# Patient Record
Sex: Female | Born: 1935 | Race: White | Hispanic: No | Marital: Married | Smoking: Never smoker
Health system: Southern US, Community
[De-identification: ages and names within clinical notes are randomized; demographics above are authoritative.]

## PROBLEM LIST (undated history)

## (undated) DIAGNOSIS — N186 End stage renal disease: Secondary | ICD-10-CM

## (undated) DIAGNOSIS — D649 Anemia, unspecified: Secondary | ICD-10-CM

## (undated) DIAGNOSIS — E119 Type 2 diabetes mellitus without complications: Secondary | ICD-10-CM

## (undated) DIAGNOSIS — I10 Essential (primary) hypertension: Secondary | ICD-10-CM

## (undated) DIAGNOSIS — I499 Cardiac arrhythmia, unspecified: Secondary | ICD-10-CM

## (undated) DIAGNOSIS — C4490 Unspecified malignant neoplasm of skin, unspecified: Secondary | ICD-10-CM

## (undated) DIAGNOSIS — J45909 Unspecified asthma, uncomplicated: Secondary | ICD-10-CM

## (undated) DIAGNOSIS — K219 Gastro-esophageal reflux disease without esophagitis: Secondary | ICD-10-CM

## (undated) DIAGNOSIS — I509 Heart failure, unspecified: Secondary | ICD-10-CM

## (undated) DIAGNOSIS — N289 Disorder of kidney and ureter, unspecified: Secondary | ICD-10-CM

## (undated) DIAGNOSIS — R600 Localized edema: Secondary | ICD-10-CM

## (undated) DIAGNOSIS — I4891 Unspecified atrial fibrillation: Secondary | ICD-10-CM

## (undated) DIAGNOSIS — M1990 Unspecified osteoarthritis, unspecified site: Secondary | ICD-10-CM

## (undated) DIAGNOSIS — G250 Essential tremor: Secondary | ICD-10-CM

## (undated) DIAGNOSIS — G4733 Obstructive sleep apnea (adult) (pediatric): Secondary | ICD-10-CM

## (undated) HISTORY — PX: NEUROPLASTY / TRANSPOSITION MEDIAN NERVE AT CARPAL TUNNEL BILATERAL: SUR894

## (undated) HISTORY — PX: FEMUR FRACTURE SURGERY: SHX633

## (undated) HISTORY — PX: TUBAL LIGATION: SHX77

## (undated) HISTORY — PX: JOINT REPLACEMENT: SHX530

## (undated) HISTORY — PX: ANKLE FRACTURE SURGERY: SHX122

## (undated) HISTORY — PX: FRACTURE SURGERY: SHX138

## (undated) HISTORY — PX: VASCULAR SURGERY: SHX849

## (undated) HISTORY — PX: KNEE ARTHROPLASTY: SHX992

## (undated) HISTORY — PX: CHOLECYSTECTOMY: SHX55

## (undated) HISTORY — PX: ABDOMINAL HYSTERECTOMY: SHX81

---

## 1998-08-22 ENCOUNTER — Ambulatory Visit: Admission: RE | Admit: 1998-08-22 | Discharge: 1998-08-22 | Payer: Self-pay | Admitting: Pulmonary Disease

## 1999-02-07 ENCOUNTER — Ambulatory Visit: Admission: RE | Admit: 1999-02-07 | Discharge: 1999-02-07 | Payer: Self-pay | Admitting: Pulmonary Disease

## 1999-09-09 ENCOUNTER — Encounter: Payer: Self-pay | Admitting: Orthopedic Surgery

## 1999-09-14 ENCOUNTER — Inpatient Hospital Stay (HOSPITAL_COMMUNITY): Admission: RE | Admit: 1999-09-14 | Discharge: 1999-09-18 | Payer: Self-pay | Admitting: Orthopedic Surgery

## 1999-09-14 ENCOUNTER — Encounter: Payer: Self-pay | Admitting: Orthopedic Surgery

## 1999-09-18 ENCOUNTER — Inpatient Hospital Stay (HOSPITAL_COMMUNITY)
Admission: RE | Admit: 1999-09-18 | Discharge: 1999-09-24 | Payer: Self-pay | Admitting: Physical Medicine & Rehabilitation

## 1999-10-10 ENCOUNTER — Inpatient Hospital Stay (HOSPITAL_COMMUNITY): Admission: EM | Admit: 1999-10-10 | Discharge: 1999-10-12 | Payer: Self-pay | Admitting: Emergency Medicine

## 2000-09-13 ENCOUNTER — Ambulatory Visit (HOSPITAL_COMMUNITY): Admission: RE | Admit: 2000-09-13 | Discharge: 2000-09-13 | Payer: Self-pay | Admitting: Neurosurgery

## 2000-09-13 ENCOUNTER — Encounter: Payer: Self-pay | Admitting: Neurosurgery

## 2000-09-20 ENCOUNTER — Encounter: Payer: Self-pay | Admitting: Neurosurgery

## 2000-09-20 ENCOUNTER — Ambulatory Visit (HOSPITAL_COMMUNITY): Admission: RE | Admit: 2000-09-20 | Discharge: 2000-09-20 | Payer: Self-pay | Admitting: Neurosurgery

## 2000-09-29 ENCOUNTER — Ambulatory Visit (HOSPITAL_COMMUNITY): Admission: RE | Admit: 2000-09-29 | Discharge: 2000-09-29 | Payer: Self-pay | Admitting: Interventional Radiology

## 2000-10-04 ENCOUNTER — Inpatient Hospital Stay (HOSPITAL_COMMUNITY): Admission: RE | Admit: 2000-10-04 | Discharge: 2000-10-10 | Payer: Self-pay | Admitting: Neurosurgery

## 2000-10-06 ENCOUNTER — Encounter: Payer: Self-pay | Admitting: Neurosurgery

## 2004-03-15 ENCOUNTER — Other Ambulatory Visit: Payer: Self-pay

## 2004-12-31 ENCOUNTER — Ambulatory Visit: Payer: Self-pay | Admitting: Gastroenterology

## 2006-03-24 ENCOUNTER — Ambulatory Visit: Payer: Self-pay | Admitting: Internal Medicine

## 2006-06-01 ENCOUNTER — Ambulatory Visit: Payer: Self-pay | Admitting: Internal Medicine

## 2007-03-27 ENCOUNTER — Ambulatory Visit: Payer: Self-pay | Admitting: Internal Medicine

## 2008-03-28 ENCOUNTER — Ambulatory Visit: Payer: Self-pay | Admitting: Internal Medicine

## 2008-05-01 ENCOUNTER — Ambulatory Visit: Payer: Self-pay

## 2009-02-27 ENCOUNTER — Ambulatory Visit: Payer: Self-pay | Admitting: Nephrology

## 2009-03-31 ENCOUNTER — Ambulatory Visit: Payer: Self-pay | Admitting: Internal Medicine

## 2009-05-01 ENCOUNTER — Ambulatory Visit: Payer: Self-pay | Admitting: Vascular Surgery

## 2009-05-01 ENCOUNTER — Ambulatory Visit: Payer: Self-pay | Admitting: Cardiology

## 2009-05-14 ENCOUNTER — Ambulatory Visit: Payer: Self-pay | Admitting: Vascular Surgery

## 2009-05-19 ENCOUNTER — Emergency Department: Payer: Self-pay | Admitting: Emergency Medicine

## 2009-11-17 ENCOUNTER — Ambulatory Visit: Payer: Self-pay | Admitting: Vascular Surgery

## 2009-12-10 ENCOUNTER — Ambulatory Visit: Payer: Self-pay | Admitting: Ophthalmology

## 2010-04-02 ENCOUNTER — Ambulatory Visit: Payer: Self-pay | Admitting: Internal Medicine

## 2010-09-21 ENCOUNTER — Inpatient Hospital Stay: Payer: Self-pay | Admitting: Internal Medicine

## 2010-12-03 ENCOUNTER — Ambulatory Visit: Payer: Self-pay | Admitting: Internal Medicine

## 2011-04-08 ENCOUNTER — Ambulatory Visit: Payer: Self-pay | Admitting: Internal Medicine

## 2011-06-07 ENCOUNTER — Ambulatory Visit: Payer: Self-pay | Admitting: Vascular Surgery

## 2011-06-14 ENCOUNTER — Ambulatory Visit: Payer: Self-pay | Admitting: Vascular Surgery

## 2011-06-15 ENCOUNTER — Ambulatory Visit: Payer: Self-pay | Admitting: Vascular Surgery

## 2011-07-16 ENCOUNTER — Ambulatory Visit: Payer: Self-pay | Admitting: Vascular Surgery

## 2012-04-11 ENCOUNTER — Ambulatory Visit: Payer: Self-pay | Admitting: Internal Medicine

## 2012-04-12 ENCOUNTER — Ambulatory Visit: Payer: Self-pay | Admitting: Internal Medicine

## 2012-06-19 ENCOUNTER — Ambulatory Visit: Payer: Self-pay | Admitting: Vascular Surgery

## 2012-09-08 ENCOUNTER — Observation Stay: Payer: Self-pay | Admitting: Internal Medicine

## 2012-09-08 LAB — COMPREHENSIVE METABOLIC PANEL
Albumin: 3.4 g/dL (ref 3.4–5.0)
Alkaline Phosphatase: 147 U/L — ABNORMAL HIGH (ref 50–136)
Anion Gap: 7 (ref 7–16)
BUN: 36 mg/dL — ABNORMAL HIGH (ref 7–18)
Bilirubin,Total: 1 mg/dL (ref 0.2–1.0)
Calcium, Total: 8.9 mg/dL (ref 8.5–10.1)
Chloride: 103 mmol/L (ref 98–107)
Co2: 30 mmol/L (ref 21–32)
Creatinine: 6.18 mg/dL — ABNORMAL HIGH (ref 0.60–1.30)
EGFR (African American): 7 — ABNORMAL LOW
EGFR (Non-African Amer.): 6 — ABNORMAL LOW
Glucose: 189 mg/dL — ABNORMAL HIGH (ref 65–99)
Osmolality: 293 (ref 275–301)
Potassium: 4.9 mmol/L (ref 3.5–5.1)
SGOT(AST): 16 U/L (ref 15–37)
SGPT (ALT): 17 U/L (ref 12–78)
Sodium: 140 mmol/L (ref 136–145)
Total Protein: 7.1 g/dL (ref 6.4–8.2)

## 2012-09-08 LAB — CBC
HCT: 30.9 % — ABNORMAL LOW (ref 35.0–47.0)
HGB: 10.3 g/dL — ABNORMAL LOW (ref 12.0–16.0)
MCH: 36.1 pg — ABNORMAL HIGH (ref 26.0–34.0)
MCHC: 33.2 g/dL (ref 32.0–36.0)
MCV: 109 fL — ABNORMAL HIGH (ref 80–100)
Platelet: 183 10*3/uL (ref 150–440)
RBC: 2.85 10*6/uL — ABNORMAL LOW (ref 3.80–5.20)
RDW: 14.1 % (ref 11.5–14.5)
WBC: 12.7 10*3/uL — ABNORMAL HIGH (ref 3.6–11.0)

## 2012-09-08 LAB — TROPONIN I
Troponin-I: 0.02 ng/mL
Troponin-I: 0.03 ng/mL

## 2012-09-08 LAB — CK TOTAL AND CKMB (NOT AT ARMC)
CK, Total: 35 U/L (ref 21–215)
CK, Total: 31 U/L (ref 21–215)
CK-MB: 0.7 ng/mL (ref 0.5–3.6)
CK-MB: 0.7 ng/mL (ref 0.5–3.6)

## 2012-09-09 LAB — LIPID PANEL
Cholesterol: 108 mg/dL (ref 0–200)
HDL Cholesterol: 29 mg/dL — ABNORMAL LOW (ref 40–60)
Ldl Cholesterol, Calc: 65 mg/dL (ref 0–100)
Triglycerides: 71 mg/dL (ref 0–200)
VLDL Cholesterol, Calc: 14 mg/dL (ref 5–40)

## 2012-09-09 LAB — CBC WITH DIFFERENTIAL/PLATELET
Basophil #: 0.1 10*3/uL (ref 0.0–0.1)
HCT: 29.1 % — ABNORMAL LOW (ref 35.0–47.0)
HGB: 10 g/dL — ABNORMAL LOW (ref 12.0–16.0)
Lymphocyte #: 3.3 10*3/uL (ref 1.0–3.6)
MCH: 37.5 pg — ABNORMAL HIGH (ref 26.0–34.0)
MCHC: 34.3 g/dL (ref 32.0–36.0)
MCV: 109 fL — ABNORMAL HIGH (ref 80–100)
Monocyte #: 0.9 x10 3/mm (ref 0.2–0.9)
Monocyte %: 7.3 %
Neutrophil #: 7.2 10*3/uL — ABNORMAL HIGH (ref 1.4–6.5)
Platelet: 164 10*3/uL (ref 150–440)
RBC: 2.67 10*6/uL — ABNORMAL LOW (ref 3.80–5.20)
RDW: 13.6 % (ref 11.5–14.5)

## 2012-09-09 LAB — COMPREHENSIVE METABOLIC PANEL
Alkaline Phosphatase: 124 U/L (ref 50–136)
BUN: 45 mg/dL — ABNORMAL HIGH (ref 7–18)
Bilirubin,Total: 1 mg/dL (ref 0.2–1.0)
Calcium, Total: 8.2 mg/dL — ABNORMAL LOW (ref 8.5–10.1)
Creatinine: 6.89 mg/dL — ABNORMAL HIGH (ref 0.60–1.30)
Glucose: 120 mg/dL — ABNORMAL HIGH (ref 65–99)
Osmolality: 288 (ref 275–301)
Potassium: 4.7 mmol/L (ref 3.5–5.1)
SGPT (ALT): 15 U/L (ref 12–78)
Sodium: 138 mmol/L (ref 136–145)
Total Protein: 6.5 g/dL (ref 6.4–8.2)

## 2012-09-09 LAB — CK TOTAL AND CKMB (NOT AT ARMC)
CK, Total: 25 U/L (ref 21–215)
CK-MB: 0.6 ng/mL (ref 0.5–3.6)

## 2012-11-07 ENCOUNTER — Ambulatory Visit: Payer: Self-pay | Admitting: Internal Medicine

## 2013-04-12 ENCOUNTER — Ambulatory Visit: Payer: Self-pay | Admitting: Internal Medicine

## 2013-09-16 ENCOUNTER — Inpatient Hospital Stay: Payer: Self-pay | Admitting: Internal Medicine

## 2013-09-16 LAB — BASIC METABOLIC PANEL
Anion Gap: 9 (ref 7–16)
Calcium, Total: 8.8 mg/dL (ref 8.5–10.1)
EGFR (African American): 5 — ABNORMAL LOW
EGFR (Non-African Amer.): 5 — ABNORMAL LOW
Glucose: 295 mg/dL — ABNORMAL HIGH (ref 65–99)
Potassium: 4 mmol/L (ref 3.5–5.1)
Sodium: 133 mmol/L — ABNORMAL LOW (ref 136–145)

## 2013-09-16 LAB — CBC
HGB: 10.9 g/dL — ABNORMAL LOW (ref 12.0–16.0)
MCHC: 35.4 g/dL (ref 32.0–36.0)
MCV: 110 fL — ABNORMAL HIGH (ref 80–100)
Platelet: 164 10*3/uL (ref 150–440)
RDW: 14.6 % — ABNORMAL HIGH (ref 11.5–14.5)
WBC: 9.2 10*3/uL (ref 3.6–11.0)

## 2013-09-16 LAB — TSH: Thyroid Stimulating Horm: 1.21 u[IU]/mL

## 2013-09-16 LAB — PROTIME-INR
INR: 1
Prothrombin Time: 13.6 secs (ref 11.5–14.7)

## 2013-09-16 LAB — MAGNESIUM: Magnesium: 1.6 mg/dL — ABNORMAL LOW

## 2013-09-16 LAB — CK TOTAL AND CKMB (NOT AT ARMC)
CK, Total: 44 U/L (ref 21–215)
CK-MB: 0.6 ng/mL (ref 0.5–3.6)
CK-MB: 1.2 ng/mL (ref 0.5–3.6)

## 2013-09-16 LAB — TROPONIN I: Troponin-I: 0.02 ng/mL

## 2013-09-17 LAB — COMPREHENSIVE METABOLIC PANEL
Alkaline Phosphatase: 121 U/L (ref 50–136)
Anion Gap: 11 (ref 7–16)
BUN: 47 mg/dL — ABNORMAL HIGH (ref 7–18)
Bilirubin,Total: 1 mg/dL (ref 0.2–1.0)
Calcium, Total: 8.9 mg/dL (ref 8.5–10.1)
Chloride: 96 mmol/L — ABNORMAL LOW (ref 98–107)
EGFR (African American): 5 — ABNORMAL LOW
Potassium: 3.9 mmol/L (ref 3.5–5.1)
SGOT(AST): 19 U/L (ref 15–37)
SGPT (ALT): 17 U/L (ref 12–78)
Sodium: 133 mmol/L — ABNORMAL LOW (ref 136–145)
Total Protein: 6.6 g/dL (ref 6.4–8.2)

## 2013-09-17 LAB — CBC WITH DIFFERENTIAL/PLATELET
Basophil #: 0.1 10*3/uL (ref 0.0–0.1)
Eosinophil #: 0.4 10*3/uL (ref 0.0–0.7)
HCT: 30.9 % — ABNORMAL LOW (ref 35.0–47.0)
HGB: 10.8 g/dL — ABNORMAL LOW (ref 12.0–16.0)
Lymphocyte #: 2.5 10*3/uL (ref 1.0–3.6)
Lymphocyte %: 18.9 %
MCH: 38.6 pg — ABNORMAL HIGH (ref 26.0–34.0)
MCHC: 34.9 g/dL (ref 32.0–36.0)
Monocyte %: 7.9 %
Neutrophil %: 69.6 %
Platelet: 186 10*3/uL (ref 150–440)
RBC: 2.79 10*6/uL — ABNORMAL LOW (ref 3.80–5.20)
WBC: 13.1 10*3/uL — ABNORMAL HIGH (ref 3.6–11.0)

## 2013-09-17 LAB — PHOSPHORUS: Phosphorus: 4.2 mg/dL (ref 2.5–4.9)

## 2013-11-25 LAB — COMPREHENSIVE METABOLIC PANEL
Albumin: 3.2 g/dL — ABNORMAL LOW (ref 3.4–5.0)
Alkaline Phosphatase: 102 U/L
Bilirubin,Total: 1.2 mg/dL — ABNORMAL HIGH (ref 0.2–1.0)
Co2: 28 mmol/L (ref 21–32)
Creatinine: 7.68 mg/dL — ABNORMAL HIGH (ref 0.60–1.30)
EGFR (African American): 5 — ABNORMAL LOW
Osmolality: 284 (ref 275–301)
Potassium: 3.7 mmol/L (ref 3.5–5.1)
SGOT(AST): 16 U/L (ref 15–37)
SGPT (ALT): 17 U/L (ref 12–78)
Total Protein: 7.2 g/dL (ref 6.4–8.2)

## 2013-11-25 LAB — CBC
MCH: 35 pg — ABNORMAL HIGH (ref 26.0–34.0)
MCHC: 31.6 g/dL — ABNORMAL LOW (ref 32.0–36.0)
MCV: 111 fL — ABNORMAL HIGH (ref 80–100)
Platelet: 159 10*3/uL (ref 150–440)

## 2013-11-25 LAB — PROTIME-INR
INR: 2.1
Prothrombin Time: 23.4 secs — ABNORMAL HIGH (ref 11.5–14.7)

## 2013-11-25 LAB — TROPONIN I: Troponin-I: 0.05 ng/mL

## 2013-11-26 LAB — CBC WITH DIFFERENTIAL/PLATELET
Basophil %: 0.9 %
Eosinophil %: 2.7 %
HCT: 29.4 % — ABNORMAL LOW (ref 35.0–47.0)
HGB: 9.9 g/dL — ABNORMAL LOW (ref 12.0–16.0)
Lymphocyte #: 2.3 10*3/uL (ref 1.0–3.6)
Lymphocyte %: 23.9 %
MCH: 37.6 pg — ABNORMAL HIGH (ref 26.0–34.0)
MCHC: 33.7 g/dL (ref 32.0–36.0)
MCV: 112 fL — ABNORMAL HIGH (ref 80–100)
Monocyte #: 0.8 x10 3/mm (ref 0.2–0.9)
Monocyte %: 8.7 %
Neutrophil %: 63.8 %
RBC: 2.63 10*6/uL — ABNORMAL LOW (ref 3.80–5.20)

## 2013-11-26 LAB — COMPREHENSIVE METABOLIC PANEL
Albumin: 3 g/dL — ABNORMAL LOW (ref 3.4–5.0)
Alkaline Phosphatase: 98 U/L
Anion Gap: 11 (ref 7–16)
BUN: 46 mg/dL — ABNORMAL HIGH (ref 7–18)
Chloride: 97 mmol/L — ABNORMAL LOW (ref 98–107)
Co2: 25 mmol/L (ref 21–32)
Creatinine: 8.44 mg/dL — ABNORMAL HIGH (ref 0.60–1.30)
EGFR (African American): 5 — ABNORMAL LOW
EGFR (Non-African Amer.): 4 — ABNORMAL LOW
SGOT(AST): 17 U/L (ref 15–37)
SGPT (ALT): 14 U/L (ref 12–78)
Sodium: 133 mmol/L — ABNORMAL LOW (ref 136–145)
Total Protein: 6.7 g/dL (ref 6.4–8.2)

## 2013-11-26 LAB — PHOSPHORUS: Phosphorus: 7.2 mg/dL — ABNORMAL HIGH (ref 2.5–4.9)

## 2013-11-26 LAB — PROTIME-INR: INR: 2.1

## 2013-11-27 ENCOUNTER — Inpatient Hospital Stay: Payer: Self-pay | Admitting: Internal Medicine

## 2013-11-27 LAB — BASIC METABOLIC PANEL
BUN: 29 mg/dL — ABNORMAL HIGH (ref 7–18)
Chloride: 95 mmol/L — ABNORMAL LOW (ref 98–107)
Creatinine: 5.76 mg/dL — ABNORMAL HIGH (ref 0.60–1.30)
EGFR (Non-African Amer.): 7 — ABNORMAL LOW
Glucose: 158 mg/dL — ABNORMAL HIGH (ref 65–99)
Osmolality: 274 (ref 275–301)
Potassium: 4.3 mmol/L (ref 3.5–5.1)

## 2013-11-27 LAB — CBC WITH DIFFERENTIAL/PLATELET
Basophil #: 0 10*3/uL (ref 0.0–0.1)
Eosinophil #: 0.2 10*3/uL (ref 0.0–0.7)
Eosinophil %: 1.6 %
HCT: 31.8 % — ABNORMAL LOW (ref 35.0–47.0)
HGB: 10.8 g/dL — ABNORMAL LOW (ref 12.0–16.0)
Lymphocyte %: 17.3 %
MCH: 37.9 pg — ABNORMAL HIGH (ref 26.0–34.0)
MCHC: 34.1 g/dL (ref 32.0–36.0)
Monocyte #: 0.8 x10 3/mm (ref 0.2–0.9)
Monocyte %: 7.9 %
RDW: 15.3 % — ABNORMAL HIGH (ref 11.5–14.5)
WBC: 10.2 10*3/uL (ref 3.6–11.0)

## 2013-11-28 LAB — PROTIME-INR
INR: 2.6
Prothrombin Time: 27.2 secs — ABNORMAL HIGH (ref 11.5–14.7)

## 2013-11-29 LAB — PROTIME-INR: Prothrombin Time: 26.6 secs — ABNORMAL HIGH (ref 11.5–14.7)

## 2013-11-30 LAB — PROTIME-INR
INR: 2.8
Prothrombin Time: 28.9 secs — ABNORMAL HIGH (ref 11.5–14.7)

## 2013-11-30 LAB — PHOSPHORUS: Phosphorus: 4.1 mg/dL (ref 2.5–4.9)

## 2013-12-07 ENCOUNTER — Inpatient Hospital Stay: Payer: Self-pay | Admitting: Family Medicine

## 2013-12-07 LAB — COMPREHENSIVE METABOLIC PANEL
Albumin: 2.9 g/dL — ABNORMAL LOW (ref 3.4–5.0)
Anion Gap: 6 — ABNORMAL LOW (ref 7–16)
BUN: 17 mg/dL (ref 7–18)
Bilirubin,Total: 1.1 mg/dL — ABNORMAL HIGH (ref 0.2–1.0)
Calcium, Total: 7.9 mg/dL — ABNORMAL LOW (ref 8.5–10.1)
Chloride: 98 mmol/L (ref 98–107)
EGFR (Non-African Amer.): 9 — ABNORMAL LOW
Glucose: 155 mg/dL — ABNORMAL HIGH (ref 65–99)
Osmolality: 275 (ref 275–301)
Potassium: 4 mmol/L (ref 3.5–5.1)
SGOT(AST): 23 U/L (ref 15–37)
SGPT (ALT): 24 U/L (ref 12–78)
Sodium: 135 mmol/L — ABNORMAL LOW (ref 136–145)
Total Protein: 7 g/dL (ref 6.4–8.2)

## 2013-12-07 LAB — CBC
MCHC: 32.8 g/dL (ref 32.0–36.0)
Platelet: 123 10*3/uL — ABNORMAL LOW (ref 150–440)
RBC: 2.98 10*6/uL — ABNORMAL LOW (ref 3.80–5.20)
RDW: 16.8 % — ABNORMAL HIGH (ref 11.5–14.5)
WBC: 9.9 10*3/uL (ref 3.6–11.0)

## 2013-12-07 LAB — RAPID INFLUENZA A&B ANTIGENS

## 2013-12-07 LAB — TROPONIN I: Troponin-I: 0.05 ng/mL

## 2013-12-07 LAB — CK TOTAL AND CKMB (NOT AT ARMC)
CK, Total: 51 U/L (ref 21–215)
CK-MB: 1.2 ng/mL (ref 0.5–3.6)

## 2013-12-07 LAB — PROTIME-INR: INR: 1.9

## 2013-12-08 DIAGNOSIS — I369 Nonrheumatic tricuspid valve disorder, unspecified: Secondary | ICD-10-CM

## 2013-12-08 LAB — COMPREHENSIVE METABOLIC PANEL
BUN: 27 mg/dL — ABNORMAL HIGH (ref 7–18)
Calcium, Total: 8.3 mg/dL — ABNORMAL LOW (ref 8.5–10.1)
Chloride: 99 mmol/L (ref 98–107)
Co2: 31 mmol/L (ref 21–32)
Creatinine: 5.52 mg/dL — ABNORMAL HIGH (ref 0.60–1.30)
EGFR (Non-African Amer.): 7 — ABNORMAL LOW
Potassium: 4.4 mmol/L (ref 3.5–5.1)
SGPT (ALT): 20 U/L (ref 12–78)
Sodium: 134 mmol/L — ABNORMAL LOW (ref 136–145)
Total Protein: 6.5 g/dL (ref 6.4–8.2)

## 2013-12-08 LAB — CBC WITH DIFFERENTIAL/PLATELET
HCT: 31.8 % — ABNORMAL LOW (ref 35.0–47.0)
MCH: 37.8 pg — ABNORMAL HIGH (ref 26.0–34.0)
MCHC: 33.1 g/dL (ref 32.0–36.0)
MCV: 114 fL — ABNORMAL HIGH (ref 80–100)
Monocyte #: 1.2 x10 3/mm — ABNORMAL HIGH (ref 0.2–0.9)
Monocyte %: 14.1 %
Neutrophil #: 6.1 10*3/uL (ref 1.4–6.5)
Platelet: 102 10*3/uL — ABNORMAL LOW (ref 150–440)
WBC: 8.8 10*3/uL (ref 3.6–11.0)

## 2013-12-08 LAB — PROTIME-INR
INR: 2
Prothrombin Time: 22.5 secs — ABNORMAL HIGH (ref 11.5–14.7)

## 2013-12-09 LAB — PROTIME-INR: Prothrombin Time: 23.3 secs — ABNORMAL HIGH (ref 11.5–14.7)

## 2013-12-10 LAB — CBC WITH DIFFERENTIAL/PLATELET
Basophil #: 0 10*3/uL (ref 0.0–0.1)
HCT: 31.5 % — ABNORMAL LOW (ref 35.0–47.0)
Lymphocyte #: 0.8 10*3/uL — ABNORMAL LOW (ref 1.0–3.6)
MCHC: 33.1 g/dL (ref 32.0–36.0)
Monocyte #: 0.8 x10 3/mm (ref 0.2–0.9)
Neutrophil #: 6.3 10*3/uL (ref 1.4–6.5)
Neutrophil %: 78.1 %
RBC: 2.8 10*6/uL — ABNORMAL LOW (ref 3.80–5.20)
WBC: 8.1 10*3/uL (ref 3.6–11.0)

## 2013-12-10 LAB — PROTIME-INR
INR: 1.7
Prothrombin Time: 19.7 secs — ABNORMAL HIGH (ref 11.5–14.7)

## 2013-12-10 LAB — RENAL FUNCTION PANEL
Albumin: 2.9 g/dL — ABNORMAL LOW (ref 3.4–5.0)
Anion Gap: 5 — ABNORMAL LOW (ref 7–16)
BUN: 39 mg/dL — ABNORMAL HIGH (ref 7–18)
Creatinine: 6.72 mg/dL — ABNORMAL HIGH (ref 0.60–1.30)
Glucose: 183 mg/dL — ABNORMAL HIGH (ref 65–99)
Phosphorus: 3.9 mg/dL (ref 2.5–4.9)
Potassium: 4.4 mmol/L (ref 3.5–5.1)
Sodium: 131 mmol/L — ABNORMAL LOW (ref 136–145)

## 2013-12-11 LAB — URINALYSIS, COMPLETE
Glucose,UR: NEGATIVE mg/dL (ref 0–75)
Nitrite: NEGATIVE
Ph: 7 (ref 4.5–8.0)
Protein: 100
RBC,UR: 7 /HPF (ref 0–5)
Specific Gravity: 1.013 (ref 1.003–1.030)
Squamous Epithelial: 33

## 2013-12-12 LAB — CULTURE, BLOOD (SINGLE)

## 2013-12-12 LAB — HEMOGLOBIN: HGB: 10 g/dL — ABNORMAL LOW (ref 12.0–16.0)

## 2014-02-25 ENCOUNTER — Emergency Department: Payer: Self-pay | Admitting: Emergency Medicine

## 2014-06-13 ENCOUNTER — Ambulatory Visit: Payer: Self-pay | Admitting: Vascular Surgery

## 2015-03-04 ENCOUNTER — Emergency Department: Payer: Self-pay | Admitting: Emergency Medicine

## 2015-03-05 ENCOUNTER — Inpatient Hospital Stay: Payer: Self-pay | Admitting: Internal Medicine

## 2015-03-06 LAB — BASIC METABOLIC PANEL
ANION GAP: 9 (ref 7–16)
ANION GAP: 10 (ref 7–16)
BUN: 17 mg/dL
BUN: 20 mg/dL
CALCIUM: 7.1 mg/dL — AB
CALCIUM: 7.1 mg/dL — AB
Chloride: 91 mmol/L — ABNORMAL LOW
Chloride: 91 mmol/L — ABNORMAL LOW
Co2: 30 mmol/L
Co2: 31 mmol/L
Creatinine: 4.25 mg/dL — ABNORMAL HIGH
Creatinine: 4.69 mg/dL — ABNORMAL HIGH
EGFR (African American): 11 — ABNORMAL LOW
EGFR (African American): 10 — ABNORMAL LOW
GFR CALC NON AF AMER: 8 — AB
GFR CALC NON AF AMER: 9 — AB
GLUCOSE: 143 mg/dL — AB
Glucose: 157 mg/dL — ABNORMAL HIGH
POTASSIUM: 4.4 mmol/L
POTASSIUM: 4 mmol/L
SODIUM: 131 mmol/L — AB
SODIUM: 131 mmol/L — AB

## 2015-03-06 LAB — CBC WITH DIFFERENTIAL/PLATELET
BASOS ABS: 0.1 10*3/uL (ref 0.0–0.1)
BASOS PCT: 0.4 %
Basophil #: 0.1 10*3/uL (ref 0.0–0.1)
Basophil #: 0.1 10*3/uL (ref 0.0–0.1)
Basophil %: 0.6 %
Basophil %: 0.3 %
EOS ABS: 0.2 10*3/uL (ref 0.0–0.7)
EOS PCT: 1.2 %
Eosinophil #: 0.2 10*3/uL (ref 0.0–0.7)
Eosinophil #: 0.2 10*3/uL (ref 0.0–0.7)
Eosinophil %: 1.6 %
Eosinophil %: 2 %
HCT: 29.1 % — ABNORMAL LOW (ref 35.0–47.0)
HCT: 29.3 % — ABNORMAL LOW (ref 35.0–47.0)
HCT: 31.3 % — ABNORMAL LOW (ref 35.0–47.0)
HGB: 9.4 g/dL — ABNORMAL LOW (ref 12.0–16.0)
HGB: 10.1 g/dL — ABNORMAL LOW (ref 12.0–16.0)
HGB: 9.4 g/dL — AB (ref 12.0–16.0)
LYMPHS ABS: 1.7 10*3/uL (ref 1.0–3.6)
LYMPHS PCT: 8.7 %
LYMPHS PCT: 12.4 %
Lymphocyte #: 1.5 10*3/uL (ref 1.0–3.6)
Lymphocyte #: 1.6 10*3/uL (ref 1.0–3.6)
Lymphocyte %: 14.4 %
MCH: 33.3 pg (ref 26.0–34.0)
MCH: 32.4 pg (ref 26.0–34.0)
MCH: 33.1 pg (ref 26.0–34.0)
MCHC: 32.1 g/dL (ref 32.0–36.0)
MCHC: 32.1 g/dL (ref 32.0–36.0)
MCHC: 32.3 g/dL (ref 32.0–36.0)
MCV: 101 fL — ABNORMAL HIGH (ref 80–100)
MCV: 102 fL — ABNORMAL HIGH (ref 80–100)
MCV: 104 fL — AB (ref 80–100)
MONO ABS: 1.1 x10 3/mm — AB (ref 0.2–0.9)
MONO ABS: 1.5 x10 3/mm — AB (ref 0.2–0.9)
Monocyte #: 1.1 x10 3/mm — ABNORMAL HIGH (ref 0.2–0.9)
Monocyte %: 8.7 %
Monocyte %: 8.7 %
Monocyte %: 9.1 %
NEUTROS PCT: 74.3 %
NEUTROS PCT: 81.1 %
Neutrophil #: 13.8 10*3/uL — ABNORMAL HIGH (ref 1.4–6.5)
Neutrophil #: 9 10*3/uL — ABNORMAL HIGH (ref 1.4–6.5)
Neutrophil #: 9.6 10*3/uL — ABNORMAL HIGH (ref 1.4–6.5)
Neutrophil %: 76.5 %
PLATELETS: 159 10*3/uL (ref 150–440)
PLATELETS: 171 10*3/uL (ref 150–440)
Platelet: 160 10*3/uL (ref 150–440)
RBC: 2.83 10*6/uL — AB (ref 3.80–5.20)
RBC: 2.85 10*6/uL — ABNORMAL LOW (ref 3.80–5.20)
RBC: 3.11 10*6/uL — AB (ref 3.80–5.20)
RDW: 13.9 % (ref 11.5–14.5)
RDW: 14.3 % (ref 11.5–14.5)
RDW: 15.3 % — ABNORMAL HIGH (ref 11.5–14.5)
WBC: 12.1 10*3/uL — ABNORMAL HIGH (ref 3.6–11.0)
WBC: 12.5 10*3/uL — ABNORMAL HIGH (ref 3.6–11.0)
WBC: 17 10*3/uL — AB (ref 3.6–11.0)

## 2015-03-06 LAB — HEPATIC FUNCTION PANEL A (ARMC)
Albumin: 3 g/dL — ABNORMAL LOW
Alkaline Phosphatase: 117 U/L
BILIRUBIN TOTAL: 1.2 mg/dL
Bilirubin, Direct: 0.2 mg/dL
Indirect Bilirubin: 1
SGOT(AST): 19 U/L
SGPT (ALT): 12 U/L — ABNORMAL LOW
TOTAL PROTEIN: 6 g/dL — AB

## 2015-03-06 LAB — PROTIME-INR
INR: 1.2
Prothrombin Time: 15.5 secs — ABNORMAL HIGH

## 2015-03-07 LAB — CBC WITH DIFFERENTIAL/PLATELET
BASOS PCT: 0.5 %
Basophil #: 0.1 10*3/uL (ref 0.0–0.1)
EOS ABS: 0.3 10*3/uL (ref 0.0–0.7)
Eosinophil %: 2.2 %
HCT: 27.7 % — ABNORMAL LOW (ref 35.0–47.0)
HGB: 9.4 g/dL — ABNORMAL LOW (ref 12.0–16.0)
Lymphocyte #: 1.7 10*3/uL (ref 1.0–3.6)
Lymphocyte %: 11 %
MCH: 34.2 pg — ABNORMAL HIGH (ref 26.0–34.0)
MCHC: 34 g/dL (ref 32.0–36.0)
MCV: 101 fL — ABNORMAL HIGH (ref 80–100)
Monocyte #: 1.6 x10 3/mm — ABNORMAL HIGH (ref 0.2–0.9)
Monocyte %: 10 %
NEUTROS PCT: 76.3 %
Neutrophil #: 12 10*3/uL — ABNORMAL HIGH (ref 1.4–6.5)
Platelet: 49 10*3/uL — ABNORMAL LOW (ref 150–440)
RBC: 2.76 10*6/uL — AB (ref 3.80–5.20)
RDW: 15.7 % — ABNORMAL HIGH (ref 11.5–14.5)
WBC: 15.7 10*3/uL — ABNORMAL HIGH (ref 3.6–11.0)

## 2015-03-07 LAB — BASIC METABOLIC PANEL
Anion Gap: 12 (ref 7–16)
BUN: 31 mg/dL — AB
Calcium, Total: 6.8 mg/dL — CL
Chloride: 92 mmol/L — ABNORMAL LOW
Co2: 25 mmol/L
Creatinine: 6.15 mg/dL — ABNORMAL HIGH
EGFR (African American): 7 — ABNORMAL LOW
EGFR (Non-African Amer.): 6 — ABNORMAL LOW
GLUCOSE: 144 mg/dL — AB
Potassium: 5.1 mmol/L
Sodium: 129 mmol/L — ABNORMAL LOW

## 2015-03-07 LAB — ALBUMIN: Albumin: 2.7 g/dL — ABNORMAL LOW

## 2015-03-08 LAB — BASIC METABOLIC PANEL
ANION GAP: 6 — AB (ref 7–16)
BUN: 19 mg/dL
CALCIUM: 6.5 mg/dL — AB
CHLORIDE: 94 mmol/L — AB
Co2: 30 mmol/L
Creatinine: 4.22 mg/dL — ABNORMAL HIGH
EGFR (African American): 11 — ABNORMAL LOW
EGFR (Non-African Amer.): 9 — ABNORMAL LOW
Glucose: 174 mg/dL — ABNORMAL HIGH
Potassium: 4.4 mmol/L
SODIUM: 130 mmol/L — AB

## 2015-03-08 LAB — CBC WITH DIFFERENTIAL/PLATELET
BASOS PCT: 0.5 %
Basophil #: 0.1 10*3/uL (ref 0.0–0.1)
EOS PCT: 1.7 %
Eosinophil #: 0.2 10*3/uL (ref 0.0–0.7)
HCT: 24.1 % — AB (ref 35.0–47.0)
LYMPHS ABS: 1.7 10*3/uL (ref 1.0–3.6)
Lymphocyte %: 11.8 %
MCH: 32.5 pg (ref 26.0–34.0)
MCHC: 32 g/dL (ref 32.0–36.0)
MCV: 102 fL — AB (ref 80–100)
MONO ABS: 1.8 x10 3/mm — AB (ref 0.2–0.9)
MONOS PCT: 12.6 %
NEUTROS ABS: 10.4 10*3/uL — AB (ref 1.4–6.5)
Neutrophil %: 73.4 %
Platelet: 134 10*3/uL — ABNORMAL LOW (ref 150–440)
RBC: 2.37 10*6/uL — ABNORMAL LOW (ref 3.80–5.20)
RDW: 15.3 % — ABNORMAL HIGH (ref 11.5–14.5)
WBC: 14.1 10*3/uL — AB (ref 3.6–11.0)

## 2015-03-08 LAB — HEMOGLOBIN: HGB: 7.7 g/dL — AB (ref 12.0–16.0)

## 2015-03-09 LAB — BASIC METABOLIC PANEL
Anion Gap: 10 (ref 7–16)
BUN: 32 mg/dL — AB
CHLORIDE: 92 mmol/L — AB
CREATININE: 5.64 mg/dL — AB
Calcium, Total: 7 mg/dL — CL
Co2: 28 mmol/L
EGFR (African American): 8 — ABNORMAL LOW
EGFR (Non-African Amer.): 7 — ABNORMAL LOW
GLUCOSE: 74 mg/dL
POTASSIUM: 4.4 mmol/L
Sodium: 130 mmol/L — ABNORMAL LOW

## 2015-03-09 LAB — CBC WITH DIFFERENTIAL/PLATELET
Basophil #: 0.1 x10 3/mm 3
Basophil %: 0.4 %
Eosinophil #: 0.4 x10 3/mm 3
Eosinophil %: 2.7 %
HCT: 26.3 % — ABNORMAL LOW
HGB: 8.5 g/dL — ABNORMAL LOW
Lymphocyte %: 10.3 %
Lymphs Abs: 1.7 x10 3/mm 3
MCH: 32.7 pg
MCHC: 32.2 g/dL
MCV: 102 fL — ABNORMAL HIGH
Monocyte #: 1.9 "x10 3/mm " — ABNORMAL HIGH
Monocyte %: 11.7 %
Neutrophil #: 12.4 x10 3/mm 3 — ABNORMAL HIGH
Neutrophil %: 74.9 %
Platelet: 160 x10 3/mm 3
RBC: 2.59 X10 6/mm 3 — ABNORMAL LOW
RDW: 15.4 % — ABNORMAL HIGH
WBC: 16.6 x10 3/mm 3 — ABNORMAL HIGH

## 2015-03-10 LAB — BASIC METABOLIC PANEL
Anion Gap: 12 (ref 7–16)
Anion Gap: 13 (ref 7–16)
BUN: 14 mg/dL
BUN: 41 mg/dL — AB
CALCIUM: 7.6 mg/dL — AB
CHLORIDE: 91 mmol/L — AB
CREATININE: 6.65 mg/dL — AB
Calcium, Total: 7.6 mg/dL — ABNORMAL LOW
Chloride: 89 mmol/L — ABNORMAL LOW
Co2: 31 mmol/L
Co2: 26 mmol/L
Creatinine: 3.67 mg/dL — ABNORMAL HIGH
EGFR (African American): 13 — ABNORMAL LOW
EGFR (African American): 6 — ABNORMAL LOW
EGFR (Non-African Amer.): 5 — ABNORMAL LOW
GFR CALC NON AF AMER: 11 — AB
Glucose: 237 mg/dL — ABNORMAL HIGH
Glucose: 85 mg/dL
POTASSIUM: 4.9 mmol/L
Potassium: 3.8 mmol/L
Sodium: 132 mmol/L — ABNORMAL LOW
Sodium: 130 mmol/L — ABNORMAL LOW

## 2015-03-10 LAB — CBC WITH DIFFERENTIAL/PLATELET
BASOS PCT: 0.3 %
Basophil #: 0.1 10*3/uL (ref 0.0–0.1)
Basophil #: 0 10*3/uL (ref 0.0–0.1)
Basophil %: 0.4 %
EOS ABS: 0.3 10*3/uL (ref 0.0–0.7)
Eosinophil #: 0.2 10*3/uL (ref 0.0–0.7)
Eosinophil %: 1.5 %
Eosinophil %: 2.1 %
HCT: 33.6 % — ABNORMAL LOW (ref 35.0–47.0)
HCT: 26.5 % — ABNORMAL LOW (ref 35.0–47.0)
HGB: 11.2 g/dL — ABNORMAL LOW (ref 12.0–16.0)
HGB: 8.6 g/dL — AB (ref 12.0–16.0)
LYMPHS ABS: 1.2 10*3/uL (ref 1.0–3.6)
Lymphocyte #: 1.6 10*3/uL (ref 1.0–3.6)
Lymphocyte %: 9.6 %
Lymphocyte %: 8 %
MCH: 34.3 pg — ABNORMAL HIGH (ref 26.0–34.0)
MCH: 32.9 pg (ref 26.0–34.0)
MCHC: 33.3 g/dL (ref 32.0–36.0)
MCHC: 32.4 g/dL (ref 32.0–36.0)
MCV: 103 fL — ABNORMAL HIGH (ref 80–100)
MCV: 102 fL — ABNORMAL HIGH (ref 80–100)
MONO ABS: 1.4 x10 3/mm — AB (ref 0.2–0.9)
Monocyte #: 1.6 x10 3/mm — ABNORMAL HIGH (ref 0.2–0.9)
Monocyte %: 8.2 %
Monocyte %: 10.7 %
NEUTROS ABS: 12 10*3/uL — AB (ref 1.4–6.5)
Neutrophil #: 13.2 10*3/uL — ABNORMAL HIGH (ref 1.4–6.5)
Neutrophil %: 80.3 %
Neutrophil %: 78.9 %
PLATELETS: 190 10*3/uL (ref 150–440)
Platelet: 183 10*3/uL (ref 150–440)
RBC: 3.27 10*6/uL — ABNORMAL LOW (ref 3.80–5.20)
RBC: 2.61 10*6/uL — ABNORMAL LOW (ref 3.80–5.20)
RDW: 14 % (ref 11.5–14.5)
RDW: 15.3 % — ABNORMAL HIGH (ref 11.5–14.5)
WBC: 16.5 10*3/uL — AB (ref 3.6–11.0)
WBC: 15.2 10*3/uL — AB (ref 3.6–11.0)

## 2015-03-10 LAB — POTASSIUM: Potassium: 5.3 mmol/L — ABNORMAL HIGH

## 2015-03-10 LAB — HEMOGLOBIN: HGB: 8.6 g/dL — ABNORMAL LOW (ref 12.0–16.0)

## 2015-03-10 LAB — PROTIME-INR
INR: 1.1
Prothrombin Time: 14.4 secs

## 2015-03-10 LAB — PHOSPHORUS: PHOSPHORUS: 5.5 mg/dL — AB

## 2015-04-01 NOTE — H&P (Signed)
PATIENT NAME:  [name], [name] MR#:  [id number] DATE OF BIRTH:  Jul 12, 1934  DATE OF ADMISSION:  09/08/2012  PRIMARY CARE PHYSICIAN: Dr. Emily Filbert   HISTORY OF PRESENT ILLNESS: Patient is a 81 year old Caucasian female with history of diabetes, hypertension, end-stage renal disease who was on the way to hemodialysis center when she started having chest pain in the truck while she was riding. Pain was described as tightening across the chest, discomfort, lasting approximately 25 minutes until she got to hemodialysis and then it eased off. She stated that both arms became very numb. She felt presyncopal. She felt as if she was dying. She started praying because she was thinking that she was on the way out. Pain was 10/10 by intensity. It did not radiate, just stayed across the chest, except of as mentioned above, she had arm numbness. On arrival to hemodialysis she was checked by her nurse and was sent to Emergency Room for further evaluation. In the Emergency Room her oxygenation is normal at 99% on room air and her pain is gone. Hospitalist services were contacted for admission.   PAST MEDICAL HISTORY:  1. History of diabetes mellitus, type 2, insulin-dependent. 2. Recurrent vertigo. 3. Bilateral knee pains. 4. Obstructive sleep apnea, on CPAP at home. 5. Essential tremor. 6. Sleep apnea. 7. Hypertension. 8. Allergic rhinitis. 9. Bilateral carpal tunnel syndrome. 10. Colonic ectasias. 11. End-stage renal disease, hemodialysis on Mondays, Wednesdays, Fridays.   PAST SURGICAL HISTORY:  1. Right ankle surgery. 2. Right buttock abscess.  3. Bilateral knee replacements. 4. Cholecystectomy. 5. Hysterectomy. 6. Bilateral tubal ligation.  7. Carpal tunnel surgery.  8. AV fistula in left upper extremity.   MEDICATIONS: According to medical records patient is on:  1. Allopurinol 100 mg p.o. daily. 2. Dialyvite once daily. 3. Humalog mix 75/25, 30 units twice daily. 4. Meclizine 25 mg 3 times  daily as needed.  5. Propranolol 120 mg p.o. once daily. 6. Rena-Vite once daily. 7. Renvela 800 mg 2 tablets t.i.d.  ALLERGIES: Patient is allergic to penicillin.   SOCIAL HISTORY: Patient is married. Lives in [place] with her husband. Used to work in [employer]. Denies tobacco, alcohol or drug abuse.   FAMILY HISTORY: Patient's mother died of GI malignancy. Patient's father died of cerebrovascular accident.   REVIEW OF SYSTEMS: CONSTITUTIONAL: Positive for some chills earlier today, some fatigue and weakness, felt as if she was going out, some sinus congestion intermittently, some chest pains, arrhythmias, dyspnea on exertion, constipation, also urine output is minimal. She tells me that she needs to go to the bathroom a few times a day, however, just has a few drips. She uses cane to walk. Otherwise, she denies any fevers, pains, weight loss or gain. EYES: In regards to eyes, denies any blurry vision, double vision, glaucoma, cataracts. ENT: Denies any tinnitus, allergies, epistaxis, sinus pain, dentures, difficulty swallowing. RESPIRATORY: Denies any cough, wheezes, asthma, chronic obstructive pulmonary disease. CARDIOVASCULAR: Denies any orthopnea, edema, palpitations or syncope. GASTROINTESTINAL: Denies nausea, vomiting, diarrhea, rectal bleeding, change in bowel habits. GENITOURINARY: Denies dysuria, hematuria, frequency, incontinence. ENDOCRINOLOGY: Denies any polydipsia, nocturia, thyroid problems, heat or cold intolerance, or thirst. HEME: Denies any anemia, easy bruising, bleeding, swollen glands. SKIN: Denies any acne, rash, lesions, change in moles. MUSCULOSKELETAL: Denies arthritis, cramps, swelling, gout. NEUROLOGICAL: Denies any numbness, epilepsy, tremor. PSYCH: Denies anxiety, insomnia or depression.    PHYSICAL EXAMINATION: VITAL SIGNS: On arrival to the hospital: Temperature 98.5, pulse 70, respiration rate 24, blood pressure 115/62, saturation 99%  on room air. During my evaluation her  heart rate is 72, blood pressure 145/76, saturation 100% on room air, respiration rate 21.   GENERAL: This is a very obese Caucasian female in no significant distress laying on the stretcher.   HEENT: Pupils equal, reactive to light. Extraocular movements are intact. No icterus or conjunctivitis. Has normal hearing. No pharyngeal erythema. Mucosa is moist.   NECK: Neck did not reveal any masses. Supple, nontender. Thyroid not enlarged. No adenopathy. No JVD or carotid bruits bilaterally. Full range of motion.   LUNGS: Clear to auscultation. Few rhonchi were heard bilateral bases. No significant diminished breath sounds or wheezing. No labored respirations, increased effort, dullness to percussion, not in overt respiratory distress.   CARDIOVASCULAR: S1, S2 appreciated. No murmurs, gallops, rubs noted. PMI not lateralized. Chest is nontender to palpation. 1+ pedal pulses. No lower extremity edema, calf tenderness, or cyanosis was noted.   ABDOMEN: Soft, nontender. Bowel sounds present. No hepatosplenomegaly or masses were noted.   RECTAL: Deferred.   MUSCULOSKELETAL: Able to move all extremities. No cyanosis, degenerative joint disease, or kyphosis. She has AV fistula in left wrist.   SKIN: Skin did not reveal any rashes, lesions, erythema, nodularity, induration. It was warm and dry to palpation. Two areas of recent biopsies were done in back, lower thoracic area but dressing is placed and I was not able to look at that.    LYMPH: No adenopathy in cervical region.   NEUROLOGICAL: Cranial nerves grossly intact. Sensory is intact. No dysarthria or aphasia.   PSYCH: Patient is alert, oriented to time, person, place, cooperative. Memory is good. No significant confusion, agitation, depression noted.   LABORATORY, DIAGNOSTIC, AND RADIOLOGICAL DATA: EKG showed normal sinus rhythm at 81 beats per minute, normal axis, no acute ST-T changes.   BUN and creatinine 36 and 6.18, glucose 189, otherwise  BMP unremarkable. Patient's alkaline phosphatase slightly elevated at 147, otherwise unremarkable liver cardiac enzymes. Cardiac enzymes, first set negative. White blood cell count was mildly elevated to 12.7, hemoglobin 10.3, platelet count 183, absolute neutrophil count is not checked.   Chest x-ray is unremarkable.   ASSESSMENT AND PLAN:  1. Chest pain. Admit patient to medical floor. Get her cardiac enzymes x3. Continue metoprolol, aspirin, nitroglycerin as well as heparin sub-Q. Will get cardiology consultation as well as Myoview in the morning. Get lipid panel. 2. Hypertension. Will continue metoprolol as well as nitroglycerin topically and advance medications if needed.  3. Diabetes mellitus. Will continue home medications as well as sliding scale insulin. Get hemoglobin A1c.  4. End-stage renal disease. Nephrology consultation will be obtained.   TIME SPENT: 50 minutes.   ____________________________ Theodoro Grist, MD rv:cms D: 09/08/2012 14:02:51 ET T: 09/08/2012 14:37:56 ET JOB#: [id number]  cc: Theodoro Grist, MD, <Dictator> Rusty Aus, MD [place] MD ELECTRONICALLY SIGNED 09/08/2012 15:16

## 2015-04-01 NOTE — Discharge Summary (Signed)
PATIENT NAME:  [name], [name] MR#:  [id number] DATE OF BIRTH:  02-21-1934  DATE OF ADMISSION:  09/08/2012 DATE OF DISCHARGE:  09/10/2012  DISCHARGE DIAGNOSES:  1. Unstable angina.  2. End-stage renal disease.  3. Type 2 diabetes, controlled. 4. Gout without flare.   DISCHARGE MEDICATIONS: (Basically her prehospital medications and per records) 1. Allopurinol 100 mg daily.  2. Propranolol XL 120 mg daily.  3. Rena-Vite daily.  4. Humalog mix 25/75, 30 units twice a day. 5. Meclizine 25 units three times daily p.r.n.  6. Fexofenadine 60 mg daily p.r.n.  7. She will continue with her Monday, Wednesday, and Friday dialysis schedule as well.   HOSPITAL COURSE: The patient was admitted with chest pain, substernal, consistent with anginal type in an unstable pattern. She was continued on beta blocker. She was given subcutaneous heparin, aspirin, and nitroglycerin. Her chest pain improved. She ruled out for myocardial infarction by troponins and CKs. Her cholesterol level was 108 and total LDL was 65. She showed evidence of a renal failure with creatinine 6.89 on her labs with a mild anemia with hemoglobin of 10. Glucose was relatively well controlled, not over 200, usually in the low 100s on her fingersticks. Chest x-ray showed no acute disease of the chest. Myoview myocardial scan was done by Dr. Nehemiah Massed and was normal with normal LV function. She did not have           anymore chest pain in 24 hours and so she was discharged home. She will have dialysis Monday. She has labs and routine appointment with Dr. Sabra Heck on 09/16/2012. She will keep that follow-up.   TIME SPENT: It took approximately 38 minutes to do all discharge tasks today. ____________________________ Ocie Cornfield. Ouida Sills, MD mwa:slb D: 09/10/2012 09:36:52 ET T: 09/11/2012 09:50:48 ET JOB#: [id number]  cc: Ocie Cornfield. Ouida Sills, MD, <Dictator> Kirk Ruths MD ELECTRONICALLY SIGNED 09/12/2012 7:43

## 2015-04-01 NOTE — Consult Note (Signed)
PATIENT NAME:  [name], VATH MR#:  [id number] DATE OF BIRTH:  1933-12-22  DATE OF CONSULTATION:  09/08/2012  REFERRING PHYSICIAN:  Dr Minerva Fester PHYSICIAN:  Corey Skains, MD PRIMARY CARE PHYSICIAN: Emily Filbert, MD  REASON FOR CONSULTATION: Hypertension, chronic kidney disease, diabetes with unstable angina.   CHIEF COMPLAINT: "I had chest tightness and shortness of breath."   HISTORY OF PRESENT ILLNESS: This is a 81 year old female with known chronic kidney disease with dialysis who has had normal dialysis without any significant issues. The patient recently has gone to her dialysis and had some chest tightness with some shortness of breath lasting about 20 to 30 minutes and relieved by medications in the emergency room. The patient has had no evidence of EKG changes at this time with an EKG showing normal sinus rhythm, normal EKG, and troponin, CK-MB within normal limits. The patient has had no further chest discomfort at this time.   REVIEW OF SYSTEMS: The remainder of her review of systems negative for vision changes, ringing in the ears, hearing loss, cough, congestion, heartburn, nausea, vomiting, diarrhea, bloody stools, stomach pain, extremity pain, leg weakness, cramping of the buttocks, known blood clots, headaches, blackouts, dizzy spells, nosebleeds, congestion, trouble swallowing, frequent urination, urination at night, muscle weakness, numbness, anxiety, depression, skin lesions, or skin rashes.   PAST MEDICAL HISTORY:  1. Hypertension.  2. Hyperlipidemia.  3. Diabetes mellitus.  4. Chronic kidney disease.   FAMILY HISTORY: No family members with early onset of cardiovascular disease or hypertension.   SOCIAL HISTORY: She currently denies alcohol or tobacco use.   ALLERGIES: She has no known drug allergies.   CURRENT MEDICATIONS: As listed.   PHYSICAL EXAMINATION:  VITAL SIGNS: Blood pressure 162/66 bilaterally, heart rate 72 upright, reclining, and regular.    GENERAL: She is a well-appearing female in no acute distress.   HEENT: No icterus, thyromegaly, ulcers, hemorrhage, or xanthelasma.   HEART: Regular rate and rhythm. Normal S1 and S2. A2/6 apical murmur consistent with mitral regurgitation. PMI is normal size and placement. Carotid upstroke normal without bruit. Jugular venous pressure is normal.   LUNGS: Lungs have few basilar crackles with normal respirations.   ABDOMEN: Soft, nontender, without hepatosplenomegaly or masses. Abdominal aorta is normal size without bruit.   EXTREMITIES: Shows 2+ bilateral pulses in dorsal, pedal, radial, and femoral arteries without lower extremity edema, cyanosis, clubbing, or ulcers.   NEUROLOGIC: She is oriented to time, place, and person with normal mood and affect.   ASSESSMENT: A 81 year old female with chronic kidney disease, hypertension, hyperlipidemia, diabetes with acute onset of chest pressure consistent with unstable angina without current evidence of myocardial infarction.   RECOMMENDATIONS:  1. Serial ECG and enzymes to assess for possible myocardial infarction.  2. Echocardiogram for LV systolic dysfunction and murmur, undiagnosed. 3. Further consideration of stress test for further myocardial perfusion, further treatment options.  4. Continue diabetes with a goal hemoglobin A1c below 7.         5. Further treatment of hypertension with medical management with a goal systolic blood pressure below 130 mm. 6. Further treatment options after above.     ____________________________ Corey Skains, MD bjk:vtd D: 09/08/2012 17:47:52 ET T: 09/09/2012 09:08:25 ET JOB#: [id number]  cc: Corey Skains, MD, <Dictator> Corey Skains MD ELECTRONICALLY SIGNED 09/13/2012 8:46

## 2015-04-04 NOTE — H&P (Signed)
PATIENT NAME:  [name], [name] MR#:  [id number] DATE OF BIRTH:  Aug 28, 1934  DATE OF ADMISSION:  09/16/2013  REFERRING PHYSICIAN: Dr. Marin Roberts TEXT>> .   FAMILY PHYSICIAN: Dr. Emily Filbert.   REASON FOR ADMISSION: New onset atrial fibrillation with rapid ventricular response.   HISTORY OF PRESENT ILLNESS: The patient is a 81 year old female with a history of end-stage renal disease on hemodialysis, diabetes, gout, and sleep apnea, presents to the Emergency Room with tightness in her chest associated with shortness of breath. In the Emergency Room, the patient was noted to be in rapid atrial fibrillation. Denies any previous history of such. Denies previous MI. She states that she was once told she had "heart failure."  Is not followed by cardiology. In the Emergency Room, the patient was given IV Lopressor for rate control. CT was negative for pulmonary embolism. She is now admitted for further evaluation.   PAST MEDICAL HISTORY: 1. Obesity.  2. Benign hypertension.  3. Type 2 diabetes mellitus.  4. End-stage renal disease on hemodialysis.  5. Gout.  6. Sleep apnea on CPAP.  7. Status post bilateral knee surgeries.  8. Status post cholecystectomy.   MEDICATIONS: 1. Humalog 75/25 mix 45 units subcutaneous b.i.d.  2. Meclizine 25 mg p.o. t.i.d. p.r.n. dizziness.  3. Inderal 120 mg p.o. t.i.d. 4. Multivitamin 1 p.o. daily.  5. Renvela 2 tablets p.o. t.i.d.  6. Allopurinol 100)  mg p.o. daily.  7. Allegra 60 mg p.o. q.8 hours p.r.n. allergies.  ALLERGIES: PENICILLIN.   SOCIAL HISTORY: Negative for alcohol or tobacco abuse.   FAMILY HISTORY: Positive for diabetes, coronary artery disease, stroke and hypertension. Negative for breast or colon cancer.   REVIEW OF SYSTEMS:  CONSTITUTIONAL: No fever or change in weight.  EYES: No blurred or double vision. No glaucoma.  ENT: No tinnitus or hearing loss. No nasal discharge or bleeding. No difficulty swallowing.  RESPIRATORY: No cough or  wheezing. Denies hemoptysis. No painful respiration.  CARDIOVASCULAR: Some orthopnea. No palpitations. No syncope.  GASTROINTESTINAL: No nausea, vomiting or diarrhea. No abdominal pain. No change in bowel habits.  GENITOURINARY: No dysuria or hematuria. No incontinence.  ENDOCRINE: No polyuria or polydipsia. No heat or cold intolerance.  HEMATOLOGIC: The patient admits to anemia but denies easy bruising or bleeding.  LYMPHATIC: No swollen glands.  MUSCULOSKELETAL: The patient has pain in her back and knees but denies pain in her neck, shoulders or hips. Does have gout.  NEUROLOGIC: No numbness or migraines. Denies stroke or seizures. PSYCHIATRIC: The patient denies anxiety, insomnia, or depression.  PHYSICAL EXAMINATION:  GENERAL: The patient is obese in no acute distress.  VITAL SIGNS: Currently remarkable for blood pressure 139/70, heart rate of 114, and a respiratory rate of 18, temperature 99.0. HEENT: Normocephalic, atraumatic. Pupils equally round and reactive to light and accommodation. Extraocular movements are intact. Sclera nonicteric. Conjunctiva are clear. Oropharynx is clear.  NECK: Supple without JVD. No adenopathy or thyromegaly is noted.  LUNGS: Reveal basilar crackles without wheezes or rhonchi. No dullness. Respiratory effort is normal.  CARDIAC: Irregularly irregular rhythm. No significant rubs or gallops noted. PMI is nondisplaced. ABDOMEN: Soft, nontender with normoactive bowel sounds. No organomegaly or mass were appreciated. No hernias or bruits were noted.  EXTREMITIES: Revealed 2+ edema bilaterally without clubbing or cyanosis. Pulses are 2+ bilaterally.  SKIN: Warm and dry without rash or lesions.  NEUROLOGICAL: Cranial nerves II through XII grossly intact. Deep tendon reflexes were symmetric. Motor and sensory exam is nonfocal. PSYCHIATRIC: Revealed  a patient who is alert and oriented to person, place and time. She was cooperative and used good judgment.  LABORATORY  DATA:  EKG revealed atrial fibrillation at rate of 116 with nonspecific ST-T wave changes. CT of the chest was negative for pulmonary embolism and revealed no obvious pneumonia or heart failure. BNP was 8240. Total CK was 45 with an MB of 0.6 and a troponin of 0.02. Glucose 295 with a BUN of 42 and a creatinine of 7.76 with a sodium of 133 and a potassium of 4.0, with a GFR of 5. White count was 9.2, with a hemoglobin of 10.9.   ASSESSMENT: 1. New onset atrial fibrillation with rapid ventricular response.  2. Chest pain.  3. End-stage renal disease on hemodialysis.  4. Type 2 diabetes, on insulin.  5. Anemia of chronic disease.  6. Gout.   PLAN: The patient will be admitted to the floor with aspirin therapy, topical nitrates and will be on Lopressor and diltiazem for rate control. We will follow serial cardiac enzymes and check her thyroid status. We will also order an echocardiogram and a cardiology consult. We will follow her sugars with Accu-Cheks before meals and at bedtime and add sliding scale insulin as needed. We will check her magnesium level as well. We will continue CPAP at nighttime. Nephrology consult for continuing hemodialysis. Repeat routine labs and chest x-ray in the morning. Further treatment and evaluation will depend upon the patient's progress.   TOTAL TIME SPENT ON THIS PATIENT: 50 minutes.    ____________________________ Leonie Douglas Doy Hutching, MD jds:sg D: 09/16/2013 13:15:00 ET T: 09/16/2013 14:32:37 ET JOB#: [id number]  cc: Rusty Aus, MD Leonie Douglas. Doy Hutching, MD, <Dictator>   Kaylin Marcon Lennice Sites MD ELECTRONICALLY SIGNED 09/17/2013 8:03

## 2015-04-04 NOTE — Discharge Summary (Signed)
PATIENT NAME:  [name], [name] MR#:  [id number] DATE OF BIRTH:  [birth year]  DATE OF ADMISSION:  09/16/2013 DATE OF DISCHARGE:  09/18/2013   DISCHARGE DIAGNOSES:  1. Acute-onset atrial fibrillation, resolved.  2. Hypertension.  3. Diabetes mellitus, insulin requiring.  4. End-stage renal disease, on hemodialysis.  5. Gout.  6. Sleep apnea, on CPAP.   DISCHARGE MEDICATIONS:  1. Humalog 75/25 mix 45 units b.i.d. 2. Inderal 120 mg t.i.d. 3. Multivitamin daily.  4. Renvela 2 tabs t.i.d. 5. Allopurinol 100 mg daily.  6. Allegra 60 mg q.8 p.r.n. allergies.   REASON FOR ADMISSION: A 81 year old female presents with rapid atrial fibrillation. Please see H and P for HPI, past medical history and physical exam.   HOSPITAL COURSE: The patient was admitted, given IV Cardizem. She went into normal sinus rhythm. Her cardiac enzymes were minimally elevated. Dr. Nehemiah Massed thought that was due to end-stage renal disease and did not represent an acute MI. She remained asymptomatic upon conversion. She is typically on Inderal, and thus no further beta blocker is needed. She will hold her midodrine before dialysis as that could be a stimulant for her atrial fibrillation.   OVERALL PROGNOSIS: Guarded.   ____________________________ Rusty Aus, MD mfm:OSi D: 09/18/2013 07:10:47 ET T: 09/18/2013 07:23:54 ET JOB#: [id number]  cc: Rusty Aus, MD, <Dictator> Rusty Aus MD ELECTRONICALLY SIGNED 09/18/2013 19:32

## 2015-04-04 NOTE — Consult Note (Signed)
PATIENT NAME:  [name], [name] MR#:  [id number] DATE OF BIRTH:  29-Sep-1934  DATE OF CONSULTATION:  11/25/2013  REFERRING PHYSICIAN:   CONSULTING PHYSICIAN:  Laurene Footman, MD  REASON FOR CONSULTATION: Right foot and ankle pain.   HISTORY OF PRESENT ILLNESS: The patient is a 81 year old, who is a chronic hemodialysis patient and she has had frequent falls and is also on Coumadin for atrial fibrillation. She had  severe ankle pain where she thinks that she rolled her ankle, she is not really certain. She had x-rays in the Emergency Room, which were negative for fracture, but did show soft tissue swelling. She also complained to me of significant foot pain along the lateral border of the foot. On examination, she has significant edema of the lower extremity. She is tender over the deltoid ligament as well as the calcaneofibular ligament. There is no instability of the anterior drawer. She can flex and extend the ankle 10 degrees dorsiflexion and 20 degrees plantar flexion with just mild pain. She is tender at the base of the fifth metatarsal and along the peroneal tendon. X-ray of the foot was obtained and no fracture was identified.   CLINICAL IMPRESSION: Calcaneofibular and deltoid ligament sprain. With her history of renal disease and diabetes, there is potential that there is an occult fracture or early Charcot joint, which is not showing up. With her significant problems, I would rather not immobilize with a cast or fracture boot as this may cause skin breakdown. Recommend just trying an Ace wrap. Weight-bearing as tolerated. If that is not adequate, we may have to go to a fracture boot so she can do daily skin checks. I will continue to follow up with her as an outpatient.   ____________________________ Laurene Footman, MD mjm:aw D: 11/28/2013 09:26:44 ET T: 11/28/2013 09:35:51 ET JOB#: [id number]  cc: Laurene Footman, MD, <Dictator> Laurene Footman MD ELECTRONICALLY SIGNED 11/28/2013 12:00

## 2015-04-04 NOTE — Consult Note (Signed)
General Aspect 81 year old female with history of end-stage renal disease on dialysis that presented with new onset A. fib with RVR.  She was started on beta blocker and calcium channel blocker and did convert to sinus rhythm.  Last night she had an 8.2 pause for which patient was symptomatic and felt like she was going to pass out.  She had sinus bradycardia prior to the pause continued with sinus bradycardia afterward.  So far no further pauses.  All  rate control medications are on hold.  An echocardiogram is pending.  Patient also is a fall risk,  falling 3 weeks ago in her bathroom with a laceration to her eyebrow.  Several months ago she fell down some steps and within a year ago she fell in her yard.  Probably secondary to her fall  history, she should remain on aspirin but consideration of increasing from 81-325 mg.she does feel as though she's been more short of breath over the last few months but no exertional chest pain.  Her enzymes have been mildly elevated but thought not to be acute coronary syndrome but secondary to renal failure.  She continues on telemetry and will  continue to watch for further A. fib/bradycardia/ possible sick sinus syndrome. Nephrology is also seeing  patient  for ESRD/dialysis. Echocardiogram reveals normal EF with MR and TR.   Physical Exam:  GEN well developed, no acute distress   HEENT pink conjunctivae   NECK supple   RESP normal resp effort  clear BS   CARD Regular rate and rhythm  Bradycardic  Normal, S1, S2  No murmur   ABD denies tenderness  normal BS   EXTR negative edema   SKIN normal to palpation, skin turgor poor   NEURO cranial nerves intact, motor/sensory function intact   PSYCH alert, A+O to time, place, person, good insight   Review of Systems:  Subjective/Chief Complaint Irregular heartbeat, shortness of breath   General: No Complaints   Skin: Dryness   ENT: No Complaints   Eyes: No Complaints   Neck: No Complaints    Respiratory: Short of breath   Cardiovascular: Palpitations   Gastrointestinal: No Complaints   Genitourinary: No Complaints   Vascular: No Complaints   Musculoskeletal: frequent falls   Neurologic: No Complaints   Hematologic: No Complaints   Endocrine: No Complaints   Psychiatric: No Complaints   Review of Systems: All other systems were reviewed and found to be negative   Medications/Allergies Reviewed Medications/Allergies reviewed   Lab Results: Hepatic:  06-Oct-14 01:58   Bilirubin, Total 1.0  Alkaline Phosphatase 121  SGPT (ALT) 17  SGOT (AST) 19  Total Protein, Serum 6.6  Albumin, Serum  3.1  Routine Chem:  06-Oct-14 01:58   Result Comment TROPONIN - RESULTS VERIFIED BY REPEAT TESTING.  - PREVIOUS CALL:09/16/13 AT 1914..TPL  Result(s) reported on 17 Sep 2013 at 02:59AM.  Magnesium, Serum  2.9 (1.8-2.4 THERAPEUTIC RANGE: 4-7 mg/dL TOXIC: > 10 mg/dL  -----------------------)  Phosphorus, Serum 4.2 (Result(s) reported on 17 Sep 2013 at 02:41AM.)  Glucose, Serum  100  BUN  47  Creatinine (comp)  8.49  Sodium, Serum  133  Potassium, Serum 3.9  Chloride, Serum  96  CO2, Serum 26  Calcium (Total), Serum 8.9  Osmolality (calc) 279  eGFR (African American)  5  eGFR (Non-African American)  4 (eGFR values <26m/min/1.73 m2 may be an indication of chronic kidney disease (CKD). Calculated eGFR is useful in patients with stable renal function. The eGFR  calculation will not be reliable in acutely ill patients when serum creatinine is changing rapidly. It is not useful in  patients on dialysis. The eGFR calculation may not be applicable to patients at the low and high extremes of body sizes, pregnant women, and vegetarians.)  Anion Gap 11  Cardiac:  06-Oct-14 01:58   CK, Total 39  CPK-MB, Serum 1.2 (Result(s) reported on 17 Sep 2013 at 02:58AM.)  Troponin I  0.11 (0.00-0.05 0.05 ng/mL or less: NEGATIVE  Repeat testing in 3-6 hrs  if clinically  indicated. >0.05 ng/mL: POTENTIAL  MYOCARDIAL INJURY. Repeat  testing in 3-6 hrs if  clinically indicated. NOTE: An increase or decrease  of 30% or more on serial  testing suggests a  clinically important change)  Routine Hem:  06-Oct-14 01:58   WBC (CBC)  13.1  RBC (CBC)  2.79  Hemoglobin (CBC)  10.8  Hematocrit (CBC)  30.9  Platelet Count (CBC) 186  MCV  111  MCH  38.6  MCHC 34.9  RDW 14.5  Neutrophil % 69.6  Lymphocyte % 18.9  Monocyte % 7.9  Eosinophil % 3.0  Basophil % 0.6  Neutrophil #  9.1  Lymphocyte # 2.5  Monocyte #  1.0  Eosinophil # 0.4  Basophil # 0.1 (Result(s) reported on 17 Sep 2013 at 02:21AM.)   Radiology Results: XRay:    05-Oct-14 09:47, Chest Portable Single View  Chest Portable Single View   REASON FOR EXAM:    Chest Pain  COMMENTS:       PROCEDURE: DXR - DXR PORTABLE CHEST SINGLE VIEW  - Sep 16 2013  9:47AM     RESULT: Comparison is made to the study of September 08, 2012.    The lungs are well-expanded. There is no focal infiltrate. The cardiac   silhouette is mildly enlarged though stable. The central pulmonary   vascularity is stable. The interstitial markings are mildly prominent but   this is not entirely new either. There is no pleural effusion or   pneumothorax. The observed portions of the bony thorax appear normal.    IMPRESSION:  There may be an element of low-grade CHF. There is no focal   infiltrate. A followup PA and lateral chest x-ray would be of value.   Dictation Site: 5        Verified By: DAVID A. Martinique, M.D., MD    06-Oct-14 08:09, Chest PA and Lateral  Chest PA and Lateral   REASON FOR EXAM:    SOB  COMMENTS:       PROCEDURE: DXR - DXR CHEST PA (OR AP) AND LATERAL  - Sep 17 2013  8:09AM     RESULT: Comparison is made to the study of 09/16/2013. There is mild   prominence of the lung markings. Cardiac silhouette is normal.Cardiac   monitoring electrodes are present. There is no edema, effusion, mass or    pneumothorax.    IMPRESSION:   1. Mild interstitial prominence diffusely. No acute abnormality.    Dictation Site: 2    Verified By: Sundra Aland, M.D., MD  Cardiology:    06-Oct-14 08:11, Echo Doppler  Echo Doppler   REASON FOR EXAM:      COMMENTS:       PROCEDURE: [facility] [place] - ECHO DOPPLER COMPLETE(TRANSTHOR)  - Sep 17 2013  8:11AM     RESULT: Echocardiogram Report    Patient Name:   [name] Date of Exam: 09/17/2013  Medical Rec #:  [id number]  Custom1:  Date of Birth:  Sep 08, 1934       Height:       61.8 in  Patient Age:    67 years        Weight:       233.7 lb  Patient Gender: F               BSA:          2.04 m??    Indications: Atrial Fib  Sonographer:    Sherrie Sport RDCS  Referring Phys: Emily Filbert, F    Summary:   1. Left ventricular ejection fraction, by visual estimation, is 55 to   60%.   2. Normal global left ventricular systolic function.   3. Moderately dilated left atrium.   4. Moderately dilated right atrium.   5. Moderate to severe mitral valve regurgitation.   6. Severe thickening of the anterior and posterior mitral valve leaflets.   7. Moderate to severe tricuspid regurgitation.   8. Severely increased left ventricular posterior wall thickness.  2D AND M-MODE MEASUREMENTS (normal ranges within parentheses):  Left Ventricle:          Normal  IVSd (2D):      1.20 cm (0.7-1.1)  LVPWd (2D):     1.52 cm (0.7-1.1) Aorta/LA:                  Normal  LVIDd (2D):     4.52 cm (3.4-5.7) Aortic Root (2D): 3.10 cm (2.4-3.7)  LVIDs (2D):    3.28 cm           Left Atrium (2D): 4.10 cm (1.9-4.0)  LV FS (2D):     27.4 %   (>25%)  LV EF (2D):     53.4 %   (>50%)                                    Right Ventricle:                                    RVd (2D):        9.52 cm  LV DIASTOLIC FUNCTION:  MV Peak E: 1.38 m/s E/e' Ratio: 28.30  MV Peak A: 0.87 m/s Decel Time: 275 msec  E/A Ratio: 1.59  SPECTRAL DOPPLER ANALYSIS (where applicable):  Mitral  Valve:  MV P1/2 Time: 79.75 msec  MV Area, PHT: 2.76 cm??  Aortic Valve: AoV Max Vel: 1.15 m/s AoV Peak PG: 5.3 mmHg AoV Mean PG:  LVOT Vmax: 0.77 m/s LVOT VTI:  LVOT Diameter: 2.00 cm  AoV Area, Vmax: 2.10 cm?? AoV Area, VTI:  AoV Area, Vmn:  Tricuspid Valve and PA/RV Systolic Pressure: TR Max Velocity: 2.86 m/s RA   Pressure: 5 mmHg RVSP/PASP: 37.8 mmHg  Pulmonic Valve:  PV Max Velocity: 0.83 m/s PV Max PG: 2.7 mmHg PV Mean PG:    PHYSICIAN INTERPRETATION:  Left Ventricle: The left ventricular internal cavity size was normal. LV   posterior wall thickness was severely increased. Global LV systolic   function was normal. Left ventricular ejection fraction, by visual   estimation, is 55 to 60%.  Right Ventricle: Normal right ventricular size, wall thickness, and   systolic function. RV wall thickness is normal.  Left Atrium: The left atrium is moderately dilated.  Right Atrium: The right atrium is moderately  dilated.  Pericardium: There is no evidence of pericardial effusion.  Mitral Valve: There is severe thickening of the anterior and posterior   mitral valve leaflets. Moderate to severe mitral valve regurgitation is   seen.  Tricuspid Valve: Moderate to severe tricuspid regurgitation is   visualized. The tricuspid regurgitant velocity is 2.86 m/s, and with an   assumed right atrial pressure of 5 mmHg, the estimated rightventricular   systolic pressure is normal at 37.8 mmHg.  Aortic Valve: The aortic valve is trileaflet and structurally normal,   with normal leaflet excursion; without any evidence of aortic stenosis or   insufficiency.  Pulmonic Valve: Structurally normal pulmonic valve, with normal leaflet   excursion.  Aorta: The aortic root and ascending aorta are structurally normal, with   no evidence of dilitation.    [id number] Serafina Royals MD  Electronically signed by [id number] Serafina Royals MD  Signature Date/Time: 09/17/2013/12:22:54 PM    *** Final ***    IMPRESSION:  .        Verified By: Corey Skains  ([facility]), M.D., MD  CT:    05-Oct-14 11:13, CT Chest With Contrast  CT Chest With Contrast   REASON FOR EXAM:    EVAL FOR PULMONARY EMBOLISM shortness of breath, new   atrial fib. Dialysis patien  COMMENTS:       PROCEDURE: CT  - CT CHEST WITH CONTRAST  - Sep 16 2013 11:13AM     RESULT: Axial CT scanning was performed through the chest with   reconstructions at 3 mm intervals and slice thicknesses. The patient   received 100 cc of Isovue-370 intravenously. Review of multiplanar   reconstructed images was performed separately on the VIA monitor.   Comparison is made to chest x-ray of September 16, 2013.     Contrast within the pulmonary arterial tree is tree is normal. There are   no findings suspicious for an acute pulmonary embolism. The caliber of   the thoracic aorta is normal. There is no evidence of a false lumen. The     cardiac chambers are mildly enlarged. There is a small hiatal hernia.   There is no mediastinal nor hilar lymphadenopathy. There is no pleural   nor pericardial effusion.     At lung window settings there is no evidence of a pneumothorax or   alveolar infiltrate. There is minimal compressive atelectasis at both   lung bases. The thoracic vertebral bodies are preserved in height. There   is calcification of the anterior longitudinal ligament of the thoracic   spine at multiple levels.     Within the upper abdomen the observed portions of the liver and spleen   and adrenal glands exhibit no acute abnormalities. There are   nonobstructing stones in both kidneys, and there are exophytic cortical   hyperdensities in both kidneys which do not appear to reflect simple   cysts.  IMPRESSION:   1. There is no evidence of an acute pulmonary embolism. There is no acute   thoracic aortic pathology.  2. There is mild enlargement of the cardiac chambers without overt   evidence of CHF.  3. There is no evidence ofpneumonia.  There is minimal compressive   atelectasis inferiorly in the lower lobes.   4. There is a small hiatal hernia.   5. There are nonobstructing stones in both kidneys. Both kidneys exhibit   findings that may reflect hyperdense cysts in the midpole regions but   these merit further evaluation  when the patient has recovered from her   acute illness.     Dictation Site: 5    Verified By: DAVID A. Martinique, M.D., MD    Penicillin: Rash  Vital Signs/Nurse's Notes: **Vital Signs.:   06-Oct-14 12:04  Temperature Temperature (F) 97.6  Celsius 36.4  Temperature Source oral  Pulse Pulse 66  Respirations Respirations 18  Systolic BP Systolic BP 322  Diastolic BP (mmHg) Diastolic BP (mmHg) 71  Mean BP 91  Pulse Ox % Pulse Ox % 100  Pulse Ox Activity Level  At rest  Oxygen Delivery 2L    Impression 81 year old female with history of diabetes mellitus and end-stage renal disease with dialysis presenting with new onset A. fib with RVR, converting with  rate control medications, now in sinus rhythm with one 8.2 second pause, rate control on hold.   Plan 1.  Continue to watch patient on telemetry for any further atrial fibrillation, significant bradycardia or pauses. 2.  Patient is a fall watch and after discussion with Dr. Nehemiah Massed will continue on aspirin only but consideration to increase to 325 mg a day. 3.  Continue to hold rate control medication. If beta blocker restarted, after discussion with nephrology, metoprolol is fully dialyzed out and carvedilol at bedtime 1x a day is preferred. 4.  No further cardiac intervention is needed for mildly elevated cardiac enzymes thought to be secondary to renal failure and not acute coronary syndrome. 5.  Further recommendations per Dr. Nehemiah Massed.   Electronic Signatures: Roderic Palau (NP)  (Signed 06-Oct-14 13:59)  Authored: General Aspect/Present Illness, History and Physical Exam, Review of System, Labs, Radiology, Allergies, Vital Signs/Nurse's  Notes, Impression/Plan   Last Updated: 06-Oct-14 13:59 by Roderic Palau (NP)

## 2015-04-04 NOTE — Discharge Summary (Signed)
PATIENT NAME:  [name], [name] MR#:  [id number] DATE OF BIRTH:  June 28, 1934  DATE OF ADMISSION:  11/27/2013 DATE OF DISCHARGE: 11/30/2013   DISCHARGE DIAGNOSES:  1.  Acute right ankle sprain. 2.  Severe knee arthritis with ataxia and weakness. 3.  Chronic atrial fibrillation, on Coumadin.  4.  Diabetes mellitus, insulin-requiring.  5.  End-stage renal disease, on hemodialysis.  6.  Gout.  7.  Sleep apnea, on CPAP.  8.  Hypertension.   DISCHARGE MEDICATIONS: Meclizine 25 mg t.i.d. p.r.n. dizziness, multivitamin daily, propranolol LA 120 mg t.i.d., NovoLog 70/30 insulin 45 units subcu b.i.d., tramadol 50 mg 1 to 2 q.6 hours p.r.n. pain, allopurinol 100 mg 2 tabs daily, Capoten 100 mg b.i.d., Norco 5/325 mg 1 q.6 hours p.r.n. pain, amiodarone 200 mg b.i.d. warfarin 2.5 mg at bedtime, Bactroban ointment daily to left nose, docusate senna 1 tab t.i.d., calcium acetate 2 tabs t.i.d. and nasal saline q.i.d.   REASON FOR ADMISSION: A 81 year old female presents with right leg, ankle pain, weakness, ataxia and multiple falls. Please see H and P for HPI, past medical history and physical exam.   HOSPITAL COURSE: The patient was admitted. Orthopedics saw [name] and saw that there was no fracture and wrapped her ankle. The pain did improve with pain medication and Solu-Medrol x 1. She required a moderate amount of IV morphine. She was switched over to her typical hydrocodone. She was constipated and her bowels moved finally with milk of magnesia and Dulcolax suppositories. She is post surgery on her left nose for skin cancer. Has been placed on Bactroban daily. Overall, the wound site looks good. I did not see any stitches. Sugar is reasonably controlled. She will be going to [facility] for PT and OT and hopefully back home from there. ____________________________ Rusty Aus, MD mfm:aw D: 11/30/2013 07:59:51 ET T: 11/30/2013 08:20:13 ET JOB#: [id number]  cc: Rusty Aus, MD, <Dictator> Rivka Baune Roselee Culver MD ELECTRONICALLY SIGNED 12/03/2013 8:13

## 2015-04-04 NOTE — Consult Note (Signed)
Brief Consult Note: Diagnosis: probable medial and lateral ankle sprain, possible fracture.   Patient was seen by consultant.   Recommend further assessment or treatment.   Orders entered.   Comments: foot xrays ordered with her DM, any brace or cast has potential risk will recheck in am.  Electronic Signatures: Laurene Footman (MD)  (Signed 14-Dec-14 15:40)  Authored: Brief Consult Note   Last Updated: 14-Dec-14 15:40 by Laurene Footman (MD)

## 2015-04-04 NOTE — H&P (Signed)
PATIENT NAME:  [name], [name] MR#:  [id number] DATE OF BIRTH:  19-Oct-1934  DATE OF ADMISSION:  12/07/2013  ADDENDUM: This is a continuation.   CURRENT MEDICATIONS: Warfarin 5 mg once a day, timolol 50 mg 1 to 2 every 6 hours, nasal sodium chloride, propanol 120 mg 3 times a day, NovoLog 70/30 mix 45 units subcutaneously twice daily. Mupirocin 2% topical, midodrine 2.5 mg once a day, meclizine 25 mg as needed for vertigo. She is not taking Humalog. Docusate as needed for constipation, renal multivitamin once a day, calcium acetate 2 capsules 3 times a day, amiodarone 200 mg once a day, allopurinol 100 mg take 2 tablets once a day and Tylenol every 4 hours.   SOCIAL HISTORY: The patient lives with her husband. The patient has no alcohol, no tobacco use. Her primary care physician is Dr. Sabra Heck.   ALLERGIES: PENICILLIN.   FAMILY HISTORY: Positive for diabetes, coronary artery disease, strokes and hypertension. Negative for cancer.   PHYSICAL EXAMINATION: VITAL SIGNS: Blood pressure of 75/40 on admission, pulse 83, respirations 18, temperature 98.2, oxygen saturation 99% on 2 liters of oxygen; oxygen saturation not documented at room air.  GENERAL: The patient is alert, oriented x 3, mild respiratory distress due to shortness of breath.  HEENT: Pupils are equal and reactive. Extraocular movements are intact. Mucosa are moist. Anicteric sclerae. Pink conjunctivae. No oral lesions. No oropharyngeal exudates.  NECK: Supple. No JVD. No thyromegaly. No adenopathy.   CARDIOVASCULAR: Regular rhythm. No murmurs, rubs or gallops.  LUNGS: Positive crackles diffuse, in both respiratory fields. Positive use of accessory muscles, especially when she talks or moves.  ABDOMEN: Soft, nontender, nondistended. No hepatosplenomegaly. No masses. Bowel sounds are positive.  EXTREMITIES: There is +3 edema. No clubbing or cyanosis. Pulses +2. Capillary refill less than 3 seconds.  SKIN: No rashes or petechiae. There is  decreased turgor.  NEUROLOGICAL: Cranial nerves II through XII intact. Strength is 5/5 in all 4 extremities.  PSYCHIATRIC: No significant agitation or anxiety.  LYMPHATIC: Negative for lymphadenopathy in the neck or supraclavicular areas.   DIAGNOSTIC DATA: BNP 34,000. Glucose 155, creatinine 4.24, sodium 135, albumin 2.9, bilirubin 1.1, cardiac enzymes negative. White count 9.9, hemoglobin 11.1, platelet count 123. INR 1.9. Chest x-ray: Cardiomegaly with pulmonary vascular congestion. EKG: There is T-wave inversion in V2, no ST elevation or depression in any other leads, atrial fibrillation with rate control.   ASSESSMENT AND PLAN: A 82 year old female with a history of end-stage renal disease unable to be dialyzed, diabetes, hypertension, gout, sleep apnea comes with increased shortness of breath and hypotension.  1.  Hypotension. The patient comes with blood pressure 70s/40s. Heart rate is up to 90. The patient is afebrile but has multiple signs or symptoms that could be related with infection. The patient has been  poly cultured, but at this moment I am not going to start any antibiotics. Her fistula looks good. Her lungs sound wet but no consolidation. Her urinalysis is pending. Influenza test is negative.  2.  Possibilities of this being septic shock are there but at this moment, I do not see any clinical indication to start her on antibiotics  other than the low blood pressure.  3.  Congestive heart failure with diastolic dysfunction. The patient has significant fluid overload whenever she presented. On the previous dialysis, she was 3 kg over. Today, it is more than 9 kg over. The patient needs dialysis, not emergently. As she is not in pulmonary edema, she can wait  for tomorrow. Her potassium is fine. Her electrolytes are fine. Low-sodium diet recommended. Unable to do Lasix as the patient does not make urine. The patient actually has diastolic dysfunction, but since the patient has hypotension, I  am not going to be able to give her a beta blocker. Hold blood pressure medications.  4.  Diabetes. The patient has well-controlled insulin-dependent diabetes. Continue 70/30 mix.  5.  Atrial fibrillation. No signs of rapid ventricular response. Continue amiodarone at the usual dose.  6.  Gastrointestinal prophylaxis with Protonix.  7.  Deep vein thrombosis prophylaxis. The patient is on warfarin. Continue to check PT/ INR.  8.  Gout. Continue allopurinol.    We will transfer care to Dr. Sabra Heck. The patient is to be dialyzed tomorrow. Follow on cultures, check echocardiogram to evaluate congestive heart failure.   TIME SPENT: I spent about 50 minutes with this patient.    ____________________________ Lankin Sink, MD rsg:cs D: 12/07/2013 18:12:26 ET T: 12/07/2013 19:41:49 ET JOB#: [id number]  cc: [place] Sink, MD, <Dictator> Jojuan Champney America Brown MD ELECTRONICALLY SIGNED 12/10/2013 0:50

## 2015-04-04 NOTE — H&P (Signed)
PATIENT NAME:  [name], SEEL MR#:  [id number] DATE OF BIRTH:  05/06/1934  DATE OF ADMISSION:  11/25/2013  REFERRING PHYSICIAN: Dr. Thomasene Lot.  FAMILY PHYSICIAN: Dr. Emily Filbert.   REASON FOR ADMISSION: Ambulatory dysfunction with frequent falls.   HISTORY OF PRESENT ILLNESS: The patient is a 81 year old female with a history of morbid obesity, benign hypertension, and type 2 diabetes, atrial fibrillation on Coumadin and end-stage renal disease on hemodialysis. Has been falling more frequently at home. She fell last night, injuring her right ankle. She is unable to ambulate. Her husband is unable to take care of her at home and she cannot take care of herself. She was brought to the Emergency Room where she had lower extremity swelling with severe right ankle pain. Plain films were unremarkable. It is unsafe to discharge back to home and she is now admitted for further evaluation.   PAST MEDICAL HISTORY: 1. Atrial fibrillation on anticoagulation.  2. Morbid obesity.  3. Benign hypertension.  4. Type 2 diabetes.  5. End-stage renal disease on hemodialysis.  6. Gout.  7. Sleep apnea, on CPAP.  8. Status post bilateral knee surgeries.  9. Status post cholecystectomy.   MEDICATIONS: 1. Humalog 75/25 mix 45 units subcutaneous b.i.d.  2. Inderal 120 mg p.o. t.i.d.  3. Multivitamin 1 p.o. daily.  4. Renvela 2 tablets p.o. t.i.d.  5. Allopurinol 100 mg p.o. daily.  6. Coumadin 2.5 mg p.o. daily.  7. Norco 5/325 1 p.o. q.4 hours p.r.n. pain.  8. Amiodarone 200 mg p.o. b.i.d.  9. Gabapentin 100 mg p.o. b.i.d.  10. Meclizine 25 mg p.o. t.i.d. as needed for dizziness.   ALLERGIES: PENICILLIN.   SOCIAL HISTORY: Negative for alcohol or tobacco abuse.   FAMILY HISTORY: Positive for diabetes, coronary artery disease, stroke and hypertension. Negative for breast or colon cancer.   REVIEW OF SYSTEMS:  CONSTITUTIONAL: No fever or change in weight.  EYES: No blurred or double vision. No glaucoma.   ENT: No tinnitus or hearing loss. No nasal discharge or bleeding. No difficulty swallowing.  RESPIRATORY: No cough or wheezing. Denies hemoptysis.  CARDIOVASCULAR: No chest pain or orthopnea. No palpitations. No syncope.  GASTROINTESTINAL: No nausea, vomiting, or diarrhea. No abdominal pain. No change in bowel habits.  GENITOURINARY: No dysuria or hematuria. No incontinence.  ENDOCRINE: No polyuria or polydipsia. No heat or cold intolerance.  HEMATOLOGIC: The patient denies anemia, easy bruising or bleeding.  LYMPHATIC: No swollen glands.  MUSCULOSKELETAL: The patient complains of pain in her back, knees, hips and ankles. No neck or shoulder pain. Does have a history of gout.  NEUROLOGIC: No numbness or migraines. Denies stroke or seizures.  PSYCHIATRIC: The patient denies anxiety, insomnia or depression.   PHYSICAL EXAMINATION: GENERAL: The patient is obese, in no acute distress.  VITAL SIGNS: Currently remarkable for blood pressure of 112/50 with a heart rate of 83, respiratory of 20, temperature of 98.3.  HEENT: Normocephalic, atraumatic. Pupils equally round and reactive to light and accommodation. Extraocular movements are intact. Sclerae are anicteric. Conjunctivae are clear.  OROPHARYNX: Clear.  NECK: Supple without JVD. No adenopathy or thyromegaly is noted.  LUNGS: Reveal basilar crackles without wheezes or rhonchi. No dullness. Respiratory effort is normal.  CARDIAC: Reveals irregularly irregular rhythm. No significant rubs or gallops.   ABDOMEN: Soft, nontender, with normoactive bowel sounds. No organomegaly or masses were appreciated. No hernias or bruits were noted.  EXTREMITIES: Revealed 2+ edema without clubbing or cyanosis. Pulses were 2+ bilaterally.  SKIN: Warm  and dry without rash or lesions.  NEUROLOGIC: Cranial nerves II through XII grossly intact. Deep tendon reflexes were symmetric. Motor and sensory exam is nonfocal.  PSYCHIATRIC: Revealed a patient who was alert  and oriented to person, place, and time. She was cooperative and used good judgment.   LABORATORY DATA: EKG revealed atrial fibrillation at 89 beats per minute with no acute ischemic changes. Plain films of the right ankle revealed no acute fracture. Soft tissue swelling was noted. Her pro time was 23.4, with an INR of 2.1. White count 12.3 with a hemoglobin of 9.8, platelet count 159,000. Her glucose was 214 with a BUN of 33, creatinine of 7.68 with a GFR 5. Sodium 135, potassium 3.7. Troponin was 0.05.   ASSESSMENT: 1. Ambulatory dysfunction with ankle trauma and pain.  2. Morbid obesity.  3. End-stage renal disease on hemodialysis.  4. Unsafe to discharge.  5. Chronic atrial fibrillation on Coumadin.  6. Benign hypertension.  7. Diabetes mellitus insulin-requiring.  8. Sleep apnea, on CPAP.   PLAN: The patient will be observed on the orthopedics unit. We will consult orthopedics, physical therapy, and nephrology. We will obtain a social work consult for possible placement into an assisted living facility. We will continue dialysis per her Monday, Wednesday, Friday regimen. Follow up routine labs in the morning. We will follow her sugars with Accu-Cheks before meals and at bedtime and add sliding scale insulin as needed. We will continue CPAP. Further treatment and evaluation will depend upon the patient's progress.   TOTAL TIME SPENT ON THIS PATIENT: 50 minutes.    ____________________________ Leonie Douglas Doy Hutching, MD jds:sg D: 11/25/2013 11:08:07 ET T: 11/25/2013 13:47:05 ET JOB#: [id number]  cc: Leonie Douglas. Doy Hutching, MD, <Dictator> Rusty Aus, MD  Izzah Pasqua D Maddax Palinkas MD ELECTRONICALLY SIGNED 11/26/2013 17:07

## 2015-04-05 NOTE — Discharge Summary (Signed)
PATIENT NAME:  [name], [name] MR#:  [id number] DATE OF BIRTH:  1934-06-12  DATE OF ADMISSION:  12/07/2013 DATE OF DISCHARGE:  12/10/2013  HISTORY OF PRESENT ILLNESS: Mrs. Scarberry is a 81 year old white lady followed by Dr. Emily Filbert, who had been in rehab at [facility]. The patient had known end-stage renal disease and is undergoing hemodialysis. Unfortunately, she missed at least 2 sessions due to hypotension. On the day of admission, she had presented for dialysis and was noted again to be hypotensive and to be volume overloaded. Dialysis was stopped halfway through and she was sent to the Emergency Room. The patient was evaluated by the hospitalist for admission. She was admitted for inpatient dialysis and treatment of her hypotension.   PAST MEDICAL HISTORY: Notable for morbid obesity, hypertension, chronic atrial fibrillation, on Coumadin, type 2 diabetes, end-stage renal disease on dialysis, sleep apnea on CPAP, history of gout and a history of osteoarthritis.   PAST SURGICAL HISTORY: Included bilateral knee replacements and a cholecystectomy.   ADMISSION MEDICATIONS:  Warfarin 5 mg daily, propanolol 120 mg 3 times a day, NovoLog 70/30 mix 35 units b.i.d., Midodrine 2.5 mg daily, meclizine 25 mg as needed, Humalog 35 mg b.i.d., renal multivitamin, amiodarone 200 mg daily and allopurinol 100 mg 2 tablets daily.   ALLERGIES: PENICILLIN.   ADMISSION PHYSICAL EXAMINATION: Revealed a blood pressure 75/40 on admission, pulse was 83, respirations of 18, temperature 98.2 and pulse oximetry was 99% on 2 L. Admission physical examination as described by the admitting physician was most notable for respiratory distress with use of ancillary muscles. She had crackles in both lung bases. She was noted to have 3+ edema.   Admission chest x-ray showed cardiomegaly with pulmonary vascular congestion. EKG showed T wave inversion in V2. She had atrial fibrillation with a controlled rate. INR on admission was  1.9. Hemoglobin was 11.1, white count 9900 and platelet count 123,000. BNP was 34,000. Blood sugar was 155. Creatinine 4.24. Cardiac enzymes were negative.   HOSPITAL COURSE: The patient was admitted to the regular medical floor where she was placed on telemetry. She was cultured up due to her hypotension to make sure it was not sepsis, but her cultures eventually were negative. The patient did undergo hemodialysis at least twice while she was in the hospital with a good response. They took off over 9 kg on the first dialysis treatment. She had dialysis earlier this morning, the results of which are not known at the present time. The patient did have an echocardiogram during her hospitalization but, unfortunately, the report is also still pending. The patient was well controlled on her diabetes. Her atrial fib. remained well controlled while in the hospital.   DISCHARGE DIAGNOSES:  1.  Acute systolic and diastolic congestive heart failure.  2.  End-stage renal disease.  3.  Diabetes.  4.  Chronic atrial fibrillation.   DISCHARGE MEDICATIONS:  1.  Coumadin 5 mg daily.  2.  Norco 5/325 mg 1 to 2 tablets every 4 hours as needed for pain.  3.  Colace 100 mg b.i.d. p.r.n.  4.  Propanolol 40 mg daily.  5.  Senokot 1 tablet b.i.d. p.r.n.  6.  Amiodarone 200 mg daily.  7.  Ultram 50 mg every 6 hours p.r.n. pain.  8.  Allopurinol 200 mg daily.  9.  Meclizine 25 mg t.i.d. p.r.n. vertigo.  10. ProAmatine 2.5 mg daily.  11. Calcium acetate 1 capsule t.i.d.  12. Multivitamin with minerals 1 tablet daily.  13. NovoLog 70/30 35 units twice a day.  14. Humalog 35 units subcutaneous twice a day.   DISCHARGE DISPOSITION: The patient is being discharged on a renal failure diet with activity as tolerated. She will return to [facility] for continued rehab. She will continue to get dialysis on Monday, Wednesday and Friday.   CODE STATUS: The patient is a full code.  ____________________________ Hewitt Blade.  Sarina Ser, MD jbw:aw D: 12/10/2013 12:18:08 ET T: 12/10/2013 12:54:12 ET JOB#: [id number]  cc: Hewitt Blade. Sarina Ser, MD, <Dictator> Lottie Mussel III MD ELECTRONICALLY SIGNED 12/15/2013 8:44

## 2015-04-05 NOTE — Discharge Summary (Signed)
ADDENDUM  PATIENT NAME:  [name], [name] MR#:  [id number] DATE OF BIRTH:  01/24/1934  DATE OF ADMISSION:  12/07/2013 DATE OF DISCHARGE:  12/12/2013  [name]'s discharge on 12/10/2013 was canceled when she returned from dialysis and was noted to be in some respiratory distress due to asthmatic bronchitis. She was treated with SVNs and started on Symbicort. She had rather quick resolution. According to the patient, she had used inhalers in the past for COPD, but had not been on inhalers for some time. Unfortunately, the patient's heart rate went up with the SVNs. She was placed on Cardizem 60 mg every 6 hours to achieve better heart rate control with her atrial fib. On the morning of discharge, her heart rate was in the 80s. The patient is to be discharged today following her dialysis this morning. Changes in her medications include Symbicort 160/4.5 mcg HFA inhaler 2 puffs b.i.d., Cardizem 60 mg q.6 hours, NovoLog Mix 70/30, 30 units b.i.d. with meals. The latter was reduced because of evening hypoglycemia. The remainder of the patient's medications remain unchanged. The patient will be transferred to rehab today following dialysis.   ____________________________ Hewitt Blade Sarina Ser, MD jbw:aw D: 12/12/2013 07:12:35 ET T: 12/12/2013 07:30:02 ET JOB#: [id number]  cc: Hewitt Blade. Sarina Ser, MD, <Dictator> Lottie Mussel III MD ELECTRONICALLY SIGNED 12/15/2013 8:44

## 2015-04-05 NOTE — Op Note (Signed)
PATIENT NAME:  [name], [name] MR#:  [id number] DATE OF BIRTH:  Apr 23, 1934  DATE OF PROCEDURE:  06/13/2014  PREOPERATIVE DIAGNOSES:  1.  End-stage renal disease.  2.  Poorly functioning left arm arteriovenous fistula.  3.  Hypertension.   POSTOPERATIVE DIAGNOSES: 1.  End-stage renal disease.  2.  Poorly functioning left arm arteriovenous fistula.  3.  Hypertension.   PROCEDURES:  1.  Ultrasound guidance for vascular access to left radiocephalic arteriovenous fistula.  2.  Percutaneous transluminal angioplasty with 8 and 10 mm diameter high-pressure angioplasty balloons to the left mid forearm venous stenosis.   SURGEON: Algernon Huxley, M.D.   ANESTHESIA: Local with moderate conscious sedation.   ESTIMATED BLOOD LOSS: Minimal.   FLUOROSCOPY TIME: Approximately 2 minutes and 35 mL of contrast were used.   INDICATION FOR PROCEDURE: A 81 year old white female with end-stage renal disease. Her dialysis access has had diminishing flows and increasing aneurysmal segment at the venous access site.  A noninvasive study showed worsening stenosis of the vein beyond this area, closer to the antecubital fossa.  She drained mostly through what appeared to be crossing and crossed into the basilic vein in the forearm, and the area of stenosis was likely a junction of the cephalic and basilic veins.  The patient is brought in for a fistulogram for further evaluation and potential treatment. Risks and benefits were discussed. Informed consent was obtained.   DESCRIPTION OF PROCEDURE: The patient was brought to the vascular suite. Left upper extremity was sterilely prepped and draped and a sterile surgical field was created. The fistula was accessed under direct ultrasound guidance without difficulty with a micropuncture needle at the aneurysmal segments of venous access site. Imaging was performed which showed about a 75% to 80% narrowing, what appeared to be a junction in the cephalic and basilic veins with  dual outflow beyond this. The upper arm cephalic and basilic veins both had good outflow and the central venous circulation was widely patent. I was able to cross this stenosis in the dominant draining vein which was likely the basilic vein with a Magic torque wire and Kumpe catheter. I then treated this lesion initially with an 8 mm diameter high-pressure angioplasty balloon inflated to 20 atmospheres to release the narrowing.  With the balloon inflated, imaging was performed, which showed the arterial anastomosis in stent and the peri-anastomotic region to be patent. I then upsized to a 10 mm diameter angioplasty balloon, inflated in the same area that went up to 16 atmospheres to break the narrowing. Completion angiogram following this showed markedly improved flow with only about a 20% residual stenosis and I elected to terminate the procedure. The sheath was removed, 4-0 Monocryl pursestring suture was placed. Pressure was held. Sterile dressing was placed. The patient tolerated the procedure well and was taken to the recovery room in stable condition.    ____________________________ Algernon Huxley, MD jsd:ts D: 06/13/2014 11:47:45 ET T: 06/13/2014 16:23:12 ET JOB#: [id number]  cc: Algernon Huxley, MD, <Dictator> Algernon Huxley MD ELECTRONICALLY SIGNED 06/17/2014 15:46

## 2015-04-05 NOTE — H&P (Signed)
PATIENT NAME:  [name], RHINES MR#:  [id number] DATE OF BIRTH:  18-Mar-1934  DATE OF ADMISSION:  12/07/2013  PRIMARY CARE PHYSICIAN: Dr. Emily Filbert   REASON FOR ADMISSION: Hypotension, unable to be dialyzed. The patient fluid overload.   HISTORY OF PRESENT ILLNESS: This is a very nice 81 year old female with a history of end-stage renal disease, atrial fibrillation, morbid obesity, hypertension, type 2 diabetes insulin-dependent, sleep apnea on CPAP who comes after 2 days of not being able to be dialyzed due to low blood pressure. Last Tuesday, the patient went for dialysis and her blood pressure was too low, therefore, dialysis was stopped halfway through, and today she goes again and she is not able to be dialyzed for the same reason. The patient states within the last 2 days she is complaining of cough. There is some significant amount of sputum, but she states it is white and clear. She denies any fever and denies any significant pain or rashes. The patient states that there is not any significant sick contact with upper respiratory symptoms, but she said her husband has diarrhea, vomiting for the past 3 or 4 days. The patient overall states that she is getting a little bit weaker, lack of stamina. She has been put on midodrine, but her blood pressure continued to be low prior to dialysis. The patient states that she has been having significant shortness of breath. She is not able to get up and move around because every time she moves she had to stop and start again. The patient has not had any chest pain, syncope or any other episodes of passing out, but she does have orthopnea.   REVIEW OF SYSTEMS: Twelve system review of system is done.  CONSTITUTIONAL: No fever. Positive fatigue and weakness. Positive weight gain due to fluid overload, about 4 to 5 pounds.  EYES: No blurry vision, double vision.  EAR, NOSE AND THROAT: No difficulty swallowing or tinnitus.  RESPIRATORY: Positive cough. Positive  wheezing. Positive shortness of breath. Positive dyspnea.  CARDIOVASCULAR: No chest pain. Positive for orthopnea. Positive edema, no syncope.  GASTROINTESTINAL: No nausea, vomiting, constipation or diarrhea.  GENITOURINARY: No dysuria, hematuria, changes in frequency.  GYNECOLOGIC: No breast masses.  ENDOCRINE: No polyuria, polydipsia or polyphagia, cold or heat intolerance.  HEMATOLOGIC AND LYMPHATIC: No anemia, easy bruising or bleeding.  SKIN: No rashes or petechiae.  MUSCULOSKELETAL: No neck pain, back pain or other joint pains. She had some tenderness over the level of the right ankle which happens occasionally and is chronic.  NEUROLOGIC: No numbness or tingling, CVAs or TIAs. PSYCHIATRIC: No anxiety or depression.   PAST MEDICAL HISTORY: 1.  Morbid obesity.  2.  Hypertension.  3.  Atrial fibrillation.  4.  Chronic anticoagulation due to atrial fibrillation.  5.  Type 2 insulin-dependent diabetes.  6.  End-stage renal disease on dialysis.  7.  Sleep apnea, tolerating CPAP.  8.  Gout.  9.  Osteoarthritis.   PAST SURGICAL HISTORY: Bilateral knee replacement and cholecystectomy.   CURRENT MEDICATIONS:  Dictation ends here.  Please see addendum for continuation of dictation.   ____________________________ Jermyn Sink, MD rsg:cs D: 12/07/2013 18:12:00 ET T: 12/07/2013 20:04:49 ET JOB#: [id number]  cc: [place] Sink, MD, <Dictator> Chani Ghanem America Brown MD ELECTRONICALLY SIGNED 01/04/2014 0:43

## 2015-04-06 NOTE — Op Note (Signed)
PATIENT NAME:  [name], [name] MR#:  [id number] DATE OF BIRTH:  08/08/1934  DATE OF PROCEDURE:  06/19/2012  PREOPERATIVE DIAGNOSES:  1. End-stage renal disease.  2. Thrombosed left radiocephalic AV fistula.  3. Hypertension.   POSTOPERATIVE DIAGNOSES:  1. End-stage renal disease.  2. Thrombosed left radiocephalic AV fistula.  3. Hypertension.   PROCEDURES:  1. Ultrasound guidance for vascular access to left radial artery distal to the fistula.  2. Left upper extremity fistulogram and central venogram.  3. Catheter directed thrombolysis with 4 mg of TPA delivered with the AngioJet AVX catheter in a power pulse spray fashion to the fistula.  4. Mechanical rheolytic thrombectomy to the fistula.  5. Percutaneous transluminal angioplasty with 7 mm diameter angioplasty balloon for separate stenosis, one just beyond the anastomosis between the anastomosis and a previously placed stent and the second in the mid forearm cephalic vein.   SURGEON: Algernon Huxley, MD  ANESTHESIA: Local with moderate conscious sedation.   ESTIMATED BLOOD LOSS: Minimal.   INDICATION FOR PROCEDURE: A female well known to me for dialysis access needs. She presented today with a thrombosed fistula. Risks and benefits were discussed. Informed consent was obtained. We are attempting salvage of the fistula and a PermCath will be placed if we cannot get this open.    DESCRIPTION OF PROCEDURE: The patient's left upper extremity was sterilely prepped and draped and a sterile surgical field was created. The radial artery was accessed just distal to the anastomosis with a micropuncture needle. Under direct ultrasound guidance, a permanent image was recorded. Micropuncture wire and sheath were placed. Imaging through this sheath showed we were the fistula, which was thrombosed likely secondary to problems around the stent. I upsized to a 6 [place] sheath, gave 3000 units of intravenous heparin, put a Magic torque wire into the upper  arm. Catheter directed thrombolysis was performed with 4 mg of TPA from the anastomosis to the elbow. This was allowed to dwell for approximately 15 minutes then mechanical rheolytic thrombectomy was performed over similar segments. Following this, the acute thrombosis had resolved. I had uncovered two areas of stenosis which likely caused the fistula to thrombose. The first was between the anastomosis in a previously placed stent just beyond the anastomosis. This was treated initially with a 7 mm diameter angioplasty balloon with a tight waste which was taken and resolved after angioplasty. The second stenosis was in the mid forearm cephalic vein which also responded well to a 7 mm diameter angioplasty balloon with only approximately 20% residual stenosis. The remainder of the central venous upper arm circulation was patent. I elected to terminate the procedure. The sheath was removed around a 4-0 Monocryl pursestring suture. Pressure was held. Sterile dressing was placed. The patient tolerated procedure well.   ____________________________ Algernon Huxley, MD jsd:cms D: 06/19/2012 16:06:55 ET T: 06/20/2012 08:00:42 ET JOB#: [id number]  cc: Algernon Huxley, MD, <Dictator>  Algernon Huxley MD ELECTRONICALLY SIGNED 06/22/2012 13:46

## 2015-04-13 NOTE — Op Note (Signed)
PATIENT NAME:  [name], [name] MR#:  [id number] DATE OF BIRTH:  Mar 31, 1934  DATE OF PROCEDURE:  03/06/2015  PREOPERATIVE DIAGNOSIS: Displaced comminuted left distal femoral shaft fracture, periprosthetic above total knee.  POSTOPERATIVE DIAGNOSIS: Displaced comminuted left distal femoral shaft fracture, periprosthetic above total knee.   OPERATION PERFORMED: Open reduction and internal fixation, left femur with a Zimmer 10-hole periarticular plate and screws.   ANESTHESIA: General endotracheal.   COMPLICATIONS: None.   DRAINS: Two Autovac.   ESTIMATED BLOOD LOSS: 500 mL. Replaced: 1 unit of packed cells.   DESCRIPTION OF PROCEDURE: The patient was brought to the Operating Room, where she underwent satisfactory general endotracheal anesthesia in the supine position. A spinal was planned, but the patient became somewhat hypoxic after IV medications, and it was felt safer to avoid a spinal by anesthesia. She was placed on the operating room table, and the left leg was prepped and draped in sterile fashion. A long longitudinal lateral incision was made distal of the femoral shaft and dissection carried out sharply through subcutaneous tissue and fascia and the distal femoral condyle laterally was exposed. Lateral gynecoid artery was cauterized. Blunt dissection was used to lift the vastus lateralis muscle off the femur, from posterior to anterior. The fracture site was identified and seen to be highly comminuted. Open reduction was carried out as much as possible. This is a very heavy leg and was very, very difficult to move the fracture components around. The 10-hole Zimmer periarticular plate was passed up underneath the vastus lateralis and then placed on the lateral condyle of femur. It was pinned in place and then examined under fluoroscopy. It was moved a little bit more anteriorly and then affixed with 2 screws. The shaft of the femur was elevated and traction applied to the leg, as well. The plate  was clamped to the shaft with a turkey-claw clamp. Fluoroscopy was used to see that the plate was in good approximation to the bone, all the way up to the top. At this point, 2 cortical screws were placed in the shaft to stabilize the construct. Fluoroscopy showed the posterior bowing had been corrected and length was good. There was a slight amount of varus present. The remaining screw holes in the distal femur were filled, and cortical screws were placed up the shaft of the femur, extending the incision as needed. The most proximal hole was left unfilled. The fracture was stable and with good fixation proximally and distally. The fluoroscopy showed satisfactory positioning of the hardware in the fracture. The wounds were then irrigated and 2 Autovac drains left deep in the wound, #2 Brion Aliment was used to close the fascia and the subcutaneous tissue. Skin was closed with staples. Dry sterile dressing was applied. The Autovac was activated and a knee immobilizer was reapplied. The tourniquet was not used. The patient was then awakened and taken to the recovery room in good condition     ____________________________ Park Breed, MD hem:mw D: 03/06/2015 13:29:11 ET T: 03/06/2015 21:41:28 ET JOB#: [id number]  cc: Park Breed, MD, <Dictator> Park Breed MD ELECTRONICALLY SIGNED 03/11/2015 12:39

## 2015-04-13 NOTE — Consult Note (Signed)
Brief Consult Note: Diagnosis: Left femur fracture.   Patient was seen by consultant.   Recommend to proceed with surgery or procedure.   Recommend further assessment or treatment.   Orders entered.   Comments: 81 year old female fell at home today after returning from dialysis.  She twisted left leg and suffered a comminuted left distal femur fracture above her Total Knee Arthroplasty.  Brought to Emergency Room where exam and X-rays show the above injury.  Joint not involved.  She has renal failure for several years, Diabetes and other medical issues.  Medical service has cleared her for surgery tomorrow.  Have discussed this with the patient and her husband and explained surgical procedure to them to stabilize the fracture and get her mobilized.  Risks and benefits of surgery were discussed at length including but not limited to infection, non union, nerve or blood vessed damage, non union, need for repeat surgery, blood clots and lung emboli, and death. They are in agreement for surgery tomorrow.    Exam:  Alert and comfortable.  knee immobilizer in place.  circulation/sensation/motor function good distally and skin intact.  Pain with motion of femur.  Right leg normal and no other injuries noted.    X-rays:  as above  Plan:  open reduction and internal fixation left femur fracture tomorrow.  Electronic Signatures: Park Breed (MD)  (Signed 23-Mar-16 18:24)  Authored: Brief Consult Note   Last Updated: 23-Mar-16 18:24 by Park Breed (MD)

## 2015-04-13 NOTE — Consult Note (Signed)
Brief Consult Note: Diagnosis: Left ureteral stones (left UVJ and left renal pelvis).   Discussed with Attending MD.   Comments: Consult by Dr. Sabra Heck for a 3-4 mm left UVJ stone and a 1 mm left UPJ stone.the patient was admitted for left hip fracture.  She underwent repair.  CT of the abdomen and pelvis revealed a 3-4 mm stone at the left ureterovesical junction.  The stone was almost in the bladder.  There was no hydronephrosis.  There is also a smaller 1-2 mm stone at the left UPJ.  There were punctate stones in both kidneys.  I reviewed all the images.  Patient is on hemodialysis and does not make a lot of urine.  Her vitals have been stable.  I reviewed the notes, her labs and CT scan. Impression???small left UVJ stone, left UPJ stone Plan???would recommend continued surveillance.  Patient should pass these stones with peristalsis of the ureter (as evidenced by stone already to the UVJ). She would only need intervention for uncontrolled pain, obstruction in the setting of sepsis (white count, fever, hypotension, dirty U/A, etc.). Please reconsult if any questions or changes in patient status.  Electronic Signatures: Georgette Dover (MD)  (Signed 24-Mar-16 10:47)  Authored: Brief Consult Note   Last Updated: 24-Mar-16 10:47 by Georgette Dover (MD)

## 2015-04-13 NOTE — H&P (Signed)
PATIENT NAME:  [name], [name] MR#:  [id number] DATE OF BIRTH:  1934-12-05  DATE OF ADMISSION:  03/05/2015  PRIMARY CARE PHYSICIAN: Emily Filbert, MD   CHIEF COMPLAINT: Status post fall and left hip fracture.   HISTORY OF PRESENT ILLNESS: This is a 81 year old female who presents to the Emergency Room after suffering a mechanical fall in her home and having some left hip pain. The patient says that she was in her usual state of health, went for hemodialysis today, tolerated it well, came home and was attempting to get something off her bed when she slipped and fell. She fell to the left side of her body, hit her head, but did not lose consciousness. She also had some significant pain in her left hip side and has leg. She was brought to the ER for further evaluation. In the Emergency Room the patient had an x-ray which showed a left hip fracture. Hospitalist services were contacted for further treatment and evaluation. The patient denies any prodromal symptoms like any dizziness, palpitations, chest pain, nausea, vomiting, diaphoresis prior to her fall. She also denies losing consciousness or having any true syncope.   REVIEW OF SYSTEMS:  CONSTITUTIONAL: No documented fever. No weight gain, no weight loss.  EYES: No blurry or double vision.  ENT: No tinnitus. No postnasal drip. No redness of the oropharynx.  RESPIRATORY: No cough, no wheeze, no hemoptysis, no dyspnea.  CARDIOVASCULAR: No chest pain, no orthopnea, no palpitations, no syncope.  GASTROINTESTINAL: No nausea, no vomiting, no diarrhea. No abdominal pain. No melena or hematochezia.  GENITOURINARY: No dysuria or hematuria.  ENDOCRINE: No polyuria, nocturia. No heat or cold intolerance.  HEMATOLOGIC: No anemia, no bruising, no bleeding.  INTEGUMENTARY: No rashes. No lesions.  MUSCULOSKELETAL: No arthritis, no swelling, positive gout.  NEUROLOGIC: No numbness, tingling, or ataxia. No seizure activity.  PSYCHIATRIC: No anxiety. No insomnia. No  ADD.   PAST MEDICAL HISTORY: Consistent with end-stage renal disease, on hemodialysis Monday, Wednesday, and Friday. Hypertension, diabetes, history of chronic atrial fibrillation, essential tremor, history of gout, secondary hyperparathyroidism.   ALLERGIES: ACE INHIBITORS AND PENICILLIN. PENICILLIN CAUSES A RASH.   SOCIAL HISTORY: No smoking, no alcohol abuse. No illicit drug abuse. Lives at home with her husband.   FAMILY HISTORY: The patient's mother and father are both deceased. Mother died from complications of stomach cancer. Father had diabetes.   CURRENT MEDICATIONS: As follows: Tylenol with hydrocodone 5/325 one tablet q. 6 hours as needed, allopurinol 100 mg daily, amiodarone 200 mg 1/2 tablet daily, multivitamin daily, Humalog 75/25 with 50 units b.i.d., meclizine 25 mg t.i.d. as needed, Protonix 40 mg daily, propranolol 80 mg b.i.d., Sensipar 30 mg daily, Bactrim double strength 1 tab b.i.d., tizanidine 4 mg b.i.d., tramadol 50 mg q. 8 hours as needed.   PHYSICAL EXAMINATION: Presently is as follows:  VITAL SIGNS: Temperature is 97.5, pulse 65, respirations 20, blood pressure 136/60, saturations 95% on room air.  GENERAL: The patient is a pleasant-appearing female but in no apparent distress.  HEAD, EYES, EARS, NOSE AND THROAT: Atraumatic, normocephalic. Extraocular muscles are intact. Pupils equal and reactive to light. Sclerae anicteric. No conjunctival injection. No pharyngeal erythema.  NECK: Supple. There is no jugular venous distention. No bruits, no lymphadenopathy, no thyromegaly.  HEART: Regular rate and rhythm. No murmurs, no rubs, no clicks.  LUNGS: Clear to auscultation bilaterally. No rales or rhonchi. No wheezes.  ABDOMEN: Soft, flat, nontender, nondistended. Has good bowel sounds. No hepatosplenomegaly appreciated.  EXTREMITIES: No evidence  of any cyanosis, clubbing. She does have +1 to 2 pitting edema from the knees to the ankles bilaterally. Her left lower extremity  is externally rotated and shortened due to a hip fracture. The patient also has a left upper extremity AV fistula with good bruit and good thrill.  NEUROLOGIC: She is alert, awake, and oriented x 3 with no focal motor or sensory deficits appreciated bilaterally.  SKIN: Moist and warm with no rashes.  LYMPHATIC: There is no cervical or axillary lymphadenopathy.   LABORATORY DATA: Showed a white cell count of 16.5, hemoglobin 11.2, hematocrit 33.6, platelet count of 190,000. The patient's metabolic profile is still currently pending.   ASSESSMENT AND PLAN: This is a 81 year old female with a history of end-stage renal disease on hemodialysis, hypertension, diabetes, history of chronic atrial fibrillation, essential tremor presents to the hospital after a fall, noted to have a left hip fracture.  1.  Status post fall and left hip fracture. The patient is likely at moderate risk for noncardiac surgery. There are no absolute contraindications to surgery at this time. The patient's EKG has been reviewed, shows a normal sinus rhythm with normal axes, and no evidence of any acute ST or T wave changes. I will get an orthopedic consult to help with possible need for hip surgery. Also give her p.r.n. morphine for pain.  2.  End-stage renal disease, on hemodialysis. The patient did get her dialysis today. I will consult nephrology to get her dialyzed while in the hospital.  3.  Hypertension. The patient is presently hemodynamically stable. I will continue her propranolol.  4.  History of essential tremor. Also continue propranolol.  5.  History of chronic atrial fibrillation. The patient is currently rate controlled in a normal sinus rhythm. I will continue amiodarone. She is not on long-term anticoagulation given her high fall risk.  6.  Diabetes. I will continue her insulin 75/25 and sliding scale insulin, place on a carbohydrate-controlled diet, follow blood sugars.  7.  History of gout. Continue allopurinol.   8.  Gastroesophageal reflux disease. Continue Protonix.   CODE STATUS: The patient is a full code.   TIME SPENT ON ADMISSION: 50 minutes.    ____________________________ Belia Heman. Verdell Carmine, MD vjs:at D: 03/05/2015 15:32:05 ET T: 03/05/2015 16:30:10 ET JOB#: [id number]  cc: Belia Heman. Verdell Carmine, MD, <Dictator> Henreitta Leber MD ELECTRONICALLY SIGNED 03/20/2015 16:19

## 2015-04-13 NOTE — Consult Note (Signed)
Brief Consult Note: Diagnosis: Left ureteral stones (left UVJ and left renal pelvis).   Comments: Consult by Dr. Sabra Heck for a 3-4 mm left UVJ stone and a 1 mm left UPJ stone.the patient was admitted for left hip fracture.  She underwent repair.  CT of the abdomen and pelvis revealed a 3-4 mm stone at the left ureterovesical junction.  The stone was almost in the bladder.  There was no hydronephrosis.  There is also a smaller 1-2 mm stone at the left UPJ.  There were punctate stones in both kidneys.   Patient is on hemodialysis and does not make a lot of urine.  Her vitals have remained stable.  I reviewed the notes, her labs and CT scan. -Patient reports complete resolution of previous right sided abdominal pain.  Continues to remain asymptomatic on left side.  She is to be discharged today after hemodialysis to [facility].  Impression???small left UVJ stone, left UPJ stone Plan???would recommend continued surveillance.  Patient should pass these stones with peristalsis of the ureter (as evidenced by stone already to the UVJ). She would only need intervention for uncontrolled pain, obstruction in the setting of sepsis (white count, fever, hypotension, dirty U/A, etc.). If patient remains asymptomatic recommend follow up at our office in 2-3 weeks for recheck and possible renal US to confirm stone passage.  Recomend patient's urine be strained for stones if possible.  Please reconsult if any questions or changes in patient status. [facility] @ [phone]  to schedule follow up appt.  Electronic Signatures: Herbert Moors (NP)  (Signed 28-Mar-16 09:10)  Authored: Brief Consult Note   Last Updated: 28-Mar-16 09:10 by Herbert Moors (NP)

## 2015-04-13 NOTE — Discharge Summary (Signed)
PATIENT NAME:  [name], CAVAN MR#:  [id number] DATE OF BIRTH:  02-17-1934  DATE OF ADMISSION:  03/05/2015 DATE OF DISCHARGE:  03/10/2015  The patient is going to skilled nursing tomorrow.   DISCHARGE DIAGNOSES:  1. Left femoral neck fracture.  2. Hypotension.  3. Bronchitis.  4. Heparin-induced thrombocytopenia.  5. Severe postoperative anemia, discharge hemoglobin 8.5.  6. Diabetes mellitus, insulin requiring.  7. End-stage renal disease, on hemodialysis Monday, Wednesday, Friday.  8. Chronic atrial fibrillation.  9. Essential tremor.  10. Gout.  11. Secondary hyperparathyroidism.   DISCHARGE MEDICATIONS: Meclizine 25 mg t.i.d. p.r.n., multivitamin daily, pantoprazole 40 mg daily, Humalog mix 75/25 insulin 50 units before meals b.i.d., amiodarone 100 mg daily, tramadol 150 mg q.8 h. p.r.n. pain, propranolol LA 80 mg b.i.d., allopurinol 100 mg daily, Sensipar 30 mg daily, Percocet 5/325 one tablet q.4 h. p.r.n. pain, ferrous sulfate 325 mg b.i.d., Dulcolax 10 mg daily, Colace at bedtime, MiraLax 17 grams daily, Ceftin 250 mg b.i.d. x 1 week, calcium and vitamin D 500/200 b.i.d.   REASON FOR ADMISSION: A 81 year old female presents with a left femoral neck fracture and hypotension with cough. Please see the history and physical for HPI, past medical history and physical examination.   HOSPITAL COURSE: The patient was admitted. She underwent left hip pinning with fairly good results. Had significant constipation, afterward treated with medicines above. Her hemoglobin dropped to 7.7 and then was 8.5 on discharge, and will continue on b.i.d. iron. Her platelets dropped to 70,000 on subcutaneous heparin, and that was discontinued, and her platelets are back in the normal range on discharge. Sugars were reasonably controlled throughout the course. She was slightly hypotensive and was treated with some low dose normal saline. Bronchitis was treated with SVN, chest x-ray negative, discharged on Ceftin.    Overall prognosis is guarded.    ____________________________ Rusty Aus, MD mfm:JT D: 03/09/2015 10:37:56 ET T: 03/09/2015 11:10:28 ET JOB#: [id number]  cc: Rusty Aus, MD, <Dictator> Rusty Aus MD ELECTRONICALLY SIGNED 03/12/2015 8:28

## 2015-07-21 ENCOUNTER — Other Ambulatory Visit: Payer: Self-pay | Admitting: Internal Medicine

## 2015-07-21 DIAGNOSIS — R634 Abnormal weight loss: Secondary | ICD-10-CM

## 2015-07-21 DIAGNOSIS — R05 Cough: Secondary | ICD-10-CM

## 2015-07-21 DIAGNOSIS — R053 Chronic cough: Secondary | ICD-10-CM

## 2015-07-24 ENCOUNTER — Ambulatory Visit
Admission: RE | Admit: 2015-07-24 | Discharge: 2015-07-24 | Disposition: A | Discharge status: Home / Self Care | Payer: Medicare Other | Source: Ambulatory Visit | Attending: Internal Medicine | Admitting: Internal Medicine

## 2015-07-24 DIAGNOSIS — R05 Cough: Secondary | ICD-10-CM

## 2015-07-24 DIAGNOSIS — R634 Abnormal weight loss: Secondary | ICD-10-CM | POA: Insufficient documentation

## 2015-07-24 DIAGNOSIS — J90 Pleural effusion, not elsewhere classified: Secondary | ICD-10-CM | POA: Diagnosis not present

## 2015-07-24 DIAGNOSIS — I2510 Atherosclerotic heart disease of native coronary artery without angina pectoris: Secondary | ICD-10-CM | POA: Diagnosis not present

## 2015-07-24 DIAGNOSIS — I517 Cardiomegaly: Secondary | ICD-10-CM | POA: Insufficient documentation

## 2015-07-24 DIAGNOSIS — R053 Chronic cough: Secondary | ICD-10-CM

## 2015-07-24 DIAGNOSIS — R918 Other nonspecific abnormal finding of lung field: Secondary | ICD-10-CM | POA: Diagnosis not present

## 2015-07-24 HISTORY — DX: Unspecified malignant neoplasm of skin, unspecified: C44.90

## 2015-07-24 HISTORY — DX: Disorder of kidney and ureter, unspecified: N28.9

## 2015-07-24 HISTORY — DX: Type 2 diabetes mellitus without complications: E11.9

## 2015-07-24 MED ORDER — IOHEXOL 300 MG/ML  SOLN
100.0000 mL | Freq: Once | INTRAMUSCULAR | Status: AC | PRN
  Administered 2015-07-24: 100 mL via INTRAVENOUS

## 2015-08-02 ENCOUNTER — Encounter: Payer: Self-pay | Admitting: Emergency Medicine

## 2015-08-02 ENCOUNTER — Other Ambulatory Visit: Payer: Self-pay

## 2015-08-02 ENCOUNTER — Inpatient Hospital Stay
Admission: EM | Admit: 2015-08-02 | Discharge: 2015-08-11 | DRG: 166 | Disposition: A | Discharge status: Skilled Nursing Facility | Payer: Medicare Other | Attending: Internal Medicine | Admitting: Internal Medicine

## 2015-08-02 ENCOUNTER — Emergency Department: Payer: Medicare Other

## 2015-08-02 DIAGNOSIS — T82510A Breakdown (mechanical) of surgically created arteriovenous fistula, initial encounter: Secondary | ICD-10-CM | POA: Diagnosis present

## 2015-08-02 DIAGNOSIS — G250 Essential tremor: Secondary | ICD-10-CM | POA: Diagnosis present

## 2015-08-02 DIAGNOSIS — K3184 Gastroparesis: Secondary | ICD-10-CM | POA: Diagnosis present

## 2015-08-02 DIAGNOSIS — J189 Pneumonia, unspecified organism: Secondary | ICD-10-CM | POA: Diagnosis present

## 2015-08-02 DIAGNOSIS — I2510 Atherosclerotic heart disease of native coronary artery without angina pectoris: Secondary | ICD-10-CM | POA: Diagnosis present

## 2015-08-02 DIAGNOSIS — T82858A Stenosis of vascular prosthetic devices, implants and grafts, initial encounter: Secondary | ICD-10-CM | POA: Diagnosis present

## 2015-08-02 DIAGNOSIS — J690 Pneumonitis due to inhalation of food and vomit: Secondary | ICD-10-CM | POA: Diagnosis present

## 2015-08-02 DIAGNOSIS — I4891 Unspecified atrial fibrillation: Secondary | ICD-10-CM

## 2015-08-02 DIAGNOSIS — D631 Anemia in chronic kidney disease: Secondary | ICD-10-CM | POA: Diagnosis present

## 2015-08-02 DIAGNOSIS — I272 Other secondary pulmonary hypertension: Secondary | ICD-10-CM | POA: Diagnosis present

## 2015-08-02 DIAGNOSIS — N186 End stage renal disease: Secondary | ICD-10-CM | POA: Diagnosis present

## 2015-08-02 DIAGNOSIS — Z880 Allergy status to penicillin: Secondary | ICD-10-CM

## 2015-08-02 DIAGNOSIS — I481 Persistent atrial fibrillation: Secondary | ICD-10-CM | POA: Diagnosis not present

## 2015-08-02 DIAGNOSIS — Z90710 Acquired absence of both cervix and uterus: Secondary | ICD-10-CM

## 2015-08-02 DIAGNOSIS — Z96653 Presence of artificial knee joint, bilateral: Secondary | ICD-10-CM | POA: Diagnosis present

## 2015-08-02 DIAGNOSIS — E1143 Type 2 diabetes mellitus with diabetic autonomic (poly)neuropathy: Secondary | ICD-10-CM | POA: Diagnosis present

## 2015-08-02 DIAGNOSIS — B9561 Methicillin susceptible Staphylococcus aureus infection as the cause of diseases classified elsewhere: Secondary | ICD-10-CM | POA: Diagnosis present

## 2015-08-02 DIAGNOSIS — I959 Hypotension, unspecified: Secondary | ICD-10-CM | POA: Diagnosis present

## 2015-08-02 DIAGNOSIS — E119 Type 2 diabetes mellitus without complications: Secondary | ICD-10-CM | POA: Diagnosis present

## 2015-08-02 DIAGNOSIS — J9601 Acute respiratory failure with hypoxia: Secondary | ICD-10-CM | POA: Diagnosis not present

## 2015-08-02 DIAGNOSIS — G4733 Obstructive sleep apnea (adult) (pediatric): Secondary | ICD-10-CM | POA: Diagnosis present

## 2015-08-02 DIAGNOSIS — K7460 Unspecified cirrhosis of liver: Secondary | ICD-10-CM | POA: Diagnosis present

## 2015-08-02 DIAGNOSIS — J918 Pleural effusion in other conditions classified elsewhere: Secondary | ICD-10-CM | POA: Diagnosis present

## 2015-08-02 DIAGNOSIS — Z9049 Acquired absence of other specified parts of digestive tract: Secondary | ICD-10-CM | POA: Diagnosis present

## 2015-08-02 DIAGNOSIS — J441 Chronic obstructive pulmonary disease with (acute) exacerbation: Secondary | ICD-10-CM | POA: Diagnosis present

## 2015-08-02 DIAGNOSIS — Z85828 Personal history of other malignant neoplasm of skin: Secondary | ICD-10-CM

## 2015-08-02 DIAGNOSIS — Z9851 Tubal ligation status: Secondary | ICD-10-CM | POA: Diagnosis not present

## 2015-08-02 DIAGNOSIS — M1990 Unspecified osteoarthritis, unspecified site: Secondary | ICD-10-CM | POA: Diagnosis present

## 2015-08-02 DIAGNOSIS — J90 Pleural effusion, not elsewhere classified: Secondary | ICD-10-CM | POA: Diagnosis not present

## 2015-08-02 DIAGNOSIS — R05 Cough: Secondary | ICD-10-CM

## 2015-08-02 DIAGNOSIS — J9600 Acute respiratory failure, unspecified whether with hypoxia or hypercapnia: Secondary | ICD-10-CM | POA: Diagnosis present

## 2015-08-02 DIAGNOSIS — N2581 Secondary hyperparathyroidism of renal origin: Secondary | ICD-10-CM | POA: Diagnosis present

## 2015-08-02 DIAGNOSIS — R296 Repeated falls: Secondary | ICD-10-CM | POA: Diagnosis present

## 2015-08-02 DIAGNOSIS — Z794 Long term (current) use of insulin: Secondary | ICD-10-CM

## 2015-08-02 DIAGNOSIS — J9811 Atelectasis: Secondary | ICD-10-CM | POA: Diagnosis present

## 2015-08-02 DIAGNOSIS — Z833 Family history of diabetes mellitus: Secondary | ICD-10-CM

## 2015-08-02 DIAGNOSIS — I480 Paroxysmal atrial fibrillation: Secondary | ICD-10-CM | POA: Diagnosis present

## 2015-08-02 DIAGNOSIS — Z87442 Personal history of urinary calculi: Secondary | ICD-10-CM

## 2015-08-02 DIAGNOSIS — L409 Psoriasis, unspecified: Secondary | ICD-10-CM | POA: Diagnosis present

## 2015-08-02 DIAGNOSIS — Z992 Dependence on renal dialysis: Secondary | ICD-10-CM

## 2015-08-02 DIAGNOSIS — E669 Obesity, unspecified: Secondary | ICD-10-CM | POA: Diagnosis present

## 2015-08-02 DIAGNOSIS — Z452 Encounter for adjustment and management of vascular access device: Secondary | ICD-10-CM

## 2015-08-02 DIAGNOSIS — Z9889 Other specified postprocedural states: Secondary | ICD-10-CM | POA: Diagnosis not present

## 2015-08-02 DIAGNOSIS — T888XXA Other specified complications of surgical and medical care, not elsewhere classified, initial encounter: Secondary | ICD-10-CM

## 2015-08-02 DIAGNOSIS — I081 Rheumatic disorders of both mitral and tricuspid valves: Secondary | ICD-10-CM | POA: Diagnosis present

## 2015-08-02 DIAGNOSIS — Z8701 Personal history of pneumonia (recurrent): Secondary | ICD-10-CM

## 2015-08-02 DIAGNOSIS — Z6841 Body Mass Index (BMI) 40.0 and over, adult: Secondary | ICD-10-CM

## 2015-08-02 DIAGNOSIS — I120 Hypertensive chronic kidney disease with stage 5 chronic kidney disease or end stage renal disease: Secondary | ICD-10-CM | POA: Diagnosis present

## 2015-08-02 DIAGNOSIS — E43 Unspecified severe protein-calorie malnutrition: Secondary | ICD-10-CM | POA: Insufficient documentation

## 2015-08-02 DIAGNOSIS — R059 Cough, unspecified: Secondary | ICD-10-CM

## 2015-08-02 HISTORY — DX: Essential (primary) hypertension: I10

## 2015-08-02 HISTORY — DX: Unspecified atrial fibrillation: I48.91

## 2015-08-02 HISTORY — DX: Essential tremor: G25.0

## 2015-08-02 HISTORY — DX: Unspecified osteoarthritis, unspecified site: M19.90

## 2015-08-02 HISTORY — DX: End stage renal disease: N18.6

## 2015-08-02 HISTORY — DX: Localized edema: R60.0

## 2015-08-02 HISTORY — DX: Obstructive sleep apnea (adult) (pediatric): G47.33

## 2015-08-02 LAB — COMPREHENSIVE METABOLIC PANEL
ALK PHOS: 192 U/L — AB (ref 38–126)
ALT: 9 U/L — ABNORMAL LOW (ref 14–54)
ANION GAP: 10 (ref 5–15)
AST: 21 U/L (ref 15–41)
Albumin: 2.6 g/dL — ABNORMAL LOW (ref 3.5–5.0)
BILIRUBIN TOTAL: 1 mg/dL (ref 0.3–1.2)
BUN: 14 mg/dL (ref 6–20)
CALCIUM: 8 mg/dL — AB (ref 8.9–10.3)
CO2: 35 mmol/L — ABNORMAL HIGH (ref 22–32)
Chloride: 93 mmol/L — ABNORMAL LOW (ref 101–111)
Creatinine, Ser: 4.63 mg/dL — ABNORMAL HIGH (ref 0.44–1.00)
GFR calc non Af Amer: 8 mL/min — ABNORMAL LOW (ref >60)
GFR, EST AFRICAN AMERICAN: 9 mL/min — AB (ref >60)
GLUCOSE: 115 mg/dL — AB (ref 65–99)
Potassium: 4 mmol/L (ref 3.5–5.1)
Sodium: 138 mmol/L (ref 135–145)
TOTAL PROTEIN: 6.9 g/dL (ref 6.5–8.1)

## 2015-08-02 LAB — GLUCOSE, CAPILLARY
GLUCOSE-CAPILLARY: 82 mg/dL (ref 65–99)
GLUCOSE-CAPILLARY: 132 mg/dL — AB (ref 65–99)
GLUCOSE-CAPILLARY: 251 mg/dL — AB (ref 65–99)
Glucose-Capillary: 59 mg/dL — ABNORMAL LOW (ref 65–99)

## 2015-08-02 LAB — CBC WITH DIFFERENTIAL/PLATELET
Basophils Absolute: 0.1 10*3/uL (ref 0–0.1)
Basophils Relative: 1 %
Eosinophils Absolute: 0.2 10*3/uL (ref 0–0.7)
Eosinophils Relative: 2 %
HEMATOCRIT: 32.9 % — AB (ref 35.0–47.0)
Hemoglobin: 10.7 g/dL — ABNORMAL LOW (ref 12.0–16.0)
LYMPHS ABS: 1.7 10*3/uL (ref 1.0–3.6)
LYMPHS PCT: 11 %
MCH: 33.7 pg (ref 26.0–34.0)
MCHC: 32.6 g/dL (ref 32.0–36.0)
MCV: 103.3 fL — AB (ref 80.0–100.0)
MONOS PCT: 10 %
Monocytes Absolute: 1.6 10*3/uL — ABNORMAL HIGH (ref 0.2–0.9)
NEUTROS ABS: 11.5 10*3/uL — AB (ref 1.4–6.5)
Neutrophils Relative %: 76 %
Platelets: 278 10*3/uL (ref 150–440)
RBC: 3.18 MIL/uL — ABNORMAL LOW (ref 3.80–5.20)
RDW: 15 % — ABNORMAL HIGH (ref 11.5–14.5)
WBC: 15.1 10*3/uL — ABNORMAL HIGH (ref 3.6–11.0)

## 2015-08-02 LAB — LIPASE, BLOOD: LIPASE: 17 U/L — AB (ref 22–51)

## 2015-08-02 LAB — MRSA PCR SCREENING: MRSA BY PCR: POSITIVE — AB

## 2015-08-02 LAB — TROPONIN I: Troponin I: 0.03 ng/mL (ref <0.031)

## 2015-08-02 MED ORDER — SODIUM CHLORIDE 0.9 % IV BOLUS (SEPSIS)
1000.0000 mL | Freq: Once | INTRAVENOUS | Status: AC
  Administered 2015-08-02: 1000 mL via INTRAVENOUS
  Filled 2015-08-02: qty 1000

## 2015-08-02 MED ORDER — SENNA 8.6 MG PO TABS: 1.0000 | ORAL_TABLET | Freq: Every day | ORAL | Status: DC | PRN

## 2015-08-02 MED ORDER — DILTIAZEM HCL 25 MG/5ML IV SOLN
10.0000 mg | Freq: Once | INTRAVENOUS | Status: AC
  Administered 2015-08-02: 10 mg via INTRAVENOUS
  Filled 2015-08-02: qty 5

## 2015-08-02 MED ORDER — IPRATROPIUM-ALBUTEROL 0.5-2.5 (3) MG/3ML IN SOLN
3.0000 mL | Freq: Once | RESPIRATORY_TRACT | Status: AC
  Administered 2015-08-02: 3 mL via RESPIRATORY_TRACT
  Filled 2015-08-02: qty 3

## 2015-08-02 MED ORDER — LEVOFLOXACIN 500 MG PO TABS: 500.0000 mg | ORAL_TABLET | ORAL | Status: DC

## 2015-08-02 MED ORDER — LEVOFLOXACIN IN D5W 750 MG/150ML IV SOLN
750.0000 mg | Freq: Once | INTRAVENOUS | Status: AC
  Administered 2015-08-02: 750 mg via INTRAVENOUS
  Filled 2015-08-02: qty 150

## 2015-08-02 MED ORDER — INSULIN ASPART 100 UNIT/ML ~~LOC~~ SOLN
0.0000 [IU] | Freq: Every day | SUBCUTANEOUS | Status: DC
  Administered 2015-08-02: 3 [IU] via SUBCUTANEOUS
  Administered 2015-08-05 – 2015-08-08 (×2): 2 [IU] via SUBCUTANEOUS
  Administered 2015-08-10: 3 [IU] via SUBCUTANEOUS
  Filled 2015-08-02 (×2): qty 3

## 2015-08-02 MED ORDER — ONDANSETRON HCL 4 MG/2ML IJ SOLN
4.0000 mg | Freq: Four times a day (QID) | INTRAMUSCULAR | Status: DC | PRN
  Administered 2015-08-09: 4 mg via INTRAVENOUS
  Filled 2015-08-02: qty 2

## 2015-08-02 MED ORDER — IPRATROPIUM-ALBUTEROL 0.5-2.5 (3) MG/3ML IN SOLN
3.0000 mL | Freq: Once | RESPIRATORY_TRACT | Status: AC
  Administered 2015-08-02: 3 mL via RESPIRATORY_TRACT

## 2015-08-02 MED ORDER — INSULIN ASPART 100 UNIT/ML ~~LOC~~ SOLN
0.0000 [IU] | Freq: Three times a day (TID) | SUBCUTANEOUS | Status: DC
  Administered 2015-08-03: 2 [IU] via SUBCUTANEOUS
  Administered 2015-08-03: 3 [IU] via SUBCUTANEOUS
  Administered 2015-08-03 – 2015-08-04 (×2): 2 [IU] via SUBCUTANEOUS
  Administered 2015-08-04 – 2015-08-05 (×2): 3 [IU] via SUBCUTANEOUS
  Administered 2015-08-05 – 2015-08-07 (×6): 2 [IU] via SUBCUTANEOUS
  Administered 2015-08-07: 7 [IU] via SUBCUTANEOUS
  Administered 2015-08-08: 2 [IU] via SUBCUTANEOUS
  Administered 2015-08-08 (×2): 3 [IU] via SUBCUTANEOUS
  Administered 2015-08-09 (×2): 2 [IU] via SUBCUTANEOUS
  Administered 2015-08-09: 5 [IU] via SUBCUTANEOUS
  Administered 2015-08-10: 3 [IU] via SUBCUTANEOUS
  Administered 2015-08-10: 5 [IU] via SUBCUTANEOUS
  Administered 2015-08-11 (×2): 2 [IU] via SUBCUTANEOUS
  Filled 2015-08-02 (×3): qty 2
  Filled 2015-08-02 (×2): qty 3
  Filled 2015-08-02 (×2): qty 2
  Filled 2015-08-02: qty 7
  Filled 2015-08-02: qty 2
  Filled 2015-08-02: qty 3
  Filled 2015-08-02 (×8): qty 2
  Filled 2015-08-02: qty 3
  Filled 2015-08-02 (×3): qty 2
  Filled 2015-08-02 (×2): qty 5
  Filled 2015-08-02: qty 3

## 2015-08-02 MED ORDER — DIGOXIN 0.25 MG/ML IJ SOLN
0.2500 mg | INTRAMUSCULAR | Status: AC
  Administered 2015-08-02: 0.25 mg via INTRAVENOUS
  Filled 2015-08-02: qty 2

## 2015-08-02 MED ORDER — SODIUM CHLORIDE 0.9 % IV SOLN
INTRAVENOUS | Status: DC
  Administered 2015-08-02 – 2015-08-03 (×2): via INTRAVENOUS

## 2015-08-02 MED ORDER — BENZONATATE 100 MG PO CAPS
100.0000 mg | ORAL_CAPSULE | Freq: Three times a day (TID) | ORAL | Status: DC | PRN
  Administered 2015-08-02 – 2015-08-07 (×5): 100 mg via ORAL
  Filled 2015-08-02 (×5): qty 1

## 2015-08-02 MED ORDER — ONDANSETRON HCL 4 MG/2ML IJ SOLN
4.0000 mg | Freq: Once | INTRAMUSCULAR | Status: AC
  Administered 2015-08-02: 4 mg via INTRAVENOUS
  Filled 2015-08-02: qty 2

## 2015-08-02 MED ORDER — DOCUSATE SODIUM 100 MG PO CAPS
100.0000 mg | ORAL_CAPSULE | Freq: Two times a day (BID) | ORAL | Status: DC | PRN
  Filled 2015-08-02: qty 1

## 2015-08-02 MED ORDER — SODIUM CHLORIDE 0.9 % IV BOLUS (SEPSIS)
500.0000 mL | Freq: Once | INTRAVENOUS | Status: AC
  Administered 2015-08-02: 500 mL via INTRAVENOUS

## 2015-08-02 MED ORDER — ACETAMINOPHEN 650 MG RE SUPP
650.0000 mg | Freq: Four times a day (QID) | RECTAL | Status: DC | PRN
Start: 2015-08-02 — End: 2015-08-11

## 2015-08-02 MED ORDER — LEVOFLOXACIN 500 MG PO TABS: 500.0000 mg | ORAL_TABLET | Freq: Every day | ORAL | Status: DC

## 2015-08-02 MED ORDER — SODIUM CHLORIDE 0.9 % IV BOLUS (SEPSIS)
250.0000 mL | Freq: Once | INTRAVENOUS | Status: AC
  Administered 2015-08-02: 250 mL via INTRAVENOUS

## 2015-08-02 MED ORDER — DEXTROSE 5 % IV SOLN: 1.0000 g | INTRAVENOUS | Status: DC

## 2015-08-02 MED ORDER — HEPARIN SODIUM (PORCINE) 5000 UNIT/ML IJ SOLN
5000.0000 [IU] | Freq: Three times a day (TID) | INTRAMUSCULAR | Status: DC
  Administered 2015-08-02 – 2015-08-06 (×12): 5000 [IU] via SUBCUTANEOUS
  Filled 2015-08-02 (×12): qty 1

## 2015-08-02 MED ORDER — IPRATROPIUM-ALBUTEROL 0.5-2.5 (3) MG/3ML IN SOLN
3.0000 mL | RESPIRATORY_TRACT | Status: DC
  Administered 2015-08-02 – 2015-08-07 (×21): 3 mL via RESPIRATORY_TRACT
  Filled 2015-08-02 (×25): qty 3

## 2015-08-02 MED ORDER — ACETAMINOPHEN 325 MG PO TABS
650.0000 mg | ORAL_TABLET | Freq: Four times a day (QID) | ORAL | Status: DC | PRN
  Administered 2015-08-02 – 2015-08-06 (×2): 650 mg via ORAL
  Filled 2015-08-02 (×2): qty 2

## 2015-08-02 MED ORDER — IPRATROPIUM-ALBUTEROL 0.5-2.5 (3) MG/3ML IN SOLN
3.0000 mL | Freq: Once | RESPIRATORY_TRACT | Status: AC
  Administered 2015-08-02: 3 mL via RESPIRATORY_TRACT
  Filled 2015-08-02: qty 9

## 2015-08-02 NOTE — ED Provider Notes (Signed)
[facility] Emergency Department Provider Note  Time seen: 9:13 AM  I have reviewed the triage vital signs and the nursing notes.   HISTORY  Chief Complaint Weakness and Emesis    HPI [name] is a 81 y.o. female with a past medical history of diabetes presents the emergency department with cough and congestion. According to the patient she has been coughing for the past 5 months. She has been seen by her primary care doctor 4 times for the same, recently had a CAT scan done approximately 1.5 weeks ago. Is currently being referred to a pulmonologist for further evaluation. Patient states she has undergone several rounds of antibiotics without effect. She states for the past 2 weeks her sputum has turned yellow-green color, and she occasionally vomits after coughing up sputum. States increased weakness due to her cough. States she came to the emergency department today for evaluation because she wants to know what is going on. Denies any chest pain, abdominal pain, diarrhea, black or bloody stool, vomit, or sputum.   Past Medical History  Diagnosis Date  . Skin cancer     Resected from legs  . Renal insufficiency     Patient is on dialysis and normal days are M,W and F.  . Diabetes mellitus without complication     Patient takes Insulin    There are no active problems to display for this patient.   Past Surgical History  Procedure Laterality Date  . Abdominal hysterectomy      No current outpatient prescriptions on file.  Allergies Penicillins  No family history on file.  Social History Social History  Substance Use Topics  . Smoking status: Never Smoker   . Smokeless tobacco: Never Used  . Alcohol Use: No    Review of Systems Constitutional: Negative for fever. Cardiovascular: Negative for chest pain. Respiratory: Positive for shortness of breath, cough with sputum production. Gastrointestinal: Negative for abdominal pain. Positive  for nausea and vomiting. Neurological: Negative for headaches, focal weakness or numbness 10-point ROS otherwise negative.  ____________________________________________   PHYSICAL EXAM:  VITAL SIGNS: ED Triage Vitals  Enc Vitals Group     BP 08/02/15 0858 101/53 mmHg     Pulse Rate 08/02/15 0858 96     Resp 08/02/15 0858 18     Temp 08/02/15 0858 98.1 F (36.7 C)     Temp Source 08/02/15 0858 Oral     SpO2 08/02/15 0858 100 %     Weight 08/02/15 0858 209 lb (94.802 kg)     Height 08/02/15 0858 5\' 1"  (1.549 m)     Head Cir --      Peak Flow --      Pain Score --      Pain Loc --      Pain Edu? --      Excl. in [place]? --     Constitutional: Alert and oriented. Well appearing and in no distress. Eyes: Normal exam ENT   Head: Normocephalic and atraumatic. Cardiovascular: Normal rate, regular rhythm. Respiratory: Normal respiratory effort without tachypnea nor retractions. Breath sounds are clear and equal bilaterally. No wheezes/rales/rhonchi. Frequent cough. Gastrointestinal: Soft and nontender. No distention.   Musculoskeletal: Nontender with normal range of motion in all extremities. No lower extremity tenderness Neurologic:  Normal speech and language. No gross focal neurologic deficits  Skin:  Skin is warm, dry and intact.  Psychiatric: Mood and affect are normal. Speech and behavior are normal.  ____________________________________________  EKG  EKG reviewed and interpreted by myself shows a junctional rhythm at 108 bpm, narrow QRS, normal axis, nonspecific ST changes present. No ST elevations noted.  ____________________________________________    RADIOLOGY  Chest x-ray consistent with effusions, consolidation.  ____________________________________________    INITIAL IMPRESSION / ASSESSMENT AND PLAN / ED COURSE  Pertinent labs & imaging results that were available during my care of the patient were reviewed by me and considered in my medical decision  making (see chart for details).  Patient with persisting cough 5 months, now with yellow/green sputum and occasional nausea/vomiting with her cough. We will check labs, chest x-ray, and closely monitor in the emergency department.  Chest x-ray consistent with bilateral effusions and consolidation. Patient's recent chest CT shows bilateral lower lobe infiltrate as well as a right upper lobe infiltrate which they believe is most consistent with aspiration pneumonia.  Patient with continued elevated white blood cell count labs are otherwise at her baseline. Patient has a pneumonia severity index of class III, we'll begin IV antibiotics, check blood cultures and admit the patient to the hospital given her diffuse generalized weakness and worsening symptoms at home.  ____________________________________________   FINAL CLINICAL IMPRESSION(S) / ED DIAGNOSES  Bronchitis Pneumonia    Harvest Dark, MD 08/02/15 1035

## 2015-08-02 NOTE — ED Notes (Signed)
Patient is resting comfortably. 

## 2015-08-02 NOTE — Progress Notes (Signed)
Notified Dr. Tressia Miners that patient was given the 250cc bolus per her telephone order when blood pressure dropped to 88/46 manually. After bolus BP rose to 100/44. Patient is not symptomatic from blood pressure. Was symptopmatic from blood sugar dropping, CBG is now 132. Also notified MD that patient is diabetic and needs ACHS CBG and sliding scale checks. MD will order.

## 2015-08-02 NOTE — H&P (Addendum)
[facility] [place] NAME: [name]    MR#:  [id number]  DATE OF BIRTH:  04-21-1934  DATE OF ADMISSION:  08/02/2015  PRIMARY CARE PHYSICIAN: Rusty Aus., MD   REQUESTING/REFERRING PHYSICIAN: Dr. Harvest Dark  CHIEF COMPLAINT:   Chief Complaint  Patient presents with  . Weakness  . Emesis    HISTORY OF PRESENT ILLNESS:  [name]  is a 81 y.o. female with a known history of end-stage renal disease on Monday Wednesday Friday dialysis, hypertension, atrial fibrillation not on anticoagulation, insulin-dependent diabetes mellitus, arthritis presents from home secondary to worsening cough and difficulty breathing. Patient has had her femoral fracture surgery done in March 2016 and since then she has been having multiple complications according to the husband. She had a bout of pneumonia at that time was on antibiotics and continued to have respiratory symptoms about 2 weeks ago and had a CT chest done at that time and had 2 courses of Z-Pak as an outpatient. She also has been having postprandial nausea and vomiting for which she is supposed to see GI soon for her gastroparesis. Now her breathing has become more erratic, she has become more weak, she's been having postnasal drip and nighttime worsening cough with greenish phlegm production at this time. She was having chills, no recordable fever at home and presented to the emergency room. Here her heart rate is elevated in the 120s and she has a white count of 15 and is being admitted for sepsis secondary to pneumonia. 2 chest x-ray does not show a whole lot of changes she has clear evidence of bronchitis/pneumonia.  PAST MEDICAL HISTORY:   Past Medical History  Diagnosis Date  . Skin cancer     Resected from legs  . Renal insufficiency     Patient is on dialysis and normal days are M,W and F.  . Diabetes mellitus without complication     Patient takes Insulin  . ESRD (end  stage renal disease)     Monday, wednesday, Friday DIalysis  . HTN (hypertension)   . Essential tremor   . OSA (obstructive sleep apnea)   . A-fib     not on anticoagulation  . Bilateral lower extremity edema     PAST SURGICAL HISTORY:   Past Surgical History  Procedure Laterality Date  . Abdominal hysterectomy    . Cholecystectomy    . Femur fracture surgery      left femur  . Tubal ligation    . Ankle fracture surgery      right ankle    SOCIAL HISTORY:   Social History  Substance Use Topics  . Smoking status: Never Smoker   . Smokeless tobacco: Never Used  . Alcohol Use: No    FAMILY HISTORY:   Family History  Problem Relation Age of Onset  . Diabetes type II Father     DRUG ALLERGIES:   Allergies  Allergen Reactions  . Penicillins Rash    REVIEW OF SYSTEMS:   Review of Systems  Constitutional: Positive for malaise/fatigue. Negative for fever, chills and weight loss.  HENT: Positive for congestion. Negative for ear discharge, ear pain, hearing loss, nosebleeds and tinnitus.   Eyes: Negative for blurred vision, double vision and photophobia.  Respiratory: Positive for cough, sputum production and shortness of breath. Negative for hemoptysis and wheezing.        Greenish phlegm  Cardiovascular: Positive for chest pain and palpitations. Negative for orthopnea and  leg swelling.       Chest pain from coughing  Gastrointestinal: Negative for heartburn, nausea, vomiting, abdominal pain, diarrhea, constipation and melena.  Genitourinary: Negative for dysuria, urgency, frequency and hematuria.  Musculoskeletal: Negative for myalgias, back pain and neck pain.  Skin: Negative for rash.  Neurological: Positive for weakness. Negative for dizziness, tingling, tremors, sensory change, speech change, focal weakness and headaches.  Endo/Heme/Allergies: Does not bruise/bleed easily.  Psychiatric/Behavioral: Negative for depression. The patient has insomnia.      MEDICATIONS AT HOME:   Prior to Admission medications   Not on File      VITAL SIGNS:  Blood pressure 101/53, pulse 96, temperature 98.1 F (36.7 C), temperature source Oral, resp. rate 18, height 5\' 1"  (1.549 m), weight 94.802 kg (209 lb), SpO2 100 %.  PHYSICAL EXAMINATION:   Physical Exam  GENERAL:  81 y.o.-year-old obese patient lying in the bed with no acute distress.  EYES: Pupils equal, round, reactive to light and accommodation. No scleral icterus. Extraocular muscles intact.  HEENT: Head atraumatic, normocephalic. Oropharynx and nasopharynx clear.  NECK:  Supple, no jugular venous distention. No thyroid enlargement, no tenderness.  LUNGS: No wheezing heard, no crackles decreased breath sounds especially left base and coarse breath sounds and rhonchi on the left side and scattered on the right side noted. No use of accessory muscles for breathing. CARDIOVASCULAR: S1, S2 normal. No murmurs, rubs, or gallops.  ABDOMEN: Soft, nontender, nondistended. Bowel sounds present. No organomegaly or mass.  EXTREMITIES: No cyanosis, or clubbing. Bilateral lower extremity edema which seems to be chronic from venous insufficiency. NEUROLOGIC: Cranial nerves II through XII are intact. Muscle strength 5/5 in all extremities. Sensation intact. Gait not checked.  PSYCHIATRIC: The patient is alert and oriented x 3.  SKIN: No obvious rash, lesion, or ulcer.   LABORATORY PANEL:   CBC  Recent Labs Lab 08/02/15 0947  WBC 15.1*  HGB 10.7*  HCT 32.9*  PLT 278   ------------------------------------------------------------------------------------------------------------------  Chemistries   Recent Labs Lab 08/02/15 0947  NA 138  K 4.0  CL 93*  CO2 35*  GLUCOSE 115*  BUN 14  CREATININE 4.63*  CALCIUM 8.0*  AST 21  ALT 9*  ALKPHOS 192*  BILITOT 1.0   ------------------------------------------------------------------------------------------------------------------  Cardiac  Enzymes  Recent Labs Lab 08/02/15 0947  TROPONINI 0.03   ------------------------------------------------------------------------------------------------------------------  RADIOLOGY:  Dg Chest Portable 1 View  08/02/2015   CLINICAL DATA:  Cough.  Vomiting for 2 weeks.  Weakness.  EXAM: PORTABLE CHEST - 1 VIEW  COMPARISON:  07/24/2015 CT.  FINDINGS: Cardiopericardial silhouette is upper limits of normal for projection. RIGHT-greater-than-LEFT pleural effusion with bilateral RIGHT-greater-than-LEFT collapse/consolidation at the bases. Comparing to the prior CT, there is little if any interval change. Aortic arch atherosclerosis.  IMPRESSION: Unchanged bilateral RIGHT-greater-than- LEFT pleural effusions and collapse/consolidation.   Electronically Signed   By: Dereck Ligas M.D.   On: 08/02/2015 10:13    EKG:   Orders placed or performed during the hospital encounter of 08/02/15  . ED EKG  . ED EKG    IMPRESSION AND PLAN:   [name]  is a 81 y.o. female with a known history of end-stage renal disease on Monday Wednesday Friday dialysis, hypertension, atrial fibrillation not on anticoagulation, insulin-dependent diabetes mellitus, arthritis presents from home secondary to worsening cough and difficulty breathing.  #1 sepsis-secondary to pneumonia/bronchitis. -Recent CT of the chest showing bilateral infiltrates in the bases, chest x-ray this admission showing about the same changes. She has  left pleural effusion and infiltrate possibly in the lower lobes worse on the left side. -Pulmonary consult for recurrent pneumonia -Start on Levaquin, DuoNeb's when necessary ordered. After blood cultures and sputum cultures have been ordered -Oxygen support as needed. Patient does not use home oxygen.  #2 atrial fibrillation with rapid ventricular response-known history of A. fib with pulmonary hypertension -Secondary to sleep apnea. -Continue CPAP, patient on propranolol. She is also on  amiodarone. Use Cardizem if needed. -Monitor on telemetry floor -Follows with Dr. Nehemiah Massed. Seems like she has elevated chads score but not on any anticoagulation. Discussed with patient's primary care physician and also cardiologist.  #3 end-stage renal disease on hemodialysis-on Monday Wednesday Friday dialysis. -Last dialysis was yesterday per schedule. Next dialysis on Monday. -Nephrology has been consulted.  #4 hypertension-continue home medications.  #5 insulin-dependent diabetes mellitus-continue home medications.  #6 DVT prophylaxis-on subcutaneous heparin.    All the records are reviewed and case discussed with ED provider. Management plans discussed with the patient, family and they are in agreement.  CODE STATUS: Full code  TOTAL TIME TAKING CARE OF THIS PATIENT: 55 minutes.    Tymeir Weathington M.D on 08/02/2015 at 11:22 AM  Between 7am to 6pm - Pager - [phone]  After 6pm go to [email or web address] - password [email or web address] [facility]  [facility]  Office  [phone]  CC: Primary care physician; Rusty Aus., MD

## 2015-08-02 NOTE — Progress Notes (Signed)
ANTIBIOTIC CONSULT NOTE - INITIAL  Pharmacy Consult for Levofloxacin Indication: rule out pneumonia  Allergies  Allergen Reactions  . Angiotensin Receptor Blockers     Other reaction(s): Other (See Comments) hyperkalemia  . Penicillins Rash    Patient Measurements: Height: 5\' 1"  (154.9 cm) Weight: 204 lb (92.534 kg) IBW/kg (Calculated) : 47.8   Vital Signs: Temp: 98 F (36.7 C) (08/20 1342) Temp Source: Oral (08/20 1342) BP: 100/43 mmHg (08/20 1342) Pulse Rate: 49 (08/20 1342) Intake/Output from previous day:   Intake/Output from this shift:    Labs:  Recent Labs  08/02/15 0947  WBC 15.1*  HGB 10.7*  PLT 278  CREATININE 4.63*   Estimated Creatinine Clearance: 10.2 mL/min (by C-G formula based on Cr of 4.63). No results for input(s): VANCOTROUGH, VANCOPEAK, VANCORANDOM, GENTTROUGH, GENTPEAK, GENTRANDOM, TOBRATROUGH, TOBRAPEAK, TOBRARND, AMIKACINPEAK, AMIKACINTROU, AMIKACIN in the last 72 hours.   Microbiology: No results found for this or any previous visit (from the past 720 hour(s)).  Medical History: Past Medical History  Diagnosis Date  . Skin cancer     Resected from legs  . Renal insufficiency     Patient is on dialysis and normal days are M,W and F.  . Diabetes mellitus without complication     Patient takes Insulin  . ESRD (end stage renal disease)     Monday, wednesday, Friday DIalysis  . HTN (hypertension)   . Essential tremor   . OSA (obstructive sleep apnea)   . A-fib     not on anticoagulation  . Bilateral lower extremity edema   . Osteoarthritis     Medications:  Scheduled:  . heparin  5,000 Units Subcutaneous 3 times per day  . ipratropium-albuterol  3 mL Nebulization Q4H  . [START ON 08/04/2015] levofloxacin  500 mg Oral Q48H   Infusions:   Assessment:  Pharmacy consulted to dose levofloxacin for 81 yo female being treated for possible CAP. Patient started on levofloxacin 750mg  IV and ceftriaxone 1g IV Q24hr in ED.    Plan:    Patient ordered levofloxacin 500mg  PO Daily. Will transition patient to levofloxacin 500mg  PO Q48hr. Will schedule next dose on 8/22.    Expected duration 7 days with resolution of temperature and/or normalization of WBC   Pharmacy will continue to monitor and adjust per consult.   Channelle Bottger L 08/02/2015,2:09 PM

## 2015-08-02 NOTE — Progress Notes (Signed)
Blood pressure checked again at end of shift, 82/50 manually. While in room checking blood pressure, notified heart rate was in the 140's, sustaining around 135. After leaving the room heart rate is now sustaining in the low 150's. Prime MD, Sparks paged. MD stated to give a 500cc bolus and give normal saline continuous at 100/hr. Will start now and continue to monitor.

## 2015-08-02 NOTE — Progress Notes (Signed)
Notified MD that patient's BP is running low, MD stated that if systolic goes below 90 to give 250cc bolus. Will continue to monitor. Patient stated she was "feeling off". RN Denny Peon was downstairs getting lunch, RN Juliann Pulse checked blood sugar which resulted at 59. I came back upstairs to check on the patient and gave her some ice cream. It is difficult to get the patient to eat regular food due to her chronic nausea. Got MD to order zofran as needed. Will recheck blood sugar and follow up.

## 2015-08-02 NOTE — ED Notes (Addendum)
Pt reports vomiting for past 2 weeks. Pt reports feeling weak. Denies diarrhea or fever. Denies abd pain. State she has had a cough that sometime causes her to vomit. Vomited once this am.

## 2015-08-02 NOTE — Progress Notes (Signed)
Notified physician of elevated heart rate, orders placed by Dr. Doy Hutching

## 2015-08-02 NOTE — Progress Notes (Signed)
Patient admitted today from home with husband. Has had a cough since almost March after breaking her femur from a fall. High fall risk, instructed to call for assistance. Chest CT/xray positive for pneumonia. Physical therapy consult is in and patient and husband state she has been too weak to walk much and are okay with acute rehab if needed. Needing urine sample, patient has not urinated due to ESRD, but patient states she does void some, will await for sample. Blood pressure has been low, given 250cc bolus with some improvement. Will continue to monitor. CBG was low, now stable. On 2L of oxygen acute currently. Afib on tele as well as some accelerated junctional rhythm at times.

## 2015-08-03 ENCOUNTER — Inpatient Hospital Stay: Payer: Medicare Other

## 2015-08-03 DIAGNOSIS — I4891 Unspecified atrial fibrillation: Secondary | ICD-10-CM

## 2015-08-03 DIAGNOSIS — J9600 Acute respiratory failure, unspecified whether with hypoxia or hypercapnia: Secondary | ICD-10-CM

## 2015-08-03 DIAGNOSIS — J90 Pleural effusion, not elsewhere classified: Secondary | ICD-10-CM

## 2015-08-03 DIAGNOSIS — R059 Cough, unspecified: Secondary | ICD-10-CM

## 2015-08-03 DIAGNOSIS — R05 Cough: Secondary | ICD-10-CM

## 2015-08-03 LAB — GLUCOSE, CAPILLARY
GLUCOSE-CAPILLARY: 160 mg/dL — AB (ref 65–99)
GLUCOSE-CAPILLARY: 163 mg/dL — AB (ref 65–99)
GLUCOSE-CAPILLARY: 219 mg/dL — AB (ref 65–99)
Glucose-Capillary: 182 mg/dL — ABNORMAL HIGH (ref 65–99)
Glucose-Capillary: 185 mg/dL — ABNORMAL HIGH (ref 65–99)

## 2015-08-03 LAB — BASIC METABOLIC PANEL
Anion gap: 12 (ref 5–15)
BUN: 20 mg/dL (ref 6–20)
CALCIUM: 7.5 mg/dL — AB (ref 8.9–10.3)
CO2: 27 mmol/L (ref 22–32)
CREATININE: 5.15 mg/dL — AB (ref 0.44–1.00)
Chloride: 95 mmol/L — ABNORMAL LOW (ref 101–111)
GFR calc Af Amer: 8 mL/min — ABNORMAL LOW (ref >60)
GFR, EST NON AFRICAN AMERICAN: 7 mL/min — AB (ref >60)
GLUCOSE: 165 mg/dL — AB (ref 65–99)
Potassium: 4.8 mmol/L (ref 3.5–5.1)
Sodium: 134 mmol/L — ABNORMAL LOW (ref 135–145)

## 2015-08-03 LAB — URINALYSIS COMPLETE WITH MICROSCOPIC (ARMC ONLY)
Bilirubin Urine: NEGATIVE
GLUCOSE, UA: NEGATIVE mg/dL
Ketones, ur: NEGATIVE mg/dL
Nitrite: NEGATIVE
PROTEIN: 100 mg/dL — AB
Specific Gravity, Urine: 1.015 (ref 1.005–1.030)
pH: 7 (ref 5.0–8.0)

## 2015-08-03 LAB — CBC
HCT: 35.2 % (ref 35.0–47.0)
Hemoglobin: 11.3 g/dL — ABNORMAL LOW (ref 12.0–16.0)
MCH: 34.2 pg — AB (ref 26.0–34.0)
MCHC: 32.1 g/dL (ref 32.0–36.0)
MCV: 106.8 fL — ABNORMAL HIGH (ref 80.0–100.0)
Platelets: 189 10*3/uL (ref 150–440)
RBC: 3.29 MIL/uL — ABNORMAL LOW (ref 3.80–5.20)
RDW: 16 % — AB (ref 11.5–14.5)
WBC: 20.8 10*3/uL — ABNORMAL HIGH (ref 3.6–11.0)

## 2015-08-03 MED ORDER — DILTIAZEM HCL 25 MG/5ML IV SOLN
15.0000 mg | Freq: Once | INTRAVENOUS | Status: AC
  Administered 2015-08-03: 15 mg via INTRAVENOUS

## 2015-08-03 MED ORDER — NOREPINEPHRINE 4 MG/250ML-% IV SOLN
0.0000 ug/min | INTRAVENOUS | Status: DC
  Administered 2015-08-03: 5 ug/min via INTRAVENOUS
  Filled 2015-08-03: qty 250

## 2015-08-03 MED ORDER — CHLORHEXIDINE GLUCONATE CLOTH 2 % EX PADS
6.0000 | MEDICATED_PAD | Freq: Every day | CUTANEOUS | Status: AC
  Administered 2015-08-03 – 2015-08-07 (×4): 6 via TOPICAL

## 2015-08-03 MED ORDER — DM-GUAIFENESIN ER 30-600 MG PO TB12
1.0000 | ORAL_TABLET | Freq: Two times a day (BID) | ORAL | Status: DC
  Filled 2015-08-03 (×2): qty 1

## 2015-08-03 MED ORDER — MUPIROCIN 2 % EX OINT
1.0000 "application " | TOPICAL_OINTMENT | Freq: Two times a day (BID) | CUTANEOUS | Status: AC
  Administered 2015-08-03 – 2015-08-07 (×9): 1 via NASAL
  Filled 2015-08-03 (×2): qty 22

## 2015-08-03 MED ORDER — VANCOMYCIN HCL IN DEXTROSE 750-5 MG/150ML-% IV SOLN
750.0000 mg | INTRAVENOUS | Status: DC
  Administered 2015-08-04: 750 mg via INTRAVENOUS
  Filled 2015-08-03 (×2): qty 150

## 2015-08-03 MED ORDER — DEXTROMETHORPHAN POLISTIREX ER 30 MG/5ML PO SUER
30.0000 mg | Freq: Two times a day (BID) | ORAL | Status: DC
  Administered 2015-08-03 – 2015-08-11 (×17): 30 mg via ORAL
  Filled 2015-08-03 (×36): qty 5

## 2015-08-03 MED ORDER — SODIUM CHLORIDE 0.9 % IV SOLN
1500.0000 mg | Freq: Once | INTRAVENOUS | Status: AC
  Administered 2015-08-03: 1500 mg via INTRAVENOUS
  Filled 2015-08-03: qty 1500

## 2015-08-03 MED ORDER — NOREPINEPHRINE BITARTRATE 1 MG/ML IV SOLN: 0.0000 ug/min | INTRAVENOUS | Status: DC

## 2015-08-03 MED ORDER — GUAIFENESIN ER 600 MG PO TB12
600.0000 mg | ORAL_TABLET | Freq: Two times a day (BID) | ORAL | Status: DC
  Administered 2015-08-03 – 2015-08-11 (×17): 600 mg via ORAL
  Filled 2015-08-03 (×17): qty 1

## 2015-08-03 MED ORDER — CLINDAMYCIN PHOSPHATE 600 MG/50ML IV SOLN
600.0000 mg | Freq: Three times a day (TID) | INTRAVENOUS | Status: DC
  Administered 2015-08-03 – 2015-08-11 (×23): 600 mg via INTRAVENOUS
  Filled 2015-08-03 (×36): qty 50

## 2015-08-03 MED ORDER — PROPRANOLOL HCL 40 MG PO TABS
80.0000 mg | ORAL_TABLET | Freq: Two times a day (BID) | ORAL | Status: DC
  Administered 2015-08-03 – 2015-08-04 (×3): 80 mg via ORAL
  Filled 2015-08-03: qty 1
  Filled 2015-08-03 (×3): qty 2
  Filled 2015-08-03: qty 1

## 2015-08-03 MED ORDER — AMIODARONE HCL 200 MG PO TABS
100.0000 mg | ORAL_TABLET | Freq: Every day | ORAL | Status: DC
  Administered 2015-08-03 – 2015-08-09 (×7): 100 mg via ORAL
  Filled 2015-08-03 (×7): qty 1

## 2015-08-03 MED ORDER — DILTIAZEM HCL 25 MG/5ML IV SOLN
INTRAVENOUS | Status: AC
  Filled 2015-08-03: qty 5

## 2015-08-03 MED ORDER — METOPROLOL TARTRATE 1 MG/ML IV SOLN
5.0000 mg | Freq: Once | INTRAVENOUS | Status: AC
  Administered 2015-08-03: 5 mg via INTRAVENOUS
  Filled 2015-08-03: qty 5

## 2015-08-03 NOTE — Progress Notes (Signed)
Dr. Saralyn Pilar contacted in reference to HR jumping to 143 sustained.  Pt became slightly symptomatic, also noticeable decrease in O2 saturation.  Placed on 2L n/c for comfort.  Order obtained for 1 time dose of Cardizem 15mg  IV push.  Same given to pt.  Immediate result obtained, cutting HR in half.  Pt began to feel better.  Did state that it had been 2 days since she last voided and felt like she needed to go.  Pt was fine with foley being placed.  Foley was placed with immediate return of urine.  Same sent down for urinalysis.  Pt states that she was feeling better after foley insertion.  Recommend to leave pt in the unit for at least one more night for continued monitored.

## 2015-08-03 NOTE — Progress Notes (Signed)
ANTIBIOTIC CONSULT NOTE - INITIAL  Pharmacy Consult for Vancomycin Indication: pneumonia  Allergies  Allergen Reactions  . Angiotensin Receptor Blockers     Other reaction(s): Other (See Comments) hyperkalemia  . Penicillins Rash    Patient Measurements: Height: 5\' 1"  (154.9 cm) Weight: 213 lb 6.5 oz (96.8 kg) IBW/kg (Calculated) : 47.8 Adjusted Body Weight: 67.4 kg  Vital Signs: Temp: 97.6 F (36.4 C) (08/21 0700) Temp Source: Oral (08/21 0700) BP: 101/65 mmHg (08/21 1300) Pulse Rate: 116 (08/21 1300) Intake/Output from previous day: 08/20 0701 - 08/21 0700 In: 1490.6 [P.O.:240; I.V.:850.6; IV Piggyback:400] Out: 0  Intake/Output from this shift:    Labs:  Recent Labs  08/02/15 0947 08/03/15 0621  WBC 15.1* 20.8*  HGB 10.7* 11.3*  PLT 278 189  CREATININE 4.63* 5.15*   Estimated Creatinine Clearance: 9.4 mL/min (by C-G formula based on Cr of 5.15). No results for input(s): VANCOTROUGH, VANCOPEAK, VANCORANDOM, GENTTROUGH, GENTPEAK, GENTRANDOM, TOBRATROUGH, TOBRAPEAK, TOBRARND, AMIKACINPEAK, AMIKACINTROU, AMIKACIN in the last 72 hours.   Microbiology: Recent Results (from the past 720 hour(s))  Blood culture (routine x 2)     Status: None (Preliminary result)   Collection Time: 08/02/15 11:59 AM  Result Value Ref Range Status   Specimen Description BLOOD LEFT ASSIST CONTROL  Final   Special Requests   Final    BOTTLES DRAWN AEROBIC AND ANAEROBIC  AER 6CC ANA 1CC   Culture NO GROWTH < 24 HOURS  Final   Report Status PENDING  Incomplete  Blood culture (routine x 2)     Status: None (Preliminary result)   Collection Time: 08/02/15 12:00 PM  Result Value Ref Range Status   Specimen Description BLOOD LEFT FOREARM  Final   Special Requests BOTTLES DRAWN AEROBIC AND ANAEROBIC  1CC  Final   Culture NO GROWTH < 24 HOURS  Final   Report Status PENDING  Incomplete  Culture, expectorated sputum-assessment     Status: None (Preliminary result)   Collection Time:  08/02/15  8:37 PM  Result Value Ref Range Status   Specimen Description SPUTUM  Final   Special Requests Normal  Final   Sputum evaluation THIS SPECIMEN IS ACCEPTABLE FOR SPUTUM CULTURE  Final   Report Status PENDING  Incomplete  MRSA PCR Screening     Status: Abnormal   Collection Time: 08/02/15  8:37 PM  Result Value Ref Range Status   MRSA by PCR POSITIVE (A) NEGATIVE Final    Comment:        The GeneXpert MRSA Assay (FDA approved for NASAL specimens only), is one component of a comprehensive MRSA colonization surveillance program. It is not intended to diagnose MRSA infection nor to guide or monitor treatment for MRSA infections. CRITICAL RESULT CALLED TO, READ BACK BY AND VERIFIED WITH: MARCEL TURNER AT 2214 08/02/15.PMH   Culture, respiratory (NON-Expectorated)     Status: None (Preliminary result)   Collection Time: 08/02/15  8:37 PM  Result Value Ref Range Status   Specimen Description SPUTUM  Final   Special Requests Normal Reflexed from [id number]  Final   Gram Stain   Final    FAIR SPECIMEN - 70-80% WBCS FEW WBC SEEN MANY GRAM POSITIVE COCCI    Culture TOO YOUNG TO READ  Final   Report Status PENDING  Incomplete    Medical History: Past Medical History  Diagnosis Date  . Skin cancer     Resected from legs  . Renal insufficiency     Patient is on dialysis and normal days  are M,W and F.  . Diabetes mellitus without complication     Patient takes Insulin  . ESRD (end stage renal disease)     Monday, wednesday, Friday DIalysis  . HTN (hypertension)   . Essential tremor   . OSA (obstructive sleep apnea)   . A-fib     not on anticoagulation  . Bilateral lower extremity edema   . Osteoarthritis     Medications:  Anti-infectives    Start     Dose/Rate Route Frequency Ordered Stop   08/04/15 1000  levofloxacin (LEVAQUIN) tablet 500 mg     500 mg Oral Every 48 hours 08/02/15 1408     08/03/15 1400  clindamycin (CLEOCIN) IVPB 600 mg     600 mg 100 mL/hr over  30 Minutes Intravenous 3 times per day 08/03/15 1217     08/03/15 1300  vancomycin (VANCOCIN) 1,500 mg in sodium chloride 0.9 % 500 mL IVPB     1,500 mg 250 mL/hr over 120 Minutes Intravenous  Once 08/03/15 1224     08/02/15 1345  cefTRIAXone (ROCEPHIN) 1 g in dextrose 5 % 50 mL IVPB  Status:  Discontinued     1 g 100 mL/hr over 30 Minutes Intravenous Every 24 hours 08/02/15 1344 08/02/15 1410   08/02/15 1345  levofloxacin (LEVAQUIN) tablet 500 mg  Status:  Discontinued     500 mg Oral Daily 08/02/15 1344 08/02/15 1408   08/02/15 1045  levofloxacin (LEVAQUIN) IVPB 750 mg     750 mg 100 mL/hr over 90 Minutes Intravenous  Once 08/02/15 1038 08/02/15 1332     Assessment: Patient currently ordered Levaquin. Clinical condition declining and WBC rising, MD ordered Zosyn and Vancomycin per pharmacy to broaden coverage. Notified J.Singh, MD of penicilin allergy, orders received to change Zosyn to Clindamycin.  Goal of Therapy:  Vancomycin trough level 15-20 mcg/ml Resolutiion of symptoms  Plan:  Measure antibiotic drug levels at steady state Follow up culture results  Will order Vancomycin 1500mg  IV once then 750mg  IV at the end of each dialysis session. Will check a trough level prior 3rd dialysis session when schedule is known. Will order Clindamycin 600mg  IV q8h.  Paulina Fusi, PharmD, BCPS 08/03/2015 2:05 PM

## 2015-08-03 NOTE — Progress Notes (Signed)
Notified Dr.willis, unable to reach Dr.Singh paged multiple times since 2330, patient is hypotensive and is receiving iv fluids at 100cc/hour. Heart rate is 120-130's. Pt is currently asymptomatic. Received orders to transfer to step down.

## 2015-08-03 NOTE — Progress Notes (Signed)
Pt transferred from 2A for hypotension. Pt transferred via bed by orderly and RN. Bedside report given by Suzzanne Cloud. Pt alert and oriented. Pt hypotensive and tachycardic, will call MD for orders. Will continue to monitor.

## 2015-08-03 NOTE — Progress Notes (Signed)
Paged Dr.Singh x3 no response, pt is hypotensive and MRSA positive

## 2015-08-03 NOTE — Assessment & Plan Note (Signed)
-  Suspected healthcare associated pneumonia. Continue antibiotics including Levaquin and vancomycin, which appears to be appropriate -Await results of sputum cultures.Marland Kitchen

## 2015-08-03 NOTE — Assessment & Plan Note (Signed)
-  Secondary to pneumonia and pleural effusion.

## 2015-08-03 NOTE — Progress Notes (Signed)
Dr.Jasmine Candiss Norse gave orders to transfer to CCU and place on vasopressors.

## 2015-08-03 NOTE — Progress Notes (Signed)
Pt is being transferred to CCU per Dr.Singh orders due to hypotension and elevated heart rate. Pt will receive step down care. Pt is currently asymptomatic. Performed hand off with Monique. Pt and spouse is in agreement with plan of care.

## 2015-08-03 NOTE — Consult Note (Signed)
[facility] Cardiology  CARDIOLOGY CONSULT NOTE  Patient ID: [name] MRN: [id number] DOB/AGE: August 11, 1934 81 y.o.  Admit date: 08/02/2015 Referring Physician Glendon Axe M.D. Primary Physician Emily Filbert M.D. Primary Cardiologist Serafina Royals M.D. Reason for Consultation atrial fibrillation  HPI: The patient is a 81 year old female with known chronic atrial fibrillation, end-stage renal disease, insulin-dependent diabetes, admitted with pneumonia and atrial fibrillation with a rapid ventricular rate. The patient apparently was recently admitted 3/16 following femoral fracture, requiring surgery, followed by pneumonia, treated with outpatient anabiotic's. Patient apparently was in usual state of health until one to 2 weeks ago when she started to experience an increasing productive cough and presents to [facility] emergency room with elevated white count, symptoms consistent with pneumonia, acute bronchitis, atrial fibrillation with a rapid ventricular rate. The patient denies chest pain, experiences mild palpitations when her heart rate is elevated.  Review of systems complete and found to be negative unless listed above     Past Medical History  Diagnosis Date  . Skin cancer     Resected from legs  . Renal insufficiency     Patient is on dialysis and normal days are M,W and F.  . Diabetes mellitus without complication     Patient takes Insulin  . ESRD (end stage renal disease)     Monday, wednesday, Friday DIalysis  . HTN (hypertension)   . Essential tremor   . OSA (obstructive sleep apnea)   . A-fib     not on anticoagulation  . Bilateral lower extremity edema   . Osteoarthritis     Past Surgical History  Procedure Laterality Date  . Abdominal hysterectomy    . Cholecystectomy    . Femur fracture surgery      left femur  . Tubal ligation    . Ankle fracture surgery      right ankle  . Neuroplasty / transposition median nerve at carpal tunnel bilateral    . Knee  arthroplasty      Prescriptions prior to admission  Medication Sig Dispense Refill Last Dose  . allopurinol (ZYLOPRIM) 100 MG tablet Take 50 mg by mouth daily.   08/02/2015 at Unknown time  . Cholecalciferol (VITAMIN D3) 1000 UNITS CAPS Take 1,000 Units by mouth daily.   08/02/2015 at Unknown time  . fluticasone (FLONASE) 50 MCG/ACT nasal spray Place 2 sprays into both nostrils at bedtime.   08/01/2015 at Unknown time  . HYDROcodone-acetaminophen (NORCO/VICODIN) 5-325 MG per tablet Take 1 tablet by mouth every 6 (six) hours as needed. For pain.   Past Month at Unknown time  . insulin aspart protamine- aspart (NOVOLOG MIX 70/30) (70-30) 100 UNIT/ML injection Inject 15-40 Units into the skin 2 (two) times daily with a meal. Depending on blood sugar.   08/02/2015 at Unknown time  . meclizine (ANTIVERT) 25 MG tablet Take 25 mg by mouth 3 (three) times daily as needed. For dizziness.   Past Month at Unknown time  . metoCLOPramide (REGLAN) 5 MG tablet Take 5 mg by mouth 2 (two) times daily as needed. For nausea and vomiting.   08/01/2015 at Unknown time  . mirtazapine (REMERON) 7.5 MG tablet Take 7.5 mg by mouth at bedtime.   08/01/2015 at Unknown time  . Multiple Vitamin (DAILY VITE PO) Take 1 tablet by mouth daily.   08/02/2015 at Unknown time  . pantoprazole (PROTONIX) 40 MG tablet Take 40 mg by mouth daily.   unknown  . promethazine (PHENERGAN) 25 MG tablet Take 25 mg by  mouth 2 (two) times daily as needed. For nausea/vomiting.   08/01/2015 at Unknown time  . propranolol ER (INDERAL LA) 80 MG 24 hr capsule Take 80 mg by mouth 2 (two) times daily.   08/02/2015 at Unknown time  . ranitidine (ZANTAC) 300 MG tablet Take 300 mg by mouth daily.   08/01/2015 at Unknown time  . RENVELA 800 MG tablet Take 1,600 mg by mouth 3 (three) times daily with meals. And with snacks.   08/02/2015 at Unknown time  . SENSIPAR 30 MG tablet Take 30 mg by mouth daily.   unknown  . tiZANidine (ZANAFLEX) 4 MG tablet Take 4 mg by mouth 2  (two) times daily.   08/02/2015 at Unknown time  . traMADol (ULTRAM) 50 MG tablet Take 50 mg by mouth every 8 (eight) hours as needed. For pain.   [email or web address]   Social History   Social History  . Marital Status: Married    Spouse Name: N/A  . Number of Children: N/A  . Years of Education: N/A   Occupational History  . Not on file.   Social History Main Topics  . Smoking status: Never Smoker   . Smokeless tobacco: Never Used  . Alcohol Use: No  . Drug Use: No  . Sexual Activity: Not on file   Other Topics Concern  . Not on file   Social History Narrative    Family History  Problem Relation Age of Onset  . Diabetes type II Father       Review of systems complete and found to be negative unless listed above      PHYSICAL EXAM  General: Well developed, well nourished, in no acute distress HEENT:  Normocephalic and atramatic Neck:  No JVD.  Lungs: Decreased breath sounds bilaterally Heart: Irregularly irregular rhythm. Normal S1 and S2 without gallops or murmurs.  Abdomen: Bowel sounds are positive, abdomen soft and non-tender  Msk:  Back normal, normal gait. Normal strength and tone for age. Extremities: No clubbing, cyanosis or edema.   Neuro: Alert and oriented X 3. Psych:  Good affect, responds appropriately  Labs:   Lab Results  Component Value Date   WBC 20.8* 08/03/2015   HGB 11.3* 08/03/2015   HCT 35.2 08/03/2015   MCV 106.8* 08/03/2015   PLT 189 08/03/2015    Recent Labs Lab 08/02/15 0947 08/03/15 0621  NA 138 134*  K 4.0 4.8  CL 93* 95*  CO2 35* 27  BUN 14 20  CREATININE 4.63* 5.15*  CALCIUM 8.0* 7.5*  PROT 6.9  --   BILITOT 1.0  --   ALKPHOS 192*  --   ALT 9*  --   AST 21  --   GLUCOSE 115* 165*   Lab Results  Component Value Date   TROPONINI 0.03 08/02/2015    Lab Results  Component Value Date   CHOL 108 09/09/2012   Lab Results  Component Value Date   HDL 29* 09/09/2012   Lab Results  Component Value Date   LDLCALC 65  09/09/2012   Lab Results  Component Value Date   TRIG 71 09/09/2012   No results found for: CHOLHDL No results found for: LDLDIRECT    Radiology: Ct Chest W Contrast  07/24/2015   CLINICAL DATA:  81 year old female with cough since March 2016, worsening over the past 2 weeks, now productive of yellow and yellowish green sputum. Shortness of breath on exertion.  EXAM: CT CHEST, ABDOMEN, AND PELVIS WITH CONTRAST  TECHNIQUE: Multidetector CT  imaging of the chest, abdomen and pelvis was performed following the standard protocol during bolus administration of intravenous contrast.  CONTRAST:  11mL OMNIPAQUE IOHEXOL 300 MG/ML  SOLN  COMPARISON:  CT the abdomen and pelvis 03/04/2015. CT of the chest 09/16/2013.  FINDINGS: CT CHEST FINDINGS  Mediastinum/Lymph Nodes: Heart size is enlarged with biatrial dilatation. There is no significant pericardial fluid, thickening or pericardial calcification. Severe tooth thickening calcification of the mitral valve and mitral annulus. There is atherosclerosis of the thoracic aorta, the great vessels of the mediastinum and the coronary arteries, including calcified atherosclerotic plaque in the left main and right coronary arteries. Numerous prominent borderline enlarged mediastinal and bilateral hilar lymph nodes, likely reactive. Esophagus is unremarkable in appearance. No axillary lymphadenopathy.  Lungs/Pleura: Extensive volume loss and airspace consolidation in the lower lobes of the lungs bilaterally. Many of the lower lobe bronchi appear filled with fluid, presumably retained secretions and/or aspirated material. Small bilateral pleural effusions (right greater than left) layering dependently. There is also some patchy peribronchovascular ground-glass attenuation and micronodularity in the dependent portion of the right upper lobe. No other larger more suspicious appearing pulmonary nodules or masses are identified.  Musculoskeletal/Soft Tissues: There are no  aggressive appearing lytic or blastic lesions noted in the visualized portions of the skeleton.  CT ABDOMEN AND PELVIS FINDINGS  Hepatobiliary: The liver has a slightly shrunken appearance and mildly nodular contour, suggestive of underlying cirrhosis. No discrete cystic or solid hepatic lesion. No intra or extrahepatic biliary ductal dilatation. Gallbladder is not visualized, presumably surgically absent (no surgical clips are noted in the gallbladder fossa).  Pancreas: No pancreatic mass. No pancreatic ductal dilatation. No pancreatic or peripancreatic fluid or inflammatory changes.  Spleen: Unremarkable.  Adrenals/Urinary Tract: Multiple small calcifications are noted within the collecting systems of the kidneys bilaterally, likely a combination of vascular calcifications and nonobstructive calculi. The largest nonobstructive calculus is in the interpolar region of the left kidney measuring 3 mm. Multiple sub cm low-attenuation lesions in the kidneys bilaterally too small to definitively characterize, but are favored to represent tiny cysts. 1.9 cm low-attenuation lesion in the interpolar region of the left kidney is compatible with a simple cyst. Bilateral adrenal glands are normal in appearance. No hydroureteronephrosis. Urinary bladder is normal in appearance.  Stomach/Bowel: The appearance of the stomach is normal. No pathologic dilatation of small bowel or colon.  Vascular/Lymphatic: Atherosclerosis throughout the abdominal and pelvic vasculature, without evidence of aneurysm or dissection. No lymphadenopathy noted in the abdomen or pelvis.  Reproductive: Status post hysterectomy. Ovaries are not confidently identified may be surgically absent or atrophic.  Other: Tiny umbilical hernia containing only omental fat incidentally noted. No significant volume of ascites. No pneumoperitoneum.  Musculoskeletal: Chronic compression fracture of L4 with approximately 30% loss of anterior vertebral body height is  unchanged. Cavernous hemangioma and L1 vertebral body again incidentally noted. Old healed fractures of the left superior and inferior pubic rami are incidentally noted. There are no aggressive appearing lytic or blastic lesions noted in the visualized portions of the skeleton.  IMPRESSION: 1. Extensive volume loss and airspace consolidation in the lower lobes of the lungs bilaterally, and to a lesser extent in the posterior aspect of the right upper lobe. Given the patient's chronic symptoms, these findings are concerning for recurrent aspiration with associated aspiration pneumonia, and further evaluation by speech therapy to assess for aspiration is recommended in the near future. 2. Small bilateral pleural effusions layering dependently (right greater than left). 3. No acute  findings in the abdomen or pelvis. 4. Atherosclerosis, including left main and right coronary artery disease. Assessment for potential risk factor modification, dietary therapy or pharmacologic therapy may be warranted, if clinically indicated. 5. The appearance of the liver suggests underlying cirrhosis. 6. Cardiomegaly with biatrial dilatation. 7. There are calcifications of the mitral valve and annulus. Echocardiographic correlation for evaluation of potential valvular dysfunction may be warranted if clinically indicated. 8. Additional incidental findings, as above.   Electronically Signed   By: Vinnie Langton M.D.   On: 07/24/2015 17:29   Ct Abdomen Pelvis W Contrast  07/24/2015   CLINICAL DATA:  81 year old female with cough since March 2016, worsening over the past 2 weeks, now productive of yellow and yellowish green sputum. Shortness of breath on exertion.  EXAM: CT CHEST, ABDOMEN, AND PELVIS WITH CONTRAST  TECHNIQUE: Multidetector CT imaging of the chest, abdomen and pelvis was performed following the standard protocol during bolus administration of intravenous contrast.  CONTRAST:  196mL OMNIPAQUE IOHEXOL 300 MG/ML  SOLN   COMPARISON:  CT the abdomen and pelvis 03/04/2015. CT of the chest 09/16/2013.  FINDINGS: CT CHEST FINDINGS  Mediastinum/Lymph Nodes: Heart size is enlarged with biatrial dilatation. There is no significant pericardial fluid, thickening or pericardial calcification. Severe tooth thickening calcification of the mitral valve and mitral annulus. There is atherosclerosis of the thoracic aorta, the great vessels of the mediastinum and the coronary arteries, including calcified atherosclerotic plaque in the left main and right coronary arteries. Numerous prominent borderline enlarged mediastinal and bilateral hilar lymph nodes, likely reactive. Esophagus is unremarkable in appearance. No axillary lymphadenopathy.  Lungs/Pleura: Extensive volume loss and airspace consolidation in the lower lobes of the lungs bilaterally. Many of the lower lobe bronchi appear filled with fluid, presumably retained secretions and/or aspirated material. Small bilateral pleural effusions (right greater than left) layering dependently. There is also some patchy peribronchovascular ground-glass attenuation and micronodularity in the dependent portion of the right upper lobe. No other larger more suspicious appearing pulmonary nodules or masses are identified.  Musculoskeletal/Soft Tissues: There are no aggressive appearing lytic or blastic lesions noted in the visualized portions of the skeleton.  CT ABDOMEN AND PELVIS FINDINGS  Hepatobiliary: The liver has a slightly shrunken appearance and mildly nodular contour, suggestive of underlying cirrhosis. No discrete cystic or solid hepatic lesion. No intra or extrahepatic biliary ductal dilatation. Gallbladder is not visualized, presumably surgically absent (no surgical clips are noted in the gallbladder fossa).  Pancreas: No pancreatic mass. No pancreatic ductal dilatation. No pancreatic or peripancreatic fluid or inflammatory changes.  Spleen: Unremarkable.  Adrenals/Urinary Tract: Multiple small  calcifications are noted within the collecting systems of the kidneys bilaterally, likely a combination of vascular calcifications and nonobstructive calculi. The largest nonobstructive calculus is in the interpolar region of the left kidney measuring 3 mm. Multiple sub cm low-attenuation lesions in the kidneys bilaterally too small to definitively characterize, but are favored to represent tiny cysts. 1.9 cm low-attenuation lesion in the interpolar region of the left kidney is compatible with a simple cyst. Bilateral adrenal glands are normal in appearance. No hydroureteronephrosis. Urinary bladder is normal in appearance.  Stomach/Bowel: The appearance of the stomach is normal. No pathologic dilatation of small bowel or colon.  Vascular/Lymphatic: Atherosclerosis throughout the abdominal and pelvic vasculature, without evidence of aneurysm or dissection. No lymphadenopathy noted in the abdomen or pelvis.  Reproductive: Status post hysterectomy. Ovaries are not confidently identified may be surgically absent or atrophic.  Other: Tiny umbilical hernia containing only  omental fat incidentally noted. No significant volume of ascites. No pneumoperitoneum.  Musculoskeletal: Chronic compression fracture of L4 with approximately 30% loss of anterior vertebral body height is unchanged. Cavernous hemangioma and L1 vertebral body again incidentally noted. Old healed fractures of the left superior and inferior pubic rami are incidentally noted. There are no aggressive appearing lytic or blastic lesions noted in the visualized portions of the skeleton.  IMPRESSION: 1. Extensive volume loss and airspace consolidation in the lower lobes of the lungs bilaterally, and to a lesser extent in the posterior aspect of the right upper lobe. Given the patient's chronic symptoms, these findings are concerning for recurrent aspiration with associated aspiration pneumonia, and further evaluation by speech therapy to assess for aspiration is  recommended in the near future. 2. Small bilateral pleural effusions layering dependently (right greater than left). 3. No acute findings in the abdomen or pelvis. 4. Atherosclerosis, including left main and right coronary artery disease. Assessment for potential risk factor modification, dietary therapy or pharmacologic therapy may be warranted, if clinically indicated. 5. The appearance of the liver suggests underlying cirrhosis. 6. Cardiomegaly with biatrial dilatation. 7. There are calcifications of the mitral valve and annulus. Echocardiographic correlation for evaluation of potential valvular dysfunction may be warranted if clinically indicated. 8. Additional incidental findings, as above.   Electronically Signed   By: Vinnie Langton M.D.   On: 07/24/2015 17:29   Dg Chest Portable 1 View  08/02/2015   CLINICAL DATA:  Cough.  Vomiting for 2 weeks.  Weakness.  EXAM: PORTABLE CHEST - 1 VIEW  COMPARISON:  07/24/2015 CT.  FINDINGS: Cardiopericardial silhouette is upper limits of normal for projection. RIGHT-greater-than-LEFT pleural effusion with bilateral RIGHT-greater-than-LEFT collapse/consolidation at the bases. Comparing to the prior CT, there is little if any interval change. Aortic arch atherosclerosis.  IMPRESSION: Unchanged bilateral RIGHT-greater-than- LEFT pleural effusions and collapse/consolidation.   Electronically Signed   By: Dereck Ligas M.D.   On: 08/02/2015 10:13    EKG: Atrial fibrillation with a ventricular rate of 100 to 110 bpm  ASSESSMENT AND PLAN:   Atrial fibrillation with a rapid ventricular rate, medically stable, in the setting of recurrent pneumonia  Recommendations  1. Resume 80 mg twice a day 2. Resume amiodarone 100 mg daily 3. Consider 2-D echocardiogram if not performed in the past 6 months 4. Consider chronic anticoagulation. The patient is a chads Vasc 4. This can be discussed with Dr. Nehemiah Massed as an outpatient.  SignedIsaias Cowman MD,PhD,  [facility] 08/03/2015, 10:24 AM

## 2015-08-03 NOTE — Progress Notes (Signed)
After 5mg  Lopressor IV, pt HR still in 120s-130s. MD Candiss Norse notified. No new orders at this time. Will continue to monitor.

## 2015-08-03 NOTE — Assessment & Plan Note (Signed)
-  With fast heart rate, which is now controlled. Cardiology service is following.

## 2015-08-03 NOTE — Assessment & Plan Note (Signed)
Productive of green sputum secondary to pneumonia.

## 2015-08-03 NOTE — Consult Note (Signed)
[facility] Consultation      Assessment and Plan:  Pneumonia -Suspected healthcare associated pneumonia. Continue antibiotics including Levaquin and vancomycin, which appears to be appropriate -Await results of sputum cultures.  -The patient will need outpatient follow-up which will include pulmonary testing as well as repeat imaging to ensure resolution of the changes seen on the chest x-ray.  Pleural effusion -Cannot rule out the possibility of a parapneumonic effusion. We'll check a two-view chest x-ray and continue antibiotics. Should the effusion persist on the 2-view chest x-ray she may require a thoracentesis to rule out the presence of an empyema.  Acute respiratory failure -Secondary to pneumonia and pleural effusion.  Cough Productive of green sputum secondary to pneumonia.  Atrial fibrillation -With fast heart rate, which is now controlled. Cardiology service is following.     Date: 08/03/2015  MRN# [id number] [name] 79-Aug-1935  Referring Physician:   Ineta Sinning is a 81 y.o. old female seen in consultation for chief complaint of:    Chief Complaint  Patient presents with  . Weakness  . Emesis     HPI:   [name] is a 81 y.o. female with a known history of end-stage renal disease on Monday Wednesday Friday dialysis, hypertension, atrial fibrillation not on anticoagulation, insulin-dependent diabetes mellitus, arthritis.  The patient presented to the hospital recently due to persistent cough and shortness of breath, which began approximately 3 weeks ago. The symptoms started with mildly increasing dyspnea and a mild cough. Symptoms progressed with worsening dyspnea and increasingly productive cough which is productive of greenish to yellowish sputum. There was no exacerbating or relieving factors. Her breathing would get worse with ambulation and improved with rest. She had no dyspnea while at rest. Her recent history significant  for a femoral fracture in March of this year. Her course after that episode was compensated by an episode of pneumonia requiring antibiotics. She has no known history of pulmonary disease in the past. She is a lifelong nonsmoker. Her respiratory status was normal before this episode occurred. She does not use any inhaled medications at home. She has no history of chronic cough in the past. Her presentation to hospital was completed by development of atrial fibrillation with rapid ventricular rate, and hypotension for which she was transferred to the intensive care unit last night. Currently, her nurse status is better. Her heart rate is much better controlled. Her blood pressure is now stable. She is eating planned to be transferred to the floor later today, however, she continues to have this severe cough and dyspnea. She is no other particular complaints at this time.  PMHX:   Past Medical History  Diagnosis Date  . Skin cancer     Resected from legs  . Renal insufficiency     Patient is on dialysis and normal days are M,W and F.  . Diabetes mellitus without complication     Patient takes Insulin  . ESRD (end stage renal disease)     Monday, wednesday, Friday DIalysis  . HTN (hypertension)   . Essential tremor   . OSA (obstructive sleep apnea)   . A-fib     not on anticoagulation  . Bilateral lower extremity edema   . Osteoarthritis    Surgical Hx:  Past Surgical History  Procedure Laterality Date  . Abdominal hysterectomy    . Cholecystectomy    . Femur fracture surgery      left femur  . Tubal ligation    .  Ankle fracture surgery      right ankle  . Neuroplasty / transposition median nerve at carpal tunnel bilateral    . Knee arthroplasty     Family Hx:  Family History  Problem Relation Age of Onset  . Diabetes type II Father    Social Hx:   Social History  Substance Use Topics  . Smoking status: Never Smoker   . Smokeless tobacco: Never Used  . Alcohol Use: No    Medication:   No current outpatient prescriptions on file.    Allergies:  Angiotensin receptor blockers and Penicillins  Review of Systems: Gen:  Denies  fever, sweats, chills HEENT: Denies blurred vision, double vision. bleeds, sore throat Cvc:  No dizziness, chest pain. Resp:   Denies cough or sputum porduction, shortness of breath Gi: Denies swallowing difficulty, stomach pain. Gu:  Denies bladder incontinence, burning urine Ext:   No Joint pain, stiffness. Skin: No skin rash,  hives Endoc:  No polyuria, polydipsia. Psych: No depression, insomnia. Other:  All other systems were reviewed with the patient and were negative other that what is mentioned in the HPI.   Physical Examination:   VS: BP 101/65 mmHg  Pulse 116  Temp(Src) 97.6 F (36.4 C) (Oral)  Resp 30  Ht 5\' 1"  (1.549 m)  Wt 96.8 kg (213 lb 6.5 oz)  BMI 40.34 kg/m2  SpO2 93%  General Appearance: No distress  Neuro:without focal findings,  speech normal,  HEENT: PERRLA, EOM intact.   Pulmonary: normal breath sounds, No wheezing.  CardiovascularNormal S1,S2.  No m/r/g.   Abdomen: Benign, Soft, non-tender. Renal:  No costovertebral tenderness  GU:  No performed at this time. Endoc: No evident thyromegaly, no signs of acromegaly. Skin:   warm, no rashes, no ecchymosis  Extremities: normal, no cyanosis, clubbing.  Other findings: I reviewed the patient's most recent imaging, as well as reality reports, which included a chest x-ray from 08/02/2015 and a CT of the chest from 07/24/2015 that shows a right basal opacity in the chest x-ray and CT of the chest shows a right basal opacity with atelectasis versus pneumonia as well as a bilateral pleural effusion which appears to be larger on the right side.    LABORATORY PANEL:   CBC  Recent Labs Lab 08/03/15 0621  WBC 20.8*  HGB 11.3*  HCT 35.2  PLT 189    ------------------------------------------------------------------------------------------------------------------  Chemistries   Recent Labs Lab 08/02/15 0947 08/03/15 0621  NA 138 134*  K 4.0 4.8  CL 93* 95*  CO2 35* 27  GLUCOSE 115* 165*  BUN 14 20  CREATININE 4.63* 5.15*  CALCIUM 8.0* 7.5*  AST 21  --   ALT 9*  --   ALKPHOS 192*  --   BILITOT 1.0  --    ------------------------------------------------------------------------------------------------------------------  Cardiac Enzymes  Recent Labs Lab 08/02/15 0947  TROPONINI 0.03   ------------------------------------------------------------   Rad results:      Orders for this visit:    Thank  you for the consultation and for allowing [facility] to assist in the care of your patient. Our recommendations are noted above.  Please contact us if we can be of further service.   Marda Stalker, MD.  Board Certified in Internal Medicine, Pulmonary Medicine, [place], and Sleep Medicine.  [facility] Office Number: [phone]  Patricia Pesa, M.D.  Vilinda Boehringer, M.D.  Cheral Marker, M.D

## 2015-08-03 NOTE — Progress Notes (Signed)
Subjective: Patient was transferred to CCU overnight for rapid atrial fibrillation and hypotension. Rate was controlled after IV dose of Lopressor 5 mg. She received vasopressor support with IV norepinephrine in CCU overnight. Blood pressure improved. Norepinephrine was discontinued. Patient is awake, alert and oriented 3. Husband at bedside. Patient complains of persistent cough, though seated with small amount of expectoration and mild shortness of breath. No palpitations or chest pain.  Objective: Vital signs in last 24 hours: Temp:  [97.6 F (36.4 C)-98.3 F (36.8 C)] 97.6 F (36.4 C) (08/21 0700) Pulse Rate:  [49-161] 59 (08/21 1100) Resp:  [12-34] 25 (08/21 1100) BP: (72-145)/(40-105) 128/53 mmHg (08/21 1100) SpO2:  [64 %-100 %] 96 % (08/21 1100) Weight:  [92.534 kg (204 lb)-96.8 kg (213 lb 6.5 oz)] 96.8 kg (213 lb 6.5 oz) (08/21 0300) Weight change:  Last BM Date: 08/02/15  Intake/Output from previous day: 08/20 0701 - 08/21 0700 In: 1490.6 [P.O.:240; I.V.:850.6; IV Piggyback:400] Out: 0  Intake/Output this shift:   GENERAL:alert, oriented x 3, no acute distress.  EYES: no pallor, no icterus, extraocular muscles are intact.  HEENT: Head atraumatic, normocephalic. Oropharynx and nasopharynx clear.  NECK: supple, mild jugular venous distention. No thyroid enlargement, no tenderness.  LUNGS: Bibasilar crackles. No wheezing or rhonchi. No use of accessory muscles of respiration.  CARDIOVASCULAR: Irregularly irregular rhythm, mild tachycardia.  ABDOMEN: Soft, non-tender, normal bowel sounds, no organomegaly or mass.  EXTREMITIES: No ankle edema, cyanosis, or clubbing.  NEUROLOGIC: normal strength and tone in bilateral upper and lower extremities, sensations are grossly intact, normal speech. SKIN: No obvious rash, lesion, or ulcer.   Lab Results:  Recent Labs  08/02/15 0947 08/03/15 0621  WBC 15.1* 20.8*  HGB 10.7* 11.3*  HCT 32.9* 35.2  PLT 278 189    BMET  Recent Labs  08/02/15 0947 08/03/15 0621  NA 138 134*  K 4.0 4.8  CL 93* 95*  CO2 35* 27  GLUCOSE 115* 165*  BUN 14 20  CREATININE 4.63* 5.15*  CALCIUM 8.0* 7.5*    Studies/Results: Dg Chest Portable 1 View  08/02/2015   CLINICAL DATA:  Cough.  Vomiting for 2 weeks.  Weakness.  EXAM: PORTABLE CHEST - 1 VIEW  COMPARISON:  07/24/2015 CT.  FINDINGS: Cardiopericardial silhouette is upper limits of normal for projection. RIGHT-greater-than-LEFT pleural effusion with bilateral RIGHT-greater-than-LEFT collapse/consolidation at the bases. Comparing to the prior CT, there is little if any interval change. Aortic arch atherosclerosis.  IMPRESSION: Unchanged bilateral RIGHT-greater-than- LEFT pleural effusions and collapse/consolidation.   Electronically Signed   By: Dereck Ligas M.D.   On: 08/02/2015 10:13    Medications: Scheduled Meds: . amiodarone  100 mg Oral Daily  . Chlorhexidine Gluconate Cloth  6 each Topical Q0600  . heparin  5,000 Units Subcutaneous 3 times per day  . insulin aspart  0-5 Units Subcutaneous QHS  . insulin aspart  0-9 Units Subcutaneous TID WC  . ipratropium-albuterol  3 mL Nebulization Q4H  . [START ON 08/04/2015] levofloxacin  500 mg Oral Q48H  . mupirocin ointment  1 application Nasal BID  . propranolol  80 mg Oral BID   Continuous Infusions: . sodium chloride 100 mL/hr at 08/03/15 0400  . norepinephrine 5 mcg/min (08/03/15 0600)   PRN Meds:.acetaminophen **OR** acetaminophen, benzonatate, docusate sodium, ondansetron (ZOFRAN) IV, senna   Assessment/Plan: 81 year old lady with history of insulin-requiring diabetes, paroxysmal atrial fibrillation, moderate tricuspid insufficiency, pulmonary hypertension and obstructive sleep apnea on CPAP. Patient was admitted with worsening cough and shortness of  breath. Recent CT chest was remarkable for bilateral lower lobe consolidation and small bilateral pleural effusion. Similar findings were noted on  admission chest x-ray, concerning for aspiration pneumonia. Patient reports persistent cough since March, 2016 treated with at least 4 courses of antibiotics since March.  Bilateral lower lobe aspiration pneumonia in elderly diabetic patient with end-stage renal disease on dialysis and multiple courses of antibiotic as outpatient. Rising WBC count and clinical worsening. Presentation is concerning for resistant infection. Patient received levofloxacin on admission. Shall start Zosyn and vancomycin. Continue DuoNeb treatments and Tessalon Perles as needed for cough. Follow sputum culture and blood culture.  Atrial fibrillation with rapid ventricular response and hypotension: Responded to IV Lopressor 5 mg. Heart rate 110-117 on the monitor. Blood pressure improved. Norepinephrine was discontinued. Shall continue amiodarone 100 mg daily and propranolol 80 mg twice daily. Cardiology input appreciated. CHADS-Vasc score of 4. Long-term anticoagulation to be discussed with Dr. Nehemiah Massed.  Diabetes: Continue close blood sugar monitoring and sliding scale coverage.  Obstructive sleep apnea on CPAP.   LOS: 1 day   [name] 08/03/2015, 11:46 AM

## 2015-08-03 NOTE — Progress Notes (Signed)
Spoke with Dr. Candiss Norse, orders to give Lopressor 5mg  once, and start levophed drip to maintain MAP 65 greater.

## 2015-08-03 NOTE — Progress Notes (Signed)
[facility]  ROUNDING NOTE   Subjective:  Pt admitted with pneumonia and shortness of breath. Had HD on Friday. Pt well known to [place].     Objective:  Vital signs in last 24 hours:  Temp:  [97.6 F (36.4 C)-98.3 F (36.8 C)] 97.6 F (36.4 C) (08/21 0700) Pulse Rate:  [54-161] 77 (08/21 1400) Resp:  [12-38] 35 (08/21 1400) BP: (72-145)/(40-105) 79/45 mmHg (08/21 1400) SpO2:  [64 %-100 %] 87 % (08/21 1400) Weight:  [96.8 kg (213 lb 6.5 oz)] 96.8 kg (213 lb 6.5 oz) (08/21 0300)  Weight change:  Filed Weights   08/02/15 0858 08/02/15 1342 08/03/15 0300  Weight: 94.802 kg (209 lb) 92.534 kg (204 lb) 96.8 kg (213 lb 6.5 oz)    Intake/Output: I/O last 3 completed shifts: In: 1490.6 [P.O.:240; I.V.:850.6; IV Piggyback:400] Out: 0    Intake/Output this shift:     Physical Exam: General: NAD, resting in bed  Head: Normocephalic, atraumatic. Moist oral mucosal membranes  Eyes: Anicteric  Neck: Supple, trachea midline  Lungs:  Bibasilar rales normale effort  Heart: Regular rate and rhythm S1S2 no rubs  Abdomen:  Soft, nontender, obese, BS present  Extremities:  1+ peripheral edema.  Neurologic: Nonfocal, moving all four extremities  Skin: No lesions  Access: LUE AVF    Basic Metabolic Panel:  Recent Labs Lab 08/02/15 0947 08/03/15 0621  NA 138 134*  K 4.0 4.8  CL 93* 95*  CO2 35* 27  GLUCOSE 115* 165*  BUN 14 20  CREATININE 4.63* 5.15*  CALCIUM 8.0* 7.5*    Liver Function Tests:  Recent Labs Lab 08/02/15 0947  AST 21  ALT 9*  ALKPHOS 192*  BILITOT 1.0  PROT 6.9  ALBUMIN 2.6*    Recent Labs Lab 08/02/15 0947  LIPASE 17*   No results for input(s): AMMONIA in the last 168 hours.  CBC:  Recent Labs Lab 08/02/15 0947 08/03/15 0621  WBC 15.1* 20.8*  NEUTROABS 11.5*  --   HGB 10.7* 11.3*  HCT 32.9* 35.2  MCV 103.3* 106.8*  PLT 278 189    Cardiac Enzymes:  Recent Labs Lab 08/02/15 0947  TROPONINI 0.03    BNP: Invalid  input(s): POCBNP  CBG:  Recent Labs Lab 08/02/15 1608 08/02/15 1938 08/03/15 0249 08/03/15 0726 08/03/15 1049  GLUCAP 132* 251* 160* 163* 219*    Microbiology: Results for orders placed or performed during the hospital encounter of 08/02/15  Blood culture (routine x 2)     Status: None (Preliminary result)   Collection Time: 08/02/15 11:59 AM  Result Value Ref Range Status   Specimen Description BLOOD LEFT ASSIST CONTROL  Final   Special Requests   Final    BOTTLES DRAWN AEROBIC AND ANAEROBIC  AER 6CC ANA 1CC   Culture NO GROWTH < 24 HOURS  Final   Report Status PENDING  Incomplete  Blood culture (routine x 2)     Status: None (Preliminary result)   Collection Time: 08/02/15 12:00 PM  Result Value Ref Range Status   Specimen Description BLOOD LEFT FOREARM  Final   Special Requests BOTTLES DRAWN AEROBIC AND ANAEROBIC  1CC  Final   Culture NO GROWTH < 24 HOURS  Final   Report Status PENDING  Incomplete  Culture, expectorated sputum-assessment     Status: None (Preliminary result)   Collection Time: 08/02/15  8:37 PM  Result Value Ref Range Status   Specimen Description SPUTUM  Final   Special Requests Normal  Final  Sputum evaluation THIS SPECIMEN IS ACCEPTABLE FOR SPUTUM CULTURE  Final   Report Status PENDING  Incomplete  MRSA PCR Screening     Status: Abnormal   Collection Time: 08/02/15  8:37 PM  Result Value Ref Range Status   MRSA by PCR POSITIVE (A) NEGATIVE Final    Comment:        The GeneXpert MRSA Assay (FDA approved for NASAL specimens only), is one component of a comprehensive MRSA colonization surveillance program. It is not intended to diagnose MRSA infection nor to guide or monitor treatment for MRSA infections. CRITICAL RESULT CALLED TO, READ BACK BY AND VERIFIED WITH: MARCEL TURNER AT 2214 08/02/15.PMH   Culture, respiratory (NON-Expectorated)     Status: None (Preliminary result)   Collection Time: 08/02/15  8:37 PM  Result Value Ref Range  Status   Specimen Description SPUTUM  Final   Special Requests Normal Reflexed from [id number]  Final   Gram Stain   Final    FAIR SPECIMEN - 70-80% WBCS FEW WBC SEEN MANY GRAM POSITIVE COCCI    Culture TOO YOUNG TO READ  Final   Report Status PENDING  Incomplete    Coagulation Studies: No results for input(s): LABPROT, INR in the last 72 hours.  Urinalysis: No results for input(s): COLORURINE, LABSPEC, PHURINE, GLUCOSEU, HGBUR, BILIRUBINUR, KETONESUR, PROTEINUR, UROBILINOGEN, NITRITE, LEUKOCYTESUR in the last 72 hours.  Invalid input(s): APPERANCEUR    Imaging: Dg Chest Portable 1 View  08/02/2015   CLINICAL DATA:  Cough.  Vomiting for 2 weeks.  Weakness.  EXAM: PORTABLE CHEST - 1 VIEW  COMPARISON:  07/24/2015 CT.  FINDINGS: Cardiopericardial silhouette is upper limits of normal for projection. RIGHT-greater-than-LEFT pleural effusion with bilateral RIGHT-greater-than-LEFT collapse/consolidation at the bases. Comparing to the prior CT, there is little if any interval change. Aortic arch atherosclerosis.  IMPRESSION: Unchanged bilateral RIGHT-greater-than- LEFT pleural effusions and collapse/consolidation.   Electronically Signed   By: Dereck Ligas M.D.   On: 08/02/2015 10:13     Medications:   . sodium chloride 100 mL/hr at 08/03/15 0400  . norepinephrine 5 mcg/min (08/03/15 0600)   . amiodarone  100 mg Oral Daily  . Chlorhexidine Gluconate Cloth  6 each Topical Q0600  . clindamycin (CLEOCIN) IV  600 mg Intravenous 3 times per day  . diltiazem      . heparin  5,000 Units Subcutaneous 3 times per day  . insulin aspart  0-5 Units Subcutaneous QHS  . insulin aspart  0-9 Units Subcutaneous TID WC  . ipratropium-albuterol  3 mL Nebulization Q4H  . [START ON 08/04/2015] levofloxacin  500 mg Oral Q48H  . mupirocin ointment  1 application Nasal BID  . propranolol  80 mg Oral BID  . vancomycin  1,500 mg Intravenous Once   acetaminophen **OR** acetaminophen, benzonatate, docusate  sodium, ondansetron (ZOFRAN) IV, senna  Assessment/ Plan:  81 y.o. female Diabetes mellitus type 2 x 20 years, ESRD ON HD, HTN ,Psoriasis, Sleep apnea, CPAP, Recurrent vertigo, Essential tremor, Bilateral knee replacement, ACD, Secondary hyperparathyroidism, Cholcystecomy, Hysterectomy, Bilateral Tubal ligation, Carpal tunnel release, left radiocephalic AVF 0/1/75, admission for chest pain at [facility] 9/13, admission to [facility] for Afib 10/14, on chronic anticoagulation, left hip fracture, left ureteral stones, admission for pneumonia 07/2015.  1.  ESRD on HD MWF:  No urgent indication for HD at the moment, will plan for HD again tomorrow.  Will decrease IVF rate down to 50cc/hr.  2.  Hypotension:  Remains on noreipnephrine, await culture data, continue levofloxacin and  vancomycin for now.  Decrease IVF rate as above given HD status.  3.  Anemia of CKD:  hgb 11.3, hold epogen for now.  4.  SHPTH:  Check ipth/phos with next HD treatment.   LOS: 1 Tayna Smethurst 8/21/20162:52 PM

## 2015-08-03 NOTE — Plan of Care (Signed)
Problem: Phase I Progression Outcomes Goal: Hemodynamically stable Outcome: Not Progressing Pt is hypotensive

## 2015-08-03 NOTE — Progress Notes (Signed)
PT Cancellation Note  Patient Details Name: [name] MRN: [id number] DOB: [birth year]   Cancelled Treatment:    Reason Eval/Treat Not Completed: Patient not medically ready Pt's resting HR in the 140s, nursing asks that we hold PT for today.  Will try back when pt is medically appropriate.   Kreg Shropshire 08/03/2015, 3:28 PM

## 2015-08-03 NOTE — Assessment & Plan Note (Signed)
-  Cannot rule out the possibility of a parapneumonic effusion. We'll check a two-view chest x-ray and continue antibiotics. Should the effusion persist me to view chest x-ray. Patient may require a thoracentesis to rule out the presence of an empyema.

## 2015-08-03 NOTE — Progress Notes (Signed)
Notiifed Dr. Candiss Norse of elevated heart rate and cough, new orders received.

## 2015-08-04 ENCOUNTER — Inpatient Hospital Stay: Payer: Medicare Other

## 2015-08-04 DIAGNOSIS — I481 Persistent atrial fibrillation: Secondary | ICD-10-CM

## 2015-08-04 DIAGNOSIS — J189 Pneumonia, unspecified organism: Secondary | ICD-10-CM

## 2015-08-04 DIAGNOSIS — J90 Pleural effusion, not elsewhere classified: Secondary | ICD-10-CM

## 2015-08-04 DIAGNOSIS — R05 Cough: Secondary | ICD-10-CM

## 2015-08-04 DIAGNOSIS — J9601 Acute respiratory failure with hypoxia: Secondary | ICD-10-CM

## 2015-08-04 LAB — BODY FLUID CELL COUNT WITH DIFFERENTIAL
EOS FL: 0 %
Lymphs, Fluid: 18 %
MONOCYTE-MACROPHAGE-SEROUS FLUID: 3 %
Neutrophil Count, Fluid: 79 %
OTHER CELLS FL: 0 %
Total Nucleated Cell Count, Fluid: 4997 cu mm

## 2015-08-04 LAB — PHOSPHORUS: PHOSPHORUS: 7.7 mg/dL — AB (ref 2.5–4.6)

## 2015-08-04 LAB — APTT: aPTT: 28 seconds (ref 24–36)

## 2015-08-04 LAB — GLUCOSE, SEROUS FLUID: GLUCOSE FL: 188 mg/dL

## 2015-08-04 LAB — GLUCOSE, CAPILLARY
GLUCOSE-CAPILLARY: 166 mg/dL — AB (ref 65–99)
GLUCOSE-CAPILLARY: 113 mg/dL — AB (ref 65–99)
Glucose-Capillary: 152 mg/dL — ABNORMAL HIGH (ref 65–99)
Glucose-Capillary: 221 mg/dL — ABNORMAL HIGH (ref 65–99)

## 2015-08-04 LAB — LACTATE DEHYDROGENASE, PLEURAL OR PERITONEAL FLUID: LD FL: 195 U/L — AB (ref 3–23)

## 2015-08-04 LAB — PROTEIN, BODY FLUID

## 2015-08-04 LAB — PROTIME-INR
INR: 1.16
Prothrombin Time: 15 seconds (ref 11.4–15.0)

## 2015-08-04 LAB — TRIGLYCERIDES: TRIGLYCERIDES: 108 mg/dL (ref <150)

## 2015-08-04 LAB — EXPECTORATED SPUTUM ASSESSMENT W REFEX TO RESP CULTURE: SPECIAL REQUESTS: NORMAL

## 2015-08-04 LAB — EXPECTORATED SPUTUM ASSESSMENT W GRAM STAIN, RFLX TO RESP C

## 2015-08-04 MED ORDER — PENTAFLUOROPROP-TETRAFLUOROETH EX AERO: 1.0000 "application " | INHALATION_SPRAY | CUTANEOUS | Status: DC | PRN

## 2015-08-04 MED ORDER — SODIUM CHLORIDE 0.9 % IV SOLN: 100.0000 mL | INTRAVENOUS | Status: DC | PRN

## 2015-08-04 MED ORDER — NEPRO/CARBSTEADY PO LIQD: 237.0000 mL | ORAL | Status: DC | PRN

## 2015-08-04 MED ORDER — LIDOCAINE HCL (PF) 1 % IJ SOLN
5.0000 mL | INTRAMUSCULAR | Status: DC | PRN
  Filled 2015-08-04: qty 5

## 2015-08-04 MED ORDER — DEXTROSE 5 % IV SOLN
1.0000 g | INTRAVENOUS | Status: AC
  Administered 2015-08-04 – 2015-08-08 (×4): 1 g via INTRAVENOUS
  Filled 2015-08-04 (×6): qty 10

## 2015-08-04 MED ORDER — MORPHINE SULFATE (PF) 2 MG/ML IV SOLN
INTRAVENOUS | Status: AC
  Administered 2015-08-04: 0.5 mg via INTRAVENOUS
  Filled 2015-08-04: qty 1

## 2015-08-04 MED ORDER — ALTEPLASE 2 MG IJ SOLR
2.0000 mg | Freq: Once | INTRAMUSCULAR | Status: DC | PRN
  Filled 2015-08-04: qty 2

## 2015-08-04 MED ORDER — MORPHINE SULFATE (PF) 2 MG/ML IV SOLN
0.5000 mg | Freq: Once | INTRAVENOUS | Status: AC
  Administered 2015-08-04: 0.5 mg via INTRAVENOUS

## 2015-08-04 MED ORDER — HEPARIN SODIUM (PORCINE) 1000 UNIT/ML DIALYSIS
1000.0000 [IU] | INTRAMUSCULAR | Status: DC | PRN
  Filled 2015-08-04: qty 1

## 2015-08-04 MED ORDER — LIDOCAINE-PRILOCAINE 2.5-2.5 % EX CREA: 1.0000 "application " | TOPICAL_CREAM | CUTANEOUS | Status: DC | PRN

## 2015-08-04 NOTE — Progress Notes (Signed)
[facility]  SUBJECTIVE:  Denies chest pain   Filed Vitals:   08/04/15 1000 08/04/15 1100 08/04/15 1200 08/04/15 1300  BP: 123/89 106/61 121/86 113/78  Pulse: 58 109 114 103  Temp:    98.3 F (36.8 C)  TempSrc:    Oral  Resp: 31 28 26 30   Height:      Weight:      SpO2: 100% 99% 100% 97%     Intake/Output Summary (Last 24 hours) at 08/04/15 1323 Last data filed at 08/04/15 0950  Gross per 24 hour  Intake 2465.83 ml  Output     15 ml  Net 2450.83 ml      PHYSICAL EXAM  General: Well developed, well nourished, in  respiratory acute distress HEENT:  Normocephalic and atramatic Neck:  No JVD.  Lungs:  Decreased breath sounds bilaterally Heart:  Irregularly irregular rhythm. Normal S1 and S2 without gallops or murmurs.  Abdomen: Bowel sounds are positive, abdomen soft and non-tender  Msk:  Back normal, normal gait. Normal strength and tone for age. Extremities: No clubbing, cyanosis or edema.   Neuro: Alert and oriented X 3. Psych:  Good affect, responds appropriately   LABS: Basic Metabolic Panel:  Recent Labs  08/02/15 0947 08/03/15 0621  NA 138 134*  K 4.0 4.8  CL 93* 95*  CO2 35* 27  GLUCOSE 115* 165*  BUN 14 20  CREATININE 4.63* 5.15*  CALCIUM 8.0* 7.5*   Liver Function Tests:  Recent Labs  08/02/15 0947  AST 21  ALT 9*  ALKPHOS 192*  BILITOT 1.0  PROT 6.9  ALBUMIN 2.6*    Recent Labs  08/02/15 0947  LIPASE 17*   CBC:  Recent Labs  08/02/15 0947 08/03/15 0621  WBC 15.1* 20.8*  NEUTROABS 11.5*  --   HGB 10.7* 11.3*  HCT 32.9* 35.2  MCV 103.3* 106.8*  PLT 278 189   Cardiac Enzymes:  Recent Labs  08/02/15 0947  TROPONINI 0.03   BNP: Invalid input(s): POCBNP D-Dimer: No results for input(s): DDIMER in the last 72 hours. Hemoglobin A1C: No results for input(s): HGBA1C in the last 72 hours. Fasting Lipid Panel: No results for input(s): CHOL, HDL, LDLCALC, TRIG, CHOLHDL, LDLDIRECT in the last 72 hours. Thyroid  Function Tests: No results for input(s): TSH, T4TOTAL, T3FREE, THYROIDAB in the last 72 hours.  Invalid input(s): FREET3 Anemia Panel: No results for input(s): VITAMINB12, FOLATE, FERRITIN, TIBC, IRON, RETICCTPCT in the last 72 hours.  Dg Chest 2 View  08/03/2015   CLINICAL DATA:  RIGHT pleural effusion.  EXAM: CHEST  2 VIEW  COMPARISON:  08/03/2015.  FINDINGS: Moderate to large RIGHT pleural effusion is present with compressive atelectasis. Cardiopericardial silhouette partially obscured. The cardiopericardial silhouette does appear enlarged. Monitoring leads project over the chest. No LEFT pleural effusion.  IMPRESSION: Moderate to large RIGHT pleural effusion.   Electronically Signed   By: Dereck Ligas M.D.   On: 08/03/2015 15:49   Dg Chest Right Decubitus  08/03/2015   CLINICAL DATA:  RIGHT pleural effusion. RIGHT side down decubitus radiographs.  EXAM: CHEST - RIGHT DECUBITUS  COMPARISON:  08/03/2015.  FINDINGS: RIGHT pleural effusion is large. Majority of it fusion layers dependently with decubitus positioning. Compressive atelectasis of the RIGHT lung associated with the effusion.  IMPRESSION: Large dependently layering RIGHT pleural effusion.   Electronically Signed   By: Dereck Ligas M.D.   On: 08/03/2015 15:47     Echo   TELEMETRY:  Atrial fibrillation:  ASSESSMENT AND  PLAN:  Active Problems:   Pneumonia   Pleural effusion   Acute respiratory failure   Cough   Atrial fibrillation    1.  Atrial fibrillation with a rapid ventricular rate, asymptomatic, compensatory, secondary to respiratory failure 2. Respiratory failure, secondary to pneumonia 3. End-stage renal disease, on hemodialysis   Recommendations  1. Continue current medications 2. Continue propanolol and amiodarone for rate control 3. Consider chronic anticoagulation which can be discussed with Dr. Nehemiah Massed as an outpatient    Isaias Cowman, MD, PhD, [facility] 08/04/2015 1:23 PM

## 2015-08-04 NOTE — Progress Notes (Signed)
ANTIBIOTIC CONSULT NOTE - FOLLOW UP  Pharmacy Consult for Vancomyicn/Levaqiun Indication: pneumonia  Allergies  Allergen Reactions  . Angiotensin Receptor Blockers     Other reaction(s): Other (See Comments) hyperkalemia  . Penicillins Rash    Patient Measurements: Height: 5\' 1"  (154.9 cm) Weight: 213 lb 6.5 oz (96.8 kg) IBW/kg (Calculated) : 47.8 Adjusted Body Weight: 67.4 kg  Vital Signs: Temp: 96.9 F (36.1 C) (08/22 0400) BP: 129/70 mmHg (08/22 0837) Pulse Rate: 102 (08/22 0837) Intake/Output from previous day: 08/21 0701 - 08/22 0700 In: 2235.8 [P.O.:340; I.V.:1795.8; IV Piggyback:100] Out: 15 [Urine:15] Intake/Output from this shift: Total I/O In: 230 [P.O.:230] Out: -   Labs:  Recent Labs  08/02/15 0947 08/03/15 0621  WBC 15.1* 20.8*  HGB 10.7* 11.3*  PLT 278 189  CREATININE 4.63* 5.15*   Estimated Creatinine Clearance: 9.4 mL/min (by C-G formula based on Cr of 5.15). No results for input(s): VANCOTROUGH, VANCOPEAK, VANCORANDOM, GENTTROUGH, GENTPEAK, GENTRANDOM, TOBRATROUGH, TOBRAPEAK, TOBRARND, AMIKACINPEAK, AMIKACINTROU, AMIKACIN in the last 72 hours.   Microbiology: Recent Results (from the past 720 hour(s))  Blood culture (routine x 2)     Status: None (Preliminary result)   Collection Time: 08/02/15 11:59 AM  Result Value Ref Range Status   Specimen Description BLOOD LEFT ASSIST CONTROL  Final   Special Requests   Final    BOTTLES DRAWN AEROBIC AND ANAEROBIC  AER 6CC ANA 1CC   Culture NO GROWTH 2 DAYS  Final   Report Status PENDING  Incomplete  Blood culture (routine x 2)     Status: None (Preliminary result)   Collection Time: 08/02/15 12:00 PM  Result Value Ref Range Status   Specimen Description BLOOD LEFT FOREARM  Final   Special Requests BOTTLES DRAWN AEROBIC AND ANAEROBIC  1CC  Final   Culture NO GROWTH 2 DAYS  Final   Report Status PENDING  Incomplete  Culture, expectorated sputum-assessment     Status: None   Collection Time:  08/02/15  8:37 PM  Result Value Ref Range Status   Specimen Description SPUTUM  Final   Special Requests Normal  Final   Sputum evaluation THIS SPECIMEN IS ACCEPTABLE FOR SPUTUM CULTURE  Final   Report Status 08/04/2015 FINAL  Final  MRSA PCR Screening     Status: Abnormal   Collection Time: 08/02/15  8:37 PM  Result Value Ref Range Status   MRSA by PCR POSITIVE (A) NEGATIVE Final    Comment:        The GeneXpert MRSA Assay (FDA approved for NASAL specimens only), is one component of a comprehensive MRSA colonization surveillance program. It is not intended to diagnose MRSA infection nor to guide or monitor treatment for MRSA infections. CRITICAL RESULT CALLED TO, READ BACK BY AND VERIFIED WITH: MARCEL TURNER AT 2214 08/02/15.PMH   Culture, respiratory (NON-Expectorated)     Status: None (Preliminary result)   Collection Time: 08/02/15  8:37 PM  Result Value Ref Range Status   Specimen Description SPUTUM  Final   Special Requests Normal Reflexed from [id number]  Final   Gram Stain   Final    FAIR SPECIMEN - 70-80% WBCS FEW WBC SEEN MANY GRAM POSITIVE COCCI    Culture HOLDING FOR POSSIBLE PATHOGEN  Final   Report Status PENDING  Incomplete    Anti-infectives    Start     Dose/Rate Route Frequency Ordered Stop   08/04/15 1200  vancomycin (VANCOCIN) IVPB 750 mg/150 ml premix     750 mg 150 mL/hr over  60 Minutes Intravenous Every M-W-F (Hemodialysis) 08/03/15 1755     08/04/15 1000  levofloxacin (LEVAQUIN) tablet 500 mg  Status:  Discontinued     500 mg Oral Every 48 hours 08/02/15 1408 08/04/15 0746   08/04/15 0900  cefTRIAXone (ROCEPHIN) 1 g in dextrose 5 % 50 mL IVPB     1 g 100 mL/hr over 30 Minutes Intravenous Every 24 hours 08/04/15 0746 08/09/15 0859   08/03/15 1400  clindamycin (CLEOCIN) IVPB 600 mg     600 mg 100 mL/hr over 30 Minutes Intravenous 3 times per day 08/03/15 1217     08/03/15 1300  vancomycin (VANCOCIN) 1,500 mg in sodium chloride 0.9 % 500 mL IVPB      1,500 mg 250 mL/hr over 120 Minutes Intravenous  Once 08/03/15 1224 08/03/15 1625   08/02/15 1345  cefTRIAXone (ROCEPHIN) 1 g in dextrose 5 % 50 mL IVPB  Status:  Discontinued     1 g 100 mL/hr over 30 Minutes Intravenous Every 24 hours 08/02/15 1344 08/02/15 1410   08/02/15 1345  levofloxacin (LEVAQUIN) tablet 500 mg  Status:  Discontinued     500 mg Oral Daily 08/02/15 1344 08/02/15 1408   08/02/15 1045  levofloxacin (LEVAQUIN) IVPB 750 mg     750 mg 100 mL/hr over 90 Minutes Intravenous  Once 08/02/15 1038 08/02/15 1332      Assessment: 81 y/o F with RLL HCAP and h/o PCN allergy ordered empiric abx with clindamycin, ceftriaxone, and vancomycin.   Goal of Therapy:  Pre-HD vancomycin level: 15-25  Plan:  Continue vancomycin 750 mg iv q HD. Will f/u dialysis plans/schedule and plan on checking a HD trough with the third session Friday.   Ulice Dash D 08/04/2015,11:31 AM

## 2015-08-04 NOTE — Progress Notes (Signed)
[name] is a 81 y.o. female   SUBJECTIVE:  Patient admitted with progressive cough, dyspnea, tachycardia and hypotension. Currently off IV pressors. Coughing up yellow/green sputum, heart rate controlled.  ______________________________________________________________________  ROS: Review of systems is unremarkable for any active cardiac,respiratory, GI, GU, hematologic, neurologic or psychiatric systems, 10 systems reviewed.  Marland Kitchen amiodarone  100 mg Oral Daily  . cefTRIAXone (ROCEPHIN)  IV  1 g Intravenous Q24H  . Chlorhexidine Gluconate Cloth  6 each Topical Q0600  . clindamycin (CLEOCIN) IV  600 mg Intravenous 3 times per day  . dextromethorphan  30 mg Oral BID   And  . guaiFENesin  600 mg Oral BID  . heparin  5,000 Units Subcutaneous 3 times per day  . insulin aspart  0-5 Units Subcutaneous QHS  . insulin aspart  0-9 Units Subcutaneous TID WC  . ipratropium-albuterol  3 mL Nebulization Q4H  . mupirocin ointment  1 application Nasal BID  . propranolol  80 mg Oral BID  . vancomycin  750 mg Intravenous Q M,W,F-HD   acetaminophen **OR** acetaminophen, benzonatate, docusate sodium, ondansetron (ZOFRAN) IV, senna   Past Medical History  Diagnosis Date  . Skin cancer     Resected from legs  . Renal insufficiency     Patient is on dialysis and normal days are M,W and F.  . Diabetes mellitus without complication     Patient takes Insulin  . ESRD (end stage renal disease)     Monday, wednesday, Friday DIalysis  . HTN (hypertension)   . Essential tremor   . OSA (obstructive sleep apnea)   . A-fib     not on anticoagulation  . Bilateral lower extremity edema   . Osteoarthritis     Past Surgical History  Procedure Laterality Date  . Abdominal hysterectomy    . Cholecystectomy    . Femur fracture surgery      left femur  . Tubal ligation    . Ankle fracture surgery      right ankle  . Neuroplasty / transposition median nerve at carpal tunnel bilateral    . Knee  arthroplasty      PHYSICAL EXAM:  BP 110/63 mmHg  Pulse 79  Temp(Src) 96.9 F (36.1 C) (Oral)  Resp 20  Ht 5\' 1"  (1.549 m)  Wt 96.8 kg (213 lb 6.5 oz)  BMI 40.34 kg/m2  SpO2 100%  Wt Readings from Last 3 Encounters:  08/03/15 96.8 kg (213 lb 6.5 oz)           BP Readings from Last 3 Encounters:  08/04/15 110/63    Constitutional: NAD Neck: supple, no thyromegaly Respiratory: Reduced breath sounds on the right Cardiovascular: RRR, no murmur, no gallop Abdomen: soft, good BS, nontender Extremities: no edema Neuro: alert and oriented, no focal motor or sensory deficits  ASSESSMENT/PLAN:  Labs and imaging studies were reviewed  Progressive complicated pneumonia-is been on Levaquin several times, will switch to Rocephin/clindamycin/vancomycin, sputum culture pending Right effusion-thoracentesis for parapneumonic effusion per ICU team Hypotension-resolved, off of IV pressors Diabetes-sliding scale ESRD-dialysis Monday Wednesday Friday OSA-continue CPAP at bedtime

## 2015-08-04 NOTE — Progress Notes (Signed)
PT Cancellation Note  Patient Details Name: [name] MRN: [id number] DOB: Sep 22, 1934   Cancelled Treatment:    Reason Eval/Treat Not Completed: Patient not medically ready (Pt was attempted and chart review shows that pt was trasferred to higher LOC, awaiting new PT order and then pt will attempt therapy.)   Bernestine Amass 08/04/2015, 9:48 AM

## 2015-08-04 NOTE — Progress Notes (Signed)
Patient ID: [name], female   DOB: [birth year], 81 y.o.   MRN: [id number]  Chief Complaint  Patient presents with  . Weakness  . Emesis    Referred By Dr. Edwin Tucker Reason for Referral management of bilateral pleural effusions  HPI Location, Quality, Duration, Severity, Timing, Context, Modifying Factors, Associated Signs and Symptoms.  [name] is a 81 y.o. female.  I have personally seen and examined this patient. I discussed her care with Dr. Edwin Tucker. [place] is a 81 year old woman with a history of diabetes and dialysis for the last 6 years. She states over the last year she's been extremely short of breath and has had multiple episodes of falling at home he causes her legs give out. She has had some issues with dialysis and she is scheduled to have an angioplasty of her graft at some point in the very near future. She comes in with a several week history of progressive shortness of breath. On chest x-ray she has some dependent atelectasis and small bilateral pleural effusions. Over the last several days her right sided pleural effusion has enlarged and she underwent a diagnostic and for therapeutic thoracentesis today. She states that she cannot tell any significant difference in her overall clinical status pre-and post thoracentesis. Of note is that the patient states that she has been short of breath for a considerable period of time and has also been in atrial fibrillation as well. Independent review of her recent set chest CT shows a severely calcified mitral valve with evidence of pulmonary hypertension with a dilated right ventricle and right atrium. The patient states she's lost approximately 30 pounds and has very little appetite.   Past Medical History  Diagnosis Date  . Skin cancer     Resected from legs  . Renal insufficiency     Patient is on dialysis and normal days are M,W and F.  . Diabetes mellitus without complication     Patient takes Insulin  . ESRD (end stage renal  disease)     Monday, wednesday, Friday DIalysis  . HTN (hypertension)   . Essential tremor   . OSA (obstructive sleep apnea)   . A-fib     not on anticoagulation  . Bilateral lower extremity edema   . Osteoarthritis     Past Surgical History  Procedure Laterality Date  . Abdominal hysterectomy    . Cholecystectomy    . Femur fracture surgery      left femur  . Tubal ligation    . Ankle fracture surgery      right ankle  . Neuroplasty / transposition median nerve at carpal tunnel bilateral    . Knee arthroplasty      Family History  Problem Relation Age of Onset  . Diabetes type II Father     Social History Social History  Substance Use Topics  . Smoking status: Never Smoker   . Smokeless tobacco: Never Used  . Alcohol Use: No    Allergies  Allergen Reactions  . Angiotensin Receptor Blockers     Other reaction(s): Other (See Comments) hyperkalemia  . Penicillins Rash    Current Facility-Administered Medications  Medication Dose Route Frequency Provider Last Rate Last Dose  . acetaminophen (TYLENOL) tablet 650 mg  650 mg Oral Q6H PRN Gladstone Lighter, MD   650 mg at 08/02/15 2030   Or  . acetaminophen (TYLENOL) suppository 650 mg  650 mg Rectal Q6H PRN Gladstone Lighter, MD      . amiodarone (  PACERONE) tablet 100 mg  100 mg Oral Daily Isaias Cowman, MD   100 mg at 08/04/15 1323  . benzonatate (TESSALON) capsule 100 mg  100 mg Oral TID PRN Glendon Axe, MD   100 mg at 08/04/15 0406  . cefTRIAXone (ROCEPHIN) 1 g in dextrose 5 % 50 mL IVPB  1 g Intravenous Q24H Rusty Aus, MD   1 g at 08/04/15 0854  . Chlorhexidine Gluconate Cloth 2 % PADS 6 each  6 each Topical Q0600 Glendon Axe, MD   6 each at 08/04/15 [phone]  . clindamycin (CLEOCIN) IVPB 600 mg  600 mg Intravenous 3 times per day Glendon Axe, MD   600 mg at 08/04/15 1459  . dextromethorphan (DELSYM) 30 MG/5ML liquid 30 mg  30 mg Oral BID Laverle Hobby, MD   30 mg at 08/04/15 1323   And  .  guaiFENesin (MUCINEX) 12 hr tablet 600 mg  600 mg Oral BID Laverle Hobby, MD   600 mg at 08/04/15 1323  . docusate sodium (COLACE) capsule 100 mg  100 mg Oral BID PRN Gladstone Lighter, MD      . heparin injection 5,000 Units  5,000 Units Subcutaneous 3 times per day Gladstone Lighter, MD   5,000 Units at 08/04/15 1525  . insulin aspart (novoLOG) injection 0-5 Units  0-5 Units Subcutaneous QHS Gladstone Lighter, MD   3 Units at 08/02/15 2225  . insulin aspart (novoLOG) injection 0-9 Units  0-9 Units Subcutaneous TID WC Gladstone Lighter, MD   3 Units at 08/04/15 1329  . ipratropium-albuterol (DUONEB) 0.5-2.5 (3) MG/3ML nebulizer solution 3 mL  3 mL Nebulization Q4H Gladstone Lighter, MD   3 mL at 08/04/15 1558  . mupirocin ointment (BACTROBAN) 2 % 1 application  1 application Nasal BID Glendon Axe, MD   1 application at 99/37/16 1322  . norepinephrine (LEVOPHED) 4mg  in D5W 26mL premix infusion  0-40 mcg/min Intravenous Titrated Glendon Axe, MD 18.8 mL/hr at 08/03/15 0600 5 mcg/min at 08/03/15 0600  . ondansetron (ZOFRAN) injection 4 mg  4 mg Intravenous Q6H PRN Gladstone Lighter, MD      . propranolol (INDERAL) tablet 80 mg  80 mg Oral BID Isaias Cowman, MD   80 mg at 08/04/15 1323  . senna (SENOKOT) tablet 8.6 mg  1 tablet Oral Daily PRN Gladstone Lighter, MD      . vancomycin (VANCOCIN) IVPB 750 mg/150 ml premix  750 mg Intravenous Q M,W,F-HD Glendon Axe, MD   750 mg at 08/04/15 1330      Review of Systems A complete review of systems was asked and was negative except for the following positive findings positive for shortness of breath, rapid heart rate, early satiety and weight loss.  Blood pressure 117/57, pulse 114, temperature 98.3 F (36.8 C), temperature source Oral, resp. rate 23, height 5\' 1"  (1.549 m), weight 96.8 kg (213 lb 6.5 oz), SpO2 96 %.  Physical Exam CONSTITUTIONAL:  Pleasant, well-developed, well-nourished, and in no acute distress. EYES: Pupils equal  and reactive to light, Sclera non-icteric EARS, NOSE, MOUTH AND THROAT:  The oropharynx was clear.  Dentition is good repair.  Oral mucosa pink and moist. LYMPH NODES:  Lymph nodes in the neck and axillae were normal RESPIRATORY:  Lungs were clear but very distant bilaterally..  Normal respiratory effort without pathologic use of accessory muscles of respiration CARDIOVASCULAR: Heart was irregularly irregular without murmurs.  There were no carotid bruits.  There is a functioning graft present in the left forearm  with a palpable thrill GI: The abdomen was soft, nontender, and nondistended. There were no palpable masses. There was no hepatosplenomegaly. There were normal bowel sounds in all quadrants. GU:  Rectal deferred.   MUSCULOSKELETAL:  Normal muscle strength and tone.  No clubbing or cyanosis.   SKIN:  There were no pathologic skin lesions.  There were no nodules on palpation. NEUROLOGIC:  Sensation is normal.  Cranial nerves are grossly intact. PSYCH:  Oriented to person, place and time.  Mood and affect are normal.  Data Reviewed I have independently reviewed the patient's chest x-rays and CT scans  I have personally reviewed the patient's imaging, laboratory findings and medical records.    Assessment    I have independently reviewed the patient's chest x-ray and CT scans. She does have small bilateral pleural effusions. She underwent thoracentesis today and I will await the results of those. I would also recommend that we obtain an echocardiogram to assess her mitral valve disease and her pulmonary hypertension. I would alert Dr. Corene Cornea dew to the patient's presence of that he may fix her fistula. Her increasing shortness of breath may be related to a poorly functioning AV fistula. The post procedure chest x-ray did not show any evidence of pneumothorax and only a very small pleural effusion.    Plan    I will await the results of her thoracentesis. I discussed this with Dr. Edwin Tucker. I  do not believe that she is a candidate for surgical intervention at this time. I would recommend Dr. dew see the patient and proceed on with the proposed procedure later this week.       Nestor Lewandowsky, MD 08/04/2015, 4:46 PM

## 2015-08-04 NOTE — Progress Notes (Signed)
Subjective:  Pt admitted with pneumonia and shortness of breath. Had HD on Friday. Reports cough and sputum this morning. Continues to have atrial fibrillation and tachycardia    Objective:  Vital signs in last 24 hours:  Temp:  [96.9 F (36.1 C)-97.6 F (36.4 C)] 96.9 F (36.1 C) (08/22 0400) Pulse Rate:  [38-122] 102 (08/22 0837) Resp:  [18-38] 29 (08/22 0837) BP: (77-129)/(45-74) 129/70 mmHg (08/22 0837) SpO2:  [87 %-100 %] 99 % (08/22 0837)  Weight change:  Filed Weights   08/02/15 0858 08/02/15 1342 08/03/15 0300  Weight: 94.802 kg (209 lb) 92.534 kg (204 lb) 96.8 kg (213 lb 6.5 oz)    Intake/Output: I/O last 3 completed shifts: In: 3086.4 [P.O.:340; I.V.:2646.4; IV Piggyback:100] Out: 15 [Urine:15]   Intake/Output this shift:  Total I/O In: 230 [P.O.:230] Out: -   Physical Exam: General: NAD, resting in bed  Head: Normocephalic, atraumatic. Moist oral mucosal membranes  Eyes: Anicteric  Neck: Supple, trachea midline  Lungs:  Bibasilar rales , Rt lower field rhonchi  Heart: Irregular, A Fib, tachycardic  Abdomen:  Soft, nontender, obese, BS present  Extremities:  1+ peripheral edema.  Neurologic: Nonfocal, moving all four extremities  Skin: No lesions  Access: LUE AVF    Basic Metabolic Panel:  Recent Labs Lab 08/02/15 0947 08/03/15 0621  NA 138 134*  K 4.0 4.8  CL 93* 95*  CO2 35* 27  GLUCOSE 115* 165*  BUN 14 20  CREATININE 4.63* 5.15*  CALCIUM 8.0* 7.5*    Liver Function Tests:  Recent Labs Lab 08/02/15 0947  AST 21  ALT 9*  ALKPHOS 192*  BILITOT 1.0  PROT 6.9  ALBUMIN 2.6*    Recent Labs Lab 08/02/15 0947  LIPASE 17*   No results for input(s): AMMONIA in the last 168 hours.  CBC:  Recent Labs Lab 08/02/15 0947 08/03/15 0621  WBC 15.1* 20.8*  NEUTROABS 11.5*  --   HGB 10.7* 11.3*  HCT 32.9* 35.2  MCV 103.3* 106.8*  PLT 278 189    Cardiac Enzymes:  Recent Labs Lab 08/02/15 0947  TROPONINI 0.03     BNP: Invalid input(s): POCBNP  CBG:  Recent Labs Lab 08/03/15 0726 08/03/15 1049 08/03/15 1612 08/03/15 1954 08/04/15 0733  GLUCAP 163* 219* 182* 185* 152*    Microbiology: Results for orders placed or performed during the hospital encounter of 08/02/15  Blood culture (routine x 2)     Status: None (Preliminary result)   Collection Time: 08/02/15 11:59 AM  Result Value Ref Range Status   Specimen Description BLOOD LEFT ASSIST CONTROL  Final   Special Requests   Final    BOTTLES DRAWN AEROBIC AND ANAEROBIC  AER 6CC ANA 1CC   Culture NO GROWTH 2 DAYS  Final   Report Status PENDING  Incomplete  Blood culture (routine x 2)     Status: None (Preliminary result)   Collection Time: 08/02/15 12:00 PM  Result Value Ref Range Status   Specimen Description BLOOD LEFT FOREARM  Final   Special Requests BOTTLES DRAWN AEROBIC AND ANAEROBIC  1CC  Final   Culture NO GROWTH 2 DAYS  Final   Report Status PENDING  Incomplete  Culture, expectorated sputum-assessment     Status: None (Preliminary result)   Collection Time: 08/02/15  8:37 PM  Result Value Ref Range Status   Specimen Description SPUTUM  Final   Special Requests Normal  Final   Sputum evaluation THIS SPECIMEN IS ACCEPTABLE FOR SPUTUM CULTURE  Final  Report Status PENDING  Incomplete  MRSA PCR Screening     Status: Abnormal   Collection Time: 08/02/15  8:37 PM  Result Value Ref Range Status   MRSA by PCR POSITIVE (A) NEGATIVE Final    Comment:        The GeneXpert MRSA Assay (FDA approved for NASAL specimens only), is one component of a comprehensive MRSA colonization surveillance program. It is not intended to diagnose MRSA infection nor to guide or monitor treatment for MRSA infections. CRITICAL RESULT CALLED TO, READ BACK BY AND VERIFIED WITH: MARCEL TURNER AT 2214 08/02/15.PMH   Culture, respiratory (NON-Expectorated)     Status: None (Preliminary result)   Collection Time: 08/02/15  8:37 PM  Result Value  Ref Range Status   Specimen Description SPUTUM  Final   Special Requests Normal Reflexed from [id number]  Final   Gram Stain   Final    FAIR SPECIMEN - 70-80% WBCS FEW WBC SEEN MANY GRAM POSITIVE COCCI    Culture TOO YOUNG TO READ  Final   Report Status PENDING  Incomplete    Coagulation Studies: No results for input(s): LABPROT, INR in the last 72 hours.  Urinalysis:  Recent Labs  08/03/15 1420  COLORURINE AMBER*  LABSPEC 1.015  PHURINE 7.0  GLUCOSEU NEGATIVE  HGBUR 1+*  BILIRUBINUR NEGATIVE  KETONESUR NEGATIVE  PROTEINUR 100*  NITRITE NEGATIVE  LEUKOCYTESUR 2+*      Imaging: Dg Chest 2 View  08/03/2015   CLINICAL DATA:  RIGHT pleural effusion.  EXAM: CHEST  2 VIEW  COMPARISON:  08/03/2015.  FINDINGS: Moderate to large RIGHT pleural effusion is present with compressive atelectasis. Cardiopericardial silhouette partially obscured. The cardiopericardial silhouette does appear enlarged. Monitoring leads project over the chest. No LEFT pleural effusion.  IMPRESSION: Moderate to large RIGHT pleural effusion.   Electronically Signed   By: Dereck Ligas M.D.   On: 08/03/2015 15:49   Dg Chest Right Decubitus  08/03/2015   CLINICAL DATA:  RIGHT pleural effusion. RIGHT side down decubitus radiographs.  EXAM: CHEST - RIGHT DECUBITUS  COMPARISON:  08/03/2015.  FINDINGS: RIGHT pleural effusion is large. Majority of it fusion layers dependently with decubitus positioning. Compressive atelectasis of the RIGHT lung associated with the effusion.  IMPRESSION: Large dependently layering RIGHT pleural effusion.   Electronically Signed   By: Dereck Ligas M.D.   On: 08/03/2015 15:47     Medications:   . norepinephrine 5 mcg/min (08/03/15 0600)   . amiodarone  100 mg Oral Daily  . cefTRIAXone (ROCEPHIN)  IV  1 g Intravenous Q24H  . Chlorhexidine Gluconate Cloth  6 each Topical Q0600  . clindamycin (CLEOCIN) IV  600 mg Intravenous 3 times per day  . dextromethorphan  30 mg Oral BID   And   . guaiFENesin  600 mg Oral BID  . heparin  5,000 Units Subcutaneous 3 times per day  . insulin aspart  0-5 Units Subcutaneous QHS  . insulin aspart  0-9 Units Subcutaneous TID WC  . ipratropium-albuterol  3 mL Nebulization Q4H  . mupirocin ointment  1 application Nasal BID  . propranolol  80 mg Oral BID  . vancomycin  750 mg Intravenous Q M,W,F-HD   acetaminophen **OR** acetaminophen, benzonatate, docusate sodium, ondansetron (ZOFRAN) IV, senna  Assessment/ Plan:  81 y.o. female Diabetes mellitus type 2 x 20 years, ESRD ON HD, HTN ,Psoriasis, Sleep apnea, CPAP, Recurrent vertigo, Essential tremor, Bilateral knee replacement, ACD, Secondary hyperparathyroidism, Cholcystecomy, Hysterectomy, Bilateral Tubal ligation, Carpal tunnel release, left  radiocephalic AVF 06/18/27, admission for chest pain at [facility] 9/13, admission to [facility] for Afib 10/14, on chronic anticoagulation, left hip fracture, left ureteral stones, admission for pneumonia 07/2015.  1.  ESRD on HD MWF:  - Dialysis today - UF as tolerated  2.  Hypotension:   - likely due to arrythmia - continue to monitor  3.  Anemia of CKD:   - hgb 11.3, hold epogen for now.  4.  SHPTH:  - monitor Phos this hosptialization   LOS: 2 Fontaine Hehl 8/22/201610:55 AM

## 2015-08-04 NOTE — Procedures (Signed)
Successful US guided right sided thoracentesis with 1L of serous pleural fluid aspirated.   Samples sent to lab for analysis. No immediate complications.

## 2015-08-04 NOTE — Progress Notes (Signed)
[facility] Consultation      Assessment and Plan:81 yo female seen in consultation for respiratory distress, right lower lobe pneumonia, right lower lobe pleural effusion  Pneumonia -Suspected healthcare associated pneumonia. Continue antibiotics including clinda/ceftriaxone/vanc, which appears to be appropriate -Await results of sputum cultures. --> currently with normal flora -The patient will need outpatient follow-up which will include pulmonary testing as well as repeat imaging to ensure resolution of the changes seen on the chest x-ray. Follow up with [facility], Dr. Peterson Ao [phone]) 2-3 weeks after hospital discharge.   Pleural effusion -Cannot rule out the possibility of a parapneumonic effusion.2view CXR with layering of Right Pl effusion. - recommend IR u/s guided thoracentesis, send studies for cytology, culture (micro, fungal, afb, etc) - I have asked Dr. Marta Lamas (CTS) to review the CT images to evaluate the possibility of loculated effusion with pleural thickening and subsequent atelectasis, which may portend her to an evaluation for a surgical procedure if IR thoracentesis is inconclusive.   Acute respiratory failure -Secondary to pneumonia and pleural effusion. - incentive spirometry and flutter valve   Cough Productive of green sputum secondary to pneumonia. - incentive spirometry and flutter valve   Atrial fibrillation -With fast heart rate, which is now controlled. Cardiology service is following.   Date: 08/04/2015  MRN# [id number] Blakelyn Dinges 10-26-1934  Referring Physician:   Ami Mally is a 81 y.o. old female seen in consultation for chief complaint of:    Chief Complaint  Patient presents with  . Weakness  . Emesis    SUBJECTIVE: Awake this morning, noted with coarse upper airway breath sounds, husband and niece at bedside. States breathing is stable but still difficult, no other complaints. Also,  stated thick yellow sputum with coughing.   HPI:   Izetta Sakamoto is a 81 y.o. female with a known history of end-stage renal disease on Monday Wednesday Friday dialysis, hypertension, atrial fibrillation not on anticoagulation, insulin-dependent diabetes mellitus, arthritis.  The patient presented to the hospital recently due to persistent cough and shortness of breath, which began approximately 3 weeks ago. The symptoms started with mildly increasing dyspnea and a mild cough. Symptoms progressed with worsening dyspnea and increasingly productive cough which is productive of greenish to yellowish sputum. There was no exacerbating or relieving factors. Her breathing would get worse with ambulation and improved with rest. She had no dyspnea while at rest. Her recent history significant for a femoral fracture in March of this year. Her course after that episode was compensated by an episode of pneumonia requiring antibiotics. She has no known history of pulmonary disease in the past. She is a lifelong nonsmoker. Her respiratory status was normal before this episode occurred. She does not use any inhaled medications at home. She has no history of chronic cough in the past. Her presentation to hospital was completed by development of atrial fibrillation with rapid ventricular rate, and hypotension for which she was transferred to the intensive care unit last night. Currently, her nurse status is better. Her heart rate is much better controlled. Her blood pressure is now stable. She is eating planned to be transferred to the floor later today, however, she continues to have this severe cough and dyspnea. She is no other particular complaints at this time.  PMHX:   Past Medical History  Diagnosis Date  . Skin cancer     Resected from legs  . Renal insufficiency     Patient is on dialysis and  normal days are M,W and F.  . Diabetes mellitus without complication     Patient takes Insulin  . ESRD (end stage  renal disease)     Monday, wednesday, Friday DIalysis  . HTN (hypertension)   . Essential tremor   . OSA (obstructive sleep apnea)   . A-fib     not on anticoagulation  . Bilateral lower extremity edema   . Osteoarthritis    Surgical Hx:  Past Surgical History  Procedure Laterality Date  . Abdominal hysterectomy    . Cholecystectomy    . Femur fracture surgery      left femur  . Tubal ligation    . Ankle fracture surgery      right ankle  . Neuroplasty / transposition median nerve at carpal tunnel bilateral    . Knee arthroplasty     Family Hx:  Family History  Problem Relation Age of Onset  . Diabetes type II Father    Social Hx:   Social History  Substance Use Topics  . Smoking status: Never Smoker   . Smokeless tobacco: Never Used  . Alcohol Use: No   Medication:   No current outpatient prescriptions on file.    Allergies:  Angiotensin receptor blockers and Penicillins  Review of Systems: Gen:  Denies  fever, sweats, chills HEENT: Denies blurred vision, double vision. bleeds, sore throat Cvc:  No dizziness, chest pain. Resp:  Admits to cough and sputum production (thick yellow).  Gi: Denies swallowing difficulty, stomach pain. Gu:  Denies bladder incontinence, burning urine Ext:   No Joint pain, stiffness. Skin: No skin rash,  hives Endoc:  No polyuria, polydipsia. Psych: No depression, insomnia. Other:  All other systems were reviewed with the patient and were negative other that what is mentioned in the HPI.   Physical Examination:   VS: BP 129/70 mmHg  Pulse 102  Temp(Src) 96.9 F (36.1 C) (Oral)  Resp 29  Ht 5\' 1"  (1.549 m)  Wt 213 lb 6.5 oz (96.8 kg)  BMI 40.34 kg/m2  SpO2 99%  General Appearance: No distress  Neuro:without focal findings,  speech normal,  HEENT: PERRLA, EOM intact.   Pulmonary: coarse upper airway sounds, dec BS on the RLL, good respiratory effort.   CardiovascularNormal S1,S2.  No m/r/g.   Abdomen: Benign, Soft,  non-tender. Renal:  No costovertebral tenderness  GU:  No performed at this time. Endoc: No evident thyromegaly, no signs of acromegaly. Skin:   warm, no rashes, no ecchymosis  Extremities: normal, no cyanosis, clubbing.  Other findings: I reviewed the patient's most recent imaging, as well as reality reports, which included a chest x-ray from 08/02/2015 and a CT of the chest from 07/24/2015 that shows a right basal opacity in the chest x-ray and CT of the chest shows a right basal opacity with atelectasis versus pneumonia as well as a bilateral pleural effusion which appears to be larger on the right side.    LABORATORY PANEL:   CBC  Recent Labs Lab 08/03/15 0621  WBC 20.8*  HGB 11.3*  HCT 35.2  PLT 189   ------------------------------------------------------------------------------------------------------------------  Chemistries   Recent Labs Lab 08/02/15 0947 08/03/15 0621  NA 138 134*  K 4.0 4.8  CL 93* 95*  CO2 35* 27  GLUCOSE 115* 165*  BUN 14 20  CREATININE 4.63* 5.15*  CALCIUM 8.0* 7.5*  AST 21  --   ALT 9*  --   ALKPHOS 192*  --   BILITOT 1.0  --    ------------------------------------------------------------------------------------------------------------------  Cardiac Enzymes  Recent Labs Lab 08/02/15 0947  TROPONINI 0.03   ------------------------------------------------------------   Rad results:  See EMR    I have personally obtained a history, examined the patient, evaluated laboratory and imaging results, formulated the assessment and plan and placed orders.  The patient is ill with  and requires high complexity decision making for assessment and support, frequent evaluation and titration of therapies, application of advanced monitoring technologies and extensive interpretation of multiple databases. Pulmonary consult time devoted to patient care services described in this note is 45 minutes.    Vilinda Boehringer, MD [facility] Pager [phone] (Please enter [phone]-[phone])

## 2015-08-05 ENCOUNTER — Inpatient Hospital Stay: Payer: Medicare Other

## 2015-08-05 ENCOUNTER — Inpatient Hospital Stay: Admit: 2015-08-05 | Payer: Medicare Other

## 2015-08-05 ENCOUNTER — Inpatient Hospital Stay
Admit: 2015-08-05 | Discharge: 2015-08-05 | Disposition: A | Discharge status: Home / Self Care | Payer: Medicare Other | Attending: Internal Medicine | Admitting: Internal Medicine

## 2015-08-05 DIAGNOSIS — J189 Pneumonia, unspecified organism: Secondary | ICD-10-CM | POA: Insufficient documentation

## 2015-08-05 LAB — GLUCOSE, CAPILLARY
GLUCOSE-CAPILLARY: 240 mg/dL — AB (ref 65–99)
GLUCOSE-CAPILLARY: 228 mg/dL — AB (ref 65–99)
Glucose-Capillary: 152 mg/dL — ABNORMAL HIGH (ref 65–99)
Glucose-Capillary: 174 mg/dL — ABNORMAL HIGH (ref 65–99)

## 2015-08-05 LAB — CULTURE, RESPIRATORY W GRAM STAIN
Culture: NORMAL
Special Requests: NORMAL

## 2015-08-05 LAB — CBC WITH DIFFERENTIAL/PLATELET
BASOS PCT: 0 %
Basophils Absolute: 0 10*3/uL (ref 0–0.1)
EOS ABS: 0.2 10*3/uL (ref 0–0.7)
EOS PCT: 1 %
HEMATOCRIT: 31.2 % — AB (ref 35.0–47.0)
HEMOGLOBIN: 10.3 g/dL — AB (ref 12.0–16.0)
LYMPHS PCT: 10 %
Lymphs Abs: 1.5 10*3/uL (ref 1.0–3.6)
MCH: 34.4 pg — AB (ref 26.0–34.0)
MCHC: 33.1 g/dL (ref 32.0–36.0)
MCV: 104.1 fL — AB (ref 80.0–100.0)
Monocytes Absolute: 1.4 10*3/uL — ABNORMAL HIGH (ref 0.2–0.9)
Monocytes Relative: 9 %
NEUTROS ABS: 12.1 10*3/uL — AB (ref 1.4–6.5)
NEUTROS PCT: 80 %
Platelets: 181 10*3/uL (ref 150–440)
RBC: 3 MIL/uL — ABNORMAL LOW (ref 3.80–5.20)
RDW: 15.4 % — ABNORMAL HIGH (ref 11.5–14.5)
SMEAR REVIEW: ADEQUATE
WBC: 15.2 10*3/uL — ABNORMAL HIGH (ref 3.6–11.0)

## 2015-08-05 LAB — PH, BODY FLUID: pH, Body Fluid: 7.5

## 2015-08-05 LAB — BASIC METABOLIC PANEL
ANION GAP: 10 (ref 5–15)
BUN: 18 mg/dL (ref 6–20)
CHLORIDE: 97 mmol/L — AB (ref 101–111)
CO2: 31 mmol/L (ref 22–32)
Calcium: 7.8 mg/dL — ABNORMAL LOW (ref 8.9–10.3)
Creatinine, Ser: 4.09 mg/dL — ABNORMAL HIGH (ref 0.44–1.00)
GFR calc Af Amer: 11 mL/min — ABNORMAL LOW (ref >60)
GFR, EST NON AFRICAN AMERICAN: 10 mL/min — AB (ref >60)
GLUCOSE: 163 mg/dL — AB (ref 65–99)
POTASSIUM: 4.8 mmol/L (ref 3.5–5.1)
Sodium: 138 mmol/L (ref 135–145)

## 2015-08-05 LAB — PARATHYROID HORMONE, INTACT (NO CA): PTH: 43 pg/mL (ref 15–65)

## 2015-08-05 MED ORDER — CALCIUM ACETATE (PHOS BINDER) 667 MG PO CAPS
1334.0000 mg | ORAL_CAPSULE | Freq: Three times a day (TID) | ORAL | Status: DC
  Administered 2015-08-05 – 2015-08-11 (×17): 1334 mg via ORAL
  Filled 2015-08-05 (×18): qty 2

## 2015-08-05 MED ORDER — SEVELAMER CARBONATE 800 MG PO TABS
1600.0000 mg | ORAL_TABLET | Freq: Three times a day (TID) | ORAL | Status: DC
  Administered 2015-08-05 – 2015-08-11 (×17): 1600 mg via ORAL
  Filled 2015-08-05 (×18): qty 2

## 2015-08-05 MED ORDER — SODIUM CHLORIDE 0.9 % IV SOLN: INTRAVENOUS | Status: DC

## 2015-08-05 MED ORDER — CHLORHEXIDINE GLUCONATE CLOTH 2 % EX PADS
6.0000 | MEDICATED_PAD | Freq: Once | CUTANEOUS | Status: AC
  Administered 2015-08-06: 6 via TOPICAL

## 2015-08-05 NOTE — Progress Notes (Addendum)
[facility] Progress Note      Assessment and Plan:  81 yo female seen in consultation for  right lower lobe pneumonia, with parapneumonic effusion.   Pneumonia -Suspected healthcare associated pneumonia. Continue antibiotics. -Await results of sputum cultures. --> currently with normal flora -s/p thoracentesis on 8/22, results show exudative effusion consistent with parapneumonic effusion. -The patient will need outpatient follow-up which will include pulmonary function testing as well as repeat imaging to ensure resolution of the changes seen on the chest x-ray.    Pleural effusion/Parapneumonic effusion -s/p thoracentesis on 8/22, with removal of 1 L of fluid from the right pleural space consistent with parapneumonic effusion.   Acute respiratory failure -Secondary to pneumonia and pleural effusion, now doing better. - incentive spirometry and flutter valve   Cough Productive of green sputum secondary to pneumonia. - incentive spirometry and flutter valve   Atrial fibrillation -With fast heart rate, which is now controlled. Cardiology service is following.  Follow up with [facility], Dr. Peterson Ao [phone]) 2-3 weeks after hospital discharge.  Date: 08/05/2015  MRN# [id number] [name] 11/07/1944  Referring Physician:   [name] is a 81 y.o. old female seen in consultation for chief complaint of:    Chief Complaint  Patient presents with  . Weakness  . Emesis    SUBJECTIVE: Awake this morning, states that she is dong much better today, improved breathing.   SIGNIFICANT EVENTS: 8/22>>U/S thoracentesis (right)   HPI:   Medication:   No current outpatient prescriptions on file.    Allergies:  Angiotensin receptor blockers and Penicillins  Review of Systems: Gen:  Denies  fever, sweats, chills HEENT: Denies blurred vision, double vision. bleeds, sore throat Cvc:  No dizziness, chest pain. Resp:  Admits  to cough and sputum production (thick yellow).  Gi: Denies swallowing difficulty, stomach pain. Gu:  Denies bladder incontinence, burning urine Ext:   No Joint pain, stiffness. Skin: No skin rash,  hives Endoc:  No polyuria, polydipsia. Psych: No depression, insomnia. Other:  All other systems were reviewed with the patient and were negative other that what is mentioned in the HPI.   Physical Examination:   VS: BP 111/45 mmHg  Pulse 83  Temp(Src) 97.9 F (36.6 C) (Oral)  Resp 16  Ht 5\' 1"  (1.549 m)  Wt 101 kg (222 lb 10.6 oz)  BMI 42.09 kg/m2  SpO2 100%  General Appearance: No distress  Neuro:without focal findings,  speech normal,  HEENT: PERRLA, EOM intact.   Pulmonary: coarse upper airway sounds, dec BS on the RLL, good respiratory effort.   CardiovascularNormal S1,S2.  No m/r/g.   Abdomen: Benign, Soft, non-tender. Renal:  No costovertebral tenderness  GU:  No performed at this time. Endoc: No evident thyromegaly, no signs of acromegaly. Skin:   warm, no rashes, no ecchymosis  Extremities: normal, no cyanosis, clubbing.    LABORATORY PANEL:   CBC  Recent Labs Lab 08/05/15 0822  WBC 15.2*  HGB 10.3*  HCT 31.2*  PLT 181   ------------------------------------------------------------------------------------------------------------------  Chemistries   Recent Labs Lab 08/02/15 0947  08/05/15 0822  NA 138  < > 138  K 4.0  < > 4.8  CL 93*  < > 97*  CO2 35*  < > 31  GLUCOSE 115*  < > 163*  BUN 14  < > 18  CREATININE 4.63*  < > 4.09*  CALCIUM 8.0*  < > 7.8*  AST 21  --   --  ALT 9*  --   --   ALKPHOS 192*  --   --   BILITOT 1.0  --   --   < > = values in this interval not displayed. ------------------------------------------------------------------------------------------------------------------  Cardiac Enzymes  Recent Labs Lab 08/02/15 0947  TROPONINI 0.03   ------------------------------------------------------------   Rad results:  See  EMR    Thank you for the consultation and for allowing [facility] to assist in the care of your patient. Our recommendations are noted above. Please contact us if we can be of further service.   Marda Stalker, MD.  Board Certified in Internal Medicine, Pulmonary Medicine, [place], and Sleep Medicine.  [facility] Office Number: [phone]  Patricia Pesa, M.D.  Vilinda Boehringer, M.D.  Cheral Marker, M.D

## 2015-08-05 NOTE — Op Note (Signed)
OPERATIVE NOTE   PROCEDURE: 1. Insertion of triple-lumen central venous catheter right IJ approach.  PRE-OPERATIVE DIAGNOSIS: COPD exacerbation, pneumonia  POST-OPERATIVE DIAGNOSIS: Same  SURGEON: Katha Cabal M.D.  ANESTHESIA: 1% lidocaine local infiltration  ESTIMATED BLOOD LOSS: Minimal cc  INDICATIONS:   [name] is a 81 y.o. female who presents with COPD exacerbation and pneumonia. She requires parenteral medications and has undergone multiple attempts at IV access which were unsuccessful. She is therefore undergoing placement central line. Risks and benefits were reviewed patient has agreed to proceed.  DESCRIPTION: After obtaining full informed written consent, the patient was positioned supine. The right neck was prepped and draped in a sterile fashion. Ultrasound was placed in a sterile sleeve. Ultrasound was utilized to identify the right internal jugular vein which is noted to be echolucent and compressible indicating patency. Images recorded for the permanent record. Under real-time visualization a Seldinger needle is inserted into the vein and the guidewires advanced without difficulty. Small counterincision was made at the wire insertion site. Dilators passed over the wire and the triple-lumen catheter is fed without difficulty.  All 3 lm aspirate and flush easily and are packed with heparin saline. Catheter secured to the skin of the right neck with 2-0 silk. A sterile dressing is applied with Biopatch.  COMPLICATIONS: None  CONDITION: Unchanged  Katha Cabal, M.D. [facility]. Office:  [phone]   08/05/2015, 7:06 PM

## 2015-08-05 NOTE — Progress Notes (Signed)
Pt is alert and oriented no complaints durning night. A fib on Cm. sao2 100%. Lung sound coarse. Resting comfortably inbed without any distress. Will continue to  Observe closely.

## 2015-08-05 NOTE — Progress Notes (Signed)
Patient ID: [name], female   DOB: 1933/12/24, 81 y.o.   MRN: [id number] [name] is a 81 y.o. female   SUBJECTIVE:  Patient admitted with progressive cough, dyspnea, tachycardia and hypotension. Currently off IV pressors. Coughing up yellow/green sputum, heart rate controlled. Post thoracentesis, dyspnea slightly better  ______________________________________________________________________  ROS: Review of systems is unremarkable for any active cardiac,respiratory, GI, GU, hematologic, neurologic or psychiatric systems, 10 systems reviewed.  Marland Kitchen amiodarone  100 mg Oral Daily  . cefTRIAXone (ROCEPHIN)  IV  1 g Intravenous Q24H  . Chlorhexidine Gluconate Cloth  6 each Topical Q0600  . clindamycin (CLEOCIN) IV  600 mg Intravenous 3 times per day  . dextromethorphan  30 mg Oral BID   And  . guaiFENesin  600 mg Oral BID  . heparin  5,000 Units Subcutaneous 3 times per day  . insulin aspart  0-5 Units Subcutaneous QHS  . insulin aspart  0-9 Units Subcutaneous TID WC  . ipratropium-albuterol  3 mL Nebulization Q4H  . mupirocin ointment  1 application Nasal BID  . propranolol  80 mg Oral BID  . vancomycin  750 mg Intravenous Q M,W,F-HD   sodium chloride, sodium chloride, acetaminophen **OR** acetaminophen, alteplase, benzonatate, docusate sodium, feeding supplement (NEPRO CARB STEADY), heparin, lidocaine (PF), lidocaine-prilocaine, ondansetron (ZOFRAN) IV, pentafluoroprop-tetrafluoroeth, senna   Past Medical History  Diagnosis Date  . Skin cancer     Resected from legs  . Renal insufficiency     Patient is on dialysis and normal days are M,W and F.  . Diabetes mellitus without complication     Patient takes Insulin  . ESRD (end stage renal disease)     Monday, wednesday, Friday DIalysis  . HTN (hypertension)   . Essential tremor   . OSA (obstructive sleep apnea)   . A-fib     not on anticoagulation  . Bilateral lower extremity edema   . Osteoarthritis     Past  Surgical History  Procedure Laterality Date  . Abdominal hysterectomy    . Cholecystectomy    . Femur fracture surgery      left femur  . Tubal ligation    . Ankle fracture surgery      right ankle  . Neuroplasty / transposition median nerve at carpal tunnel bilateral    . Knee arthroplasty      PHYSICAL EXAM:  BP 97/55 mmHg  Pulse 96  Temp(Src) 97.4 F (36.3 C) (Oral)  Resp 20  Ht 5\' 1"  (1.549 m)  Wt 101 kg (222 lb 10.6 oz)  BMI 42.09 kg/m2  SpO2 96%  Wt Readings from Last 3 Encounters:  08/04/15 101 kg (222 lb 10.6 oz)           BP Readings from Last 3 Encounters:  08/05/15 97/55    Constitutional: NAD Neck: supple, no thyromegaly Respiratory: Reduced breath sounds on the right Cardiovascular: RRR, no murmur, no gallop Abdomen: soft, good BS, nontender Extremities: no edema Neuro: alert and oriented, no focal motor or sensory deficits  ASSESSMENT/PLAN:  Labs and imaging studies were reviewed  Progressive complicated pneumonia-is been on Levaquin several times, on Rocephin/clindamycin/vancomycin, sputum culture pending, likely staph Right effusion-post thoracentesis, cultures pending Hypotension-resolved, off of IV pressors Diabetes-sliding scale ESRD-dialysis Monday Wednesday Friday OSA-continue CPAP at bedtime Transfer to floor today, will need telemetry, echo pending to look at mitral valve

## 2015-08-05 NOTE — Consult Note (Signed)
[facility] Vascular Consult Note  MRN : [id number]  [name] is a 81 y.o. (21-Aug-1934) female who presents with chief complaint of  Chief Complaint  Patient presents with  . Weakness  . Emesis  .  History of Present Illness: Patient admitted with respiratory insufficiency.  Better after thoracentesis and treatment for pneumonia.  Being transferred to the floor today.   She was on the schedule as an outpatient for a fistulagram due to low flows and poor clearance as an outpatient.  Getting dialysis through her fistula still here.  Has no pain or signs of steal syndrome.  Her fistula has been her access for years now.  We are asked about moving up her fistulagram to do while she is in the hospital.  Current Facility-Administered Medications  Medication Dose Route Frequency Provider Last Rate Last Dose  . 0.9 %  sodium chloride infusion  100 mL Intravenous PRN Munsoor Lateef, MD      . 0.9 %  sodium chloride infusion  100 mL Intravenous PRN Munsoor Lateef, MD      . acetaminophen (TYLENOL) tablet 650 mg  650 mg Oral Q6H PRN Gladstone Lighter, MD   650 mg at 08/02/15 2030   Or  . acetaminophen (TYLENOL) suppository 650 mg  650 mg Rectal Q6H PRN Gladstone Lighter, MD      . alteplase (CATHFLO ACTIVASE) injection 2 mg  2 mg Intracatheter Once PRN Munsoor Lateef, MD      . amiodarone (PACERONE) tablet 100 mg  100 mg Oral Daily Isaias Cowman, MD   100 mg at 08/05/15 0946  . benzonatate (TESSALON) capsule 100 mg  100 mg Oral TID PRN Glendon Axe, MD   100 mg at 08/04/15 0406  . calcium acetate (PHOSLO) capsule 1,334 mg  1,334 mg Oral TID WC Murlean Iba, MD   1,334 mg at 08/05/15 1245  . cefTRIAXone (ROCEPHIN) 1 g in dextrose 5 % 50 mL IVPB  1 g Intravenous Q24H Rusty Aus, MD   1 g at 08/05/15 0850  . Chlorhexidine Gluconate Cloth 2 % PADS 6 each  6 each Topical Q0600 Glendon Axe, MD   6 each at 08/04/15 [phone]  . clindamycin (CLEOCIN) IVPB 600 mg  600  mg Intravenous 3 times per day Glendon Axe, MD   600 mg at 08/05/15 0543  . dextromethorphan (DELSYM) 30 MG/5ML liquid 30 mg  30 mg Oral BID Laverle Hobby, MD   30 mg at 08/05/15 3244   And  . guaiFENesin (MUCINEX) 12 hr tablet 600 mg  600 mg Oral BID Laverle Hobby, MD   600 mg at 08/05/15 0946  . docusate sodium (COLACE) capsule 100 mg  100 mg Oral BID PRN Gladstone Lighter, MD      . feeding supplement (NEPRO CARB STEADY) liquid 237 mL  237 mL Oral PRN Munsoor Lateef, MD      . heparin injection 1,000 Units  1,000 Units Dialysis PRN Munsoor Lateef, MD      . heparin injection 5,000 Units  5,000 Units Subcutaneous 3 times per day Gladstone Lighter, MD   5,000 Units at 08/05/15 0543  . insulin aspart (novoLOG) injection 0-5 Units  0-5 Units Subcutaneous QHS Gladstone Lighter, MD   3 Units at 08/02/15 2225  . insulin aspart (novoLOG) injection 0-9 Units  0-9 Units Subcutaneous TID WC Gladstone Lighter, MD   2 Units at 08/05/15 1245  . ipratropium-albuterol (DUONEB) 0.5-2.5 (3) MG/3ML nebulizer solution 3 mL  3 mL Nebulization Q4H Gladstone Lighter, MD   3 mL at 08/05/15 1215  . lidocaine (PF) (XYLOCAINE) 1 % injection 5 mL  5 mL Intradermal PRN Munsoor Lateef, MD      . lidocaine-prilocaine (EMLA) cream 1 application  1 application Topical PRN Munsoor Lateef, MD      . mupirocin ointment (BACTROBAN) 2 % 1 application  1 application Nasal BID Glendon Axe, MD   1 application at 33/29/51 [phone]  . ondansetron (ZOFRAN) injection 4 mg  4 mg Intravenous Q6H PRN Gladstone Lighter, MD      . pentafluoroprop-tetrafluoroeth (GEBAUERS) aerosol 1 application  1 application Topical PRN Munsoor Lateef, MD      . senna (SENOKOT) tablet 8.6 mg  1 tablet Oral Daily PRN Gladstone Lighter, MD      . sevelamer carbonate (RENVELA) tablet 1,600 mg  1,600 mg Oral TID WC Murlean Iba, MD   1,600 mg at 08/05/15 1245  . vancomycin (VANCOCIN) IVPB 750 mg/150 ml premix  750 mg Intravenous Q M,W,F-HD Glendon Axe, MD   750 mg at 08/04/15 1330    Past Medical History  Diagnosis Date  . Skin cancer     Resected from legs  . Renal insufficiency     Patient is on dialysis and normal days are M,W and F.  . Diabetes mellitus without complication     Patient takes Insulin  . ESRD (end stage renal disease)     Monday, wednesday, Friday DIalysis  . HTN (hypertension)   . Essential tremor   . OSA (obstructive sleep apnea)   . A-fib     not on anticoagulation  . Bilateral lower extremity edema   . Osteoarthritis     Past Surgical History  Procedure Laterality Date  . Abdominal hysterectomy    . Cholecystectomy    . Femur fracture surgery      left femur  . Tubal ligation    . Ankle fracture surgery      right ankle  . Neuroplasty / transposition median nerve at carpal tunnel bilateral    . Knee arthroplasty      Social History Social History  Substance Use Topics  . Smoking status: Never Smoker   . Smokeless tobacco: Never Used  . Alcohol Use: No  Married, lives with spouse  Family History Family History  Problem Relation Age of Onset  . Diabetes type II Father   No bleeding disorders, clotting disorders, or autoimmune diseases. Has a brother on dialysis as well  Allergies  Allergen Reactions  . Angiotensin Receptor Blockers     Other reaction(s): Other (See Comments) hyperkalemia  . Penicillins Rash     REVIEW OF SYSTEMS (Negative unless checked)  Constitutional: [] Weight loss  [] Fever  [] Chills Cardiac: [] Chest pain   [] Chest pressure   [x] Palpitations   [x] Shortness of breath when laying flat   [] Shortness of breath at rest   [] Shortness of breath with exertion. Vascular:  [] Pain in legs with walking   [] Pain in legs at rest   [] Pain in legs when laying flat   [] Claudication   [] Pain in feet when walking  [] Pain in feet at rest  [] Pain in feet when laying flat   [] History of DVT   [] Phlebitis   [x] Swelling in legs   [] Varicose veins   [] Non-healing  ulcers Pulmonary:   [] Uses home oxygen   [x] Productive cough   [] Hemoptysis   [x] Wheeze  [] COPD   [] Asthma Neurologic:  [] Dizziness  [] Blackouts   []   Seizures   [] History of stroke   [] History of TIA  [] Aphasia   [] Temporary blindness   [] Dysphagia   [] Weakness or numbness in arms   [] Weakness or numbness in legs Musculoskeletal:  [] Arthritis   [] Joint swelling   [] Joint pain   [] Low back pain Hematologic:  [] Easy bruising  [] Easy bleeding   [] Hypercoagulable state   [] Anemic  [] Hepatitis Gastrointestinal:  [] Blood in stool   [] Vomiting blood  [] Gastroesophageal reflux/heartburn   [] Difficulty swallowing. Genitourinary:  [x] Chronic kidney disease   [] Difficult urination  [] Frequent urination  [] Burning with urination   [] Blood in urine Skin:  [] Rashes   [] Ulcers   [] Wounds Psychological:  [] History of anxiety   []  History of major depression.  Physical Examination  Filed Vitals:   08/05/15 0900 08/05/15 1100 08/05/15 1214 08/05/15 1429  BP: 97/72 114/61 111/45   Pulse: 91  83 83  Temp: 98.2 F (36.8 C)  97.9 F (36.6 C)   TempSrc: Oral  Oral   Resp: 18 16    Height:      Weight:      SpO2: 100%  100% 96%   Body mass index is 42.09 kg/(m^2). Gen:  WD/WN, obese Head: [place]/AT, No temporalis wasting. Prominent temp pulse not noted. Ear/Nose/Throat: Hearing grossly intact, nares w/o erythema or drainage, oropharynx w/o Erythema/Exudate Eyes: PERRLA, EOMI.  Neck: Supple, no nuchal rigidity.  No bruit or JVD.  Pulmonary:  Coarse BS bilaterally and decreased bilaterally.  On high flow [place] this am with good sats Cardiac: irregular, no murmur Vascular: aneurysmal left radiocephalic AVF with thrill and bruit present  Vessel Right Left  Radial Palpable Palpable                                   Gastrointestinal: soft, non-tender/non-distended. No guarding/reflex. No masses, surgical incisions, or scars. Musculoskeletal: M/S 5/5 throughout.  Extremities without ischemic changes.  No  deformity or atrophy. No edema. Neurologic: CN 2-12 intact. Pain and light touch intact in extremities.  Symmetrical.  Speech is fluent. Motor exam as listed above. Psychiatric: Judgment intact, Mood & affect appropriate for pt's clinical situation. Dermatologic: No rashes or ulcers noted.  No cellulitis or open wounds. Lymph : No Cervical, Axillary, or Inguinal lymphadenopathy.    CBC Lab Results  Component Value Date   WBC 15.2* 08/05/2015   HGB 10.3* 08/05/2015   HCT 31.2* 08/05/2015   MCV 104.1* 08/05/2015   PLT 181 08/05/2015    BMET    Component Value Date/Time   NA 138 08/05/2015 0822   NA 130* 03/10/2015 0426   K 4.8 08/05/2015 0822   K 5.3* 03/10/2015 0811   CL 97* 08/05/2015 0822   CL 91* 03/10/2015 0426   CO2 31 08/05/2015 0822   CO2 26 03/10/2015 0426   GLUCOSE 163* 08/05/2015 0822   GLUCOSE 85 03/10/2015 0426   BUN 18 08/05/2015 0822   BUN 41* 03/10/2015 0426   CREATININE 4.09* 08/05/2015 0822   CREATININE 6.65* 03/10/2015 0426   CALCIUM 7.8* 08/05/2015 0822   CALCIUM 7.6* 03/10/2015 0426   GFRNONAA 10* 08/05/2015 0822   GFRNONAA 5* 03/10/2015 0426   GFRAA 11* 08/05/2015 0822   GFRAA 6* 03/10/2015 0426   Estimated Creatinine Clearance: 12.2 mL/min (by C-G formula based on Cr of 4.09).  COAG Lab Results  Component Value Date   INR 1.16 08/04/2015   INR 1.2 03/06/2015   INR 1.1  03/05/2015    Radiology Dg Chest 2 View  08/03/2015   CLINICAL DATA:  RIGHT pleural effusion.  EXAM: CHEST  2 VIEW  COMPARISON:  08/03/2015.  FINDINGS: Moderate to large RIGHT pleural effusion is present with compressive atelectasis. Cardiopericardial silhouette partially obscured. The cardiopericardial silhouette does appear enlarged. Monitoring leads project over the chest. No LEFT pleural effusion.  IMPRESSION: Moderate to large RIGHT pleural effusion.   Electronically Signed   By: Dereck Ligas M.D.   On: 08/03/2015 15:49   Ct Chest W Contrast  07/24/2015   CLINICAL  DATA:  81 year old female with cough since March 2016, worsening over the past 2 weeks, now productive of yellow and yellowish green sputum. Shortness of breath on exertion.  EXAM: CT CHEST, ABDOMEN, AND PELVIS WITH CONTRAST  TECHNIQUE: Multidetector CT imaging of the chest, abdomen and pelvis was performed following the standard protocol during bolus administration of intravenous contrast.  CONTRAST:  169mL OMNIPAQUE IOHEXOL 300 MG/ML  SOLN  COMPARISON:  CT the abdomen and pelvis 03/04/2015. CT of the chest 09/16/2013.  FINDINGS: CT CHEST FINDINGS  Mediastinum/Lymph Nodes: Heart size is enlarged with biatrial dilatation. There is no significant pericardial fluid, thickening or pericardial calcification. Severe tooth thickening calcification of the mitral valve and mitral annulus. There is atherosclerosis of the thoracic aorta, the great vessels of the mediastinum and the coronary arteries, including calcified atherosclerotic plaque in the left main and right coronary arteries. Numerous prominent borderline enlarged mediastinal and bilateral hilar lymph nodes, likely reactive. Esophagus is unremarkable in appearance. No axillary lymphadenopathy.  Lungs/Pleura: Extensive volume loss and airspace consolidation in the lower lobes of the lungs bilaterally. Many of the lower lobe bronchi appear filled with fluid, presumably retained secretions and/or aspirated material. Small bilateral pleural effusions (right greater than left) layering dependently. There is also some patchy peribronchovascular ground-glass attenuation and micronodularity in the dependent portion of the right upper lobe. No other larger more suspicious appearing pulmonary nodules or masses are identified.  Musculoskeletal/Soft Tissues: There are no aggressive appearing lytic or blastic lesions noted in the visualized portions of the skeleton.  CT ABDOMEN AND PELVIS FINDINGS  Hepatobiliary: The liver has a slightly shrunken appearance and mildly nodular  contour, suggestive of underlying cirrhosis. No discrete cystic or solid hepatic lesion. No intra or extrahepatic biliary ductal dilatation. Gallbladder is not visualized, presumably surgically absent (no surgical clips are noted in the gallbladder fossa).  Pancreas: No pancreatic mass. No pancreatic ductal dilatation. No pancreatic or peripancreatic fluid or inflammatory changes.  Spleen: Unremarkable.  Adrenals/Urinary Tract: Multiple small calcifications are noted within the collecting systems of the kidneys bilaterally, likely a combination of vascular calcifications and nonobstructive calculi. The largest nonobstructive calculus is in the interpolar region of the left kidney measuring 3 mm. Multiple sub cm low-attenuation lesions in the kidneys bilaterally too small to definitively characterize, but are favored to represent tiny cysts. 1.9 cm low-attenuation lesion in the interpolar region of the left kidney is compatible with a simple cyst. Bilateral adrenal glands are normal in appearance. No hydroureteronephrosis. Urinary bladder is normal in appearance.  Stomach/Bowel: The appearance of the stomach is normal. No pathologic dilatation of small bowel or colon.  Vascular/Lymphatic: Atherosclerosis throughout the abdominal and pelvic vasculature, without evidence of aneurysm or dissection. No lymphadenopathy noted in the abdomen or pelvis.  Reproductive: Status post hysterectomy. Ovaries are not confidently identified may be surgically absent or atrophic.  Other: Tiny umbilical hernia containing only omental fat incidentally noted. No significant volume of  ascites. No pneumoperitoneum.  Musculoskeletal: Chronic compression fracture of L4 with approximately 30% loss of anterior vertebral body height is unchanged. Cavernous hemangioma and L1 vertebral body again incidentally noted. Old healed fractures of the left superior and inferior pubic rami are incidentally noted. There are no aggressive appearing lytic or  blastic lesions noted in the visualized portions of the skeleton.  IMPRESSION: 1. Extensive volume loss and airspace consolidation in the lower lobes of the lungs bilaterally, and to a lesser extent in the posterior aspect of the right upper lobe. Given the patient's chronic symptoms, these findings are concerning for recurrent aspiration with associated aspiration pneumonia, and further evaluation by speech therapy to assess for aspiration is recommended in the near future. 2. Small bilateral pleural effusions layering dependently (right greater than left). 3. No acute findings in the abdomen or pelvis. 4. Atherosclerosis, including left main and right coronary artery disease. Assessment for potential risk factor modification, dietary therapy or pharmacologic therapy may be warranted, if clinically indicated. 5. The appearance of the liver suggests underlying cirrhosis. 6. Cardiomegaly with biatrial dilatation. 7. There are calcifications of the mitral valve and annulus. Echocardiographic correlation for evaluation of potential valvular dysfunction may be warranted if clinically indicated. 8. Additional incidental findings, as above.   Electronically Signed   By: Vinnie Langton M.D.   On: 07/24/2015 17:29   Dg Chest Right Decubitus  08/03/2015   CLINICAL DATA:  RIGHT pleural effusion. RIGHT side down decubitus radiographs.  EXAM: CHEST - RIGHT DECUBITUS  COMPARISON:  08/03/2015.  FINDINGS: RIGHT pleural effusion is large. Majority of it fusion layers dependently with decubitus positioning. Compressive atelectasis of the RIGHT lung associated with the effusion.  IMPRESSION: Large dependently layering RIGHT pleural effusion.   Electronically Signed   By: Dereck Ligas M.D.   On: 08/03/2015 15:47   Ct Abdomen Pelvis W Contrast  07/24/2015   CLINICAL DATA:  81 year old female with cough since March 2016, worsening over the past 2 weeks, now productive of yellow and yellowish green sputum. Shortness of breath  on exertion.  EXAM: CT CHEST, ABDOMEN, AND PELVIS WITH CONTRAST  TECHNIQUE: Multidetector CT imaging of the chest, abdomen and pelvis was performed following the standard protocol during bolus administration of intravenous contrast.  CONTRAST:  162mL OMNIPAQUE IOHEXOL 300 MG/ML  SOLN  COMPARISON:  CT the abdomen and pelvis 03/04/2015. CT of the chest 09/16/2013.  FINDINGS: CT CHEST FINDINGS  Mediastinum/Lymph Nodes: Heart size is enlarged with biatrial dilatation. There is no significant pericardial fluid, thickening or pericardial calcification. Severe tooth thickening calcification of the mitral valve and mitral annulus. There is atherosclerosis of the thoracic aorta, the great vessels of the mediastinum and the coronary arteries, including calcified atherosclerotic plaque in the left main and right coronary arteries. Numerous prominent borderline enlarged mediastinal and bilateral hilar lymph nodes, likely reactive. Esophagus is unremarkable in appearance. No axillary lymphadenopathy.  Lungs/Pleura: Extensive volume loss and airspace consolidation in the lower lobes of the lungs bilaterally. Many of the lower lobe bronchi appear filled with fluid, presumably retained secretions and/or aspirated material. Small bilateral pleural effusions (right greater than left) layering dependently. There is also some patchy peribronchovascular ground-glass attenuation and micronodularity in the dependent portion of the right upper lobe. No other larger more suspicious appearing pulmonary nodules or masses are identified.  Musculoskeletal/Soft Tissues: There are no aggressive appearing lytic or blastic lesions noted in the visualized portions of the skeleton.  CT ABDOMEN AND PELVIS FINDINGS  Hepatobiliary: The liver has a  slightly shrunken appearance and mildly nodular contour, suggestive of underlying cirrhosis. No discrete cystic or solid hepatic lesion. No intra or extrahepatic biliary ductal dilatation. Gallbladder is not  visualized, presumably surgically absent (no surgical clips are noted in the gallbladder fossa).  Pancreas: No pancreatic mass. No pancreatic ductal dilatation. No pancreatic or peripancreatic fluid or inflammatory changes.  Spleen: Unremarkable.  Adrenals/Urinary Tract: Multiple small calcifications are noted within the collecting systems of the kidneys bilaterally, likely a combination of vascular calcifications and nonobstructive calculi. The largest nonobstructive calculus is in the interpolar region of the left kidney measuring 3 mm. Multiple sub cm low-attenuation lesions in the kidneys bilaterally too small to definitively characterize, but are favored to represent tiny cysts. 1.9 cm low-attenuation lesion in the interpolar region of the left kidney is compatible with a simple cyst. Bilateral adrenal glands are normal in appearance. No hydroureteronephrosis. Urinary bladder is normal in appearance.  Stomach/Bowel: The appearance of the stomach is normal. No pathologic dilatation of small bowel or colon.  Vascular/Lymphatic: Atherosclerosis throughout the abdominal and pelvic vasculature, without evidence of aneurysm or dissection. No lymphadenopathy noted in the abdomen or pelvis.  Reproductive: Status post hysterectomy. Ovaries are not confidently identified may be surgically absent or atrophic.  Other: Tiny umbilical hernia containing only omental fat incidentally noted. No significant volume of ascites. No pneumoperitoneum.  Musculoskeletal: Chronic compression fracture of L4 with approximately 30% loss of anterior vertebral body height is unchanged. Cavernous hemangioma and L1 vertebral body again incidentally noted. Old healed fractures of the left superior and inferior pubic rami are incidentally noted. There are no aggressive appearing lytic or blastic lesions noted in the visualized portions of the skeleton.  IMPRESSION: 1. Extensive volume loss and airspace consolidation in the lower lobes of the  lungs bilaterally, and to a lesser extent in the posterior aspect of the right upper lobe. Given the patient's chronic symptoms, these findings are concerning for recurrent aspiration with associated aspiration pneumonia, and further evaluation by speech therapy to assess for aspiration is recommended in the near future. 2. Small bilateral pleural effusions layering dependently (right greater than left). 3. No acute findings in the abdomen or pelvis. 4. Atherosclerosis, including left main and right coronary artery disease. Assessment for potential risk factor modification, dietary therapy or pharmacologic therapy may be warranted, if clinically indicated. 5. The appearance of the liver suggests underlying cirrhosis. 6. Cardiomegaly with biatrial dilatation. 7. There are calcifications of the mitral valve and annulus. Echocardiographic correlation for evaluation of potential valvular dysfunction may be warranted if clinically indicated. 8. Additional incidental findings, as above.   Electronically Signed   By: Vinnie Langton M.D.   On: 07/24/2015 17:29   Dg Chest Port 1 View  08/04/2015   CLINICAL DATA:  Post right-sided thoracentesis  EXAM: PORTABLE CHEST - 1 VIEW  COMPARISON:  08/03/2015 ; 08/02/2015 ; chest CT - 07/24/2015  FINDINGS: Interval reduction in persistent small partially loculated right-sided effusion post thoracentesis. No pneumothorax. Unchanged trace left-sided effusion. Improved aeration of the right lung with persistent bibasilar heterogeneous/consolidative opacities. No new focal airspace opacities. No evidence of edema. No acute osseus abnormalities.  IMPRESSION: 1. Interval reduction in persistent small right-sided effusion post thoracentesis. No pneumothorax. 2. Unchanged trace left-sided pleural effusion. 3. Improved aeration of the right lower lung with persistent bibasilar heterogeneous/consolidative opacities, left greater than right, atelectasis versus infiltrate.   Electronically  Signed   By: Sandi Mariscal M.D.   On: 08/04/2015 16:02   Dg Chest Portable  1 View  08/02/2015   CLINICAL DATA:  Cough.  Vomiting for 2 weeks.  Weakness.  EXAM: PORTABLE CHEST - 1 VIEW  COMPARISON:  07/24/2015 CT.  FINDINGS: Cardiopericardial silhouette is upper limits of normal for projection. RIGHT-greater-than-LEFT pleural effusion with bilateral RIGHT-greater-than-LEFT collapse/consolidation at the bases. Comparing to the prior CT, there is little if any interval change. Aortic arch atherosclerosis.  IMPRESSION: Unchanged bilateral RIGHT-greater-than- LEFT pleural effusions and collapse/consolidation.   Electronically Signed   By: Dereck Ligas M.D.   On: 08/02/2015 10:13   US Thoracentesis Asp Pleural Space W/img Guide  08/04/2015   INDICATION: Symptomatic right sided pleural effusion - please perform ultrasound-guided thoracentesis for diagnostic and therapeutic purposes.  EXAM: US THORACENTESIS ASP PLEURAL SPACE W/IMG GUIDE  COMPARISON:  Chest radiograph - 08/03/2015  MEDICATIONS: None  COMPLICATIONS: None immediate  TECHNIQUE: Informed written consent was obtained from the patient after a discussion of the risks, benefits and alternatives to treatment. A timeout was performed prior to the initiation of the procedure.  Initial ultrasound scanning demonstrates a small to moderate sized right-sided pleural effusion. The lower chest was prepped and draped in the usual sterile fashion. 1% lidocaine was used for local anesthesia.  Under direct ultrasound guidance, a 19 gauge, 7-cm, Yueh catheter was introduced. An ultrasound image was saved for documentation purposes. The thoracentesis was performed. The catheter was removed and a dressing was applied. The patient tolerated the procedure well without immediate post procedural complication. The patient was escorted to have an upright chest radiograph.  FINDINGS: A total of approximately 1 liter of serous fluid was removed. Requested samples were sent to the  laboratory.  IMPRESSION: Successful ultrasound-guided right sided thoracentesis yielding 1 liter of pleural fluid.   Electronically Signed   By: Sandi Mariscal M.D.   On: 08/04/2015 16:07      Assessment/Plan 1. ESRD: gets dialysis on M/W/F.  Function has been poor 2. Dysfunction of dialysis access.  Scheduled for a fistulagram as an outpatient.  We can move this up to tomorrow to do while she is here if that is helpful, and Nephrology would like to have that done.  Risks and benefits discussed and she is agreeable to proceed. 3. Pneumonia: cont abx 4. Respiratory status is improved.  Needs to be stable to do procedure tomorrow.  Valleri Hendricksen, MD  08/05/2015 2:32 PM

## 2015-08-05 NOTE — Evaluation (Addendum)
Physical Therapy Evaluation Patient Details Name: [name] MRN: [id number] DOB: [birth year] Today's Date: 08/05/2015   History of Present Illness  Pt admitted to hospital with SOB/gross weakness/emesis, diagnosed as sepsis secondary to pneumonial bronchitis.  Pt presents with hypotensive symptoms that need to be closely mointored.     Clinical Impression  Pt was able to complete supine therex with no difficulty and has no complaints of dizziness but is limited by orthostatics. With first attempt of sitting, pt's BP dropped to 76/61 so PT lied the pt back down.  Pt was able to sit at EOB with min assist on second attempt and was able to complete seated therex, BP remained stable.  Pt's gross LE strength was measured at 3+/5 with exception to knee extension (4/5) and stood up with min assist using a RW for ~1 min but was limited by orthostatics again and pt reports her inability to tolerate standing.  Pt would continue to benefit from skilled PT in order to improve orthostatics/cardiorespiratory endurance, improve with transfers, and increase gross strength.      Follow Up Recommendations SNF    Equipment Recommendations  Rolling walker with 5" wheels    Recommendations for Other Services       Precautions / Restrictions Precautions Precautions: Fall Restrictions Weight Bearing Restrictions: No      Mobility  Bed Mobility Overal bed mobility: Needs Assistance Bed Mobility: Supine to Sit     Supine to sit: Min assist     General bed mobility comments: Pt requires heavy UE compensation on bedrails and is unable to complete trunk flex without min assist tactil facilitation. On first attempt in sitting, pt BP dropped to 76/61 so PT returned her to supine. On second attempt, pt increased to 120/65.    Transfers Overall transfer level: Needs assistance Equipment used: Rolling walker (2 wheeled) Transfers: Sit to/from Stand Sit to Stand: Min assist         General transfer  comment: Pt was able to stand with min assist but is limited by orthostatics (BP dropped to 110/61 and O2 dropped to 83%), pt was only able to stand for ~1 min to have BP measured.   Ambulation/Gait                Stairs            Wheelchair Mobility    Modified Rankin (Stroke Patients Only)       Balance Overall balance assessment: Needs assistance Sitting-balance support: Single extremity supported;No upper extremity supported Sitting balance-Leahy Scale: Good Sitting balance - Comments: Once orthostatics were clear, pt was able to complete LAQs and hip marches in unsupported sitting with no UE support.   Standing balance support: Bilateral upper extremity supported Standing balance-Leahy Scale: Poor Standing balance comment: PT demonstrated heavy UE sompensation/trunk flexion on the RW and requires min assist support, with verbal cuing pt is able to correct posture.  Pt was only able to stand for ~71min and was limited by orthostatics.                             Pertinent Vitals/Pain Pain Assessment: No/denies pain    Home Living Family/patient expects to be discharged to:: Private residence Living Arrangements: Spouse/significant other Available Help at Discharge: Family Type of Home: House Home Access: Stairs to enter Entrance Stairs-Rails: None Entrance Stairs-Number of Steps: 2 Home Layout: One level Home Equipment: Environmental consultant - 2 wheels  Prior Function Level of Independence: Independent with assistive device(s)         Comments: Pt was able to ambulate and transfer with RW prior to admission.     Hand Dominance        Extremity/Trunk Assessment   Upper Extremity Assessment: Overall WFL for tasks assessed           Lower Extremity Assessment: Overall WFL for tasks assessed;Generalized weakness (Pt presents with 4/5 bilat knee extension, bilat ankle DF/hip flex/knee flex measured at 3+/5)         Communication    Communication: No difficulties  Cognition Arousal/Alertness: Awake/alert Behavior During Therapy: WFL for tasks assessed/performed Overall Cognitive Status: Within Functional Limits for tasks assessed                      General Comments      Exercises Other Exercises Other Exercises: Supine bilat hip abd/heel slides/quad sets/ankle pumps, 1 x 10. Sitting at unsupported EOB hip marches/LAQs/resisted hip abd with shoulder flex, 1 x 10. (16 min)      Assessment/Plan    PT Assessment Patient needs continued PT services  PT Diagnosis Difficulty walking;Generalized weakness   PT Problem List Decreased strength;Decreased activity tolerance;Decreased balance;Decreased mobility;Decreased coordination;Decreased knowledge of use of DME;Decreased safety awareness;Cardiopulmonary status limiting activity  PT Treatment Interventions DME instruction;Gait training;Stair training;Functional mobility training;Therapeutic activities;Therapeutic exercise;Balance training   PT Goals (Current goals can be found in the Care Plan section) Acute Rehab PT Goals Patient Stated Goal: to return home PT Goal Formulation: With patient/family Time For Goal Achievement: 08/19/15 Potential to Achieve Goals: Good    Frequency Min 2X/week   Barriers to discharge Inaccessible home environment 2 steps to enter    Co-evaluation               End of Session Equipment Utilized During Treatment: Gait belt;Oxygen Activity Tolerance: Patient tolerated treatment well;Patient limited by fatigue Patient left: in bed;with call bell/phone within reach;with bed alarm set;with family/visitor present           Time: 0146-0219 PT Time Calculation (min) (ACUTE ONLY): 33 min   Charges:         PT G Codes:        Bernestine Amass, SPT 08/05/2015 2:49 PM   Chart review, evaluation and patient treatment completed by Bernestine Amass, SPT.  This patient note, response to treatment and overall treatment  plan has been reviewed and this clinician agrees with the information provided.  Addisynn Vassell H. Owens Shark, PT, DPT, NCS 08/05/2015, 4:42 PM [phone]

## 2015-08-05 NOTE — Progress Notes (Signed)
[facility] [place] [facility] Consultation      Assessment and Plan:81 yo female seen in consultation for respiratory distress, right lower lobe pneumonia, right lower lobe pleural effusion  Pneumonia -Suspected healthcare associated pneumonia. Continue antibiotics including clinda/ceftriaxone/vanc, which appears to be appropriate -Await results of sputum cultures. --> currently with normal flora -s/p thoracentesis on 8/22, results pending -The patient will need outpatient follow-up which will include Tucker testing as well as repeat imaging to ensure resolution of the changes seen on the chest x-ray. Follow up with [name], Dr. Peterson Tucker [phone]) 2-3 weeks after hospital discharge.   Pleural effusion -s/p thoracentesis on 8/22, awaiting results.  - I have asked Dr. Marta Tucker (CTS) to review the CT images to evaluate the possibility of loculated effusion with pleural thickening and subsequent atelectasis, which may portend her to an evaluation for a surgical procedure if IR thoracentesis is inconclusive.   Acute respiratory failure -Secondary to pneumonia and pleural effusion. - incentive spirometry and flutter valve   Cough Productive of green sputum secondary to pneumonia. - incentive spirometry and flutter valve   Atrial fibrillation -With fast heart rate, which is now controlled. Cardiology service is following.   Date: 08/05/2015  MRN# [id number] [name] 1934/07/26  Referring Physician:   Lenice Tucker is a 81 y.o. old female seen in consultation for chief complaint of:    Chief Complaint  Patient presents with  . Weakness  . Emesis    SUBJECTIVE: Awake this morning, states that she is dong much better today, improved breathing.   SIGNIFICANT EVENTS: 8/22>>U/S thoracentesis (right)   HPI:   [name] is a 81 y.o. female with a known history of end-stage renal disease on Monday Wednesday Friday dialysis, hypertension, atrial  fibrillation not on anticoagulation, insulin-dependent diabetes mellitus, arthritis.  The patient presented to the hospital recently due to persistent cough and shortness of breath, which began approximately 3 weeks ago. The symptoms started with mildly increasing dyspnea and a mild cough. Symptoms progressed with worsening dyspnea and increasingly productive cough which is productive of greenish to yellowish sputum. There was no exacerbating or relieving factors. Her breathing would get worse with ambulation and improved with rest. She had no dyspnea while at rest. Her recent history significant for a femoral fracture in March of this year. Her course after that episode was compensated by an episode of pneumonia requiring antibiotics. She has no known history of Tucker disease in the past. She is a lifelong nonsmoker. Her respiratory status was normal before this episode occurred. She does not use any inhaled medications at home. She has no history of chronic cough in the past. Her presentation to hospital was completed by development of atrial fibrillation with rapid ventricular rate, and hypotension for which she was transferred to the intensive care unit last night. Currently, her nurse status is better. Her heart rate is much better controlled. Her blood pressure is now stable. She is eating planned to be transferred to the floor later today, however, she continues to have this severe cough and dyspnea. She is no other particular complaints at this time.  PMHX:   Past Medical History  Diagnosis Date  . Skin cancer     Resected from legs  . Renal insufficiency     Patient is on dialysis and normal days are M,W and F.  . Diabetes mellitus without complication     Patient takes Insulin  . ESRD (end stage renal disease)  Monday, wednesday, Friday DIalysis  . HTN (hypertension)   . Essential tremor   . OSA (obstructive sleep apnea)   . A-fib     not on anticoagulation  . Bilateral lower  extremity edema   . Osteoarthritis    Surgical Hx:  Past Surgical History  Procedure Laterality Date  . Abdominal hysterectomy    . Cholecystectomy    . Femur fracture surgery      left femur  . Tubal ligation    . Ankle fracture surgery      right ankle  . Neuroplasty / transposition median nerve at carpal tunnel bilateral    . Knee arthroplasty     Family Hx:  Family History  Problem Relation Age of Onset  . Diabetes type II Father    Social Hx:   Social History  Substance Use Topics  . Smoking status: Never Smoker   . Smokeless tobacco: Never Used  . Alcohol Use: No   Medication:   No current outpatient prescriptions on file.    Allergies:  Angiotensin receptor blockers and Penicillins  Review of Systems: Gen:  Denies  fever, sweats, chills HEENT: Denies blurred vision, double vision. bleeds, sore throat Cvc:  No dizziness, chest pain. Resp:  Admits to cough and sputum production (thick yellow).  Gi: Denies swallowing difficulty, stomach pain. Gu:  Denies bladder incontinence, burning urine Ext:   No Joint pain, stiffness. Skin: No skin rash,  hives Endoc:  No polyuria, polydipsia. Psych: No depression, insomnia. Other:  All other systems were reviewed with the patient and were negative other that what is mentioned in the HPI.   Physical Examination:   VS: BP 97/72 mmHg  Pulse 91  Temp(Src) 98.2 F (36.8 C) (Oral)  Resp 18  Ht 5\' 1"  (1.549 m)  Wt 222 lb 10.6 oz (101 kg)  BMI 42.09 kg/m2  SpO2 100%  General Appearance: No distress  Neuro:without focal findings,  speech normal,  HEENT: PERRLA, EOM intact.   Tucker: coarse upper airway sounds, dec BS on the RLL, good respiratory effort.   CardiovascularNormal S1,S2.  No m/r/g.   Abdomen: Benign, Soft, non-tender. Renal:  No costovertebral tenderness  GU:  No performed at this time. Endoc: No evident thyromegaly, no signs of acromegaly. Skin:   warm, no rashes, no ecchymosis  Extremities: normal,  no cyanosis, clubbing.    LABORATORY PANEL:   CBC  Recent Labs Lab 08/05/15 0822  WBC 15.2*  HGB 10.3*  HCT 31.2*  PLT 181   ------------------------------------------------------------------------------------------------------------------  Chemistries   Recent Labs Lab 08/02/15 0947  08/05/15 0822  NA 138  < > 138  K 4.0  < > 4.8  CL 93*  < > 97*  CO2 35*  < > 31  GLUCOSE 115*  < > 163*  BUN 14  < > 18  CREATININE 4.63*  < > 4.09*  CALCIUM 8.0*  < > 7.8*  AST 21  --   --   ALT 9*  --   --   ALKPHOS 192*  --   --   BILITOT 1.0  --   --   < > = values in this interval not displayed. ------------------------------------------------------------------------------------------------------------------  Cardiac Enzymes  Recent Labs Lab 08/02/15 0947  TROPONINI 0.03   ------------------------------------------------------------   Rad results:  See EMR    I have personally obtained a history, examined the patient, evaluated laboratory and imaging results, formulated the assessment and plan and placed orders.  The patient is ill  with  and requires high complexity decision making for assessment and support, frequent evaluation and titration of therapies, application of advanced monitoring technologies and extensive interpretation of multiple databases. Tucker consult time devoted to patient care services described in this note is 45 minutes.    Vilinda Boehringer, MD [facility] Pager [phone] (Please enter 7-digits)

## 2015-08-05 NOTE — Progress Notes (Signed)
Initial Nutrition Assessment  DOCUMENTATION CODES:   Severe malnutrition in context of acute illness/injury  INTERVENTION:   Meals and Snacks: Cater to patient preferences Medical Food Supplement Therapy: pt does not like liquid supplements like Nepro or Ensure, reports that make her sick on her stomach. Pt does like sweets at present; agreeable to trying [employer]. Will send TID on meal trays Nutrition related medication management: recommend addition of anti-emetic due to pt report of nausea with vomitting at times which is contributing to poor oral intake; pt takes reglan at home, but indicates this is not helping. Noted pt also takes remeron as outpatient, may benefit from restarting as this may help with appetite.    NUTRITION DIAGNOSIS:   Inadequate oral intake related to nausea, poor appetite as evidenced by meal completion < 25%, percent weight loss, per patient/family report.  GOAL:   Patient will meet greater than or equal to 90% of their needs  MONITOR:    (Energy Intake, Anthropometrics, Digestive System, Electrolyte/Renal Profile)  REASON FOR ASSESSMENT:   Malnutrition Screening Tool (Renal Diet, Dialysis)    ASSESSMENT:    Pt admitted with pneumpnia, right effusion s/p thoracentesis with 1 L removed. Pt with ESRD on HD   Past Medical History  Diagnosis Date  . Skin cancer     Resected from legs  . Renal insufficiency     Patient is on dialysis and normal days are M,W and F.  . Diabetes mellitus without complication     Patient takes Insulin  . ESRD (end stage renal disease)     Monday, wednesday, Friday DIalysis  . HTN (hypertension)   . Essential tremor   . OSA (obstructive sleep apnea)   . A-fib     not on anticoagulation  . Bilateral lower extremity edema   . Osteoarthritis     Past Surgical History  Procedure Laterality Date  . Abdominal hysterectomy    . Cholecystectomy    . Femur fracture surgery      left femur  . Tubal ligation    .  Ankle fracture surgery      right ankle  . Neuroplasty / transposition median nerve at carpal tunnel bilateral    . Knee arthroplasty       Diet Order:  Diet renal with fluid restriction Fluid restriction:: 1200 mL Fluid; Room service appropriate?: Yes; Fluid consistency:: Thin Diet NPO time specified   Energy Intake: recorded po intake 25% of meals on average, pt reports poor appetite. Pt ate jello at breakfast this AM, ate some mashed potatoes and bites of chicken and green beans at lunch today.   Food and Nutrition related history: pt reports poor appetite over the past month with N/V at meal times. Noted pt taking reglan as outpatient, but pt reports this does not help with the N/V. Pt reports the only thing she has an appetite for is sweets, has not been able to tolerate meats.   Skin:  Reviewed, no issues   Nutrition Focused Physical Exam: Nutrition-Focused physical exam completed. Findings are WDL for fat depletion and muscle depletion. Pt with moderate edema.  Glucose Profile:  Recent Labs  08/04/15 2148 08/05/15 0727 08/05/15 1238  GLUCAP 113* 152* 174*   Electrolyte/Renal profile  Recent Labs Lab 08/02/15 0947 08/03/15 0621 08/04/15 1735 08/05/15 0822  NA 138 134*  --  138  K 4.0 4.8  --  4.8  CL 93* 95*  --  97*  CO2 35* 27  --  31  BUN 14 20  --  18  CREATININE 4.63* 5.15*  --  4.09*  CALCIUM 8.0* 7.5*  --  7.8*  PHOS  --   --  7.7*  --   GLUCOSE 115* 165*  --  163*   Protein Profile:  Recent Labs Lab 08/02/15 0947  ALBUMIN 2.6*   Nutritional Anemia Profile:  CBC Latest Ref Rng 08/05/2015 08/03/2015 08/02/2015  WBC 3.6 - 11.0 K/uL 15.2(H) 20.8(H) 15.1(H)  Hemoglobin 12.0 - 16.0 g/dL 10.3(L) 11.3(L) 10.7(L)  Hematocrit 35.0 - 47.0 % 31.2(L) 35.2 32.9(L)  Platelets 150 - 440 K/uL 181 189 278   Meds: renvela, phoslo, ss novolog   Last BM:  8/22  Height:   Ht Readings from Last 1 Encounters:  08/02/15 5\' 1"  (1.549 m)    Weight: pt reports 30  pound wt loss over the past month; 11.9% wt loss in 1 month  Wt Readings from Last 1 Encounters:  08/04/15 222 lb 10.6 oz (101 kg)   No other weight encounters in computer  BMI:  Body mass index is 42.09 kg/(m^2).  Estimated Nutritional Needs:   Kcal:  1502-1775 kcals (BEE 1050, 1.3 AF, 1.1-1.3 IF) using IBW 48 kg  Protein:  58-72 g (1.2-1.5 g/kg)   Fluid:  1000 ml plus UOP   HIGH Care Level  Kerman Passey MS, RD, LDN [phone] Pager

## 2015-08-05 NOTE — Progress Notes (Signed)
*  PRELIMINARY RESULTS* Echocardiogram 2D Echocardiogram has been performed.  [name] Stills 08/05/2015, 4:30 PM

## 2015-08-05 NOTE — Progress Notes (Signed)
Pt tx to 2A per MD order, pt without PIV MD aware, pt VSS, Gen Sx Consulted to place CL, pt and family verbalized understanding of tx, report called to 2A RN, all questions answered, updates given, all questions answered

## 2015-08-05 NOTE — Care Management Note (Signed)
Patient is active at [facility] on MWF schedule.  I have sent admission records to clinic and will update with discharge records when that occurs.   [name] Liaison [phone]

## 2015-08-05 NOTE — Progress Notes (Signed)
Subjective:  Pt feels better this morning  633 cc of fluid was removed with dialysis In addition patient underwent thoracentesis . 1 L of fluid was removed Breathing is better this morning. Less cough. Heart rate is improved   Objective:  Vital signs in last 24 hours:  Temp:  [97.4 F (36.3 C)-98.3 F (36.8 C)] 97.4 F (36.3 C) (08/23 0500) Pulse Rate:  [50-131] 96 (08/23 0600) Resp:  [0-37] 20 (08/23 0600) BP: (73-123)/(38-89) 97/55 mmHg (08/23 0600) SpO2:  [92 %-100 %] 96 % (08/23 0600) Weight:  [101 kg (222 lb 10.6 oz)] 101 kg (222 lb 10.6 oz) (08/22 1715)  Weight change:  Filed Weights   08/02/15 1342 08/03/15 0300 08/04/15 1715  Weight: 92.534 kg (204 lb) 96.8 kg (213 lb 6.5 oz) 101 kg (222 lb 10.6 oz)    Intake/Output: I/O last 3 completed shifts: In: 8786 [P.O.:850; I.V.:500; IV Piggyback:200] Out: -46 [Urine:20; Stool:1]   Intake/Output this shift:     Physical Exam: General: NAD, resting in bed  Head: Normocephalic, atraumatic. Moist oral mucosal membranes  Eyes: Anicteric  Neck: Supple, trachea midline  Lungs:  Bibasilar rales , Rt lower field rhonchi  Heart: Irregular, A Fib,   Abdomen:  Soft, nontender, obese, BS present  Extremities:  1+ peripheral edema.  Neurologic: Nonfocal, moving all four extremities  Skin: No lesions  Access: LUE AVF    Basic Metabolic Panel:  Recent Labs Lab 08/02/15 0947 08/03/15 0621 08/04/15 1735 08/05/15 0822  NA 138 134*  --  138  K 4.0 4.8  --  4.8  CL 93* 95*  --  97*  CO2 35* 27  --  31  GLUCOSE 115* 165*  --  163*  BUN 14 20  --  18  CREATININE 4.63* 5.15*  --  4.09*  CALCIUM 8.0* 7.5*  --  7.8*  PHOS  --   --  7.7*  --     Liver Function Tests:  Recent Labs Lab 08/02/15 0947  AST 21  ALT 9*  ALKPHOS 192*  BILITOT 1.0  PROT 6.9  ALBUMIN 2.6*    Recent Labs Lab 08/02/15 0947  LIPASE 17*   No results for input(s): AMMONIA in the last 168 hours.  CBC:  Recent Labs Lab 08/02/15 0947  08/03/15 0621 08/05/15 0822  WBC 15.1* 20.8* 15.2*  NEUTROABS 11.5*  --  12.1*  HGB 10.7* 11.3* 10.3*  HCT 32.9* 35.2 31.2*  MCV 103.3* 106.8* 104.1*  PLT 278 189 181    Cardiac Enzymes:  Recent Labs Lab 08/02/15 0947  TROPONINI 0.03    BNP: Invalid input(s): POCBNP  CBG:  Recent Labs Lab 08/04/15 0733 08/04/15 1138 08/04/15 1621 08/04/15 2148 08/05/15 0727  GLUCAP 152* 221* 166* 113* 152*    Microbiology: Results for orders placed or performed during the hospital encounter of 08/02/15  Blood culture (routine x 2)     Status: None (Preliminary result)   Collection Time: 08/02/15 11:59 AM  Result Value Ref Range Status   Specimen Description BLOOD LEFT ASSIST CONTROL  Final   Special Requests   Final    BOTTLES DRAWN AEROBIC AND ANAEROBIC  AER 6CC ANA 1CC   Culture NO GROWTH 3 DAYS  Final   Report Status PENDING  Incomplete  Blood culture (routine x 2)     Status: None (Preliminary result)   Collection Time: 08/02/15 12:00 PM  Result Value Ref Range Status   Specimen Description BLOOD LEFT FOREARM  Final  Special Requests BOTTLES DRAWN AEROBIC AND ANAEROBIC  1CC  Final   Culture NO GROWTH 3 DAYS  Final   Report Status PENDING  Incomplete  Culture, expectorated sputum-assessment     Status: None   Collection Time: 08/02/15  8:37 PM  Result Value Ref Range Status   Specimen Description SPUTUM  Final   Special Requests Normal  Final   Sputum evaluation THIS SPECIMEN IS ACCEPTABLE FOR SPUTUM CULTURE  Final   Report Status 08/04/2015 FINAL  Final  MRSA PCR Screening     Status: Abnormal   Collection Time: 08/02/15  8:37 PM  Result Value Ref Range Status   MRSA by PCR POSITIVE (A) NEGATIVE Final    Comment:        The GeneXpert MRSA Assay (FDA approved for NASAL specimens only), is one component of a comprehensive MRSA colonization surveillance program. It is not intended to diagnose MRSA infection nor to guide or monitor treatment for MRSA  infections. CRITICAL RESULT CALLED TO, READ BACK BY AND VERIFIED WITH: MARCEL TURNER AT 2214 08/02/15.PMH   Culture, respiratory (NON-Expectorated)     Status: None (Preliminary result)   Collection Time: 08/02/15  8:37 PM  Result Value Ref Range Status   Specimen Description SPUTUM  Final   Special Requests Normal Reflexed from [id number]  Final   Gram Stain   Final    FAIR SPECIMEN - 70-80% WBCS FEW WBC SEEN MANY GRAM POSITIVE COCCI    Culture HOLDING FOR POSSIBLE PATHOGEN  Final   Report Status PENDING  Incomplete    Coagulation Studies:  Recent Labs  08/04/15 1245  LABPROT 15.0  INR 1.16    Urinalysis:  Recent Labs  08/03/15 1420  COLORURINE AMBER*  LABSPEC 1.015  PHURINE 7.0  GLUCOSEU NEGATIVE  HGBUR 1+*  BILIRUBINUR NEGATIVE  KETONESUR NEGATIVE  PROTEINUR 100*  NITRITE NEGATIVE  LEUKOCYTESUR 2+*      Imaging: Dg Chest 2 View  08/03/2015   CLINICAL DATA:  RIGHT pleural effusion.  EXAM: CHEST  2 VIEW  COMPARISON:  08/03/2015.  FINDINGS: Moderate to large RIGHT pleural effusion is present with compressive atelectasis. Cardiopericardial silhouette partially obscured. The cardiopericardial silhouette does appear enlarged. Monitoring leads project over the chest. No LEFT pleural effusion.  IMPRESSION: Moderate to large RIGHT pleural effusion.   Electronically Signed   By: Dereck Ligas M.D.   On: 08/03/2015 15:49   Dg Chest Right Decubitus  08/03/2015   CLINICAL DATA:  RIGHT pleural effusion. RIGHT side down decubitus radiographs.  EXAM: CHEST - RIGHT DECUBITUS  COMPARISON:  08/03/2015.  FINDINGS: RIGHT pleural effusion is large. Majority of it fusion layers dependently with decubitus positioning. Compressive atelectasis of the RIGHT lung associated with the effusion.  IMPRESSION: Large dependently layering RIGHT pleural effusion.   Electronically Signed   By: Dereck Ligas M.D.   On: 08/03/2015 15:47   Dg Chest Port 1 View  08/04/2015   CLINICAL DATA:  Post  right-sided thoracentesis  EXAM: PORTABLE CHEST - 1 VIEW  COMPARISON:  08/03/2015 ; 08/02/2015 ; chest CT - 07/24/2015  FINDINGS: Interval reduction in persistent small partially loculated right-sided effusion post thoracentesis. No pneumothorax. Unchanged trace left-sided effusion. Improved aeration of the right lung with persistent bibasilar heterogeneous/consolidative opacities. No new focal airspace opacities. No evidence of edema. No acute osseus abnormalities.  IMPRESSION: 1. Interval reduction in persistent small right-sided effusion post thoracentesis. No pneumothorax. 2. Unchanged trace left-sided pleural effusion. 3. Improved aeration of the right lower lung with persistent  bibasilar heterogeneous/consolidative opacities, left greater than right, atelectasis versus infiltrate.   Electronically Signed   By: Sandi Mariscal M.D.   On: 08/04/2015 16:02   US Thoracentesis Asp Pleural Space W/img Guide  08/04/2015   INDICATION: Symptomatic right sided pleural effusion - please perform ultrasound-guided thoracentesis for diagnostic and therapeutic purposes.  EXAM: US THORACENTESIS ASP PLEURAL SPACE W/IMG GUIDE  COMPARISON:  Chest radiograph - 08/03/2015  MEDICATIONS: None  COMPLICATIONS: None immediate  TECHNIQUE: Informed written consent was obtained from the patient after a discussion of the risks, benefits and alternatives to treatment. A timeout was performed prior to the initiation of the procedure.  Initial ultrasound scanning demonstrates a small to moderate sized right-sided pleural effusion. The lower chest was prepped and draped in the usual sterile fashion. 1% lidocaine was used for local anesthesia.  Under direct ultrasound guidance, a 19 gauge, 7-cm, Yueh catheter was introduced. An ultrasound image was saved for documentation purposes. The thoracentesis was performed. The catheter was removed and a dressing was applied. The patient tolerated the procedure well without immediate post procedural  complication. The patient was escorted to have an upright chest radiograph.  FINDINGS: A total of approximately 1 liter of serous fluid was removed. Requested samples were sent to the laboratory.  IMPRESSION: Successful ultrasound-guided right sided thoracentesis yielding 1 liter of pleural fluid.   Electronically Signed   By: Sandi Mariscal M.D.   On: 08/04/2015 16:07     Medications:   . norepinephrine 5 mcg/min (08/03/15 0600)   . amiodarone  100 mg Oral Daily  . cefTRIAXone (ROCEPHIN)  IV  1 g Intravenous Q24H  . Chlorhexidine Gluconate Cloth  6 each Topical Q0600  . clindamycin (CLEOCIN) IV  600 mg Intravenous 3 times per day  . dextromethorphan  30 mg Oral BID   And  . guaiFENesin  600 mg Oral BID  . heparin  5,000 Units Subcutaneous 3 times per day  . insulin aspart  0-5 Units Subcutaneous QHS  . insulin aspart  0-9 Units Subcutaneous TID WC  . ipratropium-albuterol  3 mL Nebulization Q4H  . mupirocin ointment  1 application Nasal BID  . propranolol  80 mg Oral BID  . vancomycin  750 mg Intravenous Q M,W,F-HD   sodium chloride, sodium chloride, acetaminophen **OR** acetaminophen, alteplase, benzonatate, docusate sodium, feeding supplement (NEPRO CARB STEADY), heparin, lidocaine (PF), lidocaine-prilocaine, ondansetron (ZOFRAN) IV, pentafluoroprop-tetrafluoroeth, senna  Assessment/ Plan:  81 y.o. female Diabetes mellitus type 2 x 20 years, ESRD ON HD, HTN ,Psoriasis, Sleep apnea, CPAP, Recurrent vertigo, Essential tremor, Bilateral knee replacement, ACD, Secondary hyperparathyroidism, Cholcystecomy, Hysterectomy, Bilateral Tubal ligation, Carpal tunnel release, left radiocephalic AVF 4/0/10, admission for chest pain at [facility] 9/13, admission to [facility] for Afib 10/14, on chronic anticoagulation, left hip fracture, left ureteral stones, admission for pneumonia 07/2015. left main and right coronary artery atherosclerosis. Liver cirrhosis. Cardiomegaly with biatrial dilatation, mitral valve  calcification.    1.  ESRD on HD MWF:  - Dialysis tomorrow  2.  Hypotension:   - likely due to arrythmia - continue to monitor  3.  Anemia of CKD:   - hgb 10.3,  - epogen with dialysis  4.  SHPTH:  - monitor Phos this hosptialization - start phoslo  5.  Shortness of breath. Patient's CT scan shows consolidation in the lower lobes of the lungs bilaterally especially right. Concerning for recurrent aspiration pneumonia. It also shows left main and right coronary artery atherosclerosis. Liver cirrhosis. Cardiomegaly with biatrial dilatation, mitral  valve calcification.   LOS: 3 Shatarra Wehling 8/23/20169:44 AM

## 2015-08-06 ENCOUNTER — Encounter
Admission: EM | Disposition: A | Discharge status: Skilled Nursing Facility | Payer: Self-pay | Source: Home / Self Care | Attending: Internal Medicine

## 2015-08-06 ENCOUNTER — Ambulatory Visit: Admission: RE | Admit: 2015-08-06 | Payer: Medicare Other | Source: Ambulatory Visit | Admitting: Vascular Surgery

## 2015-08-06 DIAGNOSIS — E43 Unspecified severe protein-calorie malnutrition: Secondary | ICD-10-CM | POA: Insufficient documentation

## 2015-08-06 HISTORY — PX: PERIPHERAL VASCULAR CATHETERIZATION: SHX172C

## 2015-08-06 LAB — GLUCOSE, CAPILLARY
GLUCOSE-CAPILLARY: 163 mg/dL — AB (ref 65–99)
GLUCOSE-CAPILLARY: 151 mg/dL — AB (ref 65–99)
GLUCOSE-CAPILLARY: 159 mg/dL — AB (ref 65–99)
Glucose-Capillary: 173 mg/dL — ABNORMAL HIGH (ref 65–99)

## 2015-08-06 LAB — CBC WITH DIFFERENTIAL/PLATELET
BASOS ABS: 0 10*3/uL (ref 0.0–0.1)
BASOS PCT: 0 % (ref 0–1)
Band Neutrophils: 1 % (ref 0–10)
Blasts: 0 %
EOS PCT: 0 % (ref 0–5)
Eosinophils Absolute: 0 10*3/uL (ref 0.0–0.7)
HCT: 27.9 % — ABNORMAL LOW (ref 35.0–47.0)
Hemoglobin: 9.3 g/dL — ABNORMAL LOW (ref 12.0–16.0)
LYMPHS ABS: 1.5 10*3/uL (ref 0.7–4.0)
Lymphocytes Relative: 10 % — ABNORMAL LOW (ref 12–46)
MCH: 34.8 pg — AB (ref 26.0–34.0)
MCHC: 33.2 g/dL (ref 32.0–36.0)
MCV: 104.8 fL — AB (ref 80.0–100.0)
METAMYELOCYTES PCT: 4 %
MONO ABS: 0.8 10*3/uL (ref 0.1–1.0)
MONOS PCT: 5 % (ref 3–12)
MYELOCYTES: 0 %
NEUTROS ABS: 12.8 10*3/uL — AB (ref 1.7–7.7)
NRBC: 0 /100{WBCs}
Neutrophils Relative %: 80 % — ABNORMAL HIGH (ref 43–77)
Other: 0 %
PLATELETS: 164 10*3/uL (ref 150–440)
Promyelocytes Absolute: 0 %
RBC: 2.66 MIL/uL — AB (ref 3.80–5.20)
RDW: 15.4 % — AB (ref 11.5–14.5)
WBC: 15.1 10*3/uL — ABNORMAL HIGH (ref 3.6–11.0)

## 2015-08-06 LAB — COMPREHENSIVE METABOLIC PANEL
ALT: 10 U/L — AB (ref 14–54)
AST: 20 U/L (ref 15–41)
Albumin: 2.2 g/dL — ABNORMAL LOW (ref 3.5–5.0)
Alkaline Phosphatase: 159 U/L — ABNORMAL HIGH (ref 38–126)
Anion gap: 10 (ref 5–15)
BUN: 27 mg/dL — ABNORMAL HIGH (ref 6–20)
CHLORIDE: 93 mmol/L — AB (ref 101–111)
CO2: 30 mmol/L (ref 22–32)
Calcium: 7.7 mg/dL — ABNORMAL LOW (ref 8.9–10.3)
Creatinine, Ser: 4.73 mg/dL — ABNORMAL HIGH (ref 0.44–1.00)
GFR, EST AFRICAN AMERICAN: 9 mL/min — AB (ref >60)
GFR, EST NON AFRICAN AMERICAN: 8 mL/min — AB (ref >60)
Glucose, Bld: 176 mg/dL — ABNORMAL HIGH (ref 65–99)
POTASSIUM: 4.5 mmol/L (ref 3.5–5.1)
SODIUM: 133 mmol/L — AB (ref 135–145)
Total Bilirubin: 1 mg/dL (ref 0.3–1.2)
Total Protein: 6 g/dL — ABNORMAL LOW (ref 6.5–8.1)

## 2015-08-06 LAB — VANCOMYCIN, TROUGH: VANCOMYCIN TR: 20 ug/mL (ref 10–20)

## 2015-08-06 SURGERY — A/V SHUNTOGRAM/FISTULAGRAM
Anesthesia: Moderate Sedation | Laterality: Left

## 2015-08-06 MED ORDER — CEFAZOLIN SODIUM 1-5 GM-% IV SOLN
INTRAVENOUS | Status: AC
  Filled 2015-08-06: qty 50

## 2015-08-06 MED ORDER — IOHEXOL 300 MG/ML  SOLN
INTRAMUSCULAR | Status: DC | PRN
  Administered 2015-08-06: 25 mL

## 2015-08-06 MED ORDER — VANCOMYCIN HCL IN DEXTROSE 750-5 MG/150ML-% IV SOLN
750.0000 mg | INTRAVENOUS | Status: DC
  Administered 2015-08-06 – 2015-08-08 (×3): 750 mg via INTRAVENOUS
  Filled 2015-08-06 (×5): qty 150

## 2015-08-06 MED ORDER — HEPARIN (PORCINE) IN NACL 2-0.9 UNIT/ML-% IJ SOLN
INTRAMUSCULAR | Status: AC
  Filled 2015-08-06: qty 1000

## 2015-08-06 MED ORDER — CLINDAMYCIN PHOSPHATE 300 MG/50ML IV SOLN
INTRAVENOUS | Status: AC
  Administered 2015-08-06: 09:00:00
  Filled 2015-08-06: qty 50

## 2015-08-06 MED ORDER — HEPARIN SODIUM (PORCINE) 1000 UNIT/ML IJ SOLN
INTRAMUSCULAR | Status: DC | PRN
  Administered 2015-08-06: 3000 [IU] via INTRAVENOUS

## 2015-08-06 MED ORDER — MIDAZOLAM HCL 2 MG/2ML IJ SOLN
INTRAMUSCULAR | Status: DC | PRN
  Administered 2015-08-06: 2 mg via INTRAVENOUS

## 2015-08-06 MED ORDER — FENTANYL CITRATE (PF) 100 MCG/2ML IJ SOLN
INTRAMUSCULAR | Status: AC
  Filled 2015-08-06: qty 2

## 2015-08-06 MED ORDER — PANTOPRAZOLE SODIUM 40 MG PO TBEC
40.0000 mg | DELAYED_RELEASE_TABLET | Freq: Two times a day (BID) | ORAL | Status: DC
  Administered 2015-08-06 – 2015-08-11 (×10): 40 mg via ORAL
  Filled 2015-08-06 (×10): qty 1

## 2015-08-06 MED ORDER — HEPARIN SODIUM (PORCINE) 1000 UNIT/ML IJ SOLN
INTRAMUSCULAR | Status: AC
  Filled 2015-08-06: qty 1

## 2015-08-06 MED ORDER — MIDAZOLAM HCL 5 MG/5ML IJ SOLN
INTRAMUSCULAR | Status: AC
  Filled 2015-08-06: qty 5

## 2015-08-06 MED ORDER — VANCOMYCIN HCL IN DEXTROSE 750-5 MG/150ML-% IV SOLN
750.0000 mg | INTRAVENOUS | Status: DC
  Filled 2015-08-06: qty 150

## 2015-08-06 MED ORDER — FENTANYL CITRATE (PF) 100 MCG/2ML IJ SOLN
INTRAMUSCULAR | Status: DC | PRN
  Administered 2015-08-06: 50 ug via INTRAVENOUS

## 2015-08-06 MED ORDER — LIDOCAINE-EPINEPHRINE (PF) 1 %-1:200000 IJ SOLN
INTRAMUSCULAR | Status: AC
  Filled 2015-08-06: qty 30

## 2015-08-06 SURGICAL SUPPLY — 11 items
BAG DECANTER STRL (MISCELLANEOUS) ×3 IMPLANT
BALLN LUTONIX DCB 7X60X130 (BALLOONS) ×3
BALLOON LUTONIX DCB 7X60X130 (BALLOONS) ×1 IMPLANT
CANNULA 5F STIFF (CANNULA) ×3 IMPLANT
CATH TORCON 5FR 0.38 (CATHETERS) ×3 IMPLANT
DEVICE PRESTO INFLATION (MISCELLANEOUS) ×3 IMPLANT
DRAPE BRACHIAL (DRAPES) ×3 IMPLANT
PACK ANGIOGRAPHY (CUSTOM PROCEDURE TRAY) ×3 IMPLANT
SHEATH BRITE TIP 6FRX5.5 (SHEATH) ×3 IMPLANT
TOWEL OR 17X26 4PK STRL BLUE (TOWEL DISPOSABLE) ×3 IMPLANT
WIRE MAGIC TOR.035 180C (WIRE) ×3 IMPLANT

## 2015-08-06 NOTE — Clinical Social Work Note (Signed)
Clinical Social Work Assessment  Patient Details  Name: [name] MRN: [id number] Date of Birth: 11/20/1934  Date of referral:  08/06/15               Reason for consult:  Facility Placement                Permission sought to share information with:  Facility Sport and exercise psychologist, Family Supports Permission granted to share information::  Yes, Verbal Permission Granted  Name::     husband [name] [address]::  SNF facilities in [place]   Relationship::     Contact Information:     Housing/Transportation Living arrangements for the past 2 months:  [place] of Information:  Patient, Spouse Patient Interpreter Needed:  None Criminal Activity/Legal Involvement Pertinent to Current Situation/Hospitalization:  No - Comment as needed Significant Relationships:  Spouse Lives with:  Spouse Do you feel safe going back to the place where you live?  No Need for family participation in patient care:  No (Coment)  Care giving concerns: Concerns that were expressed by pt and her husband were where pt would be placed for STR.  CSW stated that she would see what was available through a bed search.     Social Worker assessment / plan:  CSW spoke to pt and her husband while in the room.  Pt was A&Ox4  [place] two of them in the home.  Pt's husband stated that for the last 6 month pt had been using a wheel chair for most of her ambulation.  Pt estimates that she can now put about 50% of her weight on her legs now.  CSW will review this with pt's assessment.  CSW was given permission to do a bed search for pt.  Current preference is Psychologist, forensic.    Employment status:  Retired, Disabled (Comment on whether or not currently receiving Disability) Insurance information:  Medicare PT Recommendations:  [place] / Referral to community resources:  [place]  Patient/Family's Response to care:  Pt and pt's husband were in agreement with SNF placement.  Patient/Family's Understanding of and Emotional Response to Diagnosis, Current Treatment, and Prognosis:   Pt and pt's husband were in agreement with SNF placement, both verbalized their understanding of this DC plan.  Emotional Assessment Appearance:  Appears stated age Attitude/Demeanor/Rapport:   (pleasently confused) Affect (typically observed):  Appropriate Orientation:  Oriented to Self, Oriented to Place, Oriented to  Time, Oriented to Situation Alcohol / Substance use:  Never Used Psych involvement (Current and /or in the community):  No (Comment)  Discharge Needs  Concerns to be addressed:  Care Coordination Readmission within the last 30 days:  No Current discharge risk:  Physical Impairment Barriers to Discharge:  No Barriers Identified   Mathews Argyle, LCSW 08/06/2015, 5:55 PM

## 2015-08-06 NOTE — Care Management (Signed)
REceived patient from icu.  Per attending, patient has been treated on an outpatient basis for a complicated pneumonia that did not respond to outpatient treatment.  She was admitted with sepsis.  She is a chronic hemodialysis patient and there are problems with her fistula.  She is to undergo intervention for this today.  She has a central line. Patient is going to require at least an additional week of IV antibiotics.   Blood culture on 8/20 so far showing no growth.  Pleural fluid 8/20 and 8/22 so far showing no growth.  Gram positive cocci and wbcs in sputum. It appears this is the final result.  Attending states organism most likely staff.  Patient has had thoracenthesis 8/220 and 8/22.  One liter was removed 8/23.  Physical therapy has recommended skilled nursing.  Discussed with attending that patient can receive IV antibiotics in a skilled nursing facility.  Barrier at present to discharge is the problem with her HD fistula which hampers ability to receive her chronic dialysis.

## 2015-08-06 NOTE — Progress Notes (Signed)
Called report to nurse latoya, pt sleep easily arousable, central line RIJ intact ports remain capped and clamped, dressing left arm HD site cdi positive thrill and bruit.

## 2015-08-06 NOTE — Progress Notes (Signed)
Inpatient Diabetes Program Recommendations  AACE/ADA: New Consensus Statement on Inpatient Glycemic Control (2013)  Target Ranges:  Prepandial:   less than 140 mg/dL      Peak postprandial:   less than 180 mg/dL (1-2 hours)      Critically ill patients:  140 - 180 mg/dL   Results for KEERA, ALTIDOR (MRN [id number]) as of 08/06/2015 10:56  Ref. Range 08/05/2015 07:27 08/05/2015 12:38 08/05/2015 17:13 08/05/2015 20:37 08/06/2015 07:59  Glucose-Capillary Latest Ref Range: 65-99 mg/dL 152 (H) 174 (H) 240 (H) 228 (H) 163 (H)   Diabetes history: DM 2 Outpatient Diabetes medications: 70/30 15-40 units BID based on glucose levels Current orders for Inpatient glycemic control: Novolog Sensitive + HS scales  Inpatient Diabetes Program Recommendations Insulin - Meal Coverage: If patient is eating at least 50% of meals, please consider ordering Novolog 3 units TID with meals for meal coverage (in addition to Novolog correction scale).  Thanks,  Tama Headings RN, MSN, [facility] Inpatient Diabetes Coordinator Team Pager [phone] (8am-5pm)

## 2015-08-06 NOTE — Progress Notes (Signed)
PT Attempt Note  Patient Details Name: [name] MRN: [id number] DOB: 05-Oct-1934   Cancelled Treatment:    Reason Eval/Treat Not Completed: Patient at procedure or test/unavailable. Pt is out of room for dialysis and unavailable for treatment. Will attempt on later date as patient as available.  Lyndel Safe Lanijah Warzecha PT, DPT   Daun Rens 08/06/2015, 2:22 PM

## 2015-08-06 NOTE — Progress Notes (Signed)
Inpatient Diabetes Program Recommendations  AACE/ADA: New Consensus Statement on Inpatient Glycemic Control (2013)  Target Ranges:  Prepandial:   less than 140 mg/dL      Peak postprandial:   less than 180 mg/dL (1-2 hours)      Critically ill patients:  140 - 180 mg/dL   Results for MAZEL, VILLELA (MRN [id number]) as of 08/06/2015 10:52  Ref. Range 08/05/2015 07:27 08/05/2015 12:38 08/05/2015 17:13 08/05/2015 20:37 08/06/2015 07:59  Glucose-Capillary Latest Ref Range: 65-99 mg/dL 152 (H) 174 (H) 240 (H) 228 (H) 163 (H)    Diabetes history: DM Outpatient Diabetes medications: 70/30 15-40 units BID with meals (depending on glucose) Current orders for Inpatient glycemic control: Novolog 0-9 units TID with meals, Novolog 0-5 units HS  Inpatient Diabetes Program Recommendations Insulin - Meal Coverage: Postprandial glucose is consistently elevated.  If patient is eating at least 50% of meals, please consider ordering Novolog 3 units TID with meals for meal coverage (in addition to Novolog correction scale).  Thanks, Barnie Alderman, RN, MSN, CCRN, CDE Diabetes Coordinator Inpatient Diabetes Program [phone] (Team Pager from [place] to [place]) [phone] ([place] office) [phone] [facility] [place] office) [phone] [facility] office)

## 2015-08-06 NOTE — Clinical Social Work Placement (Signed)
   CLINICAL SOCIAL WORK PLACEMENT  NOTE  Date:  08/06/2015  Patient Details  Name: [name] MRN: [id number] Date of Birth: January 06, 1934  Clinical Social Work is seeking post-discharge placement for this patient at the [place] level of care (*CSW will initial, date and re-position this form in  chart as items are completed):  Yes   Patient/family provided with [facility] Work Department's list of facilities offering this level of care within the geographic area requested by the patient (or if unable, by the patient's family).  Yes   Patient/family informed of their freedom to choose among providers that offer the needed level of care, that participate in Medicare, Medicaid or managed care program needed by the patient, have an available bed and are willing to accept the patient.  Yes   Patient/family informed of [facility]'s ownership interest in [facility] and [facility], as well as of the fact that they are under no obligation to receive care at these facilities.  PASRR submitted to EDS on       PASRR number received on       Existing PASRR number confirmed on 08/06/15     FL2 transmitted to all facilities in geographic area requested by pt/family on 08/06/15     FL2 transmitted to all facilities within larger geographic area on       Patient informed that his/her managed care company has contracts with or will negotiate with certain facilities, including the following:            Patient/family informed of bed offers received.  Patient chooses bed at       Physician recommends and patient chooses bed at      Patient to be transferred to   on  .  Patient to be transferred to facility by       Patient family notified on   of transfer.  Name of family member notified:        PHYSICIAN Please sign FL2     Additional Comment:  CSW will f/u with family once offers have been  received.  _______________________________________________ Mathews Argyle, LCSW 08/06/2015, 6:03 PM

## 2015-08-06 NOTE — Progress Notes (Signed)
Patient ID: [name], female   DOB: [birth year], 81 y.o.   MRN: [id number] Patient ID: [name], female   DOB: [birth year], 81 y.o.   MRN: [id number] [name] is a 81 y.o. female   SUBJECTIVE:  Patient admitted with progressive cough, dyspnea, tachycardia and hypotension. Post ICU, still mild cough, off IV pressors, blood pressure stable ______________________________________________________________________  ROS: Review of systems is unremarkable for any active cardiac,respiratory, GI, GU, hematologic, neurologic or psychiatric systems, 10 systems reviewed.  Marland Kitchen amiodarone  100 mg Oral Daily  . calcium acetate  1,334 mg Oral TID WC  . cefTRIAXone (ROCEPHIN)  IV  1 g Intravenous Q24H  . Chlorhexidine Gluconate Cloth  6 each Topical Q0600  . clindamycin (CLEOCIN) IV  600 mg Intravenous 3 times per day  . dextromethorphan  30 mg Oral BID   And  . guaiFENesin  600 mg Oral BID  . insulin aspart  0-5 Units Subcutaneous QHS  . insulin aspart  0-9 Units Subcutaneous TID WC  . ipratropium-albuterol  3 mL Nebulization Q4H  . mupirocin ointment  1 application Nasal BID  . pantoprazole  40 mg Oral BID  . sevelamer carbonate  1,600 mg Oral TID WC  . vancomycin  750 mg Intravenous Q M,W,F-HD   acetaminophen **OR** acetaminophen, alteplase, benzonatate, docusate sodium, heparin, lidocaine (PF), lidocaine-prilocaine, ondansetron (ZOFRAN) IV, pentafluoroprop-tetrafluoroeth, senna   Past Medical History  Diagnosis Date  . Skin cancer     Resected from legs  . Renal insufficiency     Patient is on dialysis and normal days are M,W and F.  . Diabetes mellitus without complication     Patient takes Insulin  . ESRD (end stage renal disease)     Monday, wednesday, Friday DIalysis  . HTN (hypertension)   . Essential tremor   . OSA (obstructive sleep apnea)   . A-fib     not on anticoagulation  . Bilateral lower extremity edema   . Osteoarthritis     Past Surgical History  Procedure  Laterality Date  . Abdominal hysterectomy    . Cholecystectomy    . Femur fracture surgery      left femur  . Tubal ligation    . Ankle fracture surgery      right ankle  . Neuroplasty / transposition median nerve at carpal tunnel bilateral    . Knee arthroplasty      PHYSICAL EXAM:  BP 117/54 mmHg  Pulse 94  Temp(Src) 97.5 F (36.4 C) (Oral)  Resp 20  Ht 5\' 1"  (1.549 m)  Wt 101 kg (222 lb 10.6 oz)  BMI 42.09 kg/m2  SpO2 98%  Wt Readings from Last 3 Encounters:  08/04/15 101 kg (222 lb 10.6 oz)           BP Readings from Last 3 Encounters:  08/06/15 117/54    Constitutional: NAD Neck: supple, no thyromegaly Respiratory: Reduced breath sounds on the right Cardiovascular: RRR, no murmur, no gallop Abdomen: soft, good BS, nontender Extremities: no edema Neuro: alert and oriented, no focal motor or sensory deficits  ASSESSMENT/PLAN:  Labs and imaging studies were reviewed  Progressive complicated pneumonia-is been on Levaquin several times, on Rocephin/clindamycin/vancomycin, sputum culture pending, likely staph Right effusion-post thoracentesis, cultures negative thus far Hypotension-resolved, off of IV pressors A. fib-back to normal sinus rhythm, amiodarone, echo pending Diabetes-sliding scale ESRD-dialysis Monday Wednesday Friday, work on shunt today OSA-continue CPAP at bedtime Will need a full 7-10 days of IV antibiotics since failure  of multiple outpatient antibiotics with complicated pneumonia. Likely will need skilled nursing on Monday versus home

## 2015-08-06 NOTE — Progress Notes (Signed)
Subjective:  Patient seen during dialysis Tolerating well  States she does not feel as good today   Objective:  Vital signs in last 24 hours:  Temp:  [97 F (36.1 C)-98.2 F (36.8 C)] 97.2 F (36.2 C) (08/24 1657) Pulse Rate:  [79-94] 89 (08/24 1710) Resp:  [17-28] 28 (08/24 1710) BP: (97-140)/(37-81) 122/57 mmHg (08/24 1710) SpO2:  [93 %-100 %] 100 % (08/24 1710) Weight:  [95.9 kg (211 lb 6.7 oz)-96.9 kg (213 lb 10 oz)] 95.9 kg (211 lb 6.7 oz) (08/24 1710)  Weight change:  Filed Weights   08/04/15 1715 08/06/15 1254 08/06/15 1710  Weight: 101 kg (222 lb 10.6 oz) 96.9 kg (213 lb 10 oz) 95.9 kg (211 lb 6.7 oz)    Intake/Output: I/O last 3 completed shifts: In: 630 [P.O.:480; IV Piggyback:150] Out: -67    Intake/Output this shift:  Total I/O In: -  Out: 1000 [Other:1000]  Physical Exam: General: NAD, resting in bed  Head: Normocephalic, atraumatic. Moist oral mucosal membranes  Eyes: Anicteric  Neck: Supple, trachea midline  Lungs:  Bibasilar rales , Rt lower field rhonchi  Heart: Irregular, A Fib,   Abdomen:  Soft, nontender, obese, BS present  Extremities:  1+ peripheral edema.  Neurologic: Nonfocal, moving all four extremities  Skin: No lesions  Access: LUE AVF    Basic Metabolic Panel:  Recent Labs Lab 08/02/15 0947 08/03/15 0621 08/04/15 1735 08/05/15 0822 08/06/15 0500  NA 138 134*  --  138 133*  K 4.0 4.8  --  4.8 4.5  CL 93* 95*  --  97* 93*  CO2 35* 27  --  31 30  GLUCOSE 115* 165*  --  163* 176*  BUN 14 20  --  18 27*  CREATININE 4.63* 5.15*  --  4.09* 4.73*  CALCIUM 8.0* 7.5*  --  7.8* 7.7*  PHOS  --   --  7.7*  --   --     Liver Function Tests:  Recent Labs Lab 08/02/15 0947 08/06/15 0500  AST 21 20  ALT 9* 10*  ALKPHOS 192* 159*  BILITOT 1.0 1.0  PROT 6.9 6.0*  ALBUMIN 2.6* 2.2*    Recent Labs Lab 08/02/15 0947  LIPASE 17*   No results for input(s): AMMONIA in the last 168 hours.  CBC:  Recent Labs Lab  08/02/15 0947 08/03/15 0621 08/05/15 0822 08/06/15 0500  WBC 15.1* 20.8* 15.2* 15.1*  NEUTROABS 11.5*  --  12.1* 12.8*  HGB 10.7* 11.3* 10.3* 9.3*  HCT 32.9* 35.2 31.2* 27.9*  MCV 103.3* 106.8* 104.1* 104.8*  PLT 278 189 181 164    Cardiac Enzymes:  Recent Labs Lab 08/02/15 0947  TROPONINI 0.03    BNP: Invalid input(s): POCBNP  CBG:  Recent Labs Lab 08/05/15 1238 08/05/15 1713 08/05/15 2037 08/06/15 0759 08/06/15 1129  GLUCAP 174* 240* 228* 163* 151*    Microbiology: Results for orders placed or performed during the hospital encounter of 08/02/15  Blood culture (routine x 2)     Status: None (Preliminary result)   Collection Time: 08/02/15 11:59 AM  Result Value Ref Range Status   Specimen Description BLOOD LEFT ASSIST CONTROL  Final   Special Requests   Final    BOTTLES DRAWN AEROBIC AND ANAEROBIC  AER 6CC ANA 1CC   Culture NO GROWTH 4 DAYS  Final   Report Status PENDING  Incomplete  Blood culture (routine x 2)     Status: None (Preliminary result)   Collection Time: 08/02/15 12:00  PM  Result Value Ref Range Status   Specimen Description BLOOD LEFT FOREARM  Final   Special Requests BOTTLES DRAWN AEROBIC AND ANAEROBIC  1CC  Final   Culture NO GROWTH 4 DAYS  Final   Report Status PENDING  Incomplete  Culture, expectorated sputum-assessment     Status: None   Collection Time: 08/02/15  8:37 PM  Result Value Ref Range Status   Specimen Description SPUTUM  Final   Special Requests Normal  Final   Sputum evaluation THIS SPECIMEN IS ACCEPTABLE FOR SPUTUM CULTURE  Final   Report Status 08/04/2015 FINAL  Final  MRSA PCR Screening     Status: Abnormal   Collection Time: 08/02/15  8:37 PM  Result Value Ref Range Status   MRSA by PCR POSITIVE (A) NEGATIVE Final    Comment:        The GeneXpert MRSA Assay (FDA approved for NASAL specimens only), is one component of a comprehensive MRSA colonization surveillance program. It is not intended to diagnose  MRSA infection nor to guide or monitor treatment for MRSA infections. CRITICAL RESULT CALLED TO, READ BACK BY AND VERIFIED WITH: MARCEL TURNER AT 2214 08/02/15.PMH   Culture, respiratory (NON-Expectorated)     Status: None   Collection Time: 08/02/15  8:37 PM  Result Value Ref Range Status   Specimen Description SPUTUM  Final   Special Requests Normal Reflexed from [id number]  Final   Gram Stain   Final    FAIR SPECIMEN - 70-80% WBCS FEW WBC SEEN MANY GRAM POSITIVE COCCI    Culture NORMAL UPPER RESPIRATORY FLORA  Final   Report Status 08/05/2015 FINAL  Final  Body fluid culture     Status: None (Preliminary result)   Collection Time: 08/04/15  2:51 PM  Result Value Ref Range Status   Specimen Description PLEURAL  Final   Special Requests NONE  Final   Gram Stain NO WBC SEEN NO ORGANISMS SEEN   Final   Culture NO GROWTH 2 DAYS  Final   Report Status PENDING  Incomplete    Coagulation Studies:  Recent Labs  08/04/15 1245  LABPROT 15.0  INR 1.16    Urinalysis: No results for input(s): COLORURINE, LABSPEC, PHURINE, GLUCOSEU, HGBUR, BILIRUBINUR, KETONESUR, PROTEINUR, UROBILINOGEN, NITRITE, LEUKOCYTESUR in the last 72 hours.  Invalid input(s): APPERANCEUR    Imaging: Dg Chest 1 View  08/05/2015   CLINICAL DATA:  Right IJ placed  EXAM: CHEST  1 VIEW  COMPARISON:  08/05/2015  FINDINGS: There is a right IJ catheter with tip at the cavoatrial junction. There is moderate cardiac enlargement. Large right pleural effusion is similar in volume to the previous exam. The left lung is relatively clear.  IMPRESSION: Tip of the right IJ catheter is in the cavoatrial junction.  Large right pleural effusion as before.   Electronically Signed   By: Kerby Moors M.D.   On: 08/05/2015 19:51   Dg Chest Port 1 View  08/05/2015   CLINICAL DATA:  PICC line placement.  EXAM: PORTABLE CHEST - 1 VIEW  COMPARISON:  08/04/2015.  FINDINGS: A right IJ PICC line is noted with tip overlying the superior  cavoatrial junction.  Slightly increased right lower lung atelectasis/right pleural effusion noted.  Cardiomegaly and vascular congestion again noted as well as left basilar atelectasis/ airspace disease.  There is no evidence of pneumothorax.  IMPRESSION: Right central venous catheter/PICC line with tip overlying the superior cavoatrial junction.  Slightly increased right lower lung atelectasis/right pleural effusion.  Unchanged  cardiomegaly, pulmonary vascular congestion and left basilar atelectasis/ airspace disease   Electronically Signed   By: Margarette Canada M.D.   On: 08/05/2015 18:34     Medications:     . amiodarone  100 mg Oral Daily  . calcium acetate  1,334 mg Oral TID WC  . cefTRIAXone (ROCEPHIN)  IV  1 g Intravenous Q24H  . Chlorhexidine Gluconate Cloth  6 each Topical Q0600  . clindamycin (CLEOCIN) IV  600 mg Intravenous 3 times per day  . dextromethorphan  30 mg Oral BID   And  . guaiFENesin  600 mg Oral BID  . insulin aspart  0-5 Units Subcutaneous QHS  . insulin aspart  0-9 Units Subcutaneous TID WC  . ipratropium-albuterol  3 mL Nebulization Q4H  . mupirocin ointment  1 application Nasal BID  . pantoprazole  40 mg Oral BID  . sevelamer carbonate  1,600 mg Oral TID WC  . vancomycin  750 mg Intravenous Q M,W,F-HD   acetaminophen **OR** acetaminophen, alteplase, benzonatate, docusate sodium, heparin, lidocaine (PF), lidocaine-prilocaine, ondansetron (ZOFRAN) IV, pentafluoroprop-tetrafluoroeth, senna  Assessment/ Plan:  81 y.o. female Diabetes mellitus type 2 x 20 years, ESRD ON HD, HTN ,Psoriasis, Sleep apnea, CPAP, Recurrent vertigo, Essential tremor, Bilateral knee replacement, ACD, Secondary hyperparathyroidism, Cholcystecomy, Hysterectomy, Bilateral Tubal ligation, Carpal tunnel release, left radiocephalic AVF 7/0/96, admission for chest pain at [facility] 9/13, admission to [facility] for Afib 10/14, on chronic anticoagulation, left hip fracture, left ureteral stones, admission for  pneumonia 07/2015. left main and right coronary artery atherosclerosis. Liver cirrhosis. Cardiomegaly with biatrial dilatation, mitral valve calcification.    1.  ESRD on HD MWF:  - Patient seen during dialysis Tolerating well   2.  Hypotension:   - likely due to arrythmia - continue to monitor  3.  Anemia of CKD:   - hgb 9.3,  - epogen with dialysis  4.  SHPTH:  - monitor Phos this hosptialization - start phoslo  5.  Shortness of breath. Patient's CT scan shows consolidation in the lower lobes of the lungs bilaterally especially right. Concerning for recurrent aspiration pneumonia. It also shows left main and right coronary artery atherosclerosis. Liver cirrhosis. Cardiomegaly with biatrial dilatation, mitral valve calcification.   LOS: 4 Keavon Sensing 8/24/20165:21 PM

## 2015-08-06 NOTE — Progress Notes (Addendum)
ANTIBIOTIC CONSULT NOTE - FOLLOW UP  Pharmacy Consult for Vancomyicn Indication: pneumonia   Vancomycin 750mg  IV after HD -- HCAP--Day 3 (start date 8/21) Clindamycin 600mg  IV q8hrs--HCAP-- Day 3 (start 8/21) Ceftriaxone 1g IV q24 hrs-- HCAP-- Day 2 (start 8/22)   Allergies  Allergen Reactions  . Angiotensin Receptor Blockers     Other reaction(s): Other (See Comments) hyperkalemia  . Penicillins Rash    Patient Measurements: Height: 5\' 1"  (154.9 cm) Weight: 222 lb 10.6 oz (101 kg) IBW/kg (Calculated) : 47.8 Adjusted Body Weight: 67.4 kg  Vital Signs: Temp: 97.5 F (36.4 C) (08/24 0429) Temp Source: Oral (08/24 0429) BP: 117/54 mmHg (08/24 0429) Pulse Rate: 94 (08/24 0429)  Labs:  Recent Labs  08/05/15 0822 08/06/15 0500  WBC 15.2* 15.1*  HGB 10.3* 9.3*  PLT 181 164  CREATININE 4.09* 4.73*   Estimated Creatinine Clearance: 10.5 mL/min (by C-G formula based on Cr of 4.73).  Microbiology: Recent Results (from the past 720 hour(s))  Blood culture (routine x 2)     Status: None (Preliminary result)   Collection Time: 08/02/15 11:59 AM  Result Value Ref Range Status   Specimen Description BLOOD LEFT ASSIST CONTROL  Final   Special Requests   Final    BOTTLES DRAWN AEROBIC AND ANAEROBIC  AER 6CC ANA 1CC   Culture NO GROWTH 3 DAYS  Final   Report Status PENDING  Incomplete  Blood culture (routine x 2)     Status: None (Preliminary result)   Collection Time: 08/02/15 12:00 PM  Result Value Ref Range Status   Specimen Description BLOOD LEFT FOREARM  Final   Special Requests BOTTLES DRAWN AEROBIC AND ANAEROBIC  1CC  Final   Culture NO GROWTH 3 DAYS  Final   Report Status PENDING  Incomplete  Culture, expectorated sputum-assessment     Status: None   Collection Time: 08/02/15  8:37 PM  Result Value Ref Range Status   Specimen Description SPUTUM  Final   Special Requests Normal  Final   Sputum evaluation THIS SPECIMEN IS ACCEPTABLE FOR SPUTUM CULTURE  Final   Report Status 08/04/2015 FINAL  Final  MRSA PCR Screening     Status: Abnormal   Collection Time: 08/02/15  8:37 PM  Result Value Ref Range Status   MRSA by PCR POSITIVE (A) NEGATIVE Final    Comment:        The GeneXpert MRSA Assay (FDA approved for NASAL specimens only), is one component of a comprehensive MRSA colonization surveillance program. It is not intended to diagnose MRSA infection nor to guide or monitor treatment for MRSA infections. CRITICAL RESULT CALLED TO, READ BACK BY AND VERIFIED WITH: MARCEL TURNER AT 2214 08/02/15.PMH   Culture, respiratory (NON-Expectorated)     Status: None   Collection Time: 08/02/15  8:37 PM  Result Value Ref Range Status   Specimen Description SPUTUM  Final   Special Requests Normal Reflexed from [id number]  Final   Gram Stain   Final    FAIR SPECIMEN - 70-80% WBCS FEW WBC SEEN MANY GRAM POSITIVE COCCI    Culture NORMAL UPPER RESPIRATORY FLORA  Final   Report Status 08/05/2015 FINAL  Final  Body fluid culture     Status: None (Preliminary result)   Collection Time: 08/04/15  2:51 PM  Result Value Ref Range Status   Specimen Description PLEURAL  Final   Special Requests NONE  Final   Gram Stain PENDING  Incomplete   Culture NO GROWTH 1 DAY  Final   Report Status PENDING  Incomplete    Anti-infectives    Start     Dose/Rate Route Frequency Ordered Stop   08/04/15 1200  vancomycin (VANCOCIN) IVPB 750 mg/150 ml premix     750 mg 150 mL/hr over 60 Minutes Intravenous Every M-W-F (Hemodialysis) 08/03/15 1755     08/04/15 1000  levofloxacin (LEVAQUIN) tablet 500 mg  Status:  Discontinued     500 mg Oral Every 48 hours 08/02/15 1408 08/04/15 0746   08/04/15 0900  cefTRIAXone (ROCEPHIN) 1 g in dextrose 5 % 50 mL IVPB     1 g 100 mL/hr over 30 Minutes Intravenous Every 24 hours 08/04/15 0746 08/09/15 0859   08/03/15 1400  clindamycin (CLEOCIN) IVPB 600 mg     600 mg 100 mL/hr over 30 Minutes Intravenous 3 times per day 08/03/15 1217      08/03/15 1300  vancomycin (VANCOCIN) 1,500 mg in sodium chloride 0.9 % 500 mL IVPB     1,500 mg 250 mL/hr over 120 Minutes Intravenous  Once 08/03/15 1224 08/03/15 1625   08/02/15 1345  cefTRIAXone (ROCEPHIN) 1 g in dextrose 5 % 50 mL IVPB  Status:  Discontinued     1 g 100 mL/hr over 30 Minutes Intravenous Every 24 hours 08/02/15 1344 08/02/15 1410   08/02/15 1345  levofloxacin (LEVAQUIN) tablet 500 mg  Status:  Discontinued     500 mg Oral Daily 08/02/15 1344 08/02/15 1408   08/02/15 1045  levofloxacin (LEVAQUIN) IVPB 750 mg     750 mg 100 mL/hr over 90 Minutes Intravenous  Once 08/02/15 1038 08/02/15 1332      Assessment: 81 y/o F with RLL HCAP and h/o PCN allergy (rash) ordered empiric abx with clindamycin, ceftriaxone, and vancomycin. Sputum growing GPCs, which is covered by vancomycin.   Goal of Therapy:  Pre-HD vancomycin level: 15-25  Plan:  Most recent vancomycin dose was documented as given at 1330 on 8/22 prior to HD at 1700. If this documentation is accurate, vancomycin was likely removed during HD session. Random vancomycin level ordered today.   Depending on level result, will adjust accordingly.  Will f/u dialysis plans/schedule and plan on checking a HD trough with the third session Monday if patient is still admitted.   Vena Rua 08/06/2015,8:12 AM   Addendum: Random vancomycin trough=20 Continue with vancomycin 750mg  IV after HD sessions MWF (next session today 8/24).  Rescheduled next vancomycin trough for Monday 8/29 immediately before HD session, if patient is still admitted.

## 2015-08-07 LAB — CBC WITH DIFFERENTIAL/PLATELET
Basophils Absolute: 0.1 10*3/uL (ref 0–0.1)
Basophils Relative: 0 %
EOS PCT: 1 %
Eosinophils Absolute: 0.2 10*3/uL (ref 0–0.7)
HEMATOCRIT: 27.1 % — AB (ref 35.0–47.0)
Hemoglobin: 8.9 g/dL — ABNORMAL LOW (ref 12.0–16.0)
LYMPHS ABS: 1.5 10*3/uL (ref 1.0–3.6)
LYMPHS PCT: 11 %
MCH: 33.9 pg (ref 26.0–34.0)
MCHC: 32.8 g/dL (ref 32.0–36.0)
MCV: 103.3 fL — AB (ref 80.0–100.0)
MONO ABS: 1.1 10*3/uL — AB (ref 0.2–0.9)
Monocytes Relative: 9 %
NEUTROS ABS: 10.1 10*3/uL — AB (ref 1.4–6.5)
Neutrophils Relative %: 79 %
PLATELETS: 159 10*3/uL (ref 150–440)
RBC: 2.62 MIL/uL — AB (ref 3.80–5.20)
RDW: 14.8 % — AB (ref 11.5–14.5)
WBC: 12.9 10*3/uL — ABNORMAL HIGH (ref 3.6–11.0)

## 2015-08-07 LAB — MISC LABCORP TEST (SEND OUT): LABCORP TEST CODE: 101170

## 2015-08-07 LAB — CULTURE, BLOOD (ROUTINE X 2)
Culture: NO GROWTH
Culture: NO GROWTH

## 2015-08-07 LAB — CYTOLOGY - NON PAP

## 2015-08-07 LAB — BASIC METABOLIC PANEL
ANION GAP: 9 (ref 5–15)
BUN: 15 mg/dL (ref 6–20)
CO2: 32 mmol/L (ref 22–32)
Calcium: 7.9 mg/dL — ABNORMAL LOW (ref 8.9–10.3)
Chloride: 96 mmol/L — ABNORMAL LOW (ref 101–111)
Creatinine, Ser: 3.27 mg/dL — ABNORMAL HIGH (ref 0.44–1.00)
GFR calc Af Amer: 14 mL/min — ABNORMAL LOW (ref >60)
GFR, EST NON AFRICAN AMERICAN: 12 mL/min — AB (ref >60)
GLUCOSE: 186 mg/dL — AB (ref 65–99)
POTASSIUM: 4.3 mmol/L (ref 3.5–5.1)
Sodium: 137 mmol/L (ref 135–145)

## 2015-08-07 LAB — GLUCOSE, CAPILLARY
GLUCOSE-CAPILLARY: 162 mg/dL — AB (ref 65–99)
Glucose-Capillary: 152 mg/dL — ABNORMAL HIGH (ref 65–99)
Glucose-Capillary: 311 mg/dL — ABNORMAL HIGH (ref 65–99)
Glucose-Capillary: 189 mg/dL — ABNORMAL HIGH (ref 65–99)

## 2015-08-07 MED ORDER — IPRATROPIUM-ALBUTEROL 0.5-2.5 (3) MG/3ML IN SOLN
3.0000 mL | Freq: Four times a day (QID) | RESPIRATORY_TRACT | Status: DC
  Administered 2015-08-08 – 2015-08-11 (×12): 3 mL via RESPIRATORY_TRACT
  Filled 2015-08-07 (×14): qty 3

## 2015-08-07 MED ORDER — TIZANIDINE HCL 4 MG PO TABS
4.0000 mg | ORAL_TABLET | Freq: Two times a day (BID) | ORAL | Status: DC | PRN
  Administered 2015-08-07 – 2015-08-10 (×4): 4 mg via ORAL
  Filled 2015-08-07 (×4): qty 1

## 2015-08-07 MED ORDER — SODIUM CHLORIDE 0.9 % IJ SOLN
10.0000 mL | Freq: Two times a day (BID) | INTRAMUSCULAR | Status: DC
  Administered 2015-08-07 – 2015-08-08 (×4): 10 mL
  Administered 2015-08-09: 30 mL
  Administered 2015-08-09 – 2015-08-11 (×4): 10 mL

## 2015-08-07 MED ORDER — ALBUTEROL SULFATE (2.5 MG/3ML) 0.083% IN NEBU
2.5000 mg | INHALATION_SOLUTION | RESPIRATORY_TRACT | Status: DC | PRN
  Administered 2015-08-08: 2.5 mg via RESPIRATORY_TRACT
  Filled 2015-08-07: qty 3

## 2015-08-07 MED ORDER — SODIUM CHLORIDE 0.9 % IJ SOLN
10.0000 mL | INTRAMUSCULAR | Status: DC | PRN
  Administered 2015-08-11 (×2): 10 mL
  Filled 2015-08-07 (×2): qty 40

## 2015-08-07 NOTE — Care Management Important Message (Signed)
Important Message  Patient Details  Name: [name] MRN: [id number] Date of Birth: Jul 18, 1934   Medicare Important Message Given:  Yes-second notification given    Katrina Stack, RN 08/07/2015, 3:52 PM

## 2015-08-07 NOTE — Progress Notes (Signed)
Subjective:  Patient feels much better today overall Sitting up in the bed Still having coughing spells   Objective:  Vital signs in last 24 hours:  Temp:  [97 F (36.1 C)-98.3 F (36.8 C)] 98 F (36.7 C) (08/25 1131) Pulse Rate:  [79-108] 101 (08/25 1131) Resp:  [18-28] 18 (08/25 0532) BP: (97-138)/(37-81) 130/80 mmHg (08/25 1131) SpO2:  [97 %-100 %] 99 % (08/25 1131) Weight:  [95.9 kg (211 lb 6.7 oz)-96.9 kg (213 lb 10 oz)] 95.9 kg (211 lb 6.7 oz) (08/24 1710)  Weight change:  Filed Weights   08/04/15 1715 08/06/15 1254 08/06/15 1710  Weight: 101 kg (222 lb 10.6 oz) 96.9 kg (213 lb 10 oz) 95.9 kg (211 lb 6.7 oz)    Intake/Output: I/O last 3 completed shifts: In: 86 [IV Piggyback:50] Out: 1000 [Other:1000]   Intake/Output this shift:     Physical Exam: General: NAD, resting in bed  Head: Normocephalic, atraumatic. Moist oral mucosal membranes  Eyes: Anicteric  Neck: Supple, trachea midline  Lungs:  Bibasilar rales , Rt lower field rhonchi  Heart: Irregular, A Fib,   Abdomen:  Soft, nontender, obese, BS present  Extremities:  1+ peripheral edema.  Neurologic: Nonfocal, moving all four extremities  Skin: No lesions  Access: LUE AVF    Basic Metabolic Panel:  Recent Labs Lab 08/02/15 0947 08/03/15 0621 08/04/15 1735 08/05/15 0822 08/06/15 0500 08/07/15 0540  NA 138 134*  --  138 133* 137  K 4.0 4.8  --  4.8 4.5 4.3  CL 93* 95*  --  97* 93* 96*  CO2 35* 27  --  31 30 32  GLUCOSE 115* 165*  --  163* 176* 186*  BUN 14 20  --  18 27* 15  CREATININE 4.63* 5.15*  --  4.09* 4.73* 3.27*  CALCIUM 8.0* 7.5*  --  7.8* 7.7* 7.9*  PHOS  --   --  7.7*  --   --   --     Liver Function Tests:  Recent Labs Lab 08/02/15 0947 08/06/15 0500  AST 21 20  ALT 9* 10*  ALKPHOS 192* 159*  BILITOT 1.0 1.0  PROT 6.9 6.0*  ALBUMIN 2.6* 2.2*    Recent Labs Lab 08/02/15 0947  LIPASE 17*   No results for input(s): AMMONIA in the last 168 hours.  CBC:  Recent  Labs Lab 08/02/15 0947 08/03/15 0621 08/05/15 0822 08/06/15 0500 08/07/15 0540  WBC 15.1* 20.8* 15.2* 15.1* 12.9*  NEUTROABS 11.5*  --  12.1* 12.8* 10.1*  HGB 10.7* 11.3* 10.3* 9.3* 8.9*  HCT 32.9* 35.2 31.2* 27.9* 27.1*  MCV 103.3* 106.8* 104.1* 104.8* 103.3*  PLT 278 189 181 164 159    Cardiac Enzymes:  Recent Labs Lab 08/02/15 0947  TROPONINI 0.03    BNP: Invalid input(s): POCBNP  CBG:  Recent Labs Lab 08/06/15 1129 08/06/15 1759 08/06/15 2027 08/07/15 0745 08/07/15 1144  GLUCAP 151* 173* 159* 152* 311*    Microbiology: Results for orders placed or performed during the hospital encounter of 08/02/15  Blood culture (routine x 2)     Status: None   Collection Time: 08/02/15 11:59 AM  Result Value Ref Range Status   Specimen Description BLOOD LEFT ASSIST CONTROL  Final   Special Requests   Final    BOTTLES DRAWN AEROBIC AND ANAEROBIC  AER 6CC ANA 1CC   Culture NO GROWTH 5 DAYS  Final   Report Status 08/07/2015 FINAL  Final  Blood culture (routine x 2)  Status: None   Collection Time: 08/02/15 12:00 PM  Result Value Ref Range Status   Specimen Description BLOOD LEFT FOREARM  Final   Special Requests BOTTLES DRAWN AEROBIC AND ANAEROBIC  1CC  Final   Culture NO GROWTH 5 DAYS  Final   Report Status 08/07/2015 FINAL  Final  Culture, expectorated sputum-assessment     Status: None   Collection Time: 08/02/15  8:37 PM  Result Value Ref Range Status   Specimen Description SPUTUM  Final   Special Requests Normal  Final   Sputum evaluation THIS SPECIMEN IS ACCEPTABLE FOR SPUTUM CULTURE  Final   Report Status 08/04/2015 FINAL  Final  MRSA PCR Screening     Status: Abnormal   Collection Time: 08/02/15  8:37 PM  Result Value Ref Range Status   MRSA by PCR POSITIVE (A) NEGATIVE Final    Comment:        The GeneXpert MRSA Assay (FDA approved for NASAL specimens only), is one component of a comprehensive MRSA colonization surveillance program. It is  not intended to diagnose MRSA infection nor to guide or monitor treatment for MRSA infections. CRITICAL RESULT CALLED TO, READ BACK BY AND VERIFIED WITH: MARCEL TURNER AT 2214 08/02/15.PMH   Culture, respiratory (NON-Expectorated)     Status: None   Collection Time: 08/02/15  8:37 PM  Result Value Ref Range Status   Specimen Description SPUTUM  Final   Special Requests Normal Reflexed from [id number]  Final   Gram Stain   Final    FAIR SPECIMEN - 70-80% WBCS FEW WBC SEEN MANY GRAM POSITIVE COCCI    Culture NORMAL UPPER RESPIRATORY FLORA  Final   Report Status 08/05/2015 FINAL  Final  Body fluid culture     Status: None (Preliminary result)   Collection Time: 08/04/15  2:51 PM  Result Value Ref Range Status   Specimen Description PLEURAL  Final   Special Requests NONE  Final   Gram Stain NO WBC SEEN NO ORGANISMS SEEN   Final   Culture NO GROWTH 3 DAYS  Final   Report Status PENDING  Incomplete    Coagulation Studies: No results for input(s): LABPROT, INR in the last 72 hours.  Urinalysis: No results for input(s): COLORURINE, LABSPEC, PHURINE, GLUCOSEU, HGBUR, BILIRUBINUR, KETONESUR, PROTEINUR, UROBILINOGEN, NITRITE, LEUKOCYTESUR in the last 72 hours.  Invalid input(s): APPERANCEUR    Imaging: Dg Chest 1 View  08/05/2015   CLINICAL DATA:  Right IJ placed  EXAM: CHEST  1 VIEW  COMPARISON:  08/05/2015  FINDINGS: There is a right IJ catheter with tip at the cavoatrial junction. There is moderate cardiac enlargement. Large right pleural effusion is similar in volume to the previous exam. The left lung is relatively clear.  IMPRESSION: Tip of the right IJ catheter is in the cavoatrial junction.  Large right pleural effusion as before.   Electronically Signed   By: Kerby Moors M.D.   On: 08/05/2015 19:51   Dg Chest Port 1 View  08/05/2015   CLINICAL DATA:  PICC line placement.  EXAM: PORTABLE CHEST - 1 VIEW  COMPARISON:  08/04/2015.  FINDINGS: A right IJ PICC line is noted with  tip overlying the superior cavoatrial junction.  Slightly increased right lower lung atelectasis/right pleural effusion noted.  Cardiomegaly and vascular congestion again noted as well as left basilar atelectasis/ airspace disease.  There is no evidence of pneumothorax.  IMPRESSION: Right central venous catheter/PICC line with tip overlying the superior cavoatrial junction.  Slightly increased right lower  lung atelectasis/right pleural effusion.  Unchanged cardiomegaly, pulmonary vascular congestion and left basilar atelectasis/ airspace disease   Electronically Signed   By: Margarette Canada M.D.   On: 08/05/2015 18:34     Medications:     . amiodarone  100 mg Oral Daily  . calcium acetate  1,334 mg Oral TID WC  . cefTRIAXone (ROCEPHIN)  IV  1 g Intravenous Q24H  . Chlorhexidine Gluconate Cloth  6 each Topical Q0600  . clindamycin (CLEOCIN) IV  600 mg Intravenous 3 times per day  . dextromethorphan  30 mg Oral BID   And  . guaiFENesin  600 mg Oral BID  . insulin aspart  0-5 Units Subcutaneous QHS  . insulin aspart  0-9 Units Subcutaneous TID WC  . ipratropium-albuterol  3 mL Nebulization Q4H  . mupirocin ointment  1 application Nasal BID  . pantoprazole  40 mg Oral BID  . sevelamer carbonate  1,600 mg Oral TID WC  . sodium chloride  10-40 mL Intracatheter Q12H  . vancomycin  750 mg Intravenous Q M,W,F-HD   acetaminophen **OR** acetaminophen, alteplase, benzonatate, docusate sodium, heparin, lidocaine (PF), lidocaine-prilocaine, ondansetron (ZOFRAN) IV, pentafluoroprop-tetrafluoroeth, senna, sodium chloride  Assessment/ Plan:  81 y.o. female Diabetes mellitus type 2 x 20 years, ESRD ON HD, HTN ,Psoriasis, Sleep apnea, CPAP, Recurrent vertigo, Essential tremor, Bilateral knee replacement, ACD, Secondary hyperparathyroidism, Cholcystecomy, Hysterectomy, Bilateral Tubal ligation, Carpal tunnel release, left radiocephalic AVF 02/16/09, admission for chest pain at [facility] 9/13, admission to [facility] for Afib  10/14, on chronic anticoagulation, left hip fracture, left ureteral stones, admission for pneumonia 07/2015. left main and right coronary artery atherosclerosis. Liver cirrhosis. Cardiomegaly with biatrial dilatation, mitral valve calcification.    1.  ESRD on HD MWF:  - Routine dialysis tomorrow   2.  Hypotension:   - likely due to arrythmia, sepsis. Improved at present - continue to monitor  3.  Anemia of CKD:    - epogen with dialysis  4.  SHPTH:  - monitor Phos this hosptialization - start phoslo  5.  Shortness of breath. Patient's CT scan shows consolidation in the lower lobes of the lungs bilaterally especially right. Concerning for recurrent aspiration pneumonia. It also shows left main and right coronary artery atherosclerosis. Liver cirrhosis. Cardiomegaly with biatrial dilatation, mitral valve calcification.   LOS: 5 Colleen Donahoe 8/25/201612:48 PM

## 2015-08-07 NOTE — Progress Notes (Signed)
Patient ID: [name], female   DOB: Mar 21, 1934, 81 y.o.   MRN: [id number] Patient ID: [name], female   DOB: 03-20-1934, 81 y.o.   MRN: [id number] Patient ID: [name], female   DOB: 1934-09-22, 81 y.o.   MRN: [id number] [name] is a 81 y.o. female   SUBJECTIVE:  Patient admitted with progressive cough, dyspnea, tachycardia and hypotension. Post ICU, still mild cough, off IV pressors, blood pressure stable. Still coughing up brownish greenish sputum ______________________________________________________________________  ROS: Review of systems is unremarkable for any active cardiac,respiratory, GI, GU, hematologic, neurologic or psychiatric systems, 10 systems reviewed.  Marland Kitchen amiodarone  100 mg Oral Daily  . calcium acetate  1,334 mg Oral TID WC  . cefTRIAXone (ROCEPHIN)  IV  1 g Intravenous Q24H  . Chlorhexidine Gluconate Cloth  6 each Topical Q0600  . clindamycin (CLEOCIN) IV  600 mg Intravenous 3 times per day  . dextromethorphan  30 mg Oral BID   And  . guaiFENesin  600 mg Oral BID  . insulin aspart  0-5 Units Subcutaneous QHS  . insulin aspart  0-9 Units Subcutaneous TID WC  . ipratropium-albuterol  3 mL Nebulization Q4H  . mupirocin ointment  1 application Nasal BID  . pantoprazole  40 mg Oral BID  . sevelamer carbonate  1,600 mg Oral TID WC  . sodium chloride  10-40 mL Intracatheter Q12H  . vancomycin  750 mg Intravenous Q M,W,F-HD   acetaminophen **OR** acetaminophen, alteplase, benzonatate, docusate sodium, heparin, lidocaine (PF), lidocaine-prilocaine, ondansetron (ZOFRAN) IV, pentafluoroprop-tetrafluoroeth, senna, sodium chloride   Past Medical History  Diagnosis Date  . Skin cancer     Resected from legs  . Renal insufficiency     Patient is on dialysis and normal days are M,W and F.  . Diabetes mellitus without complication     Patient takes Insulin  . ESRD (end stage renal disease)     Monday, wednesday, Friday DIalysis  . HTN (hypertension)    . Essential tremor   . OSA (obstructive sleep apnea)   . A-fib     not on anticoagulation  . Bilateral lower extremity edema   . Osteoarthritis     Past Surgical History  Procedure Laterality Date  . Abdominal hysterectomy    . Cholecystectomy    . Femur fracture surgery      left femur  . Tubal ligation    . Ankle fracture surgery      right ankle  . Neuroplasty / transposition median nerve at carpal tunnel bilateral    . Knee arthroplasty      PHYSICAL EXAM:  BP 118/56 mmHg  Pulse 99  Temp(Src) 97.5 F (36.4 C) (Oral)  Resp 18  Ht 5\' 1"  (1.549 m)  Wt 95.9 kg (211 lb 6.7 oz)  BMI 39.97 kg/m2  SpO2 97%  Wt Readings from Last 3 Encounters:  08/06/15 95.9 kg (211 lb 6.7 oz)           BP Readings from Last 3 Encounters:  08/07/15 118/56    Constitutional: NAD Neck: supple, no thyromegaly Respiratory: Rhonchi throughout Cardiovascular: RRR, no murmur, no gallop Abdomen: soft, good BS, nontender Extremities: no edema Neuro: alert and oriented, no focal motor or sensory deficits  ASSESSMENT/PLAN:  Labs and imaging studies were reviewed  Progressive complicated pneumonia-is been on Levaquin several times, on Rocephin/clindamycin/vancomycin, sputum culture respiratory flora, likely staph, very slow progress, white count better Right effusion-post thoracentesis, cultures negative  Hypotension-resolved, off of IV  pressors A. fib-back to normal sinus rhythm, amiodarone, echo normal ejection fraction Diabetes-sliding scale ESRD-dialysis Monday Wednesday Friday OSA-continue CPAP at bedtime Will need a full 7-10 days of IV antibiotics since failure of multiple outpatient antibiotics with complicated pneumonia.  Skilled nursing Monday

## 2015-08-07 NOTE — Progress Notes (Signed)
Physical Therapy Treatment Patient Details Name: [name] MRN: [id number] DOB: October 13, 1934 Today's Date: 08/07/2015    History of Present Illness Pt admitted to hospital with SOB/gross weakness/emesis, diagnosed as sepsis secondary to pneumonial bronchitis.  Pt presents with hypotensive symptoms that need to be closely mointored.     PT Comments    Pt demonstrates improving LE strength today with therapy. Vitals continuously monitored and HR remains between 100-115. SaO2 remains >90% on 3L/min O2 and orthostatics negative during position changes. No reported dizziness during mobility. Pt able to transfer from bed to recliner today but no true ambulation performed due to concerns for safety. Pt will need SNF placement at discharge to return to prior level of function. Pt will benefit from skilled PT services to address deficits in strength, balance, and mobility in order to return to full function at home.    Follow Up Recommendations  SNF     Equipment Recommendations  Rolling walker with 5" wheels    Recommendations for Other Services       Precautions / Restrictions Precautions Precautions: Fall Restrictions Weight Bearing Restrictions: No    Mobility  Bed Mobility Overal bed mobility: Needs Assistance Bed Mobility: Supine to Sit     Supine to sit: Min assist     General bed mobility comments: HOB elevated and bed rails required to assist. Pt requires assist to get from sidelying to sitting. Orthostatic vitals performed with all position changes  Transfers Overall transfer level: Needs assistance Equipment used: Rolling walker (2 wheeled) Transfers: Sit to/from Stand Sit to Stand: Min assist         General transfer comment: Pt demonstrates decreased LE power and strength but good weight shifting. She requires cues for safe hand placement during both phases of the transfer  Ambulation/Gait             General Gait Details: Pt able to take short  shuffling steps to transfer from bed to recliner but unsafe to attempt true ambulation at this time   Stairs            Wheelchair Mobility    Modified Rankin (Stroke Patients Only)       Balance Overall balance assessment: Needs assistance Sitting-balance support: No upper extremity supported;Feet supported Sitting balance-Leahy Scale: Good Sitting balance - Comments: Orthostatics negative during supine to sit   Standing balance support: Bilateral upper extremity supported Standing balance-Leahy Scale: Poor Standing balance comment: Pt with notable LE weakness and RLE knee hyperextension. Able to maintain standing balance with rolling walker for >3 minutes                    Cognition Arousal/Alertness: Awake/alert Behavior During Therapy: WFL for tasks assessed/performed Overall Cognitive Status: Within Functional Limits for tasks assessed                      Exercises General Exercises - Lower Extremity Ankle Circles/Pumps: Strengthening;Both;Supine;15 reps Quad Sets: Strengthening;Both;15 reps;Supine Gluteal Sets: Strengthening;Both;15 reps;Supine Short Arc Quad: Strengthening;15 reps;Both;Supine Long Arc Quad: Strengthening;Both;15 reps;Seated Heel Slides: Strengthening;Both;15 reps;Supine Hip ABduction/ADduction: Strengthening;Both;15 reps;Supine (Also performed x 15 seated) Straight Leg Raises: Strengthening;Both;15 reps;Supine Hip Flexion/Marching: Strengthening;Both;Seated;15 reps    General Comments        Pertinent Vitals/Pain Pain Assessment: No/denies pain    Home Living                      Prior Function  PT Goals (current goals can now be found in the care plan section) Acute Rehab PT Goals Patient Stated Goal: to return home PT Goal Formulation: With patient/family Time For Goal Achievement: 08/19/15 Potential to Achieve Goals: Good Progress towards PT goals: Progressing toward goals    Frequency   Min 2X/week    PT Plan Current plan remains appropriate    Co-evaluation             End of Session Equipment Utilized During Treatment: Gait belt;Oxygen Activity Tolerance: Patient tolerated treatment well;Patient limited by fatigue Patient left: in chair;with call bell/phone within reach;with chair alarm set;with family/visitor present     Time: 1110-     Charges:  $Therapeutic Exercise: 23-37 mins $Therapeutic Activity: 8-22 mins                    G Codes:      Lyndel Safe Huprich PT, DPT   Huprich,Jason 08/07/2015, 12:09 PM

## 2015-08-07 NOTE — Progress Notes (Signed)
Patient  Work with PT today,  Assisted OOB to chair at this time, mood calm, pt SOB with excerption, remains on oxygen 2L [place] will continue to monitor.

## 2015-08-07 NOTE — Care Management (Signed)
Attending is documenting for skilled nursing Monday.  CM discussed this paln on 8/24.  Patient was to have have a fistulagram 8/24 (if stable).  Do not see documentation that this procedure was performed.  Discussed during progression

## 2015-08-07 NOTE — Op Note (Signed)
[facility]    OPERATIVE NOTE   PROCEDURE: 1.   Left radiocephalic arteriovenous fistula cannulation under ultrasound guidance 2.   left arm fistulagram including central venogram 3.   Percutaneous transluminal angioplasty of mid forearm cephalic vein with 7 mm diameter by 6 cm length Lutonix drug-coated angioplasty balloon  PRE-OPERATIVE DIAGNOSIS: 1. ESRD 2. Poorly functional left radiocephalic AVF  POST-OPERATIVE DIAGNOSIS: same as above   SURGEON: Leotis Pain, MD  ANESTHESIA: local with MCS  ESTIMATED BLOOD LOSS: Minimal  FINDING(S): 1. Moderate stenosis at the proximal edge of the previously placed stent in the cephalic vein in the forearm improved with angioplasty  SPECIMEN(S):  None  CONTRAST: 30 cc  INDICATIONS: [name] is a 81 y.o. female who presents with malfunctioning  left radiocephalic arteriovenous fistula.  The patient is scheduled for  left arm fistulagram.  The patient is aware the risks include but are not limited to: bleeding, infection, thrombosis of the cannulated access, and possible anaphylactic reaction to the contrast.  The patient is aware of the risks of the procedure and elects to proceed forward.  DESCRIPTION: After full informed written consent was obtained, the patient was brought back to the angiography suite and placed supine upon the angiography table.  The patient was connected to monitoring equipment.  The  left arm was prepped and draped in the standard fashion for a percutaneous access intervention.  Under ultrasound guidance, the  arterial access site of the left radiocephalic arteriovenous fistula was cannulated with a micropuncture needle under direct ultrasound guidance in an antegrade fashion and a permanent image was performed.  The microwire was advanced into the fistula and the needle was exchanged for the a microsheath.  I then upsized to a 6 Fr Sheath and imaging was performed.  Hand injections were  completed to image the access including the central venous system. This demonstrated some tortuosity and about a 60% stenosis just at the proximal end of the previously placed stent in the cephalic vein in the forearm but no other flow-limiting stenoses.  Based on the images, this patient will need angioplasty of this area. I then gave the patient 3000 units of intravenous heparin.  I then crossed the stenosis with a Magic Tourqe wire.  Based on the imaging, a 7 mm x 6 cm  Lutonix drug-coated angioplasty balloon was selected.  The balloon was centered around the forearm cephalic vein in-stent stenosis and inflated to 12 ATM for 1 minute(s).  On completion imaging, a 15-20 % residual stenosis was present.     Based on the completion imaging, no further intervention is necessary.  The wire and balloon were removed from the sheath.  A 4-0 Monocryl purse-string suture was sewn around the sheath.  The sheath was removed while tying down the suture.  A sterile bandage was applied to the puncture site.  COMPLICATIONS: None  CONDITION: Stable   [name]  08/07/2015 2:48 PM

## 2015-08-08 ENCOUNTER — Encounter: Payer: Self-pay | Admitting: Vascular Surgery

## 2015-08-08 LAB — BODY FLUID CULTURE
Culture: NO GROWTH
GRAM STAIN: NONE SEEN

## 2015-08-08 LAB — BASIC METABOLIC PANEL
Anion gap: 10 (ref 5–15)
BUN: 19 mg/dL (ref 6–20)
CALCIUM: 8.1 mg/dL — AB (ref 8.9–10.3)
CO2: 29 mmol/L (ref 22–32)
Chloride: 93 mmol/L — ABNORMAL LOW (ref 101–111)
Creatinine, Ser: 4.25 mg/dL — ABNORMAL HIGH (ref 0.44–1.00)
GFR calc Af Amer: 11 mL/min — ABNORMAL LOW (ref >60)
GFR, EST NON AFRICAN AMERICAN: 9 mL/min — AB (ref >60)
GLUCOSE: 205 mg/dL — AB (ref 65–99)
Potassium: 5 mmol/L (ref 3.5–5.1)
Sodium: 132 mmol/L — ABNORMAL LOW (ref 135–145)

## 2015-08-08 LAB — CBC WITH DIFFERENTIAL/PLATELET
Basophils Absolute: 0.1 10*3/uL (ref 0–0.1)
Basophils Relative: 1 %
EOS PCT: 2 %
Eosinophils Absolute: 0.3 10*3/uL (ref 0–0.7)
HEMATOCRIT: 27.4 % — AB (ref 35.0–47.0)
Hemoglobin: 9 g/dL — ABNORMAL LOW (ref 12.0–16.0)
LYMPHS ABS: 1.7 10*3/uL (ref 1.0–3.6)
LYMPHS PCT: 13 %
MCH: 34 pg (ref 26.0–34.0)
MCHC: 32.8 g/dL (ref 32.0–36.0)
MCV: 103.5 fL — AB (ref 80.0–100.0)
MONO ABS: 1.4 10*3/uL — AB (ref 0.2–0.9)
MONOS PCT: 11 %
NEUTROS ABS: 10 10*3/uL — AB (ref 1.4–6.5)
Neutrophils Relative %: 73 %
PLATELETS: 148 10*3/uL — AB (ref 150–440)
RBC: 2.65 MIL/uL — ABNORMAL LOW (ref 3.80–5.20)
RDW: 15.2 % — AB (ref 11.5–14.5)
WBC: 13.4 10*3/uL — ABNORMAL HIGH (ref 3.6–11.0)

## 2015-08-08 LAB — GLUCOSE, CAPILLARY
GLUCOSE-CAPILLARY: 217 mg/dL — AB (ref 65–99)
GLUCOSE-CAPILLARY: 212 mg/dL — AB (ref 65–99)
Glucose-Capillary: 235 mg/dL — ABNORMAL HIGH (ref 65–99)
Glucose-Capillary: 190 mg/dL — ABNORMAL HIGH (ref 65–99)

## 2015-08-08 MED ORDER — CALCIUM ACETATE (PHOS BINDER) 667 MG PO CAPS: 1334.0000 mg | ORAL_CAPSULE | Freq: Three times a day (TID) | ORAL | Status: DC

## 2015-08-08 MED ORDER — DOCUSATE SODIUM 100 MG PO CAPS: 100.0000 mg | ORAL_CAPSULE | Freq: Two times a day (BID) | ORAL | Status: DC | PRN

## 2015-08-08 MED ORDER — BENZONATATE 100 MG PO CAPS: 100.0000 mg | ORAL_CAPSULE | Freq: Three times a day (TID) | ORAL | Status: DC | PRN

## 2015-08-08 MED ORDER — IPRATROPIUM-ALBUTEROL 0.5-2.5 (3) MG/3ML IN SOLN: 3.0000 mL | Freq: Four times a day (QID) | RESPIRATORY_TRACT | Status: DC

## 2015-08-08 MED ORDER — CLINDAMYCIN HCL 300 MG PO CAPS: 300.0000 mg | ORAL_CAPSULE | Freq: Three times a day (TID) | ORAL | Status: DC

## 2015-08-08 MED ORDER — ACETAMINOPHEN 325 MG PO TABS: 650.0000 mg | ORAL_TABLET | Freq: Four times a day (QID) | ORAL | Status: DC | PRN

## 2015-08-08 MED ORDER — VANCOMYCIN HCL IN DEXTROSE 750-5 MG/150ML-% IV SOLN: 750.0000 mg | INTRAVENOUS | Status: DC

## 2015-08-08 MED ORDER — AMIODARONE HCL 100 MG PO TABS: 100.0000 mg | ORAL_TABLET | Freq: Every day | ORAL | Status: DC

## 2015-08-08 MED ORDER — SEVELAMER CARBONATE 800 MG PO TABS: 1600.0000 mg | ORAL_TABLET | Freq: Three times a day (TID) | ORAL | Status: DC

## 2015-08-08 MED ORDER — GUAIFENESIN ER 600 MG PO TB12: 600.0000 mg | ORAL_TABLET | Freq: Two times a day (BID) | ORAL | Status: DC

## 2015-08-08 MED ORDER — ONDANSETRON HCL 4 MG/2ML IJ SOLN: 4.0000 mg | Freq: Four times a day (QID) | INTRAMUSCULAR | Status: DC | PRN

## 2015-08-08 MED ORDER — HYDROCODONE-ACETAMINOPHEN 5-325 MG PO TABS: 1.0000 | ORAL_TABLET | Freq: Four times a day (QID) | ORAL | Status: DC | PRN

## 2015-08-08 MED ORDER — ALBUTEROL SULFATE (2.5 MG/3ML) 0.083% IN NEBU: 2.5000 mg | INHALATION_SOLUTION | RESPIRATORY_TRACT | Status: DC | PRN

## 2015-08-08 MED ORDER — DEXTROMETHORPHAN POLISTIREX ER 30 MG/5ML PO SUER: 30.0000 mg | Freq: Two times a day (BID) | ORAL | Status: DC

## 2015-08-08 MED ORDER — DEXTROSE 5 % IV SOLN: 1.0000 g | INTRAVENOUS | Status: DC

## 2015-08-08 NOTE — Progress Notes (Addendum)
Informed by social worker, Caryl Pina, that [facility] is unable to take pt with Triple lumen IJ.   Orders received from Dr. Ola Spurr for PICC line placement tomorrow then plan for dc to [facility]. Husband made aware of plan.

## 2015-08-08 NOTE — Progress Notes (Signed)
Post hd tx 

## 2015-08-08 NOTE — Progress Notes (Signed)
ANTIBIOTIC CONSULT NOTE - FOLLOW UP  Pharmacy Consult for Vancomyicn Indication: pneumonia   Vancomycin 750mg  IV after HD -- HCAP--Day 4 (start date 8/21) Clindamycin 600mg  IV q8hrs--HCAP-- Day 4 (start 8/21) Ceftriaxone 1g IV q24 hrs-- HCAP-- Day 3 (start 8/22)   Allergies  Allergen Reactions  . Angiotensin Receptor Blockers     Other reaction(s): Other (See Comments) hyperkalemia  . Penicillins Rash    Patient Measurements: Height: 5\' 1"  (154.9 cm) Weight: 211 lb 6.7 oz (95.9 kg) IBW/kg (Calculated) : 47.8 Adjusted Body Weight: 67.4 kg  Vital Signs: Temp: 97.7 F (36.5 C) (08/26 0834) Temp Source: Oral (08/26 0834) BP: 107/58 mmHg (08/26 0834) Pulse Rate: 96 (08/26 0834)  Labs:  Recent Labs  08/06/15 0500 08/07/15 0540  WBC 15.1* 12.9*  HGB 9.3* 8.9*  PLT 164 159  CREATININE 4.73* 3.27*   Estimated Creatinine Clearance: 14.8 mL/min (by C-G formula based on Cr of 3.27).  Microbiology: Recent Results (from the past 720 hour(s))  Blood culture (routine x 2)     Status: None   Collection Time: 08/02/15 11:59 AM  Result Value Ref Range Status   Specimen Description BLOOD LEFT ASSIST CONTROL  Final   Special Requests   Final    BOTTLES DRAWN AEROBIC AND ANAEROBIC  AER 6CC ANA [place]   Culture NO GROWTH 5 DAYS  Final   Report Status 08/07/2015 FINAL  Final  Blood culture (routine x 2)     Status: None   Collection Time: 08/02/15 12:00 PM  Result Value Ref Range Status   Specimen Description BLOOD LEFT FOREARM  Final   Special Requests BOTTLES DRAWN AEROBIC AND ANAEROBIC  1CC  Final   Culture NO GROWTH 5 DAYS  Final   Report Status 08/07/2015 FINAL  Final  Culture, expectorated sputum-assessment     Status: None   Collection Time: 08/02/15  8:37 PM  Result Value Ref Range Status   Specimen Description SPUTUM  Final   Special Requests Normal  Final   Sputum evaluation THIS SPECIMEN IS ACCEPTABLE FOR SPUTUM CULTURE  Final   Report Status 08/04/2015 FINAL  Final   MRSA PCR Screening     Status: Abnormal   Collection Time: 08/02/15  8:37 PM  Result Value Ref Range Status   MRSA by PCR POSITIVE (A) NEGATIVE Final    Comment:        The GeneXpert MRSA Assay (FDA approved for NASAL specimens only), is one component of a comprehensive MRSA colonization surveillance program. It is not intended to diagnose MRSA infection nor to guide or monitor treatment for MRSA infections. CRITICAL RESULT CALLED TO, READ BACK BY AND VERIFIED WITH: MARCEL TURNER AT 2214 08/02/15.PMH   Culture, respiratory (NON-Expectorated)     Status: None   Collection Time: 08/02/15  8:37 PM  Result Value Ref Range Status   Specimen Description SPUTUM  Final   Special Requests Normal Reflexed from [id number]  Final   Gram Stain   Final    FAIR SPECIMEN - 70-80% WBCS FEW WBC SEEN MANY GRAM POSITIVE COCCI    Culture NORMAL UPPER RESPIRATORY FLORA  Final   Report Status 08/05/2015 FINAL  Final  Body fluid culture     Status: None (Preliminary result)   Collection Time: 08/04/15  2:51 PM  Result Value Ref Range Status   Specimen Description PLEURAL  Final   Special Requests NONE  Final   Gram Stain NO WBC SEEN NO ORGANISMS SEEN   Final   Culture  NO GROWTH 3 DAYS  Final   Report Status PENDING  Incomplete    Anti-infectives    Start     Dose/Rate Route Frequency Ordered Stop   08/06/15 2000  vancomycin (VANCOCIN) IVPB 750 mg/150 ml premix  Status:  Discontinued     750 mg 150 mL/hr over 60 Minutes Intravenous Every M-W-F (Hemodialysis) 08/06/15 0814 08/06/15 0816   08/06/15 1800  vancomycin (VANCOCIN) IVPB 750 mg/150 ml premix    Comments:  Dialysis RN to give after dialysis   750 mg 150 mL/hr over 60 Minutes Intravenous Every M-W-F (Hemodialysis) 08/06/15 0816     08/06/15 0906  clindamycin (CLEOCIN) 300 MG/50ML IVPB    Comments:  DONAHUE, ROBIN: cabinet override      08/06/15 0906 08/06/15 0910   08/04/15 1200  vancomycin (VANCOCIN) IVPB 750 mg/150 ml premix  Status:   Discontinued     750 mg 150 mL/hr over 60 Minutes Intravenous Every M-W-F (Hemodialysis) 08/03/15 1755 08/06/15 0814   08/04/15 1000  levofloxacin (LEVAQUIN) tablet 500 mg  Status:  Discontinued     500 mg Oral Every 48 hours 08/02/15 1408 08/04/15 0746   08/04/15 0900  cefTRIAXone (ROCEPHIN) 1 g in dextrose 5 % 50 mL IVPB     1 g 100 mL/hr over 30 Minutes Intravenous Every 24 hours 08/04/15 0746 08/09/15 0859   08/03/15 1400  clindamycin (CLEOCIN) IVPB 600 mg     600 mg 100 mL/hr over 30 Minutes Intravenous 3 times per day 08/03/15 1217     08/03/15 1300  vancomycin (VANCOCIN) 1,500 mg in sodium chloride 0.9 % 500 mL IVPB     1,500 mg 250 mL/hr over 120 Minutes Intravenous  Once 08/03/15 1224 08/03/15 1625   08/02/15 1345  cefTRIAXone (ROCEPHIN) 1 g in dextrose 5 % 50 mL IVPB  Status:  Discontinued     1 g 100 mL/hr over 30 Minutes Intravenous Every 24 hours 08/02/15 1344 08/02/15 1410   08/02/15 1345  levofloxacin (LEVAQUIN) tablet 500 mg  Status:  Discontinued     500 mg Oral Daily 08/02/15 1344 08/02/15 1408   08/02/15 1045  levofloxacin (LEVAQUIN) IVPB 750 mg     750 mg 100 mL/hr over 90 Minutes Intravenous  Once 08/02/15 1038 08/02/15 1332      Assessment: 81 y/o F with RLL HCAP and h/o PCN allergy (rash) ordered empiric abx with clindamycin, ceftriaxone, and vancomycin. Sputum growing GPCs, which is covered by vancomycin.   Goal of Therapy:  Pre-HD vancomycin level: 15-25  Plan:  Random vancomycin trough 8/24= 20 Continue with vancomycin 750mg  IV after HD sessions (last session 8/24).  Next vancomycin trough for Monday 8/29 immediately before HD session, if patient is still admitted.  Depending on level result, will adjust accordingly.  Will f/u dialysis plans/schedule and plan on checking a HD trough with the third session Monday if patient is still admitted.   Vena Rua 08/08/2015,8:36 AM

## 2015-08-08 NOTE — Progress Notes (Signed)
Subjective:  Patient feels much better today overall Sitting up in the bed, eating breakfast Still having coughing spells but they are better   Objective:  Vital signs in last 24 hours:  Temp:  [96.9 F (36.1 C)-97.7 F (36.5 C)] 97.7 F (36.5 C) (08/26 0834) Pulse Rate:  [88-96] 96 (08/26 0834) Resp:  [19-21] 20 (08/26 0834) BP: (104-147)/(51-96) 107/58 mmHg (08/26 0834) SpO2:  [92 %-100 %] 99 % (08/26 0834) FiO2 (%):  [32 %] 32 % (08/26 0820)  Weight change:  Filed Weights   08/04/15 1715 08/06/15 1254 08/06/15 1710  Weight: 101 kg (222 lb 10.6 oz) 96.9 kg (213 lb 10 oz) 95.9 kg (211 lb 6.7 oz)    Intake/Output:     Intake/Output this shift:     Physical Exam: General: NAD, resting in bed  Head: Normocephalic, atraumatic. Moist oral mucosal membranes  Eyes: Anicteric  Neck: Supple, trachea midline  Lungs:   mostly clear today, scattered rhonchi   Heart: Irregular, A Fib,   Abdomen:  Soft, nontender, obese, BS present  Extremities:  1+ peripheral edema.  Neurologic: Nonfocal, moving all four extremities  Skin: No lesions  Access: LUE AVF    Basic Metabolic Panel:  Recent Labs Lab 08/03/15 0621 08/04/15 1735 08/05/15 0822 08/06/15 0500 08/07/15 0540 08/08/15 0900  NA 134*  --  138 133* 137 132*  K 4.8  --  4.8 4.5 4.3 5.0  CL 95*  --  97* 93* 96* 93*  CO2 27  --  31 30 32 29  GLUCOSE 165*  --  163* 176* 186* 205*  BUN 20  --  18 27* 15 19  CREATININE 5.15*  --  4.09* 4.73* 3.27* 4.25*  CALCIUM 7.5*  --  7.8* 7.7* 7.9* 8.1*  PHOS  --  7.7*  --   --   --   --     Liver Function Tests:  Recent Labs Lab 08/02/15 0947 08/06/15 0500  AST 21 20  ALT 9* 10*  ALKPHOS 192* 159*  BILITOT 1.0 1.0  PROT 6.9 6.0*  ALBUMIN 2.6* 2.2*    Recent Labs Lab 08/02/15 0947  LIPASE 17*   No results for input(s): AMMONIA in the last 168 hours.  CBC:  Recent Labs Lab 08/02/15 0947 08/03/15 0621 08/05/15 0822 08/06/15 0500 08/07/15 0540  08/08/15 0900  WBC 15.1* 20.8* 15.2* 15.1* 12.9* 13.4*  NEUTROABS 11.5*  --  12.1* 12.8* 10.1* 10.0*  HGB 10.7* 11.3* 10.3* 9.3* 8.9* 9.0*  HCT 32.9* 35.2 31.2* 27.9* 27.1* 27.4*  MCV 103.3* 106.8* 104.1* 104.8* 103.3* 103.5*  PLT 278 189 181 164 159 148*    Cardiac Enzymes:  Recent Labs Lab 08/02/15 0947  TROPONINI 0.03    BNP: Invalid input(s): POCBNP  CBG:  Recent Labs Lab 08/07/15 0745 08/07/15 1144 08/07/15 1637 08/07/15 1934 08/08/15 0800  GLUCAP 152* 311* 162* 189* 16*    Microbiology: Results for orders placed or performed during the hospital encounter of 08/02/15  Blood culture (routine x 2)     Status: None   Collection Time: 08/02/15 11:59 AM  Result Value Ref Range Status   Specimen Description BLOOD LEFT ASSIST CONTROL  Final   Special Requests   Final    BOTTLES DRAWN AEROBIC AND ANAEROBIC  AER 6CC ANA 1CC   Culture NO GROWTH 5 DAYS  Final   Report Status 08/07/2015 FINAL  Final  Blood culture (routine x 2)     Status: None   Collection  Time: 08/02/15 12:00 PM  Result Value Ref Range Status   Specimen Description BLOOD LEFT FOREARM  Final   Special Requests BOTTLES DRAWN AEROBIC AND ANAEROBIC  1CC  Final   Culture NO GROWTH 5 DAYS  Final   Report Status 08/07/2015 FINAL  Final  Culture, expectorated sputum-assessment     Status: None   Collection Time: 08/02/15  8:37 PM  Result Value Ref Range Status   Specimen Description SPUTUM  Final   Special Requests Normal  Final   Sputum evaluation THIS SPECIMEN IS ACCEPTABLE FOR SPUTUM CULTURE  Final   Report Status 08/04/2015 FINAL  Final  MRSA PCR Screening     Status: Abnormal   Collection Time: 08/02/15  8:37 PM  Result Value Ref Range Status   MRSA by PCR POSITIVE (A) NEGATIVE Final    Comment:        The GeneXpert MRSA Assay (FDA approved for NASAL specimens only), is one component of a comprehensive MRSA colonization surveillance program. It is not intended to diagnose MRSA infection  nor to guide or monitor treatment for MRSA infections. CRITICAL RESULT CALLED TO, READ BACK BY AND VERIFIED WITH: MARCEL TURNER AT 2214 08/02/15.PMH   Culture, respiratory (NON-Expectorated)     Status: None   Collection Time: 08/02/15  8:37 PM  Result Value Ref Range Status   Specimen Description SPUTUM  Final   Special Requests Normal Reflexed from [id number]  Final   Gram Stain   Final    FAIR SPECIMEN - 70-80% WBCS FEW WBC SEEN MANY GRAM POSITIVE COCCI    Culture NORMAL UPPER RESPIRATORY FLORA  Final   Report Status 08/05/2015 FINAL  Final  Body fluid culture     Status: None   Collection Time: 08/04/15  2:51 PM  Result Value Ref Range Status   Specimen Description PLEURAL  Final   Special Requests NONE  Final   Gram Stain NO WBC SEEN NO ORGANISMS SEEN   Final   Culture NO GROWTH 4 DAYS  Final   Report Status 08/08/2015 FINAL  Final    Coagulation Studies: No results for input(s): LABPROT, INR in the last 72 hours.  Urinalysis: No results for input(s): COLORURINE, LABSPEC, PHURINE, GLUCOSEU, HGBUR, BILIRUBINUR, KETONESUR, PROTEINUR, UROBILINOGEN, NITRITE, LEUKOCYTESUR in the last 72 hours.  Invalid input(s): APPERANCEUR    Imaging: No results found.   Medications:     . amiodarone  100 mg Oral Daily  . calcium acetate  1,334 mg Oral TID WC  . cefTRIAXone (ROCEPHIN)  IV  1 g Intravenous Q24H  . clindamycin (CLEOCIN) IV  600 mg Intravenous 3 times per day  . dextromethorphan  30 mg Oral BID   And  . guaiFENesin  600 mg Oral BID  . insulin aspart  0-5 Units Subcutaneous QHS  . insulin aspart  0-9 Units Subcutaneous TID WC  . ipratropium-albuterol  3 mL Nebulization QID  . pantoprazole  40 mg Oral BID  . sevelamer carbonate  1,600 mg Oral TID WC  . sodium chloride  10-40 mL Intracatheter Q12H  . vancomycin  750 mg Intravenous Q M,W,F-HD   acetaminophen **OR** acetaminophen, albuterol, alteplase, benzonatate, docusate sodium, heparin, lidocaine (PF),  lidocaine-prilocaine, ondansetron (ZOFRAN) IV, pentafluoroprop-tetrafluoroeth, senna, sodium chloride, tiZANidine  Assessment/ Plan:  81 y.o. female Diabetes mellitus type 2 x 20 years, ESRD ON HD, HTN ,Psoriasis, Sleep apnea, CPAP, Recurrent vertigo, Essential tremor, Bilateral knee replacement, ACD, Secondary hyperparathyroidism, Cholcystecomy, Hysterectomy, Bilateral Tubal ligation, Carpal tunnel release, left radiocephalic AVF  05/14/09, admission for chest pain at [facility] 9/13, admission to [facility] for Afib 10/14, on chronic anticoagulation, left hip fracture, left ureteral stones, admission for pneumonia 07/2015. left main and right coronary artery atherosclerosis. Liver cirrhosis. Cardiomegaly with biatrial dilatation, mitral valve calcification.    1.  ESRD on HD MWF:  - Routine dialysis today  2.  Hypotension:   - likely due to arrythmia, sepsis. Improved at present - continue to monitor  3.  Anemia of CKD:    - epogen with dialysis  4.  SHPTH:  - monitor Phos this hosptialization - start phoslo  5.  Shortness of breath. Patient's CT scan shows consolidation in the lower lobes of the lungs bilaterally especially right. Concerning for recurrent aspiration pneumonia. It also shows left main and right coronary artery atherosclerosis. Liver cirrhosis. Cardiomegaly with biatrial dilatation, mitral valve calcification.   LOS: 6 [name] 8/26/201611:45 AM

## 2015-08-08 NOTE — Care Management (Signed)
Informed that [facility] after reviewing FL2 and discussing need of IV antibiotic therapy during bed search process and bed offer process, contacted unit late this pm and informed that could not accept patient without a PICC line. Facility that offered the bed "overlooked this item"  Patient's discharge delayed.  PICC will be prior to discharge. Patient has just returned to unit after dialysis treatment. Discharge has now been rescheduled for 8/27

## 2015-08-08 NOTE — Progress Notes (Signed)
Bladder scan performed per md orders, approx 157ml in bladder

## 2015-08-08 NOTE — Progress Notes (Signed)
Pt returned from dialysis, no distress on 3LO2 per [place].  Dressing to lt arm fistula dry and intact.

## 2015-08-08 NOTE — Progress Notes (Signed)
OT Cancellation Note  Patient Details Name: [name] MRN: [id number] DOB: July 22, 1934   Cancelled Treatment:    Reason Eval/Treat Not Completed: Patient at procedure or test/ unavailable (dyalisis)  Sharon Mt 08/08/2015, 3:37 PM

## 2015-08-08 NOTE — Clinical Social Work Note (Signed)
CSW was notified by facility that they cannot accept pt for IV abx with central line.  A pic line is needed.  CSW notified MD and will be placed for pic line tomorrow.  CSW will follow to DC.

## 2015-08-08 NOTE — Progress Notes (Signed)
Report called to Barbra Sarks, RN at [facility]

## 2015-08-08 NOTE — Clinical Social Work Note (Signed)
CSW notified pt, RN and facility that pt would DC to [facility] via EMS.  CSW signing off unless further needs arise.

## 2015-08-08 NOTE — Progress Notes (Signed)
HD tx start 

## 2015-08-08 NOTE — Progress Notes (Signed)
Pre-hd tx 

## 2015-08-08 NOTE — Care Management (Signed)
Paging rounding physician today to inform that patient has accepted a bed at [facility] and facility has assessed and able to provide the intravenous antibiotics.  Attending was informed of this yesterday but may have not reported this information to covering physician

## 2015-08-08 NOTE — Care Management (Signed)
Patient and her husband are in agreement with discahrge to skilled nursing today

## 2015-08-08 NOTE — Discharge Summary (Addendum)
Physician Discharge Summary  Patient ID: [name] MRN: [id number] DOB/AGE: 79-19-1935 79 y.o.  Admit date: 08/02/2015 Discharge date: 08/08/2015  Admission Diagnoses: Pneumonia  Discharge Diagnoses:  Active Problems:   Pneumonia   Pleural effusion   Acute respiratory failure   Cough   Atrial fibrillation   Community acquired pneumonia   Protein-calorie malnutrition, severe   Discharged Condition: fair  Hospital Course:  Admission Assessment [name] is a 81 y.o. female with a known history of end-stage renal disease on Monday Wednesday Friday dialysis, hypertension, atrial fibrillation not on anticoagulation, insulin-dependent diabetes mellitus, arthritis presents from home secondary to worsening cough and difficulty breathing.  Course Progressive complicated pneumonia - had  been on Levaquin several times without improvement, At admit treated with Rocephin/clindamycin/vancomycin and had improvement (she is penicillin allergic) - sputum culture respiratory flora - will plan a total 14 day course from 8/20 - stop date 08/16/15 - may pull neck line after abx on 08/16/15  Right effusion- seen by pulmonary  - status post thoracentesis, cultures negative   Hypotension-resolved, off of IV pressors  A. fib-back to normal sinus rhythm, amiodarone, echo normal ejection fraction. Dced BB  Diabetes-sliding scale  ESRD-dialysis Monday Wednesday Friday  OSA-continue CPAP at bedtime    Consults: cardiology, pulmonary/intensive care and nephrology  Discharge Exam: Blood pressure 141/66, pulse 57, temperature 97.8 F (36.6 C), temperature source Oral, resp. rate 22, height 5\' 1"  (1.549 m), weight 95.9 kg (211 lb 6.7 oz), SpO2 100 %.  Constitutional: NAD Neck: supple, no thyromegaly Respiratory: Rhonchi throughout Cardiovascular: RRR, no murmur, no gallop Abdomen: soft, good BS, nontender Extremities: no edema Neuro: alert and oriented, no focal motor or sensory  deficits  Disposition:  To Radiation protection practitioner    Medication List    STOP taking these medications        propranolol ER 80 MG 24 hr capsule  Commonly known as:  INDERAL LA      TAKE these medications        acetaminophen 325 MG tablet  Commonly known as:  TYLENOL  Take 2 tablets (650 mg total) by mouth every 6 (six) hours as needed for mild pain (or Fever >/= 101).     albuterol (2.5 MG/3ML) 0.083% nebulizer solution  Commonly known as:  PROVENTIL  Take 3 mLs (2.5 mg total) by nebulization every 4 (four) hours as needed for wheezing or shortness of breath.     allopurinol 100 MG tablet  Commonly known as:  ZYLOPRIM  Take 50 mg by mouth daily.     amiodarone 100 MG tablet  Commonly known as:  PACERONE  Take 1 tablet (100 mg total) by mouth daily.     benzonatate 100 MG capsule  Commonly known as:  TESSALON  Take 1 capsule (100 mg total) by mouth 3 (three) times daily as needed for cough.     calcium acetate 667 MG capsule  Commonly known as:  PHOSLO  Take 2 capsules (1,334 mg total) by mouth 3 (three) times daily with meals.     cefTRIAXone 1 g in dextrose 5 % 50 mL  Inject 1 g into the vein daily.     DAILY VITE PO  Take 1 tablet by mouth daily.     dextromethorphan 30 MG/5ML liquid  Commonly known as:  DELSYM  Take 5 mLs (30 mg total) by mouth 2 (two) times daily.     docusate sodium 100 MG capsule  Commonly known as:  COLACE  Take  1 capsule (100 mg total) by mouth 2 (two) times daily as needed for mild constipation.     fluticasone 50 MCG/ACT nasal spray  Commonly known as:  FLONASE  Place 2 sprays into both nostrils at bedtime.     guaiFENesin 600 MG 12 hr tablet  Commonly known as:  MUCINEX  Take 1 tablet (600 mg total) by mouth 2 (two) times daily.     HYDROcodone-acetaminophen 5-325 MG per tablet  Commonly known as:  NORCO/VICODIN  Take 1 tablet by mouth every 6 (six) hours as needed. For pain.     insulin aspart protamine- aspart (70-30) 100  UNIT/ML injection  Commonly known as:  NOVOLOG MIX 70/30  Inject 15-40 Units into the skin 2 (two) times daily with a meal. Depending on blood sugar.     ipratropium-albuterol 0.5-2.5 (3) MG/3ML Soln  Commonly known as:  DUONEB  Take 3 mLs by nebulization 4 (four) times daily.     meclizine 25 MG tablet  Commonly known as:  ANTIVERT  Take 25 mg by mouth 3 (three) times daily as needed. For dizziness.     metoCLOPramide 5 MG tablet  Commonly known as:  REGLAN  Take 5 mg by mouth 2 (two) times daily as needed. For nausea and vomiting.     mirtazapine 7.5 MG tablet  Commonly known as:  REMERON  Take 7.5 mg by mouth at bedtime.     ondansetron 4 MG/2ML Soln injection  Commonly known as:  ZOFRAN  Inject 2 mLs (4 mg total) into the vein every 6 (six) hours as needed for nausea or vomiting.     pantoprazole 40 MG tablet  Commonly known as:  PROTONIX  Take 40 mg by mouth daily.     promethazine 25 MG tablet  Commonly known as:  PHENERGAN  Take 25 mg by mouth 2 (two) times daily as needed. For nausea/vomiting.     ranitidine 300 MG tablet  Commonly known as:  ZANTAC  Take 300 mg by mouth daily.     RENVELA 800 MG tablet  Generic drug:  sevelamer carbonate  Take 1,600 mg by mouth 3 (three) times daily with meals. And with snacks.     sevelamer carbonate 800 MG tablet  Commonly known as:  RENVELA  Take 2 tablets (1,600 mg total) by mouth 3 (three) times daily with meals.     SENSIPAR 30 MG tablet  Generic drug:  cinacalcet  Take 30 mg by mouth daily.     tiZANidine 4 MG tablet  Commonly known as:  ZANAFLEX  Take 4 mg by mouth 2 (two) times daily.     traMADol 50 MG tablet  Commonly known as:  ULTRAM  Take 50 mg by mouth every 8 (eight) hours as needed. For pain.     Vancomycin 750 MG/150ML Soln  Commonly known as:  VANCOCIN  Inject 150 mLs (750 mg total) into the vein every Monday, Wednesday, and Friday with hemodialysis.     Vitamin D3 1000 UNITS Caps  Take 1,000  Units by mouth daily.           Follow-up Information    Follow up with Dr. Ashby Dawes (Pulmonary). Schedule an appointment as soon as possible for a visit in 3 weeks.   Contact information:   [phone]     Dr Sabra Heck in 2 weeks  Signed: [place], [place] 08/08/2015, 2:16 PM

## 2015-08-08 NOTE — Plan of Care (Signed)
Problem: Phase I Progression Outcomes Goal: Dyspnea controlled at rest Outcome: Progressing 100% on 1LO2 per [place]

## 2015-08-08 NOTE — Progress Notes (Signed)
Patient ID: [name], female   DOB: 1934/05/12, 81 y.o.   MRN: [id number] Patient ID: [name], female   DOB: 09/10/1934, 81 y.o.   MRN: [id number] Patient ID: [name], female   DOB: 1934-01-29, 81 y.o.   MRN: [id number] [name] is a 81 y.o. female   SUBJECTIVE:  Patient admitted with progressive cough, dyspnea, tachycardia and hypotension. Post ICU, still mild cough, off IV pressors, blood pressure stable. Still coughing up brownish greenish sputum - culture negative No fevers, chills.  BP HR stable, Sats 100% on 3 L. Still very weak.  ______________________________________________________________________  ROS: Review of systems is unremarkable for any active cardiac,respiratory, GI, GU, hematologic, neurologic or psychiatric systems, 10 systems reviewed.  Marland Kitchen amiodarone  100 mg Oral Daily  . calcium acetate  1,334 mg Oral TID WC  . cefTRIAXone (ROCEPHIN)  IV  1 g Intravenous Q24H  . clindamycin (CLEOCIN) IV  600 mg Intravenous 3 times per day  . dextromethorphan  30 mg Oral BID   And  . guaiFENesin  600 mg Oral BID  . insulin aspart  0-5 Units Subcutaneous QHS  . insulin aspart  0-9 Units Subcutaneous TID WC  . ipratropium-albuterol  3 mL Nebulization QID  . pantoprazole  40 mg Oral BID  . sevelamer carbonate  1,600 mg Oral TID WC  . sodium chloride  10-40 mL Intracatheter Q12H  . vancomycin  750 mg Intravenous Q M,W,F-HD   acetaminophen **OR** acetaminophen, albuterol, alteplase, benzonatate, docusate sodium, heparin, lidocaine (PF), lidocaine-prilocaine, ondansetron (ZOFRAN) IV, pentafluoroprop-tetrafluoroeth, senna, sodium chloride, tiZANidine   Past Medical History  Diagnosis Date  . Skin cancer     Resected from legs  . Renal insufficiency     Patient is on dialysis and normal days are M,W and F.  . Diabetes mellitus without complication     Patient takes Insulin  . ESRD (end stage renal disease)     Monday, wednesday, Friday DIalysis  . HTN  (hypertension)   . Essential tremor   . OSA (obstructive sleep apnea)   . A-fib     not on anticoagulation  . Bilateral lower extremity edema   . Osteoarthritis     Past Surgical History  Procedure Laterality Date  . Abdominal hysterectomy    . Cholecystectomy    . Femur fracture surgery      left femur  . Tubal ligation    . Ankle fracture surgery      right ankle  . Neuroplasty / transposition median nerve at carpal tunnel bilateral    . Knee arthroplasty      PHYSICAL EXAM:  BP 104/51 mmHg  Pulse 95  Temp(Src) 96.9 F (36.1 C) (Axillary)  Resp 19  Ht 5\' 1"  (1.549 m)  Wt 95.9 kg (211 lb 6.7 oz)  BMI 39.97 kg/m2  SpO2 100%  Wt Readings from Last 3 Encounters:  08/06/15 95.9 kg (211 lb 6.7 oz)           BP Readings from Last 3 Encounters:  08/08/15 104/51    Constitutional: NAD Neck: supple, no thyromegaly Respiratory: Rhonchi throughout Cardiovascular: RRR, no murmur, no gallop Abdomen: soft, good BS, nontender Extremities: no edema Neuro: alert and oriented, no focal motor or sensory deficits  ASSESSMENT/PLAN:  Labs and imaging studies were reviewed  Progressive complicated pneumonia-is been on Levaquin several times, on Rocephin/clindamycin/vancomycin, sputum culture respiratory flora, likely staph, very slow progress, white count better Right effusion-post thoracentesis, cultures negative  Hypotension-resolved, off  of IV pressors A. fib-back to normal sinus rhythm, amiodarone, echo normal ejection fraction Diabetes-sliding scale ESRD-dialysis Monday Wednesday Friday OSA-continue CPAP at bedtime Will need a full 7-10 days of IV antibiotics since failure of multiple outpatient antibiotics with complicated pneumonia.  Skilled nursing Monday

## 2015-08-08 NOTE — Progress Notes (Signed)
HD tx completed.

## 2015-08-08 NOTE — Progress Notes (Signed)
To Dialysis via bed 

## 2015-08-09 LAB — GLUCOSE, CAPILLARY
GLUCOSE-CAPILLARY: 152 mg/dL — AB (ref 65–99)
GLUCOSE-CAPILLARY: 298 mg/dL — AB (ref 65–99)
GLUCOSE-CAPILLARY: 154 mg/dL — AB (ref 65–99)
Glucose-Capillary: 189 mg/dL — ABNORMAL HIGH (ref 65–99)

## 2015-08-09 MED ORDER — DILTIAZEM HCL 25 MG/5ML IV SOLN: 10.0000 mg | Freq: Once | INTRAVENOUS | Status: DC

## 2015-08-09 MED ORDER — AMIODARONE HCL 200 MG PO TABS
400.0000 mg | ORAL_TABLET | Freq: Every day | ORAL | Status: DC
  Administered 2015-08-09 – 2015-08-11 (×3): 400 mg via ORAL
  Filled 2015-08-09 (×3): qty 2

## 2015-08-09 MED ORDER — DILTIAZEM HCL ER COATED BEADS 180 MG PO CP24
180.0000 mg | ORAL_CAPSULE | Freq: Every day | ORAL | Status: DC
  Administered 2015-08-09 – 2015-08-11 (×3): 180 mg via ORAL
  Filled 2015-08-09 (×3): qty 1

## 2015-08-09 MED ORDER — DILTIAZEM HCL 60 MG PO TABS: 30.0000 mg | ORAL_TABLET | Freq: Four times a day (QID) | ORAL | Status: DC

## 2015-08-09 NOTE — Progress Notes (Signed)
Patient developed recurrent A fib with RVR Is on amiodarone Will give dilt IV 10 mg and start oral dilt 30 q 6 for now

## 2015-08-09 NOTE — Clinical Social Work Note (Signed)
Dr. Candiss Norse with nephrology was rounding on patient this morning and is concerned that placing a PICC line on patient is making her go through an unnecessary procedure due to the current line she has being very similar to a PICC. CSW contacted Cindy, the DON, and her speak with Dr. Candiss Norse. After speaking with Dr. Candiss Norse, [facility] is now stating that they will take patient with the current, central line patient has in place.  Shela Leff MSW,LCSW [phone]

## 2015-08-09 NOTE — Clinical Social Work Placement (Signed)
   CLINICAL SOCIAL WORK PLACEMENT  NOTE  Date:  08/09/2015  Patient Details  Name: [name] MRN: [id number] Date of Birth: 06-30-1934  Clinical Social Work is seeking post-discharge placement for this patient at the [place] level of care (*CSW will initial, date and re-position this form in  chart as items are completed):  Yes   Patient/family provided with [facility] Work Department's list of facilities offering this level of care within the geographic area requested by the patient (or if unable, by the patient's family).  Yes   Patient/family informed of their freedom to choose among providers that offer the needed level of care, that participate in Medicare, Medicaid or managed care program needed by the patient, have an available bed and are willing to accept the patient.  Yes   Patient/family informed of [facility]'s ownership interest in [facility] and [facility], as well as of the fact that they are under no obligation to receive care at these facilities.  PASRR submitted to EDS on       PASRR number received on       Existing PASRR number confirmed on 08/06/15     FL2 transmitted to all facilities in geographic area requested by pt/family on 08/06/15     FL2 transmitted to all facilities within larger geographic area on       Patient informed that his/her managed care company has contracts with or will negotiate with certain facilities, including the following:        Yes   Patient/family informed of bed offers received.  Patient chooses bed at  [facility])     Physician recommends and patient chooses bed at  [facility])    Patient to be transferred to  [facility]) on 08/09/15.  Patient to be transferred to facility by  (EMS)     Patient family notified on 08/09/15 of transfer.  Name of family member notified:        PHYSICIAN Please sign FL2     Additional Comment:     _______________________________________________ Shela Leff, LCSW 08/09/2015, 8:17 AM

## 2015-08-09 NOTE — Progress Notes (Signed)
[place] notified that pt DC is cancelled.

## 2015-08-09 NOTE — Clinical Social Work Note (Signed)
Patient to discharge today after PICC line. Arrangements made yesterday by Caryl Pina, CSW for transfer to [facility] today. Patient to transport via EMS.  Shela Leff MSW,LCSW [phone]

## 2015-08-09 NOTE — Clinical Social Work Note (Signed)
Discharge order suspended due to patient going into afib sustaining. Patient's nurse notified [facility]. CSW will follow up tomorrow. Shela Leff MSW,LCSW [phone]

## 2015-08-09 NOTE — Progress Notes (Signed)
PICC line  Was not placed. See Note from vascular services in chart. Hold off on placing PICC line until tomorrow per Dr. Ola Spurr.

## 2015-08-09 NOTE — Progress Notes (Addendum)
Pt having recurrent Afib rvr (150-160s) sustaining/ Dr. Ola Spurr paged to make aware/ discharge order suspended/ requesting cardio to come see pt again before discharging/ spoke with Dr. Nehemiah Massed via phone/ orders received/ will continue to monitor pt.

## 2015-08-09 NOTE — Progress Notes (Signed)
Report called to [place] at [facility].  Pt in NAD, skin warm and dry, RIJ  CL dsg intact, ports cleaned, flushed and locked.  Pt denies any pain or discomfort at this time. Awaiting EMS transport.

## 2015-08-09 NOTE — Progress Notes (Signed)
Subjective:  Patient feels much better today overall Still having coughing spells but they are better   Objective:  Vital signs in last 24 hours:  Temp:  [97.3 F (36.3 C)-98 F (36.7 C)] 97.8 F (36.6 C) (08/27 1119) Pulse Rate:  [57-124] 104 (08/27 1119) Resp:  [16-35] 20 (08/27 1119) BP: (93-162)/(46-105) 93/46 mmHg (08/27 1119) SpO2:  [98 %-100 %] 99 % (08/27 1119) FiO2 (%):  [28 %] 28 % (08/26 1529) Weight:  [97.1 kg (214 lb 1.1 oz)-98.6 kg (217 lb 6 oz)] 97.1 kg (214 lb 1.1 oz) (08/26 1725)  Weight change:  Filed Weights   08/06/15 1710 08/08/15 1330 08/08/15 1725  Weight: 95.9 kg (211 lb 6.7 oz) 98.6 kg (217 lb 6 oz) 97.1 kg (214 lb 1.1 oz)    Intake/Output: I/O last 3 completed shifts: In: 150 [IV Piggyback:150] Out: 1500 [Other:1500]   Intake/Output this shift:     Physical Exam: General: NAD, resting in bed  Head: Normocephalic, atraumatic. Moist oral mucosal membranes  Eyes: Anicteric  Neck: Supple, trachea midline  Lungs:   mostly clear today, scattered rhonchi   Heart: Irregular, A Fib,   Abdomen:  Soft, nontender, obese, BS present  Extremities:  1+ peripheral edema.  Neurologic: Nonfocal, moving all four extremities  Skin: No lesions  Access: LUE AVF    Basic Metabolic Panel:  Recent Labs Lab 08/03/15 0621 08/04/15 1735 08/05/15 0822 08/06/15 0500 08/07/15 0540 08/08/15 0900  NA 134*  --  138 133* 137 132*  K 4.8  --  4.8 4.5 4.3 5.0  CL 95*  --  97* 93* 96* 93*  CO2 27  --  31 30 32 29  GLUCOSE 165*  --  163* 176* 186* 205*  BUN 20  --  18 27* 15 19  CREATININE 5.15*  --  4.09* 4.73* 3.27* 4.25*  CALCIUM 7.5*  --  7.8* 7.7* 7.9* 8.1*  PHOS  --  7.7*  --   --   --   --     Liver Function Tests:  Recent Labs Lab 08/06/15 0500  AST 20  ALT 10*  ALKPHOS 159*  BILITOT 1.0  PROT 6.0*  ALBUMIN 2.2*   No results for input(s): LIPASE, AMYLASE in the last 168 hours. No results for input(s): AMMONIA in the last 168  hours.  CBC:  Recent Labs Lab 08/03/15 0621 08/05/15 0822 08/06/15 0500 08/07/15 0540 08/08/15 0900  WBC 20.8* 15.2* 15.1* 12.9* 13.4*  NEUTROABS  --  12.1* 12.8* 10.1* 10.0*  HGB 11.3* 10.3* 9.3* 8.9* 9.0*  HCT 35.2 31.2* 27.9* 27.1* 27.4*  MCV 106.8* 104.1* 104.8* 103.3* 103.5*  PLT 189 181 164 159 148*    Cardiac Enzymes: No results for input(s): CKTOTAL, CKMB, CKMBINDEX, TROPONINI in the last 168 hours.  BNP: Invalid input(s): POCBNP  CBG:  Recent Labs Lab 08/08/15 1200 08/08/15 1753 08/08/15 2049 08/09/15 0730 08/09/15 1121  GLUCAP 235* 190* 212* 152* 189*    Microbiology: Results for orders placed or performed during the hospital encounter of 08/02/15  Blood culture (routine x 2)     Status: None   Collection Time: 08/02/15 11:59 AM  Result Value Ref Range Status   Specimen Description BLOOD LEFT ASSIST CONTROL  Final   Special Requests   Final    BOTTLES DRAWN AEROBIC AND ANAEROBIC  AER 6CC ANA 1CC   Culture NO GROWTH 5 DAYS  Final   Report Status 08/07/2015 FINAL  Final  Blood culture (routine x  2)     Status: None   Collection Time: 08/02/15 12:00 PM  Result Value Ref Range Status   Specimen Description BLOOD LEFT FOREARM  Final   Special Requests BOTTLES DRAWN AEROBIC AND ANAEROBIC  1CC  Final   Culture NO GROWTH 5 DAYS  Final   Report Status 08/07/2015 FINAL  Final  Culture, expectorated sputum-assessment     Status: None   Collection Time: 08/02/15  8:37 PM  Result Value Ref Range Status   Specimen Description SPUTUM  Final   Special Requests Normal  Final   Sputum evaluation THIS SPECIMEN IS ACCEPTABLE FOR SPUTUM CULTURE  Final   Report Status 08/04/2015 FINAL  Final  MRSA PCR Screening     Status: Abnormal   Collection Time: 08/02/15  8:37 PM  Result Value Ref Range Status   MRSA by PCR POSITIVE (A) NEGATIVE Final    Comment:        The GeneXpert MRSA Assay (FDA approved for NASAL specimens only), is one component of a comprehensive  MRSA colonization surveillance program. It is not intended to diagnose MRSA infection nor to guide or monitor treatment for MRSA infections. CRITICAL RESULT CALLED TO, READ BACK BY AND VERIFIED WITH: MARCEL TURNER AT 2214 08/02/15.PMH   Culture, respiratory (NON-Expectorated)     Status: None   Collection Time: 08/02/15  8:37 PM  Result Value Ref Range Status   Specimen Description SPUTUM  Final   Special Requests Normal Reflexed from [id number]  Final   Gram Stain   Final    FAIR SPECIMEN - 70-80% WBCS FEW WBC SEEN MANY GRAM POSITIVE COCCI    Culture NORMAL UPPER RESPIRATORY FLORA  Final   Report Status 08/05/2015 FINAL  Final  Body fluid culture     Status: None   Collection Time: 08/04/15  2:51 PM  Result Value Ref Range Status   Specimen Description PLEURAL  Final   Special Requests NONE  Final   Gram Stain NO WBC SEEN NO ORGANISMS SEEN   Final   Culture NO GROWTH 4 DAYS  Final   Report Status 08/08/2015 FINAL  Final    Coagulation Studies: No results for input(s): LABPROT, INR in the last 72 hours.  Urinalysis: No results for input(s): COLORURINE, LABSPEC, PHURINE, GLUCOSEU, HGBUR, BILIRUBINUR, KETONESUR, PROTEINUR, UROBILINOGEN, NITRITE, LEUKOCYTESUR in the last 72 hours.  Invalid input(s): APPERANCEUR    Imaging: No results found.   Medications:     . amiodarone  100 mg Oral Daily  . calcium acetate  1,334 mg Oral TID WC  . clindamycin (CLEOCIN) IV  600 mg Intravenous 3 times per day  . dextromethorphan  30 mg Oral BID   And  . guaiFENesin  600 mg Oral BID  . insulin aspart  0-5 Units Subcutaneous QHS  . insulin aspart  0-9 Units Subcutaneous TID WC  . ipratropium-albuterol  3 mL Nebulization QID  . pantoprazole  40 mg Oral BID  . sevelamer carbonate  1,600 mg Oral TID WC  . sodium chloride  10-40 mL Intracatheter Q12H  . vancomycin  750 mg Intravenous Q M,W,F-HD   acetaminophen **OR** acetaminophen, albuterol, alteplase, benzonatate, docusate sodium,  heparin, lidocaine (PF), lidocaine-prilocaine, ondansetron (ZOFRAN) IV, pentafluoroprop-tetrafluoroeth, senna, sodium chloride, tiZANidine  Assessment/ Plan:  81 y.o. female Diabetes mellitus type 2 x 20 years, ESRD ON HD, HTN ,Psoriasis, Sleep apnea, CPAP, Recurrent vertigo, Essential tremor, Bilateral knee replacement, ACD, Secondary hyperparathyroidism, Cholcystecomy, Hysterectomy, Bilateral Tubal ligation, Carpal tunnel release, left radiocephalic AVF 06/14/40,  admission for chest pain at [facility] 9/13, admission to [facility] for Afib 10/14, on chronic anticoagulation, left hip fracture, left ureteral stones, admission for pneumonia 07/2015. left main and right coronary artery atherosclerosis. Liver cirrhosis. Cardiomegaly with biatrial dilatation, mitral valve calcification.    1.  ESRD on HD MWF:  - Patient will resume dialysis at [facility] rd dialysis center - currently being d/c to [facility]  2.  Hypotension:   - likely due to arrythmia, sepsis. Improved at present - continue to monitor  3.  Anemia of CKD:    - epogen with dialysis  4.  SHPTH:  - monitor Phos this hosptialization - start phoslo  5.  Shortness of breath. Patient's CT scan shows consolidation in the lower lobes of the lungs bilaterally especially right. Concerning for recurrent aspiration pneumonia. It also shows left main and right coronary artery atherosclerosis. Liver cirrhosis. Cardiomegaly with biatrial dilatation, mitral valve calcification.  6. Pneumonia - Length of antibiotics will need to be determined by Dr. Ola Spurr IV vancomycin 750 mg to be given at dialysis. We will call and arrange for that once we know the duration of antibiotic and demonstration - IV Rocephin to be given at the nursing home. The nursing home staff has kindly agreed to take the patient with IJ central line, which has the same functionality of an IJ PICC line. The central line can be removed once the antibiotic administration is  complete.     LOS: 7 Beverley Allender 8/27/201611:56 AM

## 2015-08-09 NOTE — Clinical Social Work Note (Signed)
Patient's nurse concerned that [facility] could not take patient with a PICC having to be placed in patient's neck. Patient cannot get the PICC placed in her arms due to dialysis access. CSW had [facility] ask DON, Barbra Sarks, to contact this CSW. Jenny Reichmann called this CSW and initially was in a panic thinking that patient was already at their facility and stating they told the CSW yesterday that they could not take the patient. CSW was able to clear up Cindy's confusion and Jenny Reichmann stated that they will take patient today as long as she has her PICC placed and that she can have her PICC placed in her neck. Cindy asked that I fax the narcotic script to the facility so that they can get it by 12 which is when their pharmacy closes. CSW has faxed this script to [facility]. Shela Leff MSW,LCSW [phone]

## 2015-08-10 LAB — GLUCOSE, CAPILLARY
GLUCOSE-CAPILLARY: 141 mg/dL — AB (ref 65–99)
GLUCOSE-CAPILLARY: 248 mg/dL — AB (ref 65–99)
GLUCOSE-CAPILLARY: 260 mg/dL — AB (ref 65–99)
Glucose-Capillary: 254 mg/dL — ABNORMAL HIGH (ref 65–99)

## 2015-08-10 MED ORDER — ALBUMIN HUMAN 25 % IV SOLN
25.0000 g | Freq: Once | INTRAVENOUS | Status: AC
  Administered 2015-08-11: 25 g via INTRAVENOUS
  Filled 2015-08-10 (×2): qty 100

## 2015-08-10 NOTE — Progress Notes (Signed)
Subjective:  Patient could not be discharged to the nursing home yesterday because of breakthrough atrial fibrillation causing hemodynamic instability. Amiodarone and Cardizem doses were adjusted. She feels well this morning. She is still having coughing spells.   Objective:  Vital signs in last 24 hours:  Temp:  [97.4 F (36.3 C)-98.1 F (36.7 C)] 97.4 F (36.3 C) (08/28 1143) Pulse Rate:  [82-109] 82 (08/28 1143) Resp:  [1-20] 1 (08/28 1143) BP: (95-127)/(46-81) 115/57 mmHg (08/28 1143) SpO2:  [92 %-100 %] 100 % (08/28 1143)  Weight change:  Filed Weights   08/06/15 1710 08/08/15 1330 08/08/15 1725  Weight: 95.9 kg (211 lb 6.7 oz) 98.6 kg (217 lb 6 oz) 97.1 kg (214 lb 1.1 oz)    Intake/Output: I/O last 3 completed shifts: In: 24 [IV Piggyback:50] Out: 0    Intake/Output this shift:     Physical Exam: General: NAD, resting in bed  Head: Normocephalic, atraumatic. Moist oral mucosal membranes  Eyes: Anicteric  Neck: Supple, trachea midline  Lungs:   mostly clear today, scattered rhonchi   Heart: Irregular, A Fib,   Abdomen:  Soft, nontender, obese, BS present  Extremities:  1+ peripheral edema.  Neurologic: Nonfocal, moving all four extremities  Skin: No lesions  Access: LUE AVF    Basic Metabolic Panel:  Recent Labs Lab 08/04/15 1735  08/05/15 0822 08/06/15 0500 08/07/15 0540 08/08/15 0900  NA  --   --  138 133* 137 132*  K  --   --  4.8 4.5 4.3 5.0  CL  --   --  97* 93* 96* 93*  CO2  --   --  31 30 32 29  GLUCOSE  --   --  163* 176* 186* 205*  BUN  --   --  18 27* 15 19  CREATININE  --   --  4.09* 4.73* 3.27* 4.25*  CALCIUM  --   < > 7.8* 7.7* 7.9* 8.1*  PHOS 7.7*  --   --   --   --   --   < > = values in this interval not displayed.  Liver Function Tests:  Recent Labs Lab 08/06/15 0500  AST 20  ALT 10*  ALKPHOS 159*  BILITOT 1.0  PROT 6.0*  ALBUMIN 2.2*   No results for input(s): LIPASE, AMYLASE in the last 168 hours. No results for  input(s): AMMONIA in the last 168 hours.  CBC:  Recent Labs Lab 08/05/15 0822 08/06/15 0500 08/07/15 0540 08/08/15 0900  WBC 15.2* 15.1* 12.9* 13.4*  NEUTROABS 12.1* 12.8* 10.1* 10.0*  HGB 10.3* 9.3* 8.9* 9.0*  HCT 31.2* 27.9* 27.1* 27.4*  MCV 104.1* 104.8* 103.3* 103.5*  PLT 181 164 159 148*    Cardiac Enzymes: No results for input(s): CKTOTAL, CKMB, CKMBINDEX, TROPONINI in the last 168 hours.  BNP: Invalid input(s): POCBNP  CBG:  Recent Labs Lab 08/09/15 1121 08/09/15 1634 08/09/15 2106 08/10/15 0732 08/10/15 1131  GLUCAP 189* 298* 154* 141* 56*    Microbiology: Results for orders placed or performed during the hospital encounter of 08/02/15  Blood culture (routine x 2)     Status: None   Collection Time: 08/02/15 11:59 AM  Result Value Ref Range Status   Specimen Description BLOOD LEFT ASSIST CONTROL  Final   Special Requests   Final    BOTTLES DRAWN AEROBIC AND ANAEROBIC  AER 6CC ANA 1CC   Culture NO GROWTH 5 DAYS  Final   Report Status 08/07/2015 FINAL  Final  Blood culture (routine x 2)     Status: None   Collection Time: 08/02/15 12:00 PM  Result Value Ref Range Status   Specimen Description BLOOD LEFT FOREARM  Final   Special Requests BOTTLES DRAWN AEROBIC AND ANAEROBIC  1CC  Final   Culture NO GROWTH 5 DAYS  Final   Report Status 08/07/2015 FINAL  Final  Culture, expectorated sputum-assessment     Status: None   Collection Time: 08/02/15  8:37 PM  Result Value Ref Range Status   Specimen Description SPUTUM  Final   Special Requests Normal  Final   Sputum evaluation THIS SPECIMEN IS ACCEPTABLE FOR SPUTUM CULTURE  Final   Report Status 08/04/2015 FINAL  Final  MRSA PCR Screening     Status: Abnormal   Collection Time: 08/02/15  8:37 PM  Result Value Ref Range Status   MRSA by PCR POSITIVE (A) NEGATIVE Final    Comment:        The GeneXpert MRSA Assay (FDA approved for NASAL specimens only), is one component of a comprehensive MRSA  colonization surveillance program. It is not intended to diagnose MRSA infection nor to guide or monitor treatment for MRSA infections. CRITICAL RESULT CALLED TO, READ BACK BY AND VERIFIED WITH: MARCEL TURNER AT 2214 08/02/15.PMH   Culture, respiratory (NON-Expectorated)     Status: None   Collection Time: 08/02/15  8:37 PM  Result Value Ref Range Status   Specimen Description SPUTUM  Final   Special Requests Normal Reflexed from [id number]  Final   Gram Stain   Final    FAIR SPECIMEN - 70-80% WBCS FEW WBC SEEN MANY GRAM POSITIVE COCCI    Culture NORMAL UPPER RESPIRATORY FLORA  Final   Report Status 08/05/2015 FINAL  Final  Body fluid culture     Status: None   Collection Time: 08/04/15  2:51 PM  Result Value Ref Range Status   Specimen Description PLEURAL  Final   Special Requests NONE  Final   Gram Stain NO WBC SEEN NO ORGANISMS SEEN   Final   Culture NO GROWTH 4 DAYS  Final   Report Status 08/08/2015 FINAL  Final    Coagulation Studies: No results for input(s): LABPROT, INR in the last 72 hours.  Urinalysis: No results for input(s): COLORURINE, LABSPEC, PHURINE, GLUCOSEU, HGBUR, BILIRUBINUR, KETONESUR, PROTEINUR, UROBILINOGEN, NITRITE, LEUKOCYTESUR in the last 72 hours.  Invalid input(s): APPERANCEUR    Imaging: No results found.   Medications:     . amiodarone  400 mg Oral Daily  . calcium acetate  1,334 mg Oral TID WC  . clindamycin (CLEOCIN) IV  600 mg Intravenous 3 times per day  . dextromethorphan  30 mg Oral BID   And  . guaiFENesin  600 mg Oral BID  . diltiazem  180 mg Oral Daily  . insulin aspart  0-5 Units Subcutaneous QHS  . insulin aspart  0-9 Units Subcutaneous TID WC  . ipratropium-albuterol  3 mL Nebulization QID  . pantoprazole  40 mg Oral BID  . sevelamer carbonate  1,600 mg Oral TID WC  . sodium chloride  10-40 mL Intracatheter Q12H  . vancomycin  750 mg Intravenous Q M,W,F-HD   acetaminophen **OR** acetaminophen, albuterol, alteplase,  benzonatate, docusate sodium, heparin, lidocaine (PF), lidocaine-prilocaine, ondansetron (ZOFRAN) IV, pentafluoroprop-tetrafluoroeth, senna, sodium chloride, tiZANidine  Assessment/ Plan:  81 y.o. female Diabetes mellitus type 2 x 20 years, ESRD ON HD, HTN ,Psoriasis, Sleep apnea, CPAP, Recurrent vertigo, Essential tremor, Bilateral knee replacement, ACD, Secondary hyperparathyroidism,  Cholcystecomy, Hysterectomy, Bilateral Tubal ligation, Carpal tunnel release, left radiocephalic AVF 01/21/78, admission for chest pain at [facility] 9/13, admission to [facility] for Afib 10/14, on chronic anticoagulation, left hip fracture, left ureteral stones, admission for pneumonia 07/2015. left main and right coronary artery atherosclerosis. Liver cirrhosis. Cardiomegaly with biatrial dilatation, mitral valve calcification.    1.  ESRD on HD MWF:  - Patient will resume dialysis at [facility] rd dialysis center - being d/c to [place] for dialysis in the morning  2.  Hypotension:   - likely due to arrythmia, sepsis. Improved at present - continue to monitor  3.  Anemia of CKD:    - epogen with dialysis  4.  SHPTH:  - monitor Phos this hosptialization - start phoslo  5.  Shortness of breath. Patient's CT scan shows consolidation in the lower lobes of the lungs bilaterally especially right. Concerning for recurrent aspiration pneumonia. It also shows left main and right coronary artery atherosclerosis. Liver cirrhosis. Cardiomegaly with biatrial dilatation, mitral valve calcification. Ultrafiltration his dialysis is limited by hypotension   6. Pneumonia - IV vancomycin 750 mg to be given at dialysis. Until September 3. Dialysis unit should be notified of the time of discharge - IV Rocephin to be given at the nursing home. The nursing home staff has kindly agreed to take the patient with IJ central line, which has the same functionality of an IJ PICC line. The central line can be removed once the  antibiotic administration is complete.     LOS: 8 [name] 8/28/201612:01 PM

## 2015-08-10 NOTE — Progress Notes (Signed)
Patient ID: [name], female   DOB: [birth year], 81 y.o.   MRN: [id number] Patient ID: [name], female   DOB: Jun 17, 1934, 81 y.o.   MRN: [id number] Patient ID: [name], female   DOB: 09-21-1934, 81 y.o.   MRN: [id number] [name] is a 81 y.o. female   SUBJECTIVE:  Patient admitted with progressive cough, dyspnea, tachycardia and hypotension. Post ICU, still mild cough, off IV pressors, blood pressure stable. Still coughing up brownish greenish sputum - culture negative No fevers, chills.  Was about to go to SNF yest but developed afib with RVR Given dilt IV and started oral dilt. Cards increased amiodarone,. HR better now ______________________________________________________________________  ROS: Review of systems is unremarkable for any active cardiac,respiratory, GI, GU, hematologic, neurologic or psychiatric systems, 10 systems reviewed.  Marland Kitchen amiodarone  400 mg Oral Daily  . calcium acetate  1,334 mg Oral TID WC  . clindamycin (CLEOCIN) IV  600 mg Intravenous 3 times per day  . dextromethorphan  30 mg Oral BID   And  . guaiFENesin  600 mg Oral BID  . diltiazem  180 mg Oral Daily  . insulin aspart  0-5 Units Subcutaneous QHS  . insulin aspart  0-9 Units Subcutaneous TID WC  . ipratropium-albuterol  3 mL Nebulization QID  . pantoprazole  40 mg Oral BID  . sevelamer carbonate  1,600 mg Oral TID WC  . sodium chloride  10-40 mL Intracatheter Q12H  . vancomycin  750 mg Intravenous Q M,W,F-HD   acetaminophen **OR** acetaminophen, albuterol, alteplase, benzonatate, docusate sodium, heparin, lidocaine (PF), lidocaine-prilocaine, ondansetron (ZOFRAN) IV, pentafluoroprop-tetrafluoroeth, senna, sodium chloride, tiZANidine   Past Medical History  Diagnosis Date  . Skin cancer     Resected from legs  . Renal insufficiency     Patient is on dialysis and normal days are M,W and F.  . Diabetes mellitus without complication     Patient takes Insulin  . ESRD (end stage  renal disease)     Monday, wednesday, Friday DIalysis  . HTN (hypertension)   . Essential tremor   . OSA (obstructive sleep apnea)   . A-fib     not on anticoagulation  . Bilateral lower extremity edema   . Osteoarthritis     Past Surgical History  Procedure Laterality Date  . Abdominal hysterectomy    . Cholecystectomy    . Femur fracture surgery      left femur  . Tubal ligation    . Ankle fracture surgery      right ankle  . Neuroplasty / transposition median nerve at carpal tunnel bilateral    . Knee arthroplasty    . Peripheral vascular catheterization Left 08/06/2015    Procedure: A/V Shuntogram/Fistulagram;  Surgeon: Algernon Huxley, MD;  Location: [facility];  Service: Cardiovascular;  Laterality: Left;    PHYSICAL EXAM:  BP 95/46 mmHg  Pulse 82  Temp(Src) 98.1 F (36.7 C) (Oral)  Resp 19  Ht 5\' 1"  (1.549 m)  Wt 97.1 kg (214 lb 1.1 oz)  BMI 40.47 kg/m2  SpO2 92%  Wt Readings from Last 3 Encounters:  08/08/15 97.1 kg (214 lb 1.1 oz)           BP Readings from Last 3 Encounters:  08/10/15 95/46    Constitutional: NAD Neck: supple, no thyromegaly Respiratory: Rhonchi throughout Cardiovascular: RRR, no murmur, no gallop Abdomen: soft, good BS, nontender Extremities: no edema Neuro: alert and oriented, no focal motor or sensory  deficits  ASSESSMENT/PLAN:  Labs and imaging studies were reviewed Progressive complicated pneumonia - had been on Levaquin several times without improvement, At admit treated with Rocephin/clindamycin/vancomycin and had improvement (she is penicillin allergic) - sputum culture respiratory flora - will plan a total 14 day course from 8/20 - stop date 08/16/15 - may pull neck line after abx on 08/16/15  Right effusion- seen by pulmonary  - status post thoracentesis, cultures negative   Hypotension-resolved, off of IV pressors  A. fib-had RVR yest, cards increased amiodarone, have added oral diltiazed echo normal ejection  fraction. Dced BB  Diabetes-sliding scale  ESRD-dialysis Monday Wednesday Friday  OSA-continue CPAP at bedtime

## 2015-08-10 NOTE — Care Management (Signed)
Informed by CSW that there was concern that patient was to have had another line inserted (PICC) and CSW spoke with DON and received approval that patient could admit with the central line. This morning the patient experiencing exacerbation of a fib and will not be able to discharge to skilled nursing

## 2015-08-11 LAB — RENAL FUNCTION PANEL
ALBUMIN: 2.3 g/dL — AB (ref 3.5–5.0)
ANION GAP: 11 (ref 5–15)
BUN: 24 mg/dL — ABNORMAL HIGH (ref 6–20)
CO2: 28 mmol/L (ref 22–32)
Calcium: 8.6 mg/dL — ABNORMAL LOW (ref 8.9–10.3)
Chloride: 85 mmol/L — ABNORMAL LOW (ref 101–111)
Creatinine, Ser: 4.89 mg/dL — ABNORMAL HIGH (ref 0.44–1.00)
GFR calc Af Amer: 9 mL/min — ABNORMAL LOW (ref >60)
GFR, EST NON AFRICAN AMERICAN: 8 mL/min — AB (ref >60)
Glucose, Bld: 230 mg/dL — ABNORMAL HIGH (ref 65–99)
PHOSPHORUS: 4 mg/dL (ref 2.5–4.6)
POTASSIUM: 5.1 mmol/L (ref 3.5–5.1)
Sodium: 124 mmol/L — ABNORMAL LOW (ref 135–145)

## 2015-08-11 LAB — CBC
HEMATOCRIT: 26.7 % — AB (ref 35.0–47.0)
HEMOGLOBIN: 8.9 g/dL — AB (ref 12.0–16.0)
MCH: 34.4 pg — ABNORMAL HIGH (ref 26.0–34.0)
MCHC: 33.3 g/dL (ref 32.0–36.0)
MCV: 103.2 fL — ABNORMAL HIGH (ref 80.0–100.0)
Platelets: 115 10*3/uL — ABNORMAL LOW (ref 150–440)
RBC: 2.59 MIL/uL — ABNORMAL LOW (ref 3.80–5.20)
RDW: 15.6 % — ABNORMAL HIGH (ref 11.5–14.5)
WBC: 14.2 10*3/uL — AB (ref 3.6–11.0)

## 2015-08-11 LAB — GLUCOSE, CAPILLARY
GLUCOSE-CAPILLARY: 216 mg/dL — AB (ref 65–99)
GLUCOSE-CAPILLARY: 159 mg/dL — AB (ref 65–99)
Glucose-Capillary: 166 mg/dL — ABNORMAL HIGH (ref 65–99)

## 2015-08-11 LAB — VANCOMYCIN, TROUGH: Vancomycin Tr: 14 ug/mL (ref 10–20)

## 2015-08-11 MED ORDER — AMIODARONE HCL 400 MG PO TABS: 400.0000 mg | ORAL_TABLET | Freq: Every day | ORAL | Status: DC

## 2015-08-11 MED ORDER — VANCOMYCIN HCL IN DEXTROSE 1-5 GM/200ML-% IV SOLN: 1000.0000 mg | INTRAVENOUS | Status: DC

## 2015-08-11 MED ORDER — DILTIAZEM HCL ER COATED BEADS 180 MG PO CP24
180.0000 mg | ORAL_CAPSULE | Freq: Every day | ORAL | Status: DC
Start: 2015-08-11 — End: 2015-08-21

## 2015-08-11 MED ORDER — VANCOMYCIN HCL IN DEXTROSE 1-5 GM/200ML-% IV SOLN
1000.0000 mg | INTRAVENOUS | Status: DC
  Administered 2015-08-11: 1000 mg via INTRAVENOUS
  Filled 2015-08-11: qty 200

## 2015-08-11 NOTE — Progress Notes (Signed)
ANTIBIOTIC CONSULT NOTE - FOLLOW UP  Pharmacy Consult for Vancomyicn Indication: pneumonia   Vancomycin 750mg  IV after HD -- HCAP--Day 9  (start date 8/21) Clindamycin 600mg  IV q8hrs--HCAP-- Day 9(start 8/21) Ceftriaxone 1g IV q24 hrs-- HCAP-- Day 8 (start 8/22)   Allergies  Allergen Reactions  . Angiotensin Receptor Blockers     Other reaction(s): Other (See Comments) hyperkalemia  . Penicillins Rash    Patient Measurements: Height: 5\' 1"  (154.9 cm) Weight: 214 lb 1.1 oz (97.1 kg) IBW/kg (Calculated) : 47.8 Adjusted Body Weight: 67.4 kg  Vital Signs: Temp: 97.5 F (36.4 C) (08/29 1230) Temp Source: Oral (08/29 1230) BP: 161/60 mmHg (08/29 1300) Pulse Rate: 66 (08/29 1300)  Labs:  Recent Labs  08/11/15 1232 08/11/15 1234  WBC 14.2*  --   HGB 8.9*  --   PLT 115*  --   CREATININE  --  4.89*   Estimated Creatinine Clearance: 9.9 mL/min (by C-G formula based on Cr of 4.89).  Microbiology: Recent Results (from the past 720 hour(s))  Blood culture (routine x 2)     Status: None   Collection Time: 08/02/15 11:59 AM  Result Value Ref Range Status   Specimen Description BLOOD LEFT ASSIST CONTROL  Final   Special Requests   Final    BOTTLES DRAWN AEROBIC AND ANAEROBIC  AER 6CC ANA [place]   Culture NO GROWTH 5 DAYS  Final   Report Status 08/07/2015 FINAL  Final  Blood culture (routine x 2)     Status: None   Collection Time: 08/02/15 12:00 PM  Result Value Ref Range Status   Specimen Description BLOOD LEFT FOREARM  Final   Special Requests BOTTLES DRAWN AEROBIC AND ANAEROBIC  1CC  Final   Culture NO GROWTH 5 DAYS  Final   Report Status 08/07/2015 FINAL  Final  Culture, expectorated sputum-assessment     Status: None   Collection Time: 08/02/15  8:37 PM  Result Value Ref Range Status   Specimen Description SPUTUM  Final   Special Requests Normal  Final   Sputum evaluation THIS SPECIMEN IS ACCEPTABLE FOR SPUTUM CULTURE  Final   Report Status 08/04/2015 FINAL  Final   MRSA PCR Screening     Status: Abnormal   Collection Time: 08/02/15  8:37 PM  Result Value Ref Range Status   MRSA by PCR POSITIVE (A) NEGATIVE Final    Comment:        The GeneXpert MRSA Assay (FDA approved for NASAL specimens only), is one component of a comprehensive MRSA colonization surveillance program. It is not intended to diagnose MRSA infection nor to guide or monitor treatment for MRSA infections. CRITICAL RESULT CALLED TO, READ BACK BY AND VERIFIED WITH: MARCEL TURNER AT 2214 08/02/15.PMH   Culture, respiratory (NON-Expectorated)     Status: None   Collection Time: 08/02/15  8:37 PM  Result Value Ref Range Status   Specimen Description SPUTUM  Final   Special Requests Normal Reflexed from [id number]  Final   Gram Stain   Final    FAIR SPECIMEN - 70-80% WBCS FEW WBC SEEN MANY GRAM POSITIVE COCCI    Culture NORMAL UPPER RESPIRATORY FLORA  Final   Report Status 08/05/2015 FINAL  Final  Body fluid culture     Status: None   Collection Time: 08/04/15  2:51 PM  Result Value Ref Range Status   Specimen Description PLEURAL  Final   Special Requests NONE  Final   Gram Stain NO WBC SEEN NO ORGANISMS SEEN  Final   Culture NO GROWTH 4 DAYS  Final   Report Status 08/08/2015 FINAL  Final    Anti-infectives    Start     Dose/Rate Route Frequency Ordered Stop   08/11/15 1400  vancomycin (VANCOCIN) IVPB 1000 mg/200 mL premix     1,000 mg 200 mL/hr over 60 Minutes Intravenous Every M-W-F (Hemodialysis) 08/11/15 1346     08/08/15 0000  cefTRIAXone 1 g in dextrose 5 % 50 mL     1 g 100 mL/hr over 30 Minutes Intravenous Every 24 hours 08/08/15 1415     08/08/15 0000  Vancomycin (VANCOCIN) 750 MG/150ML SOLN     750 mg 150 mL/hr over 60 Minutes Intravenous Every M-W-F (Hemodialysis) 08/08/15 1415     08/08/15 0000  clindamycin (CLEOCIN) 300 MG capsule     300 mg Oral 3 times daily 08/08/15 1521     08/06/15 2000  vancomycin (VANCOCIN) IVPB 750 mg/150 ml premix  Status:   Discontinued     750 mg 150 mL/hr over 60 Minutes Intravenous Every M-W-F (Hemodialysis) 08/06/15 0814 08/06/15 0816   08/06/15 1800  vancomycin (VANCOCIN) IVPB 750 mg/150 ml premix  Status:  Discontinued    Comments:  Dialysis RN to give after dialysis   750 mg 150 mL/hr over 60 Minutes Intravenous Every M-W-F (Hemodialysis) 08/06/15 0816 08/11/15 1346   08/06/15 0906  clindamycin (CLEOCIN) 300 MG/50ML IVPB    Comments:  DONAHUE, ROBIN: cabinet override      08/06/15 0906 08/06/15 0910   08/04/15 1200  vancomycin (VANCOCIN) IVPB 750 mg/150 ml premix  Status:  Discontinued     750 mg 150 mL/hr over 60 Minutes Intravenous Every M-W-F (Hemodialysis) 08/03/15 1755 08/06/15 0814   08/04/15 1000  levofloxacin (LEVAQUIN) tablet 500 mg  Status:  Discontinued     500 mg Oral Every 48 hours 08/02/15 1408 08/04/15 0746   08/04/15 0900  cefTRIAXone (ROCEPHIN) 1 g in dextrose 5 % 50 mL IVPB     1 g 100 mL/hr over 30 Minutes Intravenous Every 24 hours 08/04/15 0746 08/09/15 0859   08/03/15 1400  clindamycin (CLEOCIN) IVPB 600 mg     600 mg 100 mL/hr over 30 Minutes Intravenous 3 times per day 08/03/15 1217     08/03/15 1300  vancomycin (VANCOCIN) 1,500 mg in sodium chloride 0.9 % 500 mL IVPB     1,500 mg 250 mL/hr over 120 Minutes Intravenous  Once 08/03/15 1224 08/03/15 1625   08/02/15 1345  cefTRIAXone (ROCEPHIN) 1 g in dextrose 5 % 50 mL IVPB  Status:  Discontinued     1 g 100 mL/hr over 30 Minutes Intravenous Every 24 hours 08/02/15 1344 08/02/15 1410   08/02/15 1345  levofloxacin (LEVAQUIN) tablet 500 mg  Status:  Discontinued     500 mg Oral Daily 08/02/15 1344 08/02/15 1408   08/02/15 1045  levofloxacin (LEVAQUIN) IVPB 750 mg     750 mg 100 mL/hr over 90 Minutes Intravenous  Once 08/02/15 1038 08/02/15 1332      Assessment: 81 y/o F with RLL HCAP and h/o PCN allergy (rash) ordered empiric abx with clindamycin, ceftriaxone, and vancomycin. Sputum growing GPCs, which is covered by  vancomycin.   Goal of Therapy:  Pre-HD vancomycin level: 15-25  Plan:  Random vancomycin trough 8/24= 20 Continue with vancomycin 750mg  IV after HD sessions (last session 8/24). Vanc trough on 8/29 prior to HD = 14  Will order Vancomycin 1 gm IV once to be given with  HD today.  Patient's level just below goal range of 15-25.  Patient to leave with outpatient Vancomycin 750 mg IV qHD.  Course to be completed on 9/3.  Dosing of 750 mg IV after HD is reasonable for the duration of the course.   Puneet Masoner G 08/11/2015,1:51 PM

## 2015-08-11 NOTE — Progress Notes (Signed)
Telephoned receiving facility [facility] report given to Laney Pastor, LPN pt. Will be discharge with right IJ CVL. HD today 2L off pt is stable. Awaiting transport at this time for pick up. Discharge instructions given to pt.

## 2015-08-11 NOTE — Evaluation (Signed)
Occupational Therapy Evaluation Patient Details Name: [name] MRN: [id number] DOB: [birth year] Today's Date: 08/11/2015    History of Present Illness Pt admitted to hospital with SOB/gross weakness/emesis, diagnosed as sepsis secondary to pneumonial bronchitis.  Pt presents with hypotensive symptoms that need to be closely mointored.    Clinical Impression   Patient becomes short of breath during activities of daily living such as lower body dressing.   Follow Up Recommendations  SNF    Equipment Recommendations       Recommendations for Other Services       Precautions / Restrictions Precautions Precautions: Fall Restrictions Weight Bearing Restrictions: No      Mobility Bed Mobility                  Transfers                      Balance                                            ADL                                         General ADL Comments: Patient had been independent with most basic ADL although did get some assist for lower body dressing secondary to shortness of breath. She now needs more assist. Practiced lower body dressing techniques using hip kit to prevent shortness of breath with hand over hand assist.     Vision     Perception     Praxis      Pertinent Vitals/Pain       Hand Dominance     Extremity/Trunk Assessment Upper Extremity Assessment Upper Extremity Assessment:  (B UE 4/5)   Lower Extremity Assessment Lower Extremity Assessment: Defer to PT evaluation       Communication Communication Communication: No difficulties   Cognition Arousal/Alertness: Awake/alert   Overall Cognitive Status: Within Functional Limits for tasks assessed                     General Comments       Exercises       Shoulder Instructions      Home Living Family/patient expects to be discharged to:: Skilled nursing facility Living Arrangements: Spouse/significant  other Available Help at Discharge: Family Type of Home: House Home Access: Stairs to enter Technical brewer of Steps: 2 Entrance Stairs-Rails: None Home Layout: One level     Bathroom Shower/Tub: Occupational psychologist: Handicapped height Bathroom Accessibility: Yes   Home Equipment: Environmental consultant - 2 wheels          Prior Functioning/Environment Level of Independence: Independent with assistive device(s) (with basic ADL)             OT Diagnosis: Generalized weakness;Other (comment) (Shortness)   OT Problem List: Decreased activity tolerance;Impaired balance (sitting and/or standing)   OT Treatment/Interventions: Self-care/ADL training    OT Goals(Current goals can be found in the care plan section)    OT Frequency: Min 1X/week   Barriers to D/C:            Co-evaluation              End of Session Equipment Utilized During Treatment:  (  Hip kit)  Activity Tolerance: Patient tolerated treatment well Patient left: in bed;with call bell/phone within reach;with bed alarm set;with family/visitor present   Time: 2446-9507 OT Time Calculation (min): 19 min Charges:  OT General Charges $OT Visit: 1 Procedure OT Evaluation $Initial OT Evaluation Tier I: 1 Procedure G-Codes:    Myrene Galas, MS/OTR/L   08/11/2015, 1:23 PM

## 2015-08-11 NOTE — Progress Notes (Signed)
Subjective:  Pt seen at bedside. Resting comfortably. Due for HD today. Still having some shortness of breath.   Objective:  Vital signs in last 24 hours:  Temp:  [97.6 F (36.4 C)-98.1 F (36.7 C)] 98.1 F (36.7 C) (08/29 1052) Pulse Rate:  [66-98] 66 (08/29 1052) Resp:  [16-22] 22 (08/29 1052) BP: (106-133)/(49-61) 133/52 mmHg (08/29 1052) SpO2:  [95 %-100 %] 100 % (08/29 1052)  Weight change:  Filed Weights   08/06/15 1710 08/08/15 1330 08/08/15 1725  Weight: 95.9 kg (211 lb 6.7 oz) 98.6 kg (217 lb 6 oz) 97.1 kg (214 lb 1.1 oz)    Intake/Output: I/O last 3 completed shifts: In: 170 [I.V.:20; IV Piggyback:150] Out: 0    Intake/Output this shift:     Physical Exam: General: NAD, resting in bed  Head: Normocephalic, atraumatic. Moist oral mucosal membranes  Eyes: Anicteric  Neck: Supple, trachea midline  Lungs:  Mild bilateral wheezing noted  Heart: Irregular, A Fib  Abdomen:  Soft, nontender, obese, BS present  Extremities:  1+ peripheral edema.  Neurologic: Nonfocal, moving all four extremities  Skin: No lesions  Access: LUE AVF    Basic Metabolic Panel:  Recent Labs Lab 08/04/15 1735  08/05/15 0822 08/06/15 0500 08/07/15 0540 08/08/15 0900  NA  --   --  138 133* 137 132*  K  --   --  4.8 4.5 4.3 5.0  CL  --   --  97* 93* 96* 93*  CO2  --   --  31 30 32 29  GLUCOSE  --   --  163* 176* 186* 205*  BUN  --   --  18 27* 15 19  CREATININE  --   --  4.09* 4.73* 3.27* 4.25*  CALCIUM  --   < > 7.8* 7.7* 7.9* 8.1*  PHOS 7.7*  --   --   --   --   --   < > = values in this interval not displayed.  Liver Function Tests:  Recent Labs Lab 08/06/15 0500  AST 20  ALT 10*  ALKPHOS 159*  BILITOT 1.0  PROT 6.0*  ALBUMIN 2.2*   No results for input(s): LIPASE, AMYLASE in the last 168 hours. No results for input(s): AMMONIA in the last 168 hours.  CBC:  Recent Labs Lab 08/05/15 0822 08/06/15 0500 08/07/15 0540 08/08/15 0900  WBC 15.2* 15.1*  12.9* 13.4*  NEUTROABS 12.1* 12.8* 10.1* 10.0*  HGB 10.3* 9.3* 8.9* 9.0*  HCT 31.2* 27.9* 27.1* 27.4*  MCV 104.1* 104.8* 103.3* 103.5*  PLT 181 164 159 148*    Cardiac Enzymes: No results for input(s): CKTOTAL, CKMB, CKMBINDEX, TROPONINI in the last 168 hours.  BNP: Invalid input(s): POCBNP  CBG:  Recent Labs Lab 08/10/15 1131 08/10/15 1610 08/10/15 2015 08/11/15 0718 08/11/15 1051  GLUCAP 254* 248* 260* 166* 216*    Microbiology: Results for orders placed or performed during the hospital encounter of 08/02/15  Blood culture (routine x 2)     Status: None   Collection Time: 08/02/15 11:59 AM  Result Value Ref Range Status   Specimen Description BLOOD LEFT ASSIST CONTROL  Final   Special Requests   Final    BOTTLES DRAWN AEROBIC AND ANAEROBIC  AER 6CC ANA 1CC   Culture NO GROWTH 5 DAYS  Final   Report Status 08/07/2015 FINAL  Final  Blood culture (routine x 2)     Status: None   Collection Time: 08/02/15 12:00 PM  Result Value Ref  Range Status   Specimen Description BLOOD LEFT FOREARM  Final   Special Requests BOTTLES DRAWN AEROBIC AND ANAEROBIC  1CC  Final   Culture NO GROWTH 5 DAYS  Final   Report Status 08/07/2015 FINAL  Final  Culture, expectorated sputum-assessment     Status: None   Collection Time: 08/02/15  8:37 PM  Result Value Ref Range Status   Specimen Description SPUTUM  Final   Special Requests Normal  Final   Sputum evaluation THIS SPECIMEN IS ACCEPTABLE FOR SPUTUM CULTURE  Final   Report Status 08/04/2015 FINAL  Final  MRSA PCR Screening     Status: Abnormal   Collection Time: 08/02/15  8:37 PM  Result Value Ref Range Status   MRSA by PCR POSITIVE (A) NEGATIVE Final    Comment:        The GeneXpert MRSA Assay (FDA approved for NASAL specimens only), is one component of a comprehensive MRSA colonization surveillance program. It is not intended to diagnose MRSA infection nor to guide or monitor treatment for MRSA infections. CRITICAL RESULT  CALLED TO, READ BACK BY AND VERIFIED WITH: MARCEL TURNER AT 2214 08/02/15.PMH   Culture, respiratory (NON-Expectorated)     Status: None   Collection Time: 08/02/15  8:37 PM  Result Value Ref Range Status   Specimen Description SPUTUM  Final   Special Requests Normal Reflexed from [id number]  Final   Gram Stain   Final    FAIR SPECIMEN - 70-80% WBCS FEW WBC SEEN MANY GRAM POSITIVE COCCI    Culture NORMAL UPPER RESPIRATORY FLORA  Final   Report Status 08/05/2015 FINAL  Final  Body fluid culture     Status: None   Collection Time: 08/04/15  2:51 PM  Result Value Ref Range Status   Specimen Description PLEURAL  Final   Special Requests NONE  Final   Gram Stain NO WBC SEEN NO ORGANISMS SEEN   Final   Culture NO GROWTH 4 DAYS  Final   Report Status 08/08/2015 FINAL  Final    Coagulation Studies: No results for input(s): LABPROT, INR in the last 72 hours.  Urinalysis: No results for input(s): COLORURINE, LABSPEC, PHURINE, GLUCOSEU, HGBUR, BILIRUBINUR, KETONESUR, PROTEINUR, UROBILINOGEN, NITRITE, LEUKOCYTESUR in the last 72 hours.  Invalid input(s): APPERANCEUR    Imaging: No results found.   Medications:     . albumin human  25 g Intravenous Once  . amiodarone  400 mg Oral Daily  . calcium acetate  1,334 mg Oral TID WC  . clindamycin (CLEOCIN) IV  600 mg Intravenous 3 times per day  . dextromethorphan  30 mg Oral BID   And  . guaiFENesin  600 mg Oral BID  . diltiazem  180 mg Oral Daily  . insulin aspart  0-5 Units Subcutaneous QHS  . insulin aspart  0-9 Units Subcutaneous TID WC  . ipratropium-albuterol  3 mL Nebulization QID  . pantoprazole  40 mg Oral BID  . sevelamer carbonate  1,600 mg Oral TID WC  . sodium chloride  10-40 mL Intracatheter Q12H  . vancomycin  750 mg Intravenous Q M,W,F-HD   acetaminophen **OR** acetaminophen, albuterol, alteplase, benzonatate, docusate sodium, heparin, lidocaine (PF), lidocaine-prilocaine, ondansetron (ZOFRAN) IV,  pentafluoroprop-tetrafluoroeth, senna, sodium chloride, tiZANidine  Assessment/ Plan:  81 y.o. female Diabetes mellitus type 2 x 20 years, ESRD ON HD, HTN ,Psoriasis, Sleep apnea, CPAP, Recurrent vertigo, Essential tremor, Bilateral knee replacement, ACD, Secondary hyperparathyroidism, Cholcystecomy, Hysterectomy, Bilateral Tubal ligation, Carpal tunnel release, left radiocephalic AVF 08/18/77, admission  for chest pain at [facility] 9/13, admission to [facility] for Afib 10/14, on chronic anticoagulation, left hip fracture, left ureteral stones, admission for pneumonia 07/2015. left main and right coronary artery atherosclerosis. Liver cirrhosis. Cardiomegaly with biatrial dilatation, mitral valve calcification.    1.  ESRD on HD MWF:  - Patient scheduled to have HD today, orders prepared.   2.  Hypotension:   - BP up to 133/52 today, improved.  3.  Anemia of CKD:    - hgb 8.9, continue epogen with HD.  4.  SHPTH:  - last phos high at 7.7, pt on renvela 1600mg  po tid/wm as well phoslo.  5.  Shortness of breath. Patient's CT scan shows consolidation in the lower lobes of the lungs bilaterally especially right. Concerning for recurrent aspiration pneumonia. It also shows left main and right coronary artery atherosclerosis. Liver cirrhosis. Cardiomegaly with biatrial dilatation, mitral valve calcification. - pt on vancomycin and clindamycin  6. Bacterial Pneumonia - IV vancomycin 750 mg to be given at dialysis. Until September 3. Dialysis unit should be notified of the time of discharge - Pt currently off of ceftriaxone.  On clindamycin at this time.      LOS: 9 Delfin Squillace 8/29/201611:58 AM

## 2015-08-11 NOTE — Clinical Social Work Note (Signed)
CSW spoke to pt's daughter and they are currently deciding between [facility] and [facility].  CSW notified them that pt did need to decide today on where they would like to go.  Pt's daughter stated that she would get back to CSW as soon as possible/

## 2015-08-11 NOTE — Clinical Social Work Note (Signed)
CSW printed updated DC summary and re sent DC summary to [facility].  RN notified and facility notified that pt would DC today via EMS.  CSW signing off unless further needs arise.

## 2015-08-11 NOTE — Care Management Important Message (Signed)
Important Message  Patient Details  Name: [name] MRN: [id number] Date of Birth: Feb 02, 1934   Medicare Important Message Given:  Yes-third notification given    Katrina Stack, RN 08/11/2015, 10:02 AM

## 2015-08-11 NOTE — Discharge Summary (Addendum)
Alexzandria Massman, is a 81 y.o. female  DOB 1934/11/04  MRN [id number].  Admission date:  08/02/2015  Admitting Physician  Gladstone Lighter, MD  Discharge Date:  08/11/2015    Admission Diagnosis  Community acquired pneumonia [J18.9]  Discharge Diagnoses   Progressive complicated pneumonia, likely staph aureus End-stage renal disease Diabetes mellitus, insulin requiring Hypertension Atrial fibrillation, rate uncontrolled Obstructive sleep apnea Osteoarthritis   Past Medical History  Diagnosis Date  . Skin cancer     Resected from legs  . Renal insufficiency     Patient is on dialysis and normal days are M,W and F.  . Diabetes mellitus without complication     Patient takes Insulin  . ESRD (end stage renal disease)     Monday, wednesday, Friday DIalysis  . HTN (hypertension)   . Essential tremor   . OSA (obstructive sleep apnea)   . A-fib     not on anticoagulation  . Bilateral lower extremity edema   . Osteoarthritis     Past Surgical History  Procedure Laterality Date  . Abdominal hysterectomy    . Cholecystectomy    . Femur fracture surgery      left femur  . Tubal ligation    . Ankle fracture surgery      right ankle  . Neuroplasty / transposition median nerve at carpal tunnel bilateral    . Knee arthroplasty    . Peripheral vascular catheterization Left 08/06/2015    Procedure: A/V Shuntogram/Fistulagram;  Surgeon: Algernon Huxley, MD;  Location: [facility];  Service: Cardiovascular;  Laterality: Left;       History of present illness and  Hospital Course:     Kindly see H&P for history of present illness and admission details, please review complete Labs, Consult reports and Test reports for all details in brief  HPI  from the history and physical done on the day of admission    Hospital Course    Patient was admitted with hypotension and, stated pneumonia. She  was on IV pressors for several days. She underwent a right-sided thoracentesis which showed no malignancy and no infection. She had been on many weeks of oral antibiotics, with failure as an outpatient and was treated with IV Rocephin, IV clindamycin, IV vancomycin. Sputum culture grew normal flora, blood cultures normal. Dr. Adrian Prows of infectious disease recommended IV vancomycin and Rocephin through September 3 with oral clindamycin through that time. She has a central line, IJ. Overall her chest x-ray and cough has improved. Her course was complicated by rapid A. fib, now back in normal sinus rhythm with the addition of diltiazem and amiodarone.   Discharge Condition: Stable   Follow UP  Dr. Sabra Heck 2 weeks    Discharge Instructions  and  Discharge Medications  Tylenol 650 mg every 6 when necessary Amiodarone 100 mg daily Tessalon 100 mg 3 times a day Calcium acetate 667 mg 2 tabs 3 times a day Rocephin 1 g IV daily through September 3 Clindamycin 300 mg  po 3 times a day through September 3 Delsym 5 cc twice a day Diltiazem ER 180 mg daily Colace 100 mg twice a day Mucinex 600 mg twice a day DuoNeb nebulizer 3 cc 4 times a day Vancomycin 1000 mg IV every Monday Wednesday Friday through September 3 Allopurinol 50 mg daily NovoLog 7030 mix insulin 25 units twice a day, ac Flonase 2 sprays daily Norco 5/325 every 6 when necessary pain Meclizine 25 mg 3 times a day when necessary Mirtazapine 7.5 g at bedtime Protonix 40 mg daily Phenergan 25 g twice a day when necessary nausea Sensipar 30 mg daily Tizanidine 4 mg twice a day Vitamin D3 1000 units daily Zantac 300 mg at bedtime  Today   Subjective:   Lashuna Tamashiro today is stable and ready to go to skilled nursing.   Objective:   Blood pressure 116/49, pulse 75, temperature 97.6 F (36.4 C), temperature source Oral, resp. rate 16, height 5\' 1"  (1.549 m), weight 97.1 kg (214 lb 1.1 oz), SpO2 95 %.   Exam Awake  Alert, Oriented x 3, No new F.N deficits, Normal affect [place].AT,PERRAL Supple Neck,No JVD, No cervical lymphadenopathy appriciated.  Symmetrical Chest wall movement, Good air movement bilaterally, CTAB RRR,No Gallops,Rubs or new Murmurs, Abd Soft, Non tender, No rebound. No Cyanosis, Clubbing or edema, No new Rash or bruise  Total Time in preparing paper work, data evaluation and todays exam - 35 minutes  MILLER,MARK F. M.D on 08/11/2015 at 8:02 AM

## 2015-08-13 NOTE — Care Management Note (Signed)
Prescription for antibiotics to be given at dialysis have been forwarded to center via email and [employer] along with discharge summary. [name] Dialysis Liaison [phone]

## 2015-08-14 ENCOUNTER — Inpatient Hospital Stay
Admission: EM | Admit: 2015-08-14 | Discharge: 2015-08-21 | DRG: 871 | Disposition: A | Discharge status: Skilled Nursing Facility | Payer: Medicare Other | Attending: Internal Medicine | Admitting: Internal Medicine

## 2015-08-14 ENCOUNTER — Encounter: Payer: Self-pay | Admitting: Internal Medicine

## 2015-08-14 ENCOUNTER — Inpatient Hospital Stay: Payer: Medicare Other

## 2015-08-14 ENCOUNTER — Emergency Department: Payer: Medicare Other

## 2015-08-14 DIAGNOSIS — Z7982 Long term (current) use of aspirin: Secondary | ICD-10-CM | POA: Diagnosis not present

## 2015-08-14 DIAGNOSIS — J9690 Respiratory failure, unspecified, unspecified whether with hypoxia or hypercapnia: Secondary | ICD-10-CM

## 2015-08-14 DIAGNOSIS — E8770 Fluid overload, unspecified: Secondary | ICD-10-CM | POA: Diagnosis present

## 2015-08-14 DIAGNOSIS — J9801 Acute bronchospasm: Secondary | ICD-10-CM | POA: Diagnosis not present

## 2015-08-14 DIAGNOSIS — D631 Anemia in chronic kidney disease: Secondary | ICD-10-CM | POA: Diagnosis present

## 2015-08-14 DIAGNOSIS — J9621 Acute and chronic respiratory failure with hypoxia: Secondary | ICD-10-CM | POA: Diagnosis present

## 2015-08-14 DIAGNOSIS — J948 Other specified pleural conditions: Secondary | ICD-10-CM

## 2015-08-14 DIAGNOSIS — Z79899 Other long term (current) drug therapy: Secondary | ICD-10-CM

## 2015-08-14 DIAGNOSIS — A419 Sepsis, unspecified organism: Secondary | ICD-10-CM | POA: Diagnosis present

## 2015-08-14 DIAGNOSIS — L409 Psoriasis, unspecified: Secondary | ICD-10-CM | POA: Diagnosis present

## 2015-08-14 DIAGNOSIS — Y95 Nosocomial condition: Secondary | ICD-10-CM | POA: Diagnosis present

## 2015-08-14 DIAGNOSIS — E119 Type 2 diabetes mellitus without complications: Secondary | ICD-10-CM

## 2015-08-14 DIAGNOSIS — I4891 Unspecified atrial fibrillation: Secondary | ICD-10-CM | POA: Diagnosis present

## 2015-08-14 DIAGNOSIS — E1143 Type 2 diabetes mellitus with diabetic autonomic (poly)neuropathy: Secondary | ICD-10-CM | POA: Diagnosis present

## 2015-08-14 DIAGNOSIS — E162 Hypoglycemia, unspecified: Secondary | ICD-10-CM | POA: Diagnosis present

## 2015-08-14 DIAGNOSIS — K224 Dyskinesia of esophagus: Secondary | ICD-10-CM | POA: Diagnosis present

## 2015-08-14 DIAGNOSIS — Z880 Allergy status to penicillin: Secondary | ICD-10-CM

## 2015-08-14 DIAGNOSIS — N186 End stage renal disease: Secondary | ICD-10-CM | POA: Diagnosis present

## 2015-08-14 DIAGNOSIS — E1122 Type 2 diabetes mellitus with diabetic chronic kidney disease: Secondary | ICD-10-CM | POA: Diagnosis present

## 2015-08-14 DIAGNOSIS — I120 Hypertensive chronic kidney disease with stage 5 chronic kidney disease or end stage renal disease: Secondary | ICD-10-CM | POA: Diagnosis present

## 2015-08-14 DIAGNOSIS — Z888 Allergy status to other drugs, medicaments and biological substances status: Secondary | ICD-10-CM | POA: Diagnosis not present

## 2015-08-14 DIAGNOSIS — J189 Pneumonia, unspecified organism: Secondary | ICD-10-CM | POA: Diagnosis not present

## 2015-08-14 DIAGNOSIS — Z992 Dependence on renal dialysis: Secondary | ICD-10-CM | POA: Diagnosis not present

## 2015-08-14 DIAGNOSIS — Z7901 Long term (current) use of anticoagulants: Secondary | ICD-10-CM

## 2015-08-14 DIAGNOSIS — R627 Adult failure to thrive: Secondary | ICD-10-CM | POA: Diagnosis present

## 2015-08-14 DIAGNOSIS — E11649 Type 2 diabetes mellitus with hypoglycemia without coma: Secondary | ICD-10-CM | POA: Diagnosis present

## 2015-08-14 DIAGNOSIS — Z96653 Presence of artificial knee joint, bilateral: Secondary | ICD-10-CM | POA: Diagnosis present

## 2015-08-14 DIAGNOSIS — Z794 Long term (current) use of insulin: Secondary | ICD-10-CM | POA: Diagnosis not present

## 2015-08-14 DIAGNOSIS — Z6841 Body Mass Index (BMI) 40.0 and over, adult: Secondary | ICD-10-CM

## 2015-08-14 DIAGNOSIS — J690 Pneumonitis due to inhalation of food and vomit: Secondary | ICD-10-CM | POA: Diagnosis present

## 2015-08-14 DIAGNOSIS — Z85828 Personal history of other malignant neoplasm of skin: Secondary | ICD-10-CM

## 2015-08-14 DIAGNOSIS — G4733 Obstructive sleep apnea (adult) (pediatric): Secondary | ICD-10-CM | POA: Diagnosis present

## 2015-08-14 DIAGNOSIS — J159 Unspecified bacterial pneumonia: Secondary | ICD-10-CM | POA: Diagnosis present

## 2015-08-14 DIAGNOSIS — J9601 Acute respiratory failure with hypoxia: Secondary | ICD-10-CM

## 2015-08-14 DIAGNOSIS — M1990 Unspecified osteoarthritis, unspecified site: Secondary | ICD-10-CM | POA: Diagnosis present

## 2015-08-14 DIAGNOSIS — E876 Hypokalemia: Secondary | ICD-10-CM | POA: Diagnosis not present

## 2015-08-14 DIAGNOSIS — I959 Hypotension, unspecified: Secondary | ICD-10-CM | POA: Diagnosis not present

## 2015-08-14 DIAGNOSIS — J90 Pleural effusion, not elsewhere classified: Secondary | ICD-10-CM | POA: Diagnosis present

## 2015-08-14 DIAGNOSIS — I482 Chronic atrial fibrillation: Secondary | ICD-10-CM | POA: Diagnosis present

## 2015-08-14 DIAGNOSIS — N2581 Secondary hyperparathyroidism of renal origin: Secondary | ICD-10-CM | POA: Diagnosis present

## 2015-08-14 DIAGNOSIS — Z9889 Other specified postprocedural states: Secondary | ICD-10-CM

## 2015-08-14 DIAGNOSIS — J9600 Acute respiratory failure, unspecified whether with hypoxia or hypercapnia: Secondary | ICD-10-CM | POA: Diagnosis present

## 2015-08-14 HISTORY — DX: Heart failure, unspecified: I50.9

## 2015-08-14 HISTORY — DX: Cardiac arrhythmia, unspecified: I49.9

## 2015-08-14 HISTORY — DX: Anemia, unspecified: D64.9

## 2015-08-14 LAB — BASIC METABOLIC PANEL
ANION GAP: 9 (ref 5–15)
BUN: 7 mg/dL (ref 6–20)
CHLORIDE: 95 mmol/L — AB (ref 101–111)
CO2: 33 mmol/L — ABNORMAL HIGH (ref 22–32)
Calcium: 8.1 mg/dL — ABNORMAL LOW (ref 8.9–10.3)
Creatinine, Ser: 2.5 mg/dL — ABNORMAL HIGH (ref 0.44–1.00)
GFR calc Af Amer: 20 mL/min — ABNORMAL LOW (ref >60)
GFR, EST NON AFRICAN AMERICAN: 17 mL/min — AB (ref >60)
Glucose, Bld: 114 mg/dL — ABNORMAL HIGH (ref 65–99)
POTASSIUM: 3.1 mmol/L — AB (ref 3.5–5.1)
SODIUM: 137 mmol/L (ref 135–145)

## 2015-08-14 LAB — BODY FLUID CELL COUNT WITH DIFFERENTIAL
Eos, Fluid: 0 %
LYMPHS FL: 49 %
Monocyte-Macrophage-Serous Fluid: 11 %
Neutrophil Count, Fluid: 40 %
OTHER CELLS FL: 0 %
Total Nucleated Cell Count, Fluid: 980 cu mm

## 2015-08-14 LAB — PROTEIN, BODY FLUID

## 2015-08-14 LAB — GLUCOSE, CAPILLARY
GLUCOSE-CAPILLARY: 275 mg/dL — AB (ref 65–99)
GLUCOSE-CAPILLARY: 159 mg/dL — AB (ref 65–99)
GLUCOSE-CAPILLARY: 98 mg/dL (ref 65–99)
Glucose-Capillary: 92 mg/dL (ref 65–99)

## 2015-08-14 LAB — CBC
HCT: 27.1 % — ABNORMAL LOW (ref 35.0–47.0)
HEMOGLOBIN: 8.9 g/dL — AB (ref 12.0–16.0)
MCH: 33.9 pg (ref 26.0–34.0)
MCHC: 32.7 g/dL (ref 32.0–36.0)
MCV: 103.5 fL — AB (ref 80.0–100.0)
Platelets: 121 10*3/uL — ABNORMAL LOW (ref 150–440)
RBC: 2.62 MIL/uL — AB (ref 3.80–5.20)
RDW: 15.5 % — ABNORMAL HIGH (ref 11.5–14.5)
WBC: 11.6 10*3/uL — AB (ref 3.6–11.0)

## 2015-08-14 LAB — LACTIC ACID, PLASMA: LACTIC ACID, VENOUS: 1.8 mmol/L (ref 0.5–2.0)

## 2015-08-14 LAB — MRSA PCR SCREENING: MRSA BY PCR: POSITIVE — AB

## 2015-08-14 LAB — LACTATE DEHYDROGENASE, PLEURAL OR PERITONEAL FLUID: LD, Fluid: 83 U/L — ABNORMAL HIGH (ref 3–23)

## 2015-08-14 LAB — TROPONIN I: TROPONIN I: 0.03 ng/mL (ref <0.031)

## 2015-08-14 LAB — PROCALCITONIN: PROCALCITONIN: 0.52 ng/mL

## 2015-08-14 MED ORDER — DEXTROSE 5 % IV SOLN
1.0000 g | INTRAVENOUS | Status: DC
  Administered 2015-08-14 – 2015-08-20 (×6): 1 g via INTRAVENOUS
  Filled 2015-08-14 (×10): qty 1

## 2015-08-14 MED ORDER — NOREPINEPHRINE BITARTRATE 1 MG/ML IV SOLN
0.0000 ug/min | INTRAVENOUS | Status: DC
  Administered 2015-08-14: 20 ug/min via INTRAVENOUS
  Filled 2015-08-14: qty 16

## 2015-08-14 MED ORDER — AMIODARONE HCL 100 MG PO TABS
100.0000 mg | ORAL_TABLET | Freq: Every day | ORAL | Status: DC
  Administered 2015-08-14 – 2015-08-17 (×4): 100 mg via ORAL
  Filled 2015-08-14 (×5): qty 1

## 2015-08-14 MED ORDER — ONDANSETRON HCL 4 MG PO TABS: 4.0000 mg | ORAL_TABLET | Freq: Four times a day (QID) | ORAL | Status: DC | PRN

## 2015-08-14 MED ORDER — ALBUMIN HUMAN 5 % IV SOLN
25.0000 g | INTRAVENOUS | Status: AC
  Administered 2015-08-14: 25 g via INTRAVENOUS
  Filled 2015-08-14: qty 500

## 2015-08-14 MED ORDER — CALCIUM ACETATE (PHOS BINDER) 667 MG PO CAPS
1334.0000 mg | ORAL_CAPSULE | Freq: Three times a day (TID) | ORAL | Status: DC
  Administered 2015-08-14 – 2015-08-21 (×15): 1334 mg via ORAL
  Filled 2015-08-14 (×16): qty 2

## 2015-08-14 MED ORDER — DEXTROSE-NACL 5-0.45 % IV SOLN
INTRAVENOUS | Status: DC
  Administered 2015-08-14: 02:00:00 via INTRAVENOUS

## 2015-08-14 MED ORDER — PANTOPRAZOLE SODIUM 40 MG IV SOLR
40.0000 mg | Freq: Two times a day (BID) | INTRAVENOUS | Status: DC
  Administered 2015-08-14 – 2015-08-15 (×3): 40 mg via INTRAVENOUS
  Filled 2015-08-14 (×3): qty 40

## 2015-08-14 MED ORDER — VANCOMYCIN HCL IN DEXTROSE 1-5 GM/200ML-% IV SOLN
1000.0000 mg | INTRAVENOUS | Status: DC
  Administered 2015-08-15: 1000 mg via INTRAVENOUS
  Filled 2015-08-14 (×2): qty 200

## 2015-08-14 MED ORDER — DEXTROSE 5 % IV SOLN: 0.0000 ug/min | INTRAVENOUS | Status: DC

## 2015-08-14 MED ORDER — DOPAMINE-DEXTROSE 3.2-5 MG/ML-% IV SOLN
INTRAVENOUS | Status: AC
  Administered 2015-08-14: 4.979 ug/kg/min via INTRAVENOUS
  Filled 2015-08-14: qty 250

## 2015-08-14 MED ORDER — CLINDAMYCIN PHOSPHATE 600 MG/50ML IV SOLN
600.0000 mg | Freq: Four times a day (QID) | INTRAVENOUS | Status: DC
  Administered 2015-08-14: 600 mg via INTRAVENOUS
  Filled 2015-08-14 (×5): qty 50

## 2015-08-14 MED ORDER — SODIUM CHLORIDE 0.9 % IJ SOLN
3.0000 mL | Freq: Two times a day (BID) | INTRAMUSCULAR | Status: DC
  Administered 2015-08-14 – 2015-08-20 (×9): 3 mL via INTRAVENOUS

## 2015-08-14 MED ORDER — IPRATROPIUM-ALBUTEROL 0.5-2.5 (3) MG/3ML IN SOLN
3.0000 mL | Freq: Four times a day (QID) | RESPIRATORY_TRACT | Status: DC
  Administered 2015-08-14 – 2015-08-15 (×4): 3 mL via RESPIRATORY_TRACT
  Filled 2015-08-14 (×4): qty 3

## 2015-08-14 MED ORDER — ADULT MULTIVITAMIN W/MINERALS CH
1.0000 | ORAL_TABLET | Freq: Every day | ORAL | Status: DC
  Administered 2015-08-14 – 2015-08-21 (×8): 1 via ORAL
  Filled 2015-08-14 (×8): qty 1

## 2015-08-14 MED ORDER — TIZANIDINE HCL 4 MG PO TABS
4.0000 mg | ORAL_TABLET | Freq: Two times a day (BID) | ORAL | Status: DC
  Administered 2015-08-14 – 2015-08-21 (×15): 4 mg via ORAL
  Filled 2015-08-14 (×15): qty 1

## 2015-08-14 MED ORDER — ACETAMINOPHEN 325 MG PO TABS: 650.0000 mg | ORAL_TABLET | Freq: Four times a day (QID) | ORAL | Status: DC | PRN

## 2015-08-14 MED ORDER — DOPAMINE-DEXTROSE 3.2-5 MG/ML-% IV SOLN
0.0000 ug/kg/min | INTRAVENOUS | Status: DC
  Administered 2015-08-14: 4.979 ug/kg/min via INTRAVENOUS

## 2015-08-14 MED ORDER — HEPARIN SODIUM (PORCINE) 5000 UNIT/ML IJ SOLN
5000.0000 [IU] | Freq: Three times a day (TID) | INTRAMUSCULAR | Status: DC
  Administered 2015-08-14 – 2015-08-21 (×20): 5000 [IU] via SUBCUTANEOUS
  Filled 2015-08-14 (×20): qty 1

## 2015-08-14 MED ORDER — ALLOPURINOL 100 MG PO TABS
50.0000 mg | ORAL_TABLET | Freq: Every day | ORAL | Status: DC
  Administered 2015-08-14 – 2015-08-15 (×2): 50 mg via ORAL
  Administered 2015-08-16: 11:00:00 via ORAL
  Administered 2015-08-17 – 2015-08-21 (×5): 50 mg via ORAL
  Filled 2015-08-14 (×9): qty 1

## 2015-08-14 MED ORDER — SODIUM CHLORIDE 0.9 % IV SOLN
Freq: Once | INTRAVENOUS | Status: AC
  Administered 2015-08-14: 03:00:00 via INTRAVENOUS

## 2015-08-14 MED ORDER — VITAMIN D 1000 UNITS PO TABS
1000.0000 [IU] | ORAL_TABLET | Freq: Every day | ORAL | Status: DC
  Administered 2015-08-14 – 2015-08-21 (×8): 1000 [IU] via ORAL
  Filled 2015-08-14 (×8): qty 1

## 2015-08-14 MED ORDER — CHLORHEXIDINE GLUCONATE CLOTH 2 % EX PADS
6.0000 | MEDICATED_PAD | Freq: Every day | CUTANEOUS | Status: AC
  Administered 2015-08-14 – 2015-08-18 (×3): 6 via TOPICAL

## 2015-08-14 MED ORDER — LEVOFLOXACIN IN D5W 500 MG/100ML IV SOLN: 500.0000 mg | INTRAVENOUS | Status: DC

## 2015-08-14 MED ORDER — IPRATROPIUM BROMIDE 0.02 % IN SOLN: 0.5000 mg | Freq: Four times a day (QID) | RESPIRATORY_TRACT | Status: DC

## 2015-08-14 MED ORDER — DOCUSATE SODIUM 100 MG PO CAPS: 100.0000 mg | ORAL_CAPSULE | Freq: Two times a day (BID) | ORAL | Status: DC | PRN

## 2015-08-14 MED ORDER — ONDANSETRON HCL 4 MG/2ML IJ SOLN: 4.0000 mg | Freq: Four times a day (QID) | INTRAMUSCULAR | Status: DC | PRN

## 2015-08-14 MED ORDER — METRONIDAZOLE IN NACL 5-0.79 MG/ML-% IV SOLN
500.0000 mg | Freq: Three times a day (TID) | INTRAVENOUS | Status: DC
Start: 2015-08-14 — End: 2015-08-14
  Filled 2015-08-14 (×4): qty 100

## 2015-08-14 MED ORDER — FAMOTIDINE 20 MG PO TABS: 20.0000 mg | ORAL_TABLET | Freq: Every day | ORAL | Status: DC

## 2015-08-14 MED ORDER — CLINDAMYCIN PHOSPHATE 600 MG/50ML IV SOLN
600.0000 mg | Freq: Once | INTRAVENOUS | Status: AC
  Administered 2015-08-14: 600 mg via INTRAVENOUS
  Filled 2015-08-14: qty 50

## 2015-08-14 MED ORDER — DEXTROSE 5 % IV SOLN
1.0000 g | INTRAVENOUS | Status: DC
  Administered 2015-08-14: 03:00:00 via INTRAVENOUS
  Filled 2015-08-14: qty 10

## 2015-08-14 MED ORDER — FLUTICASONE PROPIONATE 50 MCG/ACT NA SUSP
2.0000 | Freq: Every day | NASAL | Status: DC
  Administered 2015-08-14 – 2015-08-20 (×7): 2 via NASAL
  Filled 2015-08-14: qty 16

## 2015-08-14 MED ORDER — ACETAMINOPHEN 650 MG RE SUPP: 650.0000 mg | Freq: Four times a day (QID) | RECTAL | Status: DC | PRN

## 2015-08-14 MED ORDER — INSULIN ASPART 100 UNIT/ML ~~LOC~~ SOLN
0.0000 [IU] | Freq: Three times a day (TID) | SUBCUTANEOUS | Status: DC
  Administered 2015-08-14: 5 [IU] via SUBCUTANEOUS
  Administered 2015-08-14: 2 [IU] via SUBCUTANEOUS
  Administered 2015-08-15 (×2): 1 [IU] via SUBCUTANEOUS
  Administered 2015-08-16: 3 [IU] via SUBCUTANEOUS
  Administered 2015-08-16: 2 [IU] via SUBCUTANEOUS
  Administered 2015-08-16: 1 [IU] via SUBCUTANEOUS
  Administered 2015-08-17: 2 [IU] via SUBCUTANEOUS
  Administered 2015-08-17 – 2015-08-18 (×2): 1 [IU] via SUBCUTANEOUS
  Administered 2015-08-18: 2 [IU] via SUBCUTANEOUS
  Administered 2015-08-18: 1 [IU] via SUBCUTANEOUS
  Administered 2015-08-19: 2 [IU] via SUBCUTANEOUS
  Administered 2015-08-19 – 2015-08-20 (×3): 1 [IU] via SUBCUTANEOUS
  Administered 2015-08-20: 3 [IU] via SUBCUTANEOUS
  Administered 2015-08-21: 2 [IU] via SUBCUTANEOUS
  Filled 2015-08-14: qty 3
  Filled 2015-08-14: qty 1
  Filled 2015-08-14: qty 5
  Filled 2015-08-14: qty 2
  Filled 2015-08-14 (×2): qty 1
  Filled 2015-08-14: qty 2
  Filled 2015-08-14: qty 1
  Filled 2015-08-14: qty 2
  Filled 2015-08-14 (×2): qty 1
  Filled 2015-08-14: qty 3
  Filled 2015-08-14 (×2): qty 1
  Filled 2015-08-14: qty 2
  Filled 2015-08-14: qty 1
  Filled 2015-08-14: qty 2
  Filled 2015-08-14: qty 1
  Filled 2015-08-14: qty 2
  Filled 2015-08-14: qty 5

## 2015-08-14 MED ORDER — NOREPINEPHRINE 4 MG/250ML-% IV SOLN
INTRAVENOUS | Status: AC
  Administered 2015-08-14: 4 ug/kg/min
  Filled 2015-08-14: qty 250

## 2015-08-14 MED ORDER — VANCOMYCIN HCL IN DEXTROSE 1-5 GM/200ML-% IV SOLN
1000.0000 mg | Freq: Once | INTRAVENOUS | Status: AC
  Administered 2015-08-14: 1000 mg via INTRAVENOUS
  Filled 2015-08-14: qty 200

## 2015-08-14 MED ORDER — SEVELAMER CARBONATE 800 MG PO TABS
1600.0000 mg | ORAL_TABLET | Freq: Three times a day (TID) | ORAL | Status: DC
  Administered 2015-08-14 – 2015-08-17 (×8): 1600 mg via ORAL
  Filled 2015-08-14 (×9): qty 2

## 2015-08-14 MED ORDER — SODIUM CHLORIDE 0.9 % IV SOLN
INTRAVENOUS | Status: AC
  Administered 2015-08-14: 04:00:00 via INTRAVENOUS

## 2015-08-14 MED ORDER — MIRTAZAPINE 15 MG PO TABS
7.5000 mg | ORAL_TABLET | Freq: Every day | ORAL | Status: DC
  Administered 2015-08-14 – 2015-08-19 (×6): 7.5 mg via ORAL
  Filled 2015-08-14 (×7): qty 1

## 2015-08-14 MED ORDER — LEVOFLOXACIN IN D5W 750 MG/150ML IV SOLN
750.0000 mg | Freq: Once | INTRAVENOUS | Status: AC
  Administered 2015-08-14: 750 mg via INTRAVENOUS
  Filled 2015-08-14: qty 150

## 2015-08-14 MED ORDER — ASPIRIN EC 81 MG PO TBEC
81.0000 mg | DELAYED_RELEASE_TABLET | Freq: Every day | ORAL | Status: DC
  Administered 2015-08-14 – 2015-08-21 (×8): 81 mg via ORAL
  Filled 2015-08-14 (×8): qty 1

## 2015-08-14 MED ORDER — MUPIROCIN 2 % EX OINT
1.0000 "application " | TOPICAL_OINTMENT | Freq: Two times a day (BID) | CUTANEOUS | Status: AC
  Administered 2015-08-14 – 2015-08-18 (×10): 1 via NASAL
  Filled 2015-08-14 (×2): qty 22

## 2015-08-14 MED ORDER — BENZONATATE 100 MG PO CAPS
100.0000 mg | ORAL_CAPSULE | Freq: Three times a day (TID) | ORAL | Status: DC | PRN
  Administered 2015-08-16 – 2015-08-20 (×5): 100 mg via ORAL
  Filled 2015-08-14 (×5): qty 1

## 2015-08-14 MED ORDER — CINACALCET HCL 30 MG PO TABS
30.0000 mg | ORAL_TABLET | Freq: Every day | ORAL | Status: DC
  Administered 2015-08-14 – 2015-08-16 (×3): 30 mg via ORAL
  Filled 2015-08-14 (×3): qty 1

## 2015-08-14 MED ORDER — PANTOPRAZOLE SODIUM 40 MG PO TBEC: 40.0000 mg | DELAYED_RELEASE_TABLET | Freq: Every day | ORAL | Status: DC

## 2015-08-14 MED ORDER — CEFTRIAXONE SODIUM 1 G IJ SOLR
1.0000 g | INTRAMUSCULAR | Status: DC
  Filled 2015-08-14: qty 10

## 2015-08-14 NOTE — Care Management Note (Signed)
Case Management Note  Patient Details  Name: [name] MRN: [id number] Date of Birth: 18-Jul-1934  Subjective/Objective:     Dialysis patient, MWF, [place], [place].  [name] made aware.              Action/Plan:   Expected Discharge Date:                  Expected Discharge Plan:  Skilled Nursing Facility  In-House Referral:  Clinical Social Work  Discharge planning Services  CM Consult  Post Acute Care Choice:    Choice offered to:     DME Arranged:    DME Agency:     HH Arranged:    [place] Agency:     Status of Service:  In process, will continue to follow  Medicare Important Message Given:    Date Medicare IM Given:    Medicare IM give by:    Date Additional Medicare IM Given:    Additional Medicare Important Message give by:     If discussed at [place] of Stay Meetings, dates discussed:    Additional Comments:  Jolly Mango, RN 08/14/2015, 9:55 AM

## 2015-08-14 NOTE — Progress Notes (Signed)
Pt A&O, no pain reported. Pt had low BP after a thoracentesis at the bedside. Md ordered Levophed, albumin and a 593ml bolus. Levophed is currently running at 61mcg/hr. Pt afebrile and remains on IV abx. Lungs with rhonchi throughout and pt currently on 4L o2 via [place]. No BM throughout shift and no urine recorded. Plan for HD tomorrow. Pt refusing SCDs to be placed due to edema and pain to lower extremities. CVAD remains in place and no complications, family at bedside with no additional questions at this time. Will continue to monitor.

## 2015-08-14 NOTE — Consult Note (Signed)
PULMONARY/CCM NOTE  Date of admission: 09/01 Date of consult: 09/01 Reason for consultation: recurrent PNA, hypotension  Pt Profile:  Chronically ill 76 F with ESRD and recurrent PNAs just discharged from hospital 8/29 to NH after admitted with PNA. Discharged on IV abx with R IJ CVl in place. Readmitted in early morning 09/01 with increased dyspnea and AMS. CXR revealed large R effusion. Hypotensive. PCCM asked to assist in eval and mgmt  MAJOR EVENTS/TEST RESULTS: 9/01 admitted with above history 9/01 Thoracentesis:   INDWELLING DEVICES:: R IJ CVL 8/23 >>   MICRO DATA: MRSA PCR 9/01 >> POS   ANTIMICROBIALS:  Vanc 9/01 >>  Ceftaz 9/01 >>   HPI:  As above  Past Medical History  Diagnosis Date  . Skin cancer     Resected from legs  . Renal insufficiency     Patient is on dialysis and normal days are M,W and F.  . Diabetes mellitus without complication     Patient takes Insulin  . ESRD (end stage renal disease)     Monday, wednesday, Friday DIalysis  . HTN (hypertension)   . Essential tremor   . OSA (obstructive sleep apnea)   . A-fib     not on anticoagulation  . Bilateral lower extremity edema   . Osteoarthritis   . Dysrhythmia   . CHF (congestive heart failure)   . Anemia     MEDICATIONS: reviewed  Social History   Social History  . Marital Status: Married    Spouse Name: N/A  . Number of Children: N/A  . Years of Education: N/A   Occupational History  . Not on file.   Social History Main Topics  . Smoking status: Never Smoker   . Smokeless tobacco: Never Used  . Alcohol Use: No  . Drug Use: No  . Sexual Activity: Not on file   Other Topics Concern  . Not on file   Social History Narrative    Family History  Problem Relation Age of Onset  . Diabetes type II Father     ROS - unable to obtain due to AMS  Filed Vitals:   08/14/15 0630 08/14/15 1100 08/14/15 1200 08/14/15 1300  BP: 120/57 75/40 88/49  108/67  Pulse: 78 81 65 76  Temp:  92.7 F (33.7 C) 96.4 F (35.8 C) 96.4 F (35.8 C) 97 F (36.1 C)  TempSrc:      Resp: 23 24 22 20   Height:      Weight:      SpO2: 89% 100% 94% 100%    EXAM:  Gen: chronically ill appearing, no respiratory distress evident HEENT: NCAT, WNL Neck: R IJ CVL in place, JVP cannot be assessed Lungs: dull to percussion and absent BS approx 1/2 up on R, minimal L basilar crackles, no wheezes Cardiovascular: IRIR, rate controlled, no M noted Abdomen: markedly obese, NT, +BS Ext: cool, 1-2+ symmetric pitting pretibial edema Neuro: MAEs, no gross deficits  DATA:  BMP Latest Ref Rng 08/14/2015 08/11/2015 08/08/2015  Glucose 65 - 99 mg/dL 114(H) 230(H) 205(H)  BUN 6 - 20 mg/dL 7 24(H) 19  Creatinine 0.44 - 1.00 mg/dL 2.50(H) 4.89(H) 4.25(H)  Sodium 135 - 145 mmol/L 137 124(L) 132(L)  Potassium 3.5 - 5.1 mmol/L 3.1(L) 5.1 5.0  Chloride 101 - 111 mmol/L 95(L) 85(L) 93(L)  CO2 22 - 32 mmol/L 33(H) 28 29  Calcium 8.9 - 10.3 mg/dL 8.1(L) 8.6(L) 8.1(L)    CBC Latest Ref Rng 08/14/2015 08/11/2015 08/08/2015  WBC 3.6 -  11.0 K/uL 11.6(H) 14.2(H) 13.4(H)  Hemoglobin 12.0 - 16.0 g/dL 8.9(L) 8.9(L) 9.0(L)  Hematocrit 35.0 - 47.0 % 27.1(L) 26.7(L) 27.4(L)  Platelets 150 - 440 K/uL 121(L) 115(L) 148(L)    CXR: Low volumes, large R effusion    IMPRESSION:   Acute on chronic hypoxic respiratory failure Recent PNA, NOS Large R pleural effusion - concern for para-pneumonic  Thoracentesis 8/22 had chemistries c/w borderline exudate CAF, rate controlled Hypotension - concern for septic shock ESRD DM 2 - episodic hypoglycemia Anemia without acute blood loss Adult failure to thrive   PLAN:  Thoracentesis performed today F/U pleural fluid studies Supplemental O2 to maintain SpO2 > 90% ID service following No role for bronchoscopy presently Monitor CVP - goal 8-12 mmHg Norepinephrine to maintain MAP > 60 mmHg Monitor BMET intermittently Monitor I/Os Correct electrolytes as indicated Renal  following - might need CRRT if BP does not improve Cont SSI DVT px: SQ heparin Monitor CBC intermittently Transfuse per usual ICU guidelines Consider Palliative Care input   Merton Border, MD PCCM service Mobile [phone] Pager [phone]

## 2015-08-14 NOTE — ED Provider Notes (Signed)
[facility] Emergency Department Provider Note  ____________________________________________  Time seen: Approximately 2:09 AM  I have reviewed the triage vital signs and the nursing notes.   HISTORY  Chief Complaint Numbness  History limited by patient's distress Majority of history obtained by husband  HPI [name] is a 81 y.o. female who presents to the ED via EMS from [facility] for shortness of breath, generalized numbness and hypoglycemia. Patient was discharged from the hospital yesterday after a 10 day stay for community-acquired pneumonia. She was discharged with her central line in place, receiving IV vancomycin and IV Rocephin plus oral clindamycin until September 3. Patient has not been eating or drinking; nursing home staff has continued to administer insulin. Blood sugar 26 on EMS arrival. She was given 1 amp of D50 which brought her sugar up to greater than 200. Spouse states she had dialysis yesterday as well as physical therapy and feels that patient is more fatigued than usual. Spouse also notes increased BLE swelling over the past 2 days.   Past Medical History  Diagnosis Date  . Skin cancer     Resected from legs  . Renal insufficiency     Patient is on dialysis and normal days are M,W and F.  . Diabetes mellitus without complication     Patient takes Insulin  . ESRD (end stage renal disease)     Monday, wednesday, Friday DIalysis  . HTN (hypertension)   . Essential tremor   . OSA (obstructive sleep apnea)   . A-fib     not on anticoagulation  . Bilateral lower extremity edema   . Osteoarthritis     Patient Active Problem List   Diagnosis Date Noted  . Protein-calorie malnutrition, severe 08/06/2015  . Community acquired pneumonia   . Pleural effusion 08/03/2015  . Acute respiratory failure 08/03/2015  . Cough 08/03/2015  . Atrial fibrillation 08/03/2015  . Pneumonia 08/02/2015    Past Surgical History   Procedure Laterality Date  . Abdominal hysterectomy    . Cholecystectomy    . Femur fracture surgery      left femur  . Tubal ligation    . Ankle fracture surgery      right ankle  . Neuroplasty / transposition median nerve at carpal tunnel bilateral    . Knee arthroplasty    . Peripheral vascular catheterization Left 08/06/2015    Procedure: A/V Shuntogram/Fistulagram;  Surgeon: Algernon Huxley, MD;  Location: [facility];  Service: Cardiovascular;  Laterality: Left;    Current Outpatient Rx  Name  Route  Sig  Dispense  Refill  . acetaminophen (TYLENOL) 325 MG tablet   Oral   Take 2 tablets (650 mg total) by mouth every 6 (six) hours as needed for mild pain (or Fever >/= 101).   60 tablet   3   . allopurinol (ZYLOPRIM) 100 MG tablet   Oral   Take 50 mg by mouth daily.         Marland Kitchen amiodarone (PACERONE) 100 MG tablet   Oral   Take 1 tablet (100 mg total) by mouth daily.   30 tablet   1   . benzonatate (TESSALON) 100 MG capsule   Oral   Take 1 capsule (100 mg total) by mouth 3 (three) times daily as needed for cough.   20 capsule   0   . calcium acetate (PHOSLO) 667 MG capsule   Oral   Take 2 capsules (1,334 mg total) by  mouth 3 (three) times daily with meals.   90 capsule   1   . cefTRIAXone 1 g in dextrose 5 % 50 mL   Intravenous   Inject 1 g into the vein daily.   14 mL   0   . Cholecalciferol (VITAMIN D3) 1000 UNITS CAPS   Oral   Take 1,000 Units by mouth daily.         . clindamycin (CLEOCIN) 300 MG capsule   Oral   Take 1 capsule (300 mg total) by mouth 3 (three) times daily.   21 capsule   0   . dextromethorphan (DELSYM) 30 MG/5ML liquid   Oral   Take 5 mLs (30 mg total) by mouth 2 (two) times daily.   89 mL   0   . diltiazem (CARDIZEM CD) 180 MG 24 hr capsule   Oral   Take 1 capsule (180 mg total) by mouth daily.   30 capsule   11   . docusate sodium (COLACE) 100 MG capsule   Oral   Take 1 capsule (100 mg total) by mouth 2 (two)  times daily as needed for mild constipation.   10 capsule   0   . fluticasone (FLONASE) 50 MCG/ACT nasal spray   Each Nare   Place 2 sprays into both nostrils at bedtime.         Marland Kitchen guaiFENesin (MUCINEX) 600 MG 12 hr tablet   Oral   Take 1 tablet (600 mg total) by mouth 2 (two) times daily.   30 tablet   0   . HYDROcodone-acetaminophen (NORCO/VICODIN) 5-325 MG per tablet   Oral   Take 1 tablet by mouth every 6 (six) hours as needed. For pain.   60 tablet   0   . insulin aspart protamine- aspart (NOVOLOG MIX 70/30) (70-30) 100 UNIT/ML injection   Subcutaneous   Inject 15-40 Units into the skin 2 (two) times daily with a meal. Depending on blood sugar.         . ipratropium-albuterol (DUONEB) 0.5-2.5 (3) MG/3ML SOLN   Nebulization   Take 3 mLs by nebulization 4 (four) times daily.   360 mL   0   . meclizine (ANTIVERT) 25 MG tablet   Oral   Take 25 mg by mouth 3 (three) times daily as needed. For dizziness.         . mirtazapine (REMERON) 7.5 MG tablet   Oral   Take 7.5 mg by mouth at bedtime.         . Multiple Vitamin (DAILY VITE PO)   Oral   Take 1 tablet by mouth daily.         . ondansetron (ZOFRAN) 4 MG/2ML SOLN injection   Intravenous   Inject 2 mLs (4 mg total) into the vein every 6 (six) hours as needed for nausea or vomiting.   2 mL   0   . pantoprazole (PROTONIX) 40 MG tablet   Oral   Take 40 mg by mouth daily.         . promethazine (PHENERGAN) 25 MG tablet   Oral   Take 25 mg by mouth 2 (two) times daily as needed. For nausea/vomiting.         . ranitidine (ZANTAC) 300 MG tablet   Oral   Take 300 mg by mouth daily.         Marland Kitchen RENVELA 800 MG tablet   Oral   Take 1,600 mg by mouth 3 (three) times  daily with meals. And with snacks.           Dispense as written.   . SENSIPAR 30 MG tablet   Oral   Take 30 mg by mouth daily.           Dispense as written.   . sevelamer carbonate (RENVELA) 800 MG tablet   Oral   Take 2  tablets (1,600 mg total) by mouth 3 (three) times daily with meals.   180 tablet   1   . tiZANidine (ZANAFLEX) 4 MG tablet   Oral   Take 4 mg by mouth 2 (two) times daily.         . vancomycin (VANCOCIN) 1 GM/200ML SOLN   Intravenous   Inject 200 mLs (1,000 mg total) into the vein every Monday, Wednesday, and Friday with hemodialysis.   4000 mL   0     Allergies Angiotensin receptor blockers and Penicillins  Family History  Problem Relation Age of Onset  . Diabetes type II Father     Social History Social History  Substance Use Topics  . Smoking status: Never Smoker   . Smokeless tobacco: Never Used  . Alcohol Use: No    Review of Systems Constitutional: No fever/chills Eyes: No visual changes. ENT: No sore throat. Cardiovascular: Denies chest pain. Respiratory: Positive for shortness of breath. Gastrointestinal: No abdominal pain.  No nausea, no vomiting.  No diarrhea.  No constipation. Genitourinary: Negative for dysuria. Musculoskeletal: Negative for back pain. Skin: Negative for rash. Neurological: Negative for headaches, focal weakness. Positive for numbness.  10-point ROS otherwise negative.  ____________________________________________   PHYSICAL EXAM:  VITAL SIGNS: ED Triage Vitals  Enc Vitals Group     BP 08/14/15 0107 101/56 mmHg     Pulse Rate 08/14/15 0107 85     Resp 08/14/15 0107 18     Temp 08/14/15 0107 98.2 F (36.8 C)     Temp Source 08/14/15 0107 Oral     SpO2 08/14/15 0107 95 %     Weight 08/14/15 0107 229 lb (103.874 kg)     Height 08/14/15 0107 5\' 1"  (1.549 m)     Head Cir --      Peak Flow --      Pain Score 08/14/15 0113 0     Pain Loc --      Pain Edu? --      Excl. in [place]? --     Constitutional: Somnolence, awakens to voice. Alert and oriented. Ill-appearing and in moderate acute distress. Eyes: Conjunctivae are normal. PERRL. EOMI. Head: Atraumatic. Nose: No congestion/rhinnorhea. Mouth/Throat: Mucous membranes are  moist.  Oropharynx non-erythematous. Neck: No stridor. Right IJ CVL in place. Cardiovascular: Normal rate, regular rhythm. Grossly normal heart sounds. Thready peripheral circulation. Respiratory: Normal respiratory effort.  No retractions. Lungs with rhonchi and rales. Gastrointestinal: Soft and nontender. No distention. No abdominal bruits. No CVA tenderness. Musculoskeletal: 2+ pitting BLE edema.  No joint effusions. Neurologic:  Normal speech and language. No gross focal neurologic deficits are appreciated.  Skin:  Skin is warm, dry and intact. No rash noted. Psychiatric: Mood and affect are normal. Speech and behavior are normal.  ____________________________________________   LABS (all labs ordered are listed, but only abnormal results are displayed)  Labs Reviewed  CBC - Abnormal; Notable for the following:    WBC 11.6 (*)    RBC 2.62 (*)    Hemoglobin 8.9 (*)    HCT 27.1 (*)    MCV 103.5 (*)  RDW 15.5 (*)    Platelets 121 (*)    All other components within normal limits  BASIC METABOLIC PANEL - Abnormal; Notable for the following:    Potassium 3.1 (*)    Chloride 95 (*)    CO2 33 (*)    Glucose, Bld 114 (*)    Creatinine, Ser 2.50 (*)    Calcium 8.1 (*)    GFR calc non Af Amer 17 (*)    GFR calc Af Amer 20 (*)    All other components within normal limits  TROPONIN I  LACTIC ACID, PLASMA  LACTIC ACID, PLASMA   ____________________________________________  EKG  ED ECG REPORT I, Dewan Emond J, the attending physician, personally viewed and interpreted this ECG.   Date: 08/14/2015  EKG Time: 0108  Rate: 86  Rhythm: atrial fibrillation, rate 86  Axis: normal  Intervals:first-degree A-V block   ST&T Change: nonspecific  ____________________________________________  RADIOLOGY  Portable chest x-ray (viewed by me, interpreted per Dr. Radene Knee): Moderate to large right-sided pleural effusion, with right-sided airspace opacification. Given the appearance on prior  CT, this is concerning for worsening aspiration pneumonia. Mild cardiomegaly noted. ____________________________________________   PROCEDURES  Procedure(s) performed: None  Critical Care performed: Yes, see critical care note(s)   CRITICAL CARE Performed by: Paulette Blanch   Total critical care time: 45 minutes  Critical care time was exclusive of separately billable procedures and treating other patients.  Critical care was necessary to treat or prevent imminent or life-threatening deterioration.  Critical care was time spent personally by me on the following activities: development of treatment plan with patient and/or surrogate as well as nursing, discussions with consultants, evaluation of patient's response to treatment, examination of patient, obtaining history from patient or surrogate, ordering and performing treatments and interventions, ordering and review of laboratory studies, ordering and review of radiographic studies, pulse oximetry and re-evaluation of patient's condition.  ____________________________________________   INITIAL IMPRESSION / ASSESSMENT AND PLAN / ED COURSE  Pertinent labs & imaging results that were available during my care of the patient were reviewed by me and considered in my medical decision making (see chart for details).  81 year old female with a history of ESRD on HD M/W/F, DM, with recent hospitalization, discharged yesterday for the following:  Progressive complicated pneumonia, likely staph aureus End-stage renal disease Diabetes mellitus, insulin requiring Hypertension Atrial fibrillation, rate uncontrolled Obstructive sleep apnea Osteoarthritis  ----------------------------------------- 3:00 AM on 08/14/2015 ----------------------------------------- ED course:  Patient presents with worsening shortness of breath and hypoglycemia which is likely secondary to continued insulin use despite lack of appetite. Patient attempted to eat  a sandwich tray but blood sugars continue to trend down. D5 1/2NS initiated. Chest x-ray demonstrates worsening right-sided aspiration pneumonia; will initiate IV antibiotics therapy. Blood pressures are trending down; BP currently 74/39. Will initiate IV fluid resuscitation and consider dopamine drip if blood pressure is unresponsive to IV fluids. Discussed case with hospitalist who will evaluate patient in the emergency department for admission. ____________________________________________   FINAL CLINICAL IMPRESSION(S) / ED DIAGNOSES  Final diagnoses:  Hypoglycemia  Aspiration pneumonia, unspecified aspiration pneumonia type  Hypotension, unspecified hypotension type      Paulette Blanch, MD 08/14/15 [phone]

## 2015-08-14 NOTE — Progress Notes (Signed)
Speech Therapy Note: received order, reviewed pt's chart noting a thoracentesis is ordered for today. Will f/u w/ pt's status this PM if able and perform BSE then.

## 2015-08-14 NOTE — ED Notes (Signed)
Pt admitted to [facility] 13 report called to [facility]

## 2015-08-14 NOTE — Care Management Note (Signed)
Patient is active at [facility] [address] on MWF schedule.  I have sent admission records to clinic and will update with discharge records when that occurs.   Taiyana Kissler Dialysis Liaison [phone] 

## 2015-08-14 NOTE — ED Notes (Signed)
Pt awaiting admission

## 2015-08-14 NOTE — Consult Note (Addendum)
Subjective:  Please see full GI consult on Ms. London. Patient seen and examined chart reviewed.  Patient admitted with aspiration pneumonia. Initial event was at least 3-4 weeks ago and had been discharged to a SNF. She was brought back with symptoms of recurrent pneumonia and nausea and vomiting. Patient denies any history of dysphagia although states that when she eats things "come back up. She has never had an upper scope in the past she has had a colonoscopy she cannot give me details. He has multiple other medical illnesses and did undergo thoracentesis today. She states that she has been doing a lot of coughing since March 2016. His any dysphagia or difficulty in her swallowing or painful swallowing. Denies any heartburn. He does state she's had about a 30 pound weight loss over the past couple of months.  Objective: Vital signs in last 24 hours: Temp:  [92.6 F (33.7 C)-99.1 F (37.3 C)] 98.4 F (36.9 C) (09/01 1800) Pulse Rate:  [65-90] 89 (09/01 1800) Resp:  [18-33] 24 (09/01 1800) BP: (70-146)/(27-83) 122/56 mmHg (09/01 1800) SpO2:  [89 %-100 %] 100 % (09/01 1800) Weight:  [103.874 kg (229 lb)] 103.874 kg (229 lb) (09/01 0107) Blood pressure 122/56, pulse 89, temperature 98.4 F (36.9 C), temperature source Rectal, resp. rate 24, height 5\' 1"  (1.549 m), weight 103.874 kg (229 lb), SpO2 100 %.   Intake/Output from previous day: 08/31 0701 - 09/01 0700 In: 50 [IV Piggyback:50] Out: -   Intake/Output this shift:     General appearance:  81 year old female a time short of breath Resp:  Left lower lung fields with crackles and wheezing right relatively clear to auscultation Cardio:  Regular rate and rhythm GI:  Soft nontender nondistended bowel sounds positive Extremities:  2+ to 3+ lower  extremity edema   Lab Results: Results for orders placed or performed during the hospital encounter of 08/14/15 (from the past 24 hour(s))  CBC     Status: Abnormal   Collection Time:  08/14/15  1:14 AM  Result Value Ref Range   WBC 11.6 (H) 3.6 - 11.0 K/uL   RBC 2.62 (L) 3.80 - 5.20 MIL/uL   Hemoglobin 8.9 (L) 12.0 - 16.0 g/dL   HCT 27.1 (L) 35.0 - 47.0 %   MCV 103.5 (H) 80.0 - 100.0 fL   MCH 33.9 26.0 - 34.0 pg   MCHC 32.7 32.0 - 36.0 g/dL   RDW 15.5 (H) 11.5 - 14.5 %   Platelets 121 (L) 150 - 440 K/uL  Basic metabolic panel     Status: Abnormal   Collection Time: 08/14/15  1:14 AM  Result Value Ref Range   Sodium 137 135 - 145 mmol/L   Potassium 3.1 (L) 3.5 - 5.1 mmol/L   Chloride 95 (L) 101 - 111 mmol/L   CO2 33 (H) 22 - 32 mmol/L   Glucose, Bld 114 (H) 65 - 99 mg/dL   BUN 7 6 - 20 mg/dL   Creatinine, Ser 2.50 (H) 0.44 - 1.00 mg/dL   Calcium 8.1 (L) 8.9 - 10.3 mg/dL   GFR calc non Af Amer 17 (L) >60 mL/min   GFR calc Af Amer 20 (L) >60 mL/min   Anion gap 9 5 - 15  Troponin I     Status: None   Collection Time: 08/14/15  1:14 AM  Result Value Ref Range   Troponin I 0.03 <0.031 ng/mL  Lactic acid, plasma     Status: None   Collection Time: 08/14/15  2:36  AM  Result Value Ref Range   Lactic Acid, Venous 1.8 0.5 - 2.0 mmol/L  MRSA PCR Screening     Status: Abnormal   Collection Time: 08/14/15  5:43 AM  Result Value Ref Range   MRSA by PCR POSITIVE (A) NEGATIVE  Glucose, capillary     Status: Abnormal   Collection Time: 08/14/15  7:29 AM  Result Value Ref Range   Glucose-Capillary 275 (H) 65 - 99 mg/dL  Glucose, capillary     Status: Abnormal   Collection Time: 08/14/15 11:27 AM  Result Value Ref Range   Glucose-Capillary 159 (H) 65 - 99 mg/dL  Lactate dehydrogenase (CSF, pleural or peritoneal fluid)     Status: Abnormal   Collection Time: 08/14/15 11:34 AM  Result Value Ref Range   LD, Fluid 83 (H) 3 - 23 U/L   Fluid Type-FLDH Pleural Fld   Protein, pleural or peritoneal fluid     Status: None   Collection Time: 08/14/15 11:34 AM  Result Value Ref Range   Total protein, fluid <3.0 g/dL   Fluid Type-FTP Pleural Fld   Body fluid cell count with  differential     Status: Abnormal   Collection Time: 08/14/15 11:34 AM  Result Value Ref Range   Fluid Type-FCT PLEURAL    Color, Fluid AMBER (A) YELLOW   Appearance, Fluid CLOUDY (A) CLEAR   WBC, Fluid 980 cu mm   Neutrophil Count, Fluid 40 %   Lymphs, Fluid 49 %   Monocyte-Macrophage-Serous Fluid 11 %   Eos, Fluid 0 %   Other Cells, Fluid 0 %  Procalcitonin - Baseline     Status: None   Collection Time: 08/14/15  3:19 PM  Result Value Ref Range   Procalcitonin 0.52 ng/mL  Glucose, capillary     Status: None   Collection Time: 08/14/15  4:12 PM  Result Value Ref Range   Glucose-Capillary 92 65 - 99 mg/dL      Recent Labs  08/14/15 0114  WBC 11.6*  HGB 8.9*  HCT 27.1*  PLT 121*   BMET  Recent Labs  08/14/15 0114  NA 137  K 3.1*  CL 95*  CO2 33*  GLUCOSE 114*  BUN 7  CREATININE 2.50*  CALCIUM 8.1*   LFT No results for input(s): PROT, ALBUMIN, AST, ALT, ALKPHOS, BILITOT, BILIDIR, IBILI in the last 72 hours. PT/INR No results for input(s): LABPROT, INR in the last 72 hours. Hepatitis Panel No results for input(s): HEPBSAG, HCVAB, HEPAIGM, HEPBIGM in the last 72 hours. C-Diff No results for input(s): CDIFFTOX in the last 72 hours. No results for input(s): CDIFFPCR in the last 72 hours.   Studies/Results: Dg Chest Port 1 View  08/14/2015   CLINICAL DATA:  81 year old female status post thoracentesis. Initial encounter.  EXAM: PORTABLE CHEST - 1 VIEW  COMPARISON:  0117 hours today and earlier.  FINDINGS: Portable AP semi upright view at 1236 hours. Significantly reduced volume of right pleural effusion. No pneumothorax. Improved right lung ventilation. Stable right IJ central line. Stable cardiac size and mediastinal contours. Left lower lobe collapse or consolidation, left lung ventilation has not significantly changed.  IMPRESSION: 1. No pneumothorax and improved right lung ventilation following thoracentesis. 2. Left lower lobe collapse or consolidation is stable  to mildly progressed.   Electronically Signed   By: Genevie Ann M.D.   On: 08/14/2015 14:02   Dg Chest Portable 1 View  08/14/2015   CLINICAL DATA:  Acute onset of shortness of breath  and generalized numbness. Initial encounter.  EXAM: PORTABLE CHEST - 1 VIEW  COMPARISON:  Chest radiograph performed 08/05/2015, and CT of the chest performed 811 1,016  FINDINGS: A moderate to large right-sided pleural effusion is noted, with right-sided airspace opacification. Given the appearance on prior CT, this is concerning for worsening aspiration pneumonia. The left lung appears clear. No pneumothorax is seen.  The cardiomediastinal silhouette is mildly enlarged, though not fully characterized. A right IJ line is noted ending about the distal SVC. No acute osseous abnormalities are identified.  IMPRESSION: Moderate to large right-sided pleural effusion, with right-sided airspace opacification. Given the appearance on prior CT, this is concerning for worsening aspiration pneumonia. Mild cardiomegaly noted.   Electronically Signed   By: Garald Balding M.D.   On: 08/14/2015 02:13    Scheduled Inpatient Medications:   . allopurinol  50 mg Oral Daily  . amiodarone  100 mg Oral Daily  . aspirin EC  81 mg Oral Daily  . calcium acetate  1,334 mg Oral TID WC  . cefTAZidime (FORTAZ)  IV  1 g Intravenous Q24H  . Chlorhexidine Gluconate Cloth  6 each Topical Q0600  . cholecalciferol  1,000 Units Oral Daily  . cinacalcet  30 mg Oral Q breakfast  . fluticasone  2 spray Each Nare QHS  . heparin  5,000 Units Subcutaneous 3 times per day  . insulin aspart  0-9 Units Subcutaneous TID WC  . ipratropium-albuterol  3 mL Nebulization QID  . mirtazapine  7.5 mg Oral QHS  . multivitamin with minerals  1 tablet Oral Daily  . mupirocin ointment  1 application Nasal BID  . pantoprazole (PROTONIX) IV  40 mg Intravenous Q12H  . sevelamer carbonate  1,600 mg Oral TID WC  . sodium chloride  3 mL Intravenous Q12H  . tiZANidine  4 mg Oral  BID  . [START ON 08/15/2015] vancomycin  1,000 mg Intravenous Q M,W,F-HD    Continuous Inpatient Infusions:   . norepinephrine (LEVOPHED) Adult infusion 20.053 mcg/min (08/14/15 1600)    PRN Inpatient Medications:  acetaminophen **OR** acetaminophen, benzonatate, docusate sodium, ondansetron **OR** ondansetron (ZOFRAN) IV  Miscellaneous:   Assessment:  1. Current issues with nausea and vomiting. [name] adamantly denies dysphagia or variance thereof. sHe has no Heartburn. Concern for possible primary esophageal dysmotility, possible gastroparesis secondary to her diabetes. 2. Possible cirrhosis noted on imaging studies  Plan:  1. Continue PPIs you are 2. Will await speech evaluation. Bedside evaluation will be of benefit initially however I feel she will likely need to have a modified barium swallow at some point. Currently this would be difficult to do. Her respiratory status includes EGD at this point. This may improve once her pneumonia is treated enough to allow sedated procedure. If not upper GI series may be an alternative depending on the results of the speech evaluation. 3. She may also need to have a gastric emptying study done. Again this evaluation will depend on short-term improvement of her general status. 4. Following with you  Lollie Sails MD 08/14/2015, 7:45 PM  Please note Mr. take was corrected in this note above. Patient was seen for nausea vomiting and possible dysphagia in relation to recurrent aspiration pneumonia, not for GI bleed.

## 2015-08-14 NOTE — ED Notes (Signed)
Pt brought in by ems from [facility] for shob, ems states no shob noted at this time but does have a recent admission for pneumonia.  Pt co numbness throughout, no co pain, fsbs per ems was 26.  1 amp of d50 given per triple lumen to right neck that was started at [facility] prior to being discharged to nursing home.  Pt awake and alert at this time, pt has dialysis shunt to left arm, had normal treatment Wednesday, also co increased swelling to LE bilat.

## 2015-08-14 NOTE — Progress Notes (Signed)
Initial Nutrition Assessment  Documentation Code: Severe malnutrition in context of acute illness   INTERVENTION:  Meals and snacks: Cater to pt preferences. Will add orange sherbet to trays. Nutrition Supplement Therapy: Will add magic cup TID for added nutrition. Pt reports ensure, boost other supplements make her gag   NUTRITION DIAGNOSIS:   Inadequate oral intake related to poor appetite, nausea as evidenced by per patient/family report, energy intake < or equal to 50% for > or equal to 5 days.    GOAL:   Patient will meet greater than or equal to 90% of their needs    MONITOR:    (Energy intake, Electrolyte and renal profile, Glucose profile)  REASON FOR ASSESSMENT:   Other (Comment) (renal diet)    ASSESSMENT:      Pt admitted with bilateral aspiration pneumonia, sepsis  Past Medical History  Diagnosis Date  . Skin cancer     Resected from legs  . Renal insufficiency     Patient is on dialysis and normal days are M,W and F.  . Diabetes mellitus without complication     Patient takes Insulin  . ESRD (end stage renal disease)     Monday, wednesday, Friday DIalysis  . HTN (hypertension)   . Essential tremor   . OSA (obstructive sleep apnea)   . A-fib     not on anticoagulation  . Bilateral lower extremity edema   . Osteoarthritis   . Dysrhythmia   . CHF (congestive heart failure)   . Anemia     Current Nutrition: nothing eaten for breakfast this am   Food/Nutrition-Related History: Pt report poor po intake especially over the past week, eating bites of meals.  Appetite has been decreased since March 2016 but worse over the last week secondary to nausea, vomiting, no appetite   Medications: novolin, remeron, sensipar, phoslo, vit D, aspart MVI, colace, levophed  Electrolyte/Renal Profile and Glucose Profile:   Recent Labs Lab 08/08/15 0900 08/11/15 1234 08/14/15 0114  NA 132* 124* 137  K 5.0 5.1 3.1*  CL 93* 85* 95*  CO2 29 28 33*  BUN  19 24* 7  CREATININE 4.25* 4.89* 2.50*  CALCIUM 8.1* 8.6* 8.1*  PHOS  --  4.0  --   GLUCOSE 205* 230* 114*   Protein Profile:   Recent Labs Lab 08/11/15 1234  ALBUMIN 2.3*    Gastrointestinal Profile: Last BM: unknown   Nutrition-Focused Physical Exam Findings: Nutrition-Focused physical exam completed. Findings are no fat depletion, no muscle depletion, and  severe edema.      Weight Change: Noted 7% weight loss in the last week prior to admission per wt encounters  .   Diet Order:  Diet renal/carb modified with fluid restriction Diet-HS Snack?: Nothing; Room service appropriate?: Yes; Fluid consistency:: Thin  Skin:   reviewed   Height:   Ht Readings from Last 1 Encounters:  08/14/15 5\' 1"  (1.549 m)    Weight:   Wt Readings from Last 1 Encounters:  08/14/15 229 lb (103.874 kg)     BMI:  Body mass index is 43.29 kg/(m^2).  Estimated Nutritional Needs:   Kcal:  1502-1775 kcals (BEE 1050-1.3 AF, 1.1-1.3 IF) Using IBW of 48kg  Protein:  58-72 g (1.2-1.5 g/kg)   Fluid:  1061ml plus UOP  EDUCATION NEEDS:   No education needs identified at this time   HIGH Care Level  [name], [place], [place] (pager)

## 2015-08-14 NOTE — Procedures (Signed)
Thoracentesis Procedure Note  Pre-operative Diagnosis: Large R pleural effusion  Post-operative Diagnosis: same  Indications: Large R pleural effusion  Procedure Details  Consent: Informed consent was obtained. Risks of the procedure were discussed including: infection, bleeding, pain, pneumothorax.  Under sterile conditions the patient was positioned. Betadine solution and sterile drapes were utilized.  1% buffered lidocaine was used to anesthetize the 7th rib space. Fluid was obtained without any difficulties and minimal blood loss.  A dressing was applied to the wound and wound care instructions were provided.   Findings 1200 ml of amber pleural fluid was obtained. A sample was sent chemistries, cell counts, micro, cytology  Complications:  None; patient tolerated the procedure well.          Condition: stable  Plan A follow up chest x-ray was ordered.   Merton Border, MD PCCM service Mobile [phone] Pager [phone]

## 2015-08-14 NOTE — Progress Notes (Signed)
eLink Physician-Brief Progress Note Patient Name: [name] DOB: [birth year] MRN: [id number]   Date of Service  08/14/2015  HPI/Events of Note  Pneumonia with right effusion.   eICU Interventions  Recommend broadened HCAP coverage, and may need thoracentesis to r/o empyema.         Laverle Hobby 08/14/2015, 6:23 AM

## 2015-08-14 NOTE — ED Notes (Signed)
Dr. Reece Levy made aware of patient's persistent HYPOtension despite IVF bolus. MD with [facility] to start patient on Dopamine infusion at 76mcg/kg/min. Order to be carried by this RN.

## 2015-08-14 NOTE — Consult Note (Signed)
Consultation  Referring Provider:     Dr Sabra Heck Admit date 08/14/15 Consult date        08/14/15 Reason for Consultation:    Nausea, vomiting, possible aspiration          HPI:   [name] is a 81 y.o. female with complicated health history, including esrd, dm, frequent hospitalizations this year, CAP, and below issues, admitted with recurrent pneumonia (see H&P), hypoglycemia, AMS.  Gi wise- patient reports issues with NV since last March when she fractured her leg and was hospitalized. Reports various things- smells of food or nonfood items/solid foods trigger nausea. Sometimes vomits foodstuffs, but tolerates items such as jello, magic cup, sherbert, and yogurt well. States she has done this once over the last week, but has been more frequent in the past. Denies abdominal pain, heartburn/reflux. States an episode of diarrhea last week, but normally moves her bowels well, stools both dark and light brown. Feels like she swallows well. Denies further GI related complaints. I do not see where she has had speech eval. It has been 56yr since her last endoscopies PREVIOUS ENDOSCOPIES: EGD 2006 normal, Colonoscopy 2006 with a few avms in the cecum         Noted hgb 8.9 with macrocytic red cells, wbc 11.6, platelets somewhat low. There was a ct of the abdomen/pelvis 07/24/15 that suggested a cirrhotic liver, however spleen was unremarkable and esophagus noted to be normal.  There were no acute GI issues. Did show her pneumonia and atherolsclerosis.   Past Medical History  Diagnosis Date  . Skin cancer     Resected from legs  . Renal insufficiency     Patient is on dialysis and normal days are M,W and F.  . Diabetes mellitus without complication     Patient takes Insulin  . ESRD (end stage renal disease)     Monday, wednesday, Friday DIalysis  . HTN (hypertension)   . Essential tremor   . OSA (obstructive sleep apnea)   . A-fib     not on anticoagulation  . Bilateral lower extremity edema    . Osteoarthritis   . Dysrhythmia   . CHF (congestive heart failure)   . Anemia     Past Surgical History  Procedure Laterality Date  . Abdominal hysterectomy    . Cholecystectomy    . Femur fracture surgery      left femur  . Tubal ligation    . Ankle fracture surgery      right ankle  . Neuroplasty / transposition median nerve at carpal tunnel bilateral    . Knee arthroplasty    . Peripheral vascular catheterization Left 08/06/2015    Procedure: A/V Shuntogram/Fistulagram;  Surgeon: Algernon Huxley, MD;  Location: [facility];  Service: Cardiovascular;  Laterality: Left;    Family History  Problem Relation Age of Onset  . Diabetes type II Father    Brother with GERD but no significant family GI history   Social History  Substance Use Topics  . Smoking status: Never Smoker   . Smokeless tobacco: Never Used  . Alcohol Use: No    Prior to Admission medications   Medication Sig Start Date End Date Taking? Authorizing Provider  acetaminophen (TYLENOL) 325 MG tablet Take 2 tablets (650 mg total) by mouth every 6 (six) hours as needed for mild pain (or Fever >/= 101). 08/08/15   Adrian Prows, MD  allopurinol (ZYLOPRIM) 100 MG tablet Take 50 mg by mouth daily.  07/16/15   Historical Provider, MD  amiodarone (PACERONE) 100 MG tablet Take 1 tablet (100 mg total) by mouth daily. 08/08/15   Adrian Prows, MD  benzonatate (TESSALON) 100 MG capsule Take 1 capsule (100 mg total) by mouth 3 (three) times daily as needed for cough. 08/08/15   Adrian Prows, MD  calcium acetate (PHOSLO) 667 MG capsule Take 2 capsules (1,334 mg total) by mouth 3 (three) times daily with meals. 08/08/15   Adrian Prows, MD  cefTRIAXone 1 g in dextrose 5 % 50 mL Inject 1 g into the vein daily. 08/08/15   Adrian Prows, MD  Cholecalciferol (VITAMIN D3) 1000 UNITS CAPS Take 1,000 Units by mouth daily.    Historical Provider, MD  clindamycin (CLEOCIN) 300 MG capsule Take 1 capsule (300 mg total) by  mouth 3 (three) times daily. 08/08/15   Adrian Prows, MD  dextromethorphan (DELSYM) 30 MG/5ML liquid Take 5 mLs (30 mg total) by mouth 2 (two) times daily. 08/08/15   Adrian Prows, MD  diltiazem (CARDIZEM CD) 180 MG 24 hr capsule Take 1 capsule (180 mg total) by mouth daily. 08/11/15   Rusty Aus, MD  docusate sodium (COLACE) 100 MG capsule Take 1 capsule (100 mg total) by mouth 2 (two) times daily as needed for mild constipation. 08/08/15   Adrian Prows, MD  fluticasone [facility]) 50 MCG/ACT nasal spray Place 2 sprays into both nostrils at bedtime. 07/26/15   Historical Provider, MD  guaiFENesin (MUCINEX) 600 MG 12 hr tablet Take 1 tablet (600 mg total) by mouth 2 (two) times daily. 08/08/15   Adrian Prows, MD  HYDROcodone-acetaminophen (NORCO/VICODIN) 5-325 MG per tablet Take 1 tablet by mouth every 6 (six) hours as needed. For pain. 08/08/15   Adrian Prows, MD  insulin aspart protamine- aspart (NOVOLOG MIX 70/30) (70-30) 100 UNIT/ML injection Inject 15-40 Units into the skin 2 (two) times daily with a meal. Depending on blood sugar.    Historical Provider, MD  ipratropium-albuterol (DUONEB) 0.5-2.5 (3) MG/3ML SOLN Take 3 mLs by nebulization 4 (four) times daily. 08/08/15   Adrian Prows, MD  meclizine (ANTIVERT) 25 MG tablet Take 25 mg by mouth 3 (three) times daily as needed. For dizziness.    Historical Provider, MD  mirtazapine (REMERON) 7.5 MG tablet Take 7.5 mg by mouth at bedtime. 07/31/15   Historical Provider, MD  Multiple Vitamin (DAILY VITE PO) Take 1 tablet by mouth daily.    Historical Provider, MD  ondansetron (ZOFRAN) 4 MG/2ML SOLN injection Inject 2 mLs (4 mg total) into the vein every 6 (six) hours as needed for nausea or vomiting. 08/08/15   Adrian Prows, MD  pantoprazole (PROTONIX) 40 MG tablet Take 40 mg by mouth daily. 07/10/15   Historical Provider, MD  promethazine (PHENERGAN) 25 MG tablet Take 25 mg by mouth 2 (two) times daily as needed. For nausea/vomiting.  07/07/15   Historical Provider, MD  ranitidine (ZANTAC) 300 MG tablet Take 300 mg by mouth daily. 07/09/15   Historical Provider, MD  RENVELA 800 MG tablet Take 1,600 mg by mouth 3 (three) times daily with meals. And with snacks. 06/30/15   Historical Provider, MD  SENSIPAR 30 MG tablet Take 30 mg by mouth daily. 07/30/15   Historical Provider, MD  sevelamer carbonate (RENVELA) 800 MG tablet Take 2 tablets (1,600 mg total) by mouth 3 (three) times daily with meals. 08/08/15   Adrian Prows, MD  tiZANidine (ZANAFLEX) 4 MG tablet Take 4 mg by mouth 2 (two) times daily. 07/03/15  Historical Provider, MD  vancomycin (VANCOCIN) 1 GM/200ML SOLN Inject 200 mLs (1,000 mg total) into the vein every Monday, Wednesday, and Friday with hemodialysis. 08/11/15   Rusty Aus, MD    Current Facility-Administered Medications  Medication Dose Route Frequency Provider Last Rate Last Dose  . acetaminophen (TYLENOL) tablet 650 mg  650 mg Oral Q6H PRN Juluis Mire, MD       Or  . acetaminophen (TYLENOL) suppository 650 mg  650 mg Rectal Q6H PRN Juluis Mire, MD      . allopurinol (ZYLOPRIM) tablet 50 mg  50 mg Oral Daily Juluis Mire, MD   50 mg at 08/14/15 0906  . amiodarone (PACERONE) tablet 100 mg  100 mg Oral Daily Juluis Mire, MD   100 mg at 08/14/15 0908  . aspirin EC tablet 81 mg  81 mg Oral Daily Juluis Mire, MD   81 mg at 08/14/15 0907  . benzonatate (TESSALON) capsule 100 mg  100 mg Oral TID PRN Juluis Mire, MD      . calcium acetate (PHOSLO) capsule 1,334 mg  1,334 mg Oral TID WC Juluis Mire, MD   1,334 mg at 08/14/15 0906  . cefTAZidime (FORTAZ) 1 g in dextrose 5 % 50 mL IVPB  1 g Intravenous Q24H Wilhelmina Mcardle, MD   1 g at 08/14/15 1430  . Chlorhexidine Gluconate Cloth 2 % PADS 6 each  6 each Topical Q0600 Juluis Mire, MD   6 each at 08/14/15 1123  . cholecalciferol (VITAMIN D) tablet 1,000 Units  1,000 Units Oral Daily Juluis Mire, MD   1,000 Units at 08/14/15  0907  . cinacalcet (SENSIPAR) tablet 30 mg  30 mg Oral Q breakfast Juluis Mire, MD   30 mg at 08/14/15 0905  . docusate sodium (COLACE) capsule 100 mg  100 mg Oral BID PRN Juluis Mire, MD      . fluticasone (FLONASE) 50 MCG/ACT nasal spray 2 spray  2 spray Each Nare QHS Juluis Mire, MD      . heparin injection 5,000 Units  5,000 Units Subcutaneous 3 times per day Juluis Mire, MD   5,000 Units at 08/14/15 1334  . insulin aspart (novoLOG) injection 0-9 Units  0-9 Units Subcutaneous TID WC Juluis Mire, MD   2 Units at 08/14/15 1334  . ipratropium-albuterol (DUONEB) 0.5-2.5 (3) MG/3ML nebulizer solution 3 mL  3 mL Nebulization QID Juluis Mire, MD   3 mL at 08/14/15 1448  . mirtazapine (REMERON) tablet 7.5 mg  7.5 mg Oral QHS Juluis Mire, MD      . multivitamin with minerals tablet 1 tablet  1 tablet Oral Daily Juluis Mire, MD   1 tablet at 08/14/15 [phone]  . mupirocin ointment (BACTROBAN) 2 % 1 application  1 application Nasal BID Juluis Mire, MD      . norepinephrine (LEVOPHED) 16 mg in dextrose 5 % 250 mL (0.064 mg/mL) infusion  0-40 mcg/min Intravenous Continuous Wilhelmina Mcardle, MD 18.8 mL/hr at 08/14/15 1200 20.053 mcg/min at 08/14/15 1200  . ondansetron (ZOFRAN) tablet 4 mg  4 mg Oral Q6H PRN Juluis Mire, MD       Or  . ondansetron [facility]) injection 4 mg  4 mg Intravenous Q6H PRN Juluis Mire, MD      . pantoprazole (PROTONIX) injection 40 mg  40 mg Intravenous Q12H Rusty Aus, MD   40 mg  at 08/14/15 1335  . sevelamer carbonate (RENVELA) tablet 1,600 mg  1,600 mg Oral TID WC Juluis Mire, MD   1,600 mg at 08/14/15 0905  . sodium chloride 0.9 % injection 3 mL  3 mL Intravenous Q12H Juluis Mire, MD   3 mL at 08/14/15 0910  . tiZANidine (ZANAFLEX) tablet 4 mg  4 mg Oral BID Juluis Mire, MD   4 mg at 08/14/15 0907  . [START ON 08/15/2015] vancomycin (VANCOCIN) IVPB 1000 mg/200 mL premix  1,000 mg Intravenous Q M,W,F-HD Juluis Mire, MD        Allergies as of 08/14/2015 - Review Complete 08/14/2015  Allergen Reaction Noted  . Angiotensin receptor blockers  08/02/2015  . Penicillins Rash 07/24/2015     Review of Systems:    All systems reviewed and negative except where noted in HPI. Did undergo thoracentesis today with Dr Alva Garnet. Has also been consulted by Dr Ola Spurr  (ID).      Physical Exam:  Vital signs in last 24 hours: Temp:  [92.6 F (33.7 C)-99 F (37.2 C)] 99 F (37.2 C) (09/01 1530) Pulse Rate:  [65-90] 88 (09/01 1530) Resp:  [18-33] 27 (09/01 1530) BP: (70-146)/(27-83) 120/51 mmHg (09/01 1530) SpO2:  [89 %-100 %] 100 % (09/01 1530) Weight:  [103.874 kg (229 lb)] 103.874 kg (229 lb) (09/01 0107)   General:   Pleasant obese in NAD, however appears ill Head:  Normocephalic and atraumatic. Eyes:   No icterus.   Conjunctiva pink. Ears:  Normal auditory acuity. Mouth: Mucosa pink moist, no lesions. Neck:  Supple; no masses felt Lungs: Respirations even and unlabored, somewhat shallow. Lungs coarse bilaterally, moist cough Heart:  S1S2, RRR, no MRG. Genmeralized 1+ edema. Abdomen:   Obese, soft, nondistended, nontender. Normal bowel sounds. No appreciable masses or hepatomegaly. No rebound signs or other peritoneal signs.  Msk:  MAEW x4, No clubbing or cyanosis. Strength 4/5. Symmetrical without gross deformities. Neurologic:  Alert and  oriented x4;  Cranial nerves II-XII intact.  Skin:  ScAttered ecchymosis. Warm, dry, pink without significant lesions or rashes. Psych:  Alert and cooperative. Normal affect.  LAB RESULTS:  Recent Labs  08/14/15 0114  WBC 11.6*  HGB 8.9*  HCT 27.1*  PLT 121*   BMET  Recent Labs  08/14/15 0114  NA 137  K 3.1*  CL 95*  CO2 33*  GLUCOSE 114*  BUN 7  CREATININE 2.50*  CALCIUM 8.1*   LFT No results for input(s): PROT, ALBUMIN, AST, ALT, ALKPHOS, BILITOT, BILIDIR, IBILI in the last 72 hours. PT/INR No results for input(s): LABPROT, INR  in the last 72 hours.  STUDIES: Dg Chest Port 1 View  08/14/2015   CLINICAL DATA:  81 year old female status post thoracentesis. Initial encounter.  EXAM: PORTABLE CHEST - 1 VIEW  COMPARISON:  0117 hours today and earlier.  FINDINGS: Portable AP semi upright view at 1236 hours. Significantly reduced volume of right pleural effusion. No pneumothorax. Improved right lung ventilation. Stable right IJ central line. Stable cardiac size and mediastinal contours. Left lower lobe collapse or consolidation, left lung ventilation has not significantly changed.  IMPRESSION: 1. No pneumothorax and improved right lung ventilation following thoracentesis. 2. Left lower lobe collapse or consolidation is stable to mildly progressed.   Electronically Signed   By: Genevie Ann M.D.   On: 08/14/2015 14:02   Dg Chest Portable 1 View  08/14/2015   CLINICAL DATA:  Acute onset of shortness of breath and generalized  numbness. Initial encounter.  EXAM: PORTABLE CHEST - 1 VIEW  COMPARISON:  Chest radiograph performed 08/05/2015, and CT of the chest performed 811 1,016  FINDINGS: A moderate to large right-sided pleural effusion is noted, with right-sided airspace opacification. Given the appearance on prior CT, this is concerning for worsening aspiration pneumonia. The left lung appears clear. No pneumothorax is seen.  The cardiomediastinal silhouette is mildly enlarged, though not fully characterized. A right IJ line is noted ending about the distal SVC. No acute osseous abnormalities are identified.  IMPRESSION: Moderate to large right-sided pleural effusion, with right-sided airspace opacification. Given the appearance on prior CT, this is concerning for worsening aspiration pneumonia. Mild cardiomegaly noted.   Electronically Signed   By: Garald Balding M.D.   On: 08/14/2015 02:13       Impression / Plan:   1. Recurrent nausea. Intermittent vomiting. Suspect this is multifactorial given her ESRD, DM, medications. Vomiting has  improved over the last week by her report. As there is concern for aspiration, would also recommend evaluation by speech pathologist. Certainly their is a role for further GI evaluation as patient has risk factors for gastroparesis, PUD, etc- however prior to sedated procedures would like her health optimized. Agree with PPI therapy. Would have small, soft v. pureed, easy to digest meals as patient states she tolerates these items well- await diet recommendations from speech therapist. Will discuss further with Dr Gustavo Lah. May need further eval for possible cirrhosis at some point.  Thank you very much for this consult. These services were provided by Stephens November, NP-C, in collaboration with Lollie Sails, MD, with whom I have discussed this patient in full.   Stephens November, NP-C

## 2015-08-14 NOTE — H&P (Signed)
[facility] NAME: [name]    MR#:  [id number]  DATE OF BIRTH:  1934/09/27  DATE OF ADMISSION:  08/14/2015  PRIMARY CARE PHYSICIAN: Rusty Aus., MD   REQUESTING/REFERRING PHYSICIAN: Lurline Hare  CHIEF COMPLAINT:   Chief Complaint  Patient presents with  . Numbness   shortness of breath Generalized weakness Low blood sugar(26 noted by EMS)  HISTORY OF PRESENT ILLNESS:  [name]  is a 81 y.o. female with a known history of below mentioned multiple medical problems, recently discharged from [facility] following ten-day admission for community-acquired pneumonia, discharged on IV antibiotics to rehabilitation, end-stage renal disease on hemodialysis 3 times per week, last hemodialysis on Wednesday was noted to have acute shortness of breath, generalized weakness, poor oral intake and low blood sugar of 26, hence brought to the emergency room for further evaluation. Patient was recently discharged from [facility] on 08/11/2015 on IV antibiotics for treatment of community acquired pneumonia after 10 day hospital stay. No history of fever or chills. Patient was noted to have poor oral intake but continued to have insulin dose as usual. Patient received D50 1 amp of by the EMS following which her blood sugars improved to 200 range but subsequently blood sugar started falling down, hence was started on D5 normal saline drip per ED physician because of poor oral intake. Currently her blood sugars is maintaining in the 120s range. Evaluation in the ED revealed elevated WBC of 11.6, H&H 8.9/27.1, BUN/creatinine 7/2.5. Chest x-ray moderate to large sized right-sided pleural effusion with right-sided airspace opacity consistent with worsening aspiration pneumonia. EKG atrial fibrillation with ventricular rate of 86 bpm. Patient developed a further fall in the blood pressure while in the ED with a blood pressure of 74/39, was  started on IV fluids normal saline following which her blood pressure is trending up and currently maintaining around 99/58. Patient is sleepy but arousable, answering questions appropriately and moving all 4 limbs, states feeling slightly better.  PAST MEDICAL HISTORY:   Past Medical History  Diagnosis Date  . Skin cancer     Resected from legs  . Renal insufficiency     Patient is on dialysis and normal days are M,W and F.  . Diabetes mellitus without complication     Patient takes Insulin  . ESRD (end stage renal disease)     Monday, wednesday, Friday DIalysis  . HTN (hypertension)   . Essential tremor   . OSA (obstructive sleep apnea)   . A-fib     not on anticoagulation  . Bilateral lower extremity edema   . Osteoarthritis   . Dysrhythmia   . CHF (congestive heart failure)   . Anemia     PAST SURGICAL HISTORY:   Past Surgical History  Procedure Laterality Date  . Abdominal hysterectomy    . Cholecystectomy    . Femur fracture surgery      left femur  . Tubal ligation    . Ankle fracture surgery      right ankle  . Neuroplasty / transposition median nerve at carpal tunnel bilateral    . Knee arthroplasty    . Peripheral vascular catheterization Left 08/06/2015    Procedure: A/V Shuntogram/Fistulagram;  Surgeon: Algernon Huxley, MD;  Location: [facility];  Service: Cardiovascular;  Laterality: Left;    SOCIAL HISTORY:   Social History  Substance Use Topics  . Smoking status: Never Smoker   .  Smokeless tobacco: Never Used  . Alcohol Use: No    FAMILY HISTORY:   Family History  Problem Relation Age of Onset  . Diabetes type II Father     DRUG ALLERGIES:   Allergies  Allergen Reactions  . Angiotensin Receptor Blockers     Other reaction(s): Other (See Comments) hyperkalemia  . Penicillins Rash    REVIEW OF SYSTEMS:   Review of Systems  Constitutional: Positive for malaise/fatigue. Negative for fever and chills.  HENT: Negative for ear  pain, hearing loss, nosebleeds, sore throat and tinnitus.   Eyes: Negative for blurred vision, double vision, pain, discharge and redness.  Respiratory: Positive for cough, sputum production and shortness of breath. Negative for hemoptysis and wheezing.   Cardiovascular: Negative for chest pain, palpitations, orthopnea and leg swelling.  Gastrointestinal: Negative for nausea, vomiting, abdominal pain, diarrhea, constipation, blood in stool and melena.       Poor oral intake  Genitourinary: Negative for dysuria, urgency, frequency and hematuria.  Musculoskeletal: Negative for back pain, joint pain and neck pain.  Skin: Negative for itching and rash.  Neurological: Negative for dizziness, tingling, sensory change, focal weakness and seizures.  Endo/Heme/Allergies: Does not bruise/bleed easily.  Psychiatric/Behavioral: Negative for depression. The patient is not nervous/anxious.     MEDICATIONS AT HOME:   Prior to Admission medications   Medication Sig Start Date End Date Taking? Authorizing Provider  acetaminophen (TYLENOL) 325 MG tablet Take 2 tablets (650 mg total) by mouth every 6 (six) hours as needed for mild pain (or Fever >/= 101). 08/08/15   Adrian Prows, MD  allopurinol (ZYLOPRIM) 100 MG tablet Take 50 mg by mouth daily. 07/16/15   Historical Provider, MD  amiodarone (PACERONE) 100 MG tablet Take 1 tablet (100 mg total) by mouth daily. 08/08/15   Adrian Prows, MD  benzonatate (TESSALON) 100 MG capsule Take 1 capsule (100 mg total) by mouth 3 (three) times daily as needed for cough. 08/08/15   Adrian Prows, MD  calcium acetate (PHOSLO) 667 MG capsule Take 2 capsules (1,334 mg total) by mouth 3 (three) times daily with meals. 08/08/15   Adrian Prows, MD  cefTRIAXone 1 g in dextrose 5 % 50 mL Inject 1 g into the vein daily. 08/08/15   Adrian Prows, MD  Cholecalciferol (VITAMIN D3) 1000 UNITS CAPS Take 1,000 Units by mouth daily.    Historical Provider, MD  clindamycin  (CLEOCIN) 300 MG capsule Take 1 capsule (300 mg total) by mouth 3 (three) times daily. 08/08/15   Adrian Prows, MD  dextromethorphan (DELSYM) 30 MG/5ML liquid Take 5 mLs (30 mg total) by mouth 2 (two) times daily. 08/08/15   Adrian Prows, MD  diltiazem (CARDIZEM CD) 180 MG 24 hr capsule Take 1 capsule (180 mg total) by mouth daily. 08/11/15   Rusty Aus, MD  docusate sodium (COLACE) 100 MG capsule Take 1 capsule (100 mg total) by mouth 2 (two) times daily as needed for mild constipation. 08/08/15   Adrian Prows, MD  fluticasone [facility]) 50 MCG/ACT nasal spray Place 2 sprays into both nostrils at bedtime. 07/26/15   Historical Provider, MD  guaiFENesin (MUCINEX) 600 MG 12 hr tablet Take 1 tablet (600 mg total) by mouth 2 (two) times daily. 08/08/15   Adrian Prows, MD  HYDROcodone-acetaminophen (NORCO/VICODIN) 5-325 MG per tablet Take 1 tablet by mouth every 6 (six) hours as needed. For pain. 08/08/15   Adrian Prows, MD  insulin aspart protamine- aspart (NOVOLOG MIX 70/30) (70-30) 100 UNIT/ML injection Inject 15-40  Units into the skin 2 (two) times daily with a meal. Depending on blood sugar.    Historical Provider, MD  ipratropium-albuterol (DUONEB) 0.5-2.5 (3) MG/3ML SOLN Take 3 mLs by nebulization 4 (four) times daily. 08/08/15   Adrian Prows, MD  meclizine (ANTIVERT) 25 MG tablet Take 25 mg by mouth 3 (three) times daily as needed. For dizziness.    Historical Provider, MD  mirtazapine (REMERON) 7.5 MG tablet Take 7.5 mg by mouth at bedtime. 07/31/15   Historical Provider, MD  Multiple Vitamin (DAILY VITE PO) Take 1 tablet by mouth daily.    Historical Provider, MD  ondansetron (ZOFRAN) 4 MG/2ML SOLN injection Inject 2 mLs (4 mg total) into the vein every 6 (six) hours as needed for nausea or vomiting. 08/08/15   Adrian Prows, MD  pantoprazole (PROTONIX) 40 MG tablet Take 40 mg by mouth daily. 07/10/15   Historical Provider, MD  promethazine (PHENERGAN) 25 MG tablet Take 25 mg by  mouth 2 (two) times daily as needed. For nausea/vomiting. 07/07/15   Historical Provider, MD  ranitidine (ZANTAC) 300 MG tablet Take 300 mg by mouth daily. 07/09/15   Historical Provider, MD  RENVELA 800 MG tablet Take 1,600 mg by mouth 3 (three) times daily with meals. And with snacks. 06/30/15   Historical Provider, MD  SENSIPAR 30 MG tablet Take 30 mg by mouth daily. 07/30/15   Historical Provider, MD  sevelamer carbonate (RENVELA) 800 MG tablet Take 2 tablets (1,600 mg total) by mouth 3 (three) times daily with meals. 08/08/15   Adrian Prows, MD  tiZANidine (ZANAFLEX) 4 MG tablet Take 4 mg by mouth 2 (two) times daily. 07/03/15   Historical Provider, MD  vancomycin (VANCOCIN) 1 GM/200ML SOLN Inject 200 mLs (1,000 mg total) into the vein every Monday, Wednesday, and Friday with hemodialysis. 08/11/15   Rusty Aus, MD      VITAL SIGNS:  Blood pressure 73/44, pulse 70, temperature 98.2 F (36.8 C), temperature source Oral, resp. rate 18, height 5\' 1"  (1.549 m), weight 103.874 kg (229 lb), SpO2 100 %.  PHYSICAL EXAMINATION:  Physical Exam  Constitutional: She is oriented to person, place, and time. She appears well-developed and well-nourished.  HENT:  Head: Normocephalic and atraumatic.  Right Ear: External ear normal.  Left Ear: External ear normal.  Nose: Nose normal.  Mouth/Throat: Oropharynx is clear and moist. No oropharyngeal exudate.  Eyes: EOM are normal. Pupils are equal, round, and reactive to light. No scleral icterus.  Neck: Normal range of motion. Neck supple. No JVD present. No thyromegaly present.  Cardiovascular: Normal rate, normal heart sounds and intact distal pulses.  Exam reveals no friction rub.   No murmur heard. Irregularly irregular rhythm  Respiratory: Effort normal. She has wheezes. She has rales. She exhibits no tenderness.  GI: Soft. Bowel sounds are normal. She exhibits no distension and no mass. There is no tenderness. There is no rebound and no guarding.   Musculoskeletal: Normal range of motion. She exhibits edema.  Lymphadenopathy:    She has no cervical adenopathy.  Neurological: She is alert and oriented to person, place, and time. She has normal reflexes. She displays normal reflexes. No cranial nerve deficit. She exhibits normal muscle tone.  Somnolent but easily arousable and responding to questions appropriately  Skin: Skin is warm. No rash noted. No erythema.  Psychiatric: She has a normal mood and affect. Her behavior is normal. Thought content normal.   LABORATORY PANEL:   CBC  Recent Labs Lab 08/14/15  0114  WBC 11.6*  HGB 8.9*  HCT 27.1*  PLT 121*   ------------------------------------------------------------------------------------------------------------------  Chemistries   Recent Labs Lab 08/14/15 0114  NA 137  K 3.1*  CL 95*  CO2 33*  GLUCOSE 114*  BUN 7  CREATININE 2.50*  CALCIUM 8.1*   ------------------------------------------------------------------------------------------------------------------  Cardiac Enzymes  Recent Labs Lab 08/14/15 0114  TROPONINI 0.03   ------------------------------------------------------------------------------------------------------------------  RADIOLOGY:  Dg Chest Portable 1 View  08/14/2015   CLINICAL DATA:  Acute onset of shortness of breath and generalized numbness. Initial encounter.  EXAM: PORTABLE CHEST - 1 VIEW  COMPARISON:  Chest radiograph performed 08/05/2015, and CT of the chest performed 811 1,016  FINDINGS: A moderate to large right-sided pleural effusion is noted, with right-sided airspace opacification. Given the appearance on prior CT, this is concerning for worsening aspiration pneumonia. The left lung appears clear. No pneumothorax is seen.  The cardiomediastinal silhouette is mildly enlarged, though not fully characterized. A right IJ line is noted ending about the distal SVC. No acute osseous abnormalities are identified.  IMPRESSION: Moderate to  large right-sided pleural effusion, with right-sided airspace opacification. Given the appearance on prior CT, this is concerning for worsening aspiration pneumonia. Mild cardiomegaly noted.   Electronically Signed   By: Garald Balding M.D.   On: 08/14/2015 02:13    EKG:   Orders placed or performed during the hospital encounter of 08/14/15  . EKG 12-Lead  . EKG 12-Lead  . ED EKG  . ED EKG  Atrial fibrillation with ventricular rate of 86 bpm, nonspecific ST-T abnormality  IMPRESSION AND PLAN:   81 year old female with history of multiple medical problems, recently discharged to rehabilitation on IV antibiotics on Monday following 10 day admission for community-acquired pneumonia was brought in with shortness of breath, generalized weakness, low blood sugars secondary to poor oral intake and continued insulin usage. 1. Worsening pneumonia, recent discharge from [facility] on Monday on IV antibiotics. Current chest x-ray with worsening pneumonia and moderate size pleural effusion-healthcare associated pneumonia, aspiration pneumonia. Plan: Admit to ICU, continue IV antibiotics-vancomycin, ceftriaxone, clindamycin, continue O2 supplementation and follow-up O2 sats, IV fluids, infectious diseases consultation requested, follow-up CBC, blood cultures and sputum cultures. 2. Hypoglycemia secondary to poor oral intake and continued insulin administration. Patient known diabetic. Plan: Continue D5 normal saline because of poor oral intake, follow blood sugars closely. Hold insulin for now. 3. Hypotension secondary to sepsis/pneumonia. Plan: IV fluids, follow BP monitoring closely, consider pressors if BP remains low. 4. End-stage renal disease on HD 3 times per week, last HD on Wednesday. Patient stable. Nephrology consultation placed for continuation of hemodialysis. 5. Diabetes mellitus type 2 on insulin. Hold insulin for now because of low blood sugars, monitor blood sugar closely with sliding scale  insulin. 6. Sleep apnea, on CPAP. Continue same. 7. Chronic atrial fibrillation with controlled ventricular rate, patient stable clinically 8. Hypertension, patient currently hypotensive. Hold antibiotics and medications follow-up blood pressure recordings.    All the records are reviewed and case discussed with ED provider. Management plans discussed with the patient, family and they are in agreement.  CODE STATUS: Full code  TOTAL TIME TAKING CARE OF THIS PATIENT: 50 minutes.    Juluis Mire M.D on 08/14/2015 at 3:37 AM  Between 7am to 6pm - Pager - [phone]  After 6pm go to [email or web address] - password [email or web address] [facility]  [facility]  Office  [phone]  CC: Primary care physician; Rusty Aus., MD

## 2015-08-14 NOTE — Care Management (Signed)
Informed that patient has been readmitted from skilled nursing.  May benefit from LTAC consideration

## 2015-08-14 NOTE — Progress Notes (Signed)
[name] is a 81 y.o. female   SUBJECTIVE:  Patient recently hospitalized with bilateral pneumonia, thought due to staph aureus. Was treated with IV clindamycin, IV vancomycin and Rocephin with course complicated by rapid A. fib. Discharge chest x-ray showed substantial improvement. Within 48 hours of discharge the patient had nausea and vomiting with aspiration, hypothermia and hypotension.  ______________________________________________________________________  ROS: Review of systems is unremarkable for any active cardiac,respiratory, GI, GU, hematologic, neurologic or psychiatric systems, 10 systems reviewed.  Marland Kitchen allopurinol  50 mg Oral Daily  . amiodarone  100 mg Oral Daily  . aspirin EC  81 mg Oral Daily  . calcium acetate  1,334 mg Oral TID WC  . [START ON 08/15/2015] cefTRIAXone (ROCEPHIN) IVPB 1 gram/50 mL D5W  1 g Intravenous Q24H  . cholecalciferol  1,000 Units Oral Daily  . cinacalcet  30 mg Oral Q breakfast  . clindamycin (CLEOCIN) IV  600 mg Intravenous 4 times per day  . fluticasone  2 spray Each Nare QHS  . heparin  5,000 Units Subcutaneous 3 times per day  . insulin aspart  0-9 Units Subcutaneous TID WC  . ipratropium  0.5 mg Nebulization Q6H  . ipratropium-albuterol  3 mL Nebulization QID  . mirtazapine  7.5 mg Oral QHS  . multivitamin with minerals  1 tablet Oral Daily  . pantoprazole  40 mg Oral QAC breakfast  . pantoprazole (PROTONIX) IV  40 mg Intravenous Q12H  . sevelamer carbonate  1,600 mg Oral TID WC  . sodium chloride  3 mL Intravenous Q12H  . tiZANidine  4 mg Oral BID  . [START ON 08/15/2015] vancomycin  1,000 mg Intravenous Q M,W,F-HD   acetaminophen **OR** acetaminophen, benzonatate, docusate sodium, ondansetron **OR** ondansetron (ZOFRAN) IV   Past Medical History  Diagnosis Date  . Skin cancer     Resected from legs  . Renal insufficiency     Patient is on dialysis and normal days are M,W and F.  . Diabetes mellitus without complication    Patient takes Insulin  . ESRD (end stage renal disease)     Monday, wednesday, Friday DIalysis  . HTN (hypertension)   . Essential tremor   . OSA (obstructive sleep apnea)   . A-fib     not on anticoagulation  . Bilateral lower extremity edema   . Osteoarthritis   . Dysrhythmia   . CHF (congestive heart failure)   . Anemia     Past Surgical History  Procedure Laterality Date  . Abdominal hysterectomy    . Cholecystectomy    . Femur fracture surgery      left femur  . Tubal ligation    . Ankle fracture surgery      right ankle  . Neuroplasty / transposition median nerve at carpal tunnel bilateral    . Knee arthroplasty    . Peripheral vascular catheterization Left 08/06/2015    Procedure: A/V Shuntogram/Fistulagram;  Surgeon: Algernon Huxley, MD;  Location: [facility];  Service: Cardiovascular;  Laterality: Left;    PHYSICAL EXAM:  BP 120/57 mmHg  Pulse 78  Temp(Src) 92.7 F (33.7 C) (Rectal)  Resp 23  Ht 5\' 1"  (1.549 m)  Wt 103.874 kg (229 lb)  BMI 43.29 kg/m2  SpO2 89%  Wt Readings from Last 3 Encounters:  08/14/15 103.874 kg (229 lb)  08/08/15 97.1 kg (214 lb 1.1 oz)           BP Readings from Last 3 Encounters:  08/14/15 120/57  08/11/15 140/57    Constitutional: NAD Neck: supple, no thyromegaly Respiratory: Rhonchi and wheezing throughout Cardiovascular: RRR, no murmur, no gallop Abdomen: soft, good BS, nontender Extremities: no edema Neuro: alert and oriented, no focal motor or sensory deficits  ASSESSMENT/PLAN:  Labs and imaging studies were reviewed  Aspiration pneumonia with sepsis syndrome-substantial right lung changes, speech evaluation, infectious disease and intensivist, continue IV antibiotics Recurrent nausea and vomiting-GI thoughts, IV Protonix History A. fib-follow closely Hypotension-wean off IV pressors as able Hypoglycemia-sliding scale insulin, no routine insulin ESRD-hemodialysis 3 times weekly

## 2015-08-14 NOTE — ED Notes (Signed)
Pt given a meal tray per MD order

## 2015-08-14 NOTE — ED Notes (Signed)
Patient with persistent HYPOtension despite Dopamine 12mcg/kg/min. MD with order to titrate; gtt rate increased to 77mcg/kg/min. Will continue to monitor.

## 2015-08-14 NOTE — Consult Note (Signed)
[facility]     Reason for Consult:HCAP     Referring Physician: Reece Levy Date of Admission:  08/14/2015   Principal Problem:   Healthcare-associated pneumonia Active Problems:   Acute respiratory failure   Hypoglycemia   Hypotension   ESRD on hemodialysis   DM (diabetes mellitus)   Pleural effusion on right   HPI: [name] is a 81 y.o. female with history of end-stage renal disease on Monday Wednesday Friday dialysis, hypertension, atrial fibrillation not on anticoagulation, insulin-dependent diabetes mellitus, arthritis presents from home secondary to worsening cough and difficulty breathing.  She had recent admission for CAP and was discharged 3 days ago on IV abx - vanco, ceftraixone and clinda.   At last admission she was seen by pulmonary, had thoracentesis of pleural effusion. Sputum cx 8/20 just grew nml flora.  CT chest 8/11 showed extensive LL consolidation concerning for aspiration.  CXR on admission shows R effusion and consolidation  Past Medical History  Diagnosis Date  . Skin cancer     Resected from legs  . Renal insufficiency     Patient is on dialysis and normal days are M,W and F.  . Diabetes mellitus without complication     Patient takes Insulin  . ESRD (end stage renal disease)     Monday, wednesday, Friday DIalysis  . HTN (hypertension)   . Essential tremor   . OSA (obstructive sleep apnea)   . A-fib     not on anticoagulation  . Bilateral lower extremity edema   . Osteoarthritis   . Dysrhythmia   . CHF (congestive heart failure)   . Anemia    Past Surgical History  Procedure Laterality Date  . Abdominal hysterectomy    . Cholecystectomy    . Femur fracture surgery      left femur  . Tubal ligation    . Ankle fracture surgery      right ankle  . Neuroplasty / transposition median nerve at carpal tunnel bilateral    . Knee arthroplasty    . Peripheral vascular catheterization Left 08/06/2015    Procedure: A/V  Shuntogram/Fistulagram;  Surgeon: Algernon Huxley, MD;  Location: [facility];  Service: Cardiovascular;  Laterality: Left;   Social History  Substance Use Topics  . Smoking status: Never Smoker   . Smokeless tobacco: Never Used  . Alcohol Use: No   Family History  Problem Relation Age of Onset  . Diabetes type II Father     Allergies:  Allergies  Allergen Reactions  . Angiotensin Receptor Blockers     Other reaction(s): Other (See Comments) hyperkalemia  . Penicillins Rash    Current antibiotics: Antibiotics Given (last 72 hours)    Date/Time Action Medication Dose Rate   08/14/15 0910 Given   clindamycin (CLEOCIN) IVPB 600 mg 600 mg 100 mL/hr   08/14/15 1124 Given   levofloxacin (LEVAQUIN) IVPB 750 mg 750 mg 100 mL/hr      MEDICATIONS: . allopurinol  50 mg Oral Daily  . amiodarone  100 mg Oral Daily  . aspirin EC  81 mg Oral Daily  . calcium acetate  1,334 mg Oral TID WC  . Chlorhexidine Gluconate Cloth  6 each Topical Q0600  . cholecalciferol  1,000 Units Oral Daily  . cinacalcet  30 mg Oral Q breakfast  . fluticasone  2 spray Each Nare QHS  . heparin  5,000 Units Subcutaneous 3 times per day  . insulin aspart  0-9 Units Subcutaneous  TID WC  . ipratropium-albuterol  3 mL Nebulization QID  . [START ON 08/16/2015] levofloxacin (LEVAQUIN) IV  500 mg Intravenous Q48H  . metronidazole  500 mg Intravenous Q8H  . mirtazapine  7.5 mg Oral QHS  . multivitamin with minerals  1 tablet Oral Daily  . mupirocin ointment  1 application Nasal BID  . pantoprazole (PROTONIX) IV  40 mg Intravenous Q12H  . sevelamer carbonate  1,600 mg Oral TID WC  . sodium chloride  3 mL Intravenous Q12H  . tiZANidine  4 mg Oral BID  . [START ON 08/15/2015] vancomycin  1,000 mg Intravenous Q M,W,F-HD    Review of Systems - 11 systems reviewed and negative per HPI   OBJECTIVE: Temp:  [92.6 F (33.7 C)-98.2 F (36.8 C)] 92.7 F (33.7 C) (09/01 0630) Pulse Rate:  [70-89] 78 (09/01  0630) Resp:  [18-33] 23 (09/01 0630) BP: (70-131)/(39-75) 120/57 mmHg (09/01 0630) SpO2:  [89 %-100 %] 89 % (09/01 0630) Weight:  [103.874 kg (229 lb)] 103.874 kg (229 lb) (09/01 0107) Physical Exam  Constitutional:  oriented to person, place, and time. Obese, chronically ill apperaing HENT: [place]/AT, PERRLA, no scleral icterus Mouth/Throat: Oropharynx is clear and dry. No oropharyngeal exudate.  R neck TLC wnl, LUE AVF wnl Cardiovascular: Normal rate, regular rhythm and normal heart sounds.  Pulmonary/Chest: rhonchi throughout Neck = supple, no nuchal rigidity Abdominal: Soft. obese Bowel sounds are normal.  exhibits no distension. There is no tenderness.  Lymphadenopathy: no cervical adenopathy. No axillary adenopathy Neurological: alert and oriented to person, place, and time.  Skin: Skin is warm and dry. No rash noted. No erythema.  EXt 2+ LE edema Psychiatric: a normal mood and affect.  behavior is normal.    LABS: Results for orders placed or performed during the hospital encounter of 08/14/15 (from the past 48 hour(s))  CBC     Status: Abnormal   Collection Time: 08/14/15  1:14 AM  Result Value Ref Range   WBC 11.6 (H) 3.6 - 11.0 K/uL   RBC 2.62 (L) 3.80 - 5.20 MIL/uL   Hemoglobin 8.9 (L) 12.0 - 16.0 g/dL   HCT 27.1 (L) 35.0 - 47.0 %   MCV 103.5 (H) 80.0 - 100.0 fL   MCH 33.9 26.0 - 34.0 pg   MCHC 32.7 32.0 - 36.0 g/dL   RDW 15.5 (H) 11.5 - 14.5 %   Platelets 121 (L) 150 - 440 K/uL  Basic metabolic panel     Status: Abnormal   Collection Time: 08/14/15  1:14 AM  Result Value Ref Range   Sodium 137 135 - 145 mmol/L   Potassium 3.1 (L) 3.5 - 5.1 mmol/L   Chloride 95 (L) 101 - 111 mmol/L   CO2 33 (H) 22 - 32 mmol/L   Glucose, Bld 114 (H) 65 - 99 mg/dL   BUN 7 6 - 20 mg/dL   Creatinine, Ser 2.50 (H) 0.44 - 1.00 mg/dL   Calcium 8.1 (L) 8.9 - 10.3 mg/dL   GFR calc non Af Amer 17 (L) >60 mL/min   GFR calc Af Amer 20 (L) >60 mL/min    Comment: (NOTE) The eGFR has been  calculated using the CKD EPI equation. This calculation has not been validated in all clinical situations. eGFR's persistently <60 mL/min signify possible Chronic Kidney Disease.    Anion gap 9 5 - 15  Troponin I     Status: None   Collection Time: 08/14/15  1:14 AM  Result Value Ref Range  Troponin I 0.03 <0.031 ng/mL    Comment:        NO INDICATION OF MYOCARDIAL INJURY.   Lactic acid, plasma     Status: None   Collection Time: 08/14/15  2:36 AM  Result Value Ref Range   Lactic Acid, Venous 1.8 0.5 - 2.0 mmol/L  MRSA PCR Screening     Status: Abnormal   Collection Time: 08/14/15  5:43 AM  Result Value Ref Range   MRSA by PCR POSITIVE (A) NEGATIVE    Comment:        The GeneXpert MRSA Assay (FDA approved for NASAL specimens only), is one component of a comprehensive MRSA colonization surveillance program. It is not intended to diagnose MRSA infection nor to guide or monitor treatment for MRSA infections. CRITICAL RESULT CALLED TO, READ BACK BY AND VERIFIED WITH: BYRON HADDOCK AT [facility] ON 08/14/15.[place]   Glucose, capillary     Status: Abnormal   Collection Time: 08/14/15  7:29 AM  Result Value Ref Range   Glucose-Capillary 275 (H) 65 - 99 mg/dL  Glucose, capillary     Status: Abnormal   Collection Time: 08/14/15 11:27 AM  Result Value Ref Range   Glucose-Capillary 159 (H) 65 - 99 mg/dL   No components found for: ESR, C REACTIVE PROTEIN MICRO: Recent Results (from the past 720 hour(s))  Blood culture (routine x 2)     Status: None   Collection Time: 08/02/15 11:59 AM  Result Value Ref Range Status   Specimen Description BLOOD LEFT ASSIST CONTROL  Final   Special Requests   Final    BOTTLES DRAWN AEROBIC AND ANAEROBIC  AER 6CC ANA [place]   Culture NO GROWTH 5 DAYS  Final   Report Status 08/07/2015 FINAL  Final  Blood culture (routine x 2)     Status: None   Collection Time: 08/02/15 12:00 PM  Result Value Ref Range Status   Specimen Description BLOOD LEFT FOREARM   Final   Special Requests BOTTLES DRAWN AEROBIC AND ANAEROBIC  1CC  Final   Culture NO GROWTH 5 DAYS  Final   Report Status 08/07/2015 FINAL  Final  Culture, expectorated sputum-assessment     Status: None   Collection Time: 08/02/15  8:37 PM  Result Value Ref Range Status   Specimen Description SPUTUM  Final   Special Requests Normal  Final   Sputum evaluation THIS SPECIMEN IS ACCEPTABLE FOR SPUTUM CULTURE  Final   Report Status 08/04/2015 FINAL  Final  MRSA PCR Screening     Status: Abnormal   Collection Time: 08/02/15  8:37 PM  Result Value Ref Range Status   MRSA by PCR POSITIVE (A) NEGATIVE Final    Comment:        The GeneXpert MRSA Assay (FDA approved for NASAL specimens only), is one component of a comprehensive MRSA colonization surveillance program. It is not intended to diagnose MRSA infection nor to guide or monitor treatment for MRSA infections. CRITICAL RESULT CALLED TO, READ BACK BY AND VERIFIED WITH: MARCEL TURNER AT 2214 08/02/15.PMH   Culture, respiratory (NON-Expectorated)     Status: None   Collection Time: 08/02/15  8:37 PM  Result Value Ref Range Status   Specimen Description SPUTUM  Final   Special Requests Normal Reflexed from [id number]  Final   Gram Stain   Final    FAIR SPECIMEN - 70-80% WBCS FEW WBC SEEN MANY GRAM POSITIVE COCCI    Culture NORMAL UPPER RESPIRATORY FLORA  Final   Report Status  08/05/2015 FINAL  Final  Body fluid culture     Status: None   Collection Time: 08/04/15  2:51 PM  Result Value Ref Range Status   Specimen Description PLEURAL  Final   Special Requests NONE  Final   Gram Stain NO WBC SEEN NO ORGANISMS SEEN   Final   Culture NO GROWTH 4 DAYS  Final   Report Status 08/08/2015 FINAL  Final  MRSA PCR Screening     Status: Abnormal   Collection Time: 08/14/15  5:43 AM  Result Value Ref Range Status   MRSA by PCR POSITIVE (A) NEGATIVE Final    Comment:        The GeneXpert MRSA Assay (FDA approved for NASAL  specimens only), is one component of a comprehensive MRSA colonization surveillance program. It is not intended to diagnose MRSA infection nor to guide or monitor treatment for MRSA infections. CRITICAL RESULT CALLED TO, READ BACK BY AND VERIFIED WITH: BYRON HADDOCK AT [place] ON 08/14/15.Marland KitchenMarland KitchenMarion General Hospital     IMAGING: Dg Chest 1 View  08/05/2015   CLINICAL DATA:  Right IJ placed  EXAM: CHEST  1 VIEW  COMPARISON:  08/05/2015  FINDINGS: There is a right IJ catheter with tip at the cavoatrial junction. There is moderate cardiac enlargement. Large right pleural effusion is similar in volume to the previous exam. The left lung is relatively clear.  IMPRESSION: Tip of the right IJ catheter is in the cavoatrial junction.  Large right pleural effusion as before.   Electronically Signed   By: Kerby Moors M.D.   On: 08/05/2015 19:51   Dg Chest 2 View  08/03/2015   CLINICAL DATA:  RIGHT pleural effusion.  EXAM: CHEST  2 VIEW  COMPARISON:  08/03/2015.  FINDINGS: Moderate to large RIGHT pleural effusion is present with compressive atelectasis. Cardiopericardial silhouette partially obscured. The cardiopericardial silhouette does appear enlarged. Monitoring leads project over the chest. No LEFT pleural effusion.  IMPRESSION: Moderate to large RIGHT pleural effusion.   Electronically Signed   By: Dereck Ligas M.D.   On: 08/03/2015 15:49   Ct Chest W Contrast  07/24/2015   CLINICAL DATA:  81 year old female with cough since March 2016, worsening over the past 2 weeks, now productive of yellow and yellowish green sputum. Shortness of breath on exertion.  EXAM: CT CHEST, ABDOMEN, AND PELVIS WITH CONTRAST  TECHNIQUE: Multidetector CT imaging of the chest, abdomen and pelvis was performed following the standard protocol during bolus administration of intravenous contrast.  CONTRAST:  18m OMNIPAQUE IOHEXOL 300 MG/ML  SOLN  COMPARISON:  CT the abdomen and pelvis 03/04/2015. CT of the chest 09/16/2013.  FINDINGS: CT CHEST  FINDINGS  Mediastinum/Lymph Nodes: Heart size is enlarged with biatrial dilatation. There is no significant pericardial fluid, thickening or pericardial calcification. Severe tooth thickening calcification of the mitral valve and mitral annulus. There is atherosclerosis of the thoracic aorta, the great vessels of the mediastinum and the coronary arteries, including calcified atherosclerotic plaque in the left main and right coronary arteries. Numerous prominent borderline enlarged mediastinal and bilateral hilar lymph nodes, likely reactive. Esophagus is unremarkable in appearance. No axillary lymphadenopathy.  Lungs/Pleura: Extensive volume loss and airspace consolidation in the lower lobes of the lungs bilaterally. Many of the lower lobe bronchi appear filled with fluid, presumably retained secretions and/or aspirated material. Small bilateral pleural effusions (right greater than left) layering dependently. There is also some patchy peribronchovascular ground-glass attenuation and micronodularity in the dependent portion of the right upper lobe. No other larger  more suspicious appearing pulmonary nodules or masses are identified.  Musculoskeletal/Soft Tissues: There are no aggressive appearing lytic or blastic lesions noted in the visualized portions of the skeleton.  CT ABDOMEN AND PELVIS FINDINGS  Hepatobiliary: The liver has a slightly shrunken appearance and mildly nodular contour, suggestive of underlying cirrhosis. No discrete cystic or solid hepatic lesion. No intra or extrahepatic biliary ductal dilatation. Gallbladder is not visualized, presumably surgically absent (no surgical clips are noted in the gallbladder fossa).  Pancreas: No pancreatic mass. No pancreatic ductal dilatation. No pancreatic or peripancreatic fluid or inflammatory changes.  Spleen: Unremarkable.  Adrenals/Urinary Tract: Multiple small calcifications are noted within the collecting systems of the kidneys bilaterally, likely a  combination of vascular calcifications and nonobstructive calculi. The largest nonobstructive calculus is in the interpolar region of the left kidney measuring 3 mm. Multiple sub cm low-attenuation lesions in the kidneys bilaterally too small to definitively characterize, but are favored to represent tiny cysts. 1.9 cm low-attenuation lesion in the interpolar region of the left kidney is compatible with a simple cyst. Bilateral adrenal glands are normal in appearance. No hydroureteronephrosis. Urinary bladder is normal in appearance.  Stomach/Bowel: The appearance of the stomach is normal. No pathologic dilatation of small bowel or colon.  Vascular/Lymphatic: Atherosclerosis throughout the abdominal and pelvic vasculature, without evidence of aneurysm or dissection. No lymphadenopathy noted in the abdomen or pelvis.  Reproductive: Status post hysterectomy. Ovaries are not confidently identified may be surgically absent or atrophic.  Other: Tiny umbilical hernia containing only omental fat incidentally noted. No significant volume of ascites. No pneumoperitoneum.  Musculoskeletal: Chronic compression fracture of L4 with approximately 30% loss of anterior vertebral body height is unchanged. Cavernous hemangioma and L1 vertebral body again incidentally noted. Old healed fractures of the left superior and inferior pubic rami are incidentally noted. There are no aggressive appearing lytic or blastic lesions noted in the visualized portions of the skeleton.  IMPRESSION: 1. Extensive volume loss and airspace consolidation in the lower lobes of the lungs bilaterally, and to a lesser extent in the posterior aspect of the right upper lobe. Given the patient's chronic symptoms, these findings are concerning for recurrent aspiration with associated aspiration pneumonia, and further evaluation by speech therapy to assess for aspiration is recommended in the near future. 2. Small bilateral pleural effusions layering dependently  (right greater than left). 3. No acute findings in the abdomen or pelvis. 4. Atherosclerosis, including left main and right coronary artery disease. Assessment for potential risk factor modification, dietary therapy or pharmacologic therapy may be warranted, if clinically indicated. 5. The appearance of the liver suggests underlying cirrhosis. 6. Cardiomegaly with biatrial dilatation. 7. There are calcifications of the mitral valve and annulus. Echocardiographic correlation for evaluation of potential valvular dysfunction may be warranted if clinically indicated. 8. Additional incidental findings, as above.   Electronically Signed   By: Vinnie Langton M.D.   On: 07/24/2015 17:29   Dg Chest Right Decubitus  08/03/2015   CLINICAL DATA:  RIGHT pleural effusion. RIGHT side down decubitus radiographs.  EXAM: CHEST - RIGHT DECUBITUS  COMPARISON:  08/03/2015.  FINDINGS: RIGHT pleural effusion is large. Majority of it fusion layers dependently with decubitus positioning. Compressive atelectasis of the RIGHT lung associated with the effusion.  IMPRESSION: Large dependently layering RIGHT pleural effusion.   Electronically Signed   By: Dereck Ligas M.D.   On: 08/03/2015 15:47   Ct Abdomen Pelvis W Contrast  07/24/2015   CLINICAL DATA:  81 year old female with cough  since March 2016, worsening over the past 2 weeks, now productive of yellow and yellowish green sputum. Shortness of breath on exertion.  EXAM: CT CHEST, ABDOMEN, AND PELVIS WITH CONTRAST  TECHNIQUE: Multidetector CT imaging of the chest, abdomen and pelvis was performed following the standard protocol during bolus administration of intravenous contrast.  CONTRAST:  164m OMNIPAQUE IOHEXOL 300 MG/ML  SOLN  COMPARISON:  CT the abdomen and pelvis 03/04/2015. CT of the chest 09/16/2013.  FINDINGS: CT CHEST FINDINGS  Mediastinum/Lymph Nodes: Heart size is enlarged with biatrial dilatation. There is no significant pericardial fluid, thickening or pericardial  calcification. Severe tooth thickening calcification of the mitral valve and mitral annulus. There is atherosclerosis of the thoracic aorta, the great vessels of the mediastinum and the coronary arteries, including calcified atherosclerotic plaque in the left main and right coronary arteries. Numerous prominent borderline enlarged mediastinal and bilateral hilar lymph nodes, likely reactive. Esophagus is unremarkable in appearance. No axillary lymphadenopathy.  Lungs/Pleura: Extensive volume loss and airspace consolidation in the lower lobes of the lungs bilaterally. Many of the lower lobe bronchi appear filled with fluid, presumably retained secretions and/or aspirated material. Small bilateral pleural effusions (right greater than left) layering dependently. There is also some patchy peribronchovascular ground-glass attenuation and micronodularity in the dependent portion of the right upper lobe. No other larger more suspicious appearing pulmonary nodules or masses are identified.  Musculoskeletal/Soft Tissues: There are no aggressive appearing lytic or blastic lesions noted in the visualized portions of the skeleton.  CT ABDOMEN AND PELVIS FINDINGS  Hepatobiliary: The liver has a slightly shrunken appearance and mildly nodular contour, suggestive of underlying cirrhosis. No discrete cystic or solid hepatic lesion. No intra or extrahepatic biliary ductal dilatation. Gallbladder is not visualized, presumably surgically absent (no surgical clips are noted in the gallbladder fossa).  Pancreas: No pancreatic mass. No pancreatic ductal dilatation. No pancreatic or peripancreatic fluid or inflammatory changes.  Spleen: Unremarkable.  Adrenals/Urinary Tract: Multiple small calcifications are noted within the collecting systems of the kidneys bilaterally, likely a combination of vascular calcifications and nonobstructive calculi. The largest nonobstructive calculus is in the interpolar region of the left kidney measuring  3 mm. Multiple sub cm low-attenuation lesions in the kidneys bilaterally too small to definitively characterize, but are favored to represent tiny cysts. 1.9 cm low-attenuation lesion in the interpolar region of the left kidney is compatible with a simple cyst. Bilateral adrenal glands are normal in appearance. No hydroureteronephrosis. Urinary bladder is normal in appearance.  Stomach/Bowel: The appearance of the stomach is normal. No pathologic dilatation of small bowel or colon.  Vascular/Lymphatic: Atherosclerosis throughout the abdominal and pelvic vasculature, without evidence of aneurysm or dissection. No lymphadenopathy noted in the abdomen or pelvis.  Reproductive: Status post hysterectomy. Ovaries are not confidently identified may be surgically absent or atrophic.  Other: Tiny umbilical hernia containing only omental fat incidentally noted. No significant volume of ascites. No pneumoperitoneum.  Musculoskeletal: Chronic compression fracture of L4 with approximately 30% loss of anterior vertebral body height is unchanged. Cavernous hemangioma and L1 vertebral body again incidentally noted. Old healed fractures of the left superior and inferior pubic rami are incidentally noted. There are no aggressive appearing lytic or blastic lesions noted in the visualized portions of the skeleton.  IMPRESSION: 1. Extensive volume loss and airspace consolidation in the lower lobes of the lungs bilaterally, and to a lesser extent in the posterior aspect of the right upper lobe. Given the patient's chronic symptoms, these findings are concerning for recurrent  aspiration with associated aspiration pneumonia, and further evaluation by speech therapy to assess for aspiration is recommended in the near future. 2. Small bilateral pleural effusions layering dependently (right greater than left). 3. No acute findings in the abdomen or pelvis. 4. Atherosclerosis, including left main and right coronary artery disease. Assessment  for potential risk factor modification, dietary therapy or pharmacologic therapy may be warranted, if clinically indicated. 5. The appearance of the liver suggests underlying cirrhosis. 6. Cardiomegaly with biatrial dilatation. 7. There are calcifications of the mitral valve and annulus. Echocardiographic correlation for evaluation of potential valvular dysfunction may be warranted if clinically indicated. 8. Additional incidental findings, as above.   Electronically Signed   By: Vinnie Langton M.D.   On: 07/24/2015 17:29   Dg Chest Portable 1 View  08/14/2015   CLINICAL DATA:  Acute onset of shortness of breath and generalized numbness. Initial encounter.  EXAM: PORTABLE CHEST - 1 VIEW  COMPARISON:  Chest radiograph performed 08/05/2015, and CT of the chest performed 811 1,016  FINDINGS: A moderate to large right-sided pleural effusion is noted, with right-sided airspace opacification. Given the appearance on prior CT, this is concerning for worsening aspiration pneumonia. The left lung appears clear. No pneumothorax is seen.  The cardiomediastinal silhouette is mildly enlarged, though not fully characterized. A right IJ line is noted ending about the distal SVC. No acute osseous abnormalities are identified.  IMPRESSION: Moderate to large right-sided pleural effusion, with right-sided airspace opacification. Given the appearance on prior CT, this is concerning for worsening aspiration pneumonia. Mild cardiomegaly noted.   Electronically Signed   By: Garald Balding M.D.   On: 08/14/2015 02:13   Dg Chest Port 1 View  08/05/2015   CLINICAL DATA:  PICC line placement.  EXAM: PORTABLE CHEST - 1 VIEW  COMPARISON:  08/04/2015.  FINDINGS: A right IJ PICC line is noted with tip overlying the superior cavoatrial junction.  Slightly increased right lower lung atelectasis/right pleural effusion noted.  Cardiomegaly and vascular congestion again noted as well as left basilar atelectasis/ airspace disease.  There is no  evidence of pneumothorax.  IMPRESSION: Right central venous catheter/PICC line with tip overlying the superior cavoatrial junction.  Slightly increased right lower lung atelectasis/right pleural effusion.  Unchanged cardiomegaly, pulmonary vascular congestion and left basilar atelectasis/ airspace disease   Electronically Signed   By: Margarette Canada M.D.   On: 08/05/2015 18:34   Dg Chest Port 1 View  08/04/2015   CLINICAL DATA:  Post right-sided thoracentesis  EXAM: PORTABLE CHEST - 1 VIEW  COMPARISON:  08/03/2015 ; 08/02/2015 ; chest CT - 07/24/2015  FINDINGS: Interval reduction in persistent small partially loculated right-sided effusion post thoracentesis. No pneumothorax. Unchanged trace left-sided effusion. Improved aeration of the right lung with persistent bibasilar heterogeneous/consolidative opacities. No new focal airspace opacities. No evidence of edema. No acute osseus abnormalities.  IMPRESSION: 1. Interval reduction in persistent small right-sided effusion post thoracentesis. No pneumothorax. 2. Unchanged trace left-sided pleural effusion. 3. Improved aeration of the right lower lung with persistent bibasilar heterogeneous/consolidative opacities, left greater than right, atelectasis versus infiltrate.   Electronically Signed   By: Sandi Mariscal M.D.   On: 08/04/2015 16:02   Dg Chest Portable 1 View  08/02/2015   CLINICAL DATA:  Cough.  Vomiting for 2 weeks.  Weakness.  EXAM: PORTABLE CHEST - 1 VIEW  COMPARISON:  07/24/2015 CT.  FINDINGS: Cardiopericardial silhouette is upper limits of normal for projection. RIGHT-greater-than-LEFT pleural effusion with bilateral RIGHT-greater-than-LEFT collapse/consolidation at  the bases. Comparing to the prior CT, there is little if any interval change. Aortic arch atherosclerosis.  IMPRESSION: Unchanged bilateral RIGHT-greater-than- LEFT pleural effusions and collapse/consolidation.   Electronically Signed   By: Dereck Ligas M.D.   On: 08/02/2015 10:13   US  Thoracentesis Asp Pleural Space W/img Guide  08/04/2015   INDICATION: Symptomatic right sided pleural effusion - please perform ultrasound-guided thoracentesis for diagnostic and therapeutic purposes.  EXAM: US THORACENTESIS ASP PLEURAL SPACE W/IMG GUIDE  COMPARISON:  Chest radiograph - 08/03/2015  MEDICATIONS: None  COMPLICATIONS: None immediate  TECHNIQUE: Informed written consent was obtained from the patient after a discussion of the risks, benefits and alternatives to treatment. A timeout was performed prior to the initiation of the procedure.  Initial ultrasound scanning demonstrates a small to moderate sized right-sided pleural effusion. The lower chest was prepped and draped in the usual sterile fashion. 1% lidocaine was used for local anesthesia.  Under direct ultrasound guidance, a 19 gauge, 7-cm, Yueh catheter was introduced. An ultrasound image was saved for documentation purposes. The thoracentesis was performed. The catheter was removed and a dressing was applied. The patient tolerated the procedure well without immediate post procedural complication. The patient was escorted to have an upright chest radiograph.  FINDINGS: A total of approximately 1 liter of serous fluid was removed. Requested samples were sent to the laboratory.  IMPRESSION: Successful ultrasound-guided right sided thoracentesis yielding 1 liter of pleural fluid.   Electronically Signed   By: Sandi Mariscal M.D.   On: 08/04/2015 16:07    Assessment:   [name] is a 81 y.o. female readmitted with recurrent cough, sob, hypoglycemia, poor po intake and hypotension.  She has CXR findings consistent with aspiration. Has already been on IV abx at facility with vanco, ceftraixone and clindamycin (she is penicillin allergic).  Her wbc on admit was 11.6 and she had hypothermia.   [place] pending. Scx not yet sent. I do not think this is failure of appropriate abx but rather recurrent aspiration.  Recommendations Check sputum  cx Consult pulm for possible bronch Consult speech and swallow for eval Change to cefepime for psuedomonal coverage, while continuing clinda for aspiration and vanco pending culture results  Thank you very much for allowing me to participate in the care of this patient. Please call with questions.   Cheral Marker. Ola Spurr, MD

## 2015-08-14 NOTE — Progress Notes (Signed)
ANTIBIOTIC CONSULT NOTE - INITIAL  Pharmacy Consult for vancomycin Indication: pneumonia  Allergies  Allergen Reactions  . Angiotensin Receptor Blockers     Other reaction(s): Other (See Comments) hyperkalemia  . Penicillins Rash    Patient Measurements: Height: 5\' 1"  (154.9 cm) Weight: 229 lb (103.874 kg) IBW/kg (Calculated) : 47.8 Adjusted Body Weight: 70.2 kg  Vital Signs: Temp: 98.2 F (36.8 C) (09/01 0107) Temp Source: Oral (09/01 0107) BP: 122/75 mmHg (09/01 0445) Pulse Rate: 79 (09/01 0445) Intake/Output from previous day:   Intake/Output from this shift:    Labs:  Recent Labs  08/11/15 1232 08/11/15 1234 08/14/15 0114  WBC 14.2*  --  11.6*  HGB 8.9*  --  8.9*  PLT 115*  --  121*  CREATININE  --  4.89* 2.50*   Estimated Creatinine Clearance: 20.2 mL/min (by C-G formula based on Cr of 2.5).  Recent Labs  08/11/15 1234  [place] 14     Microbiology: Recent Results (from the past 720 hour(s))  Blood culture (routine x 2)     Status: None   Collection Time: 08/02/15 11:59 AM  Result Value Ref Range Status   Specimen Description BLOOD LEFT ASSIST CONTROL  Final   Special Requests   Final    BOTTLES DRAWN AEROBIC AND ANAEROBIC  AER 6CC ANA [place]   Culture NO GROWTH 5 DAYS  Final   Report Status 08/07/2015 FINAL  Final  Blood culture (routine x 2)     Status: None   Collection Time: 08/02/15 12:00 PM  Result Value Ref Range Status   Specimen Description BLOOD LEFT FOREARM  Final   Special Requests BOTTLES DRAWN AEROBIC AND ANAEROBIC  1CC  Final   Culture NO GROWTH 5 DAYS  Final   Report Status 08/07/2015 FINAL  Final  Culture, expectorated sputum-assessment     Status: None   Collection Time: 08/02/15  8:37 PM  Result Value Ref Range Status   Specimen Description SPUTUM  Final   Special Requests Normal  Final   Sputum evaluation THIS SPECIMEN IS ACCEPTABLE FOR SPUTUM CULTURE  Final   Report Status 08/04/2015 FINAL  Final  MRSA PCR Screening      Status: Abnormal   Collection Time: 08/02/15  8:37 PM  Result Value Ref Range Status   MRSA by PCR POSITIVE (A) NEGATIVE Final    Comment:        The GeneXpert MRSA Assay (FDA approved for NASAL specimens only), is one component of a comprehensive MRSA colonization surveillance program. It is not intended to diagnose MRSA infection nor to guide or monitor treatment for MRSA infections. CRITICAL RESULT CALLED TO, READ BACK BY AND VERIFIED WITH: MARCEL TURNER AT 2214 08/02/15.PMH   Culture, respiratory (NON-Expectorated)     Status: None   Collection Time: 08/02/15  8:37 PM  Result Value Ref Range Status   Specimen Description SPUTUM  Final   Special Requests Normal Reflexed from [id number]  Final   Gram Stain   Final    FAIR SPECIMEN - 70-80% WBCS FEW WBC SEEN MANY GRAM POSITIVE COCCI    Culture NORMAL UPPER RESPIRATORY FLORA  Final   Report Status 08/05/2015 FINAL  Final  Body fluid culture     Status: None   Collection Time: 08/04/15  2:51 PM  Result Value Ref Range Status   Specimen Description PLEURAL  Final   Special Requests NONE  Final   Gram Stain NO WBC SEEN NO ORGANISMS SEEN   Final  Culture NO GROWTH 4 DAYS  Final   Report Status 08/08/2015 FINAL  Final    Medical History: Past Medical History  Diagnosis Date  . Skin cancer     Resected from legs  . Renal insufficiency     Patient is on dialysis and normal days are M,W and F.  . Diabetes mellitus without complication     Patient takes Insulin  . ESRD (end stage renal disease)     Monday, wednesday, Friday DIalysis  . HTN (hypertension)   . Essential tremor   . OSA (obstructive sleep apnea)   . A-fib     not on anticoagulation  . Bilateral lower extremity edema   . Osteoarthritis   . Dysrhythmia   . CHF (congestive heart failure)   . Anemia     Medications:  Infusions:  . sodium chloride 75 mL/hr at 08/14/15 0415  . dextrose 5 % and 0.45% NaCl 250 mL/hr at 08/14/15 0215  . DOPamine Stopped  (08/14/15 0547)   Assessment: 81 yof cc numbness/SOB per [facility], hypoglycemia. Discharged on 08/12/15 for CAP and was on vancomycin last dose 08/11/15 after hemodialysis, preHD trough was 14 mcg/mL. Received 1 dose in ED tonight so we will omit the traditional load and just schedule maintenance doses.   Goal of Therapy:  Vancomycin trough level 15-20 mcg/ml  Plan:  Expected duration 7 days with resolution of temperature and/or normalization of WBC. Vancomycin 1 gm IV on Monday/Wednesday/Friday before dialysis.   Laural Benes, Pharm.D. Clinical Pharmacist 08/14/2015,5:52 AM

## 2015-08-14 NOTE — Care Management Note (Signed)
Case Management Note  Patient Details  Name: Srah Ake MRN: [id number] Date of Birth: 22-Jun-1934  Subjective/Objective:  Recently discharged from [facility] (8/29) to [place] due to PNA. Readmitted for worsening PNA. CSW consult initiated.                  Action/Plan: Will follow progression  Expected Discharge Date:                  Expected Discharge Plan:  [place]  In-House Referral:  Clinical Social Work  Discharge planning Services  CM Consult  Post Acute Care Choice:    Choice offered to:     DME Arranged:    DME Agency:     HH Arranged:    [place] Agency:     Status of Service:  In process, will continue to follow  Medicare Important Message Given:    Date Medicare IM Given:    Medicare IM give by:    Date Additional Medicare IM Given:    Additional Medicare Important Message give by:     If discussed at [place] of Stay Meetings, dates discussed:    Additional Comments:  Jolly Mango, RN 08/14/2015, 9:33 AM

## 2015-08-14 NOTE — Progress Notes (Signed)
Pharmacy Consult for Ceftazidime Indication: HCAP  Allergies  Allergen Reactions  . Angiotensin Receptor Blockers     Other reaction(s): Other (See Comments) hyperkalemia  . Penicillins Rash    Patient Measurements: Height: 5\' 1"  (154.9 cm) Weight: 229 lb (103.874 kg) IBW/kg (Calculated) : 47.8  Vital Signs: Temp: 97 F (36.1 C) (09/01 1300) Temp Source: Rectal (09/01 0616) BP: 108/67 mmHg (09/01 1300) Pulse Rate: 76 (09/01 1300) Intake/Output from previous day: 08/31 0701 - 09/01 0700 In: 50 [IV Piggyback:50] Out: -  Intake/Output from this shift:    Labs:  Recent Labs  08/14/15 0114  WBC 11.6*  HGB 8.9*  PLT 121*  CREATININE 2.50*   Estimated Creatinine Clearance: 20.2 mL/min (by C-G formula based on Cr of 2.5). No results for input(s): VANCOTROUGH, VANCOPEAK, VANCORANDOM, GENTTROUGH, GENTPEAK, GENTRANDOM, TOBRATROUGH, TOBRAPEAK, TOBRARND, AMIKACINPEAK, AMIKACINTROU, AMIKACIN in the last 72 hours.   Microbiology: Recent Results (from the past 720 hour(s))  Blood culture (routine x 2)     Status: None   Collection Time: 08/02/15 11:59 AM  Result Value Ref Range Status   Specimen Description BLOOD LEFT ASSIST CONTROL  Final   Special Requests   Final    BOTTLES DRAWN AEROBIC AND ANAEROBIC  AER 6CC ANA [place]   Culture NO GROWTH 5 DAYS  Final   Report Status 08/07/2015 FINAL  Final  Blood culture (routine x 2)     Status: None   Collection Time: 08/02/15 12:00 PM  Result Value Ref Range Status   Specimen Description BLOOD LEFT FOREARM  Final   Special Requests BOTTLES DRAWN AEROBIC AND ANAEROBIC  1CC  Final   Culture NO GROWTH 5 DAYS  Final   Report Status 08/07/2015 FINAL  Final  Culture, expectorated sputum-assessment     Status: None   Collection Time: 08/02/15  8:37 PM  Result Value Ref Range Status   Specimen Description SPUTUM  Final   Special Requests Normal  Final   Sputum evaluation THIS SPECIMEN IS ACCEPTABLE FOR SPUTUM CULTURE  Final   Report  Status 08/04/2015 FINAL  Final  MRSA PCR Screening     Status: Abnormal   Collection Time: 08/02/15  8:37 PM  Result Value Ref Range Status   MRSA by PCR POSITIVE (A) NEGATIVE Final    Comment:        The GeneXpert MRSA Assay (FDA approved for NASAL specimens only), is one component of a comprehensive MRSA colonization surveillance program. It is not intended to diagnose MRSA infection nor to guide or monitor treatment for MRSA infections. CRITICAL RESULT CALLED TO, READ BACK BY AND VERIFIED WITH: [name] AT 2214 08/02/15.PMH   Culture, respiratory (NON-Expectorated)     Status: None   Collection Time: 08/02/15  8:37 PM  Result Value Ref Range Status   Specimen Description SPUTUM  Final   Special Requests Normal Reflexed from [id number]  Final   Gram Stain   Final    FAIR SPECIMEN - 70-80% WBCS FEW WBC SEEN MANY GRAM POSITIVE COCCI    Culture NORMAL UPPER RESPIRATORY FLORA  Final   Report Status 08/05/2015 FINAL  Final  Body fluid culture     Status: None   Collection Time: 08/04/15  2:51 PM  Result Value Ref Range Status   Specimen Description PLEURAL  Final   Special Requests NONE  Final   Gram Stain NO WBC SEEN NO ORGANISMS SEEN   Final   Culture NO GROWTH 4 DAYS  Final   Report Status  08/08/2015 FINAL  Final  MRSA PCR Screening     Status: Abnormal   Collection Time: 08/14/15  5:43 AM  Result Value Ref Range Status   MRSA by PCR POSITIVE (A) NEGATIVE Final    Comment:        The GeneXpert MRSA Assay (FDA approved for NASAL specimens only), is one component of a comprehensive MRSA colonization surveillance program. It is not intended to diagnose MRSA infection nor to guide or monitor treatment for MRSA infections. CRITICAL RESULT CALLED TO, READ BACK BY AND VERIFIED WITH: BYRON HADDOCK AT [place] ON 08/14/15.Marland KitchenMarland KitchenMedstar Medical Group [facility]     Medical History: Past Medical History  Diagnosis Date  . Skin cancer     Resected from legs  . Renal insufficiency     Patient is on  dialysis and normal days are M,W and F.  . Diabetes mellitus without complication     Patient takes Insulin  . ESRD (end stage renal disease)     Monday, wednesday, Friday DIalysis  . HTN (hypertension)   . Essential tremor   . OSA (obstructive sleep apnea)   . A-fib     not on anticoagulation  . Bilateral lower extremity edema   . Osteoarthritis   . Dysrhythmia   . CHF (congestive heart failure)   . Anemia     Medications:  Scheduled:  . allopurinol  50 mg Oral Daily  . amiodarone  100 mg Oral Daily  . aspirin EC  81 mg Oral Daily  . calcium acetate  1,334 mg Oral TID WC  . cefTAZidime (FORTAZ)  IV  1 g Intravenous Q24H  . Chlorhexidine Gluconate Cloth  6 each Topical Q0600  . cholecalciferol  1,000 Units Oral Daily  . cinacalcet  30 mg Oral Q breakfast  . fluticasone  2 spray Each Nare QHS  . heparin  5,000 Units Subcutaneous 3 times per day  . insulin aspart  0-9 Units Subcutaneous TID WC  . ipratropium-albuterol  3 mL Nebulization QID  . mirtazapine  7.5 mg Oral QHS  . multivitamin with minerals  1 tablet Oral Daily  . mupirocin ointment  1 application Nasal BID  . pantoprazole (PROTONIX) IV  40 mg Intravenous Q12H  . sevelamer carbonate  1,600 mg Oral TID WC  . sodium chloride  3 mL Intravenous Q12H  . tiZANidine  4 mg Oral BID  . [START ON 08/15/2015] vancomycin  1,000 mg Intravenous Q M,W,F-HD   Infusions:  . sodium chloride 999 mL/hr at 08/14/15 1136  . norepinephrine (LEVOPHED) Adult infusion 20 mcg/min (08/14/15 1137)   PRN: acetaminophen **OR** acetaminophen, benzonatate, docusate sodium, ondansetron **OR** ondansetron (ZOFRAN) IV  Assessment: 81 y/o M with ESRD on HD and PCN allergy ordered ceftazidime, clindamycin, and vancomycin for HCAP.   Plan:  Will order ceftazidime 1 g iv q 24 hours.   Ulice Dash D 08/14/2015,1:35 PM

## 2015-08-14 NOTE — ED Notes (Signed)
Pt brought by ems from [facility] were called out for numbness and shob.  Pt has fsbs of 26 per ems, d50 given fsbs 201

## 2015-08-14 NOTE — ED Notes (Signed)
Warm blankets provided per patient request.

## 2015-08-14 NOTE — Progress Notes (Signed)
Inpatient Diabetes Program Recommendations  AACE/ADA: New Consensus Statement on Inpatient Glycemic Control (2013)  Target Ranges:  Prepandial:   less than 140 mg/dL      Peak postprandial:   less than 180 mg/dL (1-2 hours)      Critically ill patients:  140 - 180 mg/dL   Results for [name], [name] (MRN [id number]) as of 08/14/2015 09:13  Ref. Range 08/14/2015 07:29  Glucose-Capillary Latest Ref Range: 65-99 mg/dL 275 (H)    Diabetes history: DM2 Outpatient Diabetes medications: 70/30 15-40 units BID with meals (depending on glucose) Current orders for Inpatient glycemic control: Novolog 0-9 units TID with meals  Inpatient Diabetes Program Recommendations Insulin - Basal: Fasting glucose is 275 mg/dl this morning. Noted in H&P that patient experienced hypoglycemia at home due to poor PO intake and continued administration of insulin. Due to poor PO intake, recommend ordering Levemir for basal insulin instead of 70/30. May want to consider starting with Levemir 5 units daily and adjust accordingly.  Thanks, Barnie Alderman, RN, MSN, CCRN, CDE Diabetes Coordinator Inpatient Diabetes Program [phone] (Team Pager from [place] to [place]) [phone] ([facility] office) [phone] [facility] office) [phone] [facility] office)

## 2015-08-14 NOTE — Progress Notes (Signed)
[facility]  ROUNDING NOTE   Subjective:  Patient well-known to [place]. She had recent admission for pneumonia. She was subsequently transferred to acute rehabilitation facility. She had hemodialysis yesterday. Yesterday evening the patient reports that she developed numbness and she also developed diaphoresis. She was found to have a blood sugar of 26. Patient had poor by mouth intake at the nursing home however was still receiving insulin. She was also found to have right-sided airspace disease consistent with aspiration pneumonia.   Objective:  Vital signs in last 24 hours:  Temp:  [92.6 F (33.7 C)-98.2 F (36.8 C)] 92.7 F (33.7 C) (09/01 0630) Pulse Rate:  [70-89] 78 (09/01 0630) Resp:  [18-33] 23 (09/01 0630) BP: (70-131)/(39-75) 120/57 mmHg (09/01 0630) SpO2:  [89 %-100 %] 89 % (09/01 0630) Weight:  [103.874 kg (229 lb)] 103.874 kg (229 lb) (09/01 0107)  Weight change:  Filed Weights   08/14/15 0107  Weight: 103.874 kg (229 lb)    Intake/Output: I/O last 3 completed shifts: In: 47 [IV Piggyback:50] Out: -    Intake/Output this shift:     Physical Exam: General: Chronically ill appearing  Head: Normocephalic, atraumatic. Moist oral mucosal membranes  Eyes: Anicteric, PERRL  Neck: Supple, trachea midline  Lungs:  Bilateral rhonchi, slightly increased work of breathing  Heart: Regular rate and rhythm  Abdomen:  Soft, nontender, BS present  Extremities:  trace peripheral edema.  Neurologic: Nonfocal, moving all four extremities  Skin: No lesions  Access: LUE AVF    Basic Metabolic Panel:  Recent Labs Lab 08/08/15 0900 08/11/15 1234 08/14/15 0114  NA 132* 124* 137  K 5.0 5.1 3.1*  CL 93* 85* 95*  CO2 29 28 33*  GLUCOSE 205* 230* 114*  BUN 19 24* 7  CREATININE 4.25* 4.89* 2.50*  CALCIUM 8.1* 8.6* 8.1*  PHOS  --  4.0  --     Liver Function Tests:  Recent Labs Lab 08/11/15 1234  ALBUMIN 2.3*   No results for input(s): LIPASE,  AMYLASE in the last 168 hours. No results for input(s): AMMONIA in the last 168 hours.  CBC:  Recent Labs Lab 08/08/15 0900 08/11/15 1232 08/14/15 0114  WBC 13.4* 14.2* 11.6*  NEUTROABS 10.0*  --   --   HGB 9.0* 8.9* 8.9*  HCT 27.4* 26.7* 27.1*  MCV 103.5* 103.2* 103.5*  PLT 148* 115* 121*    Cardiac Enzymes:  Recent Labs Lab 08/14/15 0114  TROPONINI 0.03    BNP: Invalid input(s): POCBNP  CBG:  Recent Labs Lab 08/10/15 2015 08/11/15 0718 08/11/15 1051 08/11/15 1703 08/14/15 0729  GLUCAP 260* 166* 216* 159* 275*    Microbiology: Results for orders placed or performed during the hospital encounter of 08/14/15  MRSA PCR Screening     Status: Abnormal   Collection Time: 08/14/15  5:43 AM  Result Value Ref Range Status   MRSA by PCR POSITIVE (A) NEGATIVE Final    Comment:        The GeneXpert MRSA Assay (FDA approved for NASAL specimens only), is one component of a comprehensive MRSA colonization surveillance program. It is not intended to diagnose MRSA infection nor to guide or monitor treatment for MRSA infections. CRITICAL RESULT CALLED TO, READ BACK BY AND VERIFIED WITH: BYRON HADDOCK AT [place] ON 08/14/15.Marland KitchenMarland KitchenUpmc Hamot Surgery Center     Coagulation Studies: No results for input(s): LABPROT, INR in the last 72 hours.  Urinalysis: No results for input(s): COLORURINE, LABSPEC, PHURINE, GLUCOSEU, HGBUR, BILIRUBINUR, KETONESUR, PROTEINUR, UROBILINOGEN, NITRITE, LEUKOCYTESUR in  the last 72 hours.  Invalid input(s): APPERANCEUR    Imaging: Dg Chest Portable 1 View  08/14/2015   CLINICAL DATA:  Acute onset of shortness of breath and generalized numbness. Initial encounter.  EXAM: PORTABLE CHEST - 1 VIEW  COMPARISON:  Chest radiograph performed 08/05/2015, and CT of the chest performed 811 1,016  FINDINGS: A moderate to large right-sided pleural effusion is noted, with right-sided airspace opacification. Given the appearance on prior CT, this is concerning for worsening  aspiration pneumonia. The left lung appears clear. No pneumothorax is seen.  The cardiomediastinal silhouette is mildly enlarged, though not fully characterized. A right IJ line is noted ending about the distal SVC. No acute osseous abnormalities are identified.  IMPRESSION: Moderate to large right-sided pleural effusion, with right-sided airspace opacification. Given the appearance on prior CT, this is concerning for worsening aspiration pneumonia. Mild cardiomegaly noted.   Electronically Signed   By: Garald Balding M.D.   On: 08/14/2015 02:13     Medications:   . sodium chloride 999 mL/hr at 08/14/15 1136  . norepinephrine (LEVOPHED) Adult infusion     . albumin human  25 g Intravenous STAT  . allopurinol  50 mg Oral Daily  . amiodarone  100 mg Oral Daily  . aspirin EC  81 mg Oral Daily  . calcium acetate  1,334 mg Oral TID WC  . Chlorhexidine Gluconate Cloth  6 each Topical Q0600  . cholecalciferol  1,000 Units Oral Daily  . cinacalcet  30 mg Oral Q breakfast  . fluticasone  2 spray Each Nare QHS  . heparin  5,000 Units Subcutaneous 3 times per day  . insulin aspart  0-9 Units Subcutaneous TID WC  . ipratropium-albuterol  3 mL Nebulization QID  . [START ON 08/16/2015] levofloxacin (LEVAQUIN) IV  500 mg Intravenous Q48H  . levofloxacin (LEVAQUIN) IV  750 mg Intravenous Once  . metronidazole  500 mg Intravenous Q8H  . mirtazapine  7.5 mg Oral QHS  . multivitamin with minerals  1 tablet Oral Daily  . mupirocin ointment  1 application Nasal BID  . pantoprazole (PROTONIX) IV  40 mg Intravenous Q12H  . sevelamer carbonate  1,600 mg Oral TID WC  . sodium chloride  3 mL Intravenous Q12H  . tiZANidine  4 mg Oral BID  . [START ON 08/15/2015] vancomycin  1,000 mg Intravenous Q M,W,F-HD   acetaminophen **OR** acetaminophen, benzonatate, docusate sodium, ondansetron **OR** ondansetron (ZOFRAN) IV  Assessment/ Plan:  81 y.o. female Diabetes mellitus type 2 x 20 years, ESRD ON HD, HTN  ,Psoriasis, Sleep apnea, CPAP, Recurrent vertigo, Essential tremor, Bilateral knee replacement, ACD, Secondary hyperparathyroidism, Cholcystecomy, Hysterectomy, Bilateral Tubal ligation, Carpal tunnel release, left radiocephalic AVF 01/18/70, admission for chest pain at [facility] 9/13, admission to [facility] for Afib 10/14, on chronic anticoagulation, left hip fracture, left ureteral stones, admission for pneumonia 07/2015, admission 9/16 for hypoglycemia, persistent pneumonia.  1.  ESRD on HD MWF:  Had HD yesterday,no acute indication for HD today, will plan for HD tomorrow using LUE AVF.  2.  Bacterial pneumonia/aspiration pneumonia:  Still has infiltrate noted on the right along with pleural effusion, agree with pulm/cc evaluation.  Currently on vancomycin and levaquin.   3.  Anemia of CKD:  Restart epogen 10000 units IV with HD.  4.  SHPTH:  Check phos, pth with HD tomorrow, continue renvela and phoslo for now.    5.  Hypotension:  Ok to continue IVFS for now, will decrease rate to 40cc/hr, continue  norepinephrine.     LOS: 0 Hyland Mollenkopf 9/1/201611:37 AM

## 2015-08-14 NOTE — ED Notes (Signed)
fsbs 81 md aware, ivf started per md order

## 2015-08-15 ENCOUNTER — Inpatient Hospital Stay: Payer: Medicare Other

## 2015-08-15 LAB — COMPREHENSIVE METABOLIC PANEL
ALK PHOS: 126 U/L (ref 38–126)
ALT: 13 U/L — ABNORMAL LOW (ref 14–54)
ANION GAP: 11 (ref 5–15)
AST: 25 U/L (ref 15–41)
Albumin: 2.6 g/dL — ABNORMAL LOW (ref 3.5–5.0)
BILIRUBIN TOTAL: 1.1 mg/dL (ref 0.3–1.2)
BUN: 10 mg/dL (ref 6–20)
CALCIUM: 7.9 mg/dL — AB (ref 8.9–10.3)
CO2: 28 mmol/L (ref 22–32)
Chloride: 95 mmol/L — ABNORMAL LOW (ref 101–111)
Creatinine, Ser: 3.31 mg/dL — ABNORMAL HIGH (ref 0.44–1.00)
GFR, EST AFRICAN AMERICAN: 14 mL/min — AB (ref >60)
GFR, EST NON AFRICAN AMERICAN: 12 mL/min — AB (ref >60)
Glucose, Bld: 144 mg/dL — ABNORMAL HIGH (ref 65–99)
Potassium: 3.8 mmol/L (ref 3.5–5.1)
SODIUM: 134 mmol/L — AB (ref 135–145)
TOTAL PROTEIN: 6 g/dL — AB (ref 6.5–8.1)

## 2015-08-15 LAB — GLUCOSE, CAPILLARY
GLUCOSE-CAPILLARY: 202 mg/dL — AB (ref 65–99)
GLUCOSE-CAPILLARY: 122 mg/dL — AB (ref 65–99)
GLUCOSE-CAPILLARY: 183 mg/dL — AB (ref 65–99)
Glucose-Capillary: 104 mg/dL — ABNORMAL HIGH (ref 65–99)
Glucose-Capillary: 150 mg/dL — ABNORMAL HIGH (ref 65–99)

## 2015-08-15 LAB — FOLATE RBC
Folate, Hemolysate: >620 ng/mL
Hematocrit: 28.7 % — ABNORMAL LOW (ref 34.0–46.6)

## 2015-08-15 LAB — CBC
HCT: 28.1 % — ABNORMAL LOW (ref 35.0–47.0)
HEMOGLOBIN: 9.2 g/dL — AB (ref 12.0–16.0)
MCH: 34.1 pg — AB (ref 26.0–34.0)
MCHC: 32.8 g/dL (ref 32.0–36.0)
MCV: 104.1 fL — AB (ref 80.0–100.0)
Platelets: 171 10*3/uL (ref 150–440)
RBC: 2.7 MIL/uL — ABNORMAL LOW (ref 3.80–5.20)
RDW: 15.8 % — ABNORMAL HIGH (ref 11.5–14.5)
WBC: 15 10*3/uL — ABNORMAL HIGH (ref 3.6–11.0)

## 2015-08-15 LAB — EXPECTORATED SPUTUM ASSESSMENT W GRAM STAIN, RFLX TO RESP C

## 2015-08-15 LAB — EXPECTORATED SPUTUM ASSESSMENT W REFEX TO RESP CULTURE

## 2015-08-15 LAB — VITAMIN B12: Vitamin B-12: 3031 pg/mL — ABNORMAL HIGH (ref 180–914)

## 2015-08-15 LAB — PROCALCITONIN: PROCALCITONIN: 0.43 ng/mL

## 2015-08-15 LAB — PHOSPHORUS: Phosphorus: 2.6 mg/dL (ref 2.5–4.6)

## 2015-08-15 LAB — CYTOLOGY - NON PAP

## 2015-08-15 MED ORDER — PANTOPRAZOLE SODIUM 40 MG PO TBEC
40.0000 mg | DELAYED_RELEASE_TABLET | Freq: Every day | ORAL | Status: DC
  Administered 2015-08-16 – 2015-08-20 (×5): 40 mg via ORAL
  Filled 2015-08-15 (×5): qty 1

## 2015-08-15 MED ORDER — NOREPINEPHRINE BITARTRATE 1 MG/ML IV SOLN
0.0000 ug/min | INTRAVENOUS | Status: DC
  Administered 2015-08-16: 5 ug/min via INTRAVENOUS
  Filled 2015-08-15: qty 16

## 2015-08-15 MED ORDER — SODIUM CHLORIDE 0.9 % IV SOLN: 100.0000 mL | INTRAVENOUS | Status: DC | PRN

## 2015-08-15 MED ORDER — PENTAFLUOROPROP-TETRAFLUOROETH EX AERO
1.0000 "application " | INHALATION_SPRAY | CUTANEOUS | Status: DC | PRN
  Filled 2015-08-15: qty 30

## 2015-08-15 MED ORDER — LIDOCAINE-PRILOCAINE 2.5-2.5 % EX CREA
1.0000 "application " | TOPICAL_CREAM | CUTANEOUS | Status: DC | PRN
  Filled 2015-08-15: qty 5

## 2015-08-15 MED ORDER — BUDESONIDE 0.5 MG/2ML IN SUSP
0.5000 mg | Freq: Two times a day (BID) | RESPIRATORY_TRACT | Status: DC
  Administered 2015-08-15 – 2015-08-19 (×9): 0.5 mg via RESPIRATORY_TRACT
  Filled 2015-08-15 (×11): qty 2

## 2015-08-15 MED ORDER — ALTEPLASE 2 MG IJ SOLR
2.0000 mg | Freq: Once | INTRAMUSCULAR | Status: DC | PRN
  Filled 2015-08-15: qty 2

## 2015-08-15 MED ORDER — IPRATROPIUM-ALBUTEROL 0.5-2.5 (3) MG/3ML IN SOLN
3.0000 mL | Freq: Four times a day (QID) | RESPIRATORY_TRACT | Status: DC
  Administered 2015-08-15 – 2015-08-21 (×17): 3 mL via RESPIRATORY_TRACT
  Filled 2015-08-15 (×19): qty 3

## 2015-08-15 MED ORDER — LIDOCAINE HCL (PF) 1 % IJ SOLN
5.0000 mL | INTRAMUSCULAR | Status: DC | PRN
  Filled 2015-08-15: qty 5

## 2015-08-15 MED ORDER — HEPARIN SODIUM (PORCINE) 1000 UNIT/ML DIALYSIS
1000.0000 [IU] | INTRAMUSCULAR | Status: DC | PRN
  Filled 2015-08-15: qty 1

## 2015-08-15 NOTE — Evaluation (Addendum)
Clinical/Bedside Swallow Evaluation Patient Details  Name: Cornelia Walraven MRN: [id number] Date of Birth: March 20, 1934  Today's Date: 08/15/2015 Time: SLP Start Time (ACUTE ONLY): 54 SLP Stop Time (ACUTE ONLY): 0845 SLP Time Calculation (min) (ACUTE ONLY): 60 min  Past Medical History:  Past Medical History  Diagnosis Date  . Skin cancer     Resected from legs  . Renal insufficiency     Patient is on dialysis and normal days are M,W and F.  . Diabetes mellitus without complication     Patient takes Insulin  . ESRD (end stage renal disease)     Monday, wednesday, Friday DIalysis  . HTN (hypertension)   . Essential tremor   . OSA (obstructive sleep apnea)   . A-fib     not on anticoagulation  . Bilateral lower extremity edema   . Osteoarthritis   . Dysrhythmia   . CHF (congestive heart failure)   . Anemia    Past Surgical History:  Past Surgical History  Procedure Laterality Date  . Abdominal hysterectomy    . Cholecystectomy    . Femur fracture surgery      left femur  . Tubal ligation    . Ankle fracture surgery      right ankle  . Neuroplasty / transposition median nerve at carpal tunnel bilateral    . Knee arthroplasty    . Peripheral vascular catheterization Left 08/06/2015    Procedure: A/V Shuntogram/Fistulagram;  Surgeon: Algernon Huxley, MD;  Location: [facility];  Service: Cardiovascular;  Laterality: Left;   HPI:  Pt is a 81 y.o. female with a known history of end-stage renal disease on Monday Wednesday Friday dialysis, hypertension, atrial fibrillation not on anticoagulation, insulin-dependent diabetes mellitus, arthritis, essential tremors, GERD on PPI per chart who recently discharged from [facility] following ten-day admission for community-acquired pneumonia, discharged on IV antibiotics to rehabilitation, end-stage renal disease on hemodialysis 3 times per week, last hemodialysis on Wednesday was noted to have acute shortness of  breath, generalized weakness, poor oral intake and low blood sugar of 26, hence brought to the emergency room for further evaluation. CXR revealed a moderate R pleural effusion w/ pneumonia. Patient has been seen by GI and denied any history of dysphagia although stated that when she eats "things come back up". She has never had an upper endoscopy in the past she has had a colonoscopy. She has multiple other medical illnesses and did undergo thoracentesis on Thursday for the R lung. She stated that she has been doing a lot of coughing since March 2016 but denies dysphagia or difficulty in her swallowing or painful swallowing. Concern for possible primary esophageal dysmotility, possible gastroparesis, secondary to her diabetes. Pt is currently on a regular diet per MD at admission but has been having N/V and is not eating much.    Assessment / Plan / Recommendation Clinical Impression  Pt appears to adequately tolerate mech soft trials w/ thin liquids w/ no overt s/s of aspiration noted during intake w/ SLP; clear vocal quality noted b/t trials and no change in O2 sats (98%); HR/RR at her baseline during po trials. No overt oral phase deificits noted w/ trials. Rest breaks were given during trials to allow for Esophageal clearing. Pt appears to be at reduced risk for aspiration from oropharyngeal phase deficits but continues to be at increased risk for aspiration from Reflux material; any retrograde bolus activity from the Esophagus which has been significant for pt in  terms of N/V(see GI note). Rec. a mech soft diet w/thin liquids at this time; aspiration precautions; meds in Puree for easier swallowing as nec. Rec. strict Reflux precautions. ST will f/u w/ education as Delma Post.   Pt appeared HOH during session as SLP/NSG had to repeat information intermittently when standing further away from pt.    Aspiration Risk   (reduced from an oropharyngeal standpoint)    Diet Recommendation Dysphagia 3 (Mech  soft);Thin   Medication Administration: Whole meds with puree (if nec.) Compensations: Slow rate;Small sips/bites    Other  Recommendations Recommended Consults: Consider GI evaluation (following) Oral Care Recommendations: Oral care BID;Staff/trained caregiver to provide oral care (assist)   Follow Up Recommendations       Frequency and Duration min 2x/week  1 week   Pertinent Vitals/Pain denied    SLP Swallow Goals  see care plan   Swallow Study Prior Functional Status   pt denied any difficulty chewing her foods or swallowing; she has experienced frequent N/V.     General Date of Onset: 08/14/15 Other Pertinent Information: Pt is a 81 y.o. female with a known history of end-stage renal disease on Monday Wednesday Friday dialysis, hypertension, atrial fibrillation not on anticoagulation, insulin-dependent diabetes mellitus, arthritis, essential tremors, GERD on PPI per chart who recently discharged from [facility] following ten-day admission for community-acquired pneumonia, discharged on IV antibiotics to rehabilitation, end-stage renal disease on hemodialysis 3 times per week, last hemodialysis on Wednesday was noted to have acute shortness of breath, generalized weakness, poor oral intake and low blood sugar of 26, hence brought to the emergency room for further evaluation. CXR revealed a moderate R pleural effusion w/ pneumonia. Patient has been seen by GI and denied any history of dysphagia although stated that when she eats "things come back up". She has never had an upper endoscopy in the past she has had a colonoscopy. She has multiple other medical illnesses and did undergo thoracentesis on Thursday for the R lung. She stated that she has been doing a lot of coughing since March 2016 but denies dysphagia or difficulty in her swallowing or painful swallowing. Concern for possible primary esophageal dysmotility, possible gastroparesis, secondary to her  diabetes. Pt is currently on a regular diet per MD at admission but has been having N/V and is not eating much.  Type of Study: Bedside swallow evaluation Previous Swallow Assessment: none Diet Prior to this Study: Regular;Thin liquids Temperature Spikes Noted: No (wbc 15.0 ) Respiratory Status: Supplemental O2 delivered via (comment) (4 liters) History of Recent Intubation: No Behavior/Cognition: Alert;Cooperative;Pleasant mood Oral Cavity - Dentition: Adequate natural dentition/normal for age Self-Feeding Abilities: Needs assist;Needs set up;Total assist Patient Positioning: Upright in bed Baseline Vocal Quality: Normal (at rest and talking w/ SLP) Volitional Cough: Strong Volitional Swallow: Able to elicit    Oral/Motor/Sensory Function Overall Oral Motor/Sensory Function: Appears within functional limits for tasks assessed Labial ROM: Within Functional Limits Labial Symmetry: Within Functional Limits Labial Strength: Within Functional Limits Lingual ROM: Within Functional Limits Lingual Symmetry: Within Functional Limits Lingual Strength: Within Functional Limits Facial Symmetry: Within Functional Limits Mandible: Within Functional Limits   Ice Chips Ice chips: Within functional limits Presentation: Spoon (fed; 3 trials)   Thin Liquid Thin Liquid: Within functional limits Presentation: Cup;Self Fed;Straw (assisted; 10 trials)    Nectar Thick Nectar Thick Liquid: Not tested   Honey Thick Honey Thick Liquid: Not tested   Puree Puree: Within functional limits Presentation: Spoon;Self Fed (assisted; 5 trials)  Solid   GO    Solid: Within functional limits Presentation: Self Fed (2 trials of graham cracker w/ applesauce)      Orinda Kenner, MS, CCC-SLP  Keni Wafer 08/15/2015,2:07 PM

## 2015-08-15 NOTE — Progress Notes (Signed)
POST  HD   08/15/15 1259  Neurological  Level of Consciousness Alert  Orientation Level Oriented X4  Respiratory  Respiratory Pattern Regular;Shallow;Dyspnea with exertion  Chest Assessment Chest expansion symmetrical  Bilateral Breath Sounds Expiratory wheezes;Rhonchi;Diminished  Cough None  Cardiac  Pulse Regular  Heart Sounds S1, S2  Jugular Venous Distention (JVD) No  ECG Monitor Yes  Cardiac Rhythm NSR  Vascular  Edema Generalized;Right lower extremity;Left lower extremity  Generalized Edema +1  RLE Edema +2  LLE Edema +2  Integumentary  Integumentary (WDL) X  Skin Color Appropriate for ethnicity  Skin Condition Dry;Flaky  Skin Integrity Intact  Ecchymosis Location Arm  Ecchymosis Location Orientation Bilateral  Musculoskeletal  Musculoskeletal (WDL) X  Generalized Weakness Yes  Assistive Device None  Gastrointestinal  Bowel Sounds Assessment Active  GU Assessment  Genitourinary (WDL) X  Genitourinary Symptoms Anuria;Other (Comment) (HD)  Psychosocial  Psychosocial (WDL) WDL

## 2015-08-15 NOTE — Progress Notes (Signed)
PRE HD   08/15/15 0930  Vital Signs  Temp 98.6 F (37 C)  Temp Source Core  Pulse Rate 91  Pulse Rate Source Monitor  Resp (!) 25  BP (!) 79/61 mmHg  BP Location Right Arm  BP Method Automatic  Patient Position (if appropriate) Lying  Oxygen Therapy  SpO2 100 %  O2 Device Nasal Cannula  O2 Flow Rate (L/min) 4 L/min  Pain Assessment  Pain Assessment No/denies pain  Pain Score 0  Dialysis Weight  Weight 107.6 kg (237 lb 3.4 oz)  Type of Weight Pre-Dialysis  Time-Out for Hemodialysis  What Procedure? hemodialysis  Pt Identifiers(min of two) First/Last Name;MRN/Account#  Correct Site? Yes  Correct Side? Yes  Correct Procedure? Yes  Consents Verified? Yes  Rad Studies Available? N/A  Safety Precautions Reviewed? Yes  Machine Checks  Machine Number [id number]  UF/Alarm Test Passed  Conductivity: Meter 13.8  Conductivity: Machine  13.9  pH 7.4  Reverse Osmosis [id number]  Dialyzer Lot Number [id number]  Disposable Set Lot Number [id number]  Machine Temperature 98.6 F (37 C)  Musician and Audible Yes  Blood Lines Intact and Secured Yes  Education / Care Plan  Hemodialysis Education Provided Yes  Documented Education in Clinical Pathway Yes  Fistula / Graft Left Forearm Arteriovenous fistula  No Placement Date or Time found.   Placed prior to admission: Yes  Orientation: Left  Access Location: Forearm  Access Type: Arteriovenous fistula  Site Condition No complications  Fistula / Graft Assessment Present;Thrill;Bruit  Status Accessed  Needle Size 15  Drainage Description None

## 2015-08-15 NOTE — Progress Notes (Signed)
PRE HD   08/15/15 0930  Neurological  Level of Consciousness Alert  Orientation Level Oriented X4  Respiratory  Respiratory Pattern Labored;Shallow;Tachypnea;Dyspnea with exertion;Accessory muscle use  Chest Assessment Chest expansion symmetrical  Bilateral Breath Sounds Expiratory wheezes;Rhonchi  Cough Productive;Weak  Sputum Amount Small  Sputum Color White  Sputum Consistency Thick  Cardiac  Pulse Irregular  ECG Monitor Yes  Cardiac Rhythm NSR  Vascular  Edema Generalized;Right lower extremity;Left lower extremity  Generalized Edema +1  RLE Edema +3  LLE Edema +3  Integumentary  Integumentary (WDL) X  Skin Color Appropriate for ethnicity  Skin Condition Dry;Flaky  Skin Integrity Intact  Ecchymosis Location Arm  Ecchymosis Location Orientation Bilateral  Musculoskeletal  Musculoskeletal (WDL) X  Generalized Weakness Yes  Assistive Device None  Gastrointestinal  Bowel Sounds Assessment Active  GU Assessment  Genitourinary (WDL) X  Genitourinary Symptoms Anuria;Other (Comment) (HD)  Psychosocial  Psychosocial (WDL) WDL

## 2015-08-15 NOTE — Clinical Documentation Improvement (Signed)
Internal Medicine  Can the diagnosis of obesity be further specified?   Identify Type - morbid obesity, obesity (link the BMI to condition e.g. morbid obesity with BMI of 47), overweight, with alveolar hypoventilation (Pickwickian Syndrome)  Document etiology of - drug induced (specify drug), due to excess calories, familial, endocrine  Other  Clinically Undetermined  Document any associated diagnoses/conditions. obstructive sleep apnea  Supporting Information:BMI 43   WT: 229   HT: 5'1"   Please exercise your independent, professional judgment when responding. A specific answer is not anticipated or expected.   Thank You,  Philippa Chester, RN, BSN, CDI [id number] Health Information Management [email or web address]

## 2015-08-15 NOTE — Progress Notes (Signed)
[facility] INFECTIOUS DISEASE PROGRESS NOTE Date of Admission:  08/14/2015     ID: [name] is a 81 y.o. female with recurrent aspiration pneumonia.   Principal Problem:   Healthcare-associated pneumonia Active Problems:   Acute respiratory failure   Hypoglycemia   Hypotension   ESRD on hemodialysis   DM (diabetes mellitus)   Pleural effusion on right  Subjective: Had thoracentesis done with 1200 cc removed. Breathing has improved some  ROS  Eleven systems are reviewed and negative except per hpi  Medications:  Antibiotics Given (last 72 hours)    Date/Time Action Medication Dose Rate   08/14/15 0910 Given   clindamycin (CLEOCIN) IVPB 600 mg 600 mg 100 mL/hr   08/14/15 1124 Given   levofloxacin (LEVAQUIN) IVPB 750 mg 750 mg 100 mL/hr   08/14/15 1430 Given   cefTAZidime (FORTAZ) 1 g in dextrose 5 % 50 mL IVPB 1 g 100 mL/hr   08/15/15 1149 Given   vancomycin (VANCOCIN) IVPB 1000 mg/200 mL premix 1,000 mg 200 mL/hr     . allopurinol  50 mg Oral Daily  . amiodarone  100 mg Oral Daily  . aspirin EC  81 mg Oral Daily  . budesonide (PULMICORT) nebulizer solution  0.5 mg Nebulization BID  . calcium acetate  1,334 mg Oral TID WC  . cefTAZidime (FORTAZ)  IV  1 g Intravenous Q24H  . Chlorhexidine Gluconate Cloth  6 each Topical Q0600  . cholecalciferol  1,000 Units Oral Daily  . cinacalcet  30 mg Oral Q breakfast  . fluticasone  2 spray Each Nare QHS  . heparin  5,000 Units Subcutaneous 3 times per day  . insulin aspart  0-9 Units Subcutaneous TID WC  . ipratropium-albuterol  3 mL Nebulization Q6H  . mirtazapine  7.5 mg Oral QHS  . multivitamin with minerals  1 tablet Oral Daily  . mupirocin ointment  1 application Nasal BID  . [START ON 08/16/2015] pantoprazole  40 mg Oral Q1200  . sevelamer carbonate  1,600 mg Oral TID WC  . sodium chloride  3 mL Intravenous Q12H  . tiZANidine  4 mg Oral BID  . vancomycin  1,000 mg Intravenous Q M,W,F-HD    Objective: Vital  signs in last 24 hours: Temp:  [96.6 F (35.9 C)-99.1 F (37.3 C)] 97.9 F (36.6 C) (09/02 1400) Pulse Rate:  [83-121] 91 (09/02 1400) Resp:  [14-27] 24 (09/02 1400) BP: (66-142)/(39-73) 138/61 mmHg (09/02 1400) SpO2:  [92 %-100 %] 100 % (09/02 1400) Weight:  [105.3 kg (232 lb 2.3 oz)-107.6 kg (237 lb 3.4 oz)] 107.6 kg (237 lb 3.4 oz) (09/02 0930) Constitutional: oriented to person, place, and time. Obese, chronically ill apperaing HENT: /AT, PERRLA, no scleral icterus Mouth/Throat: Oropharynx is clear and dry. No oropharyngeal exudate.  R neck TLC wnl, LUE AVF wnl Cardiovascular: Normal rate, regular rhythm and normal heart sounds.  Pulmonary/Chest: rhonchi throughout Neck = supple, no nuchal rigidity Abdominal: Soft. obese Bowel sounds are normal. exhibits no distension. There is no tenderness.  Lymphadenopathy: no cervical adenopathy. No axillary adenopathy Neurological: alert and oriented to person, place, and time.  Skin: Skin is warm and dry. No rash noted. No erythema.  EXt 2+ LE edema Psychiatric: a normal mood and affect. behavior is normal.   Lab Results  Recent Labs  08/14/15 0114 08/14/15 1519 08/15/15 0517  WBC 11.6*  --  15.0*  HGB 8.9*  --  9.2*  HCT 27.1* 28.7* 28.1*  NA 137  --  134*  K 3.1*  --  3.8  CL 95*  --  95*  CO2 33*  --  28  BUN 7  --  10  CREATININE 2.50*  --  3.31*    Microbiology: Results for orders placed or performed during the hospital encounter of 08/14/15  MRSA PCR Screening     Status: Abnormal   Collection Time: 08/14/15  5:43 AM  Result Value Ref Range Status   MRSA by PCR POSITIVE (A) NEGATIVE Final    Comment:        The GeneXpert MRSA Assay (FDA approved for NASAL specimens only), is one component of a comprehensive MRSA colonization surveillance program. It is not intended to diagnose MRSA infection nor to guide or monitor treatment for MRSA infections. CRITICAL RESULT CALLED TO, READ BACK BY AND VERIFIED  WITH: BYRON HADDOCK AT [place] ON 08/14/15.Marland KitchenMarland KitchenHosp Industrial [facility]   Body fluid culture     Status: None (Preliminary result)   Collection Time: 08/14/15 11:34 AM  Result Value Ref Range Status   Specimen Description PLEURAL  Final   Special Requests NONE  Final   Gram Stain PENDING  Incomplete   Culture NO GROWTH 1 DAY  Final   Report Status PENDING  Incomplete    Studies/Results: Dg Chest Port 1 View  08/15/2015   CLINICAL DATA:  Recurrent pneumonia. Hypotension. Status post thoracentesis 08/14/2015.  EXAM: PORTABLE CHEST - 1 VIEW  COMPARISON:  Single view of the chest 08/14/2015 and 08/05/2015.  FINDINGS: Right pleural effusion and basilar airspace disease have increased since yesterday's examination but still appear markedly improved compared to the pre-thoracentesis chest film. The left lung remains clear. There is cardiomegaly. No pneumothorax is identified. Right IJ catheter remains in place and is unchanged.  IMPRESSION: Increased right pleural effusion and basilar airspace disease since yesterday's examination.   Electronically Signed   By: Inge Rise M.D.   On: 08/15/2015 07:58   Dg Chest Port 1 View  08/14/2015   CLINICAL DATA:  81 year old female status post thoracentesis. Initial encounter.  EXAM: PORTABLE CHEST - 1 VIEW  COMPARISON:  0117 hours today and earlier.  FINDINGS: Portable AP semi upright view at 1236 hours. Significantly reduced volume of right pleural effusion. No pneumothorax. Improved right lung ventilation. Stable right IJ central line. Stable cardiac size and mediastinal contours. Left lower lobe collapse or consolidation, left lung ventilation has not significantly changed.  IMPRESSION: 1. No pneumothorax and improved right lung ventilation following thoracentesis. 2. Left lower lobe collapse or consolidation is stable to mildly progressed.   Electronically Signed   By: Genevie Ann M.D.   On: 08/14/2015 14:02   Dg Chest Portable 1 View  08/14/2015   CLINICAL DATA:  Acute onset of  shortness of breath and generalized numbness. Initial encounter.  EXAM: PORTABLE CHEST - 1 VIEW  COMPARISON:  Chest radiograph performed 08/05/2015, and CT of the chest performed 811 1,016  FINDINGS: A moderate to large right-sided pleural effusion is noted, with right-sided airspace opacification. Given the appearance on prior CT, this is concerning for worsening aspiration pneumonia. The left lung appears clear. No pneumothorax is seen.  The cardiomediastinal silhouette is mildly enlarged, though not fully characterized. A right IJ line is noted ending about the distal SVC. No acute osseous abnormalities are identified.  IMPRESSION: Moderate to large right-sided pleural effusion, with right-sided airspace opacification. Given the appearance on prior CT, this is concerning for worsening aspiration pneumonia. Mild cardiomegaly noted.   Electronically Signed   By:  Garald Balding M.D.   On: 08/14/2015 02:13   Assessment/Plan: [name] is a 81 y.o. female readmitted with recurrent cough, sob, hypoglycemia, poor po intake and hypotension. She has CXR findings consistent with aspiration. Has already been on IV abx at facility with vanco, ceftraixone and clindamycin (she is penicillin allergic). Her wbc on admit was 11.6 and she had hypothermia. [place] ngtd . Sputum cx pending. S/p thoracentesis - 1200 cc .I do not think this is failure of appropriate abx but rather recurrent aspiration.  Procalcitonin is low and decreasing.   Recommendations Check sputum cx if not already done Would tailor abx back to just cover for aspiration as well as gram negative. DC vanco. Cont clinda and ceftazidime. If sputum cx is negative would stop abx.  Speech and swallow for eval Thank you very much for the consult. Will follow with you.  [place], [place]   08/15/2015, 2:18 PM

## 2015-08-15 NOTE — Progress Notes (Signed)
HD END 

## 2015-08-15 NOTE — Progress Notes (Signed)
POST HD   08/15/15 1300  Report  Report Received From Talbot Grumbling, RN  Vital Signs  Temp 98.1 F (36.7 C)  Temp Source Core  Pulse Rate 89  Pulse Rate Source Monitor  Resp (!) 22  BP (!) 126/54 mmHg  BP Location Right Arm  BP Method Automatic  Patient Position (if appropriate) Lying  During Hemodialysis Assessment  Intra-Hemodialysis Comments HD TREATMENT END.  GOAL MET.  BLOOD RETURNED AND 15G NEEDLES REMOVED WITHOUT DIFFICULTY.  PT ALERT AND VS STABLE.    Post-Hemodialysis Assessment  Rinseback Volume (mL) 250 mL  Dialyzer Clearance Lightly streaked  Duration of HD Treatment -hour(s) 3 hour(s)  Hemodialysis Intake (mL) 500 mL  UF Total -Machine (mL) 2000 mL  Net UF (mL) 1500 mL  Tolerated HD Treatment Yes  Post-Hemodialysis Comments HD TREATMENT END.  GOAL MET.  BLOOD RETURNED AND 15G NEEDLES REMOVED WITHOUT DIFFICULTY.  PT ALERT AND VS STABLE.    AVG/AVF Arterial Site Held (minutes) 7 minutes  AVG/AVF Venous Site Held (minutes) 10 minutes  Education / Care Plan  Hemodialysis Education Provided Yes  Documented Education in Clinical Pathway Yes  Fistula / Graft Left Forearm Arteriovenous fistula  No Placement Date or Time found.   Placed prior to admission: Yes  Orientation: Left  Access Location: Forearm  Access Type: Arteriovenous fistula  Site Condition No complications  Fistula / Graft Assessment Present;Thrill;Bruit  Status Deaccessed  Needle Size 15  Drainage Description None

## 2015-08-15 NOTE — Progress Notes (Signed)
Pt with episodes of >4 sec pauses, cardiology at bedside to assess, pt asymptomatic. Strips printed and placed into pts chart. Will continue to monitor.

## 2015-08-15 NOTE — Progress Notes (Signed)
Patient ID: [name], female   DOB: 03/16/1934, 81 y.o.   MRN: [id number] [name] is a 81 y.o. female   SUBJECTIVE:  Patient recently hospitalized with bilateral pneumonia, thought due to staph aureus. Was treated with IV clindamycin, IV vancomycin and Rocephin with course complicated by rapid A. fib. Discharge chest x-ray showed substantial improvement. Within 48 hours of discharge the patient had nausea and vomiting with aspiration, hypothermia and hypotension. Patient doing better, breathing better post thoracentesis.  ______________________________________________________________________  ROS: Review of systems is unremarkable for any active cardiac,respiratory, GI, GU, hematologic, neurologic or psychiatric systems, 10 systems reviewed.  Marland Kitchen allopurinol  50 mg Oral Daily  . amiodarone  100 mg Oral Daily  . aspirin EC  81 mg Oral Daily  . calcium acetate  1,334 mg Oral TID WC  . cefTAZidime (FORTAZ)  IV  1 g Intravenous Q24H  . Chlorhexidine Gluconate Cloth  6 each Topical Q0600  . cholecalciferol  1,000 Units Oral Daily  . cinacalcet  30 mg Oral Q breakfast  . fluticasone  2 spray Each Nare QHS  . heparin  5,000 Units Subcutaneous 3 times per day  . insulin aspart  0-9 Units Subcutaneous TID WC  . ipratropium-albuterol  3 mL Nebulization QID  . mirtazapine  7.5 mg Oral QHS  . multivitamin with minerals  1 tablet Oral Daily  . mupirocin ointment  1 application Nasal BID  . pantoprazole (PROTONIX) IV  40 mg Intravenous Q12H  . sevelamer carbonate  1,600 mg Oral TID WC  . sodium chloride  3 mL Intravenous Q12H  . tiZANidine  4 mg Oral BID  . vancomycin  1,000 mg Intravenous Q M,W,F-HD   acetaminophen **OR** acetaminophen, benzonatate, docusate sodium, ondansetron **OR** ondansetron (ZOFRAN) IV   Past Medical History  Diagnosis Date  . Skin cancer     Resected from legs  . Renal insufficiency     Patient is on dialysis and normal days are M,W and F.  . Diabetes  mellitus without complication     Patient takes Insulin  . ESRD (end stage renal disease)     Monday, wednesday, Friday DIalysis  . HTN (hypertension)   . Essential tremor   . OSA (obstructive sleep apnea)   . A-fib     not on anticoagulation  . Bilateral lower extremity edema   . Osteoarthritis   . Dysrhythmia   . CHF (congestive heart failure)   . Anemia     Past Surgical History  Procedure Laterality Date  . Abdominal hysterectomy    . Cholecystectomy    . Femur fracture surgery      left femur  . Tubal ligation    . Ankle fracture surgery      right ankle  . Neuroplasty / transposition median nerve at carpal tunnel bilateral    . Knee arthroplasty    . Peripheral vascular catheterization Left 08/06/2015    Procedure: A/V Shuntogram/Fistulagram;  Surgeon: Algernon Huxley, MD;  Location: [facility];  Service: Cardiovascular;  Laterality: Left;    PHYSICAL EXAM:  BP 108/60 mmHg  Pulse 91  Temp(Src) 98.1 F (36.7 C) (Rectal)  Resp 14  Ht 5\' 1"  (1.549 m)  Wt 105.3 kg (232 lb 2.3 oz)  BMI 43.89 kg/m2  SpO2 100%  Wt Readings from Last 3 Encounters:  08/15/15 105.3 kg (232 lb 2.3 oz)  08/08/15 97.1 kg (214 lb 1.1 oz)  BP Readings from Last 3 Encounters:  08/15/15 108/60  08/11/15 140/57    Constitutional: NAD Neck: supple, no thyromegaly Respiratory: Rhonchi and wheezing throughout Cardiovascular: RRR, no murmur, no gallop Abdomen: soft, good BS, nontender Extremities: no edema Neuro: alert and oriented, no focal motor or sensory deficits  ASSESSMENT/PLAN:  Labs and imaging studies were reviewed  Complicated hospital-acquired pneumonia with sepsis syndrome-substantial right lung changes, speech evaluation ok, infectious disease and intensivist, IV ceftazidime/vancomycin/Levaquin per ID Recurrent nausea and vomiting-apprec GI thoughts, IV Protonix, abdominal CT negative, EGD when stable History A. fib-follow closely Hypotension-off IV  pressors Hypoglycemia-sliding scale insulin, no routine insulin ESRD-hemodialysis 3 times weekly Morbid obesity Transfer to floor

## 2015-08-15 NOTE — Consult Note (Signed)
PULMONARY/CCM NOTE  Date of admission: 09/01 Date of consult: 09/01 Reason for consultation: recurrent PNA, hypotension  Pt Profile:  Chronically ill 83 F with ESRD and recurrent PNAs just discharged from hospital 8/29 to NH after admitted with PNA. Discharged on IV abx with R IJ CVl in place. Readmitted in early morning 09/01 with increased dyspnea and AMS. CXR revealed large R effusion. Hypotensive. PCCM asked to assist in eval and mgmt  MAJOR EVENTS/TEST RESULTS: 9/01 admitted with above history 9/01 Thoracentesis: chemistries c/w transudate 9/02 SLP eval: no apparent oropharyngeal cause of aspiration  INDWELLING DEVICES:: R IJ CVL 8/23 >>   MICRO DATA: MRSA PCR 9/01 >> POS Pleural fluid 9/01 >>   ANTIMICROBIALS:  Vanc 9/01 >>  Ceftaz 9/01 >>   SUBJ: Feels much better. Off vasopressors. No respiratory distress. No new complaints  Filed Vitals:   08/15/15 0945 08/15/15 1000 08/15/15 1015 08/15/15 1030  BP: 66/41 88/44 107/52   Pulse: 88 86 90 90  Temp: 98.6 F (37 C) 98.6 F (37 C) 98.6 F (37 C) 98.6 F (37 C)  TempSrc:      Resp: 22 21 22 20   Height:      Weight:      SpO2:        EXAM:  Gen: no respiratory distress HEENT: NCAT, WNL Neck: R IJ CVL, JVP cannot be assessed Lungs: diffuse wheezes Cardiovascular: IRIR, rate controlled, no M noted Abdomen: markedly obese, NT, +BS Ext: cool, 1-2+ symmetric pitting pretibial edema Neuro: no focal deficits  DATA:  BMP Latest Ref Rng 08/15/2015 08/14/2015 08/11/2015  Glucose 65 - 99 mg/dL 144(H) 114(H) 230(H)  BUN 6 - 20 mg/dL 10 7 24(H)  Creatinine 0.44 - 1.00 mg/dL 3.31(H) 2.50(H) 4.89(H)  Sodium 135 - 145 mmol/L 134(L) 137 124(L)  Potassium 3.5 - 5.1 mmol/L 3.8 3.1(L) 5.1  Chloride 101 - 111 mmol/L 95(L) 95(L) 85(L)  CO2 22 - 32 mmol/L 28 33(H) 28  Calcium 8.9 - 10.3 mg/dL 7.9(L) 8.1(L) 8.6(L)    CBC Latest Ref Rng 08/15/2015 08/14/2015 08/11/2015  WBC 3.6 - 11.0 K/uL 15.0(H) 11.6(H) 14.2(H)  Hemoglobin 12.0 -  16.0 g/dL 9.2(L) 8.9(L) 8.9(L)  Hematocrit 35.0 - 47.0 % 28.1(L) 27.1(L) 26.7(L)  Platelets 150 - 440 K/uL 171 121(L) 115(L)    CXR: partial reaccumulation of R effusion     IMPRESSION:   Acute on chronic hypoxic respiratory failure Recent PNA, NOS - it is not clear that there is a current active infection. PCT low and pl eff is transudate Large R pleural effusion - concern for para-pneumonic  Thoracentesis 8/22 had chemistries c/w borderline exudate  Thoracentesis 9/01 - chemistries c/w transudate Wheezing, bronchospasm CAF, rate controlled Hypotension, resolved ESRD Hypervolemia DM 2 - episodic hypoglycemia Anemia without acute blood loss Adult failure to thrive   PLAN:  F/U pleural fluid studies Cont supplemental O2 to maintain SpO2 > 90% Continue nebulized BDs Add nebulized steroids 9/02 All abx per ID Norepinephrine to maintain MAP > 60 mmHg - might need during HD Monitor BMET intermittently Monitor I/Os Correct electrolytes as indicated Renal following - discussed my impression that pleural eff is in part due to volume overload  HD today and again 9/03 Cont SSI DVT px: SQ heparin Monitor CBC intermittently If tolerates HD today and tomorrow AM, would transfer to general med-surg floor   Merton Border, MD PCCM service Mobile [phone] Pager [phone]

## 2015-08-15 NOTE — Progress Notes (Addendum)
Pt using IS appropriately.  Lungs clear to auscultation.  States voiding without difficulty.  Left hip dsg dry and intact.

## 2015-08-15 NOTE — Progress Notes (Signed)
[facility]  ROUNDING NOTE   Subjective:  Patient due for HD today. UF target 1.5kg. In addition had right thoracentesis yesterday with good results.  Case discussed with Dr. Alva Garnet. Volume is a component of shortness of breath as well.  Therefore we decided to perform HD tomorrow as well.  Objective:  Vital signs in last 24 hours:  Temp:  [95.7 F (35.4 C)-99.1 F (37.3 C)] 98.4 F (36.9 C) (09/02 0900) Pulse Rate:  [65-121] 121 (09/02 0900) Resp:  [14-30] 22 (09/02 0900) BP: (72-142)/(27-73) 118/69 mmHg (09/02 0900) SpO2:  [92 %-100 %] 100 % (09/02 0900) Weight:  [105.3 kg (232 lb 2.3 oz)] 105.3 kg (232 lb 2.3 oz) (09/02 0636)  Weight change: 1.426 kg (3 lb 2.3 oz) Filed Weights   08/14/15 0107 08/15/15 0636  Weight: 103.874 kg (229 lb) 105.3 kg (232 lb 2.3 oz)    Intake/Output: I/O last 3 completed shifts: In: 5845.5 [I.V.:5245.5; IV Piggyback:600] Out: 0    Intake/Output this shift:     Physical Exam: General: Chronically ill appearing  Head: Normocephalic, atraumatic. Moist oral mucosal membranes  Eyes: Anicteric, PERRL  Neck: Supple, trachea midline  Lungs:  Bilateral rhonchi, normal effort  Heart: Regular rate and rhythm  Abdomen:  Soft, nontender, BS present  Extremities:  trace peripheral edema.  Neurologic: Nonfocal, moving all four extremities  Skin: No lesions  Access: LUE AVF    Basic Metabolic Panel:  Recent Labs Lab 08/11/15 1234 08/14/15 0114 08/15/15 0517  NA 124* 137 134*  K 5.1 3.1* 3.8  CL 85* 95* 95*  CO2 28 33* 28  GLUCOSE 230* 114* 144*  BUN 24* 7 10  CREATININE 4.89* 2.50* 3.31*  CALCIUM 8.6* 8.1* 7.9*  PHOS 4.0  --   --     Liver Function Tests:  Recent Labs Lab 08/11/15 1234 08/15/15 0517  AST  --  25  ALT  --  13*  ALKPHOS  --  126  BILITOT  --  1.1  PROT  --  6.0*  ALBUMIN 2.3* 2.6*   No results for input(s): LIPASE, AMYLASE in the last 168 hours. No results for input(s): AMMONIA in the last  168 hours.  CBC:  Recent Labs Lab 08/11/15 1232 08/14/15 0114 08/15/15 0517  WBC 14.2* 11.6* 15.0*  HGB 8.9* 8.9* 9.2*  HCT 26.7* 27.1* 28.1*  MCV 103.2* 103.5* 104.1*  PLT 115* 121* 171    Cardiac Enzymes:  Recent Labs Lab 08/14/15 0114  TROPONINI 0.03    BNP: Invalid input(s): POCBNP  CBG:  Recent Labs Lab 08/14/15 0729 08/14/15 1127 08/14/15 1612 08/14/15 2125 08/15/15 0724  GLUCAP 275* 159* 69 98 122*    Microbiology: Results for orders placed or performed during the hospital encounter of 08/14/15  MRSA PCR Screening     Status: Abnormal   Collection Time: 08/14/15  5:43 AM  Result Value Ref Range Status   MRSA by PCR POSITIVE (A) NEGATIVE Final    Comment:        The GeneXpert MRSA Assay (FDA approved for NASAL specimens only), is one component of a comprehensive MRSA colonization surveillance program. It is not intended to diagnose MRSA infection nor to guide or monitor treatment for MRSA infections. CRITICAL RESULT CALLED TO, READ BACK BY AND VERIFIED WITH: BYRON HADDOCK AT [place] ON 08/14/15.Marland KitchenMarland KitchenMedstar Harbor Hospital     Coagulation Studies: No results for input(s): LABPROT, INR in the last 72 hours.  Urinalysis: No results for input(s): COLORURINE, LABSPEC, [place], [place], [place],  BILIRUBINUR, KETONESUR, PROTEINUR, UROBILINOGEN, NITRITE, LEUKOCYTESUR in the last 72 hours.  Invalid input(s): APPERANCEUR    Imaging: Dg Chest Port 1 View  08/15/2015   CLINICAL DATA:  Recurrent pneumonia. Hypotension. Status post thoracentesis 08/14/2015.  EXAM: PORTABLE CHEST - 1 VIEW  COMPARISON:  Single view of the chest 08/14/2015 and 08/05/2015.  FINDINGS: Right pleural effusion and basilar airspace disease have increased since yesterday's examination but still appear markedly improved compared to the pre-thoracentesis chest film. The left lung remains clear. There is cardiomegaly. No pneumothorax is identified. Right IJ catheter remains in place and is unchanged.   IMPRESSION: Increased right pleural effusion and basilar airspace disease since yesterday's examination.   Electronically Signed   By: Inge Rise M.D.   On: 08/15/2015 07:58   Dg Chest Port 1 View  08/14/2015   CLINICAL DATA:  81 year old female status post thoracentesis. Initial encounter.  EXAM: PORTABLE CHEST - 1 VIEW  COMPARISON:  0117 hours today and earlier.  FINDINGS: Portable AP semi upright view at 1236 hours. Significantly reduced volume of right pleural effusion. No pneumothorax. Improved right lung ventilation. Stable right IJ central line. Stable cardiac size and mediastinal contours. Left lower lobe collapse or consolidation, left lung ventilation has not significantly changed.  IMPRESSION: 1. No pneumothorax and improved right lung ventilation following thoracentesis. 2. Left lower lobe collapse or consolidation is stable to mildly progressed.   Electronically Signed   By: Genevie Ann M.D.   On: 08/14/2015 14:02   Dg Chest Portable 1 View  08/14/2015   CLINICAL DATA:  Acute onset of shortness of breath and generalized numbness. Initial encounter.  EXAM: PORTABLE CHEST - 1 VIEW  COMPARISON:  Chest radiograph performed 08/05/2015, and CT of the chest performed 811 1,016  FINDINGS: A moderate to large right-sided pleural effusion is noted, with right-sided airspace opacification. Given the appearance on prior CT, this is concerning for worsening aspiration pneumonia. The left lung appears clear. No pneumothorax is seen.  The cardiomediastinal silhouette is mildly enlarged, though not fully characterized. A right IJ line is noted ending about the distal SVC. No acute osseous abnormalities are identified.  IMPRESSION: Moderate to large right-sided pleural effusion, with right-sided airspace opacification. Given the appearance on prior CT, this is concerning for worsening aspiration pneumonia. Mild cardiomegaly noted.   Electronically Signed   By: Garald Balding M.D.   On: 08/14/2015 02:13      Medications:   . norepinephrine (LEVOPHED) Adult infusion     . allopurinol  50 mg Oral Daily  . amiodarone  100 mg Oral Daily  . aspirin EC  81 mg Oral Daily  . budesonide (PULMICORT) nebulizer solution  0.5 mg Nebulization BID  . calcium acetate  1,334 mg Oral TID WC  . cefTAZidime (FORTAZ)  IV  1 g Intravenous Q24H  . Chlorhexidine Gluconate Cloth  6 each Topical Q0600  . cholecalciferol  1,000 Units Oral Daily  . cinacalcet  30 mg Oral Q breakfast  . fluticasone  2 spray Each Nare QHS  . heparin  5,000 Units Subcutaneous 3 times per day  . insulin aspart  0-9 Units Subcutaneous TID WC  . ipratropium-albuterol  3 mL Nebulization Q6H  . mirtazapine  7.5 mg Oral QHS  . multivitamin with minerals  1 tablet Oral Daily  . mupirocin ointment  1 application Nasal BID  . [START ON 08/16/2015] pantoprazole  40 mg Oral Q1200  . sevelamer carbonate  1,600 mg Oral TID WC  . sodium  chloride  3 mL Intravenous Q12H  . tiZANidine  4 mg Oral BID  . vancomycin  1,000 mg Intravenous Q M,W,F-HD   acetaminophen **OR** acetaminophen, benzonatate, docusate sodium, ondansetron **OR** ondansetron (ZOFRAN) IV  Assessment/ Plan:  81 y.o. female Diabetes mellitus type 2 x 20 years, ESRD ON HD, HTN ,Psoriasis, Sleep apnea, CPAP, Recurrent vertigo, Essential tremor, Bilateral knee replacement, ACD, Secondary hyperparathyroidism, Cholcystecomy, Hysterectomy, Bilateral Tubal ligation, Carpal tunnel release, left radiocephalic AVF 05/20/02, admission for chest pain at [facility] 9/13, admission to [facility] for Afib 10/14, on chronic anticoagulation, left hip fracture, left ureteral stones, admission for pneumonia 07/2015, admission 9/16 for hypoglycemia, persistent pneumonia.  1.  ESRD on HD MWF: Pt due for HD today, will plan for HD tomorrow as well given volume concerns.    2.  Bacterial pneumonia/aspiration pneumonia/pleural effusion:  Had thoracentesis 08/14/15. -continue broad spectrum abx, pulmonary toilet.  In  addition will perform UF today and tomorrow for additional volume removal after discussion with Dr. Alva Garnet.   3.  Anemia of CKD:  hgb 9.2, continue epogen 10000 units IV with HD.   4.  SHPTH:  Check phos today, continue renvela and phoslo.     5.  Hypotension:  Pt has PRN order for norepinephrine to use with HD.     LOS: 1 Adisynn Suleiman 9/2/20169:32 AM

## 2015-08-15 NOTE — Progress Notes (Signed)
Pt   bp down systolic 77 placed pt back on levophed dr sparks called and informed  Tamsen Snider, RN

## 2015-08-15 NOTE — Consult Note (Addendum)
Subjective: Patient seen for nausea vomiting, aspiration. Patient states she is feeling a little better today. There is less nausea and no emesis. She denies any abdominal pain.  Objective: Vital signs in last 24 hours: Temp:  [96.6 F (35.9 C)-98.8 F (37.1 C)] 98.8 F (37.1 C) (09/02 1730) Pulse Rate:  [83-121] 110 (09/02 1730) Resp:  [14-26] 18 (09/02 1730) BP: (66-142)/(39-75) 129/62 mmHg (09/02 1730) SpO2:  [92 %-100 %] 100 % (09/02 1730) Weight:  [105.3 kg (232 lb 2.3 oz)-107.6 kg (237 lb 3.4 oz)] 107.6 kg (237 lb 3.4 oz) (09/02 0930) Blood pressure 129/62, pulse 110, temperature 98.8 F (37.1 C), temperature source Core (Comment), resp. rate 18, height 5\' 1"  (1.549 m), weight 107.6 kg (237 lb 3.4 oz), SpO2 100 %.   Intake/Output from previous day: 09/01 0701 - 09/02 0700 In: 5795.5 [I.V.:5245.5; IV Piggyback:550] Out: 0   Intake/Output this shift: Total I/O In: 675.8 [P.O.:440; I.V.:35.8; IV Piggyback:200] Out: 1500 [Other:1500]   General appearance:  Elderly-appearing 81 year old female. Resp:  Coarse bilateral rhonchi Cardio:  Tachycardia GI:  Soft nontender nondistended bowel sounds positive Extremities:  2+ lower extremity edema   Lab Results: Results for orders placed or performed during the hospital encounter of 08/14/15 (from the past 24 hour(s))  Glucose, capillary     Status: None   Collection Time: 08/14/15  9:25 PM  Result Value Ref Range   Glucose-Capillary 98 65 - 99 mg/dL  Vitamin B12     Status: Abnormal   Collection Time: 08/15/15  5:17 AM  Result Value Ref Range   Vitamin B-12 3031 (H) 180 - 914 pg/mL  CBC     Status: Abnormal   Collection Time: 08/15/15  5:17 AM  Result Value Ref Range   WBC 15.0 (H) 3.6 - 11.0 K/uL   RBC 2.70 (L) 3.80 - 5.20 MIL/uL   Hemoglobin 9.2 (L) 12.0 - 16.0 g/dL   HCT 28.1 (L) 35.0 - 47.0 %   MCV 104.1 (H) 80.0 - 100.0 fL   MCH 34.1 (H) 26.0 - 34.0 pg   MCHC 32.8 32.0 - 36.0 g/dL   RDW 15.8 (H) 11.5 - 14.5 %   Platelets 171 150 - 440 K/uL  Comprehensive metabolic panel     Status: Abnormal   Collection Time: 08/15/15  5:17 AM  Result Value Ref Range   Sodium 134 (L) 135 - 145 mmol/L   Potassium 3.8 3.5 - 5.1 mmol/L   Chloride 95 (L) 101 - 111 mmol/L   CO2 28 22 - 32 mmol/L   Glucose, Bld 144 (H) 65 - 99 mg/dL   BUN 10 6 - 20 mg/dL   Creatinine, Ser 3.31 (H) 0.44 - 1.00 mg/dL   Calcium 7.9 (L) 8.9 - 10.3 mg/dL   Total Protein 6.0 (L) 6.5 - 8.1 g/dL   Albumin 2.6 (L) 3.5 - 5.0 g/dL   AST 25 15 - 41 U/L   ALT 13 (L) 14 - 54 U/L   Alkaline Phosphatase 126 38 - 126 U/L   Total Bilirubin 1.1 0.3 - 1.2 mg/dL   GFR calc non Af Amer 12 (L) >60 mL/min   GFR calc Af Amer 14 (L) >60 mL/min   Anion gap 11 5 - 15  Procalcitonin     Status: None   Collection Time: 08/15/15  5:17 AM  Result Value Ref Range   Procalcitonin 0.43 ng/mL  Glucose, capillary     Status: Abnormal   Collection Time: 08/15/15  7:24 AM  Result Value Ref Range   Glucose-Capillary 122 (H) 65 - 99 mg/dL  Phosphorus     Status: None   Collection Time: 08/15/15  9:58 AM  Result Value Ref Range   Phosphorus 2.6 2.5 - 4.6 mg/dL  Glucose, capillary     Status: Abnormal   Collection Time: 08/15/15 11:31 AM  Result Value Ref Range   Glucose-Capillary 104 (H) 65 - 99 mg/dL  Glucose, capillary     Status: Abnormal   Collection Time: 08/15/15  3:44 PM  Result Value Ref Range   Glucose-Capillary 150 (H) 65 - 99 mg/dL  Culture, sputum-assessment     Status: None   Collection Time: 08/15/15  3:57 PM  Result Value Ref Range   Specimen Description EXPECTORATED SPUTUM    Special Requests NONE    Sputum evaluation THIS SPECIMEN IS ACCEPTABLE FOR SPUTUM CULTURE    Report Status 08/15/2015 FINAL       Recent Labs  08/14/15 0114 08/14/15 1519 08/15/15 0517  WBC 11.6*  --  15.0*  HGB 8.9*  --  9.2*  HCT 27.1* 28.7* 28.1*  PLT 121*  --  171   BMET  Recent Labs  08/14/15 0114 08/15/15 0517  NA 137 134*  K 3.1* 3.8  CL 95*  95*  CO2 33* 28  GLUCOSE 114* 144*  BUN 7 10  CREATININE 2.50* 3.31*  CALCIUM 8.1* 7.9*   LFT  Recent Labs  08/15/15 0517  PROT 6.0*  ALBUMIN 2.6*  AST 25  ALT 13*  ALKPHOS 126  BILITOT 1.1   PT/INR No results for input(s): LABPROT, INR in the last 72 hours. Hepatitis Panel No results for input(s): HEPBSAG, HCVAB, HEPAIGM, HEPBIGM in the last 72 hours. C-Diff No results for input(s): CDIFFTOX in the last 72 hours. No results for input(s): CDIFFPCR in the last 72 hours.   Studies/Results: Dg Chest Port 1 View  08/15/2015   CLINICAL DATA:  Recurrent pneumonia. Hypotension. Status post thoracentesis 08/14/2015.  EXAM: PORTABLE CHEST - 1 VIEW  COMPARISON:  Single view of the chest 08/14/2015 and 08/05/2015.  FINDINGS: Right pleural effusion and basilar airspace disease have increased since yesterday's examination but still appear markedly improved compared to the pre-thoracentesis chest film. The left lung remains clear. There is cardiomegaly. No pneumothorax is identified. Right IJ catheter remains in place and is unchanged.  IMPRESSION: Increased right pleural effusion and basilar airspace disease since yesterday's examination.   Electronically Signed   By: Inge Rise M.D.   On: 08/15/2015 07:58   Dg Chest Port 1 View  08/14/2015   CLINICAL DATA:  81 year old female status post thoracentesis. Initial encounter.  EXAM: PORTABLE CHEST - 1 VIEW  COMPARISON:  0117 hours today and earlier.  FINDINGS: Portable AP semi upright view at 1236 hours. Significantly reduced volume of right pleural effusion. No pneumothorax. Improved right lung ventilation. Stable right IJ central line. Stable cardiac size and mediastinal contours. Left lower lobe collapse or consolidation, left lung ventilation has not significantly changed.  IMPRESSION: 1. No pneumothorax and improved right lung ventilation following thoracentesis. 2. Left lower lobe collapse or consolidation is stable to mildly progressed.    Electronically Signed   By: Genevie Ann M.D.   On: 08/14/2015 14:02   Dg Chest Portable 1 View  08/14/2015   CLINICAL DATA:  Acute onset of shortness of breath and generalized numbness. Initial encounter.  EXAM: PORTABLE CHEST - 1 VIEW  COMPARISON:  Chest radiograph performed 08/05/2015, and CT of the  chest performed 811 1,016  FINDINGS: A moderate to large right-sided pleural effusion is noted, with right-sided airspace opacification. Given the appearance on prior CT, this is concerning for worsening aspiration pneumonia. The left lung appears clear. No pneumothorax is seen.  The cardiomediastinal silhouette is mildly enlarged, though not fully characterized. A right IJ line is noted ending about the distal SVC. No acute osseous abnormalities are identified.  IMPRESSION: Moderate to large right-sided pleural effusion, with right-sided airspace opacification. Given the appearance on prior CT, this is concerning for worsening aspiration pneumonia. Mild cardiomegaly noted.   Electronically Signed   By: Garald Balding M.D.   On: 08/14/2015 02:13    Scheduled Inpatient Medications:   . allopurinol  50 mg Oral Daily  . amiodarone  100 mg Oral Daily  . aspirin EC  81 mg Oral Daily  . budesonide (PULMICORT) nebulizer solution  0.5 mg Nebulization BID  . calcium acetate  1,334 mg Oral TID WC  . cefTAZidime (FORTAZ)  IV  1 g Intravenous Q24H  . Chlorhexidine Gluconate Cloth  6 each Topical Q0600  . cholecalciferol  1,000 Units Oral Daily  . cinacalcet  30 mg Oral Q breakfast  . fluticasone  2 spray Each Nare QHS  . heparin  5,000 Units Subcutaneous 3 times per day  . insulin aspart  0-9 Units Subcutaneous TID WC  . ipratropium-albuterol  3 mL Nebulization Q6H  . mirtazapine  7.5 mg Oral QHS  . multivitamin with minerals  1 tablet Oral Daily  . mupirocin ointment  1 application Nasal BID  . [START ON 08/16/2015] pantoprazole  40 mg Oral Q1200  . sevelamer carbonate  1,600 mg Oral TID WC  . sodium chloride   3 mL Intravenous Q12H  . tiZANidine  4 mg Oral BID  . vancomycin  1,000 mg Intravenous Q M,W,F-HD    Continuous Inpatient Infusions:   . norepinephrine (LEVOPHED) Adult infusion Stopped (08/15/15 1400)    PRN Inpatient Medications:  sodium chloride, sodium chloride, acetaminophen **OR** acetaminophen, alteplase, benzonatate, docusate sodium, heparin, lidocaine (PF), lidocaine-prilocaine, ondansetron **OR** ondansetron (ZOFRAN) IV, pentafluoroprop-tetrafluoroeth  Miscellaneous:   Assessment:  1. Nausea vomiting likely multifactorial. Her current aspiration most likely due to esophageal dysmotility. With current pneumonia she is not a good candidate for sedated procedure, EGD. Agree with SLP recommendations currently. Continue PPI. It may be possible to reevaluate with either EGD or standard barium swallow/modified barium swallow once her respiratory status has improved.  Plan:  As noted above. I will be back on Tuesday. Doctor Candace Cruise is available over the weekend if needed.  Lollie Sails MD 08/15/2015, 6:30 PM

## 2015-08-16 ENCOUNTER — Inpatient Hospital Stay: Payer: Medicare Other

## 2015-08-16 DIAGNOSIS — Z992 Dependence on renal dialysis: Secondary | ICD-10-CM

## 2015-08-16 DIAGNOSIS — N186 End stage renal disease: Secondary | ICD-10-CM

## 2015-08-16 LAB — CBC WITH DIFFERENTIAL/PLATELET
BAND NEUTROPHILS: 0 % (ref 0–10)
BASOS PCT: 0 % (ref 0–1)
BLASTS: 0 %
Basophils Absolute: 0 10*3/uL (ref 0.0–0.1)
EOS ABS: 0.6 10*3/uL (ref 0.0–0.7)
Eosinophils Relative: 6 % — ABNORMAL HIGH (ref 0–5)
HEMATOCRIT: 24.9 % — AB (ref 35.0–47.0)
Hemoglobin: 8.3 g/dL — ABNORMAL LOW (ref 12.0–16.0)
LYMPHS PCT: 22 % (ref 12–46)
Lymphs Abs: 2 10*3/uL (ref 0.7–4.0)
MCH: 34.8 pg — AB (ref 26.0–34.0)
MCHC: 33.4 g/dL (ref 32.0–36.0)
MCV: 104.3 fL — AB (ref 80.0–100.0)
MONO ABS: 0.8 10*3/uL (ref 0.1–1.0)
MONOS PCT: 9 % (ref 3–12)
Metamyelocytes Relative: 0 %
Myelocytes: 0 %
NEUTROS ABS: 5.8 10*3/uL (ref 1.7–7.7)
NEUTROS PCT: 63 % (ref 43–77)
NRBC: 1 /100{WBCs} — AB
OTHER: 0 %
PLATELETS: 122 10*3/uL — AB (ref 150–440)
Promyelocytes Absolute: 0 %
RBC: 2.38 MIL/uL — ABNORMAL LOW (ref 3.80–5.20)
RDW: 16.1 % — ABNORMAL HIGH (ref 11.5–14.5)
WBC: 9.2 10*3/uL (ref 3.6–11.0)

## 2015-08-16 LAB — COMPREHENSIVE METABOLIC PANEL
ALT: 12 U/L — AB (ref 14–54)
AST: 22 U/L (ref 15–41)
Albumin: 2.3 g/dL — ABNORMAL LOW (ref 3.5–5.0)
Alkaline Phosphatase: 107 U/L (ref 38–126)
Anion gap: 6 (ref 5–15)
BILIRUBIN TOTAL: 0.7 mg/dL (ref 0.3–1.2)
BUN: 7 mg/dL (ref 6–20)
CALCIUM: 7.8 mg/dL — AB (ref 8.9–10.3)
CHLORIDE: 97 mmol/L — AB (ref 101–111)
CO2: 33 mmol/L — ABNORMAL HIGH (ref 22–32)
CREATININE: 2.58 mg/dL — AB (ref 0.44–1.00)
GFR, EST AFRICAN AMERICAN: 19 mL/min — AB (ref >60)
GFR, EST NON AFRICAN AMERICAN: 17 mL/min — AB (ref >60)
Glucose, Bld: 160 mg/dL — ABNORMAL HIGH (ref 65–99)
Potassium: 3.5 mmol/L (ref 3.5–5.1)
Sodium: 136 mmol/L (ref 135–145)
TOTAL PROTEIN: 5.3 g/dL — AB (ref 6.5–8.1)

## 2015-08-16 LAB — HEPATITIS B SURFACE ANTIGEN: Hepatitis B Surface Ag: NEGATIVE

## 2015-08-16 LAB — GLUCOSE, CAPILLARY
GLUCOSE-CAPILLARY: 100 mg/dL — AB (ref 65–99)
GLUCOSE-CAPILLARY: 81 mg/dL (ref 65–99)
GLUCOSE-CAPILLARY: 159 mg/dL — AB (ref 65–99)
GLUCOSE-CAPILLARY: 170 mg/dL — AB (ref 65–99)
Glucose-Capillary: 122 mg/dL — ABNORMAL HIGH (ref 65–99)
Glucose-Capillary: 135 mg/dL — ABNORMAL HIGH (ref 65–99)
Glucose-Capillary: 158 mg/dL — ABNORMAL HIGH (ref 65–99)
Glucose-Capillary: 212 mg/dL — ABNORMAL HIGH (ref 65–99)

## 2015-08-16 LAB — PARATHYROID HORMONE, INTACT (NO CA): PTH: 34 pg/mL (ref 15–65)

## 2015-08-16 LAB — PROCALCITONIN: Procalcitonin: 0.45 ng/mL

## 2015-08-16 MED ORDER — CLINDAMYCIN PHOSPHATE 300 MG/50ML IV SOLN
300.0000 mg | Freq: Three times a day (TID) | INTRAVENOUS | Status: DC
  Administered 2015-08-16 – 2015-08-21 (×14): 300 mg via INTRAVENOUS
  Filled 2015-08-16 (×20): qty 50

## 2015-08-16 NOTE — Progress Notes (Signed)
POST HD   08/16/15 1045  Report  Report Received From [facility], RN  Vital Signs  Temp 97.9 F (36.6 C)  Temp Source Core  Pulse Rate 94  Pulse Rate Source Monitor  Resp (!) 22  BP (!) 148/129 mmHg  BP Location Right Arm  BP Method Automatic  Patient Position (if appropriate) Lying  Dialysis Weight  Weight 104.9 kg (231 lb 4.2 oz)  Type of Weight Post-Dialysis  During Hemodialysis Assessment  Intra-Hemodialysis Comments HD TREATMENT END GOAL MET.  BLOOD RETURNED AND 15G NEEDLES REMOVED PER POLICY.  PT ALERT AND VS STABLE.  PRIMARY RN AT BEDSIDE.    Post-Hemodialysis Assessment  Rinseback Volume (mL) 250 mL  Dialyzer Clearance Lightly streaked  Duration of HD Treatment -hour(s) 3 hour(s)  Hemodialysis Intake (mL) 500 mL  UF Total -Machine (mL) 2000 mL  Net UF (mL) 1500 mL  Tolerated HD Treatment Yes  Post-Hemodialysis Comments HD TREATMENT END GOAL MET.  BLOOD RETURNED AND 15G NEEDLES REMOVED PER POLICY.  PT ALERT AND VS STABLE.  PRIMARY RN AT BEDSIDE.    AVG/AVF Arterial Site Held (minutes) 8 minutes  AVG/AVF Venous Site Held (minutes) 8 minutes  Education / Care Plan  Hemodialysis Education Provided Yes  Documented Education in Clinical Pathway Yes  Fistula / Graft Left Forearm Arteriovenous fistula  No Placement Date or Time found.   Placed prior to admission: Yes  Orientation: Left  Access Location: Forearm  Access Type: Arteriovenous fistula  Site Condition No complications  Fistula / Graft Assessment Present;Thrill;Bruit  Status Deaccessed  Needle Size 15  Drainage Description None

## 2015-08-16 NOTE — Progress Notes (Signed)
PRE HD   08/16/15 0715  Vital Signs  Temp 97.7 F (36.5 C)  Temp Source Core  Pulse Rate 94  Pulse Rate Source Monitor  Resp (!) 24  BP 140/82 mmHg  BP Location Right Arm  BP Method Automatic  Patient Position (if appropriate) Lying  Oxygen Therapy  SpO2 100 %  O2 Device CPAP  Pain Assessment  Pain Assessment No/denies pain  Pain Score 0  Dialysis Weight  Weight 106.1 kg (233 lb 14.5 oz)  Type of Weight Pre-Dialysis  Time-Out for Hemodialysis  What Procedure? HEMODIALYSIS  Pt Identifiers(min of two) First/Last Name;MRN/Account#  Correct Site? Yes  Correct Side? Yes  Correct Procedure? Yes  Consents Verified? Yes  Rad Studies Available? N/A  Safety Precautions Reviewed? Yes  Machine Checks  Machine Number [id number]  UF/Alarm Test Passed  Conductivity: Meter 13.9  Conductivity: Machine  14.1  pH 7.4  Reverse Osmosis SP1364  Dialyzer Lot Number [id number]  Disposable Set Lot Number [id number]  Machine Temperature 98.6 F (37 C)  Musician and Audible Yes  Blood Lines Intact and Secured Yes  Pre Treatment Patient Checks  Vascular access used during treatment Fistula  Hepatitis B Surface Antigen Results Negative  Date Hepatitis B Surface Antigen Drawn 07/21/15  Isolation Initiated Yes  Hemodialysis Consent Verified Yes  Hemodialysis Standing Orders Initiated Yes  ECG (Telemetry) Monitor On Yes  Prime Ordered Normal Saline  Length of  DialysisTreatment -hour(s) 3 Hour(s)  Dialysis Treatment Comments GOAL TO REMOVE 1.5 L OVER 3 HOURS. PT ALERT AND VS STABLE.  15G NEEDLES INSERTED WITHOUT DIFFICULTY.    Dialyzer Optiflux 180 NR  Dialysate 2.5 Ca (3K)  Dialysis Anticoagulant None  Dialysate Flow Ordered 600  Blood Flow Rate Ordered 400 mL/min  Ultrafiltration Goal 1.5 Liters  Pre Treatment Labs Renal panel;CBC  Dialysis Blood Pressure Support Ordered Normal Saline  Education / Care Plan  Hemodialysis Education Provided Yes  Documented Education in Clinical  Pathway Yes  Fistula / Graft Left Forearm Arteriovenous fistula  No Placement Date or Time found.   Placed prior to admission: Yes  Orientation: Left  Access Location: Forearm  Access Type: Arteriovenous fistula  Site Condition Bleeding;No complications  Fistula / Graft Assessment Present;Thrill;Bruit  Status Accessed  Needle Size 15  Drainage Description None

## 2015-08-16 NOTE — Progress Notes (Signed)
Pharmacy Consult for Ceftazidime Indication: HCAP  Allergies  Allergen Reactions  . Angiotensin Receptor Blockers     Other reaction(s): Other (See Comments) hyperkalemia  . Penicillins Rash    Patient Measurements: Height: 5\' 1"  (154.9 cm) Weight: 231 lb 4.2 oz (104.9 kg) IBW/kg (Calculated) : 47.8  Vital Signs: Temp: 98.1 F (36.7 C) (09/03 1200) Temp Source: Core (Comment) (09/03 1045) BP: 107/60 mmHg (09/03 1400) Pulse Rate: 84 (09/03 1400) Intake/Output from previous day: 09/02 0701 - 09/03 0700 In: 1003.6 [P.O.:540; I.V.:213.6; IV Piggyback:250] Out: 1500  Intake/Output from this shift: Total I/O In: 103 [I.V.:103] Out: 1500 [Other:1500]  Labs:  Recent Labs  08/14/15 0114 08/15/15 0517 08/16/15 0449  WBC 11.6* 15.0* 9.2  HGB 8.9* 9.2* 8.3*  PLT 121* 171 122*  CREATININE 2.50* 3.31* 2.58*   Estimated Creatinine Clearance: 19.7 mL/min (by C-G formula based on Cr of 2.58). No results for input(s): VANCOTROUGH, VANCOPEAK, VANCORANDOM, GENTTROUGH, GENTPEAK, GENTRANDOM, TOBRATROUGH, TOBRAPEAK, TOBRARND, AMIKACINPEAK, AMIKACINTROU, AMIKACIN in the last 72 hours.   Microbiology: Recent Results (from the past 720 hour(s))  Blood culture (routine x 2)     Status: None   Collection Time: 08/02/15 11:59 AM  Result Value Ref Range Status   Specimen Description BLOOD LEFT ASSIST CONTROL  Final   Special Requests   Final    BOTTLES DRAWN AEROBIC AND ANAEROBIC  AER 6CC ANA [place]   Culture NO GROWTH 5 DAYS  Final   Report Status 08/07/2015 FINAL  Final  Blood culture (routine x 2)     Status: None   Collection Time: 08/02/15 12:00 PM  Result Value Ref Range Status   Specimen Description BLOOD LEFT FOREARM  Final   Special Requests BOTTLES DRAWN AEROBIC AND ANAEROBIC  1CC  Final   Culture NO GROWTH 5 DAYS  Final   Report Status 08/07/2015 FINAL  Final  Culture, expectorated sputum-assessment     Status: None   Collection Time: 08/02/15  8:37 PM  Result Value Ref  Range Status   Specimen Description SPUTUM  Final   Special Requests Normal  Final   Sputum evaluation THIS SPECIMEN IS ACCEPTABLE FOR SPUTUM CULTURE  Final   Report Status 08/04/2015 FINAL  Final  MRSA PCR Screening     Status: Abnormal   Collection Time: 08/02/15  8:37 PM  Result Value Ref Range Status   MRSA by PCR POSITIVE (A) NEGATIVE Final    Comment:        The GeneXpert MRSA Assay (FDA approved for NASAL specimens only), is one component of a comprehensive MRSA colonization surveillance program. It is not intended to diagnose MRSA infection nor to guide or monitor treatment for MRSA infections. CRITICAL RESULT CALLED TO, READ BACK BY AND VERIFIED WITH: MARCEL TURNER AT 2214 08/02/15.PMH   Culture, respiratory (NON-Expectorated)     Status: None   Collection Time: 08/02/15  8:37 PM  Result Value Ref Range Status   Specimen Description SPUTUM  Final   Special Requests Normal Reflexed from [id number]  Final   Gram Stain   Final    FAIR SPECIMEN - 70-80% WBCS FEW WBC SEEN MANY GRAM POSITIVE COCCI    Culture NORMAL UPPER RESPIRATORY FLORA  Final   Report Status 08/05/2015 FINAL  Final  Body fluid culture     Status: None   Collection Time: 08/04/15  2:51 PM  Result Value Ref Range Status   Specimen Description PLEURAL  Final   Special Requests NONE  Final   Gram  Stain NO WBC SEEN NO ORGANISMS SEEN   Final   Culture NO GROWTH 4 DAYS  Final   Report Status 08/08/2015 FINAL  Final  MRSA PCR Screening     Status: Abnormal   Collection Time: 08/14/15  5:43 AM  Result Value Ref Range Status   MRSA by PCR POSITIVE (A) NEGATIVE Final    Comment:        The GeneXpert MRSA Assay (FDA approved for NASAL specimens only), is one component of a comprehensive MRSA colonization surveillance program. It is not intended to diagnose MRSA infection nor to guide or monitor treatment for MRSA infections. CRITICAL RESULT CALLED TO, READ BACK BY AND VERIFIED WITH: BYRON HADDOCK AT  [place] ON 08/14/15.Marland KitchenMarland KitchenProvidence Behavioral Health [facility] Campus   Body fluid culture     Status: None (Preliminary result)   Collection Time: 08/14/15 11:34 AM  Result Value Ref Range Status   Specimen Description PLEURAL  Final   Special Requests NONE  Final   Gram Stain NO WBC SEEN NO ORGANISMS SEEN   Final   Culture NO GROWTH 2 DAYS  Final   Report Status PENDING  Incomplete  Culture, sputum-assessment     Status: None   Collection Time: 08/15/15  3:57 PM  Result Value Ref Range Status   Specimen Description EXPECTORATED SPUTUM  Final   Special Requests NONE  Final   Sputum evaluation THIS SPECIMEN IS ACCEPTABLE FOR SPUTUM CULTURE  Final   Report Status 08/15/2015 FINAL  Final  Culture, respiratory (NON-Expectorated)     Status: None (Preliminary result)   Collection Time: 08/15/15  3:57 PM  Result Value Ref Range Status   Specimen Description EXPECTORATED SPUTUM  Final   Special Requests NONE Reflexed from [id number]  Final   Gram Stain PENDING  Incomplete   Culture TOO YOUNG TO READ  Final   Report Status PENDING  Incomplete     Assessment: 81 y/o M with ESRD on HD and PCN allergy ordered ceftazidime, and clindamycin (vancomycin d/c'd today per ID)  Plan:  Will continue ceftazidime 1 g iv q 24 hours.   Rexene Edison, PharmD Clinical Pharmacist  08/16/2015,2:22 PM

## 2015-08-16 NOTE — Progress Notes (Signed)
Pre hd   08/16/15 0700  Neurological  Level of Consciousness Alert  Orientation Level Oriented X4  Respiratory  Respiratory Pattern Regular  Chest Assessment Chest expansion symmetrical  Bilateral Breath Sounds Diminished  Cardiac  Pulse Regular  Heart Sounds S1, S2  ECG Monitor Yes  Cardiac Rhythm NSR  Vascular  Edema Right lower extremity;Left lower extremity  RLE Edema +3  LLE Edema +3  Integumentary  Integumentary (WDL) X  Skin Color Appropriate for ethnicity  Skin Condition Dry;Flaky  Skin Integrity Intact  Ecchymosis Location Arm  Ecchymosis Location Orientation Bilateral  Musculoskeletal  Musculoskeletal (WDL) X  Generalized Weakness Yes  Assistive Device Wheelchair  Gastrointestinal  Bowel Sounds Assessment Active  GU Assessment  Genitourinary (WDL) X  Genitourinary Symptoms Other (Comment) (HD)  Psychosocial  Psychosocial (WDL) WDL

## 2015-08-16 NOTE — Progress Notes (Signed)
Patient tolerated dialysis this am without having any low BP and no need to restart Leveophed. After dialysis pt did have one low reading, but closely monitored x 4 hours and no repeat noted, all recent MAP above 60. Dr Doy Hutching contacted and floor care orders noted

## 2015-08-16 NOTE — Plan of Care (Signed)
Problem: Phase I Progression Outcomes Goal: Pain controlled with appropriate interventions Patient denies pain Goal: Hemodynamically stable Outcome: Completed/Met Date Met:  08/16/15 Levophed was not needed today for dialysis, pt to be transferred to floor  Problem: Phase II Progression Outcomes Goal: Encourage coughing & deep breathing Outcome: Progressing Encouraged turning, coughihg and deep breathing Goal: Wean O2 if indicated Outcome: Not Progressing Patient states is on 02 all day at home and C pap at night Goal: Pain controlled Outcome: Progressing No pain noted today Goal: Progress activity as tolerated unless otherwise ordered Outcome: Not Progressing Did not get out of bed today as had dialyis all am and then was worn out this pm Goal: Discharge plan established Outcome: Progressing Floor orders noted,   Problem: Phase III Progression Outcomes Goal: Tolerating diet Outcome: Not Progressing Eating only small amounts of food

## 2015-08-16 NOTE — Progress Notes (Signed)
POST HD   08/16/15 1056  Neurological  Level of Consciousness Alert  Orientation Level Oriented X4  Respiratory  Respiratory Pattern Regular  Chest Assessment Chest expansion symmetrical  Bilateral Breath Sounds Diminished  Cough Non-productive;Congested  Cardiac  Pulse Regular  Heart Sounds S1, S2  ECG Monitor Yes  Cardiac Rhythm NSR  Vascular  Edema Right lower extremity;Left lower extremity  RLE Edema +2  LLE Edema +2  Integumentary  Integumentary (WDL) X  Skin Color Appropriate for ethnicity  Skin Condition Dry;Flaky  Skin Integrity Intact  Ecchymosis Location Arm  Ecchymosis Location Orientation Bilateral  Musculoskeletal  Musculoskeletal (WDL) X  Generalized Weakness Yes  Assistive Device Wheelchair  Gastrointestinal  Bowel Sounds Assessment Active  GU Assessment  Genitourinary (WDL) X  Genitourinary Symptoms Other (Comment) (HD)  Psychosocial  Psychosocial (WDL) WDL

## 2015-08-16 NOTE — Progress Notes (Signed)
PULMONARY/CCM NOTE  Date of admission: 09/01 Date of consult: 09/01 Reason for consultation: recurrent PNA, hypotension  Pt Profile:  Chronically ill 78 F with ESRD and recurrent PNAs just discharged from hospital 8/29 to NH after admitted with PNA. Discharged on IV abx with R IJ CVl in place. Readmitted in early morning 09/01 with increased dyspnea and AMS. CXR revealed large R effusion. Hypotensive. PCCM asked to assist in eval and mgmt. S/p right thora times 2 with recurrence, likely transudative.   MAJOR EVENTS/TEST RESULTS: 9/01 admitted with above history 9/01 Thoracentesis: chemistries c/w transudate 9/02 SLP eval: no apparent oropharyngeal cause of aspiration  INDWELLING DEVICES:: R IJ CVL 8/23 >>   MICRO DATA: MRSA PCR 9/01 >> POS Pleural fluid 9/01 >>   ANTIMICROBIALS:  Vanc 9/01 >>  Ceftaz 9/01 >>   SUBJ: Feels much better. Off vasopressors. No respiratory distress. No new complaints. She is awake and alert and is feeling well today  Filed Vitals:   08/16/15 0745 08/16/15 0800 08/16/15 0815 08/16/15 0830  BP: 164/67 165/148 145/77 138/66  Pulse: 95 93 103 111  Temp: 97.9 F (36.6 C) 97.9 F (36.6 C) 98.1 F (36.7 C) 98.1 F (36.7 C)  TempSrc:      Resp: 16 22 27 20   Height:      Weight:      SpO2:        EXAM:  Gen: no respiratory distress HEENT: NCAT, WNL Neck: R IJ CVL, JVP cannot be assessed Lungs: Lungs are clear to auscultation bilaterally with decreased air entry. Cardiovascular: IRIR, rate controlled, no M noted Abdomen: markedly obese, NT, +BS Ext: cool, 1-2+ symmetric pitting pretibial edema Neuro: no focal deficits  DATA:  BMP Latest Ref Rng 08/16/2015 08/15/2015 08/14/2015  Glucose 65 - 99 mg/dL 160(H) 144(H) 114(H)  BUN 6 - 20 mg/dL 7 10 7   Creatinine 0.44 - 1.00 mg/dL 2.58(H) 3.31(H) 2.50(H)  Sodium 135 - 145 mmol/L 136 134(L) 137  Potassium 3.5 - 5.1 mmol/L 3.5 3.8 3.1(L)  Chloride 101 - 111 mmol/L 97(L) 95(L) 95(L)  CO2 22 - 32 mmol/L  33(H) 28 33(H)  Calcium 8.9 - 10.3 mg/dL 7.8(L) 7.9(L) 8.1(L)    CBC Latest Ref Rng 08/16/2015 08/15/2015 08/14/2015  WBC 3.6 - 11.0 K/uL 9.2 15.0(H) 11.6(H)  Hemoglobin 12.0 - 16.0 g/dL 8.3(L) 9.2(L) 8.9(L)  Hematocrit 35.0 - 47.0 % 24.9(L) 28.1(L) 28.7(L)  Platelets 150 - 440 K/uL 122(L) 171 121(L)    CXR: Continued partial reaccumulation of R effusion     IMPRESSION:   Acute on chronic hypoxic respiratory failure Recent PNA, NOS - it is not clear that there is a current active infection. PCT low and pl eff is transudate Large R pleural effusion -   Thoracentesis 8/22 had chemistries c/w borderline exudate  Thoracentesis 9/01 - chemistries c/w transudate  Now re-accumulating after most recent thoracentesis, appears more consistent with transudate.  Concern that she may be having recurrent aspiration.  Wheezing, bronchospasm CAF, rate controlled Hypotension, resolved ESRD Hypervolemia DM 2 - episodic hypoglycemia Anemia without acute blood loss Adult failure to thrive  OSA.   PLAN:   Cont supplemental O2 to maintain SpO2 > 90% Continue nebulized BDs Added nebulized steroids 9/02 All abx per ID Norepinephrine to maintain MAP > 60 mmHg - might need during HD Renal following - on HD.  Cont SSI DVT px: SQ heparin Monitor CBC intermittently Okay to transfer to general medical floor from critical care standpoint.   Marda Stalker, M.D.  PCCM service

## 2015-08-16 NOTE — Progress Notes (Signed)
Report called to floor, pt stable, medicated a short time ago with Tessalon , coughing less now, resting well

## 2015-08-16 NOTE — Progress Notes (Signed)
Speech Therapy Note: Reviewed chart notes; labs and wbc. Met w/ pt and discussed her toleration of diet. Pt denied any trouble swallowing and denied any overt s/s of aspiration, however, continued to c/o N/V and only being able to swallow smoother foods to avoid gagging and nausea. Rec'd to try some other foods/consistencies if no negative feelings noted; rec. continued f/u w/ GI and possible ENT consult if pt continues to have feelings of gagging w/ increased textures.  NSG consulted and stated pt was not having any difficulty swallowing meds and liquids, purees. ST will be available for further education as nec.

## 2015-08-16 NOTE — Progress Notes (Signed)
[name] is a 81 y.o. female  Healthcare-associated pneumonia   SUBJECTIVE:  Pt in NAD. Stable on CPAP. BP stable off Levophed this AM. For HD today. Labs stable. ID consult noted.  ______________________________________________________________________  ROS: Review of systems is unremarkable for any active cardiac,respiratory, GI, GU, hematologic, neurologic or psychiatric systems, 10 systems reviewed.  @CMEDLIST @  Past Medical History  Diagnosis Date  . Skin cancer     Resected from legs  . Renal insufficiency     Patient is on dialysis and normal days are M,W and F.  . Diabetes mellitus without complication     Patient takes Insulin  . ESRD (end stage renal disease)     Monday, wednesday, Friday DIalysis  . HTN (hypertension)   . Essential tremor   . OSA (obstructive sleep apnea)   . A-fib     not on anticoagulation  . Bilateral lower extremity edema   . Osteoarthritis   . Dysrhythmia   . CHF (congestive heart failure)   . Anemia     Past Surgical History  Procedure Laterality Date  . Abdominal hysterectomy    . Cholecystectomy    . Femur fracture surgery      left femur  . Tubal ligation    . Ankle fracture surgery      right ankle  . Neuroplasty / transposition median nerve at carpal tunnel bilateral    . Knee arthroplasty    . Peripheral vascular catheterization Left 08/06/2015    Procedure: A/V Shuntogram/Fistulagram;  Surgeon: Algernon Huxley, MD;  Location: [facility];  Service: Cardiovascular;  Laterality: Left;    PHYSICAL EXAM:  BP 125/50 mmHg  Pulse 93  Temp(Src) 97.7 F (36.5 C) (Core (Comment))  Resp 18  Ht 5\' 1"  (1.549 m)  Wt 105.3 kg (232 lb 2.3 oz)  BMI 43.89 kg/m2  SpO2 100%  Wt Readings from Last 3 Encounters:  08/16/15 105.3 kg (232 lb 2.3 oz)  08/08/15 97.1 kg (214 lb 1.1 oz)            Constitutional: NAD Neck: supple, no thyromegaly Respiratory: scattered rhonchi Cardiovascular: RRR, no murmur, no gallop Abdomen:  soft, good BS, nontender Extremities: 1+ edema Neuro: alert and oriented, no focal motor or sensory deficits  ASSESSMENT/PLAN:  Labs and imaging studies were reviewed  Will change to [place] per ID. CXR today. HD today. Repeat labs in AM.

## 2015-08-16 NOTE — Progress Notes (Signed)
[facility]  ROUNDING NOTE   Subjective:  Seen and examined on hemodialysis. UF goal of 1.5 litres Breathing with BiPap.  States she is feeling better.   Objective:  Vital signs in last 24 hours:  Temp:  [97.7 F (36.5 C)-99 F (37.2 C)] 97.9 F (36.6 C) (09/03 0800) Pulse Rate:  [81-121] 93 (09/03 0800) Resp:  [16-28] 22 (09/03 0800) BP: (66-165)/(30-148) 165/148 mmHg (09/03 0800) SpO2:  [91 %-100 %] 100 % (09/03 0715) FiO2 (%):  [4 %] 4 % (09/03 0430) Weight:  [105.3 kg (232 lb 2.3 oz)-107.6 kg (237 lb 3.4 oz)] 106.1 kg (233 lb 14.5 oz) (09/03 0715)  Weight change: 2.3 kg (5 lb 1.1 oz) Filed Weights   08/15/15 0930 08/16/15 0500 08/16/15 0715  Weight: 107.6 kg (237 lb 3.4 oz) 105.3 kg (232 lb 2.3 oz) 106.1 kg (233 lb 14.5 oz)    Intake/Output: I/O last 3 completed shifts: In: 1219.8 [P.O.:540; I.V.:429.8; IV Piggyback:250] Out: 1500 [Other:1500]   Intake/Output this shift:     Physical Exam: General: Chronically ill appearing  Head: Normocephalic, atraumatic. Moist oral mucosal membranes  Eyes: Anicteric, PERRL  Neck: Supple, trachea midline  Lungs:  +BiPap, clear bilaterally  Heart: Regular rate and rhythm  Abdomen:  Soft, nontender, BS present  Extremities: Trace peripheral edema.  Neurologic: Nonfocal, moving all four extremities  Skin: No lesions  Access: LUE AVF    Basic Metabolic Panel:  Recent Labs Lab 08/11/15 1234 08/14/15 0114 08/15/15 0517 08/15/15 0958 08/16/15 0449  NA 124* 137 134*  --  136  K 5.1 3.1* 3.8  --  3.5  CL 85* 95* 95*  --  97*  CO2 28 33* 28  --  33*  GLUCOSE 230* 114* 144*  --  160*  BUN 24* 7 10  --  7  CREATININE 4.89* 2.50* 3.31*  --  2.58*  CALCIUM 8.6* 8.1* 7.9*  --  7.8*  PHOS 4.0  --   --  2.6  --     Liver Function Tests:  Recent Labs Lab 08/11/15 1234 08/15/15 0517 08/16/15 0449  AST  --  25 22  ALT  --  13* 12*  ALKPHOS  --  126 107  BILITOT  --  1.1 0.7  PROT  --  6.0* 5.3*  ALBUMIN  2.3* 2.6* 2.3*   No results for input(s): LIPASE, AMYLASE in the last 168 hours. No results for input(s): AMMONIA in the last 168 hours.  CBC:  Recent Labs Lab 08/11/15 1232 08/14/15 0114 08/14/15 1519 08/15/15 0517 08/16/15 0449  WBC 14.2* 11.6*  --  15.0* 9.2  NEUTROABS  --   --   --   --  5.8  HGB 8.9* 8.9*  --  9.2* 8.3*  HCT 26.7* 27.1* 28.7* 28.1* 24.9*  MCV 103.2* 103.5*  --  104.1* 104.3*  PLT 115* 121*  --  171 122*    Cardiac Enzymes:  Recent Labs Lab 08/14/15 0114  TROPONINI 0.03    BNP: Invalid input(s): POCBNP  CBG:  Recent Labs Lab 08/15/15 0724 08/15/15 1131 08/15/15 1544 08/15/15 2223 08/16/15 0721  GLUCAP 122* 104* 150* 183* 135*    Microbiology: Results for orders placed or performed during the hospital encounter of 08/14/15  MRSA PCR Screening     Status: Abnormal   Collection Time: 08/14/15  5:43 AM  Result Value Ref Range Status   MRSA by PCR POSITIVE (A) NEGATIVE Final    Comment:  The GeneXpert MRSA Assay (FDA approved for NASAL specimens only), is one component of a comprehensive MRSA colonization surveillance program. It is not intended to diagnose MRSA infection nor to guide or monitor treatment for MRSA infections. CRITICAL RESULT CALLED TO, READ BACK BY AND VERIFIED WITH: BYRON HADDOCK AT [facility] ON 08/14/15.Marland KitchenMarland KitchenLone Star Endoscopy [facility]   Body fluid culture     Status: None (Preliminary result)   Collection Time: 08/14/15 11:34 AM  Result Value Ref Range Status   Specimen Description PLEURAL  Final   Special Requests NONE  Final   Gram Stain PENDING  Incomplete   Culture NO GROWTH 1 DAY  Final   Report Status PENDING  Incomplete  Culture, sputum-assessment     Status: None   Collection Time: 08/15/15  3:57 PM  Result Value Ref Range Status   Specimen Description EXPECTORATED SPUTUM  Final   Special Requests NONE  Final   Sputum evaluation THIS SPECIMEN IS ACCEPTABLE FOR SPUTUM CULTURE  Final   Report Status 08/15/2015 FINAL  Final     Coagulation Studies: No results for input(s): LABPROT, INR in the last 72 hours.  Urinalysis: No results for input(s): COLORURINE, LABSPEC, PHURINE, GLUCOSEU, HGBUR, BILIRUBINUR, KETONESUR, PROTEINUR, UROBILINOGEN, NITRITE, LEUKOCYTESUR in the last 72 hours.  Invalid input(s): APPERANCEUR    Imaging: Dg Chest Port 1 View  08/16/2015   CLINICAL DATA:  ICU patient respiratory failure  EXAM: PORTABLE CHEST - 1 VIEW  COMPARISON:  Radiograph 08/15/2015  FINDINGS: RIGHT central venous line with tip in the distal SVC. Normal cardiac silhouette. There is RIGHT basilar atelectasis with small effusion not changed from prior. LEFT lung is clear.  IMPRESSION: No interval change. Persistent RIGHT basilar atelectasis and effusion.   Electronically Signed   By: Suzy Bouchard M.D.   On: 08/16/2015 07:55   Dg Chest Port 1 View  08/15/2015   CLINICAL DATA:  Recurrent pneumonia. Hypotension. Status post thoracentesis 08/14/2015.  EXAM: PORTABLE CHEST - 1 VIEW  COMPARISON:  Single view of the chest 08/14/2015 and 08/05/2015.  FINDINGS: Right pleural effusion and basilar airspace disease have increased since yesterday's examination but still appear markedly improved compared to the pre-thoracentesis chest film. The left lung remains clear. There is cardiomegaly. No pneumothorax is identified. Right IJ catheter remains in place and is unchanged.  IMPRESSION: Increased right pleural effusion and basilar airspace disease since yesterday's examination.   Electronically Signed   By: Inge Rise M.D.   On: 08/15/2015 07:58   Dg Chest Port 1 View  08/14/2015   CLINICAL DATA:  81 year old female status post thoracentesis. Initial encounter.  EXAM: PORTABLE CHEST - 1 VIEW  COMPARISON:  0117 hours today and earlier.  FINDINGS: Portable AP semi upright view at 1236 hours. Significantly reduced volume of right pleural effusion. No pneumothorax. Improved right lung ventilation. Stable right IJ central line. Stable cardiac  size and mediastinal contours. Left lower lobe collapse or consolidation, left lung ventilation has not significantly changed.  IMPRESSION: 1. No pneumothorax and improved right lung ventilation following thoracentesis. 2. Left lower lobe collapse or consolidation is stable to mildly progressed.   Electronically Signed   By: Genevie Ann M.D.   On: 08/14/2015 14:02     Medications:   . norepinephrine (LEVOPHED) Adult infusion Stopped (08/16/15 0600)   . allopurinol  50 mg Oral Daily  . amiodarone  100 mg Oral Daily  . aspirin EC  81 mg Oral Daily  . budesonide (PULMICORT) nebulizer solution  0.5 mg Nebulization BID  .  calcium acetate  1,334 mg Oral TID WC  . cefTAZidime (FORTAZ)  IV  1 g Intravenous Q24H  . Chlorhexidine Gluconate Cloth  6 each Topical Q0600  . cholecalciferol  1,000 Units Oral Daily  . cinacalcet  30 mg Oral Q breakfast  . clindamycin (CLEOCIN) IV  300 mg Intravenous 3 times per day  . fluticasone  2 spray Each Nare QHS  . heparin  5,000 Units Subcutaneous 3 times per day  . insulin aspart  0-9 Units Subcutaneous TID WC  . ipratropium-albuterol  3 mL Nebulization Q6H  . mirtazapine  7.5 mg Oral QHS  . multivitamin with minerals  1 tablet Oral Daily  . mupirocin ointment  1 application Nasal BID  . pantoprazole  40 mg Oral Q1200  . sevelamer carbonate  1,600 mg Oral TID WC  . sodium chloride  3 mL Intravenous Q12H  . tiZANidine  4 mg Oral BID   sodium chloride, sodium chloride, acetaminophen **OR** acetaminophen, alteplase, benzonatate, docusate sodium, heparin, lidocaine (PF), lidocaine-prilocaine, ondansetron **OR** ondansetron (ZOFRAN) IV, pentafluoroprop-tetrafluoroeth  Assessment/ Plan:  81 y.o. female Diabetes mellitus type 2 x 20 years, ESRD ON HD, HTN ,Psoriasis, Sleep apnea, CPAP, Recurrent vertigo, Essential tremor, Bilateral knee replacement, ACD, Secondary hyperparathyroidism, Cholcystecomy, Hysterectomy, Bilateral Tubal ligation, Carpal tunnel release,  admitted on 08/14/2015 for Hypoglycemia [E16.2] Aspiration pneumonia, unspecified aspiration pneumonia type [J69.0] Hypotension, unspecified hypotension type [I95.9]  1.  ESRD on HD MWF: extra hemodialysis treatment today to assist with respiratory status. Dialysis yesterday tolerated well with UF of 1.5 liters.  - Continue MWF schedule - Evaluate daily for dialysis need  2.  Bacterial pneumonia/aspiration pneumonia/pleural effusion:  Had thoracentesis 08/14/15. Wbc has improved  - ID input: clindamycin and ceftazidime  3.  Anemia of CKD:  hgb 8.3,  continue epogen 10000 units IV with HD.   4.  SHPTH:  Phos 2.6 - on calcium acetate and sevelamer - hold cinacalcet - PTH 43 on 8/22  5.  Hypotension:  Pt has PRN order for norepinephrine to use with HD.  -  May need to restart midodine.    LOS: [place], [place] 9/3/20168:34 AM

## 2015-08-17 ENCOUNTER — Inpatient Hospital Stay: Payer: Medicare Other

## 2015-08-17 LAB — COMPREHENSIVE METABOLIC PANEL
ALK PHOS: 118 U/L (ref 38–126)
ALT: 10 U/L — ABNORMAL LOW (ref 14–54)
ANION GAP: 8 (ref 5–15)
AST: 19 U/L (ref 15–41)
Albumin: 2.2 g/dL — ABNORMAL LOW (ref 3.5–5.0)
BUN: 6 mg/dL (ref 6–20)
CALCIUM: 8 mg/dL — AB (ref 8.9–10.3)
CO2: 31 mmol/L (ref 22–32)
Chloride: 97 mmol/L — ABNORMAL LOW (ref 101–111)
Creatinine, Ser: 2.27 mg/dL — ABNORMAL HIGH (ref 0.44–1.00)
GFR calc non Af Amer: 19 mL/min — ABNORMAL LOW (ref >60)
GFR, EST AFRICAN AMERICAN: 22 mL/min — AB (ref >60)
Glucose, Bld: 129 mg/dL — ABNORMAL HIGH (ref 65–99)
POTASSIUM: 3.5 mmol/L (ref 3.5–5.1)
SODIUM: 136 mmol/L (ref 135–145)
TOTAL PROTEIN: 5.4 g/dL — AB (ref 6.5–8.1)
Total Bilirubin: 0.6 mg/dL (ref 0.3–1.2)

## 2015-08-17 LAB — CBC WITH DIFFERENTIAL/PLATELET
Basophils Absolute: 0.1 10*3/uL (ref 0–0.1)
Basophils Relative: 1 %
EOS ABS: 0.2 10*3/uL (ref 0–0.7)
EOS PCT: 3 %
HCT: 24.8 % — ABNORMAL LOW (ref 35.0–47.0)
HEMOGLOBIN: 8.1 g/dL — AB (ref 12.0–16.0)
LYMPHS ABS: 1.5 10*3/uL (ref 1.0–3.6)
Lymphocytes Relative: 20 %
MCH: 33.8 pg (ref 26.0–34.0)
MCHC: 32.7 g/dL (ref 32.0–36.0)
MCV: 103.1 fL — ABNORMAL HIGH (ref 80.0–100.0)
MONO ABS: 1 10*3/uL — AB (ref 0.2–0.9)
MONOS PCT: 14 %
Neutro Abs: 4.8 10*3/uL (ref 1.4–6.5)
Neutrophils Relative %: 62 %
PLATELETS: 117 10*3/uL — AB (ref 150–440)
RBC: 2.4 MIL/uL — ABNORMAL LOW (ref 3.80–5.20)
RDW: 15.9 % — AB (ref 11.5–14.5)
WBC: 7.6 10*3/uL (ref 3.6–11.0)

## 2015-08-17 LAB — CBC
HCT: 25.8 % — ABNORMAL LOW (ref 35.0–47.0)
Hemoglobin: 8.5 g/dL — ABNORMAL LOW (ref 12.0–16.0)
MCH: 34.1 pg — AB (ref 26.0–34.0)
MCHC: 33 g/dL (ref 32.0–36.0)
MCV: 103.3 fL — ABNORMAL HIGH (ref 80.0–100.0)
PLATELETS: 127 10*3/uL — AB (ref 150–440)
RBC: 2.5 MIL/uL — AB (ref 3.80–5.20)
RDW: 15.9 % — ABNORMAL HIGH (ref 11.5–14.5)
WBC: 8.5 10*3/uL (ref 3.6–11.0)

## 2015-08-17 LAB — GLUCOSE, CAPILLARY
GLUCOSE-CAPILLARY: 194 mg/dL — AB (ref 65–99)
Glucose-Capillary: 139 mg/dL — ABNORMAL HIGH (ref 65–99)
Glucose-Capillary: 158 mg/dL — ABNORMAL HIGH (ref 65–99)

## 2015-08-17 MED ORDER — MIDODRINE HCL 5 MG PO TABS
5.0000 mg | ORAL_TABLET | Freq: Once | ORAL | Status: AC
  Administered 2015-08-17: 5 mg via ORAL
  Filled 2015-08-17: qty 1

## 2015-08-17 MED ORDER — ZOLPIDEM TARTRATE 5 MG PO TABS
5.0000 mg | ORAL_TABLET | Freq: Every evening | ORAL | Status: DC | PRN
  Administered 2015-08-17 – 2015-08-20 (×4): 5 mg via ORAL
  Filled 2015-08-17 (×4): qty 1

## 2015-08-17 NOTE — Progress Notes (Signed)
PULMONARY/CCM NOTE  Date of admission: 09/01 Date of consult: 09/01 Reason for consultation: recurrent PNA, hypotension  Pt Profile:  Chronically ill 19 F with ESRD and recurrent PNAs just discharged from hospital 8/29 to NH after admitted with PNA. Discharged on IV abx with R IJ CVl in place. Readmitted in early morning 09/01 with increased dyspnea and AMS. CXR revealed large R effusion. Hypotensive. PCCM asked to assist in eval and mgmt. S/p right thora times 2 with recurrence, likely transudative.   MAJOR EVENTS/TEST RESULTS: 9/01 admitted with above history 9/01 Thoracentesis: chemistries c/w transudate 9/02 SLP eval: no apparent oropharyngeal cause of aspiration  INDWELLING DEVICES:: R IJ CVL 8/23 >>   MICRO DATA: MRSA PCR 9/01 >> POS Pleural fluid 9/01 >>   ANTIMICROBIALS:  Vanc 9/01 >>  Ceftaz 9/01 >>   SUBJ: Feels much better. Off vasopressors. No respiratory distress. No new complaints. She is awake and alert and is feeling well today  Filed Vitals:   08/16/15 1936 08/16/15 2111 08/17/15 0448 08/17/15 0759  BP: 152/73  127/57   Pulse: 95  93   Temp: 97.5 F (36.4 C)  97.9 F (36.6 C)   TempSrc: Oral  Oral   Resp: 20  18   Height:      Weight:  104.055 kg (229 lb 6.4 oz) 104.237 kg (229 lb 12.8 oz)   SpO2: 100% 100% 100% 94%    EXAM:  Gen: no respiratory distress HEENT: NCAT, WNL Neck: R IJ CVL, JVP cannot be assessed Lungs: Lungs are clear to auscultation bilaterally with decreased air entry. Cardiovascular: IRIR, rate controlled, no M noted Abdomen: markedly obese, NT, +BS Ext: cool, 1-2+ symmetric pitting pretibial edema Neuro: no focal deficits  DATA:  BMP Latest Ref Rng 08/17/2015 08/16/2015 08/15/2015  Glucose 65 - 99 mg/dL 129(H) 160(H) 144(H)  BUN 6 - 20 mg/dL 6 7 10   Creatinine 0.44 - 1.00 mg/dL 2.27(H) 2.58(H) 3.31(H)  Sodium 135 - 145 mmol/L 136 136 134(L)  Potassium 3.5 - 5.1 mmol/L 3.5 3.5 3.8  Chloride 101 - 111 mmol/L 97(L) 97(L) 95(L)  CO2  22 - 32 mmol/L 31 33(H) 28  Calcium 8.9 - 10.3 mg/dL 8.0(L) 7.8(L) 7.9(L)    CBC Latest Ref Rng 08/17/2015 08/16/2015 08/15/2015  WBC 3.6 - 11.0 K/uL 7.6 9.2 15.0(H)  Hemoglobin 12.0 - 16.0 g/dL 8.1(L) 8.3(L) 9.2(L)  Hematocrit 35.0 - 47.0 % 24.8(L) 24.9(L) 28.1(L)  Platelets 150 - 440 K/uL 117(L) 122(L) 171    CXR: Continued partial reaccumulation of R effusion     IMPRESSION:   Acute on chronic hypoxic respiratory failure Recent PNA, NOS - it is not clear that there is a current active infection. PCT low and pl eff is transudate Large R pleural effusion -   Thoracentesis 8/22 had chemistries c/w borderline exudate  Thoracentesis 9/01 - chemistries c/w transudate  Now re-accumulating after most recent thoracentesis, appears more consistent with transudate.  Concern that she may be having recurrent aspiration.  Wheezing, bronchospasm CAF, rate controlled Hypotension, resolved ESRD Hypervolemia DM 2 - episodic hypoglycemia Anemia without acute blood loss Adult failure to thrive  OSA.   PLAN:   Cont supplemental O2 to maintain SpO2 > 90% Continue nebulized BDs Added nebulized steroids 9/02 All abx per ID Renal following - on HD. To continue for fluid removal. Cont SSI DVT px: SQ heparin     Deep Ashby Dawes, M.D.  PCCM service

## 2015-08-17 NOTE — Progress Notes (Signed)
HD END 

## 2015-08-17 NOTE — Progress Notes (Signed)
[facility]  ROUNDING NOTE   Subjective:   Hemodialysis yesterday  [name] of 1.5 litres. However today with increasing peripheral edema and requiring 2 Litres [name] at bedside.   Objective:  Vital signs in last 24 hours:  Temp:  [97.5 F (36.4 C)-98.1 F (36.7 C)] 97.9 F (36.6 C) (09/04 0448) Pulse Rate:  [77-108] 93 (09/04 0448) Resp:  [18-26] 18 (09/04 0448) BP: (82-152)/(46-129) 127/57 mmHg (09/04 0448) SpO2:  [94 %-100 %] 94 % (09/04 0759) Weight:  [104.055 kg (229 lb 6.4 oz)-104.9 kg (231 lb 4.2 oz)] 104.237 kg (229 lb 12.8 oz) (09/04 0448)  Weight change: -1.5 kg (-3 lb 4.9 oz) Filed Weights   08/16/15 1045 08/16/15 2111 08/17/15 0448  Weight: 104.9 kg (231 lb 4.2 oz) 104.055 kg (229 lb 6.4 oz) 104.237 kg (229 lb 12.8 oz)    Intake/Output: I/O last 3 completed shifts: In: 510.8 [P.O.:100; I.V.:360.8; IV Piggyback:50] Out: 1500 [Other:1500]   Intake/Output this shift:     Physical Exam: General: Chronically ill appearing  Head: Normocephalic, atraumatic. Moist oral mucosal membranes  Eyes: Anicteric, PERRL  Neck: Supple, trachea midline  Lungs:  +BiPap, clear bilaterally  Heart: Regular rate and rhythm  Abdomen:  Soft, nontender, BS present  Extremities: 2+ peripheral edema.  Neurologic: Nonfocal, moving all four extremities  Skin: No lesions  Access: LUE AVF    Basic Metabolic Panel:  Recent Labs Lab 08/11/15 1234 08/14/15 0114 08/15/15 0517 08/15/15 0958 08/16/15 0449 08/17/15 0530  NA 124* 137 134*  --  136 136  K 5.1 3.1* 3.8  --  3.5 3.5  CL 85* 95* 95*  --  97* 97*  CO2 28 33* 28  --  33* 31  GLUCOSE 230* 114* 144*  --  160* 129*  BUN 24* 7 10  --  7 6  CREATININE 4.89* 2.50* 3.31*  --  2.58* 2.27*  CALCIUM 8.6* 8.1* 7.9*  --  7.8* 8.0*  PHOS 4.0  --   --  2.6  --   --     Liver Function Tests:  Recent Labs Lab 08/11/15 1234 08/15/15 0517 08/16/15 0449 08/17/15 0530  AST  --  25 22 19   ALT  --  13* 12* 10*   ALKPHOS  --  126 107 118  BILITOT  --  1.1 0.7 0.6  PROT  --  6.0* 5.3* 5.4*  ALBUMIN 2.3* 2.6* 2.3* 2.2*   No results for input(s): LIPASE, AMYLASE in the last 168 hours. No results for input(s): AMMONIA in the last 168 hours.  CBC:  Recent Labs Lab 08/11/15 1232 08/14/15 0114 08/14/15 1519 08/15/15 0517 08/16/15 0449 08/17/15 0530  WBC 14.2* 11.6*  --  15.0* 9.2 7.6  NEUTROABS  --   --   --   --  5.8 4.8  HGB 8.9* 8.9*  --  9.2* 8.3* 8.1*  HCT 26.7* 27.1* 28.7* 28.1* 24.9* 24.8*  MCV 103.2* 103.5*  --  104.1* 104.3* 103.1*  PLT 115* 121*  --  171 122* 117*    Cardiac Enzymes:  Recent Labs Lab 08/14/15 0114  TROPONINI 0.03    BNP: Invalid input(s): POCBNP  CBG:  Recent Labs Lab 08/16/15 1116 08/16/15 1151 08/16/15 1555 08/16/15 1939 08/17/15 0733  GLUCAP 159* 158* 212* 170* 78*    Microbiology: Results for orders placed or performed during the hospital encounter of 08/14/15  MRSA PCR Screening     Status: Abnormal   Collection Time: 08/14/15  5:43  AM  Result Value Ref Range Status   MRSA by PCR POSITIVE (A) NEGATIVE Final    Comment:        The GeneXpert MRSA Assay (FDA approved for NASAL specimens only), is one component of a comprehensive MRSA colonization surveillance program. It is not intended to diagnose MRSA infection nor to guide or monitor treatment for MRSA infections. CRITICAL RESULT CALLED TO, READ BACK BY AND VERIFIED WITH: BYRON HADDOCK AT [facility] ON 08/14/15.Marland KitchenMarland KitchenMahoning Valley Ambulatory Surgery Center Inc   Body fluid culture     Status: None (Preliminary result)   Collection Time: 08/14/15 11:34 AM  Result Value Ref Range Status   Specimen Description PLEURAL  Final   Special Requests NONE  Final   Gram Stain NO WBC SEEN NO ORGANISMS SEEN   Final   Culture NO GROWTH 2 DAYS  Final   Report Status PENDING  Incomplete  Culture, sputum-assessment     Status: None   Collection Time: 08/15/15  3:57 PM  Result Value Ref Range Status   Specimen Description EXPECTORATED  SPUTUM  Final   Special Requests NONE  Final   Sputum evaluation THIS SPECIMEN IS ACCEPTABLE FOR SPUTUM CULTURE  Final   Report Status 08/15/2015 FINAL  Final  Culture, respiratory (NON-Expectorated)     Status: None (Preliminary result)   Collection Time: 08/15/15  3:57 PM  Result Value Ref Range Status   Specimen Description EXPECTORATED SPUTUM  Final   Special Requests NONE Reflexed from [id number]  Final   Gram Stain   Final    FEW WBC SEEN FEW GRAM POSITIVE COCCI IN PAIRS RARE GRAM NEGATIVE RODS GOOD SPECIMEN - 80-90% WBCS    Culture TOO YOUNG TO READ  Final   Report Status PENDING  Incomplete    Coagulation Studies: No results for input(s): LABPROT, INR in the last 72 hours.  Urinalysis: No results for input(s): COLORURINE, LABSPEC, PHURINE, GLUCOSEU, HGBUR, BILIRUBINUR, KETONESUR, PROTEINUR, UROBILINOGEN, NITRITE, LEUKOCYTESUR in the last 72 hours.  Invalid input(s): APPERANCEUR    Imaging: Dg Chest 1 View  08/17/2015   CLINICAL DATA:  Followup pneumonia.  EXAM: CHEST  1 VIEW  COMPARISON:  Yesterday.  FINDINGS: Stable enlarged cardiac silhouette and right jugular catheter. Mildly decreased airspace opacity in the right mid and lower lung zone with no significant change in a small right pleural effusion. Stable airspace opacity at the medial left lung base. Thoracic spine degenerative changes.  IMPRESSION: 1. Mildly decreased atelectasis or pneumonia in the right mid and lower lung zone. 2. Stable small right pleural effusion. 3. Stable left lower lobe atelectasis or pneumonia. 4. Stable cardiomegaly.   Electronically Signed   By: Claudie Revering M.D.   On: 08/17/2015 09:44   Dg Chest Port 1 View  08/16/2015   CLINICAL DATA:  ICU patient respiratory failure  EXAM: PORTABLE CHEST - 1 VIEW  COMPARISON:  Radiograph 08/15/2015  FINDINGS: RIGHT central venous line with tip in the distal SVC. Normal cardiac silhouette. There is RIGHT basilar atelectasis with small effusion not changed from  prior. LEFT lung is clear.  IMPRESSION: No interval change. Persistent RIGHT basilar atelectasis and effusion.   Electronically Signed   By: Suzy Bouchard M.D.   On: 08/16/2015 07:55     Medications:     . allopurinol  50 mg Oral Daily  . amiodarone  100 mg Oral Daily  . aspirin EC  81 mg Oral Daily  . budesonide (PULMICORT) nebulizer solution  0.5 mg Nebulization BID  . calcium acetate  1,334  mg Oral TID WC  . cefTAZidime (FORTAZ)  IV  1 g Intravenous Q24H  . Chlorhexidine Gluconate Cloth  6 each Topical Q0600  . cholecalciferol  1,000 Units Oral Daily  . clindamycin (CLEOCIN) IV  300 mg Intravenous 3 times per day  . fluticasone  2 spray Each Nare QHS  . heparin  5,000 Units Subcutaneous 3 times per day  . insulin aspart  0-9 Units Subcutaneous TID WC  . ipratropium-albuterol  3 mL Nebulization Q6H  . mirtazapine  7.5 mg Oral QHS  . multivitamin with minerals  1 tablet Oral Daily  . mupirocin ointment  1 application Nasal BID  . pantoprazole  40 mg Oral Q1200  . sevelamer carbonate  1,600 mg Oral TID WC  . sodium chloride  3 mL Intravenous Q12H  . tiZANidine  4 mg Oral BID   sodium chloride, sodium chloride, acetaminophen **OR** acetaminophen, alteplase, benzonatate, docusate sodium, heparin, lidocaine (PF), lidocaine-prilocaine, ondansetron **OR** ondansetron (ZOFRAN) IV, pentafluoroprop-tetrafluoroeth  Assessment/ Plan:  81 y.o. female Diabetes mellitus type 2 x 20 years, ESRD ON HD, HTN ,Psoriasis, Sleep apnea, CPAP, Recurrent vertigo, Essential tremor, Bilateral knee replacement, ACD, Secondary hyperparathyroidism, Cholcystecomy, Hysterectomy, Bilateral Tubal ligation, Carpal tunnel release, admitted on 08/14/2015 for Hypoglycemia [E16.2] Aspiration pneumonia, unspecified aspiration pneumonia type [J69.0] Hypotension, unspecified hypotension type [I95.9]  1.  ESRD on HD MWF: extra hemodialysis treatment yesterday to assist with respiratory status. Dialysis last two days with  total ultrafiltration of 3 litres.  - will need extra treatment today. UF goal of 2-2.5litres. Will provide midodrine prior to treatment.   2.  Bacterial pneumonia/aspiration pneumonia/pleural effusion:  Had thoracentesis 08/14/15. Wbc has improved  - ID input: clindamycin and ceftazidime  3.  Anemia of CKD:  hgb 8.1,  continue epogen 10000 units IV with HD on MWF.   4.  SHPTH:  Phos 2.6 - on calcium acetate and sevelamer - hold cinacalcet - PTH 43 on 8/22  5.  Hypotension:  restart midodine before treatment.    LOS: [place], [name] 9/4/201610:28 AM

## 2015-08-17 NOTE — Progress Notes (Signed)
Paged Dr. Doy Hutching to ask if patient can be transferred off unit with tele. Received approval and will put in for transfer.

## 2015-08-17 NOTE — Progress Notes (Signed)
PRE HD   

## 2015-08-17 NOTE — Progress Notes (Signed)
POST HD   08/17/15 1745  Report  Report Received From MADDIE HIMES, RN  Vital Signs  Temp 97.5 F (36.4 C)  Temp Source Oral  Pulse Rate 95  Pulse Rate Source Monitor  Resp (!) 24  BP 109/61 mmHg  BP Location Right Arm  BP Method Automatic  Patient Position (if appropriate) Lying  Dialysis Weight  Weight 104.3 kg (229 lb 15 oz)  Type of Weight Post-Dialysis  During Hemodialysis Assessment  Intra-Hemodialysis Comments HD TREATMENT END.  GOAL MET.  PT ALERT AND VS STABLE. 15G NEEDLES REMOVED PER POLICY.    Post-Hemodialysis Assessment  Rinseback Volume (mL) 250 mL  Dialyzer Clearance Clear  Duration of HD Treatment -hour(s) 3 hour(s)  Hemodialysis Intake (mL) 500 mL  UF Total -Machine (mL) 3000 mL  Net UF (mL) 2500 mL  Tolerated HD Treatment Yes  Post-Hemodialysis Comments HD TREATMENT END.  GOAL MET.  PT ALERT AND VS STABLE. 15G NEEDLES REMOVED PER POLICY.    AVG/AVF Arterial Site Held (minutes) 7 minutes  AVG/AVF Venous Site Held (minutes) 7 minutes  Education / Care Plan  Hemodialysis Education Provided Yes  Documented Education in Clinical Pathway Yes  Fistula / Graft Left Forearm Arteriovenous fistula  No Placement Date or Time found.   Placed prior to admission: Yes  Orientation: Left  Access Location: Forearm  Access Type: Arteriovenous fistula  Site Condition No complications  Fistula / Graft Assessment Present;Thrill;Bruit  Status Deaccessed  Needle Size 15  Drainage Description None

## 2015-08-17 NOTE — Progress Notes (Signed)
POST HD   08/17/15 1745  Neurological  Level of Consciousness Alert  Orientation Level Oriented X4  Respiratory  Respiratory Pattern Regular;Shallow  Chest Assessment Chest expansion symmetrical  Bilateral Breath Sounds Diminished;Expiratory wheezes  R Upper  Breath Sounds Expiratory wheezes  L Upper Breath Sounds Expiratory wheezes  R Lower Breath Sounds Diminished  L Lower Breath Sounds Clear;Expiratory wheezes  Cough Non-productive;Congested  Cardiac  Pulse Regular  Heart Sounds S1, S2  ECG Monitor Yes  Vascular  Edema Generalized;Right upper extremity;Left upper extremity;Right lower extremity;Left lower extremity  Generalized Edema +1  RUE Edema +3  LUE Edema +3  RLE Edema +3  LLE Edema +3  Integumentary  Integumentary (WDL) X  Skin Color Appropriate for ethnicity  Skin Condition Dry;Flaky  Skin Integrity Intact  Ecchymosis Location Arm  Ecchymosis Location Orientation Bilateral  Musculoskeletal  Musculoskeletal (WDL) X  Generalized Weakness Yes  Assistive Device Wheelchair  Gastrointestinal  Bowel Sounds Assessment Active  GU Assessment  Genitourinary (WDL) X  Genitourinary Symptoms Other (Comment) (HD)  Psychosocial  Psychosocial (WDL) WDL  Emotional support given Given to patient

## 2015-08-17 NOTE — Progress Notes (Signed)
[name] is a 81 y.o. female  Healthcare-associated pneumonia   SUBJECTIVE:  Pt in NAD. Stable on [place] O2. CXR stable. BP good off pressors. Tolerating HD well.  ______________________________________________________________________  ROS: Review of systems is unremarkable for any active cardiac,respiratory, GI, GU, hematologic, neurologic or psychiatric systems, 10 systems reviewed.  @CMEDLIST @  Past Medical History  Diagnosis Date  . Skin cancer     Resected from legs  . Renal insufficiency     Patient is on dialysis and normal days are M,W and F.  . Diabetes mellitus without complication     Patient takes Insulin  . ESRD (end stage renal disease)     Monday, wednesday, Friday DIalysis  . HTN (hypertension)   . Essential tremor   . OSA (obstructive sleep apnea)   . A-fib     not on anticoagulation  . Bilateral lower extremity edema   . Osteoarthritis   . Dysrhythmia   . CHF (congestive heart failure)   . Anemia     Past Surgical History  Procedure Laterality Date  . Abdominal hysterectomy    . Cholecystectomy    . Femur fracture surgery      left femur  . Tubal ligation    . Ankle fracture surgery      right ankle  . Neuroplasty / transposition median nerve at carpal tunnel bilateral    . Knee arthroplasty    . Peripheral vascular catheterization Left 08/06/2015    Procedure: A/V Shuntogram/Fistulagram;  Surgeon: Algernon Huxley, MD;  Location: [facility];  Service: Cardiovascular;  Laterality: Left;    PHYSICAL EXAM:  BP 127/57 mmHg  Pulse 93  Temp(Src) 97.9 F (36.6 C) (Oral)  Resp 18  Ht 5\' 1"  (1.549 m)  Wt 104.237 kg (229 lb 12.8 oz)  BMI 43.44 kg/m2  SpO2 94%  Wt Readings from Last 3 Encounters:  08/17/15 104.237 kg (229 lb 12.8 oz)  08/08/15 97.1 kg (214 lb 1.1 oz)            Constitutional: NAD Neck: supple, no thyromegaly Respiratory: basilar rhonchi Cardiovascular: RRR, 2/6 systolic murmur, no gallop Abdomen: soft, good BS,  nontender Extremities: 1+ edema Neuro: alert and oriented, no focal motor or sensory deficits  ASSESSMENT/PLAN:  Labs and imaging studies were reviewed  No change in care. CXR today. Continue IV ABX. Repeat labs in AM. HD per Nephrology.

## 2015-08-17 NOTE — Progress Notes (Signed)
   08/17/15 1415  Neurological  Level of Consciousness Alert  Orientation Level Oriented X4  Respiratory  Respiratory Pattern Regular;Shallow  Chest Assessment Chest expansion symmetrical  Bilateral Breath Sounds Diminished;Expiratory wheezes  R Upper  Breath Sounds Expiratory wheezes  L Upper Breath Sounds Expiratory wheezes  R Lower Breath Sounds Diminished  L Lower Breath Sounds Clear;Expiratory wheezes  Cough Non-productive;Congested  Cardiac  Pulse Regular  Heart Sounds S1, S2  ECG Monitor Yes  Vascular  Edema Generalized;Right upper extremity;Left upper extremity;Right lower extremity;Left lower extremity  Generalized Edema +1  RUE Edema +3  LUE Edema +3  RLE Edema +3  LLE Edema +3  Integumentary  Integumentary (WDL) X  Skin Color Appropriate for ethnicity  Skin Condition Dry;Flaky  Skin Integrity Intact  Ecchymosis Location Arm  Ecchymosis Location Orientation Bilateral  Musculoskeletal  Musculoskeletal (WDL) X  Generalized Weakness Yes  Assistive Device Wheelchair  Gastrointestinal  Bowel Sounds Assessment Active  GU Assessment  Genitourinary (WDL) X  Genitourinary Symptoms Other (Comment) (HD)  Psychosocial  Psychosocial (WDL) WDL  Emotional support given Given to patient

## 2015-08-17 NOTE — Progress Notes (Signed)
HD START 

## 2015-08-17 NOTE — Progress Notes (Signed)
PRE HD   08/17/15 1415  Vital Signs  Temp 98.7 F (37.1 C)  Temp Source Oral  Pulse Rate 90  Pulse Rate Source Monitor  Resp 20  BP 130/66 mmHg  BP Location Right Arm  BP Method Automatic  Patient Position (if appropriate) Lying  Oxygen Therapy  SpO2 100 %  O2 Device Nasal Cannula  O2 Flow Rate (L/min) 2 L/min  Pain Assessment  Pain Assessment No/denies pain  Pain Score 0  Dialysis Weight  Weight 108.1 kg (238 lb 5.1 oz)  Type of Weight Pre-Dialysis  Time-Out for Hemodialysis  What Procedure? HEMODIALYSIS  Pt Identifiers(min of two) First/Last Name;MRN/Account#  Correct Site? Yes  Correct Side? Yes  Correct Procedure? Yes  Consents Verified? Yes  Rad Studies Available? N/A  Safety Precautions Reviewed? Yes  Engineer, civil (consulting) Number [id number]  Station Number 4  UF/Alarm Test Passed  Conductivity: Meter 13.8  Conductivity: Machine  13.9  pH 7.4  Reverse Osmosis MAIN  Dialyzer Lot Number [id number]  Disposable Set Lot Number [id number]  Machine Temperature 98.6 F (37 C)  Musician and Audible Yes  Blood Lines Intact and Secured Yes  Pre Treatment Patient Checks  Vascular access used during treatment Fistula  Hepatitis B Surface Antigen Results Negative  Date Hepatitis B Surface Antigen Drawn 07/21/15  Isolation Initiated Yes (MRSA)  Hemodialysis Consent Verified Yes  Hemodialysis Standing Orders Initiated Yes  ECG (Telemetry) Monitor On Yes  Prime Ordered Normal Saline  Length of  DialysisTreatment -hour(s) 3 Hour(s)  Dialysis Treatment Comments GOAL TO REMOVE 2.5L OVER 3 HOURS PT ALERT AND VS STABLE.  PT RECEIVED BP SUPPORT BEFORE TREATMENT.  15G NEEDLES INSERTED WITHOUT DIFFICULTY.   Dialyzer Optiflux 180 NR  Dialysate 2K, 2.5 Ca  Dialysis Anticoagulant None  Dialysate Flow Ordered 600  Blood Flow Rate Ordered 400 mL/min  Ultrafiltration Goal 2.5 Liters  Pre Treatment Labs Renal panel;CBC  Dialysis Blood Pressure Support Ordered Normal Saline   Fistula / Graft Left Forearm Arteriovenous fistula  No Placement Date or Time found.   Placed prior to admission: Yes  Orientation: Left  Access Location: Forearm  Access Type: Arteriovenous fistula  Site Condition No complications  Fistula / Graft Assessment Present;Thrill;Bruit  Status Accessed  Needle Size 15  Drainage Description None

## 2015-08-18 LAB — CBC WITH DIFFERENTIAL/PLATELET
BASOS ABS: 0.1 10*3/uL (ref 0–0.1)
BASOS PCT: 1 %
EOS PCT: 3 %
Eosinophils Absolute: 0.2 10*3/uL (ref 0–0.7)
HEMATOCRIT: 24.2 % — AB (ref 35.0–47.0)
Hemoglobin: 8 g/dL — ABNORMAL LOW (ref 12.0–16.0)
Lymphocytes Relative: 18 %
Lymphs Abs: 1.3 10*3/uL (ref 1.0–3.6)
MCH: 34.2 pg — ABNORMAL HIGH (ref 26.0–34.0)
MCHC: 32.9 g/dL (ref 32.0–36.0)
MCV: 103.8 fL — ABNORMAL HIGH (ref 80.0–100.0)
MONO ABS: 1 10*3/uL — AB (ref 0.2–0.9)
Monocytes Relative: 13 %
NEUTROS ABS: 4.8 10*3/uL (ref 1.4–6.5)
Neutrophils Relative %: 65 %
PLATELETS: 118 10*3/uL — AB (ref 150–440)
RBC: 2.33 MIL/uL — AB (ref 3.80–5.20)
RDW: 15.5 % — AB (ref 11.5–14.5)
WBC: 7.3 10*3/uL (ref 3.6–11.0)

## 2015-08-18 LAB — COMPREHENSIVE METABOLIC PANEL
ALT: 9 U/L — AB (ref 14–54)
AST: 19 U/L (ref 15–41)
Albumin: 2.2 g/dL — ABNORMAL LOW (ref 3.5–5.0)
Alkaline Phosphatase: 105 U/L (ref 38–126)
Anion gap: 5 (ref 5–15)
BUN: 5 mg/dL — AB (ref 6–20)
CHLORIDE: 97 mmol/L — AB (ref 101–111)
CO2: 34 mmol/L — ABNORMAL HIGH (ref 22–32)
Calcium: 7.9 mg/dL — ABNORMAL LOW (ref 8.9–10.3)
Creatinine, Ser: 2.18 mg/dL — ABNORMAL HIGH (ref 0.44–1.00)
GFR calc Af Amer: 24 mL/min — ABNORMAL LOW (ref >60)
GFR calc non Af Amer: 20 mL/min — ABNORMAL LOW (ref >60)
GLUCOSE: 149 mg/dL — AB (ref 65–99)
POTASSIUM: 3.2 mmol/L — AB (ref 3.5–5.1)
Sodium: 136 mmol/L (ref 135–145)
Total Bilirubin: 0.5 mg/dL (ref 0.3–1.2)
Total Protein: 5.3 g/dL — ABNORMAL LOW (ref 6.5–8.1)

## 2015-08-18 LAB — GLUCOSE, CAPILLARY
GLUCOSE-CAPILLARY: 148 mg/dL — AB (ref 65–99)
GLUCOSE-CAPILLARY: 159 mg/dL — AB (ref 65–99)
GLUCOSE-CAPILLARY: 208 mg/dL — AB (ref 65–99)
Glucose-Capillary: 140 mg/dL — ABNORMAL HIGH (ref 65–99)

## 2015-08-18 LAB — BODY FLUID CULTURE
CULTURE: NO GROWTH
Gram Stain: NONE SEEN

## 2015-08-18 LAB — PHOSPHORUS: Phosphorus: 1.6 mg/dL — ABNORMAL LOW (ref 2.5–4.6)

## 2015-08-18 MED ORDER — AMIODARONE HCL 200 MG PO TABS
100.0000 mg | ORAL_TABLET | Freq: Two times a day (BID) | ORAL | Status: DC
  Administered 2015-08-18 – 2015-08-21 (×6): 100 mg via ORAL
  Filled 2015-08-18 (×8): qty 1

## 2015-08-18 MED ORDER — MIDODRINE HCL 5 MG PO TABS
5.0000 mg | ORAL_TABLET | Freq: Once | ORAL | Status: AC
  Administered 2015-08-18: 5 mg via ORAL
  Filled 2015-08-18: qty 1

## 2015-08-18 MED ORDER — EPOETIN ALFA 10000 UNIT/ML IJ SOLN
10000.0000 [IU] | Freq: Once | INTRAMUSCULAR | Status: AC
  Administered 2015-08-18: 10000 [IU] via INTRAVENOUS

## 2015-08-18 MED ORDER — POTASSIUM CHLORIDE 20 MEQ PO PACK
20.0000 meq | PACK | Freq: Two times a day (BID) | ORAL | Status: DC
  Administered 2015-08-18 – 2015-08-21 (×6): 20 meq via ORAL
  Filled 2015-08-18 (×6): qty 1

## 2015-08-18 MED ORDER — METOPROLOL TARTRATE 25 MG PO TABS
25.0000 mg | ORAL_TABLET | Freq: Two times a day (BID) | ORAL | Status: DC
  Administered 2015-08-18 – 2015-08-21 (×6): 25 mg via ORAL
  Filled 2015-08-18 (×7): qty 1

## 2015-08-18 NOTE — Progress Notes (Signed)
Post hd tx 

## 2015-08-18 NOTE — Care Management (Signed)
Met with patient to discuss discharge planning. Patient is from [facility] for short term rehab. [facility] can meet patient's needs including dialysis for an extended time. Patient wants to go home but interested in going to [facility]. She is hopeful that she can receive treatment and rehab at [facility] and then be able to "go home". Patient asked that I speak to her husband when he arrives about this option. RNCM to speak with MD to assist with determination on discharge needs. RNCM contact card shared with patient to give to her husband.

## 2015-08-18 NOTE — Progress Notes (Signed)
Pre-hd tx 

## 2015-08-18 NOTE — Consult Note (Signed)
[facility] Cardiology  CARDIOLOGY CONSULT NOTE  Patient ID: [name] MRN: [id number] DOB/AGE: 79-Apr-1935 79 y.o.  Admit date: 08/14/2015 Referring Physician [facility] Primary Physician Emily Filbert M.D. Primary Cardiologist Serafina Royals M.D. Reason for Consultation atrial fibrillation  HPI: the patient is a 81 year old female with end-stage renal disease, paroxysmal atrial fibrillation, sleep apnea, hospitalized on 08/14/15 with community-acquired pneumonia, referred for evaluation of atrial fibrillation with a rapid ventricular rate. This morning, patient was undergoing her scheduled hemodialysis, noted be in atrial fibrillation with a rate of 100-110 bpm which is typical,experienced a coughing spell, and heart rate increased to 180 BPM. Patient was given morning dose of metoprolol, now in atrial fibrillation with a rate of 110 bpm. The patient denies chest pain or current palpitations. Amiodarone was recently increased to 100 mg twice a day for better rate as well as rhythm control.  Review of systems complete and found to be negative unless listed above     Past Medical History  Diagnosis Date  . Skin cancer     Resected from legs  . Renal insufficiency     Patient is on dialysis and normal days are M,W and F.  . Diabetes mellitus without complication     Patient takes Insulin  . ESRD (end stage renal disease)     Monday, wednesday, Friday DIalysis  . HTN (hypertension)   . Essential tremor   . OSA (obstructive sleep apnea)   . A-fib     not on anticoagulation  . Bilateral lower extremity edema   . Osteoarthritis   . Dysrhythmia   . CHF (congestive heart failure)   . Anemia     Past Surgical History  Procedure Laterality Date  . Abdominal hysterectomy    . Cholecystectomy    . Femur fracture surgery      left femur  . Tubal ligation    . Ankle fracture surgery      right ankle  . Neuroplasty / transposition median nerve at carpal tunnel bilateral    . Knee arthroplasty     . Peripheral vascular catheterization Left 08/06/2015    Procedure: A/V Shuntogram/Fistulagram;  Surgeon: Algernon Huxley, MD;  Location: [facility] CV [facility];  Service: Cardiovascular;  Laterality: Left;    Prescriptions prior to admission  Medication Sig Dispense Refill Last Dose  . acetaminophen (TYLENOL) 325 MG tablet Take 2 tablets (650 mg total) by mouth every 6 (six) hours as needed for mild pain (or Fever >/= 101). 60 tablet 3   . allopurinol (ZYLOPRIM) 100 MG tablet Take 50 mg by mouth daily.   08/02/2015 at Unknown time  . amiodarone (PACERONE) 100 MG tablet Take 1 tablet (100 mg total) by mouth daily. 30 tablet 1   . benzonatate (TESSALON) 100 MG capsule Take 1 capsule (100 mg total) by mouth 3 (three) times daily as needed for cough. 20 capsule 0   . calcium acetate (PHOSLO) 667 MG capsule Take 2 capsules (1,334 mg total) by mouth 3 (three) times daily with meals. 90 capsule 1   . cefTRIAXone 1 g in dextrose 5 % 50 mL Inject 1 g into the vein daily. 14 mL 0   . Cholecalciferol (VITAMIN D3) 1000 UNITS CAPS Take 1,000 Units by mouth daily.   08/02/2015 at Unknown time  . clindamycin (CLEOCIN) 300 MG capsule Take 1 capsule (300 mg total) by mouth 3 (three) times daily. 21 capsule 0   . dextromethorphan (DELSYM) 30 MG/5ML liquid Take 5 mLs (30 mg  total) by mouth 2 (two) times daily. 89 mL 0   . diltiazem (CARDIZEM CD) 180 MG 24 hr capsule Take 1 capsule (180 mg total) by mouth daily. 30 capsule 11   . docusate sodium (COLACE) 100 MG capsule Take 1 capsule (100 mg total) by mouth 2 (two) times daily as needed for mild constipation. 10 capsule 0   . fluticasone (FLONASE) 50 MCG/ACT nasal spray Place 2 sprays into both nostrils at bedtime.   08/01/2015 at Unknown time  . guaiFENesin (MUCINEX) 600 MG 12 hr tablet Take 1 tablet (600 mg total) by mouth 2 (two) times daily. 30 tablet 0   . HYDROcodone-acetaminophen (NORCO/VICODIN) 5-325 MG per tablet Take 1 tablet by mouth every 6 (six) hours as needed.  For pain. 60 tablet 0   . insulin aspart protamine- aspart (NOVOLOG MIX 70/30) (70-30) 100 UNIT/ML injection Inject 15-40 Units into the skin 2 (two) times daily with a meal. Depending on blood sugar.   08/02/2015 at Unknown time  . ipratropium-albuterol (DUONEB) 0.5-2.5 (3) MG/3ML SOLN Take 3 mLs by nebulization 4 (four) times daily. 360 mL 0   . meclizine (ANTIVERT) 25 MG tablet Take 25 mg by mouth 3 (three) times daily as needed. For dizziness.   Past Month at Unknown time  . mirtazapine (REMERON) 7.5 MG tablet Take 7.5 mg by mouth at bedtime.   08/01/2015 at Unknown time  . Multiple Vitamin (DAILY VITE PO) Take 1 tablet by mouth daily.   08/02/2015 at Unknown time  . ondansetron (ZOFRAN) 4 MG/2ML SOLN injection Inject 2 mLs (4 mg total) into the vein every 6 (six) hours as needed for nausea or vomiting. 2 mL 0   . pantoprazole (PROTONIX) 40 MG tablet Take 40 mg by mouth daily.   unknown  . promethazine (PHENERGAN) 25 MG tablet Take 25 mg by mouth 2 (two) times daily as needed. For nausea/vomiting.   08/01/2015 at Unknown time  . ranitidine (ZANTAC) 300 MG tablet Take 300 mg by mouth daily.   08/01/2015 at Unknown time  . RENVELA 800 MG tablet Take 1,600 mg by mouth 3 (three) times daily with meals. And with snacks.   08/02/2015 at Unknown time  . SENSIPAR 30 MG tablet Take 30 mg by mouth daily.   unknown  . sevelamer carbonate (RENVELA) 800 MG tablet Take 2 tablets (1,600 mg total) by mouth 3 (three) times daily with meals. 180 tablet 1   . tiZANidine (ZANAFLEX) 4 MG tablet Take 4 mg by mouth 2 (two) times daily.   08/02/2015 at Unknown time  . vancomycin (VANCOCIN) 1 GM/200ML SOLN Inject 200 mLs (1,000 mg total) into the vein every Monday, Wednesday, and Friday with hemodialysis. 4000 mL 0    Social History   Social History  . Marital Status: Married    Spouse Name: N/A  . Number of Children: N/A  . Years of Education: N/A   Occupational History  . Not on file.   Social History Main Topics   . Smoking status: Never Smoker   . Smokeless tobacco: Never Used  . Alcohol Use: No  . Drug Use: No  . Sexual Activity: Not on file   Other Topics Concern  . Not on file   Social History Narrative    Family History  Problem Relation Age of Onset  . Diabetes type II Father       Review of systems complete and found to be negative unless listed above      PHYSICAL  EXAM  General: Well developed, well nourished, in no acute distress HEENT:  Normocephalic and atramatic Neck:  No JVD.  Lungs: Clear bilaterally to auscultation and percussion. Heart: HRRR . Normal S1 and S2 without gallops or murmurs.  Abdomen: Bowel sounds are positive, abdomen soft and non-tender  Msk:  Back normal, normal gait. Normal strength and tone for age. Extremities: No clubbing, cyanosis or edema.   Neuro: Alert and oriented X 3. Psych:  Good affect, responds appropriately  Labs:   Lab Results  Component Value Date   WBC 7.3 08/18/2015   HGB 8.0* 08/18/2015   HCT 24.2* 08/18/2015   MCV 103.8* 08/18/2015   PLT 118* 08/18/2015    Recent Labs Lab 08/18/15 0630  NA 136  K 3.2*  CL 97*  CO2 34*  BUN 5*  CREATININE 2.18*  CALCIUM 7.9*  PROT 5.3*  BILITOT 0.5  ALKPHOS 105  ALT 9*  AST 19  GLUCOSE 149*   Lab Results  Component Value Date   TROPONINI 0.03 08/14/2015    Lab Results  Component Value Date   CHOL 108 09/09/2012   Lab Results  Component Value Date   HDL 29* 09/09/2012   Lab Results  Component Value Date   LDLCALC 65 09/09/2012   Lab Results  Component Value Date   TRIG 108 08/04/2015   TRIG 71 09/09/2012   No results found for: CHOLHDL No results found for: LDLDIRECT    Radiology: Dg Chest 1 View  08/17/2015   CLINICAL DATA:  Followup pneumonia.  EXAM: CHEST  1 VIEW  COMPARISON:  Yesterday.  FINDINGS: Stable enlarged cardiac silhouette and right jugular catheter. Mildly decreased airspace opacity in the right mid and lower lung zone with no significant  change in a small right pleural effusion. Stable airspace opacity at the medial left lung base. Thoracic spine degenerative changes.  IMPRESSION: 1. Mildly decreased atelectasis or pneumonia in the right mid and lower lung zone. 2. Stable small right pleural effusion. 3. Stable left lower lobe atelectasis or pneumonia. 4. Stable cardiomegaly.   Electronically Signed   By: Claudie Revering M.D.   On: 08/17/2015 09:44   Dg Chest 1 View  08/05/2015   CLINICAL DATA:  Right IJ placed  EXAM: CHEST  1 VIEW  COMPARISON:  08/05/2015  FINDINGS: There is a right IJ catheter with tip at the cavoatrial junction. There is moderate cardiac enlargement. Large right pleural effusion is similar in volume to the previous exam. The left lung is relatively clear.  IMPRESSION: Tip of the right IJ catheter is in the cavoatrial junction.  Large right pleural effusion as before.   Electronically Signed   By: Kerby Moors M.D.   On: 08/05/2015 19:51   Dg Chest 2 View  08/03/2015   CLINICAL DATA:  RIGHT pleural effusion.  EXAM: CHEST  2 VIEW  COMPARISON:  08/03/2015.  FINDINGS: Moderate to large RIGHT pleural effusion is present with compressive atelectasis. Cardiopericardial silhouette partially obscured. The cardiopericardial silhouette does appear enlarged. Monitoring leads project over the chest. No LEFT pleural effusion.  IMPRESSION: Moderate to large RIGHT pleural effusion.   Electronically Signed   By: Dereck Ligas M.D.   On: 08/03/2015 15:49   Ct Chest W Contrast  07/24/2015   CLINICAL DATA:  81 year old female with cough since March 2016, worsening over the past 2 weeks, now productive of yellow and yellowish green sputum. Shortness of breath on exertion.  EXAM: CT CHEST, ABDOMEN, AND PELVIS WITH CONTRAST  TECHNIQUE: Multidetector CT imaging of the chest, abdomen and pelvis was performed following the standard protocol during bolus administration of intravenous contrast.  CONTRAST:  139mL OMNIPAQUE IOHEXOL 300 MG/ML  SOLN   COMPARISON:  CT the abdomen and pelvis 03/04/2015. CT of the chest 09/16/2013.  FINDINGS: CT CHEST FINDINGS  Mediastinum/Lymph Nodes: Heart size is enlarged with biatrial dilatation. There is no significant pericardial fluid, thickening or pericardial calcification. Severe tooth thickening calcification of the mitral valve and mitral annulus. There is atherosclerosis of the thoracic aorta, the great vessels of the mediastinum and the coronary arteries, including calcified atherosclerotic plaque in the left main and right coronary arteries. Numerous prominent borderline enlarged mediastinal and bilateral hilar lymph nodes, likely reactive. Esophagus is unremarkable in appearance. No axillary lymphadenopathy.  Lungs/Pleura: Extensive volume loss and airspace consolidation in the lower lobes of the lungs bilaterally. Many of the lower lobe bronchi appear filled with fluid, presumably retained secretions and/or aspirated material. Small bilateral pleural effusions (right greater than left) layering dependently. There is also some patchy peribronchovascular ground-glass attenuation and micronodularity in the dependent portion of the right upper lobe. No other larger more suspicious appearing pulmonary nodules or masses are identified.  Musculoskeletal/Soft Tissues: There are no aggressive appearing lytic or blastic lesions noted in the visualized portions of the skeleton.  CT ABDOMEN AND PELVIS FINDINGS  Hepatobiliary: The liver has a slightly shrunken appearance and mildly nodular contour, suggestive of underlying cirrhosis. No discrete cystic or solid hepatic lesion. No intra or extrahepatic biliary ductal dilatation. Gallbladder is not visualized, presumably surgically absent (no surgical clips are noted in the gallbladder fossa).  Pancreas: No pancreatic mass. No pancreatic ductal dilatation. No pancreatic or peripancreatic fluid or inflammatory changes.  Spleen: Unremarkable.  Adrenals/Urinary Tract: Multiple small  calcifications are noted within the collecting systems of the kidneys bilaterally, likely a combination of vascular calcifications and nonobstructive calculi. The largest nonobstructive calculus is in the interpolar region of the left kidney measuring 3 mm. Multiple sub cm low-attenuation lesions in the kidneys bilaterally too small to definitively characterize, but are favored to represent tiny cysts. 1.9 cm low-attenuation lesion in the interpolar region of the left kidney is compatible with a simple cyst. Bilateral adrenal glands are normal in appearance. No hydroureteronephrosis. Urinary bladder is normal in appearance.  Stomach/Bowel: The appearance of the stomach is normal. No pathologic dilatation of small bowel or colon.  Vascular/Lymphatic: Atherosclerosis throughout the abdominal and pelvic vasculature, without evidence of aneurysm or dissection. No lymphadenopathy noted in the abdomen or pelvis.  Reproductive: Status post hysterectomy. Ovaries are not confidently identified may be surgically absent or atrophic.  Other: Tiny umbilical hernia containing only omental fat incidentally noted. No significant volume of ascites. No pneumoperitoneum.  Musculoskeletal: Chronic compression fracture of L4 with approximately 30% loss of anterior vertebral body height is unchanged. Cavernous hemangioma and L1 vertebral body again incidentally noted. Old healed fractures of the left superior and inferior pubic rami are incidentally noted. There are no aggressive appearing lytic or blastic lesions noted in the visualized portions of the skeleton.  IMPRESSION: 1. Extensive volume loss and airspace consolidation in the lower lobes of the lungs bilaterally, and to a lesser extent in the posterior aspect of the right upper lobe. Given the patient's chronic symptoms, these findings are concerning for recurrent aspiration with associated aspiration pneumonia, and further evaluation by speech therapy to assess for aspiration is  recommended in the near future. 2. Small bilateral pleural effusions layering dependently (right greater than  left). 3. No acute findings in the abdomen or pelvis. 4. Atherosclerosis, including left main and right coronary artery disease. Assessment for potential risk factor modification, dietary therapy or pharmacologic therapy may be warranted, if clinically indicated. 5. The appearance of the liver suggests underlying cirrhosis. 6. Cardiomegaly with biatrial dilatation. 7. There are calcifications of the mitral valve and annulus. Echocardiographic correlation for evaluation of potential valvular dysfunction may be warranted if clinically indicated. 8. Additional incidental findings, as above.   Electronically Signed   By: Vinnie Langton M.D.   On: 07/24/2015 17:29   Dg Chest Right Decubitus  08/03/2015   CLINICAL DATA:  RIGHT pleural effusion. RIGHT side down decubitus radiographs.  EXAM: CHEST - RIGHT DECUBITUS  COMPARISON:  08/03/2015.  FINDINGS: RIGHT pleural effusion is large. Majority of it fusion layers dependently with decubitus positioning. Compressive atelectasis of the RIGHT lung associated with the effusion.  IMPRESSION: Large dependently layering RIGHT pleural effusion.   Electronically Signed   By: Dereck Ligas M.D.   On: 08/03/2015 15:47   Ct Abdomen Pelvis W Contrast  07/24/2015   CLINICAL DATA:  81 year old female with cough since March 2016, worsening over the past 2 weeks, now productive of yellow and yellowish green sputum. Shortness of breath on exertion.  EXAM: CT CHEST, ABDOMEN, AND PELVIS WITH CONTRAST  TECHNIQUE: Multidetector CT imaging of the chest, abdomen and pelvis was performed following the standard protocol during bolus administration of intravenous contrast.  CONTRAST:  164mL OMNIPAQUE IOHEXOL 300 MG/ML  SOLN  COMPARISON:  CT the abdomen and pelvis 03/04/2015. CT of the chest 09/16/2013.  FINDINGS: CT CHEST FINDINGS  Mediastinum/Lymph Nodes: Heart size is enlarged with  biatrial dilatation. There is no significant pericardial fluid, thickening or pericardial calcification. Severe tooth thickening calcification of the mitral valve and mitral annulus. There is atherosclerosis of the thoracic aorta, the great vessels of the mediastinum and the coronary arteries, including calcified atherosclerotic plaque in the left main and right coronary arteries. Numerous prominent borderline enlarged mediastinal and bilateral hilar lymph nodes, likely reactive. Esophagus is unremarkable in appearance. No axillary lymphadenopathy.  Lungs/Pleura: Extensive volume loss and airspace consolidation in the lower lobes of the lungs bilaterally. Many of the lower lobe bronchi appear filled with fluid, presumably retained secretions and/or aspirated material. Small bilateral pleural effusions (right greater than left) layering dependently. There is also some patchy peribronchovascular ground-glass attenuation and micronodularity in the dependent portion of the right upper lobe. No other larger more suspicious appearing pulmonary nodules or masses are identified.  Musculoskeletal/Soft Tissues: There are no aggressive appearing lytic or blastic lesions noted in the visualized portions of the skeleton.  CT ABDOMEN AND PELVIS FINDINGS  Hepatobiliary: The liver has a slightly shrunken appearance and mildly nodular contour, suggestive of underlying cirrhosis. No discrete cystic or solid hepatic lesion. No intra or extrahepatic biliary ductal dilatation. Gallbladder is not visualized, presumably surgically absent (no surgical clips are noted in the gallbladder fossa).  Pancreas: No pancreatic mass. No pancreatic ductal dilatation. No pancreatic or peripancreatic fluid or inflammatory changes.  Spleen: Unremarkable.  Adrenals/Urinary Tract: Multiple small calcifications are noted within the collecting systems of the kidneys bilaterally, likely a combination of vascular calcifications and nonobstructive calculi. The  largest nonobstructive calculus is in the interpolar region of the left kidney measuring 3 mm. Multiple sub cm low-attenuation lesions in the kidneys bilaterally too small to definitively characterize, but are favored to represent tiny cysts. 1.9 cm low-attenuation lesion in the interpolar region of the left  kidney is compatible with a simple cyst. Bilateral adrenal glands are normal in appearance. No hydroureteronephrosis. Urinary bladder is normal in appearance.  Stomach/Bowel: The appearance of the stomach is normal. No pathologic dilatation of small bowel or colon.  Vascular/Lymphatic: Atherosclerosis throughout the abdominal and pelvic vasculature, without evidence of aneurysm or dissection. No lymphadenopathy noted in the abdomen or pelvis.  Reproductive: Status post hysterectomy. Ovaries are not confidently identified may be surgically absent or atrophic.  Other: Tiny umbilical hernia containing only omental fat incidentally noted. No significant volume of ascites. No pneumoperitoneum.  Musculoskeletal: Chronic compression fracture of L4 with approximately 30% loss of anterior vertebral body height is unchanged. Cavernous hemangioma and L1 vertebral body again incidentally noted. Old healed fractures of the left superior and inferior pubic rami are incidentally noted. There are no aggressive appearing lytic or blastic lesions noted in the visualized portions of the skeleton.  IMPRESSION: 1. Extensive volume loss and airspace consolidation in the lower lobes of the lungs bilaterally, and to a lesser extent in the posterior aspect of the right upper lobe. Given the patient's chronic symptoms, these findings are concerning for recurrent aspiration with associated aspiration pneumonia, and further evaluation by speech therapy to assess for aspiration is recommended in the near future. 2. Small bilateral pleural effusions layering dependently (right greater than left). 3. No acute findings in the abdomen or  pelvis. 4. Atherosclerosis, including left main and right coronary artery disease. Assessment for potential risk factor modification, dietary therapy or pharmacologic therapy may be warranted, if clinically indicated. 5. The appearance of the liver suggests underlying cirrhosis. 6. Cardiomegaly with biatrial dilatation. 7. There are calcifications of the mitral valve and annulus. Echocardiographic correlation for evaluation of potential valvular dysfunction may be warranted if clinically indicated. 8. Additional incidental findings, as above.   Electronically Signed   By: Vinnie Langton M.D.   On: 07/24/2015 17:29   Dg Chest Port 1 View  08/16/2015   CLINICAL DATA:  ICU patient respiratory failure  EXAM: PORTABLE CHEST - 1 VIEW  COMPARISON:  Radiograph 08/15/2015  FINDINGS: RIGHT central venous line with tip in the distal SVC. Normal cardiac silhouette. There is RIGHT basilar atelectasis with small effusion not changed from prior. LEFT lung is clear.  IMPRESSION: No interval change. Persistent RIGHT basilar atelectasis and effusion.   Electronically Signed   By: Suzy Bouchard M.D.   On: 08/16/2015 07:55   Dg Chest Port 1 View  08/15/2015   CLINICAL DATA:  Recurrent pneumonia. Hypotension. Status post thoracentesis 08/14/2015.  EXAM: PORTABLE CHEST - 1 VIEW  COMPARISON:  Single view of the chest 08/14/2015 and 08/05/2015.  FINDINGS: Right pleural effusion and basilar airspace disease have increased since yesterday's examination but still appear markedly improved compared to the pre-thoracentesis chest film. The left lung remains clear. There is cardiomegaly. No pneumothorax is identified. Right IJ catheter remains in place and is unchanged.  IMPRESSION: Increased right pleural effusion and basilar airspace disease since yesterday's examination.   Electronically Signed   By: Inge Rise M.D.   On: 08/15/2015 07:58   Dg Chest Port 1 View  08/14/2015   CLINICAL DATA:  81 year old female status post  thoracentesis. Initial encounter.  EXAM: PORTABLE CHEST - 1 VIEW  COMPARISON:  0117 hours today and earlier.  FINDINGS: Portable AP semi upright view at 1236 hours. Significantly reduced volume of right pleural effusion. No pneumothorax. Improved right lung ventilation. Stable right IJ central line. Stable cardiac size and mediastinal contours. Left lower  lobe collapse or consolidation, left lung ventilation has not significantly changed.  IMPRESSION: 1. No pneumothorax and improved right lung ventilation following thoracentesis. 2. Left lower lobe collapse or consolidation is stable to mildly progressed.   Electronically Signed   By: Genevie Ann M.D.   On: 08/14/2015 14:02   Dg Chest Portable 1 View  08/14/2015   CLINICAL DATA:  Acute onset of shortness of breath and generalized numbness. Initial encounter.  EXAM: PORTABLE CHEST - 1 VIEW  COMPARISON:  Chest radiograph performed 08/05/2015, and CT of the chest performed 811 1,016  FINDINGS: A moderate to large right-sided pleural effusion is noted, with right-sided airspace opacification. Given the appearance on prior CT, this is concerning for worsening aspiration pneumonia. The left lung appears clear. No pneumothorax is seen.  The cardiomediastinal silhouette is mildly enlarged, though not fully characterized. A right IJ line is noted ending about the distal SVC. No acute osseous abnormalities are identified.  IMPRESSION: Moderate to large right-sided pleural effusion, with right-sided airspace opacification. Given the appearance on prior CT, this is concerning for worsening aspiration pneumonia. Mild cardiomegaly noted.   Electronically Signed   By: Garald Balding M.D.   On: 08/14/2015 02:13   Dg Chest Port 1 View  08/05/2015   CLINICAL DATA:  PICC line placement.  EXAM: PORTABLE CHEST - 1 VIEW  COMPARISON:  08/04/2015.  FINDINGS: A right IJ PICC line is noted with tip overlying the superior cavoatrial junction.  Slightly increased right lower lung  atelectasis/right pleural effusion noted.  Cardiomegaly and vascular congestion again noted as well as left basilar atelectasis/ airspace disease.  There is no evidence of pneumothorax.  IMPRESSION: Right central venous catheter/PICC line with tip overlying the superior cavoatrial junction.  Slightly increased right lower lung atelectasis/right pleural effusion.  Unchanged cardiomegaly, pulmonary vascular congestion and left basilar atelectasis/ airspace disease   Electronically Signed   By: Margarette Canada M.D.   On: 08/05/2015 18:34   Dg Chest Port 1 View  08/04/2015   CLINICAL DATA:  Post right-sided thoracentesis  EXAM: PORTABLE CHEST - 1 VIEW  COMPARISON:  08/03/2015 ; 08/02/2015 ; chest CT - 07/24/2015  FINDINGS: Interval reduction in persistent small partially loculated right-sided effusion post thoracentesis. No pneumothorax. Unchanged trace left-sided effusion. Improved aeration of the right lung with persistent bibasilar heterogeneous/consolidative opacities. No new focal airspace opacities. No evidence of edema. No acute osseus abnormalities.  IMPRESSION: 1. Interval reduction in persistent small right-sided effusion post thoracentesis. No pneumothorax. 2. Unchanged trace left-sided pleural effusion. 3. Improved aeration of the right lower lung with persistent bibasilar heterogeneous/consolidative opacities, left greater than right, atelectasis versus infiltrate.   Electronically Signed   By: Sandi Mariscal M.D.   On: 08/04/2015 16:02   Dg Chest Portable 1 View  08/02/2015   CLINICAL DATA:  Cough.  Vomiting for 2 weeks.  Weakness.  EXAM: PORTABLE CHEST - 1 VIEW  COMPARISON:  07/24/2015 CT.  FINDINGS: Cardiopericardial silhouette is upper limits of normal for projection. RIGHT-greater-than-LEFT pleural effusion with bilateral RIGHT-greater-than-LEFT collapse/consolidation at the bases. Comparing to the prior CT, there is little if any interval change. Aortic arch atherosclerosis.  IMPRESSION: Unchanged  bilateral RIGHT-greater-than- LEFT pleural effusions and collapse/consolidation.   Electronically Signed   By: Dereck Ligas M.D.   On: 08/02/2015 10:13   US Thoracentesis Asp Pleural Space W/img Guide  08/04/2015   INDICATION: Symptomatic right sided pleural effusion - please perform ultrasound-guided thoracentesis for diagnostic and therapeutic purposes.  EXAM: US THORACENTESIS ASP  PLEURAL SPACE W/IMG GUIDE  COMPARISON:  Chest radiograph - 08/03/2015  MEDICATIONS: None  COMPLICATIONS: None immediate  TECHNIQUE: Informed written consent was obtained from the patient after a discussion of the risks, benefits and alternatives to treatment. A timeout was performed prior to the initiation of the procedure.  Initial ultrasound scanning demonstrates a small to moderate sized right-sided pleural effusion. The lower chest was prepped and draped in the usual sterile fashion. 1% lidocaine was used for local anesthesia.  Under direct ultrasound guidance, a 19 gauge, 7-cm, Yueh catheter was introduced. An ultrasound image was saved for documentation purposes. The thoracentesis was performed. The catheter was removed and a dressing was applied. The patient tolerated the procedure well without immediate post procedural complication. The patient was escorted to have an upright chest radiograph.  FINDINGS: A total of approximately 1 liter of serous fluid was removed. Requested samples were sent to the laboratory.  IMPRESSION: Successful ultrasound-guided right sided thoracentesis yielding 1 liter of pleural fluid.   Electronically Signed   By: Sandi Mariscal M.D.   On: 08/04/2015 16:07    EKG: atrial fibrillation at a rate of 110 bpm  ASSESSMENT AND PLAN:   1. Atrial fibrillation, episodes of rapid ventricular rate, hemodynamically stable 2. End-stage renal disease on chronic hemodialysis 3. Community-acquired pneumonia  Recommendations  1. Continue current therapy 2. Continue amiodarone 100 mg twice a day 3.  Uptitrate metoprolol to 50 mg twice a day if patient has persistent rapid ventricular rate 4. Consider chronic anticoagulation, per her cardiologist, Dr. Nehemiah Massed   Signed: Isaias Cowman MD,PhD, [facility] 08/18/2015, 1:30 PM

## 2015-08-18 NOTE — Progress Notes (Signed)
Nutrition Follow-up  DOCUMENTATION CODES:   Severe malnutrition in context of acute illness/injury  INTERVENTION:   Meals and Snacks: Cater to patient preferences; Encourage po intake at meal times. May need calorie count on follow if intake poor Medical Food Supplement Therapy: Continue Magic Cup as pt is eating   NUTRITION DIAGNOSIS:   Inadequate oral intake related to poor appetite, nausea as evidenced by per patient/family report, energy intake < or equal to 50% for > or equal to 5 days.  GOAL:   Patient will meet greater than or equal to 90% of their needs; ongoing  MONITOR:    (Energy intake, Electrolyte and renal profile, Glucose profile)   ASSESSMENT:    Pt out of room on visit, in HD. Pt s/p HD yesterday as well with net of 2572mL UF.   Diet Order:  DIET DYS 3 Room service appropriate?: Yes with Assist; Fluid consistency:: Thin SLP following   Current Nutrition: Limited documentation as pt on isolation. CNA Marcene Brawn reported pt took sherbet, [employer]chant [employer] officer and peaches from her breakfast tray this am to eat; did not want pancakes. Pt eating 25% on 08/15/2015.    Gastrointestinal Profile: Last BM: 08/16/2015   Medications: Phoslo, vitamin D, novolog, remeron, protonix, KCl  Electrolyte/Renal Profile and Glucose Profile:   Recent Labs Lab 08/11/15 1234  08/15/15 0958 08/16/15 0449 08/17/15 0530 08/18/15 0630  NA 124*  < >  --  136 136 136  K 5.1  < >  --  3.5 3.5 3.2*  CL 85*  < >  --  97* 97* 97*  CO2 28  < >  --  33* 31 34*  BUN 24*  < >  --  7 6 5*  CREATININE 4.89*  < >  --  2.58* 2.27* 2.18*  CALCIUM 8.6*  < >  --  7.8* 8.0* 7.9*  PHOS 4.0  --  2.6  --   --  1.6*  GLUCOSE 230*  < >  --  160* 129* 149*  < > = values in this interval not displayed. Protein Profile:  Recent Labs Lab 08/16/15 0449 08/17/15 0530 08/18/15 0630  ALBUMIN 2.3* 2.2* 2.2*     Weight Trend since Admission: Filed Weights   08/18/15 0148 08/18/15 0510 08/18/15 0945  Weight:  240 lb 14.4 oz (109.272 kg) 239 lb 8 oz (108.636 kg) 236 lb 8.9 oz (107.3 kg)     BMI:  Body mass index is 44.72 kg/(m^2).  Estimated Nutritional Needs:   Kcal:  1502-1775 kcals (BEE 1050-1.3 AF, 1.1-1.3 IF) Using IBW of 48kg  Protein:  58-72 g (1.2-1.5 g/kg)   Fluid:  1023ml plus UOP  EDUCATION NEEDS:   No education needs identified at this time   Miles, RD, LDN Pager [phone]

## 2015-08-18 NOTE — Progress Notes (Signed)
HD tx complete 

## 2015-08-18 NOTE — Progress Notes (Signed)
[name] is a 81 y.o. female  Healthcare-associated pneumonia   SUBJECTIVE:  Pt c/o "swelling". Had HD yesterday per Nephrology. Still SOB but stable on [place] O2. Back in rapid A-fib. Denies CP.  ______________________________________________________________________  ROS: Review of systems is unremarkable for any active cardiac,respiratory, GI, GU, hematologic, neurologic or psychiatric systems, 10 systems reviewed.  @CMEDLIST @  Past Medical History  Diagnosis Date  . Skin cancer     Resected from legs  . Renal insufficiency     Patient is on dialysis and normal days are M,W and F.  . Diabetes mellitus without complication     Patient takes Insulin  . ESRD (end stage renal disease)     Monday, wednesday, Friday DIalysis  . HTN (hypertension)   . Essential tremor   . OSA (obstructive sleep apnea)   . A-fib     not on anticoagulation  . Bilateral lower extremity edema   . Osteoarthritis   . Dysrhythmia   . CHF (congestive heart failure)   . Anemia     Past Surgical History  Procedure Laterality Date  . Abdominal hysterectomy    . Cholecystectomy    . Femur fracture surgery      left femur  . Tubal ligation    . Ankle fracture surgery      right ankle  . Neuroplasty / transposition median nerve at carpal tunnel bilateral    . Knee arthroplasty    . Peripheral vascular catheterization Left 08/06/2015    Procedure: A/V Shuntogram/Fistulagram;  Surgeon: Algernon Huxley, MD;  Location: [facility];  Service: Cardiovascular;  Laterality: Left;    PHYSICAL EXAM:  BP 136/50 mmHg  Pulse 93  Temp(Src) 97.6 F (36.4 C) (Axillary)  Resp 18  Ht 5\' 1"  (1.549 m)  Wt 108.636 kg (239 lb 8 oz)  BMI 45.28 kg/m2  SpO2 99%  Wt Readings from Last 3 Encounters:  08/18/15 108.636 kg (239 lb 8 oz)  08/08/15 97.1 kg (214 lb 1.1 oz)            Constitutional: NAD Neck: supple, no thyromegaly Respiratory: scattered wheezes and diffuse rhonchi Cardiovascular: irregularly  irregular, no murmur, no gallop Abdomen: soft, good BS, nontender Extremities: 1+ edema Neuro: alert and oriented, no focal motor or sensory deficits  ASSESSMENT/PLAN:  Labs and imaging studies were reviewed  Will increase Amiodarone to BID and add low dose lopressor. Continue IV ABX and SVN's. HD per Nephrology. Await f/u from Pulmonology. Supplement K+ today. Repeat labs and CXR in AM.

## 2015-08-18 NOTE — Progress Notes (Signed)
[facility]  ROUNDING NOTE   Subjective:   Seen and examined on hemodialysis. Atrial fibrillation with rapid ventricular response.   Objective:  Vital signs in last 24 hours:  Temp:  [97.5 F (36.4 C)-98.7 F (37.1 C)] 98 F (36.7 C) (09/05 0945) Pulse Rate:  [86-128] 128 (09/05 1130) Resp:  [17-36] 20 (09/05 1130) BP: (92-154)/(38-79) 139/56 mmHg (09/05 1130) SpO2:  [91 %-100 %] 100 % (09/05 1100) Weight:  [104.3 kg (229 lb 15 oz)-109.272 kg (240 lb 14.4 oz)] 107.3 kg (236 lb 8.9 oz) (09/05 0945)  Weight change: 2 kg (4 lb 6.6 oz) Filed Weights   08/18/15 0148 08/18/15 0510 08/18/15 0945  Weight: 109.272 kg (240 lb 14.4 oz) 108.636 kg (239 lb 8 oz) 107.3 kg (236 lb 8.9 oz)    Intake/Output: I/O last 3 completed shifts: In: -  Out: 2500 [Other:2500]   Intake/Output this shift:     Physical Exam: General: Chronically ill appearing  Head: Normocephalic, atraumatic. Moist oral mucosal membranes  Eyes: Anicteric, PERRL  Neck: Supple, trachea midline  Lungs:  Clear bilaterally  Heart: Regular rate and rhythm  Abdomen:  Soft, nontender, BS present  Extremities: 1+ peripheral edema.  Neurologic: Nonfocal, moving all four extremities  Skin: No lesions  Access: LUE AVF    Basic Metabolic Panel:  Recent Labs Lab 08/11/15 1234 08/14/15 0114 08/15/15 0517 08/15/15 0958 08/16/15 0449 08/17/15 0530 08/18/15 0630  NA 124* 137 134*  --  136 136 136  K 5.1 3.1* 3.8  --  3.5 3.5 3.2*  CL 85* 95* 95*  --  97* 97* 97*  CO2 28 33* 28  --  33* 31 34*  GLUCOSE 230* 114* 144*  --  160* 129* 149*  BUN 24* 7 10  --  7 6 5*  CREATININE 4.89* 2.50* 3.31*  --  2.58* 2.27* 2.18*  CALCIUM 8.6* 8.1* 7.9*  --  7.8* 8.0* 7.9*  PHOS 4.0  --   --  2.6  --   --  1.6*    Liver Function Tests:  Recent Labs Lab 08/11/15 1234 08/15/15 0517 08/16/15 0449 08/17/15 0530 08/18/15 0630  AST  --  25 22 19 19   ALT  --  13* 12* 10* 9*  ALKPHOS  --  126 107 118 105   BILITOT  --  1.1 0.7 0.6 0.5  PROT  --  6.0* 5.3* 5.4* 5.3*  ALBUMIN 2.3* 2.6* 2.3* 2.2* 2.2*   No results for input(s): LIPASE, AMYLASE in the last 168 hours. No results for input(s): AMMONIA in the last 168 hours.  CBC:  Recent Labs Lab 08/15/15 0517 08/16/15 0449 08/17/15 0530 08/17/15 1457 08/18/15 0630  WBC 15.0* 9.2 7.6 8.5 7.3  NEUTROABS  --  5.8 4.8  --  4.8  HGB 9.2* 8.3* 8.1* 8.5* 8.0*  HCT 28.1* 24.9* 24.8* 25.8* 24.2*  MCV 104.1* 104.3* 103.1* 103.3* 103.8*  PLT 171 122* 117* 127* 118*    Cardiac Enzymes:  Recent Labs Lab 08/14/15 0114  TROPONINI 0.03    BNP: Invalid input(s): POCBNP  CBG:  Recent Labs Lab 08/16/15 1939 08/17/15 0733 08/17/15 1111 08/17/15 1948 08/18/15 0743  GLUCAP 170* 139* 194* 47* 140*    Microbiology: Results for orders placed or performed during the hospital encounter of 08/14/15  MRSA PCR Screening     Status: Abnormal   Collection Time: 08/14/15  5:43 AM  Result Value Ref Range Status   MRSA by PCR POSITIVE (A) NEGATIVE  Final    Comment:        The GeneXpert MRSA Assay (FDA approved for NASAL specimens only), is one component of a comprehensive MRSA colonization surveillance program. It is not intended to diagnose MRSA infection nor to guide or monitor treatment for MRSA infections. CRITICAL RESULT CALLED TO, READ BACK BY AND VERIFIED WITH: BYRON HADDOCK AT [place] ON 08/14/15.Marland KitchenMarland KitchenChristus Southeast [facility] - [facility]   Body fluid culture     Status: None (Preliminary result)   Collection Time: 08/14/15 11:34 AM  Result Value Ref Range Status   Specimen Description PLEURAL  Final   Special Requests NONE  Final   Gram Stain NO WBC SEEN NO ORGANISMS SEEN   Final   Culture NO GROWTH 3 DAYS  Final   Report Status PENDING  Incomplete  Culture, sputum-assessment     Status: None   Collection Time: 08/15/15  3:57 PM  Result Value Ref Range Status   Specimen Description EXPECTORATED SPUTUM  Final   Special Requests NONE  Final   Sputum  evaluation THIS SPECIMEN IS ACCEPTABLE FOR SPUTUM CULTURE  Final   Report Status 08/15/2015 FINAL  Final  Culture, respiratory (NON-Expectorated)     Status: None (Preliminary result)   Collection Time: 08/15/15  3:57 PM  Result Value Ref Range Status   Specimen Description EXPECTORATED SPUTUM  Final   Special Requests NONE Reflexed from [id number]  Final   Gram Stain   Final    FEW WBC SEEN FEW GRAM POSITIVE COCCI IN PAIRS RARE GRAM NEGATIVE RODS GOOD SPECIMEN - 80-90% WBCS    Culture APPEARS TO BE NORMAL FLORA  Final   Report Status PENDING  Incomplete    Coagulation Studies: No results for input(s): LABPROT, INR in the last 72 hours.  Urinalysis: No results for input(s): COLORURINE, LABSPEC, PHURINE, GLUCOSEU, HGBUR, BILIRUBINUR, KETONESUR, PROTEINUR, UROBILINOGEN, NITRITE, LEUKOCYTESUR in the last 72 hours.  Invalid input(s): APPERANCEUR    Imaging: Dg Chest 1 View  08/17/2015   CLINICAL DATA:  Followup pneumonia.  EXAM: CHEST  1 VIEW  COMPARISON:  Yesterday.  FINDINGS: Stable enlarged cardiac silhouette and right jugular catheter. Mildly decreased airspace opacity in the right mid and lower lung zone with no significant change in a small right pleural effusion. Stable airspace opacity at the medial left lung base. Thoracic spine degenerative changes.  IMPRESSION: 1. Mildly decreased atelectasis or pneumonia in the right mid and lower lung zone. 2. Stable small right pleural effusion. 3. Stable left lower lobe atelectasis or pneumonia. 4. Stable cardiomegaly.   Electronically Signed   By: Claudie Revering M.D.   On: 08/17/2015 09:44     Medications:     . allopurinol  50 mg Oral Daily  . amiodarone  100 mg Oral BID  . aspirin EC  81 mg Oral Daily  . budesonide (PULMICORT) nebulizer solution  0.5 mg Nebulization BID  . calcium acetate  1,334 mg Oral TID WC  . cefTAZidime (FORTAZ)  IV  1 g Intravenous Q24H  . Chlorhexidine Gluconate Cloth  6 each Topical Q0600  . cholecalciferol   1,000 Units Oral Daily  . clindamycin (CLEOCIN) IV  300 mg Intravenous 3 times per day  . fluticasone  2 spray Each Nare QHS  . heparin  5,000 Units Subcutaneous 3 times per day  . insulin aspart  0-9 Units Subcutaneous TID WC  . ipratropium-albuterol  3 mL Nebulization Q6H  . metoprolol tartrate  25 mg Oral BID  . mirtazapine  7.5 mg Oral QHS  .  multivitamin with minerals  1 tablet Oral Daily  . mupirocin ointment  1 application Nasal BID  . pantoprazole  40 mg Oral Q1200  . potassium chloride  20 mEq Oral BID  . sevelamer carbonate  1,600 mg Oral TID WC  . sodium chloride  3 mL Intravenous Q12H  . tiZANidine  4 mg Oral BID   sodium chloride, sodium chloride, acetaminophen **OR** acetaminophen, alteplase, benzonatate, docusate sodium, heparin, lidocaine (PF), lidocaine-prilocaine, ondansetron **OR** ondansetron (ZOFRAN) IV, pentafluoroprop-tetrafluoroeth, zolpidem  Assessment/ Plan:  81 y.o. female Diabetes mellitus type 2 x 20 years, ESRD ON HD, HTN ,Psoriasis, Sleep apnea, CPAP, Recurrent vertigo, Essential tremor, Bilateral knee replacement, ACD, Secondary hyperparathyroidism, Cholcystecomy, Hysterectomy, Bilateral Tubal ligation, Carpal tunnel release, admitted on 08/14/2015 for Hypoglycemia [E16.2] Aspiration pneumonia, unspecified aspiration pneumonia type [J69.0] Hypotension, unspecified hypotension type [I95.9]  1.  ESRD on HD MWF: extra hemodialysis treatment last two daysto assist with respiratory status. Dialysis last two days with total ultrafiltration of 5.5 litres.  -  UF goal of 2-2.5litres. Will provide midodrine prior to treatment.  - outpatient EDW of 92.5 kg. If this is real, then she is 15 kg above her dry weight. Will continue daily dialysis at this point.   2.  Bacterial pneumonia/aspiration pneumonia/pleural effusion:  Had thoracentesis 08/14/15. Wbc has improved. Continues productive cough.  - ID input: clindamycin and ceftazidime  3.  Anemia of CKD:  hgb 8,   continue epogen 10000 units IV with HD on MWF.   4.  SHPTH:  Phos 1.6 - on calcium acetate and sevelamer. Will discontinue sevelamer.  - hold cinacalcet - PTH 43 on 8/22  5.  Hypotension:  midodine before treatment.   6. Atrial fibrillation: held metoprolol before dialysis. But she did get her amiodarone.  - consult cardiology. [facility] cardiology.  - will give metoprolol now.    LOS: [place], [place] 9/5/201611:43 AM

## 2015-08-18 NOTE — Care Management Important Message (Signed)
Important Message  Patient Details  Name: [name] MRN: [id number] Date of Birth: 10-07-1934   Medicare Important Message Given:  Yes-second notification given    Marshell Garfinkel, RN 08/18/2015, 11:17 AM

## 2015-08-18 NOTE — Progress Notes (Signed)
Pt transferred to 1A. Report given to [facility]. Pt in no signs/symptoms of distress.

## 2015-08-18 NOTE — Progress Notes (Signed)
HD tx start 

## 2015-08-19 ENCOUNTER — Inpatient Hospital Stay: Payer: Medicare Other

## 2015-08-19 LAB — CBC WITH DIFFERENTIAL/PLATELET
BASOS ABS: 0.1 10*3/uL (ref 0–0.1)
BASOS PCT: 1 %
Eosinophils Absolute: 0.2 10*3/uL (ref 0–0.7)
Eosinophils Relative: 3 %
HEMATOCRIT: 26.8 % — AB (ref 35.0–47.0)
HEMOGLOBIN: 8.8 g/dL — AB (ref 12.0–16.0)
Lymphocytes Relative: 21 %
Lymphs Abs: 1.9 10*3/uL (ref 1.0–3.6)
MCH: 33.9 pg (ref 26.0–34.0)
MCHC: 32.6 g/dL (ref 32.0–36.0)
MCV: 103.8 fL — ABNORMAL HIGH (ref 80.0–100.0)
MONO ABS: 1.5 10*3/uL — AB (ref 0.2–0.9)
Monocytes Relative: 16 %
NEUTROS ABS: 5.3 10*3/uL (ref 1.4–6.5)
NEUTROS PCT: 59 %
Platelets: 133 10*3/uL — ABNORMAL LOW (ref 150–440)
RBC: 2.59 MIL/uL — AB (ref 3.80–5.20)
RDW: 15.8 % — AB (ref 11.5–14.5)
WBC: 9 10*3/uL (ref 3.6–11.0)

## 2015-08-19 LAB — GLUCOSE, CAPILLARY
GLUCOSE-CAPILLARY: 146 mg/dL — AB (ref 65–99)
GLUCOSE-CAPILLARY: 175 mg/dL — AB (ref 65–99)
GLUCOSE-CAPILLARY: 250 mg/dL — AB (ref 65–99)
Glucose-Capillary: 121 mg/dL — ABNORMAL HIGH (ref 65–99)

## 2015-08-19 LAB — COMPREHENSIVE METABOLIC PANEL
ALBUMIN: 2.3 g/dL — AB (ref 3.5–5.0)
ALT: 11 U/L — AB (ref 14–54)
AST: 21 U/L (ref 15–41)
Alkaline Phosphatase: 113 U/L (ref 38–126)
Anion gap: 5 (ref 5–15)
BILIRUBIN TOTAL: 0.4 mg/dL (ref 0.3–1.2)
CHLORIDE: 101 mmol/L (ref 101–111)
CO2: 34 mmol/L — ABNORMAL HIGH (ref 22–32)
Calcium: 8.4 mg/dL — ABNORMAL LOW (ref 8.9–10.3)
Creatinine, Ser: 2.12 mg/dL — ABNORMAL HIGH (ref 0.44–1.00)
GFR calc Af Amer: 24 mL/min — ABNORMAL LOW (ref >60)
GFR calc non Af Amer: 21 mL/min — ABNORMAL LOW (ref >60)
GLUCOSE: 160 mg/dL — AB (ref 65–99)
POTASSIUM: 3.4 mmol/L — AB (ref 3.5–5.1)
Sodium: 140 mmol/L (ref 135–145)
TOTAL PROTEIN: 5.7 g/dL — AB (ref 6.5–8.1)

## 2015-08-19 MED ORDER — ASPIRIN 81 MG PO TBEC: 81.0000 mg | DELAYED_RELEASE_TABLET | Freq: Every day | ORAL | Status: DC

## 2015-08-19 MED ORDER — MIDODRINE HCL 5 MG PO TABS
5.0000 mg | ORAL_TABLET | Freq: Once | ORAL | Status: AC
  Administered 2015-08-19: 5 mg via ORAL
  Filled 2015-08-19: qty 1

## 2015-08-19 MED ORDER — BUDESONIDE 0.5 MG/2ML IN SUSP: 0.5000 mg | Freq: Two times a day (BID) | RESPIRATORY_TRACT | Status: DC

## 2015-08-19 MED ORDER — METOPROLOL TARTRATE 25 MG PO TABS: 25.0000 mg | ORAL_TABLET | Freq: Two times a day (BID) | ORAL | Status: DC

## 2015-08-19 MED ORDER — AMIODARONE HCL 100 MG PO TABS: 100.0000 mg | ORAL_TABLET | Freq: Two times a day (BID) | ORAL | Status: DC

## 2015-08-19 MED ORDER — EPOETIN ALFA 10000 UNIT/ML IJ SOLN
10000.0000 [IU] | Freq: Once | INTRAMUSCULAR | Status: AC
  Administered 2015-08-19: 10000 [IU] via INTRAVENOUS
  Filled 2015-08-19: qty 1

## 2015-08-19 MED ORDER — MIDODRINE HCL 5 MG PO TABS: 5.0000 mg | ORAL_TABLET | Freq: Once | ORAL | Status: DC

## 2015-08-19 MED ORDER — CLINDAMYCIN PHOSPHATE 300 MG/50ML IV SOLN: 300.0000 mg | Freq: Three times a day (TID) | INTRAVENOUS | Status: DC

## 2015-08-19 MED ORDER — INSULIN ASPART 100 UNIT/ML ~~LOC~~ SOLN: 0.0000 [IU] | Freq: Three times a day (TID) | SUBCUTANEOUS | Status: DC

## 2015-08-19 MED ORDER — ALBUMIN HUMAN 25 % IV SOLN
12.5000 g | Freq: Once | INTRAVENOUS | Status: AC
  Administered 2015-08-19: 12.5 g via INTRAVENOUS
  Filled 2015-08-19: qty 50

## 2015-08-19 MED ORDER — DEXTROSE 5 % IV SOLN: 1.0000 g | INTRAVENOUS | Status: DC

## 2015-08-19 NOTE — Care Management (Addendum)
Plan for transfer to [facility] today after GI scope. Per Dr. Sabra Heck if scope will be later today patient may go on and transfer to [facility] for scope.

## 2015-08-19 NOTE — Progress Notes (Signed)
PER DR MARK MILLER,PT WILL NEED TO STAY INPATIENT FOR DIALYSIS SEVERAL DAYS  FOR FLUID OVERLOAD

## 2015-08-19 NOTE — Progress Notes (Signed)
Patient ID: [name], female   DOB: 09/25/1934, 81 y.o.   MRN: [id number] Patient ID: [name], female   DOB: 1934/03/24, 81 y.o.   MRN: [id number] [name] is a 81 y.o. female   SUBJECTIVE:  Patient recently hospitalized with bilateral pneumonia, thought due to staph aureus. Was treated with IV clindamycin, IV vancomycin and Rocephin with course complicated by rapid A. fib. Discharge chest x-ray showed substantial improvement. Within 48 hours of discharge the patient had nausea and vomiting with aspiration, hypothermia and hypotension. Patient transferred to the floor of the weekend, heart rate variable with A. Fib. Not eating well. Coughing still.  ______________________________________________________________________  ROS: Review of systems is unremarkable for any active cardiac,respiratory, GI, GU, hematologic, neurologic or psychiatric systems, 10 systems reviewed.  Marland Kitchen allopurinol  50 mg Oral Daily  . amiodarone  100 mg Oral BID  . aspirin EC  81 mg Oral Daily  . budesonide (PULMICORT) nebulizer solution  0.5 mg Nebulization BID  . calcium acetate  1,334 mg Oral TID WC  . cefTAZidime (FORTAZ)  IV  1 g Intravenous Q24H  . cholecalciferol  1,000 Units Oral Daily  . clindamycin (CLEOCIN) IV  300 mg Intravenous 3 times per day  . fluticasone  2 spray Each Nare QHS  . heparin  5,000 Units Subcutaneous 3 times per day  . insulin aspart  0-9 Units Subcutaneous TID WC  . ipratropium-albuterol  3 mL Nebulization Q6H  . metoprolol tartrate  25 mg Oral BID  . mirtazapine  7.5 mg Oral QHS  . multivitamin with minerals  1 tablet Oral Daily  . pantoprazole  40 mg Oral Q1200  . potassium chloride  20 mEq Oral BID  . sodium chloride  3 mL Intravenous Q12H  . tiZANidine  4 mg Oral BID   sodium chloride, sodium chloride, acetaminophen **OR** acetaminophen, alteplase, benzonatate, docusate sodium, heparin, lidocaine (PF), lidocaine-prilocaine, ondansetron **OR** ondansetron (ZOFRAN)  IV, pentafluoroprop-tetrafluoroeth, zolpidem   Past Medical History  Diagnosis Date  . Skin cancer     Resected from legs  . Renal insufficiency     Patient is on dialysis and normal days are M,W and F.  . Diabetes mellitus without complication     Patient takes Insulin  . ESRD (end stage renal disease)     Monday, wednesday, Friday DIalysis  . HTN (hypertension)   . Essential tremor   . OSA (obstructive sleep apnea)   . A-fib     not on anticoagulation  . Bilateral lower extremity edema   . Osteoarthritis   . Dysrhythmia   . CHF (congestive heart failure)   . Anemia     Past Surgical History  Procedure Laterality Date  . Abdominal hysterectomy    . Cholecystectomy    . Femur fracture surgery      left femur  . Tubal ligation    . Ankle fracture surgery      right ankle  . Neuroplasty / transposition median nerve at carpal tunnel bilateral    . Knee arthroplasty    . Peripheral vascular catheterization Left 08/06/2015    Procedure: A/V Shuntogram/Fistulagram;  Surgeon: Algernon Huxley, MD;  Location: [facility];  Service: Cardiovascular;  Laterality: Left;    PHYSICAL EXAM:  BP 112/58 mmHg  Pulse 100  Temp(Src) 98.3 F (36.8 C) (Oral)  Resp 20  Ht 5\' 1"  (1.549 m)  Wt 104.645 kg (230 lb 11.2 oz)  BMI 43.61 kg/m2  SpO2 99%  Wt Readings from Last 3 Encounters:  08/19/15 104.645 kg (230 lb 11.2 oz)  08/08/15 97.1 kg (214 lb 1.1 oz)           BP Readings from Last 3 Encounters:  08/19/15 112/58  08/11/15 140/57    Constitutional: NAD Neck: supple, no thyromegaly Respiratory: Rhonchi and wheezing throughout Cardiovascular: IRR, no murmur, no gallop Abdomen: soft, good BS, nontender Extremities: no edema Neuro: alert and oriented, no focal motor or sensory deficits  ASSESSMENT/PLAN:  Labs and imaging studies were reviewed  Complicated hospital-acquired pneumonia with sepsis syndrome-substantial right lung changes, speech evaluation ok, infectious  disease and intensivist, IV ceftazidime/clindamycin per ID, chest x-ray showed some improvement over the weekend Pleural effusion-post thoracentesis Recurrent nausea and vomiting - EGD today, spoke with GI, poor appetite   A. Fib- rate  controlled when not coughing Hypoglycemia-sliding scale insulin, no routine insulin ESRD-hemodialysis 3 times weekly Morbid obesity  [facility] tomorrow

## 2015-08-19 NOTE — Progress Notes (Signed)
HD START 

## 2015-08-19 NOTE — Progress Notes (Signed)
PRE HD   08/19/15 1000  Report  Report Received From KIM, RN  Vital Signs  Temp 97.5 F (36.4 C)  Temp Source Oral  Pulse Rate (!) 101  Pulse Rate Source Monitor  Resp (!) 22  BP 134/73 mmHg  BP Location Right Arm  BP Method Automatic  Patient Position (if appropriate) Lying  Oxygen Therapy  SpO2 100 %  O2 Device Nasal Cannula  O2 Flow Rate (L/min) 3 L/min  Pain Assessment  Pain Assessment No/denies pain  Pain Score 0  Dialysis Weight  Weight 104.5 kg (230 lb 6.1 oz)  Type of Weight Pre-Dialysis  Time-Out for Hemodialysis  What Procedure? HEMODIALYSIS  Pt Identifiers(min of two) First/Last Name;MRN/Account#  Correct Site? Yes  Correct Side? Yes  Correct Procedure? Yes  Consents Verified? Yes  Rad Studies Available? N/A  Safety Precautions Reviewed? Yes  Engineer, civil (consulting) Number [id number]  Station Number 1  UF/Alarm Test Passed  Conductivity: Meter 13.9  Conductivity: Machine  14.2  pH 7.4  Reverse Osmosis MAIN  Dialyzer Lot Number [id number]  Disposable Set Lot Number [id number]  Machine Temperature 98.6 F (37 C)  Musician and Audible Yes  Blood Lines Intact and Secured Yes  Pre Treatment Patient Checks  Vascular access used during treatment Fistula  Hepatitis B Surface Antigen Results Negative  Date Hepatitis B Surface Antigen Drawn 07/21/15  Hemodialysis Consent Verified Yes  Hemodialysis Standing Orders Initiated Yes  ECG (Telemetry) Monitor On Yes  Prime Ordered Normal Saline  Length of  DialysisTreatment -hour(s) 3 Hour(s)  Dialysis Treatment Comments GOAL TO REMOVE 3L IN 3 HOURS.  PT ALERT AND VS STABLE.  15G  NEEDLES INSERTED WITHOUT DIFFICULTY.    Dialyzer Optiflux 180 NR  Dialysate 2.5 Ca (3K)  Dialysis Anticoagulant None  Dialysate Flow Ordered 600  Blood Flow Rate Ordered 400 mL/min  Ultrafiltration Goal 3 Liters  Dialysis Blood Pressure Support Ordered Normal Saline  Fistula / Graft Left Forearm Arteriovenous fistula  No  Placement Date or Time found.   Placed prior to admission: Yes  Orientation: Left  Access Location: Forearm  Access Type: Arteriovenous fistula  Site Condition No complications  Fistula / Graft Assessment Present;Thrill;Bruit  Status Accessed  Needle Size 15  Drainage Description None

## 2015-08-19 NOTE — Progress Notes (Signed)
HD END 

## 2015-08-19 NOTE — Progress Notes (Signed)
HD START   08/19/15 1330  Report  Report Received From KIM, RN  Vital Signs  Temp 97.1 F (36.2 C)  Temp Source Oral  Pulse Rate 90  Pulse Rate Source Monitor  Resp (!) 25  BP 109/63 mmHg  BP Location Right Arm  BP Method Automatic  Patient Position (if appropriate) Lying  Dialysis Weight  Weight 104.3 kg (229 lb 15 oz)  Type of Weight Post-Dialysis  During Hemodialysis Assessment  Intra-Hemodialysis Comments HD TREATMENT END.  GOAL NOT MET.  PT ALERT AND VS STABLE.  BLOOD RETURNED AND 15G NEEDLES REMOVED PER POLICY.    Post-Hemodialysis Assessment  Rinseback Volume (mL) 250 mL  Dialyzer Clearance Lightly streaked  Duration of HD Treatment -hour(s) 3 hour(s)  Hemodialysis Intake (mL) 500 mL  UF Total -Machine (mL) 1046 mL  Net UF (mL) 546 mL  Tolerated HD Treatment Yes  Post-Hemodialysis Comments HD TREATMENT END.  GOAL NOT MET.  PT ALERT AND VS STABLE.  BLOOD RETURNED AND 15G NEEDLES REMOVED PER POLICY.    AVG/AVF Arterial Site Held (minutes) 10 minutes  AVG/AVF Venous Site Held (minutes) 10 minutes  Education / Care Plan  Hemodialysis Education Provided Yes  Documented Education in Clinical Pathway Yes  Fistula / Graft Left Forearm Arteriovenous fistula  No Placement Date or Time found.   Placed prior to admission: Yes  Orientation: Left  Access Location: Forearm  Access Type: Arteriovenous fistula  Site Condition No complications  Fistula / Graft Assessment Present;Thrill;Bruit  Status Deaccessed  Needle Size 15  Drainage Description None

## 2015-08-19 NOTE — Progress Notes (Signed)
PRE HD   08/19/15 1000  Neurological  Level of Consciousness Alert  Orientation Level Oriented X4  Respiratory  Respiratory Pattern Regular;Unlabored  Chest Assessment Chest expansion symmetrical  Bilateral Breath Sounds Diminished;Rhonchi  Cough Productive  Sputum Amount Small  Cardiac  Pulse Irregular  ECG Monitor Yes  Cardiac Rhythm Atrial fibrillation  Vascular  Edema Generalized  Generalized Edema +2  Integumentary  Integumentary (WDL) X  Skin Color Appropriate for ethnicity  Skin Condition Dry;Flaky  Skin Integrity Intact  Ecchymosis Location Arm  Ecchymosis Location Orientation Bilateral  Musculoskeletal  Musculoskeletal (WDL) X  Generalized Weakness Yes  Gastrointestinal  Bowel Sounds Assessment Active  GU Assessment  Genitourinary (WDL) X  Genitourinary Symptoms Other (Comment) (HD)  Psychosocial  Psychosocial (WDL) WDL

## 2015-08-19 NOTE — Progress Notes (Signed)
POST HD   08/19/15 1330  Neurological  Level of Consciousness Alert  Orientation Level Oriented X4  Respiratory  Respiratory Pattern Regular;Unlabored  Chest Assessment Chest expansion symmetrical  Bilateral Breath Sounds Diminished;Rhonchi  Cough Productive  Sputum Amount Small  Cardiac  Pulse Irregular  ECG Monitor Yes  Cardiac Rhythm Atrial fibrillation  Vascular  Edema Generalized  Generalized Edema +2  Integumentary  Integumentary (WDL) X  Skin Color Appropriate for ethnicity  Skin Condition Dry;Flaky  Skin Integrity Intact  Ecchymosis Location Arm  Ecchymosis Location Orientation Bilateral  Musculoskeletal  Musculoskeletal (WDL) X  Generalized Weakness Yes  Gastrointestinal  Bowel Sounds Assessment Active  GU Assessment  Genitourinary (WDL) X  Genitourinary Symptoms Other (Comment) (HD)  Psychosocial  Psychosocial (WDL) WDL

## 2015-08-19 NOTE — Progress Notes (Signed)
Pharmacy Consult for Ceftazidime Indication: HCAP  Allergies  Allergen Reactions  . Angiotensin Receptor Blockers     Other reaction(s): Other (See Comments) hyperkalemia  . Penicillins Rash    Patient Measurements: Height: 5\' 1"  (154.9 cm) Weight: 230 lb 11.2 oz (104.645 kg) IBW/kg (Calculated) : 47.8  Vital Signs: Temp: 98.3 F (36.8 C) (09/06 0717) Temp Source: Oral (09/06 0717) BP: 112/58 mmHg (09/06 0717) Pulse Rate: 100 (09/06 0717) Intake/Output from previous day: 09/05 0701 - 09/06 0700 In: 450 [IV Piggyback:450] Out: 3000  Intake/Output from this shift:    Labs:  Recent Labs  08/17/15 0530 08/17/15 1457 08/18/15 0630 08/19/15 0630  WBC 7.6 8.5 7.3 9.0  HGB 8.1* 8.5* 8.0* 8.8*  PLT 117* 127* 118* 133*  CREATININE 2.27*  --  2.18* 2.12*   Estimated Creatinine Clearance: 23.9 mL/min (by C-G formula based on Cr of 2.12). No results for input(s): VANCOTROUGH, VANCOPEAK, VANCORANDOM, GENTTROUGH, GENTPEAK, GENTRANDOM, TOBRATROUGH, TOBRAPEAK, TOBRARND, AMIKACINPEAK, AMIKACINTROU, AMIKACIN in the last 72 hours.   Microbiology: Recent Results (from the past 720 hour(s))  Blood culture (routine x 2)     Status: None   Collection Time: 08/02/15 11:59 AM  Result Value Ref Range Status   Specimen Description BLOOD LEFT ASSIST CONTROL  Final   Special Requests   Final    BOTTLES DRAWN AEROBIC AND ANAEROBIC  AER 6CC ANA [employer]   Culture NO GROWTH 5 DAYS  Final   Report Status 08/07/2015 FINAL  Final  Blood culture (routine x 2)     Status: None   Collection Time: 08/02/15 12:00 PM  Result Value Ref Range Status   Specimen Description BLOOD LEFT FOREARM  Final   Special Requests BOTTLES DRAWN AEROBIC AND ANAEROBIC  1CC  Final   Culture NO GROWTH 5 DAYS  Final   Report Status 08/07/2015 FINAL  Final  Culture, expectorated sputum-assessment     Status: None   Collection Time: 08/02/15  8:37 PM  Result Value Ref Range Status   Specimen Description SPUTUM  Final   Special Requests Normal  Final   Sputum evaluation THIS SPECIMEN IS ACCEPTABLE FOR SPUTUM CULTURE  Final   Report Status 08/04/2015 FINAL  Final  MRSA PCR Screening     Status: Abnormal   Collection Time: 08/02/15  8:37 PM  Result Value Ref Range Status   MRSA by PCR POSITIVE (A) NEGATIVE Final    Comment:        The GeneXpert MRSA Assay (FDA approved for NASAL specimens only), is one component of a comprehensive MRSA colonization surveillance program. It is not intended to diagnose MRSA infection nor to guide or monitor treatment for MRSA infections. CRITICAL RESULT CALLED TO, READ BACK BY AND VERIFIED WITH: MARCEL TURNER AT 2214 08/02/15.PMH   Culture, respiratory (NON-Expectorated)     Status: None   Collection Time: 08/02/15  8:37 PM  Result Value Ref Range Status   Specimen Description SPUTUM  Final   Special Requests Normal Reflexed from [id number]  Final   Gram Stain   Final    FAIR SPECIMEN - 70-80% WBCS FEW WBC SEEN MANY GRAM POSITIVE COCCI    Culture NORMAL UPPER RESPIRATORY FLORA  Final   Report Status 08/05/2015 FINAL  Final  Body fluid culture     Status: None   Collection Time: 08/04/15  2:51 PM  Result Value Ref Range Status   Specimen Description PLEURAL  Final   Special Requests NONE  Final   Gram Stain NO  WBC SEEN NO ORGANISMS SEEN   Final   Culture NO GROWTH 4 DAYS  Final   Report Status 08/08/2015 FINAL  Final  MRSA PCR Screening     Status: Abnormal   Collection Time: 08/14/15  5:43 AM  Result Value Ref Range Status   MRSA by PCR POSITIVE (A) NEGATIVE Final    Comment:        The GeneXpert MRSA Assay (FDA approved for NASAL specimens only), is one component of a comprehensive MRSA colonization surveillance program. It is not intended to diagnose MRSA infection nor to guide or monitor treatment for MRSA infections. CRITICAL RESULT CALLED TO, READ BACK BY AND VERIFIED WITH: BYRON HADDOCK AT [place] ON 08/14/15.Marland KitchenMarland KitchenMidland [facility] Hospital   Body fluid culture     Status:  None   Collection Time: 08/14/15 11:34 AM  Result Value Ref Range Status   Specimen Description PLEURAL  Final   Special Requests NONE  Final   Gram Stain NO WBC SEEN NO ORGANISMS SEEN   Final   Culture NO GROWTH 4 DAYS  Final   Report Status 08/18/2015 FINAL  Final  Culture, sputum-assessment     Status: None   Collection Time: 08/15/15  3:57 PM  Result Value Ref Range Status   Specimen Description EXPECTORATED SPUTUM  Final   Special Requests NONE  Final   Sputum evaluation THIS SPECIMEN IS ACCEPTABLE FOR SPUTUM CULTURE  Final   Report Status 08/15/2015 FINAL  Final  Culture, respiratory (NON-Expectorated)     Status: None (Preliminary result)   Collection Time: 08/15/15  3:57 PM  Result Value Ref Range Status   Specimen Description EXPECTORATED SPUTUM  Final   Special Requests NONE Reflexed from [id number]  Final   Gram Stain   Final    FEW WBC SEEN FEW GRAM POSITIVE COCCI IN PAIRS RARE GRAM NEGATIVE RODS GOOD SPECIMEN - 80-90% WBCS    Culture   Final    APPEARS TO BE NORMAL FLORA HOLDING FOR POSSIBLE PATHOGEN    Report Status PENDING  Incomplete     Assessment: 81 y/o M with ESRD on HD and PCN allergy ordered ceftazidime  Plan:  Will continue ceftazidime 1 g iv q 24 hours.   Larene Beach, PharmD 08/19/2015,7:39 AM

## 2015-08-19 NOTE — Discharge Summary (Addendum)
[name], is a 81 y.o. female  DOB Mar 16, 1934  MRN [id number].  Admission date:  08/14/2015  Admitting Physician  Juluis Mire, MD  Discharge Date:  08/21/2015    Admission Diagnosis  Hypoglycemia [E16.2] Aspiration pneumonia, unspecified aspiration pneumonia type [J69.0] Hypotension, unspecified hypotension type [I95.9]  Discharge Diagnoses    Aspiration pneumonia with respiratory failure Hospital acquired pneumonia Pleural effusion due to ESRD volume overload Hypotension Diabetes mellitus, insulin requiring Endstage renal disease, dialysis dependent with volume overload Atrial fibrillation, uncontrolled Morbid obesity due to OSA Chronic anemia Obstructive sleep apnea   Past Medical History  Diagnosis Date  . Skin cancer     Resected from legs  . Renal insufficiency     Patient is on dialysis and normal days are M,W and F.  . Diabetes mellitus without complication     Patient takes Insulin  . ESRD (end stage renal disease)     Monday, wednesday, Friday DIalysis  . HTN (hypertension)   . Essential tremor   . OSA (obstructive sleep apnea)   . A-fib     not on anticoagulation  . Bilateral lower extremity edema   . Osteoarthritis   . Dysrhythmia   . CHF (congestive heart failure)   . Anemia     Past Surgical History  Procedure Laterality Date  . Abdominal hysterectomy    . Cholecystectomy    . Femur fracture surgery      left femur  . Tubal ligation    . Ankle fracture surgery      right ankle  . Neuroplasty / transposition median nerve at carpal tunnel bilateral    . Knee arthroplasty    . Peripheral vascular catheterization Left 08/06/2015    Procedure: A/V Shuntogram/Fistulagram;  Surgeon: Algernon Huxley, MD;  Location: [facility];  Service: Cardiovascular;  Laterality: Left;       History of present illness and  Hospital Course:     Kindly see H&P for  history of present illness and admission details, please review complete Labs, Consult reports and Test reports for all details in brief  HPI  from the history and physical done on the day of admission    Hospital Course    Patient was admitted to the ICU and initially was on IV Levaphed for her blood pressure. Her episode occurred after vomiting with profound aspiration. She had significant new right lower lung infiltrate and underwent thoracentesis of 1200 cc. Blood culture was negative and sputum showed normal flora.With her current infection and persistent pneumonia she was treated per ID with IV ceftazidime and IV clindamycin with IV vancomycin per dialysis and a chest x-ray did show improvement. She did not tolerate regular insulin, sliding scale only. Due to persistent volume overload she underwent daily dialysis for 5 days with dramatic improvement in her nausea, edema and dyspnea. Her cough dramatically improved. She overall received 7 days of IV antibiotics. Her A. fib was uncontrolled and amiodarone was increased to twice a day with the  addition of metoprolol and controlled heart rate. She continues to mostly only eat soft foods and drink fluids, hopefully with her daily dialysis her nausea will improve and her nutritional status improve. Her pleural effusion, recurrent was thought due to hypervolemia from ESRD. Her blood pressure was maintained with midodrine 3 times a day. Due to poor by mouth intake her diabetes was only controlled with sliding scale insulin, not any basal dose. Close follow-up on electrolytes is important, particularly her hypokalemia.  Discharge Condition: Stable  Follow UP  Dr. Sabra Heck 3 weeks    Discharge Instructions  and  Discharge Medications  Aspirin 81 mg daily Sliding-scale insulin Metoprolol tartrate 25 mg twice a day Midodrine 5 mg 3 times daily Allopurinol 50 mg daily Tessalon 100 mg 3 times a day when necessary Calcium acetate 667 mg 2 tabs 3 times  a day Delsym cough syrup 5 cc twice a day Flonase 2 sprays daily Mucinex 600 mg twice a day Norco 5/325 every 6 when necessary pain DuoNeb nebulizer 3 cc 4 times a day Pantoprazole 40 mg daily Ranitidine 300 mg daily at bedtime Promethazine 25 g twice a day when necessary Sensipar 30 mg daily Vitamin D3 1000 units daily Klor con 74meq twice daily  Today   Subjective:   [name] todais stable with poor appetite, agreeable to [facility]. Objective:   Blood pressure 112/58, pulse 100, temperature 98.3 F (36.8 C), temperature source Oral, resp. rate 20, height 5\' 1"  (1.549 m), weight 104.645 kg (230 lb 11.2 oz), SpO2 99 %.    Total Time in preparing paper work, data evaluation and todays exam - 35 minutes  MILLER,MARK F. M.D on 08/21/2015 at 8:24 AM

## 2015-08-19 NOTE — Progress Notes (Signed)
[facility] INFECTIOUS DISEASE PROGRESS NOTE Date of Admission:  08/14/2015     ID: Nuriya Stuck is a 81 y.o. female with recurrent aspiration pneumonia.   Principal Problem:   Healthcare-associated pneumonia Active Problems:   Acute respiratory failure   Hypoglycemia   Hypotension   ESRD on hemodialysis   DM (diabetes mellitus)   Pleural effusion on right  Subjective: Had thoracentesis done with 1200 cc removed. Breathing has improved some  ROS  Eleven systems are reviewed and negative except per hpi  Medications:  Antibiotics Given (last 72 hours)    Date/Time Action Medication Dose Rate   08/17/15 0521 Given   clindamycin (CLEOCIN) IVPB 300 mg 300 mg 100 mL/hr   08/17/15 1208 Given   clindamycin (CLEOCIN) IVPB 300 mg 300 mg 100 mL/hr   08/17/15 2121 Given   clindamycin (CLEOCIN) IVPB 300 mg 300 mg 100 mL/hr   08/18/15 0440 Given   clindamycin (CLEOCIN) IVPB 300 mg 300 mg 100 mL/hr   08/18/15 1412 Given   clindamycin (CLEOCIN) IVPB 300 mg 300 mg 100 mL/hr   08/18/15 1424 Given   cefTAZidime (FORTAZ) 1 g in dextrose 5 % 50 mL IVPB 1 g 100 mL/hr   08/18/15 2055 Given   clindamycin (CLEOCIN) IVPB 300 mg 300 mg 100 mL/hr   08/19/15 0401 Given   clindamycin (CLEOCIN) IVPB 300 mg 300 mg 100 mL/hr   08/19/15 1524 Given   clindamycin (CLEOCIN) IVPB 300 mg 300 mg 100 mL/hr     . allopurinol  50 mg Oral Daily  . amiodarone  100 mg Oral BID  . aspirin EC  81 mg Oral Daily  . budesonide (PULMICORT) nebulizer solution  0.5 mg Nebulization BID  . calcium acetate  1,334 mg Oral TID WC  . cefTAZidime (FORTAZ)  IV  1 g Intravenous Q24H  . cholecalciferol  1,000 Units Oral Daily  . clindamycin (CLEOCIN) IV  300 mg Intravenous 3 times per day  . fluticasone  2 spray Each Nare QHS  . heparin  5,000 Units Subcutaneous 3 times per day  . insulin aspart  0-9 Units Subcutaneous TID WC  . ipratropium-albuterol  3 mL Nebulization Q6H  . metoprolol tartrate  25 mg Oral BID  .  mirtazapine  7.5 mg Oral QHS  . multivitamin with minerals  1 tablet Oral Daily  . pantoprazole  40 mg Oral Q1200  . potassium chloride  20 mEq Oral BID  . sodium chloride  3 mL Intravenous Q12H  . tiZANidine  4 mg Oral BID    Objective: Vital signs in last 24 hours: Temp:  [97.2 F (36.2 C)-98.4 F (36.9 C)] 97.2 F (36.2 C) (09/06 1409) Pulse Rate:  [72-143] 90 (09/06 1409) Resp:  [18-39] 21 (09/06 1409) BP: (73-197)/(40-182) 90/60 mmHg (09/06 1409) SpO2:  [94 %-100 %] 99 % (09/06 1105) Weight:  [104.3 kg (229 lb 15 oz)-104.645 kg (230 lb 11.2 oz)] 104.3 kg (229 lb 15 oz) (09/06 1330) Constitutional: oriented to person, place, and time. Obese, chronically ill apperaing HENT: [place]/AT, PERRLA, no scleral icterus Mouth/Throat: Oropharynx is clear and dry. No oropharyngeal exudate.  R neck TLC wnl, LUE AVF wnl Cardiovascular: Normal rate, regular rhythm and normal heart sounds.  Pulmonary/Chest: rhonchi throughout Neck = supple, no nuchal rigidity Abdominal: Soft. obese Bowel sounds are normal. exhibits no distension. There is no tenderness.  Lymphadenopathy: no cervical adenopathy. No axillary adenopathy Neurological: alert and oriented to person, place, and time.  Skin: Skin is warm  and dry. No rash noted. No erythema.  EXt 2+ LE edema Psychiatric: a normal mood and affect. behavior is normal.   Lab Results  Recent Labs  08/18/15 0630 08/19/15 0630  WBC 7.3 9.0  HGB 8.0* 8.8*  HCT 24.2* 26.8*  NA 136 140  K 3.2* 3.4*  CL 97* 101  CO2 34* 34*  BUN 5* <5*  CREATININE 2.18* 2.12*    Microbiology: Results for orders placed or performed during the hospital encounter of 08/14/15  MRSA PCR Screening     Status: Abnormal   Collection Time: 08/14/15  5:43 AM  Result Value Ref Range Status   MRSA by PCR POSITIVE (A) NEGATIVE Final    Comment:        The GeneXpert MRSA Assay (FDA approved for NASAL specimens only), is one component of a comprehensive MRSA  colonization surveillance program. It is not intended to diagnose MRSA infection nor to guide or monitor treatment for MRSA infections. CRITICAL RESULT CALLED TO, READ BACK BY AND VERIFIED WITH: BYRON HADDOCK AT [place] ON 08/14/15.Marland KitchenMarland KitchenBanner Boswell Medical Center   Body fluid culture     Status: None   Collection Time: 08/14/15 11:34 AM  Result Value Ref Range Status   Specimen Description PLEURAL  Final   Special Requests NONE  Final   Gram Stain NO WBC SEEN NO ORGANISMS SEEN   Final   Culture NO GROWTH 4 DAYS  Final   Report Status 08/18/2015 FINAL  Final  Culture, sputum-assessment     Status: None   Collection Time: 08/15/15  3:57 PM  Result Value Ref Range Status   Specimen Description EXPECTORATED SPUTUM  Final   Special Requests NONE  Final   Sputum evaluation THIS SPECIMEN IS ACCEPTABLE FOR SPUTUM CULTURE  Final   Report Status 08/15/2015 FINAL  Final  Culture, respiratory (NON-Expectorated)     Status: None (Preliminary result)   Collection Time: 08/15/15  3:57 PM  Result Value Ref Range Status   Specimen Description EXPECTORATED SPUTUM  Final   Special Requests NONE Reflexed from [id number]  Final   Gram Stain   Final    FEW WBC SEEN FEW GRAM POSITIVE COCCI IN PAIRS RARE GRAM NEGATIVE RODS GOOD SPECIMEN - 80-90% WBCS    Culture HOLDING FOR POSSIBLE PATHOGEN  Final   Report Status PENDING  Incomplete    Studies/Results: Dg Chest 2 View  08/19/2015   CLINICAL DATA:  Community-acquired pneumonia, history of COPD, dialysis distend in renal failure, diabetes, right pleural effusion.  EXAM: CHEST  2 VIEW  COMPARISON:  Chest x-ray of August 17, 2015  FINDINGS: The right-sided pleural effusion has increased in volume. The left lung is well-expanded. There is a tiny left pleural effusion. The cardiac silhouette remains enlarged. The pulmonary vascularity is prominent centrally. The pulmonary interstitial markings are slightly more conspicuous today. The right internal jugular venous catheter tip projects  over the midportion of the SVC. The observed bony thorax is unremarkable.  IMPRESSION: 1. Increasing right-sided pleural effusion. Right basilar atelectasis or pneumonia is suspected. Small left pleural effusion. 2. CHF with mild pulmonary interstitial edema.   Electronically Signed   By: Aldahir Litaker  [place] M.D.   On: 08/19/2015 09:55   Assessment/Plan: Dawnell Bryant is a 81 y.o. female readmitted with recurrent cough, sob, hypoglycemia, poor po intake and hypotension. She has CXR findings consistent with aspiration. Has already been on IV abx at facility with vanco, ceftraixone and clindamycin (she is penicillin allergic). Her wbc on admit was 11.6 and  she had hypothermia. [place] ngtd . Sputum cx being held for possible pathogen. S/p thoracentesis - 1200 cc - cx negative, transudative .I do not think this is failure of appropriate abx but rather recurrent aspiration.  Procalcitonin is low and decreasing.    Recommendations  Cont clinda and ceftazidime. If sputum cx is negative tomorrow would stop abx.  Speech and swallow for eval Thank you very much for the consult. Will follow with you.  [place], [place]   08/19/2015, 3:46 PM

## 2015-08-19 NOTE — Progress Notes (Signed)
[facility]  ROUNDING NOTE   Subjective:   Patient placed on hemodialysis for extra treatment today.  UF goal of 1 litre.  Patient hypotensive today, metoprolol dose increased by cardiology  Objective:  Vital signs in last 24 hours:  Temp:  [97.5 F (36.4 C)-98.4 F (36.9 C)] 97.5 F (36.4 C) (09/06 1000) Pulse Rate:  [55-143] 72 (09/06 1130) Resp:  [18-39] 21 (09/06 1130) BP: (73-197)/(40-182) 73/40 mmHg (09/06 1130) SpO2:  [94 %-100 %] 99 % (09/06 1105) Weight:  [104.2 kg (229 lb 11.5 oz)-104.645 kg (230 lb 11.2 oz)] 104.5 kg (230 lb 6.1 oz) (09/06 1000)  Weight change: -0.8 kg (-1 lb 12.2 oz) Filed Weights   08/18/15 1305 08/19/15 0500 08/19/15 1000  Weight: 104.2 kg (229 lb 11.5 oz) 104.645 kg (230 lb 11.2 oz) 104.5 kg (230 lb 6.1 oz)    Intake/Output: I/O last 3 completed shifts: In: 450 [IV Piggyback:450] Out: 3000 [Other:3000]   Intake/Output this shift:     Physical Exam: General: Chronically ill appearing  Head: Normocephalic, atraumatic. Moist oral mucosal membranes  Eyes: Anicteric, PERRL  Neck: Supple, trachea midline  Lungs:  Clear bilaterally  Heart: Regular rate and rhythm  Abdomen:  Soft, nontender, BS present  Extremities: 1+ peripheral edema.  Neurologic: Nonfocal, moving all four extremities  Skin: No lesions  Access: LUE AVF    Basic Metabolic Panel:  Recent Labs Lab 08/15/15 0517 08/15/15 0958 08/16/15 0449 08/17/15 0530 08/18/15 0630 08/19/15 0630  NA 134*  --  136 136 136 140  K 3.8  --  3.5 3.5 3.2* 3.4*  CL 95*  --  97* 97* 97* 101  CO2 28  --  33* 31 34* 34*  GLUCOSE 144*  --  160* 129* 149* 160*  BUN 10  --  7 6 5* <5*  CREATININE 3.31*  --  2.58* 2.27* 2.18* 2.12*  CALCIUM 7.9*  --  7.8* 8.0* 7.9* 8.4*  PHOS  --  2.6  --   --  1.6*  --     Liver Function Tests:  Recent Labs Lab 08/15/15 0517 08/16/15 0449 08/17/15 0530 08/18/15 0630 08/19/15 0630  AST 25 22 19 19 21   ALT 13* 12* 10* 9* 11*   ALKPHOS 126 107 118 105 113  BILITOT 1.1 0.7 0.6 0.5 0.4  PROT 6.0* 5.3* 5.4* 5.3* 5.7*  ALBUMIN 2.6* 2.3* 2.2* 2.2* 2.3*   No results for input(s): LIPASE, AMYLASE in the last 168 hours. No results for input(s): AMMONIA in the last 168 hours.  CBC:  Recent Labs Lab 08/16/15 0449 08/17/15 0530 08/17/15 1457 08/18/15 0630 08/19/15 0630  WBC 9.2 7.6 8.5 7.3 9.0  NEUTROABS 5.8 4.8  --  4.8 5.3  HGB 8.3* 8.1* 8.5* 8.0* 8.8*  HCT 24.9* 24.8* 25.8* 24.2* 26.8*  MCV 104.3* 103.1* 103.3* 103.8* 103.8*  PLT 122* 117* 127* 118* 133*    Cardiac Enzymes:  Recent Labs Lab 08/14/15 0114  TROPONINI 0.03    BNP: Invalid input(s): POCBNP  CBG:  Recent Labs Lab 08/18/15 0743 08/18/15 1343 08/18/15 1558 08/18/15 1940 08/19/15 0714  GLUCAP 140* 148* 159* 208* 146*    Microbiology: Results for orders placed or performed during the hospital encounter of 08/14/15  MRSA PCR Screening     Status: Abnormal   Collection Time: 08/14/15  5:43 AM  Result Value Ref Range Status   MRSA by PCR POSITIVE (A) NEGATIVE Final    Comment:  The GeneXpert MRSA Assay (FDA approved for NASAL specimens only), is one component of a comprehensive MRSA colonization surveillance program. It is not intended to diagnose MRSA infection nor to guide or monitor treatment for MRSA infections. CRITICAL RESULT CALLED TO, READ BACK BY AND VERIFIED WITH: BYRON HADDOCK AT [place] ON 08/14/15.Marland KitchenMarland KitchenA[facility]   Body fluid culture     Status: None   Collection Time: 08/14/15 11:34 AM  Result Value Ref Range Status   Specimen Description PLEURAL  Final   Special Requests NONE  Final   Gram Stain NO WBC SEEN NO ORGANISMS SEEN   Final   Culture NO GROWTH 4 DAYS  Final   Report Status 08/18/2015 FINAL  Final  Culture, sputum-assessment     Status: None   Collection Time: 08/15/15  3:57 PM  Result Value Ref Range Status   Specimen Description EXPECTORATED SPUTUM  Final   Special Requests NONE  Final   Sputum  evaluation THIS SPECIMEN IS ACCEPTABLE FOR SPUTUM CULTURE  Final   Report Status 08/15/2015 FINAL  Final  Culture, respiratory (NON-Expectorated)     Status: None (Preliminary result)   Collection Time: 08/15/15  3:57 PM  Result Value Ref Range Status   Specimen Description EXPECTORATED SPUTUM  Final   Special Requests NONE Reflexed from [id number]  Final   Gram Stain   Final    FEW WBC SEEN FEW GRAM POSITIVE COCCI IN PAIRS RARE GRAM NEGATIVE RODS GOOD SPECIMEN - 80-90% WBCS    Culture   Final    APPEARS TO BE NORMAL FLORA HOLDING FOR POSSIBLE PATHOGEN    Report Status PENDING  Incomplete    Coagulation Studies: No results for input(s): LABPROT, INR in the last 72 hours.  Urinalysis: No results for input(s): COLORURINE, LABSPEC, PHURINE, GLUCOSEU, HGBUR, BILIRUBINUR, KETONESUR, PROTEINUR, UROBILINOGEN, NITRITE, LEUKOCYTESUR in the last 72 hours.  Invalid input(s): APPERANCEUR    Imaging: Dg Chest 2 View  08/19/2015   CLINICAL DATA:  Community-acquired pneumonia, history of COPD, dialysis distend in renal failure, diabetes, right pleural effusion.  EXAM: CHEST  2 VIEW  COMPARISON:  Chest x-ray of August 17, 2015  FINDINGS: The right-sided pleural effusion has increased in volume. The left lung is well-expanded. There is a tiny left pleural effusion. The cardiac silhouette remains enlarged. The pulmonary vascularity is prominent centrally. The pulmonary interstitial markings are slightly more conspicuous today. The right internal jugular venous catheter tip projects over the midportion of the SVC. The observed bony thorax is unremarkable.  IMPRESSION: 1. Increasing right-sided pleural effusion. Right basilar atelectasis or pneumonia is suspected. Small left pleural effusion. 2. CHF with mild pulmonary interstitial edema.   Electronically Signed   By: David  Martinique M.D.   On: 08/19/2015 09:55     Medications:     . albumin human  12.5 g Intravenous Once  . allopurinol  50 mg Oral  Daily  . amiodarone  100 mg Oral BID  . aspirin EC  81 mg Oral Daily  . budesonide (PULMICORT) nebulizer solution  0.5 mg Nebulization BID  . calcium acetate  1,334 mg Oral TID WC  . cefTAZidime (FORTAZ)  IV  1 g Intravenous Q24H  . cholecalciferol  1,000 Units Oral Daily  . clindamycin (CLEOCIN) IV  300 mg Intravenous 3 times per day  . epoetin (EPOGEN/PROCRIT) injection  10,000 Units Intravenous Once  . fluticasone  2 spray Each Nare QHS  . heparin  5,000 Units Subcutaneous 3 times per day  . insulin aspart  0-9 Units Subcutaneous TID WC  . ipratropium-albuterol  3 mL Nebulization Q6H  . metoprolol tartrate  25 mg Oral BID  . mirtazapine  7.5 mg Oral QHS  . multivitamin with minerals  1 tablet Oral Daily  . pantoprazole  40 mg Oral Q1200  . potassium chloride  20 mEq Oral BID  . sodium chloride  3 mL Intravenous Q12H  . tiZANidine  4 mg Oral BID   sodium chloride, sodium chloride, acetaminophen **OR** acetaminophen, alteplase, benzonatate, docusate sodium, heparin, lidocaine (PF), lidocaine-prilocaine, ondansetron **OR** ondansetron (ZOFRAN) IV, pentafluoroprop-tetrafluoroeth, zolpidem  Assessment/ Plan:  81 y.o. female Diabetes mellitus type 2 x 20 years, ESRD ON HD, HTN ,Psoriasis, Sleep apnea, CPAP, Recurrent vertigo, Essential tremor, Bilateral knee replacement, ACD, Secondary hyperparathyroidism, Cholcystecomy, Hysterectomy, Bilateral Tubal ligation, Carpal tunnel release, admitted on 08/14/2015 for Hypoglycemia [E16.2] Aspiration pneumonia, unspecified aspiration pneumonia type [J69.0] Hypotension, unspecified hypotension type [I95.9]  1.  ESRD on HD MWF: extra hemodialysis treatment last two daysto assist with respiratory status. Dialysis last two days with total ultrafiltration of 8.5 litres.  -  UF goal of 1 litre. Will provide midodrine prior to treatment. Albumin IV - outpatient EDW of 92.5 kg. Will continue daily dialysis at this point.   2.  Bacterial pneumonia/aspiration  pneumonia/pleural effusion:  Had thoracentesis 08/14/15. Wbc has improved. Continues productive cough.  - ID input: clindamycin and ceftazidime  3.  Anemia of CKD:  hgb 8.8,  continue epogen 10000 units IV with HD on MWF.   4.  SHPTH:  Phos 1.6 - on calcium acetate and sevelamer. discontinued sevelamer.  - hold cinacalcet - PTH 43 on 8/22  5.  Hypotension:  midodine before treatment.    LOS: [place], [place] 9/6/201611:37 AM

## 2015-08-19 NOTE — Care Management (Signed)
Dr. Abigail Butts nephrology per Maudie Mercury RN said patient was in fluid overload and will need daily dialysis under improved. [facility] cannot take patient on daily dialysis. I have notified Seth Bake with [facility] that discharge/transfer cancellation.

## 2015-08-20 LAB — RENAL FUNCTION PANEL
Albumin: 2.4 g/dL — ABNORMAL LOW (ref 3.5–5.0)
Anion gap: 9 (ref 5–15)
CHLORIDE: 98 mmol/L — AB (ref 101–111)
CO2: 31 mmol/L (ref 22–32)
Calcium: 8.5 mg/dL — ABNORMAL LOW (ref 8.9–10.3)
Creatinine, Ser: 2.2 mg/dL — ABNORMAL HIGH (ref 0.44–1.00)
GFR calc Af Amer: 23 mL/min — ABNORMAL LOW (ref >60)
GFR calc non Af Amer: 20 mL/min — ABNORMAL LOW (ref >60)
Glucose, Bld: 150 mg/dL — ABNORMAL HIGH (ref 65–99)
POTASSIUM: 3.9 mmol/L (ref 3.5–5.1)
Phosphorus: 1.4 mg/dL — ABNORMAL LOW (ref 2.5–4.6)
Sodium: 138 mmol/L (ref 135–145)

## 2015-08-20 LAB — GLUCOSE, CAPILLARY
GLUCOSE-CAPILLARY: 201 mg/dL — AB (ref 65–99)
GLUCOSE-CAPILLARY: 176 mg/dL — AB (ref 65–99)
Glucose-Capillary: 125 mg/dL — ABNORMAL HIGH (ref 65–99)

## 2015-08-20 LAB — CBC
HEMATOCRIT: 27.5 % — AB (ref 35.0–47.0)
Hemoglobin: 9 g/dL — ABNORMAL LOW (ref 12.0–16.0)
MCH: 34.4 pg — AB (ref 26.0–34.0)
MCHC: 32.8 g/dL (ref 32.0–36.0)
MCV: 105 fL — AB (ref 80.0–100.0)
Platelets: 142 10*3/uL — ABNORMAL LOW (ref 150–440)
RBC: 2.61 MIL/uL — ABNORMAL LOW (ref 3.80–5.20)
RDW: 15.8 % — AB (ref 11.5–14.5)
WBC: 10.7 10*3/uL (ref 3.6–11.0)

## 2015-08-20 LAB — CULTURE, RESPIRATORY W GRAM STAIN: Culture: NORMAL

## 2015-08-20 LAB — CULTURE, RESPIRATORY

## 2015-08-20 MED ORDER — MUPIROCIN 2 % EX OINT
TOPICAL_OINTMENT | Freq: Two times a day (BID) | CUTANEOUS | Status: DC
  Administered 2015-08-20 – 2015-08-21 (×3): via NASAL
  Filled 2015-08-20: qty 22

## 2015-08-20 MED ORDER — MUPIROCIN 2 % EX OINT: TOPICAL_OINTMENT | Freq: Two times a day (BID) | CUTANEOUS | Status: DC

## 2015-08-20 MED ORDER — MIDODRINE HCL 5 MG PO TABS: 5.0000 mg | ORAL_TABLET | Freq: Three times a day (TID) | ORAL | Status: DC

## 2015-08-20 MED ORDER — ALBUMIN HUMAN 25 % IV SOLN
25.0000 g | Freq: Once | INTRAVENOUS | Status: AC
  Administered 2015-08-20: 25 g via INTRAVENOUS
  Filled 2015-08-20: qty 100

## 2015-08-20 MED ORDER — MIDODRINE HCL 5 MG PO TABS
5.0000 mg | ORAL_TABLET | Freq: Three times a day (TID) | ORAL | Status: DC
  Administered 2015-08-20 – 2015-08-21 (×4): 5 mg via ORAL
  Filled 2015-08-20 (×4): qty 1

## 2015-08-20 MED ORDER — POTASSIUM CHLORIDE 20 MEQ PO PACK: 20.0000 meq | PACK | Freq: Two times a day (BID) | ORAL | Status: DC

## 2015-08-20 NOTE — Progress Notes (Signed)
[facility]  ROUNDING NOTE   Subjective:   Seen and examined on hemodialysis. Tolerating treatment well. UF goal of 3 litres Given midodrine and albumin for treatment. Hemodialysis yesterday with only 0.5kg UF  Objective:  Vital signs in last 24 hours:  Temp:  [97.1 F (36.2 C)-97.6 F (36.4 C)] 97.6 F (36.4 C) (09/07 0845) Pulse Rate:  [72-115] 115 (09/07 1030) Resp:  [20-32] 29 (09/07 1030) BP: (73-134)/(40-86) 134/68 mmHg (09/07 1030) SpO2:  [98 %-100 %] 100 % (09/07 0845) FiO2 (%):  [32 %] 32 % (09/06 1435) Weight:  [104.3 kg (229 lb 15 oz)-109.1 kg (240 lb 8.4 oz)] 109.1 kg (240 lb 8.4 oz) (09/07 0845)  Weight change: -2.8 kg (-6 lb 2.8 oz) Filed Weights   08/19/15 1330 08/20/15 0500 08/20/15 0845  Weight: 104.3 kg (229 lb 15 oz) 107.6 kg (237 lb 3.4 oz) 109.1 kg (240 lb 8.4 oz)    Intake/Output: I/O last 3 completed shifts: In: 300 [IV Piggyback:300] Out: 546 [Other:546]   Intake/Output this shift:     Physical Exam: General: Chronically ill appearing  Head: Normocephalic, atraumatic. Moist oral mucosal membranes  Eyes: Anicteric, PERRL  Neck: Supple, trachea midline  Lungs:  Clear bilaterally  Heart: Regular rate and rhythm  Abdomen:  Soft, nontender, BS present  Extremities: 1+ peripheral edema.  Neurologic: Nonfocal, moving all four extremities  Skin: No lesions  Access: LUE AVF    Basic Metabolic Panel:  Recent Labs Lab 08/15/15 0517 08/15/15 0958 08/16/15 0449 08/17/15 0530 08/18/15 0630 08/19/15 0630  NA 134*  --  136 136 136 140  K 3.8  --  3.5 3.5 3.2* 3.4*  CL 95*  --  97* 97* 97* 101  CO2 28  --  33* 31 34* 34*  GLUCOSE 144*  --  160* 129* 149* 160*  BUN 10  --  7 6 5* <5*  CREATININE 3.31*  --  2.58* 2.27* 2.18* 2.12*  CALCIUM 7.9*  --  7.8* 8.0* 7.9* 8.4*  PHOS  --  2.6  --   --  1.6*  --     Liver Function Tests:  Recent Labs Lab 08/15/15 0517 08/16/15 0449 08/17/15 0530 08/18/15 0630 08/19/15 0630  AST  25 22 19 19 21   ALT 13* 12* 10* 9* 11*  ALKPHOS 126 107 118 105 113  BILITOT 1.1 0.7 0.6 0.5 0.4  PROT 6.0* 5.3* 5.4* 5.3* 5.7*  ALBUMIN 2.6* 2.3* 2.2* 2.2* 2.3*   No results for input(s): LIPASE, AMYLASE in the last 168 hours. No results for input(s): AMMONIA in the last 168 hours.  CBC:  Recent Labs Lab 08/16/15 0449 08/17/15 0530 08/17/15 1457 08/18/15 0630 08/19/15 0630  WBC 9.2 7.6 8.5 7.3 9.0  NEUTROABS 5.8 4.8  --  4.8 5.3  HGB 8.3* 8.1* 8.5* 8.0* 8.8*  HCT 24.9* 24.8* 25.8* 24.2* 26.8*  MCV 104.3* 103.1* 103.3* 103.8* 103.8*  PLT 122* 117* 127* 118* 133*    Cardiac Enzymes:  Recent Labs Lab 08/14/15 0114  TROPONINI 0.03    BNP: Invalid input(s): POCBNP  CBG:  Recent Labs Lab 08/18/15 1940 08/19/15 0714 08/19/15 1403 08/19/15 1612 08/19/15 2047  GLUCAP 208* 146* 175* 121* 250*    Microbiology: Results for orders placed or performed during the hospital encounter of 08/14/15  MRSA PCR Screening     Status: Abnormal   Collection Time: 08/14/15  5:43 AM  Result Value Ref Range Status   MRSA by PCR POSITIVE (A) NEGATIVE  Final    Comment:        The GeneXpert MRSA Assay (FDA approved for NASAL specimens only), is one component of a comprehensive MRSA colonization surveillance program. It is not intended to diagnose MRSA infection nor to guide or monitor treatment for MRSA infections. CRITICAL RESULT CALLED TO, READ BACK BY AND VERIFIED WITH: BYRON HADDOCK AT [place] ON 08/14/15.Marland KitchenMarland KitchenG. V. ([facility]   Body fluid culture     Status: None   Collection Time: 08/14/15 11:34 AM  Result Value Ref Range Status   Specimen Description PLEURAL  Final   Special Requests NONE  Final   Gram Stain NO WBC SEEN NO ORGANISMS SEEN   Final   Culture NO GROWTH 4 DAYS  Final   Report Status 08/18/2015 FINAL  Final  Culture, sputum-assessment     Status: None   Collection Time: 08/15/15  3:57 PM  Result Value Ref Range Status   Specimen Description EXPECTORATED SPUTUM  Final    Special Requests NONE  Final   Sputum evaluation THIS SPECIMEN IS ACCEPTABLE FOR SPUTUM CULTURE  Final   Report Status 08/15/2015 FINAL  Final  Culture, respiratory (NON-Expectorated)     Status: None (Preliminary result)   Collection Time: 08/15/15  3:57 PM  Result Value Ref Range Status   Specimen Description EXPECTORATED SPUTUM  Final   Special Requests NONE Reflexed from [id number]  Final   Gram Stain   Final    FEW WBC SEEN FEW GRAM POSITIVE COCCI IN PAIRS RARE GRAM NEGATIVE RODS GOOD SPECIMEN - 80-90% WBCS    Culture HOLDING FOR POSSIBLE PATHOGEN  Final   Report Status PENDING  Incomplete    Coagulation Studies: No results for input(s): LABPROT, INR in the last 72 hours.  Urinalysis: No results for input(s): COLORURINE, LABSPEC, PHURINE, GLUCOSEU, HGBUR, BILIRUBINUR, KETONESUR, PROTEINUR, UROBILINOGEN, NITRITE, LEUKOCYTESUR in the last 72 hours.  Invalid input(s): APPERANCEUR    Imaging: Dg Chest 2 View  08/19/2015   CLINICAL DATA:  Community-acquired pneumonia, history of COPD, dialysis distend in renal failure, diabetes, right pleural effusion.  EXAM: CHEST  2 VIEW  COMPARISON:  Chest x-ray of August 17, 2015  FINDINGS: The right-sided pleural effusion has increased in volume. The left lung is well-expanded. There is a tiny left pleural effusion. The cardiac silhouette remains enlarged. The pulmonary vascularity is prominent centrally. The pulmonary interstitial markings are slightly more conspicuous today. The right internal jugular venous catheter tip projects over the midportion of the SVC. The observed bony thorax is unremarkable.  IMPRESSION: 1. Increasing right-sided pleural effusion. Right basilar atelectasis or pneumonia is suspected. Small left pleural effusion. 2. CHF with mild pulmonary interstitial edema.   Electronically Signed   By: David  Martinique M.D.   On: 08/19/2015 09:55     Medications:     . allopurinol  50 mg Oral Daily  . amiodarone  100 mg Oral BID   . aspirin EC  81 mg Oral Daily  . calcium acetate  1,334 mg Oral TID WC  . cefTAZidime (FORTAZ)  IV  1 g Intravenous Q24H  . cholecalciferol  1,000 Units Oral Daily  . clindamycin (CLEOCIN) IV  300 mg Intravenous 3 times per day  . fluticasone  2 spray Each Nare QHS  . heparin  5,000 Units Subcutaneous 3 times per day  . insulin aspart  0-9 Units Subcutaneous TID WC  . ipratropium-albuterol  3 mL Nebulization Q6H  . metoprolol tartrate  25 mg Oral BID  . midodrine  5 mg  Oral TID WC  . mirtazapine  7.5 mg Oral QHS  . multivitamin with minerals  1 tablet Oral Daily  . mupirocin ointment   Nasal BID  . pantoprazole  40 mg Oral Q1200  . potassium chloride  20 mEq Oral BID  . sodium chloride  3 mL Intravenous Q12H  . tiZANidine  4 mg Oral BID   sodium chloride, sodium chloride, acetaminophen **OR** acetaminophen, alteplase, benzonatate, docusate sodium, heparin, lidocaine (PF), lidocaine-prilocaine, ondansetron **OR** ondansetron (ZOFRAN) IV, pentafluoroprop-tetrafluoroeth, zolpidem  Assessment/ Plan:  81 y.o. female Diabetes mellitus type 2 x 20 years, ESRD ON HD, HTN ,Psoriasis, Sleep apnea, CPAP, Recurrent vertigo, Essential tremor, Bilateral knee replacement, ACD, Secondary hyperparathyroidism, Cholcystecomy, Hysterectomy, Bilateral Tubal ligation, Carpal tunnel release, admitted on 08/14/2015 for Hypoglycemia [E16.2] Aspiration pneumonia, unspecified aspiration pneumonia type [J69.0] Hypotension, unspecified hypotension type [I95.9]  1.  ESRD on HD MWF: extra hemodialysis treatment last two daysto assist with respiratory status. Dialysis last two days with total ultrafiltration of 9 litres.  -  UF goal of 1 litre. Will provide midodrine prior to treatment. Albumin IV - outpatient EDW of 92.5 kg. Will continue daily dialysis at this point.   2.  Bacterial pneumonia/aspiration pneumonia/pleural effusion:  Had thoracentesis 08/14/15. Wbc has improved. Continues productive cough.  -  appreciate ID input: clindamycin and ceftazidime  3.  Anemia of CKD:  hgb 8.8,  continue epogen 10000 units IV with HD on MWF.   4.  SHPTH:  Phos 1.6 - on calcium acetate and sevelamer. discontinued sevelamer.  - hold cinacalcet - PTH 43 on 8/22  5.  Hypotension:  midodine before treatment.    LOS: [place], [place] 9/7/201611:13 AM

## 2015-08-20 NOTE — Progress Notes (Signed)
PRE HD    08/20/15 0845  Report  Report Received From Loletha Grayer, RN  Vital Signs  Temp 97.6 F (36.4 C)  Temp Source Oral  Pulse Rate 100  Pulse Rate Source Monitor  Resp (!) 30  BP 112/68 mmHg  BP Location Right Arm  BP Method Automatic  Patient Position (if appropriate) Lying  Oxygen Therapy  SpO2 100 %  O2 Device Nasal Cannula  O2 Flow Rate (L/min) 3 L/min  Pain Assessment  Pain Assessment No/denies pain  Pain Score 0  Dialysis Weight  Weight 109.1 kg (240 lb 8.4 oz)  Type of Weight Pre-Dialysis  Time-Out for Hemodialysis  What Procedure? HEMODIALYSIS  Pt Identifiers(min of two) First/Last Name;MRN/Account#  Correct Site? Yes  Correct Side? Yes  Correct Procedure? Yes  Consents Verified? Yes  Rad Studies Available? N/A  Safety Precautions Reviewed? Yes  Engineer, civil (consulting) Number [phone]  Station Number 4  UF/Alarm Test Passed  Conductivity: Meter 13.9  Conductivity: Machine  13.9  pH 7.4  Reverse Osmosis MAIN  Dialyzer Lot Number [id number]  Disposable Set Lot Number [id number]  Machine Temperature 98.6 F (37 C)  Musician and Audible Yes  Blood Lines Intact and Secured Yes  Pre Treatment Patient Checks  Vascular access used during treatment Fistula  Hepatitis B Surface Antigen Results Negative  Date Hepatitis B Surface Antigen Drawn 07/21/15  Isolation Initiated Yes  Hemodialysis Consent Verified Yes  Hemodialysis Standing Orders Initiated Yes  ECG (Telemetry) Monitor On Yes  Prime Ordered Normal Saline  Length of  DialysisTreatment -hour(s) 3 Hour(s)  Dialysis Treatment Comments GOAL TO REMOVE 3L IN 3 HOURS.  PT ALERT AND VS STABLE.  15G NEEDLES INSERTED WITHOUT DIFFICULTY.  PT  BS SUPPORT STARTED AT THE BEGINING OF TREATMENT.   Dialyzer Optiflux 180 NR  Dialysate 2.5 Ca (3K)  Dialysis Anticoagulant None  Dialysate Flow Ordered 600  Blood Flow Rate Ordered 400 mL/min  Ultrafiltration Goal 3 Liters  Pre Treatment Labs Renal panel;CBC   Dialysis Blood Pressure Support Ordered Normal Saline  Fistula / Graft Left Forearm Arteriovenous fistula  No Placement Date or Time found.   Placed prior to admission: Yes  Orientation: Left  Access Location: Forearm  Access Type: Arteriovenous fistula  Site Condition No complications  Fistula / Graft Assessment Present;Thrill;Bruit  Status Accessed  Needle Size 15  Drainage Description None

## 2015-08-20 NOTE — Care Management Important Message (Signed)
Important Message  Patient Details  Name: [name] MRN: [id number] Date of Birth: September 12, 1934   Medicare Important Message Given:  Yes-fourth notification given    Marshell Garfinkel, RN 08/20/2015, 10:45 AM

## 2015-08-20 NOTE — Progress Notes (Signed)
PT Cancellation Note  Patient Details Name: [name] MRN: [id number] DOB: 08/27/1934   Cancelled Treatment:    Reason Eval/Treat Not Completed: Patient at procedure or test/unavailable.  Has HD MWF and will try again tomorrow.   Ramond Dial 08/20/2015, 11:37 AM   Mee Hives, PT MS Acute Rehab Dept. Number: [phone] [id number] and [place] [phone]

## 2015-08-20 NOTE — Progress Notes (Signed)
Patient ID: [name], female   DOB: 03/19/1934, 81 y.o.   MRN: [id number] Patient ID: Lonya Johannesen, female   DOB: Jul 24, 1934, 81 y.o.   MRN: [id number] Patient ID: Nikkia Devoss, female   DOB: 07-24-1934, 81 y.o.   MRN: [id number] Sharnita Bogucki is a 81 y.o. female   SUBJECTIVE:  Patient recently hospitalized with bilateral pneumonia, thought due to staph aureus. Was treated with IV clindamycin, IV vancomycin and Rocephin with course complicated by rapid A. fib. Discharge chest x-ray showed substantial improvement. Within 48 hours of discharge the patient had nausea and vomiting with aspiration, hypothermia and hypotension. Cough is dramatically better, breathing better, less edema with daily dialysis.  ______________________________________________________________________  ROS: Review of systems is unremarkable for any active cardiac,respiratory, GI, GU, hematologic, neurologic or psychiatric systems, 10 systems reviewed.  Marland Kitchen allopurinol  50 mg Oral Daily  . amiodarone  100 mg Oral BID  . aspirin EC  81 mg Oral Daily  . calcium acetate  1,334 mg Oral TID WC  . cefTAZidime (FORTAZ)  IV  1 g Intravenous Q24H  . cholecalciferol  1,000 Units Oral Daily  . clindamycin (CLEOCIN) IV  300 mg Intravenous 3 times per day  . fluticasone  2 spray Each Nare QHS  . heparin  5,000 Units Subcutaneous 3 times per day  . insulin aspart  0-9 Units Subcutaneous TID WC  . ipratropium-albuterol  3 mL Nebulization Q6H  . metoprolol tartrate  25 mg Oral BID  . mirtazapine  7.5 mg Oral QHS  . multivitamin with minerals  1 tablet Oral Daily  . mupirocin ointment   Nasal BID  . pantoprazole  40 mg Oral Q1200  . potassium chloride  20 mEq Oral BID  . sodium chloride  3 mL Intravenous Q12H  . tiZANidine  4 mg Oral BID   sodium chloride, sodium chloride, acetaminophen **OR** acetaminophen, alteplase, benzonatate, docusate sodium, heparin, lidocaine (PF), lidocaine-prilocaine, ondansetron **OR** ondansetron  (ZOFRAN) IV, pentafluoroprop-tetrafluoroeth, zolpidem   Past Medical History  Diagnosis Date  . Skin cancer     Resected from legs  . Renal insufficiency     Patient is on dialysis and normal days are M,W and F.  . Diabetes mellitus without complication     Patient takes Insulin  . ESRD (end stage renal disease)     Monday, wednesday, Friday DIalysis  . HTN (hypertension)   . Essential tremor   . OSA (obstructive sleep apnea)   . A-fib     not on anticoagulation  . Bilateral lower extremity edema   . Osteoarthritis   . Dysrhythmia   . CHF (congestive heart failure)   . Anemia     Past Surgical History  Procedure Laterality Date  . Abdominal hysterectomy    . Cholecystectomy    . Femur fracture surgery      left femur  . Tubal ligation    . Ankle fracture surgery      right ankle  . Neuroplasty / transposition median nerve at carpal tunnel bilateral    . Knee arthroplasty    . Peripheral vascular catheterization Left 08/06/2015    Procedure: A/V Shuntogram/Fistulagram;  Surgeon: Algernon Huxley, MD;  Location: [facility];  Service: Cardiovascular;  Laterality: Left;    PHYSICAL EXAM:  BP 99/50 mmHg  Pulse 86  Temp(Src) 97.6 F (36.4 C) (Oral)  Resp 20  Ht 5\' 1"  (1.549 m)  Wt 107.6 kg (237 lb 3.4 oz)  BMI 44.84  kg/m2  SpO2 99%  Wt Readings from Last 3 Encounters:  08/20/15 107.6 kg (237 lb 3.4 oz)  08/08/15 97.1 kg (214 lb 1.1 oz)           BP Readings from Last 3 Encounters:  08/20/15 99/50  08/11/15 140/57    Constitutional: NAD Neck: supple, no thyromegaly Respiratory: No wheezing, no rhonchi, decreased breath sounds right base Cardiovascular: IRR, no murmur, no gallop Abdomen: soft, good BS, nontender Extremities: no edema Neuro: alert and oriented, no focal motor or sensory deficits  ASSESSMENT/PLAN:  Labs and imaging studies were reviewed  Complicated hospital-acquired pneumonia with sepsis syndrome-substantial right lung changes,  speech evaluation ok, stop IV ceftazidime/clindamycin per ID as sputum has been negative Pleural effusion-some reaccumulation on the right with volume overload Recurrent nausea and vomiting -symptoms better  A. Fib- rate  controlled when not coughing Hypoglycemia-sliding scale insulin, no routine insulin ESRD-daily dialysis has been significantly helpful to pulmonary symptoms MRSA-extensive vancomycin treatment, intranasal Bactroban, recheck for MRSA outpt Morbid obesity To [employer]

## 2015-08-20 NOTE — Care Management (Signed)
Approached by patient's husband explaining why he didn't want patient to go to [facility] only offered a semi-private room and he would not be able to stay with her continuously unless it is private. Per MD patient will discharge back to [facility]. Patient/husand want to return home but patient needs a higher level of care at this time based on medical status. RNCM will continue to follow.

## 2015-08-20 NOTE — Progress Notes (Signed)
Patient A&O, cooperative with care.  Received HD today and will again tomorrow.  3Liters removed today with HD.  Patient has poor appetite.  Denies any pain.  Plans to go to [facility] because patient does not want to have a semi private room.

## 2015-08-20 NOTE — Progress Notes (Signed)
POST HD   08/20/15 1215  Report  Report Received From Shelle Iron, RN  Vital Signs  Temp 97.6 F (36.4 C)  Temp Source Oral  Pulse Rate 96  Pulse Rate Source Monitor  Resp (!) 28  BP (!) 121/92 mmHg  BP Location Right Arm  BP Method Automatic  Patient Position (if appropriate) Lying  During Hemodialysis Assessment  Intra-Hemodialysis Comments HD TREATMENT END.  PT ALERT AND VS STABLE. BLOOD RETURNED AND 15G NEEDLES REMOVED WITHOUT DIFFICULTY.    Post-Hemodialysis Assessment  Rinseback Volume (mL) 250 mL  Dialyzer Clearance Heavily streaked  Duration of HD Treatment -hour(s) 3 hour(s)  Hemodialysis Intake (mL) 500 mL  UF Total -Machine (mL) 3500 mL  Net UF (mL) 3000 mL  Tolerated HD Treatment Yes  Post-Hemodialysis Comments HD TREATMENT END.  PT ALERT AND VS STABLE. BLOOD RETURNED AND 15G NEEDLES REMOVED WITHOUT DIFFICULTY.    AVG/AVF Arterial Site Held (minutes) 10 minutes  AVG/AVF Venous Site Held (minutes) 10 minutes  Education / Care Plan  Hemodialysis Education Provided Yes  Documented Education in Clinical Pathway Yes  Fistula / Graft Left Forearm Arteriovenous fistula  No Placement Date or Time found.   Placed prior to admission: Yes  Orientation: Left  Access Location: Forearm  Access Type: Arteriovenous fistula  Site Condition No complications  Fistula / Graft Assessment Present;Thrill;Bruit  Status Deaccessed  Needle Size 15  Drainage Description None

## 2015-08-21 LAB — CBC WITH DIFFERENTIAL/PLATELET
BAND NEUTROPHILS: 0 % (ref 0–10)
BASOS ABS: 0 10*3/uL (ref 0.0–0.1)
BASOS PCT: 0 % (ref 0–1)
Blasts: 0 %
EOS ABS: 0.4 10*3/uL (ref 0.0–0.7)
Eosinophils Relative: 4 % (ref 0–5)
HCT: 25.5 % — ABNORMAL LOW (ref 35.0–47.0)
Hemoglobin: 8.4 g/dL — ABNORMAL LOW (ref 12.0–16.0)
LYMPHS PCT: 20 % (ref 12–46)
Lymphs Abs: 1.9 10*3/uL (ref 0.7–4.0)
MCH: 34.2 pg — AB (ref 26.0–34.0)
MCHC: 32.8 g/dL (ref 32.0–36.0)
MCV: 104.3 fL — ABNORMAL HIGH (ref 80.0–100.0)
METAMYELOCYTES PCT: 2 %
MONO ABS: 1.1 10*3/uL — AB (ref 0.1–1.0)
MYELOCYTES: 0 %
Monocytes Relative: 12 % (ref 3–12)
Neutro Abs: 6 10*3/uL (ref 1.7–7.7)
Neutrophils Relative %: 62 % (ref 43–77)
OTHER: 0 %
PLATELETS: 138 10*3/uL — AB (ref 150–440)
PROMYELOCYTES ABS: 0 %
RBC: 2.45 MIL/uL — ABNORMAL LOW (ref 3.80–5.20)
RDW: 15.5 % — ABNORMAL HIGH (ref 11.5–14.5)
SMEAR REVIEW: ADEQUATE
WBC: 9.4 10*3/uL (ref 3.6–11.0)
nRBC: 0 /100 WBC

## 2015-08-21 LAB — BASIC METABOLIC PANEL
Anion gap: 8 (ref 5–15)
CO2: 33 mmol/L — ABNORMAL HIGH (ref 22–32)
CREATININE: 2.1 mg/dL — AB (ref 0.44–1.00)
Calcium: 8.6 mg/dL — ABNORMAL LOW (ref 8.9–10.3)
Chloride: 98 mmol/L — ABNORMAL LOW (ref 101–111)
GFR, EST AFRICAN AMERICAN: 25 mL/min — AB (ref >60)
GFR, EST NON AFRICAN AMERICAN: 21 mL/min — AB (ref >60)
Glucose, Bld: 157 mg/dL — ABNORMAL HIGH (ref 65–99)
POTASSIUM: 3.8 mmol/L (ref 3.5–5.1)
SODIUM: 139 mmol/L (ref 135–145)

## 2015-08-21 LAB — GLUCOSE, CAPILLARY
GLUCOSE-CAPILLARY: 159 mg/dL — AB (ref 65–99)
Glucose-Capillary: 121 mg/dL — ABNORMAL HIGH (ref 65–99)

## 2015-08-21 MED ORDER — ALBUMIN HUMAN 25 % IV SOLN
25.0000 g | Freq: Once | INTRAVENOUS | Status: AC
  Administered 2015-08-21: 25 g via INTRAVENOUS
  Filled 2015-08-21 (×2): qty 100

## 2015-08-21 NOTE — Progress Notes (Signed)
Advised that patient can return to [facility] after HD today- SNF rep has been updated and awaiting final dc order and summary. Eduard Clos, MSW, [place]

## 2015-08-21 NOTE — Care Management (Signed)
Callback from Dr. Juleen China stating that patient may discharge to [facility] and resume outpatient dialysis tomorrow. Daily dialysis is not needed. Patient can discharge if medically cleared after dialysis. Husband and patient would like for patient to discharge to [facility] today.

## 2015-08-21 NOTE — Progress Notes (Signed)
Plans are tentative for dc to SNF bed after HD today. Patient and husband aware and agreeable to this plan. RN to call report and EMS once dc is confirmed.   Eduard Clos, MSW, [place]

## 2015-08-21 NOTE — Progress Notes (Signed)
PT Cancellation Note  Patient Details Name: [name] MRN: [id number] DOB: [birth year]   Cancelled Treatment:    Reason Eval/Treat Not Completed: Patient at procedure or test/unavailable (In HD again today, nursing reports pt weak and lethargic).  Try later as time allows   Ramond Dial 08/21/2015, 11:21 AM   Mee Hives, PT MS Acute Rehab Dept. Number: [facility] [id number] and [place] [phone]

## 2015-08-21 NOTE — Progress Notes (Signed)
[facility]  ROUNDING NOTE   Subjective:   Hemodialysis again for today. Husband at bedside.    Objective:  Vital signs in last 24 hours:  Temp:  [97 F (36.1 C)-97.6 F (36.4 C)] 97 F (36.1 C) (09/08 0758) Pulse Rate:  [80-96] 95 (09/08 0758) Resp:  [18-30] 20 (09/08 0758) BP: (98-123)/(53-92) 122/66 mmHg (09/08 0758) SpO2:  [98 %-100 %] 98 % (09/08 0732) FiO2 (%):  [3 %] 3 % (09/07 1309) Weight:  [103.329 kg (227 lb 12.8 oz)-105.507 kg (232 lb 9.6 oz)] 103.329 kg (227 lb 12.8 oz) (09/08 0712)  Weight change: 4.6 kg (10 lb 2.3 oz) Filed Weights   08/20/15 0845 08/20/15 1309 08/21/15 0712  Weight: 109.1 kg (240 lb 8.4 oz) 105.507 kg (232 lb 9.6 oz) 103.329 kg (227 lb 12.8 oz)    Intake/Output: I/O last 3 completed shifts: In: 460 [P.O.:10; IV Piggyback:450] Out: 3000 [Other:3000]   Intake/Output this shift:     Physical Exam: General: Chronically ill appearing  Head: Normocephalic, atraumatic. Moist oral mucosal membranes  Eyes: Anicteric, PERRL  Neck: Supple, trachea midline  Lungs:  Clear bilaterally  Heart: Regular rate and rhythm  Abdomen:  Soft, nontender, BS present  Extremities: 1+ peripheral edema.  Neurologic: Nonfocal, moving all four extremities  Skin: No lesions  Access: LUE AVF    Basic Metabolic Panel:  Recent Labs Lab 08/15/15 0958  08/17/15 0530 08/18/15 0630 08/19/15 0630 08/20/15 1150 08/21/15 0457  NA  --   < > 136 136 140 138 139  K  --   < > 3.5 3.2* 3.4* 3.9 3.8  CL  --   < > 97* 97* 101 98* 98*  CO2  --   < > 31 34* 34* 31 33*  GLUCOSE  --   < > 129* 149* 160* 150* 157*  BUN  --   < > 6 5* <5* <5* <5*  CREATININE  --   < > 2.27* 2.18* 2.12* 2.20* 2.10*  CALCIUM  --   < > 8.0* 7.9* 8.4* 8.5* 8.6*  PHOS 2.6  --   --  1.6*  --  1.4*  --   < > = values in this interval not displayed.  Liver Function Tests:  Recent Labs Lab 08/15/15 0517 08/16/15 0449 08/17/15 0530 08/18/15 0630 08/19/15 0630 08/20/15 1150   AST 25 22 19 19 21   --   ALT 13* 12* 10* 9* 11*  --   ALKPHOS 126 107 118 105 113  --   BILITOT 1.1 0.7 0.6 0.5 0.4  --   PROT 6.0* 5.3* 5.4* 5.3* 5.7*  --   ALBUMIN 2.6* 2.3* 2.2* 2.2* 2.3* 2.4*   No results for input(s): LIPASE, AMYLASE in the last 168 hours. No results for input(s): AMMONIA in the last 168 hours.  CBC:  Recent Labs Lab 08/16/15 0449 08/17/15 0530 08/17/15 1457 08/18/15 0630 08/19/15 0630 08/20/15 1150 08/21/15 0457  WBC 9.2 7.6 8.5 7.3 9.0 10.7 9.4  NEUTROABS 5.8 4.8  --  4.8 5.3  --  6.0  HGB 8.3* 8.1* 8.5* 8.0* 8.8* 9.0* 8.4*  HCT 24.9* 24.8* 25.8* 24.2* 26.8* 27.5* 25.5*  MCV 104.3* 103.1* 103.3* 103.8* 103.8* 105.0* 104.3*  PLT 122* 117* 127* 118* 133* 142* 138*    Cardiac Enzymes: No results for input(s): CKTOTAL, CKMB, CKMBINDEX, TROPONINI in the last 168 hours.  BNP: Invalid input(s): POCBNP  CBG:  Recent Labs Lab 08/19/15 2047 08/20/15 1303 08/20/15 1556 08/20/15 2035  08/21/15 0756  GLUCAP 250* 125* 201* 176* 159*    Microbiology: Results for orders placed or performed during the hospital encounter of 08/14/15  MRSA PCR Screening     Status: Abnormal   Collection Time: 08/14/15  5:43 AM  Result Value Ref Range Status   MRSA by PCR POSITIVE (A) NEGATIVE Final    Comment:        The GeneXpert MRSA Assay (FDA approved for NASAL specimens only), is one component of a comprehensive MRSA colonization surveillance program. It is not intended to diagnose MRSA infection nor to guide or monitor treatment for MRSA infections. CRITICAL RESULT CALLED TO, READ BACK BY AND VERIFIED WITH: BYRON HADDOCK AT [place] ON 08/14/15.Marland KitchenMarland KitchenHaymarket Medical Center   Body fluid culture     Status: None   Collection Time: 08/14/15 11:34 AM  Result Value Ref Range Status   Specimen Description PLEURAL  Final   Special Requests NONE  Final   Gram Stain NO WBC SEEN NO ORGANISMS SEEN   Final   Culture NO GROWTH 4 DAYS  Final   Report Status 08/18/2015 FINAL  Final   Culture, sputum-assessment     Status: None   Collection Time: 08/15/15  3:57 PM  Result Value Ref Range Status   Specimen Description EXPECTORATED SPUTUM  Final   Special Requests NONE  Final   Sputum evaluation THIS SPECIMEN IS ACCEPTABLE FOR SPUTUM CULTURE  Final   Report Status 08/15/2015 FINAL  Final  Culture, respiratory (NON-Expectorated)     Status: None   Collection Time: 08/15/15  3:57 PM  Result Value Ref Range Status   Specimen Description EXPECTORATED SPUTUM  Final   Special Requests NONE Reflexed from [id number]  Final   Gram Stain   Final    FEW WBC SEEN FEW GRAM POSITIVE COCCI IN PAIRS RARE GRAM NEGATIVE RODS GOOD SPECIMEN - 80-90% WBCS    Culture APPEARS TO BE NORMAL FLORA  Final   Report Status 08/20/2015 FINAL  Final    Coagulation Studies: No results for input(s): LABPROT, INR in the last 72 hours.  Urinalysis: No results for input(s): COLORURINE, LABSPEC, PHURINE, GLUCOSEU, HGBUR, BILIRUBINUR, KETONESUR, PROTEINUR, UROBILINOGEN, NITRITE, LEUKOCYTESUR in the last 72 hours.  Invalid input(s): APPERANCEUR    Imaging: No results found.   Medications:     . albumin human  25 g Intravenous Once  . allopurinol  50 mg Oral Daily  . amiodarone  100 mg Oral BID  . aspirin EC  81 mg Oral Daily  . calcium acetate  1,334 mg Oral TID WC  . cefTAZidime (FORTAZ)  IV  1 g Intravenous Q24H  . cholecalciferol  1,000 Units Oral Daily  . clindamycin (CLEOCIN) IV  300 mg Intravenous 3 times per day  . fluticasone  2 spray Each Nare QHS  . heparin  5,000 Units Subcutaneous 3 times per day  . insulin aspart  0-9 Units Subcutaneous TID WC  . ipratropium-albuterol  3 mL Nebulization Q6H  . metoprolol tartrate  25 mg Oral BID  . midodrine  5 mg Oral TID WC  . multivitamin with minerals  1 tablet Oral Daily  . mupirocin ointment   Nasal BID  . pantoprazole  40 mg Oral Q1200  . potassium chloride  20 mEq Oral BID  . sodium chloride  3 mL Intravenous Q12H  . tiZANidine   4 mg Oral BID   acetaminophen **OR** acetaminophen, alteplase, benzonatate, docusate sodium, heparin, lidocaine (PF), lidocaine-prilocaine, ondansetron **OR** ondansetron (ZOFRAN) IV, pentafluoroprop-tetrafluoroeth, zolpidem  Assessment/ Plan:  81 y.o. female Diabetes mellitus type 2 x 20 years, ESRD ON HD, HTN ,Psoriasis, Sleep apnea, CPAP, Recurrent vertigo, Essential tremor, Bilateral knee replacement, ACD, Secondary hyperparathyroidism, Cholcystecomy, Hysterectomy, Bilateral Tubal ligation, Carpal tunnel release, admitted on 08/14/2015 for Hypoglycemia [E16.2] Aspiration pneumonia, unspecified aspiration pneumonia type [J69.0] Hypotension, unspecified hypotension type [I95.9]  1.  ESRD on HD MWF: extra hemodialysis treatment last two daysto assist with respiratory status. Dialysis last two days with total ultrafiltration of 12 litres.  -  Will provide midodrine prior to treatment. Albumin IV - outpatient EDW of 92.5 kg. After today's treatment, resume MWF schedule.    2.  Bacterial pneumonia/aspiration pneumonia/pleural effusion:  Had thoracentesis 08/14/15. Wbc has improved. Continues productive cough.  - appreciate ID input: clindamycin and ceftazidime  3.  Anemia of CKD:  hgb 8.4,  continue epogen 10000 units IV with HD on MWF.   4.  SHPTH:  Phos 1.4 - hold calcium acetate and sevelamer.  - hold cinacalcet - PTH 43 on 8/22  5.  Hypotension:  midodine before treatment.    LOS: [place], [place] 9/8/201610:44 AM

## 2015-08-21 NOTE — Care Management (Signed)
Notified by [facility] RN that patient was SOB and 3rd spacing. Per [name] dialysis liaison she spoke with nephrology Dr. Juleen China and he does not feel that patient is ready for discharge to SNF. Husband still disagreeing with LTAC. Patient still daily dialysis. High risk for readmission. Asked Dr. Sabra Heck if he thought palliative should be involved; patient will not consider palliative. I had this conversation with her husband a few days ago and he is expecting death if patient cannot maintain fluids. He would like to spend most of his hours with her which is why he doesn't want her to go to LTAC in a semiprivate room- he cannot stay with her. [facility] has private rooms.

## 2015-08-21 NOTE — Progress Notes (Signed)
Pharmacy Consult for Ceftazidime Indication: HCAP  Allergies  Allergen Reactions  . Angiotensin Receptor Blockers     Other reaction(s): Other (See Comments) hyperkalemia  . Penicillins Rash    Patient Measurements: Height: 5\' 1"  (154.9 cm) Weight: 227 lb 12.8 oz (103.329 kg) IBW/kg (Calculated) : 47.8  Vital Signs: Temp: 97 F (36.1 C) (09/08 0758) Temp Source: Oral (09/08 0758) BP: 122/66 mmHg (09/08 0758) Pulse Rate: 95 (09/08 0758) Intake/Output from previous day: 09/07 0701 - 09/08 0700 In: 210 [P.O.:10; IV Piggyback:200] Out: 3000  Intake/Output from this shift:    Labs:  Recent Labs  08/19/15 0630 08/20/15 1150 08/21/15 0457  WBC 9.0 10.7 9.4  HGB 8.8* 9.0* 8.4*  PLT 133* 142* 138*  CREATININE 2.12* 2.20* 2.10*   Estimated Creatinine Clearance: 24 mL/min (by C-G formula based on Cr of 2.1). No results for input(s): VANCOTROUGH, VANCOPEAK, VANCORANDOM, GENTTROUGH, GENTPEAK, GENTRANDOM, TOBRATROUGH, TOBRAPEAK, TOBRARND, AMIKACINPEAK, AMIKACINTROU, AMIKACIN in the last 72 hours.   Microbiology: Recent Results (from the past 720 hour(s))  Blood culture (routine x 2)     Status: None   Collection Time: 08/02/15 11:59 AM  Result Value Ref Range Status   Specimen Description BLOOD LEFT ASSIST CONTROL  Final   Special Requests   Final    BOTTLES DRAWN AEROBIC AND ANAEROBIC  AER 6CC ANA [place]   Culture NO GROWTH 5 DAYS  Final   Report Status 08/07/2015 FINAL  Final  Blood culture (routine x 2)     Status: None   Collection Time: 08/02/15 12:00 PM  Result Value Ref Range Status   Specimen Description BLOOD LEFT FOREARM  Final   Special Requests BOTTLES DRAWN AEROBIC AND ANAEROBIC  1CC  Final   Culture NO GROWTH 5 DAYS  Final   Report Status 08/07/2015 FINAL  Final  Culture, expectorated sputum-assessment     Status: None   Collection Time: 08/02/15  8:37 PM  Result Value Ref Range Status   Specimen Description SPUTUM  Final   Special Requests Normal  Final    Sputum evaluation THIS SPECIMEN IS ACCEPTABLE FOR SPUTUM CULTURE  Final   Report Status 08/04/2015 FINAL  Final  MRSA PCR Screening     Status: Abnormal   Collection Time: 08/02/15  8:37 PM  Result Value Ref Range Status   MRSA by PCR POSITIVE (A) NEGATIVE Final    Comment:        The GeneXpert MRSA Assay (FDA approved for NASAL specimens only), is one component of a comprehensive MRSA colonization surveillance program. It is not intended to diagnose MRSA infection nor to guide or monitor treatment for MRSA infections. CRITICAL RESULT CALLED TO, READ BACK BY AND VERIFIED WITH: MARCEL TURNER AT 2214 08/02/15.PMH   Culture, respiratory (NON-Expectorated)     Status: None   Collection Time: 08/02/15  8:37 PM  Result Value Ref Range Status   Specimen Description SPUTUM  Final   Special Requests Normal Reflexed from [id number]  Final   Gram Stain   Final    FAIR SPECIMEN - 70-80% WBCS FEW WBC SEEN MANY GRAM POSITIVE COCCI    Culture NORMAL UPPER RESPIRATORY FLORA  Final   Report Status 08/05/2015 FINAL  Final  Body fluid culture     Status: None   Collection Time: 08/04/15  2:51 PM  Result Value Ref Range Status   Specimen Description PLEURAL  Final   Special Requests NONE  Final   Gram Stain NO WBC SEEN NO ORGANISMS SEEN  Final   Culture NO GROWTH 4 DAYS  Final   Report Status 08/08/2015 FINAL  Final  MRSA PCR Screening     Status: Abnormal   Collection Time: 08/14/15  5:43 AM  Result Value Ref Range Status   MRSA by PCR POSITIVE (A) NEGATIVE Final    Comment:        The GeneXpert MRSA Assay (FDA approved for NASAL specimens only), is one component of a comprehensive MRSA colonization surveillance program. It is not intended to diagnose MRSA infection nor to guide or monitor treatment for MRSA infections. CRITICAL RESULT CALLED TO, READ BACK BY AND VERIFIED WITH: BYRON HADDOCK AT [place] ON 08/14/15.Marland KitchenMarland KitchenSt Anthony Hospital   Body fluid culture     Status: None   Collection Time:  08/14/15 11:34 AM  Result Value Ref Range Status   Specimen Description PLEURAL  Final   Special Requests NONE  Final   Gram Stain NO WBC SEEN NO ORGANISMS SEEN   Final   Culture NO GROWTH 4 DAYS  Final   Report Status 08/18/2015 FINAL  Final  Culture, sputum-assessment     Status: None   Collection Time: 08/15/15  3:57 PM  Result Value Ref Range Status   Specimen Description EXPECTORATED SPUTUM  Final   Special Requests NONE  Final   Sputum evaluation THIS SPECIMEN IS ACCEPTABLE FOR SPUTUM CULTURE  Final   Report Status 08/15/2015 FINAL  Final  Culture, respiratory (NON-Expectorated)     Status: None   Collection Time: 08/15/15  3:57 PM  Result Value Ref Range Status   Specimen Description EXPECTORATED SPUTUM  Final   Special Requests NONE Reflexed from [id number]  Final   Gram Stain   Final    FEW WBC SEEN FEW GRAM POSITIVE COCCI IN PAIRS RARE GRAM NEGATIVE RODS GOOD SPECIMEN - 80-90% WBCS    Culture APPEARS TO BE NORMAL FLORA  Final   Report Status 08/20/2015 FINAL  Final     Assessment: Pharmacy consulted to dose ceftazidime for PNA in this 81 year old female. Patient was recently admitted for PNA thought to be to MRSA and treated with IV clindamycin and vanc., now readmitted after possible aspiration event. Being treated for suspected MRSA/aspiration PNA with clindamycin and ceftazidime.   Plan:  Will continue ceftazidime 1 g iv q 24 hours.   Rexene Edison, PharmD Clinical Pharmacist   08/21/2015,10:49 AM

## 2015-08-26 ENCOUNTER — Other Ambulatory Visit: Payer: Self-pay | Admitting: Specialist

## 2015-08-26 DIAGNOSIS — J90 Pleural effusion, not elsewhere classified: Secondary | ICD-10-CM

## 2015-08-28 ENCOUNTER — Other Ambulatory Visit: Payer: Self-pay | Admitting: Radiology

## 2015-08-29 ENCOUNTER — Ambulatory Visit
Admission: RE | Admit: 2015-08-29 | Discharge: 2015-08-29 | Disposition: A | Discharge status: Home / Self Care | Payer: Medicare Other | Source: Ambulatory Visit | Attending: Specialist | Admitting: Specialist

## 2015-08-29 DIAGNOSIS — J90 Pleural effusion, not elsewhere classified: Secondary | ICD-10-CM

## 2015-08-29 DIAGNOSIS — N186 End stage renal disease: Secondary | ICD-10-CM | POA: Diagnosis not present

## 2015-08-29 DIAGNOSIS — J948 Other specified pleural conditions: Secondary | ICD-10-CM | POA: Diagnosis present

## 2015-08-29 LAB — BODY FLUID CELL COUNT WITH DIFFERENTIAL
Eos, Fluid: 9 %
LYMPHS FL: 68 %
MONOCYTE-MACROPHAGE-SEROUS FLUID: 12 %
NEUTROPHIL FLUID: 11 %
Other Cells, Fluid: 0 %
Total Nucleated Cell Count, Fluid: 21 cu mm

## 2015-08-29 LAB — PROTEIN, BODY FLUID: TOTAL PROTEIN, FLUID: 3.5 g/dL

## 2015-08-29 NOTE — Discharge Instructions (Signed)
Thoracentesis  A thoracentesis procedure is done to remove fluid that has built up in the space between your chest wall and your lungs (pleural space). Although you always have a small amount of fluid in the pleural space, some medical conditions, such as heart failure, can create too much fluid. This extra fluid is removed using a needle inserted through the skin and tissue and into the pleural space.   A thoracentesis may be done to:  · Understand why there is extra fluid and create the treatment plan that is right for you.  · Help get rid of shortness of breath, discomfort, or pain caused by the extra fluid.  LET YOUR HEALTH CARE PROVIDER KNOW ABOUT:  · Any allergies you have.  · All medicines you are taking, including vitamins, herbs, eye drops, creams, and over-the-counter medicines. Be sure to mention any use of steroids, either by mouth or cream.  · Previous problems you or members of your family have had with the use of anesthetics.  · Possibility of pregnancy, if this applies.  · Any blood disorders you have, including a history of blood clots.  · Previous surgeries you have had.  · Medical conditions you have.  RISKS AND COMPLICATIONS  Generally, this is a safe procedure. However, problems can occur and include:   · Injury to the lung.  · Possible infection.  · Possible collapse of a lung.  BEFORE THE PROCEDURE  · Ask your health care provider if you need to arrive early before your procedure.  · Inform your health care provider if you have had a frequent cough. If you have had frequent coughing episodes, your health care provider may want you to take a cough suppressant.  · You may have a chest X-ray to determine the location and amount of fluid in the pleural space.  · Ask your health care provider about:  ¨ Changing or stopping your regular medicines. This is especially important if you are taking diabetes medicines or blood thinners.  ¨ Taking medicines such as aspirin and ibuprofen. These medicines  can thin your blood. Do not take these medicines before your procedure if your health care provider asks you not to.  PROCEDURE  · You may be asked to sit upright and lean slightly forward for the procedure.  · An area of your back will be cleaned and disinfected.  · A numbing medicine (local anesthetic) may then be injected into the skin and tissue.  · A needle will be inserted between your ribs and advanced into the pleural space. You may feel pressure or slight pain as the needle is positioned into the pleural space.  · Fluid will be removed from the pleural space through the needle. You may feel pressure as the fluid is removed.  · The needle will be withdrawn once the excess fluid has been removed. A sample of the fluid may be sent for examination.  · The puncture site may be covered with a bandage (dressing).  AFTER THE PROCEDURE  · A chest X-ray may be done.  · Your recovery will be assessed and monitored. If there are no problems, you should be able to go home shortly after the procedure.  · Ask your health care provider when your test results will be ready. Make sure to get your test results.  · Follow your health care provider's instructions on dressing changes and removal.  HOME CARE INSTRUCTIONS   · You may resume your normal diet and activities as   directed by your health care provider.  · Take medicines only as directed by your health care provider.  SEEK MEDICAL CARE IF:   · You have drainage, redness, swelling, or pain at your puncture site.  · You have a fever.  SEEK IMMEDIATE MEDICAL CARE IF:   You have shortness of breath or chest pain.   Document Released: 06/14/2005 Document Revised: 04/15/2014 Document Reviewed: 09/26/2008  Exit[employer]® Patient Information ©2015 [employer]. This information is not intended to replace advice given to you by your health care provider. Make sure you discuss any questions you have with your health care provider.

## 2015-09-01 LAB — CYTOLOGY - NON PAP

## 2015-09-01 LAB — PATHOLOGIST SMEAR REVIEW

## 2015-09-02 LAB — BODY FLUID CULTURE

## 2015-09-09 ENCOUNTER — Other Ambulatory Visit: Payer: Self-pay | Admitting: Nurse Practitioner

## 2015-09-09 DIAGNOSIS — R935 Abnormal findings on diagnostic imaging of other abdominal regions, including retroperitoneum: Secondary | ICD-10-CM

## 2015-09-16 ENCOUNTER — Ambulatory Visit
Admission: RE | Admit: 2015-09-16 | Discharge: 2015-09-16 | Disposition: A | Discharge status: Home / Self Care | Payer: Medicare Other | Source: Ambulatory Visit | Attending: Nurse Practitioner | Admitting: Nurse Practitioner

## 2015-09-16 DIAGNOSIS — R935 Abnormal findings on diagnostic imaging of other abdominal regions, including retroperitoneum: Secondary | ICD-10-CM | POA: Diagnosis not present

## 2015-09-16 DIAGNOSIS — Z9049 Acquired absence of other specified parts of digestive tract: Secondary | ICD-10-CM | POA: Insufficient documentation

## 2015-09-18 ENCOUNTER — Ambulatory Visit
Admission: RE | Admit: 2015-09-18 | Discharge: 2015-09-18 | Disposition: A | Discharge status: Home / Self Care | Payer: Medicare Other | Source: Ambulatory Visit | Attending: Nurse Practitioner | Admitting: Nurse Practitioner

## 2015-09-18 DIAGNOSIS — R935 Abnormal findings on diagnostic imaging of other abdominal regions, including retroperitoneum: Secondary | ICD-10-CM

## 2015-09-23 ENCOUNTER — Ambulatory Visit
Admission: RE | Admit: 2015-09-23 | Discharge: 2015-09-23 | Disposition: A | Discharge status: Home / Self Care | Payer: Medicare Other | Source: Ambulatory Visit | Attending: Nurse Practitioner | Admitting: Nurse Practitioner

## 2015-09-23 DIAGNOSIS — R935 Abnormal findings on diagnostic imaging of other abdominal regions, including retroperitoneum: Secondary | ICD-10-CM | POA: Diagnosis present

## 2015-10-09 ENCOUNTER — Ambulatory Visit: Payer: Medicare Other | Admitting: Anesthesiology

## 2015-10-09 ENCOUNTER — Encounter: Payer: Self-pay | Admitting: *Deleted

## 2015-10-09 ENCOUNTER — Ambulatory Visit
Admission: RE | Admit: 2015-10-09 | Discharge: 2015-10-09 | Disposition: A | Discharge status: Home / Self Care | Payer: Medicare Other | Source: Ambulatory Visit | Attending: Gastroenterology | Admitting: Gastroenterology

## 2015-10-09 ENCOUNTER — Encounter
Admission: RE | Disposition: A | Discharge status: Home / Self Care | Payer: Self-pay | Source: Ambulatory Visit | Attending: Gastroenterology

## 2015-10-09 DIAGNOSIS — E1122 Type 2 diabetes mellitus with diabetic chronic kidney disease: Secondary | ICD-10-CM | POA: Diagnosis not present

## 2015-10-09 DIAGNOSIS — Z992 Dependence on renal dialysis: Secondary | ICD-10-CM | POA: Insufficient documentation

## 2015-10-09 DIAGNOSIS — Z888 Allergy status to other drugs, medicaments and biological substances status: Secondary | ICD-10-CM | POA: Insufficient documentation

## 2015-10-09 DIAGNOSIS — I509 Heart failure, unspecified: Secondary | ICD-10-CM | POA: Diagnosis not present

## 2015-10-09 DIAGNOSIS — G4733 Obstructive sleep apnea (adult) (pediatric): Secondary | ICD-10-CM | POA: Insufficient documentation

## 2015-10-09 DIAGNOSIS — Z7901 Long term (current) use of anticoagulants: Secondary | ICD-10-CM | POA: Diagnosis not present

## 2015-10-09 DIAGNOSIS — Z85828 Personal history of other malignant neoplasm of skin: Secondary | ICD-10-CM | POA: Insufficient documentation

## 2015-10-09 DIAGNOSIS — R1310 Dysphagia, unspecified: Secondary | ICD-10-CM | POA: Diagnosis not present

## 2015-10-09 DIAGNOSIS — D649 Anemia, unspecified: Secondary | ICD-10-CM | POA: Diagnosis not present

## 2015-10-09 DIAGNOSIS — Z7982 Long term (current) use of aspirin: Secondary | ICD-10-CM | POA: Insufficient documentation

## 2015-10-09 DIAGNOSIS — I132 Hypertensive heart and chronic kidney disease with heart failure and with stage 5 chronic kidney disease, or end stage renal disease: Secondary | ICD-10-CM | POA: Insufficient documentation

## 2015-10-09 DIAGNOSIS — Z880 Allergy status to penicillin: Secondary | ICD-10-CM | POA: Insufficient documentation

## 2015-10-09 DIAGNOSIS — Z6839 Body mass index (BMI) 39.0-39.9, adult: Secondary | ICD-10-CM | POA: Insufficient documentation

## 2015-10-09 DIAGNOSIS — I4891 Unspecified atrial fibrillation: Secondary | ICD-10-CM | POA: Insufficient documentation

## 2015-10-09 DIAGNOSIS — N186 End stage renal disease: Secondary | ICD-10-CM | POA: Diagnosis not present

## 2015-10-09 DIAGNOSIS — G250 Essential tremor: Secondary | ICD-10-CM | POA: Diagnosis not present

## 2015-10-09 HISTORY — PX: ESOPHAGOGASTRODUODENOSCOPY (EGD) WITH PROPOFOL: SHX5813

## 2015-10-09 LAB — GLUCOSE, CAPILLARY: Glucose-Capillary: 76 mg/dL (ref 65–99)

## 2015-10-09 SURGERY — ESOPHAGOGASTRODUODENOSCOPY (EGD) WITH PROPOFOL
Anesthesia: General

## 2015-10-09 MED ORDER — LIDOCAINE HCL (CARDIAC) 20 MG/ML IV SOLN
INTRAVENOUS | Status: DC | PRN
  Administered 2015-10-09: 50 mg via INTRAVENOUS

## 2015-10-09 MED ORDER — PROPOFOL 500 MG/50ML IV EMUL
INTRAVENOUS | Status: DC | PRN
  Administered 2015-10-09: 100 ug/kg/min via INTRAVENOUS

## 2015-10-09 MED ORDER — PHENYLEPHRINE HCL 10 MG/ML IJ SOLN
INTRAMUSCULAR | Status: DC | PRN
  Administered 2015-10-09 (×2): 100 ug via INTRAVENOUS

## 2015-10-09 MED ORDER — SODIUM CHLORIDE 0.9 % IV SOLN: INTRAVENOUS | Status: DC

## 2015-10-09 MED ORDER — MIDAZOLAM HCL 2 MG/2ML IJ SOLN
INTRAMUSCULAR | Status: DC | PRN
  Administered 2015-10-09: 1 mg via INTRAVENOUS

## 2015-10-09 MED ORDER — FENTANYL CITRATE (PF) 100 MCG/2ML IJ SOLN
INTRAMUSCULAR | Status: DC | PRN
  Administered 2015-10-09: 50 ug via INTRAVENOUS

## 2015-10-09 MED ORDER — SODIUM CHLORIDE 0.9 % IV SOLN
INTRAVENOUS | Status: DC
  Administered 2015-10-09: 08:00:00 via INTRAVENOUS

## 2015-10-09 NOTE — Op Note (Signed)
[facility] Gastroenterology Patient Name: [name] Procedure Date: 10/09/2015 8:40 AM MRN: [id number] Account #: [id number] Date of Birth: 08-Nov-1934 Admit Type: Outpatient Age: 81 Room: [facility] ENDO ROOM 4 Gender: Female Note Status: Finalized Procedure:         Upper GI endoscopy Indications:       Dysphagia Providers:         Lupita Dawn. Candace Cruise, MD Referring MD:      Rusty Aus, MD (Referring MD) Medicines:         Monitored Anesthesia Care Complications:     No immediate complications. Procedure:         Pre-Anesthesia Assessment:                    - Prior to the procedure, a History and Physical was                     performed, and patient medications, allergies and                     sensitivities were reviewed. The patient's tolerance of                     previous anesthesia was reviewed.                    - The risks and benefits of the procedure and the sedation                     options and risks were discussed with the patient. All                     questions were answered and informed consent was obtained.                    - After reviewing the risks and benefits, the patient was                     deemed in satisfactory condition to undergo the procedure.                    After obtaining informed consent, the endoscope was passed                     under direct vision. Throughout the procedure, the                     patient's blood pressure, pulse, and oxygen saturations                     were monitored continuously. The Endoscope was introduced                     through the mouth, and advanced to the second part of                     duodenum. The upper GI endoscopy was accomplished without                     difficulty. The patient tolerated the procedure well. Findings:      No endoscopic abnormality was evident in the esophagus to explain the       patient's complaint of dysphagia. It was decided, however, to proceed   with dilation of the entire esophagus. The  scope was withdrawn. Dilation       was performed with a Maloney dilator with mild resistance at 9 Fr.      The entire examined stomach was normal.      The examined duodenum was normal. Impression:        - No endoscopic esophageal abnormality to explain                     patient's dysphagia. Esophagus dilated. Dilated.                    - Normal stomach.                    - Normal examined duodenum.                    - No specimens collected. Recommendation:    - Discharge patient to home.                    - Observe patient's clinical course.                    - The findings and recommendations were discussed with the                     patient. Procedure Code(s): --- Professional ---                    [phone], Esophagogastroduodenoscopy, flexible, transoral;                     diagnostic, including collection of specimen(s) by                     brushing or washing, when performed (separate procedure)                    43450, Dilation of esophagus, by unguided sound or bougie,                     single or multiple passes Diagnosis Code(s): --- Professional ---                    R13.10, Dysphagia, unspecified CPT copyright 2014 American Medical Association. All rights reserved. The codes documented in this report are preliminary and upon coder review may  be revised to meet current compliance requirements. Hulen Luster, MD 10/09/2015 8:55:21 AM This report has been signed electronically. Number of Addenda: 0 Note Initiated On: 10/09/2015 8:40 AM      [facility]

## 2015-10-09 NOTE — Transfer of Care (Signed)
Immediate Anesthesia Transfer of Care Note  Patient: [name]  Procedure(s) Performed: Procedure(s): ESOPHAGOGASTRODUODENOSCOPY (EGD) WITH PROPOFOL (N/A)  Patient Location: PACU  Anesthesia Type:General  Level of Consciousness: awake and alert   Airway & Oxygen Therapy: Patient Spontanous Breathing and Patient connected to nasal cannula oxygen  Post-op Assessment: Report given to RN and Post -op Vital signs reviewed and unstable, Anesthesiologist notified  Post vital signs: b/p low, GVS notified  Last Vitals:  Filed Vitals:   10/09/15 0908  BP:   Pulse:   Temp: 36.9 C  Resp: 18    Complications: No apparent anesthesia complications

## 2015-10-09 NOTE — Anesthesia Postprocedure Evaluation (Signed)
  Anesthesia Post-op Note  Patient: [name]  Procedure(s) Performed: Procedure(s): ESOPHAGOGASTRODUODENOSCOPY (EGD) WITH PROPOFOL (N/A)  Anesthesia type:General  Patient location: PACU  Post pain: Pain level controlled  Post assessment: Post-op Vital signs reviewed, Patient's Cardiovascular Status Stable, Respiratory Function Stable, Patent Airway and No signs of Nausea or vomiting  Post vital signs: Reviewed and stable  Last Vitals:  Filed Vitals:   10/09/15 0920  BP: 88/44  Pulse: 129  Temp:   Resp: 20    Level of consciousness: awake, alert  and patient cooperative  Complications: No apparent anesthesia complications

## 2015-10-09 NOTE — Anesthesia Procedure Notes (Signed)
Performed by: COOK-MARTIN, Micheline Markes Pre-anesthesia Checklist: Patient identified, Emergency Drugs available, Suction available, Patient being monitored and Timeout performed Patient Re-evaluated:Patient Re-evaluated prior to inductionOxygen Delivery Method: Nasal cannula Preoxygenation: Pre-oxygenation with 100% oxygen Intubation Type: IV induction Airway Equipment and Method: Bite block Placement Confirmation: positive ETCO2     

## 2015-10-09 NOTE — Anesthesia Preprocedure Evaluation (Signed)
Anesthesia Evaluation  Patient identified by MRN, date of birth, ID band Patient awake    Reviewed: Allergy & Precautions, NPO status , Patient's Chart, lab work & pertinent test results  Airway Mallampati: III       Dental  (+) Caps   Pulmonary sleep apnea ,     + decreased breath sounds      Cardiovascular hypertension, Pt. on medications and Pt. on home beta blockers +CHF and + DOE  Normal cardiovascular exam+ dysrhythmias      Neuro/Psych    GI/Hepatic negative GI ROS, Neg liver ROS,   Endo/Other  diabetes, Type 1, Insulin DependentMorbid obesity  Renal/GU DialysisRenal disease     Musculoskeletal   Abdominal (+) + obese,   Peds  Hematology  (+) anemia ,   Anesthesia Other Findings   Reproductive/Obstetrics                             Anesthesia Physical Anesthesia Plan  ASA: IV  Anesthesia Plan: General   Post-op Pain Management:    Induction: Intravenous  Airway Management Planned: Nasal Cannula  Additional Equipment:   Intra-op Plan:   Post-operative Plan:   Informed Consent: I have reviewed the patients History and Physical, chart, labs and discussed the procedure including the risks, benefits and alternatives for the proposed anesthesia with the patient or authorized representative who has indicated his/her understanding and acceptance.     Plan Discussed with: CRNA  Anesthesia Plan Comments:         Anesthesia Quick Evaluation

## 2015-10-09 NOTE — H&P (Signed)
Primary Care Physician:  Rusty Aus., MD Primary Gastroenterologist:  Dr. Candace Cruise  Pre-Procedure History & Physical: HPI:  [name] is a 81 y.o. female is here for an EGD.   Past Medical History  Diagnosis Date  . Skin cancer     Resected from legs  . Renal insufficiency     Patient is on dialysis and normal days are M,W and F.  . Diabetes mellitus without complication ([place])     Patient takes Insulin  . ESRD (end stage renal disease) ([place])     Monday, wednesday, Friday DIalysis  . HTN (hypertension)   . Essential tremor   . OSA (obstructive sleep apnea)   . A-fib (H[facility])     not on anticoagulation  . Bilateral lower extremity edema   . Osteoarthritis   . Dysrhythmia   . CHF (congestive heart failure) ([place])   . Anemia     Past Surgical History  Procedure Laterality Date  . Abdominal hysterectomy    . Cholecystectomy    . Femur fracture surgery      left femur  . Tubal ligation    . Ankle fracture surgery      right ankle  . Neuroplasty / transposition median nerve at carpal tunnel bilateral    . Knee arthroplasty    . Peripheral vascular catheterization Left 08/06/2015    Procedure: A/V Shuntogram/Fistulagram;  Surgeon: Algernon Huxley, MD;  Location: [place] [facility];  Service: Cardiovascular;  Laterality: Left;    Prior to Admission medications   Medication Sig Start Date End Date Taking? Authorizing Provider  allopurinol (ZYLOPRIM) 100 MG tablet Take 50 mg by mouth daily. 07/16/15  Yes Historical Provider, MD  amiodarone (PACERONE) 100 MG tablet Take 1 tablet (100 mg total) by mouth 2 (two) times daily. 08/19/15  Yes Rusty Aus, MD  calcium acetate (PHOSLO) 667 MG capsule Take 2 capsules (1,334 mg total) by mouth 3 (three) times daily with meals. 08/08/15  Yes Adrian Prows, MD  insulin aspart (NOVOLOG) 100 UNIT/ML injection Inject 0-9 Units into the skin 3 (three) times daily with meals. 08/19/15  Yes Rusty Aus, MD  midodrine (PROAMATINE) 5 MG tablet  Take 1 tablet (5 mg total) by mouth 3 (three) times daily with meals. 08/20/15  Yes Rusty Aus, MD  pantoprazole (PROTONIX) 40 MG tablet Take 40 mg by mouth daily. 07/10/15  Yes Historical Provider, MD  potassium chloride (KLOR-CON) 20 MEQ packet Take 20 mEq by mouth 2 (two) times daily. 08/20/15  Yes Rusty Aus, MD  propranolol (INDERAL) 20 MG tablet Take 20 mg by mouth 3 (three) times daily.   Yes Historical Provider, MD  RENVELA 800 MG tablet Take 1,600 mg by mouth 3 (three) times daily with meals. And with snacks. 06/30/15  Yes Historical Provider, MD  SENSIPAR 30 MG tablet Take 30 mg by mouth daily. 07/30/15  Yes Historical Provider, MD  acetaminophen (TYLENOL) 325 MG tablet Take 2 tablets (650 mg total) by mouth every 6 (six) hours as needed for mild pain (or Fever >/= 101). 08/08/15   Adrian Prows, MD  aspirin EC 81 MG EC tablet Take 1 tablet (81 mg total) by mouth daily. Patient not taking: Reported on 10/09/2015 08/19/15   Rusty Aus, MD  benzonatate (TESSALON) 100 MG capsule Take 1 capsule (100 mg total) by mouth 3 (three) times daily as needed for cough. 08/08/15   Adrian Prows, MD  Cholecalciferol (VITAMIN D3) 1000 UNITS  CAPS Take 1,000 Units by mouth daily.    Historical Provider, MD  dextromethorphan (DELSYM) 30 MG/5ML liquid Take 5 mLs (30 mg total) by mouth 2 (two) times daily. Patient not taking: Reported on 08/29/2015 08/08/15   Adrian Prows, MD  docusate sodium (COLACE) 100 MG capsule Take 1 capsule (100 mg total) by mouth 2 (two) times daily as needed for mild constipation. Patient not taking: Reported on 08/29/2015 08/08/15   Adrian Prows, MD  fluticasone [facility]) 50 MCG/ACT nasal spray Place 2 sprays into both nostrils at bedtime. 07/26/15   Historical Provider, MD  guaiFENesin (MUCINEX) 600 MG 12 hr tablet Take 1 tablet (600 mg total) by mouth 2 (two) times daily. 08/08/15   Adrian Prows, MD  HYDROcodone-acetaminophen (NORCO/VICODIN) 5-325 MG per tablet Take 1  tablet by mouth every 6 (six) hours as needed. For pain. 08/08/15   Adrian Prows, MD  ipratropium-albuterol (DUONEB) 0.5-2.5 (3) MG/3ML SOLN Take 3 mLs by nebulization 4 (four) times daily. 08/08/15   Adrian Prows, MD  metoprolol tartrate (LOPRESSOR) 25 MG tablet Take 1 tablet (25 mg total) by mouth 2 (two) times daily. Patient not taking: Reported on 08/29/2015 08/19/15   Rusty Aus, MD  Multiple Vitamin (DAILY VITE PO) Take 1 tablet by mouth daily.    Historical Provider, MD  mupirocin ointment (BACTROBAN) 2 % Place into the nose 2 (two) times daily. 08/20/15   Rusty Aus, MD  ondansetron [facility]) 4 MG/2ML SOLN injection Inject 2 mLs (4 mg total) into the vein every 6 (six) hours as needed for nausea or vomiting. 08/08/15   Adrian Prows, MD  promethazine (PHENERGAN) 25 MG tablet Take 25 mg by mouth 2 (two) times daily as needed. For nausea/vomiting. 07/07/15   Historical Provider, MD  ranitidine (ZANTAC) 300 MG tablet Take 300 mg by mouth daily. 07/09/15   Historical Provider, MD    Allergies as of 09/16/2015 - Review Complete 08/29/2015  Allergen Reaction Noted  . Angiotensin receptor blockers  08/02/2015  . Penicillins Rash 07/24/2015    Family History  Problem Relation Age of Onset  . Diabetes type II Father     Social History   Social History  . Marital Status: Married    Spouse Name: N/A  . Number of Children: N/A  . Years of Education: N/A   Occupational History  . Not on file.   Social History Main Topics  . Smoking status: Never Smoker   . Smokeless tobacco: Never Used  . Alcohol Use: No  . Drug Use: No  . Sexual Activity: Not on file   Other Topics Concern  . Not on file   Social History Narrative    Review of Systems: See HPI, otherwise negative ROS  Physical Exam: BP 120/68 mmHg  Pulse 110  Temp(Src) 99 F (37.2 C) (Tympanic)  Resp 12  Ht 5\' 1"  (1.549 m)  Wt 93.895 kg (207 lb)  BMI 39.13 kg/m2  SpO2 100% General:   Alert,  pleasant and  cooperative in NAD Head:  Normocephalic and atraumatic. Neck:  Supple; no masses or thyromegaly. Lungs:  Clear throughout to auscultation.    Heart:  Regular rate and rhythm. Abdomen:  Soft, nontender and nondistended. Normal bowel sounds, without guarding, and without rebound.   Neurologic:  Alert and  oriented x4;  grossly normal neurologically.  Impression/Plan: [name] is here for an EGD with possible dysphagia to be performed for dysphagia.  Risks, benefits, limitations, and alternatives regarding EGD have been  reviewed with the patient.  Questions have been answered.  All parties agreeable.   [name], Connie Dawn, MD  10/09/2015, 8:02 AM

## 2015-10-12 ENCOUNTER — Encounter: Payer: Self-pay | Admitting: Gastroenterology

## 2015-11-19 ENCOUNTER — Other Ambulatory Visit: Payer: Self-pay | Admitting: Vascular Surgery

## 2015-11-20 ENCOUNTER — Ambulatory Visit
Admission: RE | Admit: 2015-11-20 | Discharge: 2015-11-20 | Disposition: A | Discharge status: Home / Self Care | Payer: Medicare Other | Source: Ambulatory Visit | Attending: Vascular Surgery | Admitting: Vascular Surgery

## 2015-11-20 ENCOUNTER — Encounter
Admission: RE | Disposition: A | Discharge status: Home / Self Care | Payer: Self-pay | Source: Ambulatory Visit | Attending: Vascular Surgery

## 2015-11-20 DIAGNOSIS — D649 Anemia, unspecified: Secondary | ICD-10-CM | POA: Diagnosis not present

## 2015-11-20 DIAGNOSIS — I120 Hypertensive chronic kidney disease with stage 5 chronic kidney disease or end stage renal disease: Secondary | ICD-10-CM | POA: Diagnosis not present

## 2015-11-20 DIAGNOSIS — I509 Heart failure, unspecified: Secondary | ICD-10-CM | POA: Diagnosis not present

## 2015-11-20 DIAGNOSIS — G250 Essential tremor: Secondary | ICD-10-CM | POA: Diagnosis not present

## 2015-11-20 DIAGNOSIS — I4891 Unspecified atrial fibrillation: Secondary | ICD-10-CM | POA: Insufficient documentation

## 2015-11-20 DIAGNOSIS — M1990 Unspecified osteoarthritis, unspecified site: Secondary | ICD-10-CM | POA: Insufficient documentation

## 2015-11-20 DIAGNOSIS — Z992 Dependence on renal dialysis: Secondary | ICD-10-CM | POA: Insufficient documentation

## 2015-11-20 DIAGNOSIS — E1122 Type 2 diabetes mellitus with diabetic chronic kidney disease: Secondary | ICD-10-CM | POA: Insufficient documentation

## 2015-11-20 DIAGNOSIS — N186 End stage renal disease: Secondary | ICD-10-CM | POA: Insufficient documentation

## 2015-11-20 DIAGNOSIS — G4733 Obstructive sleep apnea (adult) (pediatric): Secondary | ICD-10-CM | POA: Diagnosis not present

## 2015-11-20 DIAGNOSIS — Y832 Surgical operation with anastomosis, bypass or graft as the cause of abnormal reaction of the patient, or of later complication, without mention of misadventure at the time of the procedure: Secondary | ICD-10-CM | POA: Diagnosis not present

## 2015-11-20 DIAGNOSIS — T82858A Stenosis of vascular prosthetic devices, implants and grafts, initial encounter: Secondary | ICD-10-CM | POA: Insufficient documentation

## 2015-11-20 DIAGNOSIS — Z85828 Personal history of other malignant neoplasm of skin: Secondary | ICD-10-CM | POA: Diagnosis not present

## 2015-11-20 DIAGNOSIS — Z880 Allergy status to penicillin: Secondary | ICD-10-CM | POA: Insufficient documentation

## 2015-11-20 HISTORY — PX: PERIPHERAL VASCULAR CATHETERIZATION: SHX172C

## 2015-11-20 LAB — POTASSIUM (ARMC VASCULAR LAB ONLY): POTASSIUM (ARMC VASCULAR LAB): 3.8 (ref 3.5–5.1)

## 2015-11-20 LAB — GLUCOSE, CAPILLARY: Glucose-Capillary: 99 mg/dL (ref 65–99)

## 2015-11-20 SURGERY — A/V SHUNTOGRAM/FISTULAGRAM
Anesthesia: Moderate Sedation

## 2015-11-20 MED ORDER — ACETAMINOPHEN 325 MG PO TABS: 325.0000 mg | ORAL_TABLET | ORAL | Status: DC | PRN

## 2015-11-20 MED ORDER — CLINDAMYCIN PHOSPHATE 300 MG/50ML IV SOLN
INTRAVENOUS | Status: AC
Start: 2015-11-20 — End: 2015-11-20
  Administered 2015-11-20: 14:00:00
  Filled 2015-11-20: qty 50

## 2015-11-20 MED ORDER — SODIUM CHLORIDE 0.9 % IV SOLN: 500.0000 mL | Freq: Once | INTRAVENOUS | Status: DC | PRN

## 2015-11-20 MED ORDER — HEPARIN SODIUM (PORCINE) 1000 UNIT/ML IJ SOLN
INTRAMUSCULAR | Status: AC
  Filled 2015-11-20: qty 1

## 2015-11-20 MED ORDER — FAMOTIDINE 20 MG PO TABS: 40.0000 mg | ORAL_TABLET | ORAL | Status: DC | PRN

## 2015-11-20 MED ORDER — METHYLPREDNISOLONE SODIUM SUCC 125 MG IJ SOLR: 125.0000 mg | INTRAMUSCULAR | Status: DC | PRN

## 2015-11-20 MED ORDER — SODIUM CHLORIDE 0.9 % IV SOLN
INTRAVENOUS | Status: DC
  Administered 2015-11-20: 12:00:00 via INTRAVENOUS

## 2015-11-20 MED ORDER — LIDOCAINE-EPINEPHRINE (PF) 1 %-1:200000 IJ SOLN
INTRAMUSCULAR | Status: AC
  Filled 2015-11-20: qty 30

## 2015-11-20 MED ORDER — CLINDAMYCIN PHOSPHATE 300 MG/50ML IV SOLN: 300.0000 mg | Freq: Once | INTRAVENOUS | Status: DC

## 2015-11-20 MED ORDER — OXYCODONE-ACETAMINOPHEN 5-325 MG PO TABS: 1.0000 | ORAL_TABLET | ORAL | Status: DC | PRN

## 2015-11-20 MED ORDER — ONDANSETRON HCL 4 MG/2ML IJ SOLN: 4.0000 mg | Freq: Four times a day (QID) | INTRAMUSCULAR | Status: DC | PRN

## 2015-11-20 MED ORDER — MIDAZOLAM HCL 5 MG/5ML IJ SOLN
INTRAMUSCULAR | Status: AC
  Filled 2015-11-20: qty 5

## 2015-11-20 MED ORDER — LABETALOL HCL 5 MG/ML IV SOLN: 10.0000 mg | INTRAVENOUS | Status: DC | PRN

## 2015-11-20 MED ORDER — HEPARIN SODIUM (PORCINE) 1000 UNIT/ML IJ SOLN
INTRAMUSCULAR | Status: DC | PRN
  Administered 2015-11-20: 3000 [IU] via INTRAVENOUS

## 2015-11-20 MED ORDER — HYDROMORPHONE HCL 1 MG/ML IJ SOLN: 1.0000 mg | Freq: Once | INTRAMUSCULAR | Status: DC

## 2015-11-20 MED ORDER — PHENOL 1.4 % MT LIQD: 1.0000 | OROMUCOSAL | Status: DC | PRN

## 2015-11-20 MED ORDER — ALUM & MAG HYDROXIDE-SIMETH 200-200-20 MG/5ML PO SUSP: 15.0000 mL | ORAL | Status: DC | PRN

## 2015-11-20 MED ORDER — METOPROLOL TARTRATE 1 MG/ML IV SOLN: 2.0000 mg | INTRAVENOUS | Status: DC | PRN

## 2015-11-20 MED ORDER — GUAIFENESIN-DM 100-10 MG/5ML PO SYRP: 15.0000 mL | ORAL_SOLUTION | ORAL | Status: DC | PRN

## 2015-11-20 MED ORDER — FENTANYL CITRATE (PF) 100 MCG/2ML IJ SOLN
INTRAMUSCULAR | Status: DC | PRN
  Administered 2015-11-20: 50 ug via INTRAVENOUS

## 2015-11-20 MED ORDER — MIDAZOLAM HCL 2 MG/2ML IJ SOLN
INTRAMUSCULAR | Status: DC | PRN
  Administered 2015-11-20: 2 mg via INTRAVENOUS

## 2015-11-20 MED ORDER — ACETAMINOPHEN 325 MG RE SUPP: 325.0000 mg | RECTAL | Status: DC | PRN

## 2015-11-20 MED ORDER — MORPHINE SULFATE (PF) 4 MG/ML IV SOLN: 2.0000 mg | INTRAVENOUS | Status: DC | PRN

## 2015-11-20 MED ORDER — IOHEXOL 300 MG/ML  SOLN
INTRAMUSCULAR | Status: DC | PRN
  Administered 2015-11-20: 25 mL via INTRAVENOUS

## 2015-11-20 MED ORDER — HEPARIN (PORCINE) IN NACL 2-0.9 UNIT/ML-% IJ SOLN
INTRAMUSCULAR | Status: AC
  Filled 2015-11-20: qty 1000

## 2015-11-20 MED ORDER — HYDRALAZINE HCL 20 MG/ML IJ SOLN: 5.0000 mg | INTRAMUSCULAR | Status: DC | PRN

## 2015-11-20 MED ORDER — FENTANYL CITRATE (PF) 100 MCG/2ML IJ SOLN
INTRAMUSCULAR | Status: AC
  Filled 2015-11-20: qty 2

## 2015-11-20 SURGICAL SUPPLY — 10 items
BALLN DORADO 8X40X80 (BALLOONS) ×4
BALLOON DORADO 8X40X80 (BALLOONS) ×2 IMPLANT
CANNULA 5F STIFF (CANNULA) ×4 IMPLANT
CATH TORCON 5FR 0.38 (CATHETERS) ×4 IMPLANT
DEVICE PRESTO INFLATION (MISCELLANEOUS) ×4 IMPLANT
DRAPE BRACHIAL (DRAPES) ×4 IMPLANT
PACK ANGIOGRAPHY (CUSTOM PROCEDURE TRAY) ×4 IMPLANT
SHEATH BRITE TIP 6FRX5.5 (SHEATH) ×4 IMPLANT
TOWEL OR 17X26 4PK STRL BLUE (TOWEL DISPOSABLE) ×4 IMPLANT
WIRE MAGIC TOR.035 180C (WIRE) ×4 IMPLANT

## 2015-11-20 NOTE — H&P (Signed)
[name] Admission History & Physical  MRN : [id number]  [name] is a 81 y.o. ([birth year]) female who presents with chief complaint of No chief complaint on file. Marland Kitchen  History of Present Illness: Patient sent from dialysis center for poorly functioning left arm AVF.  No pain.  No other complaints.  Told it was pulling clots and had poor flow. No bleeding issues.  No fever or chills  Current Facility-Administered Medications  Medication Dose Route Frequency Provider Last Rate Last Dose  . 0.9 %  sodium chloride infusion   Intravenous Continuous Kimberly A Stegmayer, PA-C 10 mL/hr at 11/20/15 1216    . clindamycin (CLEOCIN) 300 MG/50ML IVPB           . clindamycin (CLEOCIN) IVPB 300 mg  300 mg Intravenous Once [employer], PA-C      . famotidine (PEPCID) tablet 40 mg  40 mg Oral PRN Janalyn Harder Stegmayer, PA-C      . HYDROmorphone (DILAUDID) injection 1 mg  1 mg Intravenous Once [employer], PA-C      . methylPREDNISolone sodium succinate (SOLU-MEDROL) 125 mg/2 mL injection 125 mg  125 mg Intravenous PRN Kimberly A Stegmayer, PA-C      . ondansetron (ZOFRAN) injection 4 mg  4 mg Intravenous Q6H PRN Sela Hua, PA-C        Past Medical History  Diagnosis Date  . Skin cancer     Resected from legs  . Renal insufficiency     Patient is on dialysis and normal days are M,W and F.  . Diabetes mellitus without complication ([place])     Patient takes Insulin  . ESRD (end stage renal disease) ([place])     Monday, wednesday, Friday DIalysis  . HTN (hypertension)   . Essential tremor   . OSA (obstructive sleep apnea)   . A-fib (HCC)     not on anticoagulation  . Bilateral lower extremity edema   . Osteoarthritis   . Dysrhythmia   . CHF (congestive heart failure) ([place])   . Anemia     Past Surgical History  Procedure Laterality Date  . Abdominal hysterectomy    . Cholecystectomy    . Femur fracture surgery      left femur  .  Tubal ligation    . Ankle fracture surgery      right ankle  . Neuroplasty / transposition median nerve at carpal tunnel bilateral    . Knee arthroplasty    . Peripheral vascular catheterization Left 08/06/2015    Procedure: A/V Shuntogram/Fistulagram;  Surgeon: Algernon Huxley, MD;  Location: [place] CV LAB;  Service: Cardiovascular;  Laterality: Left;  . Esophagogastroduodenoscopy (egd) with propofol N/A 10/09/2015    Procedure: ESOPHAGOGASTRODUODENOSCOPY (EGD) WITH PROPOFOL;  Surgeon: Hulen Luster, MD;  Location: [facility] ENDOSCOPY;  Service: Gastroenterology;  Laterality: N/A;    Social History Social History  Substance Use Topics  . Smoking status: Never Smoker   . Smokeless tobacco: Never Used  . Alcohol Use: No  married, lives with husband  Family History Family History  Problem Relation Age of Onset  . Diabetes type II Father   no bleeding disorder or clotting disorders Brother with ESRD as well  Allergies  Allergen Reactions  . Angiotensin Receptor Blockers     Other reaction(s): Other (See Comments) hyperkalemia  . Penicillins Rash     REVIEW OF SYSTEMS (Negative unless checked)  Constitutional: [] Weight loss  [] Fever  [] Chills  Cardiac: [] Chest pain   [] Chest pressure   [] Palpitations   [] Shortness of breath when laying flat   [] Shortness of breath at rest   [x] Shortness of breath with exertion. Vascular:  [] Pain in legs with walking   [] Pain in legs at rest   [] Pain in legs when laying flat   [] Claudication   [] Pain in feet when walking  [] Pain in feet at rest  [] Pain in feet when laying flat   [] History of DVT   [] Phlebitis   [] Swelling in legs   [] Varicose veins   [] Non-healing ulcers Pulmonary:   [] Uses home oxygen   [] Productive cough   [] Hemoptysis   [] Wheeze  [] COPD   [] Asthma Neurologic:  [] Dizziness  [] Blackouts   [] Seizures   [] History of stroke   [] History of TIA  [] Aphasia   [] Temporary blindness   [] Dysphagia   [] Weakness or numbness in arms   [] Weakness or  numbness in legs Musculoskeletal:  [] Arthritis   [] Joint swelling   [] Joint pain   [] Low back pain Hematologic:  [] Easy bruising  [] Easy bleeding   [] Hypercoagulable state   [] Anemic  [] Hepatitis Gastrointestinal:  [] Blood in stool   [] Vomiting blood  [] Gastroesophageal reflux/heartburn   [] Difficulty swallowing. Genitourinary:  [x] Chronic kidney disease   [] Difficult urination  [] Frequent urination  [] Burning with urination   [] Blood in urine Skin:  [] Rashes   [] Ulcers   [] Wounds Psychological:  [] History of anxiety   []  History of major depression.  Physical Examination  Filed Vitals:   11/20/15 1159  BP: 110/72  Pulse: 108  Temp: 98.1 F (36.7 C)  TempSrc: Oral  Resp: 20  Height: 5\' 1"  (1.549 m)  Weight: 91.627 kg (202 lb)  SpO2: 100%   Body mass index is 38.19 kg/(m^2). Gen: WD/WN, NAD Head: [place]/AT, No temporalis wasting. Prominent temp pulse not noted. Ear/Nose/Throat: Hearing grossly intact, nares w/o erythema or drainage, oropharynx w/o Erythema/Exudate,  Eyes: PERRLA, EOMI.  Neck: Supple, no nuchal rigidity.  No bruit or JVD.  Pulmonary:  Good air movement, clear to auscultation bilaterally, no use of accessory muscles.  Cardiac: RRR, normal S1, S2, no Murmurs, rubs or gallops. Vascular: bruit present in left arm AVF, aneurysmal Vessel Right Left  Radial Palpable Palpable                                   Gastrointestinal: soft, non-tender/non-distended. No guarding/reflex.  Musculoskeletal: M/S 5/5 throughout.  Extremities without ischemic changes.  No deformity or atrophy.  Neurologic: CN 2-12 intact. Pain and light touch intact in extremities.  Symmetrical.  Speech is fluent. Motor exam as listed above. Psychiatric: Judgment intact, Mood & affect appropriate for pt's clinical situation. Dermatologic: No rashes or ulcers noted.  No cellulitis or open wounds. Lymph : No Cervical, Axillary, or Inguinal lymphadenopathy.      CBC Lab Results  Component  Value Date   WBC 9.4 08/21/2015   HGB 8.4* 08/21/2015   HCT 25.5* 08/21/2015   MCV 104.3* 08/21/2015   PLT 138* 08/21/2015    BMET    Component Value Date/Time   NA 139 08/21/2015 0457   NA 130* 03/10/2015 0426   K 3.8 08/21/2015 0457   K 5.3* 03/10/2015 0811   CL 98* 08/21/2015 0457   CL 91* 03/10/2015 0426   CO2 33* 08/21/2015 0457   CO2 26 03/10/2015 0426   GLUCOSE 157* 08/21/2015 0457   GLUCOSE 85 03/10/2015 0426  BUN <5* 08/21/2015 0457   BUN 41* 03/10/2015 0426   CREATININE 2.10* 08/21/2015 0457   CREATININE 6.65* 03/10/2015 0426   CALCIUM 8.6* 08/21/2015 0457   CALCIUM 7.6* 03/10/2015 0426   GFRNONAA 21* 08/21/2015 0457   GFRNONAA 5* 03/10/2015 0426   GFRAA 25* 08/21/2015 0457   GFRAA 6* 03/10/2015 0426   CrCl cannot be calculated (Patient has no serum creatinine result on file.).  COAG Lab Results  Component Value Date   INR 1.16 08/04/2015   INR 1.2 03/06/2015   INR 1.1 03/05/2015    Radiology No results found.    Assessment/Plan 1. Dysfunction of dialysis access.  Sent from access center for poor flow and pulling clots.  For fistulagram today.  Risks and benefits discussed 2. ESRD. On HD on M/W/F.  Needs better access function per report 3. DM. Stable. Continue outpatient meds   DEW,JASON, MD  11/20/2015 1:19 PM

## 2015-11-20 NOTE — Discharge Instructions (Signed)

## 2015-11-20 NOTE — Op Note (Signed)
[facility]    OPERATIVE NOTE   PROCEDURE: 1.   Left radiocephalic arteriovenous fistula cannulation under ultrasound guidance 2.   Left arm fistulagram including central venogram 3.   Percutaneous transluminal angioplasty for in-stent stenosis with 21m diameter by 4 cm length high pressure angioplasty balloon to the distal forearm cephalic vein stent 4.   Percutaneous transluminal angioplasty of proximal forearm cephalic vein for separate and distinct stenosis with 843mdiameter by 4 cm length high pressure angioplasty balloon  PRE-OPERATIVE DIAGNOSIS: 1. ESRD 2. Poorly functional left radiocephalic AVF  POST-OPERATIVE DIAGNOSIS: same as above   SURGEON: JaLeotis PainMD  ANESTHESIA: local with MCS  ESTIMATED BLOOD LOSS: Minimal  FINDING(S): 1. 2 areas of moderate stenosis in the forearm cephalic vein. The remainder of the fistula was patent  SPECIMEN(S):  None  CONTRAST: 25 cc  INDICATIONS: [name] a [age].o. female who presents with malfunctioning  left radiocephalic arteriovenous fistula.  The patient is scheduled for  left arm fistulagram.  The patient is aware the risks include but are not limited to: bleeding, infection, thrombosis of the cannulated access, and possible anaphylactic reaction to the contrast.  The patient is aware of the risks of the procedure and elects to proceed forward.  DESCRIPTION: After full informed written consent was obtained, the patient was brought back to the angiography suite and placed supine upon the angiography table.  The patient was connected to monitoring equipment.  The  left arm was prepped and draped in the standard fashion for a percutaneous access intervention.  Under ultrasound guidance, the  radial artery distal to the arteriovenous fistula was cannulated with a micropuncture needle under direct ultrasound guidance and a permanent image was performed.  The microwire was advanced into the fistula and the  needle was exchanged for the a microsheath.  I then upsized to a 6 Fr Sheath and imaging was performed.  Hand injections were completed to image the access including the central venous system. This demonstrated an area of in-stent stenosis in the distal forearm cephalic vein a few centimeters beyond the anastomosis that was in the 60% range. Further up the forearm in a separate and distinct location in the proximal forearm cephalic vein, an area of about 70% stenosis was identified. There was then dual outflow through the upper arm between the basilic vein and the cephalic vein and the central venous circulation was patent without significant stenosis.  Based on the images, this patient will need intervention to the 2 forearm stenoses. I then gave the patient 3000 units of intravenous heparin.  I then crossed the stenoses with a Magic Tourqe wire.  Based on the imaging, a 8 mm x 4 cm  high pressure angioplasty balloon was selected.  The balloon was centered around the proximal forearm cephalic vein stenosis and inflated to 14 ATM for 1 minute(s).  On completion imaging, a 25-30 % residual stenosis was present.  While the balloon was inflated, imaging was performed to evaluate the arterial anastomosis and radial artery which appeared widely patent. I then turned my attention to the distal forearm cephalic vein stenosis for the separate and distinct lesion. The 75m54miameter by 4 cm length high pressure angioplasty balloon was moved to this location and inflation was performed to 16 atm for 1 minute. On completion imaging, only about a 10-15% residual stenosis was identified.  Based on the completion imaging, no further intervention is necessary.  The wire and balloon were removed  from the sheath.  A 4-0 Monocryl purse-string suture was sewn around the sheath.  The sheath was removed while tying down the suture.  A sterile bandage was applied to the puncture site.  COMPLICATIONS: None  CONDITION:  Stable   DEW,JASON  11/20/2015 2:15 PM

## 2015-11-21 ENCOUNTER — Encounter: Payer: Self-pay | Admitting: Vascular Surgery

## 2016-03-08 ENCOUNTER — Other Ambulatory Visit: Payer: Self-pay | Admitting: Internal Medicine

## 2016-03-08 DIAGNOSIS — Z1231 Encounter for screening mammogram for malignant neoplasm of breast: Secondary | ICD-10-CM

## 2016-03-23 ENCOUNTER — Ambulatory Visit
Admission: RE | Admit: 2016-03-23 | Discharge: 2016-03-23 | Disposition: A | Discharge status: Home / Self Care | Payer: Medicare Other | Source: Ambulatory Visit | Attending: Internal Medicine | Admitting: Internal Medicine

## 2016-03-23 DIAGNOSIS — Z1231 Encounter for screening mammogram for malignant neoplasm of breast: Secondary | ICD-10-CM | POA: Diagnosis present

## 2016-04-06 ENCOUNTER — Encounter: Payer: Self-pay | Admitting: *Deleted

## 2016-04-06 ENCOUNTER — Emergency Department: Payer: Medicare Other

## 2016-04-06 ENCOUNTER — Inpatient Hospital Stay
Admission: EM | Admit: 2016-04-06 | Discharge: 2016-04-10 | DRG: 183 | Disposition: A | Discharge status: Home Health | Payer: Medicare Other | Attending: Internal Medicine | Admitting: Internal Medicine

## 2016-04-06 DIAGNOSIS — I132 Hypertensive heart and chronic kidney disease with heart failure and with stage 5 chronic kidney disease, or end stage renal disease: Secondary | ICD-10-CM | POA: Diagnosis present

## 2016-04-06 DIAGNOSIS — E870 Hyperosmolality and hypernatremia: Secondary | ICD-10-CM | POA: Diagnosis present

## 2016-04-06 DIAGNOSIS — E1122 Type 2 diabetes mellitus with diabetic chronic kidney disease: Secondary | ICD-10-CM | POA: Diagnosis present

## 2016-04-06 DIAGNOSIS — Z992 Dependence on renal dialysis: Secondary | ICD-10-CM

## 2016-04-06 DIAGNOSIS — S2242XA Multiple fractures of ribs, left side, initial encounter for closed fracture: Principal | ICD-10-CM | POA: Diagnosis present

## 2016-04-06 DIAGNOSIS — G250 Essential tremor: Secondary | ICD-10-CM | POA: Diagnosis present

## 2016-04-06 DIAGNOSIS — G4733 Obstructive sleep apnea (adult) (pediatric): Secondary | ICD-10-CM | POA: Diagnosis present

## 2016-04-06 DIAGNOSIS — K7460 Unspecified cirrhosis of liver: Secondary | ICD-10-CM | POA: Diagnosis present

## 2016-04-06 DIAGNOSIS — S82302A Unspecified fracture of lower end of left tibia, initial encounter for closed fracture: Secondary | ICD-10-CM | POA: Diagnosis present

## 2016-04-06 DIAGNOSIS — Z7982 Long term (current) use of aspirin: Secondary | ICD-10-CM | POA: Diagnosis not present

## 2016-04-06 DIAGNOSIS — Z79899 Other long term (current) drug therapy: Secondary | ICD-10-CM

## 2016-04-06 DIAGNOSIS — N186 End stage renal disease: Secondary | ICD-10-CM | POA: Diagnosis present

## 2016-04-06 DIAGNOSIS — S82892A Other fracture of left lower leg, initial encounter for closed fracture: Secondary | ICD-10-CM | POA: Diagnosis present

## 2016-04-06 DIAGNOSIS — Z85828 Personal history of other malignant neoplasm of skin: Secondary | ICD-10-CM | POA: Diagnosis not present

## 2016-04-06 DIAGNOSIS — N2581 Secondary hyperparathyroidism of renal origin: Secondary | ICD-10-CM | POA: Diagnosis present

## 2016-04-06 DIAGNOSIS — D631 Anemia in chronic kidney disease: Secondary | ICD-10-CM | POA: Diagnosis present

## 2016-04-06 DIAGNOSIS — I482 Chronic atrial fibrillation: Secondary | ICD-10-CM | POA: Diagnosis present

## 2016-04-06 DIAGNOSIS — R0902 Hypoxemia: Secondary | ICD-10-CM | POA: Diagnosis present

## 2016-04-06 DIAGNOSIS — S82832A Other fracture of upper and lower end of left fibula, initial encounter for closed fracture: Secondary | ICD-10-CM | POA: Diagnosis present

## 2016-04-06 DIAGNOSIS — Z794 Long term (current) use of insulin: Secondary | ICD-10-CM | POA: Diagnosis not present

## 2016-04-06 DIAGNOSIS — S82402A Unspecified fracture of shaft of left fibula, initial encounter for closed fracture: Secondary | ICD-10-CM

## 2016-04-06 DIAGNOSIS — Z880 Allergy status to penicillin: Secondary | ICD-10-CM

## 2016-04-06 DIAGNOSIS — Z833 Family history of diabetes mellitus: Secondary | ICD-10-CM | POA: Diagnosis not present

## 2016-04-06 DIAGNOSIS — Z803 Family history of malignant neoplasm of breast: Secondary | ICD-10-CM | POA: Diagnosis not present

## 2016-04-06 DIAGNOSIS — Z888 Allergy status to other drugs, medicaments and biological substances status: Secondary | ICD-10-CM

## 2016-04-06 DIAGNOSIS — S2232XA Fracture of one rib, left side, initial encounter for closed fracture: Secondary | ICD-10-CM

## 2016-04-06 DIAGNOSIS — M8580 Other specified disorders of bone density and structure, unspecified site: Secondary | ICD-10-CM | POA: Diagnosis present

## 2016-04-06 DIAGNOSIS — I959 Hypotension, unspecified: Secondary | ICD-10-CM | POA: Diagnosis not present

## 2016-04-06 DIAGNOSIS — M1990 Unspecified osteoarthritis, unspecified site: Secondary | ICD-10-CM | POA: Diagnosis present

## 2016-04-06 DIAGNOSIS — S82202A Unspecified fracture of shaft of left tibia, initial encounter for closed fracture: Secondary | ICD-10-CM

## 2016-04-06 DIAGNOSIS — J449 Chronic obstructive pulmonary disease, unspecified: Secondary | ICD-10-CM | POA: Diagnosis present

## 2016-04-06 LAB — CBC WITH DIFFERENTIAL/PLATELET
BASOS PCT: 1 %
Basophils Absolute: 0.1 10*3/uL (ref 0–0.1)
EOS ABS: 0.2 10*3/uL (ref 0–0.7)
Eosinophils Relative: 1 %
HEMATOCRIT: 36.6 % (ref 35.0–47.0)
HEMOGLOBIN: 12.1 g/dL (ref 12.0–16.0)
Lymphocytes Relative: 9 %
Lymphs Abs: 1.2 10*3/uL (ref 1.0–3.6)
MCH: 31.4 pg (ref 26.0–34.0)
MCHC: 33 g/dL (ref 32.0–36.0)
MCV: 95 fL (ref 80.0–100.0)
Monocytes Absolute: 1 10*3/uL — ABNORMAL HIGH (ref 0.2–0.9)
Monocytes Relative: 7 %
NEUTROS ABS: 11.6 10*3/uL — AB (ref 1.4–6.5)
NEUTROS PCT: 82 %
Platelets: 157 10*3/uL (ref 150–440)
RBC: 3.85 MIL/uL (ref 3.80–5.20)
RDW: 16.4 % — ABNORMAL HIGH (ref 11.5–14.5)
WBC: 14.2 10*3/uL — AB (ref 3.6–11.0)

## 2016-04-06 LAB — TROPONIN I
Troponin I: 0.04 ng/mL — ABNORMAL HIGH (ref <0.031)
Troponin I: 0.05 ng/mL — ABNORMAL HIGH (ref <0.031)

## 2016-04-06 LAB — COMPREHENSIVE METABOLIC PANEL
ALBUMIN: 3.2 g/dL — AB (ref 3.5–5.0)
ALK PHOS: 181 U/L — AB (ref 38–126)
ALT: 13 U/L — AB (ref 14–54)
AST: 36 U/L (ref 15–41)
Anion gap: 12 (ref 5–15)
BILIRUBIN TOTAL: 1.7 mg/dL — AB (ref 0.3–1.2)
BUN: 38 mg/dL — AB (ref 6–20)
CALCIUM: 9.1 mg/dL (ref 8.9–10.3)
CO2: 29 mmol/L (ref 22–32)
CREATININE: 4.89 mg/dL — AB (ref 0.44–1.00)
Chloride: 90 mmol/L — ABNORMAL LOW (ref 101–111)
GFR calc Af Amer: 9 mL/min — ABNORMAL LOW (ref >60)
GFR calc non Af Amer: 8 mL/min — ABNORMAL LOW (ref >60)
GLUCOSE: 228 mg/dL — AB (ref 65–99)
Potassium: 3.6 mmol/L (ref 3.5–5.1)
SODIUM: 131 mmol/L — AB (ref 135–145)
TOTAL PROTEIN: 7.3 g/dL (ref 6.5–8.1)

## 2016-04-06 LAB — GLUCOSE, CAPILLARY
GLUCOSE-CAPILLARY: 133 mg/dL — AB (ref 65–99)
Glucose-Capillary: 148 mg/dL — ABNORMAL HIGH (ref 65–99)

## 2016-04-06 LAB — MRSA PCR SCREENING: MRSA BY PCR: NEGATIVE

## 2016-04-06 MED ORDER — BISACODYL 5 MG PO TBEC: 5.0000 mg | DELAYED_RELEASE_TABLET | Freq: Every day | ORAL | Status: DC | PRN

## 2016-04-06 MED ORDER — FAMOTIDINE 20 MG PO TABS: 20.0000 mg | ORAL_TABLET | Freq: Every day | ORAL | Status: DC

## 2016-04-06 MED ORDER — METOCLOPRAMIDE HCL 5 MG/ML IJ SOLN
10.0000 mg | Freq: Once | INTRAMUSCULAR | Status: AC
  Administered 2016-04-06: 10 mg via INTRAVENOUS
  Filled 2016-04-06: qty 2

## 2016-04-06 MED ORDER — IOPAMIDOL (ISOVUE-300) INJECTION 61%
75.0000 mL | Freq: Once | INTRAVENOUS | Status: AC | PRN
  Administered 2016-04-06: 75 mL via INTRAVENOUS

## 2016-04-06 MED ORDER — MORPHINE SULFATE (PF) 4 MG/ML IV SOLN
4.0000 mg | Freq: Once | INTRAVENOUS | Status: AC
  Administered 2016-04-06: 4 mg via INTRAVENOUS
  Filled 2016-04-06: qty 1

## 2016-04-06 MED ORDER — HEPARIN SODIUM (PORCINE) 5000 UNIT/ML IJ SOLN
5000.0000 [IU] | Freq: Three times a day (TID) | INTRAMUSCULAR | Status: DC
Start: 2016-04-06 — End: 2016-04-10
  Administered 2016-04-06 – 2016-04-10 (×10): 5000 [IU] via SUBCUTANEOUS
  Filled 2016-04-06 (×10): qty 1

## 2016-04-06 MED ORDER — CALCIUM ACETATE (PHOS BINDER) 667 MG PO CAPS
1334.0000 mg | ORAL_CAPSULE | Freq: Three times a day (TID) | ORAL | Status: DC
  Administered 2016-04-07 – 2016-04-08 (×2): 1334 mg via ORAL
  Filled 2016-04-06 (×5): qty 2

## 2016-04-06 MED ORDER — TRAZODONE HCL 50 MG PO TABS: 25.0000 mg | ORAL_TABLET | Freq: Every evening | ORAL | Status: DC | PRN

## 2016-04-06 MED ORDER — SEVELAMER CARBONATE 800 MG PO TABS
1600.0000 mg | ORAL_TABLET | Freq: Three times a day (TID) | ORAL | Status: DC
  Administered 2016-04-07: 1600 mg via ORAL
  Filled 2016-04-06: qty 2

## 2016-04-06 MED ORDER — PANTOPRAZOLE SODIUM 40 MG PO TBEC
40.0000 mg | DELAYED_RELEASE_TABLET | Freq: Every day | ORAL | Status: DC
  Administered 2016-04-08 – 2016-04-10 (×3): 40 mg via ORAL
  Filled 2016-04-06 (×3): qty 1

## 2016-04-06 MED ORDER — PROPRANOLOL HCL 20 MG PO TABS
80.0000 mg | ORAL_TABLET | Freq: Two times a day (BID) | ORAL | Status: DC
  Administered 2016-04-07: 80 mg via ORAL
  Filled 2016-04-06: qty 1
  Filled 2016-04-06: qty 4
  Filled 2016-04-06 (×2): qty 2

## 2016-04-06 MED ORDER — IPRATROPIUM-ALBUTEROL 0.5-2.5 (3) MG/3ML IN SOLN
3.0000 mL | Freq: Four times a day (QID) | RESPIRATORY_TRACT | Status: DC
  Administered 2016-04-06: 3 mL via RESPIRATORY_TRACT
  Filled 2016-04-06 (×2): qty 3

## 2016-04-06 MED ORDER — ALLOPURINOL 100 MG PO TABS
100.0000 mg | ORAL_TABLET | Freq: Every day | ORAL | Status: DC
  Administered 2016-04-08 – 2016-04-10 (×3): 100 mg via ORAL
  Filled 2016-04-06 (×3): qty 1

## 2016-04-06 MED ORDER — VITAMIN D 1000 UNITS PO TABS
1000.0000 [IU] | ORAL_TABLET | Freq: Every day | ORAL | Status: DC
Start: 2016-04-06 — End: 2016-04-10
  Administered 2016-04-08 – 2016-04-10 (×3): 1000 [IU] via ORAL
  Filled 2016-04-06 (×5): qty 1

## 2016-04-06 MED ORDER — ADULT MULTIVITAMIN W/MINERALS CH
ORAL_TABLET | Freq: Every day | ORAL | Status: DC
  Administered 2016-04-08 – 2016-04-10 (×3): 1 via ORAL
  Filled 2016-04-06 (×3): qty 1

## 2016-04-06 MED ORDER — ONDANSETRON HCL 4 MG/2ML IJ SOLN: 4.0000 mg | Freq: Four times a day (QID) | INTRAMUSCULAR | Status: DC | PRN

## 2016-04-06 MED ORDER — INSULIN ASPART 100 UNIT/ML ~~LOC~~ SOLN: 0.0000 [IU] | Freq: Three times a day (TID) | SUBCUTANEOUS | Status: DC

## 2016-04-06 MED ORDER — ONDANSETRON HCL 4 MG PO TABS
4.0000 mg | ORAL_TABLET | Freq: Four times a day (QID) | ORAL | Status: DC | PRN
  Administered 2016-04-10: 4 mg via ORAL
  Filled 2016-04-06: qty 1

## 2016-04-06 MED ORDER — BENZONATATE 100 MG PO CAPS: 100.0000 mg | ORAL_CAPSULE | Freq: Three times a day (TID) | ORAL | Status: DC | PRN

## 2016-04-06 MED ORDER — ASPIRIN EC 81 MG PO TBEC
81.0000 mg | DELAYED_RELEASE_TABLET | Freq: Every day | ORAL | Status: DC
  Administered 2016-04-08 – 2016-04-10 (×3): 81 mg via ORAL
  Filled 2016-04-06 (×3): qty 1

## 2016-04-06 MED ORDER — HYDROMORPHONE HCL 1 MG/ML IJ SOLN
2.0000 mg | INTRAMUSCULAR | Status: DC | PRN
  Administered 2016-04-06 – 2016-04-07 (×2): 2 mg via INTRAVENOUS
  Filled 2016-04-06 (×2): qty 2

## 2016-04-06 MED ORDER — ACETAMINOPHEN 325 MG PO TABS: 650.0000 mg | ORAL_TABLET | Freq: Four times a day (QID) | ORAL | Status: DC | PRN

## 2016-04-06 MED ORDER — MIDODRINE HCL 5 MG PO TABS
5.0000 mg | ORAL_TABLET | Freq: Three times a day (TID) | ORAL | Status: DC
  Administered 2016-04-07 – 2016-04-10 (×8): 5 mg via ORAL
  Filled 2016-04-06 (×8): qty 1

## 2016-04-06 MED ORDER — METOPROLOL TARTRATE 25 MG PO TABS: 25.0000 mg | ORAL_TABLET | Freq: Two times a day (BID) | ORAL | Status: DC

## 2016-04-06 MED ORDER — DOCUSATE SODIUM 100 MG PO CAPS: 100.0000 mg | ORAL_CAPSULE | Freq: Two times a day (BID) | ORAL | Status: DC | PRN

## 2016-04-06 MED ORDER — TETANUS-DIPHTH-ACELL PERTUSSIS 5-2.5-18.5 LF-MCG/0.5 IM SUSP
0.5000 mL | Freq: Once | INTRAMUSCULAR | Status: AC
  Administered 2016-04-06: 0.5 mL via INTRAMUSCULAR
  Filled 2016-04-06: qty 0.5

## 2016-04-06 MED ORDER — INSULIN ASPART 100 UNIT/ML ~~LOC~~ SOLN
0.0000 [IU] | Freq: Three times a day (TID) | SUBCUTANEOUS | Status: DC
  Administered 2016-04-07: 2 [IU] via SUBCUTANEOUS
  Administered 2016-04-07: 3 [IU] via SUBCUTANEOUS
  Administered 2016-04-07: 5 [IU] via SUBCUTANEOUS
  Administered 2016-04-08: 3 [IU] via SUBCUTANEOUS
  Filled 2016-04-06: qty 2
  Filled 2016-04-06: qty 3
  Filled 2016-04-06: qty 5
  Filled 2016-04-06: qty 3

## 2016-04-06 MED ORDER — SODIUM CHLORIDE 0.9% FLUSH
3.0000 mL | Freq: Two times a day (BID) | INTRAVENOUS | Status: DC
  Administered 2016-04-06 – 2016-04-10 (×6): 3 mL via INTRAVENOUS

## 2016-04-06 MED ORDER — MUPIROCIN 2 % EX OINT
TOPICAL_OINTMENT | Freq: Two times a day (BID) | CUTANEOUS | Status: DC
  Administered 2016-04-06 – 2016-04-09 (×5): via NASAL
  Filled 2016-04-06: qty 22

## 2016-04-06 MED ORDER — POTASSIUM CHLORIDE 20 MEQ PO PACK
20.0000 meq | PACK | Freq: Two times a day (BID) | ORAL | Status: DC
  Administered 2016-04-06: 20 meq via ORAL
  Filled 2016-04-06: qty 1

## 2016-04-06 MED ORDER — DOCUSATE SODIUM 100 MG PO CAPS
100.0000 mg | ORAL_CAPSULE | Freq: Two times a day (BID) | ORAL | Status: DC
  Administered 2016-04-06 – 2016-04-10 (×7): 100 mg via ORAL
  Filled 2016-04-06 (×7): qty 1

## 2016-04-06 MED ORDER — ACETAMINOPHEN 325 MG PO TABS
650.0000 mg | ORAL_TABLET | Freq: Four times a day (QID) | ORAL | Status: DC | PRN
  Administered 2016-04-09: 650 mg via ORAL
  Filled 2016-04-06: qty 2

## 2016-04-06 MED ORDER — ACETAMINOPHEN 650 MG RE SUPP: 650.0000 mg | Freq: Four times a day (QID) | RECTAL | Status: DC | PRN

## 2016-04-06 MED ORDER — AMIODARONE HCL 100 MG PO TABS
100.0000 mg | ORAL_TABLET | Freq: Two times a day (BID) | ORAL | Status: DC
  Filled 2016-04-06: qty 1

## 2016-04-06 MED ORDER — CINACALCET HCL 30 MG PO TABS
30.0000 mg | ORAL_TABLET | Freq: Every day | ORAL | Status: DC
Start: 2016-04-07 — End: 2016-04-10
  Administered 2016-04-08 – 2016-04-10 (×2): 30 mg via ORAL
  Filled 2016-04-06 (×4): qty 1

## 2016-04-06 MED ORDER — FLUTICASONE PROPIONATE 50 MCG/ACT NA SUSP
2.0000 | Freq: Every day | NASAL | Status: DC
  Administered 2016-04-06 – 2016-04-09 (×4): 2 via NASAL
  Filled 2016-04-06: qty 16
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
## 2016-04-06 NOTE — Consult Note (Signed)
Comminuted left ankle fracture in patient with significant vascular disease. Will plan on non op treatment and will discuss bracing with podiatry.
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
## 2016-04-07 ENCOUNTER — Inpatient Hospital Stay: Payer: Medicare Other

## 2016-04-07 LAB — BASIC METABOLIC PANEL
Anion gap: 14 (ref 5–15)
BUN: 42 mg/dL — AB (ref 6–20)
CALCIUM: 9.2 mg/dL (ref 8.9–10.3)
CHLORIDE: 87 mmol/L — AB (ref 101–111)
CO2: 27 mmol/L (ref 22–32)
Creatinine, Ser: 5.83 mg/dL — ABNORMAL HIGH (ref 0.44–1.00)
GFR, EST AFRICAN AMERICAN: 7 mL/min — AB (ref >60)
GFR, EST NON AFRICAN AMERICAN: 6 mL/min — AB (ref >60)
Glucose, Bld: 144 mg/dL — ABNORMAL HIGH (ref 65–99)
Potassium: 4.7 mmol/L (ref 3.5–5.1)
Sodium: 128 mmol/L — ABNORMAL LOW (ref 135–145)

## 2016-04-07 LAB — GLUCOSE, CAPILLARY
GLUCOSE-CAPILLARY: 149 mg/dL — AB (ref 65–99)
GLUCOSE-CAPILLARY: 203 mg/dL — AB (ref 65–99)
GLUCOSE-CAPILLARY: 191 mg/dL — AB (ref 65–99)
Glucose-Capillary: 130 mg/dL — ABNORMAL HIGH (ref 65–99)

## 2016-04-07 LAB — CBC
HEMATOCRIT: 37.1 % (ref 35.0–47.0)
HEMOGLOBIN: 12 g/dL (ref 12.0–16.0)
MCH: 31.5 pg (ref 26.0–34.0)
MCHC: 32.4 g/dL (ref 32.0–36.0)
MCV: 97.1 fL (ref 80.0–100.0)
Platelets: 147 10*3/uL — ABNORMAL LOW (ref 150–440)
RBC: 3.82 MIL/uL (ref 3.80–5.20)
RDW: 16.6 % — AB (ref 11.5–14.5)
WBC: 11.4 10*3/uL — AB (ref 3.6–11.0)

## 2016-04-07 LAB — HEMOGLOBIN A1C: HEMOGLOBIN A1C: 6 % (ref 4.0–6.0)

## 2016-04-07 MED ORDER — OXYCODONE-ACETAMINOPHEN 5-325 MG PO TABS
1.0000 | ORAL_TABLET | Freq: Four times a day (QID) | ORAL | Status: DC | PRN
  Administered 2016-04-07 – 2016-04-10 (×6): 1 via ORAL
  Filled 2016-04-07 (×6): qty 1

## 2016-04-07 MED ORDER — SEVELAMER CARBONATE 800 MG PO TABS: 800.0000 mg | ORAL_TABLET | Freq: Two times a day (BID) | ORAL | Status: DC

## 2016-04-07 MED ORDER — HYDROMORPHONE HCL 1 MG/ML IJ SOLN
2.0000 mg | INTRAMUSCULAR | Status: DC | PRN
  Administered 2016-04-07 – 2016-04-08 (×2): 2 mg via INTRAVENOUS
  Administered 2016-04-08: 1 mg via INTRAVENOUS
  Filled 2016-04-07 (×2): qty 2
  Filled 2016-04-07: qty 1

## 2016-04-07 MED ORDER — IPRATROPIUM-ALBUTEROL 0.5-2.5 (3) MG/3ML IN SOLN
3.0000 mL | Freq: Once | RESPIRATORY_TRACT | Status: AC
  Administered 2016-04-07: 3 mL via RESPIRATORY_TRACT

## 2016-04-07 MED ORDER — IPRATROPIUM-ALBUTEROL 0.5-2.5 (3) MG/3ML IN SOLN
3.0000 mL | Freq: Four times a day (QID) | RESPIRATORY_TRACT | Status: DC
  Administered 2016-04-07 – 2016-04-09 (×6): 3 mL via RESPIRATORY_TRACT
  Filled 2016-04-07 (×7): qty 3

## 2016-04-07 MED ORDER — CALCIUM ACETATE (PHOS BINDER) 667 MG PO CAPS
667.0000 mg | ORAL_CAPSULE | Freq: Two times a day (BID) | ORAL | Status: DC
  Administered 2016-04-07 – 2016-04-08 (×2): 667 mg via ORAL
  Filled 2016-04-07: qty 1

## 2016-04-07 NOTE — Care Management (Signed)
Patient from home with her husband. In 2016 we discussed LTAC but both refused. She went to [facility] for rehab at that time. He is on hemodialysis at [facility].  M, W, F at 11:40AM. I have notified Debbie with [facility] of patient's continued stay. RNCM will meet with patient and family.

## 2016-04-07 NOTE — Progress Notes (Signed)
Pre HD assessment  
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
## 2016-04-07 NOTE — Consult Note (Signed)
Pt off floor for dialysis. Xrays reviewed and records reviewed. Pt with calcified artery on plain film significant for athrosclerosis with mildly displaced trimalleolar ankle fracture In setting of DM on dialysis with calcified arteries would be high risk for surgical complications with ORIF. Agree with Non-surgical treatment with complete NWB with well padded cast and close monitoring of skin around any pressure points.  If becomes displaced could consider ORIF or percutaneous stabilization at that time. I

## 2016-04-07 NOTE — NC FL2 (Signed)
[place] LEVEL OF CARE SCREENING TOOL     IDENTIFICATION  Patient Name: [name] Birthdate: Apr 13, 1934 Sex: female Admission Date (Current Location): 04/06/2016  [place] and [place] Number:  Engineering geologist and Address:  [facility], [address], [place], [place] [address]      Provider Number: [id number]  Attending Physician Name and Address:  Gladstone Lighter, MD  Relative Name and Phone Number:       Current Level of Care: Hospital Recommended Level of Care: [place] Prior Approval Number:    Date Approved/Denied:   PASRR Number:  ( [id number] )  Discharge Plan: SNF    Current Diagnoses: Patient Active Problem List   Diagnosis Date Noted  . Hypoxia 04/06/2016  . Healthcare-associated pneumonia 08/14/2015  . Hypoglycemia 08/14/2015  . Hypotension 08/14/2015  . ESRD on hemodialysis ([place]) 08/14/2015  . DM (diabetes mellitus) ([place]) 08/14/2015  . Pleural effusion on right   . Protein-calorie malnutrition, severe ([place]) 08/06/2015  . Community acquired pneumonia   . Pleural effusion 08/03/2015  . Acute respiratory failure ([place]) 08/03/2015  . Cough 08/03/2015  . Atrial fibrillation ([place]) 08/03/2015    Orientation RESPIRATION BLADDER Height & Weight     Self, Time, Situation, Place  O2 (3 Liters Oxygen ) Continent Weight: 215 lb 7.2 oz (97.727 kg) Height:  5\' 2"  (157.5 cm)  BEHAVIORAL SYMPTOMS/MOOD NEUROLOGICAL BOWEL NUTRITION STATUS   (none )  (none ) Continent Diet (Renal Diet )  AMBULATORY STATUS COMMUNICATION OF NEEDS Skin   Extensive Assist Verbally Normal                       Personal Care Assistance Level of Assistance  Bathing, Feeding, Dressing Bathing Assistance: Limited assistance Feeding assistance: Independent Dressing Assistance: Limited assistance     Functional Limitations Info  Sight, Hearing, Speech Sight Info: Adequate Hearing Info: Adequate Speech Info:  Adequate    SPECIAL CARE FACTORS FREQUENCY  PT (By licensed PT), OT (By licensed OT)     PT Frequency:  (5) OT Frequency:  (5)            Contractures      Additional Factors Info  Code Status, Allergies, Isolation Precautions (Dialysis patient M,W,F [facility] [address] 11:40 am) Code Status Info:  (Full Code. ) Allergies Info:  (Angiotensin Receptor Blockers, Penicillins)     Isolation Precautions Info:  (MRSA nasal swab 08/14/2015)     Current Medications (04/07/2016):  This is the current hospital active medication list Current Facility-Administered Medications  Medication Dose Route Frequency Provider Last Rate Last Dose  . acetaminophen (TYLENOL) tablet 650 mg  650 mg Oral Q6H PRN Epifanio Lesches, MD       Or  . acetaminophen (TYLENOL) suppository 650 mg  650 mg Rectal Q6H PRN Epifanio Lesches, MD      . allopurinol (ZYLOPRIM) tablet 100 mg  100 mg Oral Daily Epifanio Lesches, MD   100 mg at 04/06/16 1854  . aspirin EC tablet 81 mg  81 mg Oral Daily Epifanio Lesches, MD   81 mg at 04/06/16 1847  . benzonatate (TESSALON) capsule 100 mg  100 mg Oral TID PRN Epifanio Lesches, MD      . bisacodyl (DULCOLAX) EC tablet 5 mg  5 mg Oral Daily PRN Epifanio Lesches, MD      . calcium acetate (PHOSLO) capsule 1,334 mg  1,334 mg Oral TID WC Epifanio Lesches, MD   [phone]  mg at 04/07/16 1152  . cholecalciferol (VITAMIN D) tablet 1,000 Units  1,000 Units Oral Daily Epifanio Lesches, MD   1,000 Units at 04/06/16 1847  . cinacalcet (SENSIPAR) tablet 30 mg  30 mg Oral Q breakfast Epifanio Lesches, MD   30 mg at 04/07/16 1024  . docusate sodium (COLACE) capsule 100 mg  100 mg Oral BID Epifanio Lesches, MD   100 mg at 04/06/16 2116  . fluticasone (FLONASE) 50 MCG/ACT nasal spray 2 spray  2 spray Each Nare QHS Epifanio Lesches, MD   2 spray at 04/06/16 2116  . heparin injection 5,000 Units  5,000 Units Subcutaneous Q8H Epifanio Lesches, MD   5,000 Units at  04/07/16 0524  . HYDROmorphone (DILAUDID) injection 2 mg  2 mg Intravenous Q4H PRN Gladstone Lighter, MD   2 mg at 04/07/16 1153  . insulin aspart (novoLOG) injection 0-15 Units  0-15 Units Subcutaneous TID WC Epifanio Lesches, MD   5 Units at 04/07/16 1153  . ipratropium-albuterol (DUONEB) 0.5-2.5 (3) MG/3ML nebulizer solution 3 mL  3 mL Nebulization QID Epifanio Lesches, MD   3 mL at 04/07/16 1200  . midodrine (PROAMATINE) tablet 5 mg  5 mg Oral TID WC Epifanio Lesches, MD   5 mg at 04/06/16 1929  . multivitamin with minerals tablet   Oral Daily Epifanio Lesches, MD      . mupirocin ointment (BACTROBAN) 2 %   Nasal BID Epifanio Lesches, MD      . ondansetron (ZOFRAN) tablet 4 mg  4 mg Oral Q6H PRN Epifanio Lesches, MD       Or  . ondansetron (ZOFRAN) injection 4 mg  4 mg Intravenous Q6H PRN Epifanio Lesches, MD      . oxyCODONE-acetaminophen (PERCOCET/ROXICET) 5-325 MG per tablet 1 tablet  1 tablet Oral Q6H PRN Gladstone Lighter, MD      . pantoprazole (PROTONIX) EC tablet 40 mg  40 mg Oral Daily Epifanio Lesches, MD   40 mg at 04/06/16 1845  . propranolol (INDERAL) tablet 80 mg  80 mg Oral BID Gladstone Lighter, MD   Stopped at 04/06/16 2119  . sevelamer carbonate (RENVELA) tablet 1,600 mg  1,600 mg Oral TID WC Epifanio Lesches, MD   1,600 mg at 04/07/16 1235  . sodium chloride flush (NS) 0.9 % injection 3 mL  3 mL Intravenous Q12H Epifanio Lesches, MD   3 mL at 04/07/16 1146  . traZODone (DESYREL) tablet 25 mg  25 mg Oral QHS PRN Epifanio Lesches, MD         Discharge Medications: Please see discharge summary for a list of discharge medications.  Relevant Imaging Results:  Relevant Lab Results:   Additional Information  (SSN: [id number])  Loralyn Freshwater, LCSW

## 2016-04-07 NOTE — Progress Notes (Signed)
PRE HD   
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
## 2016-04-07 NOTE — Progress Notes (Signed)
Clinical Education officer, museum (CSW) presented bed offers to patient. [facility] asked CSW to call her husband. CSW contacted patient's husband Garnet Koyanagi and presented bed offers. Per husband he will discuss offers with patient and get back to CSW. CSW will continue to follow and assist as needed.   Blima Rich, LCSW [phone]

## 2016-04-07 NOTE — Progress Notes (Signed)
TX started 

## 2016-04-07 NOTE — Care Management (Signed)
   Patient suffers from respiratory distress/rib fractures/leg fractures and has trouble breathing at night when head is elevated less than 30 degrees degrees. Bed wedges do not provide enough elevation to resolve breathing issues.  Pain and shortness of breath cause patient to require frequent and immediate  changes in body  position which cannot be achieved with a normal bed. Semi-electric hospital bed requested from [facility].
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
## 2016-04-07 NOTE — Care Management (Addendum)
Referral faxed to [facility] for review (fax [phone]. They received fax and will review. EMS packet started.

## 2016-04-07 NOTE — Clinical Social Work Placement (Signed)
   CLINICAL SOCIAL WORK PLACEMENT  NOTE  Date:  04/07/2016  Patient Details  Name: Jenavie Arango MRN: [id number] Date of Birth: 1934-10-31  Clinical Social Work is seeking post-discharge placement for this patient at the [facility] level of care (*CSW will initial, date and re-position this form in  chart as items are completed):  Yes   Patient/family provided with [facility] Work Department's list of facilities offering this level of care within the geographic area requested by the patient (or if unable, by the patient's family).  Yes   Patient/family informed of their freedom to choose among providers that offer the needed level of care, that participate in Medicare, Medicaid or managed care program needed by the patient, have an available bed and are willing to accept the patient.  Yes   Patient/family informed of [facility]'s ownership interest in [facility] and [facility], as well as of the fact that they are under no obligation to receive care at these facilities.  PASRR submitted to EDS on       PASRR number received on       Existing PASRR number confirmed on 04/07/16     FL2 transmitted to all facilities in geographic area requested by pt/family on 04/07/16     FL2 transmitted to all facilities within larger geographic area on       Patient informed that his/her managed care company has contracts with or will negotiate with certain facilities, including the following:            Patient/family informed of bed offers received.  Patient chooses bed at       Physician recommends and patient chooses bed at      Patient to be transferred to   on  .  Patient to be transferred to facility by       Patient family notified on   of transfer.  Name of family member notified:        PHYSICIAN       Additional Comment:    _______________________________________________ Loralyn Freshwater, LCSW 04/07/2016, 2:24 PM

## 2016-04-08 LAB — GLUCOSE, CAPILLARY
GLUCOSE-CAPILLARY: 190 mg/dL — AB (ref 65–99)
Glucose-Capillary: 241 mg/dL — ABNORMAL HIGH (ref 65–99)
Glucose-Capillary: 241 mg/dL — ABNORMAL HIGH (ref 65–99)
Glucose-Capillary: 219 mg/dL — ABNORMAL HIGH (ref 65–99)

## 2016-04-08 LAB — BASIC METABOLIC PANEL
ANION GAP: 13 (ref 5–15)
BUN: 24 mg/dL — AB (ref 6–20)
CALCIUM: 9.3 mg/dL (ref 8.9–10.3)
CO2: 28 mmol/L (ref 22–32)
Chloride: 94 mmol/L — ABNORMAL LOW (ref 101–111)
Creatinine, Ser: 4.28 mg/dL — ABNORMAL HIGH (ref 0.44–1.00)
GFR calc Af Amer: 10 mL/min — ABNORMAL LOW (ref >60)
GFR, EST NON AFRICAN AMERICAN: 9 mL/min — AB (ref >60)
Glucose, Bld: 213 mg/dL — ABNORMAL HIGH (ref 65–99)
POTASSIUM: 4.6 mmol/L (ref 3.5–5.1)
SODIUM: 135 mmol/L (ref 135–145)

## 2016-04-08 MED ORDER — INSULIN ASPART 100 UNIT/ML ~~LOC~~ SOLN
0.0000 [IU] | Freq: Every day | SUBCUTANEOUS | Status: DC
  Administered 2016-04-08: 2 [IU] via SUBCUTANEOUS
  Filled 2016-04-08: qty 2

## 2016-04-08 MED ORDER — SODIUM CHLORIDE 0.9 % IV BOLUS (SEPSIS)
250.0000 mL | Freq: Once | INTRAVENOUS | Status: AC
  Administered 2016-04-08: 250 mL via INTRAVENOUS

## 2016-04-08 MED ORDER — INSULIN ASPART 100 UNIT/ML ~~LOC~~ SOLN
0.0000 [IU] | Freq: Three times a day (TID) | SUBCUTANEOUS | Status: DC
  Administered 2016-04-08 (×2): 5 [IU] via SUBCUTANEOUS
  Administered 2016-04-10 (×2): 3 [IU] via SUBCUTANEOUS
  Filled 2016-04-08 (×2): qty 3
  Filled 2016-04-08 (×2): qty 5

## 2016-04-08 MED ORDER — POLYETHYLENE GLYCOL 3350 17 G PO PACK: 17.0000 g | PACK | Freq: Every day | ORAL | Status: DC | PRN

## 2016-04-08 MED ORDER — INSULIN GLARGINE 100 UNIT/ML ~~LOC~~ SOLN
10.0000 [IU] | Freq: Every day | SUBCUTANEOUS | Status: DC
  Administered 2016-04-08 – 2016-04-09 (×2): 10 [IU] via SUBCUTANEOUS
  Filled 2016-04-08 (×3): qty 0.1
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
## 2016-04-08 NOTE — Progress Notes (Signed)
Clinical Education officer, museum (CSW) met with patient alone at bedside. CSW discussed SNF option. Patient reported that she discussed it with her husband and they have decided that patient will go home with home health. CSW attempted to contact patient's husband Garnet Koyanagi to confirm plan however he did not answer and a voicemail was left. CSW will continue to follow and assist as needed.   Blima Rich, LCSW [phone]

## 2016-04-08 NOTE — Progress Notes (Signed)
Short leg cast applied, skin intact with no ulcerations

## 2016-04-08 NOTE — Progress Notes (Addendum)
Spoke with Candy Sledge about blood pressure 78/38, patient is A+O with no signs of distress. MD advised to hold metoprolol and blood pressure meds. Will cont. To monitor

## 2016-04-08 NOTE — Care Management (Signed)
Patient O2 sats below 88% at rest on room air however I do not see a chronic lung disease. I am checking with MD. Referral made to [facility] with [facility]. Secondary insurance has discontinued. Cost for hospital bed was shared with patient's husband today: $20.64/month and $22.71 one time cost for gel overlay.

## 2016-04-08 NOTE — Progress Notes (Signed)
SATURATION QUALIFICATIONS:   Patient Saturations on Room Air at Rest =87 %  Patient Saturations on 93 % 2L O2  Please briefly explain why patient needs home oxygen: decreased O2 on room air/ CHF
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
## 2016-04-08 NOTE — Progress Notes (Signed)
Pts BP 80/43. MD [facility] notified. Orders received for NS fluid bolus 250 ml/3hrs. Order placed.

## 2016-04-08 NOTE — Progress Notes (Signed)
Clinical Social Worker (CSW) met with patient and her husband Garnet Koyanagi was at bedside. CSW discussed SNF option for the third time. Patient and husband continued to refuse SNF. Husband reported that he is taking patient home and wants a lift. RN Case Manager aware of above. CSW will continue to follow and assist as needed.   Blima Rich, LCSW [phone]
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
## 2016-04-08 NOTE — Care Management Important Message (Signed)
Important Message  Patient Details  Name: [name] MRN: [id number] Date of Birth: 1934/05/11   Medicare Important Message Given:  Yes    Juliann Pulse A Antigone Crowell 04/08/2016, 10:17 AM

## 2016-04-09 LAB — GLUCOSE, CAPILLARY
GLUCOSE-CAPILLARY: 160 mg/dL — AB (ref 65–99)
GLUCOSE-CAPILLARY: 139 mg/dL — AB (ref 65–99)
Glucose-Capillary: 127 mg/dL — ABNORMAL HIGH (ref 65–99)
Glucose-Capillary: 158 mg/dL — ABNORMAL HIGH (ref 65–99)

## 2016-04-09 MED ORDER — IPRATROPIUM-ALBUTEROL 0.5-2.5 (3) MG/3ML IN SOLN
3.0000 mL | Freq: Two times a day (BID) | RESPIRATORY_TRACT | Status: DC
  Administered 2016-04-09 – 2016-04-10 (×2): 3 mL via RESPIRATORY_TRACT
  Filled 2016-04-09 (×2): qty 3

## 2016-04-09 MED ORDER — SENNA 8.6 MG PO TABS
2.0000 | ORAL_TABLET | Freq: Every day | ORAL | Status: DC
  Administered 2016-04-09 – 2016-04-10 (×2): 17.2 mg via ORAL
  Filled 2016-04-09 (×2): qty 2

## 2016-04-09 MED ORDER — BISACODYL 5 MG PO TBEC
10.0000 mg | DELAYED_RELEASE_TABLET | Freq: Every day | ORAL | Status: DC | PRN
Start: 2016-04-09 — End: 2016-04-10
  Administered 2016-04-09: 10 mg via ORAL
  Filled 2016-04-09 (×2): qty 2

## 2016-04-09 NOTE — Care Management (Signed)
[facility]oyer lift Rx requested from [employer]. Referral to [place].

## 2016-04-09 NOTE — Care Management Important Message (Signed)
Important Message  Patient Details  Name: [name] MRN: [id number] Date of Birth: November 28, 1934   Medicare Important Message Given:  Yes    Kaliel Bolds A, RN 04/09/2016, 7:38 AM

## 2016-04-09 NOTE — Progress Notes (Signed)
Pre dialysis assessment 

## 2016-04-09 NOTE — Progress Notes (Signed)
Post dialysis assessment 
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
## 2016-04-09 NOTE — Care Management Note (Addendum)
Case Management Note  Patient Details  Name: Milene Bakker MRN: [id number] Date of Birth: [birth year]  Subjective/Objective:     Hospital bed and a hoyer lift were delivered to Mrs Tinkham's home today by [facility]. [facility] reports that they do not have enough staff to accept Mrs Heller for home health services. This Probation officer left a message for Mr Greenan on their home phone, and told him that a referral was sent to [facility] who do have the staffing to provide home health PT and RN services, and that a referral for home health PT and RN has been sent to [facility] via [facility]. Mr Boden called back and reported that he agreed with this plan. Current plan is for discharge home tomorrow.               Action/Plan:   Expected Discharge Date:                  Expected Discharge Plan:     In-House Referral:     Discharge planning Services  CM Consult  Post Acute Care Choice:  Home Health Choice offered to:  Patient, Spouse  DME Arranged:    DME Agency:     HH Arranged:    [place]:  [place]  Status of Service:  In process, will continue to follow  Medicare Important Message Given:  Yes Date Medicare IM Given:    Medicare IM give by:    Date Additional Medicare IM Given:    Additional Medicare Important Message give by:     If discussed at [place] of Stay Meetings, dates discussed:    Additional Comments:  Musa Rewerts A, RN 04/09/2016, 2:23 PM

## 2016-04-09 NOTE — Progress Notes (Signed)
Inpatient Diabetes Program Recommendations  AACE/ADA: New Consensus Statement on Inpatient Glycemic Control (2015)  Target Ranges:  Prepandial:   less than 140 mg/dL      Peak postprandial:   less than 180 mg/dL (1-2 hours)      Critically ill patients:  140 - 180 mg/dL  Results for [name], [name] (MRN [id number]) as of 04/09/2016 07:57  Ref. Range 04/08/2016 07:32 04/08/2016 10:59 04/08/2016 16:30 04/08/2016 20:48 04/09/2016 07:31  Glucose-Capillary Latest Ref Range: 65-99 mg/dL 190 (H) 241 (H) 241 (H) 219 (H) 160 (H)   Review of Glycemic Control  Diabetes history: DM2 Outpatient Diabetes medications: Humalog 75/25 50 units BID Current orders for Inpatient glycemic control: Novolog 0-15 units TID with meals, Novolog 0-5 units QHS, Lantus 10 units QHS  Inpatient Diabetes Program Recommendations: Insulin - Meal Coverage: Post prandial glucose is consistently elevated. Please consider ordering Novolog 5 units TID with meals for meal coverage if patient eats at least 50% of meals.  Thanks, Barnie Alderman, RN, MSN, CDE Diabetes Coordinator Inpatient Diabetes Program [phone] (Team Pager from [place] to [place]) [phone] ([place] office) [phone] [facility] office) [phone] [facility] office)

## 2016-04-10 LAB — CBC
HCT: 33.4 % — ABNORMAL LOW (ref 35.0–47.0)
Hemoglobin: 10.9 g/dL — ABNORMAL LOW (ref 12.0–16.0)
MCH: 31.9 pg (ref 26.0–34.0)
MCHC: 32.8 g/dL (ref 32.0–36.0)
MCV: 97.2 fL (ref 80.0–100.0)
PLATELETS: 148 10*3/uL — AB (ref 150–440)
RBC: 3.43 MIL/uL — AB (ref 3.80–5.20)
RDW: 16.5 % — ABNORMAL HIGH (ref 11.5–14.5)
WBC: 10.2 10*3/uL (ref 3.6–11.0)

## 2016-04-10 LAB — GLUCOSE, CAPILLARY
GLUCOSE-CAPILLARY: 151 mg/dL — AB (ref 65–99)
Glucose-Capillary: 159 mg/dL — ABNORMAL HIGH (ref 65–99)
Glucose-Capillary: 158 mg/dL — ABNORMAL HIGH (ref 65–99)

## 2016-04-10 MED ORDER — CALCIUM ACETATE (PHOS BINDER) 667 MG PO CAPS: 667.0000 mg | ORAL_CAPSULE | Freq: Two times a day (BID) | ORAL | Status: DC

## 2016-04-10 MED ORDER — DOCUSATE SODIUM 100 MG PO CAPS: 100.0000 mg | ORAL_CAPSULE | Freq: Two times a day (BID) | ORAL | Status: DC

## 2016-04-10 MED ORDER — OXYCODONE-ACETAMINOPHEN 5-325 MG PO TABS: 1.0000 | ORAL_TABLET | Freq: Four times a day (QID) | ORAL | Status: DC | PRN

## 2016-04-10 MED ORDER — SENNA 8.6 MG PO TABS: 2.0000 | ORAL_TABLET | Freq: Every day | ORAL | Status: DC | PRN

## 2016-04-10 NOTE — Care Management Note (Signed)
Case Management Note  Patient Details  Name: [name] MRN: [id number] Date of Birth: [birth year]  Subjective/Objective:   [name] is at the [place] residence and the home oxygen is now set up. EMS has been called to transport [name] from [facility] to home.                  Action/Plan:   Expected Discharge Date:                  Expected Discharge Plan:     In-House Referral:     Discharge planning Services  CM Consult  Post Acute Care Choice:  Home Health Choice offered to:  Patient, Spouse  DME Arranged:    DME Agency:     HH Arranged:    [place]:  [place]  Status of Service:  In process, will continue to follow  Medicare Important Message Given:  Yes Date Medicare IM Given:    Medicare IM give by:    Date Additional Medicare IM Given:    Additional Medicare Important Message give by:     If discussed at [place] of Stay Meetings, dates discussed:    Additional Comments:  Glenda Kunst A, RN 04/10/2016, 4:09 PM

## 2016-04-10 NOTE — Discharge Instructions (Signed)
No weight bearing on left leg for 2-3 weeks- UNTIL instructed by orthopedics doctor.

## 2016-04-10 NOTE — Progress Notes (Signed)
SATURATION QUALIFICATIONS:   Patient Saturations on Room Air at Rest = 84 %   Patient Saturations on 2L O2 95 %   Please briefly explain why patient needs home oxygen: decreased O2 on room air/ CHF

## 2016-04-10 NOTE — Care Management Note (Signed)
Case Management Note  Patient Details  Name: [name] MRN: [id number] Date of Birth: [birth year]  Subjective/Objective:         [name] qualified for home oxygen per her room air at rest Sat today was 84%. A referral for new home oxygen was called to [facility] at [place]. [name] will be transported home today via EMS so both the portable oxygen tank and the home oxygen set up are being delivered to the [place] residence. The [employer] oxygen driver will notify this Probation officer as soon as the home oxygen is delivered so that EMS can be notified to transport [name] home.  A referral for home health RN and PT has been sent to [facility] requesting home health RN and PT services. A hoyer lift and a hospital bed were delivered to the [place] residence on 04/09/16 by [place].            Action/Plan:   Expected Discharge Date:                  Expected Discharge Plan:     In-House Referral:     Discharge planning Services  CM Consult  Post Acute Care Choice:  Home Health Choice offered to:  Patient, Spouse  DME Arranged:    DME Agency:     HH Arranged:    [place]:  [place]  Status of Service:  In process, will continue to follow  Medicare Important Message Given:  Yes Date Medicare IM Given:    Medicare IM give by:    Date Additional Medicare IM Given:    Additional Medicare Important Message give by:     If discussed at [place] of Stay Meetings, dates discussed:    Additional Comments:  Carolyn Maniscalco A, RN 04/10/2016, 1:44 PM
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
## 2016-09-09 ENCOUNTER — Emergency Department: Payer: Medicare Other

## 2016-09-09 ENCOUNTER — Other Ambulatory Visit: Payer: Self-pay

## 2016-09-09 ENCOUNTER — Encounter: Payer: Self-pay | Admitting: Emergency Medicine

## 2016-09-09 ENCOUNTER — Inpatient Hospital Stay
Admission: EM | Admit: 2016-09-09 | Discharge: 2016-09-21 | DRG: 871 | Disposition: A | Discharge status: Skilled Nursing Facility | Payer: Medicare Other | Attending: Internal Medicine | Admitting: Internal Medicine

## 2016-09-09 DIAGNOSIS — I4891 Unspecified atrial fibrillation: Secondary | ICD-10-CM | POA: Diagnosis not present

## 2016-09-09 DIAGNOSIS — E875 Hyperkalemia: Secondary | ICD-10-CM | POA: Diagnosis present

## 2016-09-09 DIAGNOSIS — R531 Weakness: Secondary | ICD-10-CM

## 2016-09-09 DIAGNOSIS — J9601 Acute respiratory failure with hypoxia: Secondary | ICD-10-CM | POA: Diagnosis not present

## 2016-09-09 DIAGNOSIS — S72424A Nondisplaced fracture of lateral condyle of right femur, initial encounter for closed fracture: Secondary | ICD-10-CM

## 2016-09-09 DIAGNOSIS — E119 Type 2 diabetes mellitus without complications: Secondary | ICD-10-CM

## 2016-09-09 DIAGNOSIS — I482 Chronic atrial fibrillation: Secondary | ICD-10-CM | POA: Diagnosis present

## 2016-09-09 DIAGNOSIS — G250 Essential tremor: Secondary | ICD-10-CM | POA: Diagnosis present

## 2016-09-09 DIAGNOSIS — Z888 Allergy status to other drugs, medicaments and biological substances status: Secondary | ICD-10-CM

## 2016-09-09 DIAGNOSIS — Z515 Encounter for palliative care: Secondary | ICD-10-CM

## 2016-09-09 DIAGNOSIS — I132 Hypertensive heart and chronic kidney disease with heart failure and with stage 5 chronic kidney disease, or end stage renal disease: Secondary | ICD-10-CM | POA: Diagnosis present

## 2016-09-09 DIAGNOSIS — M109 Gout, unspecified: Secondary | ICD-10-CM | POA: Diagnosis present

## 2016-09-09 DIAGNOSIS — E669 Obesity, unspecified: Secondary | ICD-10-CM | POA: Diagnosis present

## 2016-09-09 DIAGNOSIS — N186 End stage renal disease: Secondary | ICD-10-CM | POA: Diagnosis present

## 2016-09-09 DIAGNOSIS — N179 Acute kidney failure, unspecified: Secondary | ICD-10-CM | POA: Diagnosis present

## 2016-09-09 DIAGNOSIS — D631 Anemia in chronic kidney disease: Secondary | ICD-10-CM | POA: Diagnosis present

## 2016-09-09 DIAGNOSIS — G4733 Obstructive sleep apnea (adult) (pediatric): Secondary | ICD-10-CM | POA: Diagnosis present

## 2016-09-09 DIAGNOSIS — J189 Pneumonia, unspecified organism: Secondary | ICD-10-CM | POA: Diagnosis present

## 2016-09-09 DIAGNOSIS — Z992 Dependence on renal dialysis: Secondary | ICD-10-CM

## 2016-09-09 DIAGNOSIS — R6521 Severe sepsis with septic shock: Secondary | ICD-10-CM | POA: Diagnosis present

## 2016-09-09 DIAGNOSIS — E1122 Type 2 diabetes mellitus with diabetic chronic kidney disease: Secondary | ICD-10-CM | POA: Diagnosis present

## 2016-09-09 DIAGNOSIS — N2581 Secondary hyperparathyroidism of renal origin: Secondary | ICD-10-CM | POA: Diagnosis present

## 2016-09-09 DIAGNOSIS — D696 Thrombocytopenia, unspecified: Secondary | ICD-10-CM | POA: Diagnosis present

## 2016-09-09 DIAGNOSIS — Z6841 Body Mass Index (BMI) 40.0 and over, adult: Secondary | ICD-10-CM

## 2016-09-09 DIAGNOSIS — J9600 Acute respiratory failure, unspecified whether with hypoxia or hypercapnia: Secondary | ICD-10-CM

## 2016-09-09 DIAGNOSIS — Z794 Long term (current) use of insulin: Secondary | ICD-10-CM

## 2016-09-09 DIAGNOSIS — J9621 Acute and chronic respiratory failure with hypoxia: Secondary | ICD-10-CM | POA: Diagnosis present

## 2016-09-09 DIAGNOSIS — Z66 Do not resuscitate: Secondary | ICD-10-CM | POA: Diagnosis present

## 2016-09-09 DIAGNOSIS — A419 Sepsis, unspecified organism: Principal | ICD-10-CM | POA: Diagnosis present

## 2016-09-09 DIAGNOSIS — E861 Hypovolemia: Secondary | ICD-10-CM | POA: Diagnosis present

## 2016-09-09 DIAGNOSIS — R0902 Hypoxemia: Secondary | ICD-10-CM

## 2016-09-09 DIAGNOSIS — Z22322 Carrier or suspected carrier of Methicillin resistant Staphylococcus aureus: Secondary | ICD-10-CM

## 2016-09-09 DIAGNOSIS — I959 Hypotension, unspecified: Secondary | ICD-10-CM | POA: Diagnosis not present

## 2016-09-09 DIAGNOSIS — K219 Gastro-esophageal reflux disease without esophagitis: Secondary | ICD-10-CM | POA: Diagnosis present

## 2016-09-09 DIAGNOSIS — M6281 Muscle weakness (generalized): Secondary | ICD-10-CM

## 2016-09-09 DIAGNOSIS — E871 Hypo-osmolality and hyponatremia: Secondary | ICD-10-CM | POA: Diagnosis present

## 2016-09-09 DIAGNOSIS — I5021 Acute systolic (congestive) heart failure: Secondary | ICD-10-CM | POA: Diagnosis present

## 2016-09-09 DIAGNOSIS — I272 Pulmonary hypertension, unspecified: Secondary | ICD-10-CM | POA: Diagnosis present

## 2016-09-09 DIAGNOSIS — R262 Difficulty in walking, not elsewhere classified: Secondary | ICD-10-CM

## 2016-09-09 DIAGNOSIS — W19XXXA Unspecified fall, initial encounter: Secondary | ICD-10-CM | POA: Diagnosis present

## 2016-09-09 DIAGNOSIS — Z452 Encounter for adjustment and management of vascular access device: Secondary | ICD-10-CM

## 2016-09-09 DIAGNOSIS — Z833 Family history of diabetes mellitus: Secondary | ICD-10-CM

## 2016-09-09 DIAGNOSIS — Z79899 Other long term (current) drug therapy: Secondary | ICD-10-CM

## 2016-09-09 DIAGNOSIS — G9341 Metabolic encephalopathy: Secondary | ICD-10-CM | POA: Diagnosis present

## 2016-09-09 DIAGNOSIS — M9711XA Periprosthetic fracture around internal prosthetic right knee joint, initial encounter: Secondary | ICD-10-CM | POA: Diagnosis present

## 2016-09-09 DIAGNOSIS — Z85828 Personal history of other malignant neoplasm of skin: Secondary | ICD-10-CM

## 2016-09-09 DIAGNOSIS — Z880 Allergy status to penicillin: Secondary | ICD-10-CM

## 2016-09-09 DIAGNOSIS — R600 Localized edema: Secondary | ICD-10-CM

## 2016-09-09 LAB — CBC
HEMATOCRIT: 35.2 % (ref 35.0–47.0)
Hemoglobin: 11.8 g/dL — ABNORMAL LOW (ref 12.0–16.0)
MCH: 34 pg (ref 26.0–34.0)
MCHC: 33.5 g/dL (ref 32.0–36.0)
MCV: 101.3 fL — AB (ref 80.0–100.0)
PLATELETS: 159 10*3/uL (ref 150–440)
RBC: 3.47 MIL/uL — AB (ref 3.80–5.20)
RDW: 15.7 % — ABNORMAL HIGH (ref 11.5–14.5)
WBC: 15.2 10*3/uL — AB (ref 3.6–11.0)

## 2016-09-09 LAB — COMPREHENSIVE METABOLIC PANEL
ALBUMIN: 3.2 g/dL — AB (ref 3.5–5.0)
ALT: 13 U/L — AB (ref 14–54)
AST: 40 U/L (ref 15–41)
Alkaline Phosphatase: 260 U/L — ABNORMAL HIGH (ref 38–126)
Anion gap: 7 (ref 5–15)
BILIRUBIN TOTAL: 1.9 mg/dL — AB (ref 0.3–1.2)
BUN: 41 mg/dL — AB (ref 6–20)
CHLORIDE: 95 mmol/L — AB (ref 101–111)
CO2: 23 mmol/L (ref 22–32)
CREATININE: 4.11 mg/dL — AB (ref 0.44–1.00)
Calcium: 8.8 mg/dL — ABNORMAL LOW (ref 8.9–10.3)
GFR calc Af Amer: 11 mL/min — ABNORMAL LOW (ref >60)
GFR, EST NON AFRICAN AMERICAN: 9 mL/min — AB (ref >60)
GLUCOSE: 153 mg/dL — AB (ref 65–99)
Potassium: 5.6 mmol/L — ABNORMAL HIGH (ref 3.5–5.1)
Sodium: 125 mmol/L — ABNORMAL LOW (ref 135–145)
Total Protein: 7.1 g/dL (ref 6.5–8.1)

## 2016-09-09 LAB — TROPONIN I: TROPONIN I: 0.03 ng/mL — AB (ref <0.03)

## 2016-09-09 MED ORDER — TRAMADOL HCL 50 MG PO TABS
50.0000 mg | ORAL_TABLET | Freq: Three times a day (TID) | ORAL | Status: DC | PRN
Start: 2016-09-09 — End: 2016-09-11
  Administered 2016-09-09: 50 mg via ORAL
  Filled 2016-09-09 (×2): qty 1

## 2016-09-09 MED ORDER — FENTANYL CITRATE (PF) 100 MCG/2ML IJ SOLN
75.0000 ug | Freq: Once | INTRAMUSCULAR | Status: AC
  Administered 2016-09-09: 75 ug via INTRAVENOUS

## 2016-09-09 MED ORDER — FENTANYL CITRATE (PF) 100 MCG/2ML IJ SOLN
75.0000 ug | Freq: Once | INTRAMUSCULAR | Status: AC
  Administered 2016-09-09: 75 ug via INTRAVENOUS
  Filled 2016-09-09: qty 2

## 2016-09-09 MED ORDER — FENTANYL CITRATE (PF) 100 MCG/2ML IJ SOLN
INTRAMUSCULAR | Status: AC
  Filled 2016-09-09: qty 2

## 2016-09-09 MED ORDER — SODIUM CHLORIDE 0.9 % IV BOLUS (SEPSIS)
500.0000 mL | Freq: Once | INTRAVENOUS | Status: AC
  Administered 2016-09-09: 500 mL via INTRAVENOUS
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
## 2016-09-09 NOTE — ED Notes (Signed)
Patient transported to X-ray 

## 2016-09-09 NOTE — ED Notes (Signed)
Dr. Kerman Passey notified of troponin of 0.03

## 2016-09-09 NOTE — ED Notes (Signed)
MD Paduchoski at bedside.
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
## 2016-09-09 NOTE — ED Notes (Signed)
Ice provided per patients request.

## 2016-09-10 ENCOUNTER — Inpatient Hospital Stay: Payer: Medicare Other

## 2016-09-10 ENCOUNTER — Encounter: Payer: Self-pay | Admitting: Radiology

## 2016-09-10 ENCOUNTER — Inpatient Hospital Stay
Admit: 2016-09-10 | Discharge: 2016-09-10 | Disposition: A | Discharge status: Home / Self Care | Payer: Medicare Other | Attending: Critical Care Medicine | Admitting: Critical Care Medicine

## 2016-09-10 DIAGNOSIS — R6521 Severe sepsis with septic shock: Secondary | ICD-10-CM

## 2016-09-10 DIAGNOSIS — A419 Sepsis, unspecified organism: Principal | ICD-10-CM

## 2016-09-10 LAB — COMPREHENSIVE METABOLIC PANEL
ALBUMIN: 3.2 g/dL — AB (ref 3.5–5.0)
ALK PHOS: 245 U/L — AB (ref 38–126)
ALT: 14 U/L (ref 14–54)
ANION GAP: 14 (ref 5–15)
AST: 37 U/L (ref 15–41)
BUN: 45 mg/dL — ABNORMAL HIGH (ref 6–20)
CALCIUM: 8.8 mg/dL — AB (ref 8.9–10.3)
CO2: 14 mmol/L — AB (ref 22–32)
CREATININE: 4.31 mg/dL — AB (ref 0.44–1.00)
Chloride: 97 mmol/L — ABNORMAL LOW (ref 101–111)
GFR calc Af Amer: 10 mL/min — ABNORMAL LOW (ref >60)
GFR calc non Af Amer: 9 mL/min — ABNORMAL LOW (ref >60)
GLUCOSE: 214 mg/dL — AB (ref 65–99)
Potassium: 6.4 mmol/L (ref 3.5–5.1)
SODIUM: 125 mmol/L — AB (ref 135–145)
Total Bilirubin: 2.2 mg/dL — ABNORMAL HIGH (ref 0.3–1.2)
Total Protein: 7.2 g/dL (ref 6.5–8.1)

## 2016-09-10 LAB — BLOOD GAS, ARTERIAL
ACID-BASE DEFICIT: 0.7 mmol/L (ref 0.0–2.0)
ACID-BASE EXCESS: 0.5 mmol/L (ref 0.0–2.0)
BICARBONATE: 25.4 mmol/L (ref 20.0–28.0)
BICARBONATE: 23.2 mmol/L (ref 20.0–28.0)
Delivery systems: POSITIVE
Expiratory PAP: 8
FIO2: 0.32
FIO2: 1
INSPIRATORY PAP: 14
Mechanical Rate: 8
O2 SAT: 99.8 %
O2 Saturation: 87 %
PCO2 ART: 41 mmHg (ref 32.0–48.0)
PH ART: 7.4 (ref 7.350–7.450)
PO2 ART: 230 mmHg — AB (ref 83.0–108.0)
Patient temperature: 37
Patient temperature: 37
pCO2 arterial: 35 mmHg (ref 32.0–48.0)
pH, Arterial: 7.43 (ref 7.350–7.450)
pO2, Arterial: 53 mmHg — ABNORMAL LOW (ref 83.0–108.0)

## 2016-09-10 LAB — CBC
HCT: 38.3 % (ref 35.0–47.0)
HEMOGLOBIN: 12.2 g/dL (ref 12.0–16.0)
MCH: 33.4 pg (ref 26.0–34.0)
MCHC: 31.8 g/dL — ABNORMAL LOW (ref 32.0–36.0)
MCV: 104.9 fL — ABNORMAL HIGH (ref 80.0–100.0)
Platelets: 148 10*3/uL — ABNORMAL LOW (ref 150–440)
RBC: 3.65 MIL/uL — AB (ref 3.80–5.20)
RDW: 15.8 % — ABNORMAL HIGH (ref 11.5–14.5)
WBC: 16.2 10*3/uL — ABNORMAL HIGH (ref 3.6–11.0)

## 2016-09-10 LAB — BLOOD GAS, VENOUS
ACID-BASE EXCESS: 3.4 mmol/L — AB (ref 0.0–2.0)
BICARBONATE: 29.5 mmol/L — AB (ref 20.0–28.0)
FIO2: 0.4
O2 SAT: 59.4 %
PCO2 VEN: 51 mmHg (ref 44.0–60.0)
Patient temperature: 37
pH, Ven: 7.37 (ref 7.250–7.430)
pO2, Ven: 32 mmHg (ref 32.0–45.0)

## 2016-09-10 LAB — TROPONIN I
TROPONIN I: 0.07 ng/mL — AB (ref <0.03)
Troponin I: 0.03 ng/mL (ref <0.03)
Troponin I: 0.2 ng/mL (ref <0.03)

## 2016-09-10 LAB — LACTIC ACID, PLASMA
LACTIC ACID, VENOUS: 5.3 mmol/L — AB (ref 0.5–1.9)
Lactic Acid, Venous: 4.1 mmol/L (ref 0.5–1.9)

## 2016-09-10 LAB — GLUCOSE, CAPILLARY
GLUCOSE-CAPILLARY: 155 mg/dL — AB (ref 65–99)
Glucose-Capillary: 159 mg/dL — ABNORMAL HIGH (ref 65–99)
Glucose-Capillary: 245 mg/dL — ABNORMAL HIGH (ref 65–99)
Glucose-Capillary: 183 mg/dL — ABNORMAL HIGH (ref 65–99)
Glucose-Capillary: 129 mg/dL — ABNORMAL HIGH (ref 65–99)
Glucose-Capillary: 85 mg/dL (ref 65–99)

## 2016-09-10 LAB — PHOSPHORUS
PHOSPHORUS: 5.7 mg/dL — AB (ref 2.5–4.6)
Phosphorus: 4.1 mg/dL (ref 2.5–4.6)

## 2016-09-10 LAB — APTT: aPTT: 44 seconds — ABNORMAL HIGH (ref 24–36)

## 2016-09-10 LAB — PROCALCITONIN: Procalcitonin: <0.1 ng/mL

## 2016-09-10 LAB — POTASSIUM
Potassium: 2.6 mmol/L — CL (ref 3.5–5.1)
Potassium: 3.6 mmol/L (ref 3.5–5.1)

## 2016-09-10 LAB — FIBRIN DERIVATIVES D-DIMER (ARMC ONLY): Fibrin derivatives D-dimer (ARMC): >7500 — ABNORMAL HIGH (ref 0–499)

## 2016-09-10 LAB — PROTIME-INR
INR: 1.24
PROTHROMBIN TIME: 15.7 s — AB (ref 11.4–15.2)

## 2016-09-10 LAB — HEPARIN LEVEL (UNFRACTIONATED): HEPARIN UNFRACTIONATED: 0.52 [IU]/mL (ref 0.30–0.70)

## 2016-09-10 LAB — MAGNESIUM: Magnesium: 1.8 mg/dL (ref 1.7–2.4)

## 2016-09-10 LAB — MRSA PCR SCREENING: MRSA BY PCR: POSITIVE — AB

## 2016-09-10 MED ORDER — SODIUM CHLORIDE 0.9 % IV SOLN: 250.0000 mL | INTRAVENOUS | Status: DC | PRN

## 2016-09-10 MED ORDER — DOCUSATE SODIUM 100 MG PO CAPS
100.0000 mg | ORAL_CAPSULE | Freq: Two times a day (BID) | ORAL | Status: DC
  Administered 2016-09-10 – 2016-09-15 (×9): 100 mg via ORAL
  Filled 2016-09-10 (×9): qty 1

## 2016-09-10 MED ORDER — ALLOPURINOL 100 MG PO TABS
100.0000 mg | ORAL_TABLET | Freq: Every day | ORAL | Status: DC
  Administered 2016-09-10 – 2016-09-21 (×10): 100 mg via ORAL
  Filled 2016-09-10 (×11): qty 1

## 2016-09-10 MED ORDER — ONDANSETRON HCL 4 MG PO TABS
4.0000 mg | ORAL_TABLET | Freq: Four times a day (QID) | ORAL | Status: DC | PRN
  Administered 2016-09-19: 4 mg via ORAL
  Filled 2016-09-10: qty 1

## 2016-09-10 MED ORDER — SEVELAMER CARBONATE 800 MG PO TABS
1600.0000 mg | ORAL_TABLET | Freq: Three times a day (TID) | ORAL | Status: DC
  Administered 2016-09-13 – 2016-09-15 (×4): 1600 mg via ORAL
  Filled 2016-09-10 (×5): qty 2

## 2016-09-10 MED ORDER — INSULIN ASPART 100 UNIT/ML ~~LOC~~ SOLN: 20.0000 [IU] | Freq: Two times a day (BID) | SUBCUTANEOUS | Status: DC

## 2016-09-10 MED ORDER — PHENYLEPHRINE HCL 10 MG/ML IJ SOLN
0.0000 ug/min | INTRAVENOUS | Status: DC
  Administered 2016-09-10: 150 ug/min via INTRAVENOUS
  Administered 2016-09-10: 200 ug/min via INTRAVENOUS
  Administered 2016-09-10: 180 ug/min via INTRAVENOUS
  Administered 2016-09-10: 150 ug/min via INTRAVENOUS
  Administered 2016-09-10: 100 ug/min via INTRAVENOUS
  Administered 2016-09-10: 190 ug/min via INTRAVENOUS
  Administered 2016-09-10: 210 ug/min via INTRAVENOUS
  Administered 2016-09-10: 200 ug/min via INTRAVENOUS
  Administered 2016-09-10: 170 ug/min via INTRAVENOUS
  Administered 2016-09-10: 100 ug/min via INTRAVENOUS
  Filled 2016-09-10 (×8): qty 1

## 2016-09-10 MED ORDER — DEXTROSE 5 % IV SOLN
1.0000 g | Freq: Once | INTRAVENOUS | Status: AC
  Administered 2016-09-10: 1 g via INTRAVENOUS
  Filled 2016-09-10 (×2): qty 10

## 2016-09-10 MED ORDER — NOREPINEPHRINE BITARTRATE 1 MG/ML IV SOLN
0.0000 ug/min | INTRAVENOUS | Status: DC
  Administered 2016-09-10: 10 ug/min via INTRAVENOUS
  Administered 2016-09-11: 22 ug/min via INTRAVENOUS
  Administered 2016-09-11: 25 ug/min via INTRAVENOUS
  Administered 2016-09-11: 23 ug/min via INTRAVENOUS
  Administered 2016-09-12: 17 ug/min via INTRAVENOUS
  Administered 2016-09-12: 13 ug/min via INTRAVENOUS
  Administered 2016-09-12: 16 ug/min via INTRAVENOUS
  Administered 2016-09-12: 9 ug/min via INTRAVENOUS
  Administered 2016-09-12: 10 ug/min via INTRAVENOUS
  Administered 2016-09-12: 15 ug/min via INTRAVENOUS
  Administered 2016-09-12: 20 ug/min via INTRAVENOUS
  Administered 2016-09-12: 19 ug/min via INTRAVENOUS
  Administered 2016-09-12: 14 ug/min via INTRAVENOUS
  Administered 2016-09-12: 18 ug/min via INTRAVENOUS
  Administered 2016-09-12: 21 ug/min via INTRAVENOUS
  Filled 2016-09-10 (×3): qty 16

## 2016-09-10 MED ORDER — HALOPERIDOL LACTATE 5 MG/ML IJ SOLN
5.0000 mg | Freq: Once | INTRAMUSCULAR | Status: AC
  Administered 2016-09-11: 5 mg via INTRAVENOUS
  Filled 2016-09-10: qty 1

## 2016-09-10 MED ORDER — ORAL CARE MOUTH RINSE
15.0000 mL | Freq: Two times a day (BID) | OROMUCOSAL | Status: DC
  Administered 2016-09-11 – 2016-09-15 (×7): 15 mL via OROMUCOSAL

## 2016-09-10 MED ORDER — PROPRANOLOL HCL 40 MG PO TABS
80.0000 mg | ORAL_TABLET | Freq: Two times a day (BID) | ORAL | Status: DC
  Filled 2016-09-10 (×2): qty 1

## 2016-09-10 MED ORDER — PROMETHAZINE HCL 25 MG PO TABS: 25.0000 mg | ORAL_TABLET | Freq: Four times a day (QID) | ORAL | Status: DC | PRN

## 2016-09-10 MED ORDER — TEMAZEPAM 7.5 MG PO CAPS
15.0000 mg | ORAL_CAPSULE | Freq: Every evening | ORAL | Status: DC | PRN
Start: 2016-09-10 — End: 2016-09-11

## 2016-09-10 MED ORDER — INSULIN ASPART 100 UNIT/ML ~~LOC~~ SOLN
0.0000 [IU] | Freq: Every day | SUBCUTANEOUS | Status: DC
  Administered 2016-09-15 – 2016-09-19 (×3): 2 [IU] via SUBCUTANEOUS
  Filled 2016-09-10 (×4): qty 2

## 2016-09-10 MED ORDER — PANTOPRAZOLE SODIUM 40 MG PO TBEC
40.0000 mg | DELAYED_RELEASE_TABLET | Freq: Every day | ORAL | Status: DC
  Administered 2016-09-10 – 2016-09-21 (×9): 40 mg via ORAL
  Filled 2016-09-10 (×10): qty 1

## 2016-09-10 MED ORDER — FLUTICASONE PROPIONATE 50 MCG/ACT NA SUSP
2.0000 | Freq: Every day | NASAL | Status: DC
  Administered 2016-09-10 – 2016-09-20 (×9): 2 via NASAL
  Filled 2016-09-10: qty 16

## 2016-09-10 MED ORDER — PHENYLEPHRINE HCL 10 MG/ML IJ SOLN
0.0000 ug/min | INTRAVENOUS | Status: DC
  Filled 2016-09-10: qty 4

## 2016-09-10 MED ORDER — SODIUM CHLORIDE 0.9% FLUSH
10.0000 mL | Freq: Two times a day (BID) | INTRAVENOUS | Status: DC
  Administered 2016-09-10 – 2016-09-14 (×8): 10 mL
  Administered 2016-09-15: 30 mL
  Administered 2016-09-18: 10 mL

## 2016-09-10 MED ORDER — SODIUM CHLORIDE 0.9 % IV BOLUS (SEPSIS)
250.0000 mL | Freq: Once | INTRAVENOUS | Status: AC
  Administered 2016-09-10: 250 mL via INTRAVENOUS

## 2016-09-10 MED ORDER — OXYCODONE-ACETAMINOPHEN 5-325 MG PO TABS
1.0000 | ORAL_TABLET | Freq: Four times a day (QID) | ORAL | Status: DC | PRN
  Administered 2016-09-10 (×2): 1 via ORAL
  Filled 2016-09-10 (×2): qty 1

## 2016-09-10 MED ORDER — HEPARIN BOLUS VIA INFUSION
2000.0000 [IU] | Freq: Once | INTRAVENOUS | Status: AC
  Administered 2016-09-10: 2000 [IU] via INTRAVENOUS
  Filled 2016-09-10: qty 2000

## 2016-09-10 MED ORDER — SEVELAMER CARBONATE 800 MG PO TABS: 800.0000 mg | ORAL_TABLET | Freq: Every day | ORAL | Status: DC

## 2016-09-10 MED ORDER — CALCIUM ACETATE (PHOS BINDER) 667 MG PO CAPS: 667.0000 mg | ORAL_CAPSULE | Freq: Two times a day (BID) | ORAL | Status: DC

## 2016-09-10 MED ORDER — ONDANSETRON HCL 4 MG/2ML IJ SOLN
4.0000 mg | Freq: Four times a day (QID) | INTRAMUSCULAR | Status: DC | PRN
  Administered 2016-09-13: 4 mg via INTRAVENOUS
  Filled 2016-09-10 (×2): qty 2

## 2016-09-10 MED ORDER — SODIUM CHLORIDE 0.9% FLUSH
3.0000 mL | Freq: Two times a day (BID) | INTRAVENOUS | Status: DC
  Administered 2016-09-10 – 2016-09-18 (×8): 3 mL via INTRAVENOUS

## 2016-09-10 MED ORDER — MIDODRINE HCL 5 MG PO TABS
5.0000 mg | ORAL_TABLET | Freq: Three times a day (TID) | ORAL | Status: DC
  Administered 2016-09-10 – 2016-09-17 (×15): 5 mg via ORAL
  Filled 2016-09-10 (×16): qty 1

## 2016-09-10 MED ORDER — VITAMIN D 1000 UNITS PO TABS
1000.0000 [IU] | ORAL_TABLET | Freq: Every day | ORAL | Status: DC
  Administered 2016-09-13 – 2016-09-21 (×9): 1000 [IU] via ORAL
  Filled 2016-09-10 (×9): qty 1

## 2016-09-10 MED ORDER — SODIUM CHLORIDE 0.9% FLUSH
3.0000 mL | Freq: Two times a day (BID) | INTRAVENOUS | Status: DC
  Administered 2016-09-10 – 2016-09-19 (×11): 3 mL via INTRAVENOUS

## 2016-09-10 MED ORDER — INSULIN ASPART 100 UNIT/ML ~~LOC~~ SOLN
0.0000 [IU] | Freq: Three times a day (TID) | SUBCUTANEOUS | Status: DC
  Administered 2016-09-10: 7 [IU] via SUBCUTANEOUS
  Administered 2016-09-10: 4 [IU] via SUBCUTANEOUS
  Administered 2016-09-10 – 2016-09-11 (×2): 3 [IU] via SUBCUTANEOUS
  Administered 2016-09-12: 4 [IU] via SUBCUTANEOUS
  Administered 2016-09-12: 7 [IU] via SUBCUTANEOUS
  Administered 2016-09-12: 3 [IU] via SUBCUTANEOUS
  Administered 2016-09-13 – 2016-09-14 (×4): 4 [IU] via SUBCUTANEOUS
  Administered 2016-09-14: 5 [IU] via SUBCUTANEOUS
  Administered 2016-09-15 – 2016-09-16 (×4): 4 [IU] via SUBCUTANEOUS
  Administered 2016-09-16 (×2): 3 [IU] via SUBCUTANEOUS
  Administered 2016-09-17: 4 [IU] via SUBCUTANEOUS
  Administered 2016-09-17: 7 [IU] via SUBCUTANEOUS
  Administered 2016-09-17: 3 [IU] via SUBCUTANEOUS
  Administered 2016-09-18 – 2016-09-19 (×3): 7 [IU] via SUBCUTANEOUS
  Administered 2016-09-19: 4 [IU] via SUBCUTANEOUS
  Administered 2016-09-19: 11 [IU] via SUBCUTANEOUS
  Administered 2016-09-20: 7 [IU] via SUBCUTANEOUS
  Administered 2016-09-21: 3 [IU] via SUBCUTANEOUS
  Filled 2016-09-10 (×3): qty 4
  Filled 2016-09-10: qty 7
  Filled 2016-09-10 (×2): qty 4
  Filled 2016-09-10: qty 11
  Filled 2016-09-10 (×2): qty 4
  Filled 2016-09-10: qty 7
  Filled 2016-09-10: qty 4
  Filled 2016-09-10: qty 7
  Filled 2016-09-10: qty 4
  Filled 2016-09-10: qty 3
  Filled 2016-09-10: qty 4
  Filled 2016-09-10: qty 7
  Filled 2016-09-10: qty 4
  Filled 2016-09-10: qty 7
  Filled 2016-09-10: qty 3
  Filled 2016-09-10: qty 4
  Filled 2016-09-10 (×2): qty 3
  Filled 2016-09-10: qty 4
  Filled 2016-09-10 (×2): qty 3
  Filled 2016-09-10: qty 7
  Filled 2016-09-10: qty 3
  Filled 2016-09-10: qty 7
  Filled 2016-09-10: qty 3

## 2016-09-10 MED ORDER — SENNA 8.6 MG PO TABS: 2.0000 | ORAL_TABLET | Freq: Every day | ORAL | Status: DC | PRN

## 2016-09-10 MED ORDER — PROMETHAZINE HCL 25 MG/ML IJ SOLN: 12.5000 mg | Freq: Four times a day (QID) | INTRAMUSCULAR | Status: DC | PRN

## 2016-09-10 MED ORDER — ADULT MULTIVITAMIN W/MINERALS CH: 1.0000 | ORAL_TABLET | Freq: Every day | ORAL | Status: DC

## 2016-09-10 MED ORDER — SODIUM CHLORIDE 0.9% FLUSH
3.0000 mL | INTRAVENOUS | Status: DC | PRN
  Administered 2016-09-14: 3 mL via INTRAVENOUS
  Filled 2016-09-10: qty 3

## 2016-09-10 MED ORDER — SODIUM CHLORIDE 0.9% FLUSH: 10.0000 mL | INTRAVENOUS | Status: DC | PRN

## 2016-09-10 MED ORDER — NYSTATIN 100000 UNIT/GM EX CREA
1.0000 "application " | TOPICAL_CREAM | Freq: Two times a day (BID) | CUTANEOUS | Status: DC | PRN
  Filled 2016-09-10: qty 15

## 2016-09-10 MED ORDER — IPRATROPIUM-ALBUTEROL 0.5-2.5 (3) MG/3ML IN SOLN
3.0000 mL | Freq: Four times a day (QID) | RESPIRATORY_TRACT | Status: DC | PRN
  Administered 2016-09-20: 3 mL via RESPIRATORY_TRACT
  Filled 2016-09-10: qty 3

## 2016-09-10 MED ORDER — SODIUM CHLORIDE 0.9% FLUSH
10.0000 mL | INTRAVENOUS | Status: DC | PRN
  Administered 2016-09-19: 10 mL
  Filled 2016-09-10: qty 40

## 2016-09-10 MED ORDER — PHENYLEPHRINE HCL 10 MG/ML IJ SOLN
30.0000 ug/min | INTRAMUSCULAR | Status: DC
  Administered 2016-09-10: 50 ug/min via INTRAVENOUS
  Filled 2016-09-10 (×2): qty 1

## 2016-09-10 MED ORDER — POLYETHYLENE GLYCOL 3350 17 G PO PACK
17.0000 g | PACK | Freq: Every day | ORAL | Status: DC
  Administered 2016-09-13 – 2016-09-21 (×5): 17 g via ORAL
  Filled 2016-09-10 (×6): qty 1

## 2016-09-10 MED ORDER — DEXTROSE 5 % IV SOLN
0.0000 ug/min | INTRAVENOUS | Status: DC
  Administered 2016-09-10: 150 ug/min via INTRAVENOUS
  Filled 2016-09-10: qty 4

## 2016-09-10 MED ORDER — IOPAMIDOL (ISOVUE-370) INJECTION 76%
75.0000 mL | Freq: Once | INTRAVENOUS | Status: AC | PRN
  Administered 2016-09-10: 75 mL via INTRAVENOUS

## 2016-09-10 MED ORDER — DEXTROSE 5 % IV SOLN
1.0000 g | INTRAVENOUS | Status: DC
  Administered 2016-09-11: 1 g via INTRAVENOUS
  Filled 2016-09-10 (×2): qty 10

## 2016-09-10 MED ORDER — ACETAMINOPHEN 650 MG RE SUPP: 650.0000 mg | Freq: Four times a day (QID) | RECTAL | Status: DC | PRN

## 2016-09-10 MED ORDER — SEVELAMER CARBONATE 800 MG PO TABS
800.0000 mg | ORAL_TABLET | Freq: Two times a day (BID) | ORAL | Status: DC
  Administered 2016-09-13 – 2016-09-15 (×2): 800 mg via ORAL
  Filled 2016-09-10 (×3): qty 1

## 2016-09-10 MED ORDER — HEPARIN SODIUM (PORCINE) 5000 UNIT/ML IJ SOLN
5000.0000 [IU] | Freq: Three times a day (TID) | INTRAMUSCULAR | Status: DC
  Administered 2016-09-10 (×2): 5000 [IU] via SUBCUTANEOUS
  Filled 2016-09-10 (×2): qty 1

## 2016-09-10 MED ORDER — SODIUM CHLORIDE 0.9% FLUSH: 10.0000 mL | Freq: Two times a day (BID) | INTRAVENOUS | Status: DC

## 2016-09-10 MED ORDER — IOPAMIDOL (ISOVUE-300) INJECTION 61%: 75.0000 mL | Freq: Once | INTRAVENOUS | Status: DC | PRN

## 2016-09-10 MED ORDER — CALCIUM CARBONATE-VITAMIN D 500-200 MG-UNIT PO TABS: 1.0000 | ORAL_TABLET | Freq: Two times a day (BID) | ORAL | Status: DC

## 2016-09-10 MED ORDER — ACETAMINOPHEN 325 MG PO TABS
650.0000 mg | ORAL_TABLET | Freq: Four times a day (QID) | ORAL | Status: DC | PRN
  Administered 2016-09-14 – 2016-09-20 (×7): 650 mg via ORAL
  Filled 2016-09-10 (×7): qty 2

## 2016-09-10 MED ORDER — HEPARIN (PORCINE) IN NACL 100-0.45 UNIT/ML-% IJ SOLN
1200.0000 [IU]/h | INTRAMUSCULAR | Status: DC
  Administered 2016-09-10 – 2016-09-11 (×2): 1200 [IU]/h via INTRAVENOUS
  Filled 2016-09-10 (×3): qty 250

## 2016-09-10 MED ORDER — DEXTROSE 5 % IV SOLN
500.0000 mg | INTRAVENOUS | Status: DC
  Administered 2016-09-11 – 2016-09-15 (×5): 500 mg via INTRAVENOUS
  Filled 2016-09-10 (×8): qty 500

## 2016-09-10 NOTE — Progress Notes (Signed)
ANTICOAGULATION CONSULT NOTE - Initial Consult  Pharmacy Consult for Heparin Indication: r/o  pulmonary embolus  Allergies  Allergen Reactions  . Angiotensin Receptor Blockers Other (See Comments)    Reaction:  Hyperkalemia  . Penicillins Rash and Other (See Comments)    rash    Patient Measurements: Height: 5\' 1"  (154.9 cm) Weight: 218 lb 4.1 oz (99 kg) IBW/kg (Calculated) : 47.8  Vital Signs: Temp: 97.4 F (36.3 C) (09/29 1345) Temp Source: Axillary (09/29 1345) BP: 92/65 (09/29 1555) Pulse Rate: 92 (09/29 1555)  Labs:  Recent Labs  09/09/16 1754 09/10/16 0029 09/10/16 0623 09/10/16 1145  HGB 11.8*  --  12.2  --   HCT 35.2  --  38.3  --   PLT 159  --  148*  --   APTT  --   --   --  44*  LABPROT  --   --   --  15.7*  INR  --   --   --  1.24  CREATININE 4.11*  --  4.31*  --   TROPONINI 0.03* 0.03* 0.07* 0.20*    Estimated Creatinine Clearance: 11.2 mL/min (by C-G formula based on SCr of 4.31 mg/dL (H)).   Medical History: Past Medical History:  Diagnosis Date  . A-fib (HCC)    not on anticoagulation  . Anemia   . Bilateral lower extremity edema   . CHF (congestive heart failure) ([place])   . Diabetes mellitus without complication [facility])    Patient takes Insulin  . Dysrhythmia   . ESRD (end stage renal disease) ([place])    Monday, wednesday, Friday DIalysis  . Essential tremor   . HTN (hypertension)   . OSA (obstructive sleep apnea)   . Osteoarthritis   . Renal insufficiency    Patient is on dialysis and normal days are M,W and F.  . Skin cancer    Resected from legs    Medications:  Prescriptions Prior to Admission  Medication Sig Dispense Refill Last Dose  . allopurinol (ZYLOPRIM) 100 MG tablet Take 100 mg by mouth daily.    09/09/2016 at 0800  . calcium acetate (PHOSLO) 667 MG capsule Take 1 capsule (667 mg total) by mouth 2 (two) times daily between meals. 60 capsule 0 unknown at unknown  . calcium-vitamin D (OSCAL WITH D) 500-200 MG-UNIT tablet  Take 1 tablet by mouth 2 (two) times daily with a meal.   unknown at unknown  . cholecalciferol (VITAMIN D) 1000 units tablet Take 1,000 Units by mouth daily.   unknown at unknown  . docusate sodium (COLACE) 100 MG capsule Take 1 capsule (100 mg total) by mouth 2 (two) times daily. 10 capsule 0 unknown at unknown  . fluticasone (FLONASE) 50 MCG/ACT nasal spray Place 2 sprays into both nostrils at bedtime.   09/08/2016 at 1800  . insulin aspart (NOVOLOG) 100 UNIT/ML injection Inject 20-25 Units into the skin 2 (two) times daily.   09/09/2016 at 1000  . ipratropium-albuterol (DUONEB) 0.5-2.5 (3) MG/3ML SOLN Take 3 mLs by nebulization 4 (four) times daily as needed (for wheezing/shortness of breath).   09/09/2016 at 1000  . midodrine (PROAMATINE) 5 MG tablet Take 1 tablet (5 mg total) by mouth 3 (three) times daily with meals.   09/09/2016 at 1000  . Multiple Vitamin (MULTIVITAMIN WITH MINERALS) TABS tablet Take 1 tablet by mouth daily.   unknown at unknown  . nystatin cream (MYCOSTATIN) Apply 1 application topically 2 (two) times daily as needed (for itching).  prn at prn  . oxyCODONE-acetaminophen (PERCOCET/ROXICET) 5-325 MG tablet Take 1 tablet by mouth every 6 (six) hours as needed for moderate pain. 30 tablet 0 prn at prn  . pantoprazole (PROTONIX) 40 MG tablet Take 40 mg by mouth daily.   09/09/2016 at 1000  . promethazine (PHENERGAN) 25 MG tablet Take 25 mg by mouth every 6 (six) hours as needed for nausea or vomiting.    09/09/2016 at 1000  . propranolol (INDERAL) 80 MG tablet Take 80 mg by mouth 2 (two) times daily.   09/09/2016 at 10000  . RENVELA 800 MG tablet Take 800-1,600 mg by mouth 5 (five) times daily. Pt takes two tablets three times daily with meals and one tablet two times daily with snacks.   unknown at unknown  . senna (SENOKOT) 8.6 MG TABS tablet Take 2 tablets (17.2 mg total) by mouth daily as needed for mild constipation. 30 each 0 prn at prn  . temazepam (RESTORIL) 15 MG capsule Take  15 mg by mouth at bedtime as needed for sleep.   09/08/2016 at 1800  . traMADol (ULTRAM) 50 MG tablet Take 50 mg by mouth 3 (three) times daily as needed for moderate pain.   09/08/2016 at 1800  . insulin lispro protamine-lispro (HUMALOG 75/25 MIX) (75-25) 100 UNIT/ML SUSP injection Inject 50 Units into the skin 2 (two) times daily.   unknown at unknown   . polyethylene glycol (MIRALAX / GLYCOLAX) packet Take 17 g by mouth daily.   unknown at unknown    Scheduled:  . allopurinol  100 mg Oral Daily  . calcium acetate  667 mg Oral BID BM  . calcium-vitamin D  1 tablet Oral BID WC  . cholecalciferol  1,000 Units Oral Daily  . docusate sodium  100 mg Oral BID  . fluticasone  2 spray Each Nare QHS  . insulin aspart  0-20 Units Subcutaneous TID WC  . insulin aspart  0-5 Units Subcutaneous QHS  . mouth rinse  15 mL Mouth Rinse q12n4p  . midodrine  5 mg Oral TID WC  . multivitamin with minerals  1 tablet Oral Daily  . pantoprazole  40 mg Oral QAC breakfast  . polyethylene glycol  17 g Oral Daily  . propranolol  80 mg Oral BID  . sevelamer carbonate  1,600 mg Oral TID WC  . sevelamer carbonate  800 mg Oral BID BM  . sodium chloride flush  10-40 mL Intracatheter Q12H  . sodium chloride flush  3 mL Intravenous Q12H  . sodium chloride flush  3 mL Intravenous Q12H    Assessment: 82 y/o F with a h/o ESRD, CHF, and DM admitted with hypotension to begin heparin infusion for suspected PE.   Goal of Therapy:  Heparin level 0.3-0.7 units/ml Monitor platelets by anticoagulation protocol: Yes   Plan:  Give 2000 units bolus x 1-- half-bolus with recent [place] heparin Start heparin infusion at 1200 units/hr Check anti-Xa level in 8 hours and daily while on heparin Continue to monitor H&H and platelets  Ulice Dash D 09/10/2016,4:06 PM

## 2016-09-10 NOTE — Progress Notes (Signed)
Patient ID: [name], female   DOB: [birth year], 82 y.o.   MRN: [id number] Called by nursing on the floor secondary to patient with hypotension. Given end-stage renal disease on hemodialysis will transfer to the ICU for pressor support. Patient has already received one liter of NS. Case discussed with a link provider who accepted the case and will start Neo-Synephrine.

## 2016-09-10 NOTE — Progress Notes (Signed)
Hd completed 

## 2016-09-10 NOTE — Progress Notes (Signed)
PT Cancellation Note  Patient Details Name: [name] MRN: [id number] DOB: 05/30/1934   Cancelled Treatment:    Reason Eval/Treat Not Completed: Medical issues which prohibited therapy;Patient at procedure or test/unavailable Pt getting dialysis this AM, unavailable.  Pt also has had very low BP and elevated heart rate.  Spoke with nursing who reports pt would not be a candidate for getting out of bed at this point.  Will hold PT and attempt at a later date.  Kreg Shropshire 09/10/2016, 10:05 AM

## 2016-09-10 NOTE — Progress Notes (Signed)
Pt received from ED via stretcher on 3.5 liters 02. VS on arrival to unit  97.7, resp 24, HR 133, BP 62/40, 02 99%.. Rapid response and MD called. Received order to transfer pt to CCU

## 2016-09-10 NOTE — Progress Notes (Addendum)
Since returning from CT patient's work of breathing has increased and o2 sats have continued to drop requiring increase in fiO2 on bipap.  Patient now at 80% fio2.  RN made Marda Stalker, NP aware of increased work of breathing with RR in 30's and patient requiring 80% fio2 on bipap and o2 sats only 85-86%. Patient drowsy but arouses to voice.  Amy, NT at bedside assessing patient.  NP going in room to assess patient. Continuing to monitor.

## 2016-09-10 NOTE — Procedures (Signed)
Central Venous Catheter Insertion Procedure Note [name] [id number] 30-Jun-1934  Procedure: Insertion of Central Venous Catheter Indications: Assessment of intravascular volume, Drug and/or fluid administration and Frequent blood sampling  Procedure Details Consent: Risks of procedure as well as the alternatives and risks of each were explained to the (patient/caregiver).  Consent for procedure obtained. Time Out: Verified patient identification, verified procedure, site/side was marked, verified correct patient position, special equipment/implants available, medications/allergies/relevent history reviewed, required imaging and test results available.  Performed  Maximum sterile technique was used including antiseptics, cap, gloves, gown, hand hygiene, mask and sheet. Skin prep: Chlorhexidine; local anesthetic administered A antimicrobial bonded/coated triple lumen catheter was placed in the right internal jugular vein using the Seldinger technique.  Evaluation Blood flow good Complications: No apparent complications Patient did tolerate procedure well. Chest X-ray ordered to verify placement.  CXR: pending.  Central placed utilizing ultrasound no complications during procedure   Awilda Bill 09/10/2016, 11:43 AM

## 2016-09-10 NOTE — Progress Notes (Signed)
[facility] Progress Note Patient Name: [name] DOB: [birth year] MRN: [id number]   Date of Service  09/10/2016  HPI/Events of Note  87 F with MMP including CHF/DM/ESRD presenting to ED with right periprosthetic femur fx, hypoxia.  Patient initially admitted to floor but trasnferred to ICU for hypotension.  Of Note patient on midodrine for BP support chronically at home.  Current BP of 71/51 (59)  BP normally runs in the 90s.  O2 sats of 94%.  On camera check patient is alert and responsive to staff.  In no immediate distress.  eICU Interventions  Plan: BiPAP for resp support Start NEO gtt via PIV for BP support given tachycardia F/U additional labs given bump in trop To be seen by PCCM in AM or if clinical condition worsens. Ortho and Nephrology consults have been ordered by primary attending     Intervention Category Evaluation Type: New Patient Evaluation  Kaoru Benda 09/10/2016, 1:52 AM

## 2016-09-10 NOTE — Care Management (Signed)
Admitted to [facility] with the diagnosis of hyponatremia/hyperkalemia. Lives with husband, Garnet Koyanagi, [phone]). Dr. Emily Filbert is listed as primary care physician. Chronic home oxygen through [place] since April 2017. Home Health through [place] in the past. Established dialysis per [facility] on [address].  [place] lift and hospital bed in the home.  Rapid Response called and transferred to ICU. Placed on Bi-PAP. Shelbie Ammons RN MSN CCM Care Management [phone]
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
## 2016-09-10 NOTE — Progress Notes (Signed)
Marda Stalker, NP gave order for levophed drip that is to be started after central line placed.

## 2016-09-10 NOTE — Consult Note (Signed)
ORTHOPAEDIC CONSULTATION  REQUESTING PHYSICIAN: Henreitta Leber, MD  Chief Complaint: right knee pain  HPI: [name] is a 82 y.o. female who complains of  roight knee pain following a fall getting out of wheelchair yesterday.  Felt pop in knees.  Brought to ER where exam and x-rays show an impacted right distal femur fx just above her TKR.  She was admitted for severe pulmonary problems and is currently in ICU.  Also has dialysis 3x/week.    Past Medical History:  Diagnosis Date  . A-fib (HCC)    not on anticoagulation  . Anemia   . Bilateral lower extremity edema   . CHF (congestive heart failure) ([place])   . Diabetes mellitus without complication [facility])    Patient takes Insulin  . Dysrhythmia   . ESRD (end stage renal disease) ([place])    Monday, wednesday, Friday DIalysis  . Essential tremor   . HTN (hypertension)   . OSA (obstructive sleep apnea)   . Osteoarthritis   . Renal insufficiency    Patient is on dialysis and normal days are M,W and F.  . Skin cancer    Resected from legs   Past Surgical History:  Procedure Laterality Date  . ABDOMINAL HYSTERECTOMY    . ANKLE FRACTURE SURGERY     right ankle  . CHOLECYSTECTOMY    . ESOPHAGOGASTRODUODENOSCOPY (EGD) WITH PROPOFOL N/A 10/09/2015   Procedure: ESOPHAGOGASTRODUODENOSCOPY (EGD) WITH PROPOFOL;  Surgeon: Hulen Luster, MD;  Location: [facility] ENDOSCOPY;  Service: Gastroenterology;  Laterality: N/A;  . FEMUR FRACTURE SURGERY     left femur  . KNEE ARTHROPLASTY    . NEUROPLASTY / TRANSPOSITION MEDIAN NERVE AT CARPAL TUNNEL BILATERAL    . PERIPHERAL VASCULAR CATHETERIZATION Left 08/06/2015   Procedure: A/V Shuntogram/Fistulagram;  Surgeon: Algernon Huxley, MD;  Location: [place] CV LAB;  Service: Cardiovascular;  Laterality: Left;  . PERIPHERAL VASCULAR CATHETERIZATION Left 11/20/2015   Procedure: A/V Shuntogram/Fistulagram;  Surgeon: Algernon Huxley, MD;  Location: [place] CV LAB;  Service: Cardiovascular;  Laterality:  Left;  . PERIPHERAL VASCULAR CATHETERIZATION N/A 11/20/2015   Procedure: A/V Shunt Intervention;  Surgeon: Algernon Huxley, MD;  Location: [place] CV LAB;  Service: Cardiovascular;  Laterality: N/A;  . TUBAL LIGATION     Social History   Social History  . Marital status: Married    Spouse name: N/A  . Number of children: N/A  . Years of education: N/A   Social History Main Topics  . Smoking status: Never Smoker  . Smokeless tobacco: Never Used  . Alcohol use No  . Drug use: No  . Sexual activity: Not Asked   Other Topics Concern  . None   Social History Narrative  . None   Family History  Problem Relation Age of Onset  . Diabetes type II Father   . Breast cancer Sister 26  . Breast cancer Maternal Grandmother    Allergies  Allergen Reactions  . Angiotensin Receptor Blockers Other (See Comments)    Reaction:  Hyperkalemia  . Penicillins Rash and Other (See Comments)    rash   Prior to Admission medications   Medication Sig Start Date End Date Taking? Authorizing Provider  allopurinol (ZYLOPRIM) 100 MG tablet Take 100 mg by mouth daily.    Yes Historical Provider, MD  calcium acetate (PHOSLO) 667 MG capsule Take 1 capsule (667 mg total) by mouth 2 (two) times daily between meals. 04/10/16  Yes Vaughan Basta, MD  calcium-vitamin  D (OSCAL WITH D) 500-200 MG-UNIT tablet Take 1 tablet by mouth 2 (two) times daily with a meal.   Yes Historical Provider, MD  cholecalciferol (VITAMIN D) 1000 units tablet Take 1,000 Units by mouth daily.   Yes Historical Provider, MD  docusate sodium (COLACE) 100 MG capsule Take 1 capsule (100 mg total) by mouth 2 (two) times daily. 04/10/16  Yes Vaughan Basta, MD  fluticasone (FLONASE) 50 MCG/ACT nasal spray Place 2 sprays into both nostrils at bedtime.   Yes Historical Provider, MD  insulin aspart (NOVOLOG) 100 UNIT/ML injection Inject 20-25 Units into the skin 2 (two) times daily.   Yes Historical Provider, MD   ipratropium-albuterol (DUONEB) 0.5-2.5 (3) MG/3ML SOLN Take 3 mLs by nebulization 4 (four) times daily as needed (for wheezing/shortness of breath).   Yes Historical Provider, MD  midodrine (PROAMATINE) 5 MG tablet Take 1 tablet (5 mg total) by mouth 3 (three) times daily with meals. 08/20/15  Yes Rusty Aus, MD  Multiple Vitamin (MULTIVITAMIN WITH MINERALS) TABS tablet Take 1 tablet by mouth daily.   Yes Historical Provider, MD  nystatin cream (MYCOSTATIN) Apply 1 application topically 2 (two) times daily as needed (for itching).    Yes Historical Provider, MD  oxyCODONE-acetaminophen (PERCOCET/ROXICET) 5-325 MG tablet Take 1 tablet by mouth every 6 (six) hours as needed for moderate pain. 04/10/16  Yes Vaughan Basta, MD  pantoprazole (PROTONIX) 40 MG tablet Take 40 mg by mouth daily.   Yes Historical Provider, MD  promethazine (PHENERGAN) 25 MG tablet Take 25 mg by mouth every 6 (six) hours as needed for nausea or vomiting.    Yes Historical Provider, MD  propranolol (INDERAL) 80 MG tablet Take 80 mg by mouth 2 (two) times daily.   Yes Historical Provider, MD  RENVELA 800 MG tablet Take 800-1,600 mg by mouth 5 (five) times daily. Pt takes two tablets three times daily with meals and one tablet two times daily with snacks. 06/30/15  Yes Historical Provider, MD  senna (SENOKOT) 8.6 MG TABS tablet Take 2 tablets (17.2 mg total) by mouth daily as needed for mild constipation. 04/10/16  Yes Vaughan Basta, MD  temazepam (RESTORIL) 15 MG capsule Take 15 mg by mouth at bedtime as needed for sleep.   Yes Historical Provider, MD  traMADol (ULTRAM) 50 MG tablet Take 50 mg by mouth 3 (three) times daily as needed for moderate pain.   Yes Historical Provider, MD  insulin lispro protamine-lispro (HUMALOG 75/25 MIX) (75-25) 100 UNIT/ML SUSP injection Inject 50 Units into the skin 2 (two) times daily.    Historical Provider, MD  polyethylene glycol (MIRALAX / GLYCOLAX) packet Take 17 g by mouth daily.     Historical Provider, MD   Dg Chest 2 View  Result Date: 09/09/2016 CLINICAL DATA:  [place] tonight. EXAM: CHEST  2 VIEW COMPARISON:  04/07/2016 FINDINGS: The heart is enlarged but stable. Mediastinal and hilar contours are slightly prominent but unchanged. The lungs are clear of acute process. Stable right basilar scarring changes mild chronic right-sided pleural thickening. Remote healed rib fractures are noted on the left side. No definite acute fractures. IMPRESSION: Stable cardiac enlargement and chronic lung changes but no acute pulmonary findings. Remote healed left-sided rib fractures. No definite acute fractures. Electronically Signed   By: Marijo Sanes M.D.   On: 09/09/2016 19:00   Dg Tibia/fibula Left  Result Date: 09/09/2016 CLINICAL DATA:  [place] tonight at home.  Left leg pain. EXAM: LEFT TIBIA AND FIBULA - 2 VIEW  COMPARISON:  None FINDINGS: Healed/ healing proximal fibular shaft fracture with callus formation. Small fracture line persists. No acute fracture of the tibia or fibular shaft. Ankle fractures are noted. See ankle films. IMPRESSION: Ankle fractures but no acute tibial or fibular shaft fractures. Healed/healing proximal fibular shaft fracture. Electronically Signed   By: Marijo Sanes M.D.   On: 09/09/2016 19:10   Dg Ankle Complete Left  Result Date: 09/09/2016 CLINICAL DATA:  [place] tonight at home.  Left ankle pain. EXAM: LEFT ANKLE COMPLETE - 3+ VIEW COMPARISON:  04/06/2016 FINDINGS: There are tri malleolar fractures of the ankle but these were also present on the prior film from April. No obvious acute fractures. IMPRESSION: Healing/ healed tri malleolar fractures of the ankle. No obvious acute fractures. Electronically Signed   By: Marijo Sanes M.D.   On: 09/09/2016 19:15   Dg Chest Port 1 View  Result Date: 09/10/2016 CLINICAL DATA:  Encounter for line placement. Atrial fibrillation, CHF, diabetes and hypertension. EXAM: PORTABLE CHEST 1 VIEW COMPARISON:  Chest x-ray from  earlier same day. FINDINGS: Compared to plain film from earlier same day, the right IJ central line has been advanced with tip now at the level of the upper SVC. Cardiomegaly appears stable. Overall cardiomediastinal silhouette appears stable in size and configuration. Mild atelectasis again noted at the right lung base. Lungs otherwise clear. No pleural effusion or pneumothorax seen. Osseous structures about the chest are unremarkable. IMPRESSION: 1. Right IJ central line slightly advanced in the interval with tip now at the level of the upper SVC. Would consider additional line advancement of approximately 5 cm for more optimal radiographic positioning at the expected location of the cavoatrial junction. 2. Stable cardiomegaly. 3. Stable mild atelectasis at the right lung base. Lungs otherwise clear. No pneumothorax. Electronically Signed   By: Franki Cabot M.D.   On: 09/10/2016 14:40   Dg Chest Port 1 View  Result Date: 09/10/2016 CLINICAL DATA:  Central line placement. EXAM: PORTABLE CHEST 1 VIEW COMPARISON:  09/10/2016 chest x-ray FINDINGS: The right IJ central venous catheter tip is in the proximal SVC. The cardiac silhouette, mediastinal and hilar contours are stable. The lungs are stable. No pneumothorax. IMPRESSION: Right IJ central venous catheter tip is in the proximal SVC. Electronically Signed   By: Marijo Sanes M.D.   On: 09/10/2016 12:22   Dg Chest Port 1 View  Result Date: 09/10/2016 CLINICAL DATA:  Acute respiratory failure EXAM: PORTABLE CHEST 1 VIEW COMPARISON:  09/09/2016 FINDINGS: Cardiac enlargement. Negative for heart failure. Negative for edema. Small right pleural effusion or pleural scarring is similar to prior studies. Mild elevation right hemidiaphragm with mild right lower lobe atelectasis similar to prior studies. Little change from yesterday IMPRESSION: No change from yesterday. Right lower lobe atelectasis/ scarring and small right pleural effusion unchanged. Electronically  Signed   By: Franchot Gallo M.D.   On: 09/10/2016 09:05   Dg Knee Complete 4 Views Left  Result Date: 09/09/2016 CLINICAL DATA:  [place] tonight at home.  Left knee pain. EXAM: LEFT KNEE - COMPLETE 4+ VIEW COMPARISON:  Radiographs 03/06/2015 FINDINGS: The femoral and tibial components or intact. No acute fractures identified. There is a long lateral compression sideplate and multiple screws transfixing an old distal femur fracture. No complicating features. There is also a remote healed proximal fibular shaft fracture. Extensive vascular calcifications are noted. No definite joint effusion. IMPRESSION: Intact hardware.  No acute fracture.  Remote healed fractures. Electronically Signed   By: Mamie Nick.  Gallerani M.D.   On: 09/09/2016 19:07   Dg Knee Complete 4 Views Right  Result Date: 09/09/2016 CLINICAL DATA:  [place] tonight at home. EXAM: RIGHT KNEE - COMPLETE 4+ VIEW COMPARISON:  None. FINDINGS: There is a periprosthetic fracture involving the lateral femoral condyle with mild displacement. Could not exclude a subtle fracture of the medial femoral condyle also. There is an associated joint effusion. IMPRESSION: Periprosthetic femoral fracture. Electronically Signed   By: Marijo Sanes M.D.   On: 09/09/2016 19:06    Positive ROS: All other systems have been reviewed and were otherwise negative with the exception of those mentioned in the HPI and as above.  Physical Exam: General: Alert, no acute distress Cardiovascular: No pedal edema Respiratory: No cyanosis, no use of accessory musculature GI: No organomegaly, abdomen is soft and non-tender Skin: No lesions in the area of chief complaint Neurologic: Sensation intact distally Psychiatric: Patient is competent for consent with normal mood and affect Lymphatic: No axillary or cervical lymphadenopathy  MUSCULOSKELETAL: Awake and breathing laboriously.  Knee immobilizer in place.  CSM good distally  Assessment: Right distal femur fx  Plan: Plan non  operative treatment for now.  Knee immobilizer in place.  Has boot on right leg for prior ankle fx.      Park Breed, MD [phone]   09/10/2016 3:12 PM

## 2016-09-10 NOTE — Progress Notes (Signed)
POAT DIALYSIS ASSESSMENT

## 2016-09-10 NOTE — Progress Notes (Signed)
Responded to rapid response. Patient with HR 130s. RR30 and 74/48 per monitor.  EKG performed per rapid response protocol. Patient on o2 3.5 liters. BBS good aeration throughout. No wheezing noted.  Saturation on nasal cannula noted 100% per monitor. Patient to be transferred to icu.

## 2016-09-10 NOTE — Progress Notes (Signed)
Staci, RN made Dr. Holley Raring aware of critical potassium of 2.6, MD gave order to repeat K+ level and if < 3 to order 20meq of potassium IV once.

## 2016-09-10 NOTE — Progress Notes (Signed)
Pre dialysis assessment 

## 2016-09-10 NOTE — Progress Notes (Signed)
  Echocardiogram 2D Echocardiogram has been performed.  Jennette Dubin 09/10/2016, 3:47 PM

## 2016-09-10 NOTE — Progress Notes (Signed)
Patient placed back on bipap due to becoming drowsy and increased work of breathing using abdominal muscles and increased respiratory rate.  Marda Stalker, NP aware and asked Amy, RT to place patient back on bipap. Continuing to monitor.
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
## 2016-09-10 NOTE — Progress Notes (Signed)
Pharmacy Antibiotic Note  [name] is a 82 y.o. female admitted on 09/09/2016 with pneumonia.  Pharmacy has been consulted for ceftriaxone dosing.  Plan: Ceftriaxone 1g IV q 24 hours  Height: 5\' 1"  (154.9 cm) Weight: 218 lb 4.1 oz (99 kg) IBW/kg (Calculated) : 47.8  Temp (24hrs), Avg:97.3 F (36.3 C), Min:96 F (35.6 C), Max:98.2 F (36.8 C)   Recent Labs Lab 09/09/16 1754 09/10/16 0623 09/10/16 0839 09/10/16 1145  WBC 15.2* 16.2*  --   --   CREATININE 4.11* 4.31*  --   --   LATICACIDVEN  --   --  5.3* 4.1*    Estimated Creatinine Clearance: 11.2 mL/min (by C-G formula based on SCr of 4.31 mg/dL (H)).    Allergies  Allergen Reactions  . Angiotensin Receptor Blockers Other (See Comments)    Reaction:  Hyperkalemia  . Penicillins Rash and Other (See Comments)    rash    Antimicrobials this admission: Azithromycin 9/29 >>  Ceftriaxone 9/29 >>    Microbiology results: 9/29 BCx: Sent 9/29 Sputum: Sent  9/29 MRSA PCR: Positive  Thank you for allowing pharmacy to be a part of this patient's care.  Darrow Bussing, PharmD Pharmacy Resident 09/10/2016 7:30 PM

## 2016-09-10 NOTE — H&P (Signed)
[place] @ [facility] Admission History and Physical Harvie Bridge, D.O.  ---------------------------------------------------------------------------------------------------------------------   PATIENT NAME: [name] MR#: [id number] DATE OF BIRTH: 10/15/1934 DATE OF ADMISSION: 09/09/2016 PRIMARY CARE PHYSICIAN: Rusty Aus, MD  REQUESTING/REFERRING PHYSICIAN: ED Dr. Kerman Passey  CHIEF COMPLAINT: Chief Complaint  Patient presents with  . Fatigue    HISTORY OF PRESENT ILLNESS: Purpose Barca is a 82 y.o. female with a known history of HTN, atrial fibrillation, CHF, diabetes, end-stage renal disease on hemodialysis Monday, Wednesday, Friday was in a usual state of health until this evening when she fell at home.  Patient states that she was standing from her wheelchair doing physical therapy when she became weak, slid to the ground to a seated position. She states that she felt her knees buckle, felt a pop in her knees. Patient is currently wearing a mobility boot to the left lower extremity from her fracture several months ago area and aside from minimal daily physical therapy she has been wheelchair bound in her home.  Otherwise there has been no change in status. Patient has been taking medication as prescribed and there has been no recent change in medication or diet.  There has been no recent illness, travel or sick contacts.    Patient denies fevers/chills, dizziness, chest pain, shortness of breath, N/V/C/D, abdominal pain, dysuria/frequency, changes in mental status.   EMS/ED COURSE:   Patient was found to be hypoxic in the emergency department satting 86% on room air which improved with 2 L nasal cannula. She was also found to be hypotensive in the [id number] systolic which is not far from her baseline of 123456 systolic. She did receive a 500 cc bolus of normal saline in the emergency department and 500 cc bolus from EMS.  PAST MEDICAL HISTORY: Past Medical History:   Diagnosis Date  . A-fib (HCC)    not on anticoagulation  . Anemia   . Bilateral lower extremity edema   . CHF (congestive heart failure) ([place])   . Diabetes mellitus without complication [facility])    Patient takes Insulin  . Dysrhythmia   . ESRD (end stage renal disease) ([place])    Monday, wednesday, Friday DIalysis  . Essential tremor   . HTN (hypertension)   . OSA (obstructive sleep apnea)   . Osteoarthritis   . Renal insufficiency    Patient is on dialysis and normal days are M,W and F.  . Skin cancer    Resected from legs      PAST SURGICAL HISTORY: Past Surgical History:  Procedure Laterality Date  . ABDOMINAL HYSTERECTOMY    . ANKLE FRACTURE SURGERY     right ankle  . CHOLECYSTECTOMY    . ESOPHAGOGASTRODUODENOSCOPY (EGD) WITH PROPOFOL N/A 10/09/2015   Procedure: ESOPHAGOGASTRODUODENOSCOPY (EGD) WITH PROPOFOL;  Surgeon: Hulen Luster, MD;  Location: [facility] ENDOSCOPY;  Service: Gastroenterology;  Laterality: N/A;  . FEMUR FRACTURE SURGERY     left femur  . KNEE ARTHROPLASTY    . NEUROPLASTY / TRANSPOSITION MEDIAN NERVE AT CARPAL TUNNEL BILATERAL    . PERIPHERAL VASCULAR CATHETERIZATION Left 08/06/2015   Procedure: A/V Shuntogram/Fistulagram;  Surgeon: Algernon Huxley, MD;  Location: [place] CV LAB;  Service: Cardiovascular;  Laterality: Left;  . PERIPHERAL VASCULAR CATHETERIZATION Left 11/20/2015   Procedure: A/V Shuntogram/Fistulagram;  Surgeon: Algernon Huxley, MD;  Location: [place] CV LAB;  Service: Cardiovascular;  Laterality: Left;  . PERIPHERAL VASCULAR CATHETERIZATION N/A 11/20/2015   Procedure: A/V Shunt Intervention;  Surgeon: Erskine Squibb  Lucky Cowboy, MD;  Location: [place] CV LAB;  Service: Cardiovascular;  Laterality: N/A;  . TUBAL LIGATION        SOCIAL HISTORY: Social History  Substance Use Topics  . Smoking status: Never Smoker  . Smokeless tobacco: Never Used  . Alcohol use No      FAMILY HISTORY: Family History  Problem Relation Age of Onset  . Diabetes  type II Father   . Breast cancer Sister 39  . Breast cancer Maternal Grandmother      MEDICATIONS AT HOME: Prior to Admission medications   Medication Sig Start Date End Date Taking? Authorizing Provider  allopurinol (ZYLOPRIM) 100 MG tablet Take 100 mg by mouth daily.    Yes Historical Provider, MD  calcium acetate (PHOSLO) 667 MG capsule Take 1 capsule (667 mg total) by mouth 2 (two) times daily between meals. 04/10/16  Yes Vaughan Basta, MD  calcium-vitamin D (OSCAL WITH D) 500-200 MG-UNIT tablet Take 1 tablet by mouth 2 (two) times daily with a meal.   Yes Historical Provider, MD  cholecalciferol (VITAMIN D) 1000 units tablet Take 1,000 Units by mouth daily.   Yes Historical Provider, MD  docusate sodium (COLACE) 100 MG capsule Take 1 capsule (100 mg total) by mouth 2 (two) times daily. 04/10/16  Yes Vaughan Basta, MD  fluticasone (FLONASE) 50 MCG/ACT nasal spray Place 2 sprays into both nostrils at bedtime.   Yes Historical Provider, MD  insulin aspart (NOVOLOG) 100 UNIT/ML injection Inject 20-25 Units into the skin 2 (two) times daily.   Yes Historical Provider, MD  ipratropium-albuterol (DUONEB) 0.5-2.5 (3) MG/3ML SOLN Take 3 mLs by nebulization 4 (four) times daily as needed (for wheezing/shortness of breath).   Yes Historical Provider, MD  midodrine (PROAMATINE) 5 MG tablet Take 1 tablet (5 mg total) by mouth 3 (three) times daily with meals. 08/20/15  Yes Rusty Aus, MD  Multiple Vitamin (MULTIVITAMIN WITH MINERALS) TABS tablet Take 1 tablet by mouth daily.   Yes Historical Provider, MD  nystatin cream (MYCOSTATIN) Apply 1 application topically 2 (two) times daily as needed (for itching).    Yes Historical Provider, MD  oxyCODONE-acetaminophen (PERCOCET/ROXICET) 5-325 MG tablet Take 1 tablet by mouth every 6 (six) hours as needed for moderate pain. 04/10/16  Yes Vaughan Basta, MD  pantoprazole (PROTONIX) 40 MG tablet Take 40 mg by mouth daily.   Yes Historical  Provider, MD  promethazine (PHENERGAN) 25 MG tablet Take 25 mg by mouth every 6 (six) hours as needed for nausea or vomiting.    Yes Historical Provider, MD  propranolol (INDERAL) 80 MG tablet Take 80 mg by mouth 2 (two) times daily.   Yes Historical Provider, MD  RENVELA 800 MG tablet Take 800-1,600 mg by mouth 5 (five) times daily. Pt takes two tablets three times daily with meals and one tablet two times daily with snacks. 06/30/15  Yes Historical Provider, MD  senna (SENOKOT) 8.6 MG TABS tablet Take 2 tablets (17.2 mg total) by mouth daily as needed for mild constipation. 04/10/16  Yes Vaughan Basta, MD  temazepam (RESTORIL) 15 MG capsule Take 15 mg by mouth at bedtime as needed for sleep.   Yes Historical Provider, MD  traMADol (ULTRAM) 50 MG tablet Take 50 mg by mouth 3 (three) times daily as needed for moderate pain.   Yes Historical Provider, MD  insulin lispro protamine-lispro (HUMALOG 75/25 MIX) (75-25) 100 UNIT/ML SUSP injection Inject 50 Units into the skin 2 (two) times daily.    Historical  Provider, MD  polyethylene glycol (MIRALAX / GLYCOLAX) packet Take 17 g by mouth daily.    Historical Provider, MD      DRUG ALLERGIES: Allergies  Allergen Reactions  . Angiotensin Receptor Blockers Other (See Comments)    Reaction:  Hyperkalemia  . Penicillins Rash and Other (See Comments)    rash     REVIEW OF SYSTEMS: CONSTITUTIONAL: No fever/chills, fatigue, Positive weakness, negative weight gain/loss, headache EYES: No blurry or double vision. ENT: No tinnitus, postnasal drip, redness or soreness of the oropharynx. RESPIRATORY: No cough, wheeze, hemoptysis, dyspnea. CARDIOVASCULAR: No chest pain, orthopnea, palpitations, syncope. GASTROINTESTINAL: No nausea, vomiting, constipation, diarrhea, abdominal pain, hematemesis, melena or hematochezia. GENITOURINARY: No dysuria or hematuria. ENDOCRINE: No polyuria or nocturia. No heat or cold intolerance. HEMATOLOGY: No anemia,  bruising, bleeding. INTEGUMENTARY: No rashes, ulcers, lesions. MUSCULOSKELETAL: No arthritis, swelling, gout. Bilateral lower extremity pain NEUROLOGIC: No numbness, tingling, weakness or ataxia. No seizure-type activity. PSYCHIATRIC: No anxiety, depression, insomnia.  PHYSICAL EXAMINATION: VITAL SIGNS: Blood pressure (!) 156/101, pulse (!) 133, temperature 97.4 F (36.3 C), temperature source Oral, resp. rate (!) 23, height 5\' 1"  (1.549 m), weight 95.3 kg (210 lb), SpO2 99 %.  GENERAL: 82 y.o.-year-old white female patient, well-developed, well-nourished lying in the bed in no acute distress.  Pleasant and cooperative.   HEENT: Head atraumatic, normocephalic. Pupils equal, round, reactive to light and accommodation. No scleral icterus. Extraocular muscles intact. Nares are patent. Oropharynx is clear. Mucus membranes moist. NECK: Supple, full range of motion. No JVD, no bruit heard. No thyroid enlargement, no tenderness, no cervical lymphadenopathy. CHEST: Normal breath sounds bilaterally. No wheezing, rales, rhonchi or crackles. No use of accessory muscles of respiration.  No reproducible chest wall tenderness.  CARDIOVASCULAR: S1, S2 normal. . Cap refill <2 seconds. ABDOMEN: Soft, nontender, nondistended. No rebound, guarding, rigidity. Normoactive bowel sounds present in all four quadrants. No organomegaly or mass. EXTREMITIES: Tenderness palpation over the right hip and thigh. Tenderness over the left knee and left ankle. Painful range of motion of bilateral knees. There is mild nonpitting bilateral lower extremity edema. No cyanosis, or clubbing. NEUROLOGIC: Cranial nerves II through XII are grossly intact with no focal sensorimotor deficit. Muscle strength 5/5 in all extremities. Sensation intact. Gait not checked. PSYCHIATRIC: The patient is alert and oriented x 3. Normal affect, mood, thought content. SKIN: Warm, dry, and intact without obvious rash, lesion, or ulcer.  LABORATORY  PANEL:  CBC  Recent Labs Lab 09/09/16 1754  WBC 15.2*  HGB 11.8*  HCT 35.2  PLT 159   ----------------------------------------------------------------------------------------------------------------- Chemistries  Recent Labs Lab 09/09/16 1754  NA 125*  K 5.6*  CL 95*  CO2 23  GLUCOSE 153*  BUN 41*  CREATININE 4.11*  CALCIUM 8.8*  AST 40  ALT 13*  ALKPHOS 260*  BILITOT 1.9*   ------------------------------------------------------------------------------------------------------------------ Cardiac Enzymes  Recent Labs Lab 09/09/16 1754  TROPONINI 0.03*   ------------------------------------------------------------------------------------------------------------------  RADIOLOGY: Dg Chest 2 View  Result Date: 09/09/2016 CLINICAL DATA:  [place] tonight. EXAM: CHEST  2 VIEW COMPARISON:  04/07/2016 FINDINGS: The heart is enlarged but stable. Mediastinal and hilar contours are slightly prominent but unchanged. The lungs are clear of acute process. Stable right basilar scarring changes mild chronic right-sided pleural thickening. Remote healed rib fractures are noted on the left side. No definite acute fractures. IMPRESSION: Stable cardiac enlargement and chronic lung changes but no acute pulmonary findings. Remote healed left-sided rib fractures. No definite acute fractures. Electronically Signed   By: Marijo Sanes  M.D.   On: 09/09/2016 19:00   Dg Tibia/fibula Left  Result Date: 09/09/2016 CLINICAL DATA:  [place] tonight at home.  Left leg pain. EXAM: LEFT TIBIA AND FIBULA - 2 VIEW COMPARISON:  None FINDINGS: Healed/ healing proximal fibular shaft fracture with callus formation. Small fracture line persists. No acute fracture of the tibia or fibular shaft. Ankle fractures are noted. See ankle films. IMPRESSION: Ankle fractures but no acute tibial or fibular shaft fractures. Healed/healing proximal fibular shaft fracture. Electronically Signed   By: Marijo Sanes M.D.   On:  09/09/2016 19:10   Dg Ankle Complete Left  Result Date: 09/09/2016 CLINICAL DATA:  [place] tonight at home.  Left ankle pain. EXAM: LEFT ANKLE COMPLETE - 3+ VIEW COMPARISON:  04/06/2016 FINDINGS: There are tri malleolar fractures of the ankle but these were also present on the prior film from April. No obvious acute fractures. IMPRESSION: Healing/ healed tri malleolar fractures of the ankle. No obvious acute fractures. Electronically Signed   By: Marijo Sanes M.D.   On: 09/09/2016 19:15   Dg Knee Complete 4 Views Left  Result Date: 09/09/2016 CLINICAL DATA:  [place] tonight at home.  Left knee pain. EXAM: LEFT KNEE - COMPLETE 4+ VIEW COMPARISON:  Radiographs 03/06/2015 FINDINGS: The femoral and tibial components or intact. No acute fractures identified. There is a long lateral compression sideplate and multiple screws transfixing an old distal femur fracture. No complicating features. There is also a remote healed proximal fibular shaft fracture. Extensive vascular calcifications are noted. No definite joint effusion. IMPRESSION: Intact hardware.  No acute fracture.  Remote healed fractures. Electronically Signed   By: Marijo Sanes M.D.   On: 09/09/2016 19:07   Dg Knee Complete 4 Views Right  Result Date: 09/09/2016 CLINICAL DATA:  [place] tonight at home. EXAM: RIGHT KNEE - COMPLETE 4+ VIEW COMPARISON:  None. FINDINGS: There is a periprosthetic fracture involving the lateral femoral condyle with mild displacement. Could not exclude a subtle fracture of the medial femoral condyle also. There is an associated joint effusion. IMPRESSION: Periprosthetic femoral fracture. Electronically Signed   By: Marijo Sanes M.D.   On: 09/09/2016 19:06    EKG:  atrial fibrillation 87 bpm with normal axis and nonspecific ST-T wave changes   IMPRESSION AND PLAN:  This is a 82 y.o. female with a history of  HTN, atrial fibrillation, CHF, diabetes, end-stage renal disease on hemodialysis, HTN, OSA now being admitted  with 1.  Hyponatremia/hyperkalemia-admit for monitoring, hemodialysis in a.m. Neprho consult 2.  Right periprosthetic femur fracture secondary to fall-bed rest, DVT prophylaxis, orthopedics and physical therapy consultation. 3. Hypotension-continue midodrine and monitor on telemetry. 4. Hypoxia-patient has no known pulmonary conditions at the exception of obstructive sleep apnea and has no home O2 requirement. Her chest x-ray reveals chronic changes. We will provide her home CPAP, O2 therapy as needed, nebulizers as needed and monitor. Given leukocytosis there is concern for pneumonia however the patient has not presented with any such symptoms. We'll monitor. 5. History of atrial fibrillation, rate controlled- monitor on tele 6. History of diabetes- ISS coverage 7. History of end-stage renal disease on hemodialysis- continue Phoslo, Ca, Renvela 8. History of anemia-monitor CBC 9. History of osteoarthritis- continue Tramadol,  10. History of CHF - continue Propranolol  11. History of GERD - continue Protonix 12. History of gout - continue Allopurinol  Diet/Nutrition: Heart healthy, carb controlled Hep-Lock DVT Px: Heparin, SCDs and early ambulation Code Status: Full this is discussed with the patient and her  husband at bedside. She states that she would like like sitting measures to be attempted but she would not want to be dependent on artificial means.   All the records are reviewed and case discussed with ED provider. Management plans discussed with the patient and/or family who express understanding and agree with plan of care.   TOTAL TIME TAKING CARE OF THIS PATIENT: 60 minutes.   Deshonda Cryderman D.O. on 09/10/2016 at 1:09 AM Between 7am to 6pm - Pager - [phone] After 6pm go to [email or web address] - [employer] [place] [facility] Office [phone] CC: Primary care physician; Rusty Aus, MD     Note: This dictation was prepared with Dragon  dictation along with smaller phrase technology. Any transcriptional errors that result from this process are unintentional.

## 2016-09-10 NOTE — Progress Notes (Signed)
[facility] [place] NAME: [name]    MR#:  [id number]  DATE OF BIRTH:  [birth year]  SUBJECTIVE:   Patient presented to the hospital secondary to a fall and noted to have a periprosthetic femoral fracture. Patient also noted to be hypotensive, Hyperkalemia, Hyponatremia, and Hypoxic.  Remain hypotensive this a.m. And on high dose Neosynephrine.  Getting HD.    REVIEW OF SYSTEMS:    Review of Systems  Constitutional: Negative for chills and fever.  HENT: Negative for congestion and tinnitus.   Eyes: Negative for blurred vision and double vision.  Respiratory: Negative for cough, shortness of breath and wheezing.   Cardiovascular: Negative for chest pain, orthopnea and PND.  Gastrointestinal: Negative for abdominal pain, diarrhea, nausea and vomiting.  Genitourinary: Negative for dysuria and hematuria.  Neurological: Positive for weakness. Negative for dizziness, sensory change and focal weakness.  All other systems reviewed and are negative.   Nutrition: Heart Healthy/Carb modified Tolerating Diet: Yes Tolerating PT: Await Eval.    DRUG ALLERGIES:   Allergies  Allergen Reactions  . Angiotensin Receptor Blockers Other (See Comments)    Reaction:  Hyperkalemia  . Penicillins Rash and Other (See Comments)    rash    VITALS:  Blood pressure 115/84, pulse (!) 107, temperature 97.4 F (36.3 C), temperature source Axillary, resp. rate (!) 30, height 5\' 1"  (1.549 m), weight 99 kg (218 lb 4.1 oz), SpO2 100 %.  PHYSICAL EXAMINATION:   Physical Exam  GENERAL:  82 y.o.-year-old patient lying in the bed lethargic but follows commands.    EYES: Pupils equal, round, reactive to light and accommodation. No scleral icterus. Extraocular muscles intact.  HEENT: Head atraumatic, normocephalic. Oropharynx and nasopharynx clear.  NECK:  Supple, no jugular venous distention. No thyroid enlargement, no tenderness.  LUNGS: Normal breath sounds  bilaterally, no wheezing, rales, rhonchi. No use of accessory muscles of respiration.  CARDIOVASCULAR: S1, S2 normal. No murmurs, rubs, or gallops.  ABDOMEN: Soft, nontender, nondistended. Bowel sounds present. No organomegaly or mass.  EXTREMITIES: No cyanosis, clubbing or edema b/l.    NEUROLOGIC: Cranial nerves II through XII are intact. No focal Motor or sensory deficits b/l.   PSYCHIATRIC: The patient is alert and oriented x 2.   SKIN: No obvious rash, lesion, or ulcer.   Left upper extremity AV fistula with good bruit and good thrill.   LABORATORY PANEL:   CBC  Recent Labs Lab 09/10/16 0623  WBC 16.2*  HGB 12.2  HCT 38.3  PLT 148*   ------------------------------------------------------------------------------------------------------------------  Chemistries   Recent Labs Lab 09/10/16 0029 09/10/16 0623 09/10/16 1350  NA  --  125*  --   K  --  6.4* 2.6*  CL  --  97*  --   CO2  --  14*  --   GLUCOSE  --  214*  --   BUN  --  45*  --   CREATININE  --  4.31*  --   CALCIUM  --  8.8*  --   MG 1.8  --   --   AST  --  37  --   ALT  --  14  --   ALKPHOS  --  245*  --   BILITOT  --  2.2*  --    ------------------------------------------------------------------------------------------------------------------  Cardiac Enzymes  Recent Labs Lab 09/10/16 1145  TROPONINI 0.20*   ------------------------------------------------------------------------------------------------------------------  RADIOLOGY:  Dg Chest 2 View  Result Date: 09/09/2016 CLINICAL DATA:  [employer]. EXAM: CHEST  2 VIEW COMPARISON:  04/07/2016 FINDINGS: The heart is enlarged but stable. Mediastinal and hilar contours are slightly prominent but unchanged. The lungs are clear of acute process. Stable right basilar scarring changes mild chronic right-sided pleural thickening. Remote healed rib fractures are noted on the left side. No definite acute fractures. IMPRESSION: Stable cardiac  enlargement and chronic lung changes but no acute pulmonary findings. Remote healed left-sided rib fractures. No definite acute fractures. Electronically Signed   By: Marijo Sanes M.D.   On: 09/09/2016 19:00   Dg Tibia/fibula Left  Result Date: 09/09/2016 CLINICAL DATA:  [place] tonight at home.  Left leg pain. EXAM: LEFT TIBIA AND FIBULA - 2 VIEW COMPARISON:  None FINDINGS: Healed/ healing proximal fibular shaft fracture with callus formation. Small fracture line persists. No acute fracture of the tibia or fibular shaft. Ankle fractures are noted. See ankle films. IMPRESSION: Ankle fractures but no acute tibial or fibular shaft fractures. Healed/healing proximal fibular shaft fracture. Electronically Signed   By: Marijo Sanes M.D.   On: 09/09/2016 19:10   Dg Ankle Complete Left  Result Date: 09/09/2016 CLINICAL DATA:  [place] tonight at home.  Left ankle pain. EXAM: LEFT ANKLE COMPLETE - 3+ VIEW COMPARISON:  04/06/2016 FINDINGS: There are tri malleolar fractures of the ankle but these were also present on the prior film from April. No obvious acute fractures. IMPRESSION: Healing/ healed tri malleolar fractures of the ankle. No obvious acute fractures. Electronically Signed   By: Marijo Sanes M.D.   On: 09/09/2016 19:15   Dg Chest Port 1 View  Result Date: 09/10/2016 CLINICAL DATA:  Encounter for line placement. Atrial fibrillation, CHF, diabetes and hypertension. EXAM: PORTABLE CHEST 1 VIEW COMPARISON:  Chest x-ray from earlier same day. FINDINGS: Compared to plain film from earlier same day, the right IJ central line has been advanced with tip now at the level of the upper SVC. Cardiomegaly appears stable. Overall cardiomediastinal silhouette appears stable in size and configuration. Mild atelectasis again noted at the right lung base. Lungs otherwise clear. No pleural effusion or pneumothorax seen. Osseous structures about the chest are unremarkable. IMPRESSION: 1. Right IJ central line slightly advanced  in the interval with tip now at the level of the upper SVC. Would consider additional line advancement of approximately 5 cm for more optimal radiographic positioning at the expected location of the cavoatrial junction. 2. Stable cardiomegaly. 3. Stable mild atelectasis at the right lung base. Lungs otherwise clear. No pneumothorax. Electronically Signed   By: Franki Cabot M.D.   On: 09/10/2016 14:40   Dg Chest Port 1 View  Result Date: 09/10/2016 CLINICAL DATA:  Central line placement. EXAM: PORTABLE CHEST 1 VIEW COMPARISON:  09/10/2016 chest x-ray FINDINGS: The right IJ central venous catheter tip is in the proximal SVC. The cardiac silhouette, mediastinal and hilar contours are stable. The lungs are stable. No pneumothorax. IMPRESSION: Right IJ central venous catheter tip is in the proximal SVC. Electronically Signed   By: Marijo Sanes M.D.   On: 09/10/2016 12:22   Dg Chest Port 1 View  Result Date: 09/10/2016 CLINICAL DATA:  Acute respiratory failure EXAM: PORTABLE CHEST 1 VIEW COMPARISON:  09/09/2016 FINDINGS: Cardiac enlargement. Negative for heart failure. Negative for edema. Small right pleural effusion or pleural scarring is similar to prior studies. Mild elevation right hemidiaphragm with mild right lower lobe atelectasis similar to prior studies. Little change from yesterday IMPRESSION: No change from yesterday. Right lower lobe atelectasis/ scarring and  small right pleural effusion unchanged. Electronically Signed   By: Franchot Gallo M.D.   On: 09/10/2016 09:05   Dg Knee Complete 4 Views Left  Result Date: 09/09/2016 CLINICAL DATA:  [place] tonight at home.  Left knee pain. EXAM: LEFT KNEE - COMPLETE 4+ VIEW COMPARISON:  Radiographs 03/06/2015 FINDINGS: The femoral and tibial components or intact. No acute fractures identified. There is a long lateral compression sideplate and multiple screws transfixing an old distal femur fracture. No complicating features. There is also a remote healed  proximal fibular shaft fracture. Extensive vascular calcifications are noted. No definite joint effusion. IMPRESSION: Intact hardware.  No acute fracture.  Remote healed fractures. Electronically Signed   By: Marijo Sanes M.D.   On: 09/09/2016 19:07   Dg Knee Complete 4 Views Right  Result Date: 09/09/2016 CLINICAL DATA:  [place] tonight at home. EXAM: RIGHT KNEE - COMPLETE 4+ VIEW COMPARISON:  None. FINDINGS: There is a periprosthetic fracture involving the lateral femoral condyle with mild displacement. Could not exclude a subtle fracture of the medial femoral condyle also. There is an associated joint effusion. IMPRESSION: Periprosthetic femoral fracture. Electronically Signed   By: Marijo Sanes M.D.   On: 09/09/2016 19:06     ASSESSMENT AND PLAN:   82 year old female with past medical history of end-stage renal disease on hemodialysis, osteoarthritis, obstructive sleep apnea, hypertension, diabetes, history of CHF, atrial fibrillation who presented to the hospital due to a fall and noted to have a periprosthetic femoral fracture and also noted to be Hyperkalemic and in shock.  1. Shock-etiology unclear. - septic (vs) Hypovolemic.  No clear source identified.  - cont. Supportive care w/ vasopressors.   2. Hyperkalemia - due to ESRD on HD.  - improved w/ HD and will monitor.   3. Hyponatremia - due to hypovolemia, ESRD - follow w/ HD.   4. Right hip fracture - pt. Noted to have a peri-prothetic hip fracture on right.  - await Ortho eval.   5. Acute resp. Failure w/ Hypoxia - etiology unclear.  - CXR showing no acute pathology. Cont. O2 support.  - await CT chest to r/o PE.  - Pulm. Following.   6. ESRD on HD - cont. Dialysis as per Nephro.  - dialysis days are MWF  7. Secondary Hyperparathyroidism - cont. Phoslo, REnvela  8. GERD - cont. Protonix.    All the records are reviewed and case discussed with Care Management/Social Worker. Management plans discussed with the patient,  family and they are in agreement.  CODE STATUS: Full code  DVT Prophylaxis: Ted's & SCD's  TOTAL critical TIME TAKING CARE OF THIS PATIENT: 30 minutes.   POSSIBLE D/C IN 3-4 DAYS, DEPENDING ON CLINICAL CONDITION.   Henreitta Leber M.D on 09/10/2016 at 3:06 PM  Between 7am to 6pm - Pager - [phone]  After 6pm go to [email or web address] - Proofreader  [facility]  Office  [phone]  CC: Primary care physician; Rusty Aus, MD

## 2016-09-10 NOTE — Progress Notes (Signed)
HD STARTED  

## 2016-09-10 NOTE — Progress Notes (Signed)
This RN made Marda Stalker, NP and Dr. Ashby Dawes aware of lactic acid of 5.3 and troponin of 0.07.  Both NP and MD acknowledged.  No orders given.

## 2016-09-10 NOTE — Progress Notes (Signed)
Dr. Holley Raring paged and called back.  RN made MD aware of patient's critical potassium level of 6.4 and that patient is on 150 mcg/min of neo.  MD acknowledged and stated "okay I'm on the way there."

## 2016-09-10 NOTE — Progress Notes (Addendum)
Patient's o2 sats now 93-94% on 100% bipap.  Work of breathing has improved some.  NP spoke with [facility] doctor and we will continue to monitor patient for now. Patient continues to be drowsy but arouses to voice and tries to talk over bipap to RN.  Blood pressure more stable now of 17 mcg/min of levophed. Report given to [place], Therapist, sports.

## 2016-09-10 NOTE — Progress Notes (Signed)
ANTICOAGULATION CONSULT NOTE - Follow Up Consult  Pharmacy Consult for heparin dosing Indication: atrial fibrillation  Allergies  Allergen Reactions  . Angiotensin Receptor Blockers Other (See Comments)    Reaction:  Hyperkalemia  . Penicillins Rash and Other (See Comments)    rash    Patient Measurements: Height: 5\' 1"  (154.9 cm) Weight: 218 lb 4.1 oz (99 kg) IBW/kg (Calculated) : 47.8 Heparin Dosing Weight: 71.5  Vital Signs: Temp: 98.2 F (36.8 C) (09/29 1755) Temp Source: Axillary (09/29 1755) BP: 93/41 (09/29 1900) Pulse Rate: 96 (09/29 1900)  Labs:  Recent Labs  09/09/16 1754 09/10/16 0029 09/10/16 0623 09/10/16 1145  HGB 11.8*  --  12.2  --   HCT 35.2  --  38.3  --   PLT 159  --  148*  --   APTT  --   --   --  44*  LABPROT  --   --   --  15.7*  INR  --   --   --  1.24  CREATININE 4.11*  --  4.31*  --   TROPONINI 0.03* 0.03* 0.07* 0.20*    Estimated Creatinine Clearance: 11.2 mL/min (by C-G formula based on SCr of 4.31 mg/dL (H)).   Medications:  Scheduled:  . allopurinol  100 mg Oral Daily  . azithromycin  500 mg Intravenous Q24H  . calcium acetate  667 mg Oral BID BM  . calcium-vitamin D  1 tablet Oral BID WC  . cefTRIAXone (ROCEPHIN)  IV  1 g Intravenous Once  . [START ON 09/11/2016] cefTRIAXone (ROCEPHIN)  IV  1 g Intravenous Q24H  . cholecalciferol  1,000 Units Oral Daily  . docusate sodium  100 mg Oral BID  . fluticasone  2 spray Each Nare QHS  . insulin aspart  0-20 Units Subcutaneous TID WC  . insulin aspart  0-5 Units Subcutaneous QHS  . mouth rinse  15 mL Mouth Rinse q12n4p  . midodrine  5 mg Oral TID WC  . multivitamin with minerals  1 tablet Oral Daily  . pantoprazole  40 mg Oral QAC breakfast  . polyethylene glycol  17 g Oral Daily  . propranolol  80 mg Oral BID  . sevelamer carbonate  1,600 mg Oral TID WC  . sevelamer carbonate  800 mg Oral BID BM  . sodium chloride flush  10-40 mL Intracatheter Q12H  . sodium chloride flush  3  mL Intravenous Q12H  . sodium chloride flush  3 mL Intravenous Q12H   Infusions:  . heparin 1,200 Units/hr (09/10/16 1405)  . norepinephrine (LEVOPHED) Adult infusion 17 mcg/min (09/10/16 1900)  . phenylephrine (NEO-SYNEPHRINE) Adult infusion Stopped (09/10/16 1720)    Assessment: Pharmacy has been consulted to dose heparin in this 49 yoF with PE/afib. Pt is currently on heparin drip for PE, consult was canceled and a new consult was placed to dose heparin for afib.   Goal of Therapy:  Heparin level 0.3-0.7 units/ml Monitor platelets by anticoagulation protocol: Yes   Plan:  No bolus Since target HL is the same for PE and afib, pt will remain on heparin infusion at 1200 units/hr Will check anti-Xa levels 8 hours after start of drip Continue to monitor H&H and platelets  Darrow Bussing, PharmD Pharmacy Resident 09/10/2016 7:25 PM

## 2016-09-10 NOTE — Progress Notes (Signed)
Central line advanced and xray done to check placement.  NP and Dr. Ashby Dawes gave order okay to use central line.

## 2016-09-10 NOTE — Progress Notes (Addendum)
Responded to Rapid response call. Pt on 3.5L [place] with O2 sat of 98%. Pt transferred to CCU and placed on BIPAP for respiratory support. Pt wear home CPAP for sleep. Pt is tolerating well.

## 2016-09-10 NOTE — Progress Notes (Signed)
ANTICOAGULATION CONSULT NOTE - Follow Up Consult  Pharmacy Consult for heparin dosing Indication: atrial fibrillation  Allergies  Allergen Reactions  . Angiotensin Receptor Blockers Other (See Comments)    Reaction:  Hyperkalemia  . Penicillins Rash and Other (See Comments)    rash    Patient Measurements: Height: 5\' 1"  (154.9 cm) Weight: 218 lb 4.1 oz (99 kg) IBW/kg (Calculated) : 47.8 Heparin Dosing Weight: 71.5  Vital Signs: Temp: 98.2 F (36.8 C) (09/29 1755) Temp Source: Axillary (09/29 1755) BP: 96/60 (09/29 2030) Pulse Rate: 98 (09/29 2030)  Labs:  Recent Labs  09/09/16 1754 09/10/16 0029 09/10/16 0623 09/10/16 1145 09/10/16 2223  HGB 11.8*  --  12.2  --   --   HCT 35.2  --  38.3  --   --   PLT 159  --  148*  --   --   APTT  --   --   --  44*  --   LABPROT  --   --   --  15.7*  --   INR  --   --   --  1.24  --   HEPARINUNFRC  --   --   --   --  0.52  CREATININE 4.11*  --  4.31*  --   --   TROPONINI 0.03* 0.03* 0.07* 0.20*  --     Estimated Creatinine Clearance: 11.2 mL/min (by C-G formula based on SCr of 4.31 mg/dL (H)).   Medications:  Scheduled:  . allopurinol  100 mg Oral Daily  . azithromycin  500 mg Intravenous Q24H  . calcium acetate  667 mg Oral BID BM  . calcium-vitamin D  1 tablet Oral BID WC  . [START ON 09/11/2016] cefTRIAXone (ROCEPHIN)  IV  1 g Intravenous Q24H  . cholecalciferol  1,000 Units Oral Daily  . docusate sodium  100 mg Oral BID  . fluticasone  2 spray Each Nare QHS  . haloperidol lactate  5 mg Intravenous Once  . insulin aspart  0-20 Units Subcutaneous TID WC  . insulin aspart  0-5 Units Subcutaneous QHS  . mouth rinse  15 mL Mouth Rinse q12n4p  . midodrine  5 mg Oral TID WC  . multivitamin with minerals  1 tablet Oral Daily  . pantoprazole  40 mg Oral QAC breakfast  . polyethylene glycol  17 g Oral Daily  . propranolol  80 mg Oral BID  . sevelamer carbonate  1,600 mg Oral TID WC  . sevelamer carbonate  800 mg Oral BID  BM  . sodium chloride flush  10-40 mL Intracatheter Q12H  . sodium chloride flush  3 mL Intravenous Q12H  . sodium chloride flush  3 mL Intravenous Q12H   Infusions:  . heparin 1,200 Units/hr (09/10/16 2000)  . norepinephrine (LEVOPHED) Adult infusion 15.04 mcg/min (09/10/16 2000)  . phenylephrine (NEO-SYNEPHRINE) Adult infusion Stopped (09/10/16 1720)    Assessment: Pharmacy has been consulted to dose heparin in this 38 yoF with PE/afib. Pt is currently on heparin drip for PE, consult was canceled and a new consult was placed to dose heparin for afib.   Goal of Therapy:  Heparin level 0.3-0.7 units/ml Monitor platelets by anticoagulation protocol: Yes   Plan:  No bolus Since target HL is the same for PE and afib, pt will remain on heparin infusion at 1200 units/hr Will check anti-Xa levels 8 hours after start of drip Continue to monitor H&H and platelets  09/10/2016 2223 heparin level therapeutic. Continue current rate.  Will recheck heparin level in 8 hours.  Romyn Boswell A. [place], [place].D., BCPS Clinical Pharmacist 09/10/2016 11:40 PM

## 2016-09-11 ENCOUNTER — Inpatient Hospital Stay: Payer: Medicare Other

## 2016-09-11 DIAGNOSIS — I4891 Unspecified atrial fibrillation: Secondary | ICD-10-CM

## 2016-09-11 DIAGNOSIS — S72424A Nondisplaced fracture of lateral condyle of right femur, initial encounter for closed fracture: Secondary | ICD-10-CM

## 2016-09-11 DIAGNOSIS — E871 Hypo-osmolality and hyponatremia: Secondary | ICD-10-CM

## 2016-09-11 LAB — BASIC METABOLIC PANEL
ANION GAP: 13 (ref 5–15)
BUN: 26 mg/dL — ABNORMAL HIGH (ref 6–20)
CO2: 23 mmol/L (ref 22–32)
Calcium: 8.9 mg/dL (ref 8.9–10.3)
Chloride: 93 mmol/L — ABNORMAL LOW (ref 101–111)
Creatinine, Ser: 3.21 mg/dL — ABNORMAL HIGH (ref 0.44–1.00)
GFR, EST AFRICAN AMERICAN: 15 mL/min — AB (ref >60)
GFR, EST NON AFRICAN AMERICAN: 13 mL/min — AB (ref >60)
GLUCOSE: 98 mg/dL (ref 65–99)
POTASSIUM: 4.1 mmol/L (ref 3.5–5.1)
SODIUM: 129 mmol/L — AB (ref 135–145)

## 2016-09-11 LAB — ECHOCARDIOGRAM COMPLETE
HEIGHTINCHES: 61 in
WEIGHTICAEL: 3492.09 [oz_av]

## 2016-09-11 LAB — CBC WITH DIFFERENTIAL/PLATELET
Basophils Absolute: 0.1 10*3/uL (ref 0–0.1)
Basophils Relative: 1 %
EOS ABS: 0.1 10*3/uL (ref 0–0.7)
EOS PCT: 0 %
HCT: 33.8 % — ABNORMAL LOW (ref 35.0–47.0)
Hemoglobin: 11.1 g/dL — ABNORMAL LOW (ref 12.0–16.0)
LYMPHS ABS: 2.3 10*3/uL (ref 1.0–3.6)
Lymphocytes Relative: 11 %
MCH: 33 pg (ref 26.0–34.0)
MCHC: 33 g/dL (ref 32.0–36.0)
MCV: 100.2 fL — ABNORMAL HIGH (ref 80.0–100.0)
Monocytes Absolute: 1.8 10*3/uL — ABNORMAL HIGH (ref 0.2–0.9)
Monocytes Relative: 8 %
Neutro Abs: 17.5 10*3/uL — ABNORMAL HIGH (ref 1.4–6.5)
Neutrophils Relative %: 80 %
PLATELETS: 148 10*3/uL — AB (ref 150–440)
RBC: 3.37 MIL/uL — AB (ref 3.80–5.20)
RDW: 15.9 % — ABNORMAL HIGH (ref 11.5–14.5)
WBC: 21.8 10*3/uL — AB (ref 3.6–11.0)

## 2016-09-11 LAB — GLUCOSE, CAPILLARY
GLUCOSE-CAPILLARY: 104 mg/dL — AB (ref 65–99)
Glucose-Capillary: 115 mg/dL — ABNORMAL HIGH (ref 65–99)
Glucose-Capillary: 126 mg/dL — ABNORMAL HIGH (ref 65–99)
Glucose-Capillary: 167 mg/dL — ABNORMAL HIGH (ref 65–99)

## 2016-09-11 LAB — PHOSPHORUS: Phosphorus: 4.1 mg/dL (ref 2.5–4.6)

## 2016-09-11 LAB — MAGNESIUM: MAGNESIUM: 1.8 mg/dL (ref 1.7–2.4)

## 2016-09-11 LAB — HEPARIN LEVEL (UNFRACTIONATED): Heparin Unfractionated: 0.64 IU/mL (ref 0.30–0.70)

## 2016-09-11 LAB — PROCALCITONIN: PROCALCITONIN: 5.47 ng/mL

## 2016-09-11 MED ORDER — DEXMEDETOMIDINE HCL IN NACL 400 MCG/100ML IV SOLN
0.2000 ug/kg/h | INTRAVENOUS | Status: DC
  Administered 2016-09-11: 0.3 ug/kg/h via INTRAVENOUS
  Filled 2016-09-11: qty 100

## 2016-09-11 MED ORDER — DEXMEDETOMIDINE HCL IN NACL 400 MCG/100ML IV SOLN
0.4000 ug/kg/h | INTRAVENOUS | Status: DC
  Administered 2016-09-11: 0.5 ug/kg/h via INTRAVENOUS
  Administered 2016-09-11: 0.4 ug/kg/h via INTRAVENOUS
  Administered 2016-09-12: 0.2 ug/kg/h via INTRAVENOUS
  Administered 2016-09-12: 0.4 ug/kg/h via INTRAVENOUS
  Administered 2016-09-12: 0.5 ug/kg/h via INTRAVENOUS
  Filled 2016-09-11: qty 100

## 2016-09-11 MED ORDER — MORPHINE SULFATE (PF) 2 MG/ML IV SOLN
2.0000 mg | INTRAVENOUS | Status: DC | PRN
  Administered 2016-09-11 – 2016-09-14 (×4): 2 mg via INTRAVENOUS
  Filled 2016-09-11 (×4): qty 1

## 2016-09-11 MED ORDER — HYDROCORTISONE NA SUCCINATE PF 100 MG IJ SOLR
50.0000 mg | Freq: Three times a day (TID) | INTRAMUSCULAR | Status: DC
  Administered 2016-09-11 – 2016-09-13 (×6): 50 mg via INTRAVENOUS
  Filled 2016-09-11 (×8): qty 2

## 2016-09-11 NOTE — Progress Notes (Signed)
Spoke with Dr Rockne Menghini, Patients heart rate in the upper 130's. Patient is grimacing orders for morphine. Family at patients bedside.

## 2016-09-11 NOTE — Progress Notes (Signed)
Pharmacy Antibiotic Note  [name] is a 82 y.o. female admitted on 09/09/2016 with pneumonia.  Pharmacy has been consulted for ceftriaxone dosing. Patient also initiated on azithromycin 500mg  IV Q24hr.    Plan: Ceftriaxone 1g IV q 24 hours.  Height: 5\' 1"  (154.9 cm) Weight: 218 lb 4.1 oz (99 kg) IBW/kg (Calculated) : 47.8  Temp (24hrs), Avg:98 F (36.7 C), Min:97 F (36.1 C), Max:98.8 F (37.1 C)   Recent Labs Lab 09/09/16 1754 09/10/16 0623 09/10/16 0839 09/10/16 1145 09/11/16 0212  WBC 15.2* 16.2*  --   --  21.8*  CREATININE 4.11* 4.31*  --   --  3.21*  LATICACIDVEN  --   --  5.3* 4.1*  --     Estimated Creatinine Clearance: 15.1 mL/min (by C-G formula based on SCr of 3.21 mg/dL (H)).    Allergies  Allergen Reactions  . Angiotensin Receptor Blockers Other (See Comments)    Reaction:  Hyperkalemia  . Penicillins Rash and Other (See Comments)    rash    Antimicrobials this admission: Azithromycin 9/29 >>  Ceftriaxone 9/29 >>    Microbiology results: 9/29 BCx: no growth < 24 hours.  9/29 Sputum: Sent  9/29 MRSA PCR: Positive  Thank you for allowing pharmacy to be a part of this patient's care.  MLS 09/11/2016 9:21 AM
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
## 2016-09-11 NOTE — Consult Note (Addendum)
PULMONARY / CRITICAL CARE MEDICINE   Name: [name] MRN: [id number] DOB: 05-21-1934    ADMISSION DATE:  09/09/2016    SUBJECTIVE:  Pt minimally responsive today on bipap, opens eyes, does not follow commands.   REVIEW OF SYSTEMS:  Could not obtain due to critical illness.   VITAL SIGNS: BP (!) 104/93   Pulse (!) 103   Temp 98.3 F (36.8 C)   Resp (!) 21   Ht 5\' 1"  (1.549 m)   Wt 218 lb 4.1 oz (99 kg)   SpO2 95%   BMI 41.24 kg/m   HEMODYNAMICS: CVP:  [27 mmHg] 27 mmHg  VENTILATOR SETTINGS: FiO2 (%):  [50 %-100 %] 100 %  INTAKE / OUTPUT: I/O last 3 completed shifts: In: 3311.9 [I.V.:3311.9] Out: 84 [Other:285]  PHYSICAL EXAMINATION: General:  Acutely ill appearing Caucasian female Neuro:  Lethargic,  PERRL HEENT:  JVD present, normocephalic  Cardiovascular: atrial fibrillation, no M/R/G Lungs:  Course throughout, even, labored Abdomen:  +BS x4, obese, soft non tender Musculoskeletal: right lower extremity immobilizer, 2+ bilateral lower extremity edema Skin: intact, no rashes or lesions  LABS:  BMET  Recent Labs Lab 09/09/16 1754 09/10/16 0623 09/10/16 1350 09/10/16 1741 09/11/16 0212  NA 125* 125*  --   --  129*  K 5.6* 6.4* 2.6* 3.6 4.1  CL 95* 97*  --   --  93*  CO2 23 14*  --   --  23  BUN 41* 45*  --   --  26*  CREATININE 4.11* 4.31*  --   --  3.21*  GLUCOSE 153* 214*  --   --  98    Electrolytes  Recent Labs Lab 09/09/16 1754 09/10/16 0029 09/10/16 0623 09/10/16 1005 09/11/16 0212  CALCIUM 8.8*  --  8.8*  --  8.9  MG  --  1.8  --   --  1.8  PHOS  --  4.1  --  5.7* 4.1    CBC  Recent Labs Lab 09/09/16 1754 09/10/16 0623 09/11/16 0212  WBC 15.2* 16.2* 21.8*  HGB 11.8* 12.2 11.1*  HCT 35.2 38.3 33.8*  PLT 159 148* 148*    Coag's  Recent Labs Lab 09/10/16 1145  APTT 44*  INR 1.24    Sepsis Markers  Recent Labs Lab 09/10/16 0839 09/10/16 1145 09/11/16 0658  LATICACIDVEN 5.3* 4.1*  --   PROCALCITON  <0.10  --  5.47    ABG  Recent Labs Lab 09/10/16 1200 09/10/16 2253  PHART 7.40 7.43  PCO2ART 41 35  PO2ART 53* 230*    Liver Enzymes  Recent Labs Lab 09/09/16 1754 09/10/16 0623  AST 40 37  ALT 13* 14  ALKPHOS 260* 245*  BILITOT 1.9* 2.2*  ALBUMIN 3.2* 3.2*    Cardiac Enzymes  Recent Labs Lab 09/10/16 0029 09/10/16 0623 09/10/16 1145  TROPONINI 0.03* 0.07* 0.20*    Glucose  Recent Labs Lab 09/10/16 0141 09/10/16 0741 09/10/16 1158 09/10/16 1630 09/10/16 2117 09/11/16 0720  GLUCAP 159* 245* 183* 129* 85 104*    Imaging Ct Angio Chest Pe W Or Wo Contrast  Result Date: 09/10/2016 CLINICAL DATA:  Acute respiratory failure with hypoxia. EXAM: CT ANGIOGRAPHY CHEST WITH CONTRAST TECHNIQUE: Multidetector CT imaging of the chest was performed using the standard protocol during bolus administration of intravenous contrast. Multiplanar CT image reconstructions and MIPs were obtained to evaluate the vascular anatomy. CONTRAST:  1 ISOVUE-300 IOPAMIDOL (ISOVUE-300) INJECTION 61% COMPARISON:  04/06/2016 FINDINGS: Cardiovascular: Moderate cardiac enlargement. Calcifications  involving the thoracic aorta noted. Left circumflex coronary artery calcifications. Mitral valve calcifications noted. No pericardial effusion. Pulmonary artery detail diminished secondary to respiratory motion artifact. There is no central pulmonary artery filling defect identified. No lobar or segmental pulmonary artery filling defects identified to suggest a clinically significant acute pulmonary embolus. Mediastinum/Nodes: The trachea appears patent and is midline. Normal appearance of the esophagus. No mediastinal or hilar adenopathy. Lungs/Pleura: There is no pleural effusion identified. No interstitial edema identified. Subpleural consolidation in atelectasis is identified within the right lower lobe. Upper Abdomen: There is reflux of contrast material from the right side of heart into the IVC and  hepatic veins compatible with passive venous congestion. No acute findings identified within the upper abdomen. Musculoskeletal: Spondylosis is identified within the thoracic spine. Chronic posttraumatic deformity involving the sternum noted. Review of the MIP images confirms the above findings. IMPRESSION: 1. Exam detail diminished by respiratory motion artifact. Within this limitation there is no evidence to suggest an acute pulmonary embolus. 2. Left lower lobe subpleural consolidation and atelectasis. Findings may reflect an inflammatory or infectious process. 3. Aortic atherosclerosis and coronary artery calcifications Electronically Signed   By: Kerby Moors M.D.   On: 09/10/2016 17:38   US Venous Img Lower Bilateral  Result Date: 09/11/2016 CLINICAL DATA:  Bilateral lower extremity edema. History of CHF and varicose veins. History of skin cancer. Evaluate for DVT EXAM: BILATERAL LOWER EXTREMITY VENOUS DOPPLER ULTRASOUND TECHNIQUE: Gray-scale sonography with graded compression, as well as color Doppler and duplex ultrasound were performed to evaluate the lower extremity deep venous systems from the level of the common femoral vein and including the common femoral, femoral, profunda femoral, popliteal and calf veins including the posterior tibial, peroneal and gastrocnemius veins when visible. The superficial great saphenous vein was also interrogated. Spectral Doppler was utilized to evaluate flow at rest and with distal augmentation maneuvers in the common femoral, femoral and popliteal veins. COMPARISON:  None. FINDINGS: Examination is degraded due to patient body habitus, subcutaneous edema and poor sonographic window. RIGHT LOWER EXTREMITY Common Femoral Vein: No evidence of thrombus. Normal compressibility, respiratory phasicity and response to augmentation. Saphenofemoral Junction: No evidence of thrombus. Normal compressibility and flow on color Doppler imaging. Profunda Femoral Vein: No evidence  of thrombus. Normal compressibility and flow on color Doppler imaging. Femoral Vein: No evidence of thrombus. Normal compressibility, respiratory phasicity and response to augmentation. Popliteal Vein: No evidence of thrombus. Normal compressibility, respiratory phasicity and response to augmentation. Calf Veins: No evidence of thrombus. Normal compressibility and flow on color Doppler imaging. Superficial Great Saphenous Vein: No evidence of thrombus. Normal compressibility and flow on color Doppler imaging. Venous Reflux:  None. Other Findings:  None. LEFT LOWER EXTREMITY Common Femoral Vein: No evidence of thrombus. Normal compressibility, respiratory phasicity and response to augmentation. Saphenofemoral Junction: No evidence of thrombus. Normal compressibility and flow on color Doppler imaging. Profunda Femoral Vein: No evidence of thrombus. Normal compressibility and flow on color Doppler imaging. Femoral Vein: No evidence of thrombus. Normal compressibility, respiratory phasicity and response to augmentation. Popliteal Vein: No evidence of thrombus. Normal compressibility, respiratory phasicity and response to augmentation. Calf Veins: Suboptimally visualized. Superficial Great Saphenous Vein: No evidence of thrombus. Normal compressibility and flow on color Doppler imaging. Venous Reflux:  None. Other Findings:  None. IMPRESSION: No evidence of DVT within either lower extremity. Electronically Signed   By: Sandi Mariscal M.D.   On: 09/11/2016 08:43   Dg Chest Port 1 View  Result Date: 09/11/2016  CLINICAL DATA:  Acute respiratory failure EXAM: PORTABLE CHEST 1 VIEW COMPARISON:  September 10, 2016 FINDINGS: Stable right IJ. Stable opacity in the right base with platelike configuration. Elevation of the right hemidiaphragm is unchanged as well. Stable cardiomegaly. The hila and mediastinum are otherwise unchanged. No other interval changes. IMPRESSION: No interval changes. Stable opacity in the right lung base  most consistent with atelectasis. Electronically Signed   By: Gerome Sam III M.D   On: 09/11/2016 07:28   Dg Chest Port 1 View  Result Date: 09/10/2016 CLINICAL DATA:  Acute onset of hypoxia.  Initial encounter. EXAM: PORTABLE CHEST 1 VIEW COMPARISON:  Chest radiograph and CTA of the chest performed earlier today at 2:06 p.m. and 5:10 p.m. FINDINGS: A right IJ line is noted ending about the mid SVC. The lungs are hypoexpanded. Mild right basilar atelectasis is noted. There is no evidence of pleural effusion or pneumothorax. The cardiomediastinal silhouette is mildly enlarged. No acute osseous abnormalities are seen. IMPRESSION: 1. Right IJ line noted ending about the mid SVC. 2. Lungs hypoexpanded.  Mild right basilar atelectasis noted. 3. Mild cardiomegaly. Electronically Signed   By: Roanna Raider M.D.   On: 09/10/2016 23:21   Dg Chest Port 1 View  Result Date: 09/10/2016 CLINICAL DATA:  Encounter for line placement. Atrial fibrillation, CHF, diabetes and hypertension. EXAM: PORTABLE CHEST 1 VIEW COMPARISON:  Chest x-ray from earlier same day. FINDINGS: Compared to plain film from earlier same day, the right IJ central line has been advanced with tip now at the level of the upper SVC. Cardiomegaly appears stable. Overall cardiomediastinal silhouette appears stable in size and configuration. Mild atelectasis again noted at the right lung base. Lungs otherwise clear. No pleural effusion or pneumothorax seen. Osseous structures about the chest are unremarkable. IMPRESSION: 1. Right IJ central line slightly advanced in the interval with tip now at the level of the upper SVC. Would consider additional line advancement of approximately 5 cm for more optimal radiographic positioning at the expected location of the cavoatrial junction. 2. Stable cardiomegaly. 3. Stable mild atelectasis at the right lung base. Lungs otherwise clear. No pneumothorax. Electronically Signed   By: Bary Richard M.D.   On:  09/10/2016 14:40   Dg Chest Port 1 View  Result Date: 09/10/2016 CLINICAL DATA:  Central line placement. EXAM: PORTABLE CHEST 1 VIEW COMPARISON:  09/10/2016 chest x-ray FINDINGS: The right IJ central venous catheter tip is in the proximal SVC. The cardiac silhouette, mediastinal and hilar contours are stable. The lungs are stable. No pneumothorax. IMPRESSION: Right IJ central venous catheter tip is in the proximal SVC. Electronically Signed   By: Rudie Meyer M.D.   On: 09/10/2016 12:22     STUDIES:  Right Knee xray 9/28>>Periprosthetic femoral fracture Left Knee xray 9/28>>No acute fracture, remote healed fractures Left tibia/fibula xray 9/28>>Ankle fractures but no acute tibial or fibular shaft fractures.  Healed/healing proximal fibular shaft fracture CT of chest 9/29>>  CULTURES: Blood x2 9/29>> Sputum>> MRSA PCR 9/29>>positive  ANTIBIOTICS: None  SIGNIFICANT EVENTS: 9/29-Pt admitted to [facility] with c/o fatigue post fall 9/29-Pt transferred to ICU due to hypotension requiring pressors and acute on chronic hypoxic respiratory failure secondary to CHF exacerbation and ?PE.  PCCM consulted for further management  LINES/TUBES: PIV's x2 9/29>> RIJ 9/29>>  ASSESSMENT / PLAN:  82 yo female admitted with R knee fracture of existing prosthesis, with existing ESRD on HD, now with right pneumonia with septic shock, acute systolic CHF.   PULMONARY  A: Acute on chronic hypoxic respiratory failure secondary to Pneumonia. Morbid Obesity with OSA (requires CPAP qhs) CT chest images 09/10/16 reviewed: RLL pneumonia, severe RV dilatation, c/w severe pulmonary hypertension; severely reduced lung volumes due to cardiomegaly and obesity.  P:   CT of chest today 9/29 to rule out PE Bipap prn and qhs  Supplemental O2 to maintain O2 sats 92% or above --Poor prognosis given multiple comorbidities.   CARDIOVASCULAR A:  Atrial Fibrillation ?DVT Hypotension secondary septic shock Hx:  Dysrhythmia, CHF, and hypertension P:   Hold outpatient propranolol Continue outpatient midodrine Maintain systolic blood pressure 90 or above Levophed drip to maintain systolic blood pressure goal [place] bilateral lower extremities negative for DVT 9/29  Echo 09/11/16; EF 45% (55% on Echo 08/05/15) Pulmonary hypertension with RV dilation.  Empiric steroids.   RENAL A:   ESRD (hemodialysis Monday, Wednesday, and Friday) P:   Nephrology consulted appreciate input Hemodialysis today per Nephrology Fluid restriction Trend BMP's  Replace electrolytes as indicated Hyponatremia.   GASTROINTESTINAL A:   No acute issues P:   Keep NPO for now  Continue protonix if able to tolerate po medications  HEMATOLOGIC A:   No acute issues P:  Heparin gtt per pharmacy Trend CBC Monitor for s/sx of bleeding Transfuse for hgb <7  INFECTIOUS A:   Leukocytosis-MRSA PCR positive no infiltrates present on CXR 9/29 P:   Trend lactic acid and PCT if PCT elevated will start abx Trend WBC and monitor fever curve Follow cultures  ENDOCRINE A:   Hx: Diabetes mellitus P:   Continue SSI  NEUROLOGIC A:   No acute issues P:   Avoid sedating medications Reorient pt Lights on during the day Tramadol as needed for pain  MUSCULOSKELETAL  A:  Right femoral fracture and Left ankle fracture Hx: Left tibia and fibula shaft fractures P: Orthopedic consulted appreciate input Strict bedrest Fall precautions Prn Tramadol for pain   -Marda Stalker, M.D.  09/11/2016   Critical Care Attestation.  I have personally obtained a history, examined the patient, evaluated laboratory and imaging results, formulated the assessment and plan and placed orders. The Patient requires high complexity decision making for assessment and support, frequent evaluation and titration of therapies, application of advanced monitoring technologies and extensive interpretation of multiple databases. The patient has  critical illness that could lead imminently to failure of 1 or more organ systems and requires the highest level of physician preparedness to intervene.  Critical Care Time devoted to patient care services described in this note is 55 minutes and is exclusive of time spent in procedures.

## 2016-09-11 NOTE — Progress Notes (Signed)
PT Cancellation Note  Patient Details Name: [name] MRN: [id number] DOB: [birth year]   Cancelled Treatment:    Reason Eval/Treat Not Completed: Medical issues which prohibited therapy. Chart reviewed; RN consulted. RN reports that the patient my soon require intubation due to worsening respiratory status and patient is not appropriate for PT evaluation at this time. Will hold eval at this time and attempt again at later date time as appropriate.    10:41 AM, 09/11/16 Etta Grandchild, PT, DPT Physical Therapist - [place] [phone] (mobile)

## 2016-09-11 NOTE — Progress Notes (Signed)
Discussed case with  pt's husband, son, daughter in-law, grandson.  I explained the patient has experienced a progressive downward course over the past few months. Currently has severe septic shock with ESRD, acute resp failure, acute systolic CHF, pulmonary htn. She is now minimally responsive, she has continued to decline over the past few days.  She is currently in need of intubation. We discussed that her overall prognosis is poor.  A further downward course is predictable and likely, will likely involve further hospital admissions, intubation, with further decline in functional status.  Pt's caregiver discussed the situation with her family and with the patient.    We decided that patient's code status would be DNR. They will discuss the possibility of initiating comfort measures only with other family and continue current measure to allow other family members to arrive.     -Marda Stalker, M.D.  Time spent in discussion 30 min.  09/11/2016

## 2016-09-11 NOTE — Progress Notes (Signed)
Spoke with Dr Rockne Menghini this am. Unable to give patients PO medications as she is unable to tolerate. At this point we will wait until family gets here for possible intubation per Dr Rockne Menghini.

## 2016-09-11 NOTE — Progress Notes (Signed)
[place] for heparin dosing Indication: atrial fibrillation   Assessment: Pharmacy has been consulted to dose heparin in this 56 yoF with PE/afib. Patient previously on heparin drip for PE, now being covered for afib.   Goal of Therapy:  Heparin level 0.3-0.7 units/mL Monitor platelets by anticoagulation protocol: Yes   Plan:  Anti-Xa level is in range x 2. Will obtain next anti-Xa level with am labs on 10/1.      Allergies  Allergen Reactions  . Angiotensin Receptor Blockers Other (See Comments)    Reaction:  Hyperkalemia  . Penicillins Rash and Other (See Comments)    rash    Patient Measurements: Height: 5\' 1"  (154.9 cm) Weight: 218 lb 4.1 oz (99 kg) IBW/kg (Calculated) : 47.8 Heparin Dosing Weight: 71.5  Vital Signs: Temp: 98.3 F (36.8 C) (09/30 0700) Temp Source: Axillary (09/30 0400) BP: 104/93 (09/30 0700) Pulse Rate: 112 (09/30 0700)  Labs:  Recent Labs  09/09/16 1754 09/10/16 0029 09/10/16 0623 09/10/16 1145 09/10/16 2223 09/11/16 0212 09/11/16 0658  HGB 11.8*  --  12.2  --   --  11.1*  --   HCT 35.2  --  38.3  --   --  33.8*  --   PLT 159  --  148*  --   --  148*  --   APTT  --   --   --  44*  --   --   --   LABPROT  --   --   --  15.7*  --   --   --   INR  --   --   --  1.24  --   --   --   HEPARINUNFRC  --   --   --   --  0.52  --  0.64  CREATININE 4.11*  --  4.31*  --   --  3.21*  --   TROPONINI 0.03* 0.03* 0.07* 0.20*  --   --   --     Estimated Creatinine Clearance: 15.1 mL/min (by C-G formula based on SCr of 3.21 mg/dL (H)).   Medications:  Scheduled:  . allopurinol  100 mg Oral Daily  . azithromycin  500 mg Intravenous Q24H  . calcium acetate  667 mg Oral BID BM  . calcium-vitamin D  1 tablet Oral BID WC  . cefTRIAXone (ROCEPHIN)  IV  1 g Intravenous Q24H  . cholecalciferol  1,000 Units Oral Daily  . docusate sodium  100 mg Oral BID  . fluticasone  2 spray Each Nare QHS  . insulin aspart   0-20 Units Subcutaneous TID WC  . insulin aspart  0-5 Units Subcutaneous QHS  . mouth rinse  15 mL Mouth Rinse q12n4p  . midodrine  5 mg Oral TID WC  . multivitamin with minerals  1 tablet Oral Daily  . pantoprazole  40 mg Oral QAC breakfast  . polyethylene glycol  17 g Oral Daily  . propranolol  80 mg Oral BID  . sevelamer carbonate  1,600 mg Oral TID WC  . sevelamer carbonate  800 mg Oral BID BM  . sodium chloride flush  10-40 mL Intracatheter Q12H  . sodium chloride flush  3 mL Intravenous Q12H  . sodium chloride flush  3 mL Intravenous Q12H   Infusions:  . dexmedetomidine 0.5 mcg/kg/hr (09/11/16 0716)  . heparin 1,200 Units/hr (09/11/16 0641)  . norepinephrine (LEVOPHED) Adult infusion 23 mcg/min (09/11/16 0700)  . phenylephrine (NEO-SYNEPHRINE) Adult  infusion Stopped (09/10/16 1720)    Pharmacy will continue to monitor and adjust per consult.   MLS 09/11/2016 8:13 AM

## 2016-09-11 NOTE — Progress Notes (Signed)
Patient ID: Dalena Graci, female   DOB: 10/03/1934, 82 y.o.   MRN: [id number]  [facility] PROGRESS NOTE  Takima Noorda [id number] DOB: July 17, 1934 DOA: 09/09/2016 PCP: Rusty Aus, MD  HPI/Subjective: Patient barely responsive to sternal rub with grimacing.  Patient did move her right hand and leg on her own.  Objective: Vitals:   09/11/16 0700 09/11/16 0834  BP: (!) 104/93   Pulse: (!) 112 (!) 103  Resp: (!) 21   Temp: 98.3 F (36.8 C)     Intake/Output Summary (Last 24 hours) at 09/11/16 1220 Last data filed at 09/11/16 0502  Gross per 24 hour  Intake          1214.98 ml  Output              285 ml  Net           929.98 ml   Filed Weights   09/09/16 1808 09/10/16 0124 09/10/16 1000  Weight: 95.3 kg (210 lb) 97.6 kg (215 lb 2.7 oz) 99 kg (218 lb 4.1 oz)    ROS: Review of Systems  Unable to perform ROS: Critical illness   Exam: Physical Exam  Constitutional: She appears lethargic.  HENT:  Nose: No mucosal edema.  Unable to look in the mail today.  Eyes: Lids are normal. Pupils are equal, round, and reactive to light.  Neck: No thyroid mass and no thyromegaly present.  Cardiovascular: An irregularly irregular rhythm present. Tachycardia present.   Murmur heard.  Systolic murmur is present with a grade of 2/6  Respiratory: She has decreased breath sounds in the right middle field, the right lower field, the left middle field and the left lower field. She has wheezes in the left lower field. She has rhonchi in the right lower field.  GI: Soft. Bowel sounds are normal. There is no tenderness.  Musculoskeletal:       Right ankle: She exhibits swelling.       Left ankle: She exhibits swelling.  Neurological: She appears lethargic.  With sternal rub she did try to talk and she grimaced. She did move her right foot and right hand on her own.  Skin: Skin is warm.  Bruising bilateral lower extremities  Psychiatric:  Unable to test secondary to altered mental  status      Data Reviewed: Basic Metabolic Panel:  Recent Labs Lab 09/09/16 1754 09/10/16 0029 09/10/16 0623 09/10/16 1005 09/10/16 1350 09/10/16 1741 09/11/16 0212  NA 125*  --  125*  --   --   --  129*  K 5.6*  --  6.4*  --  2.6* 3.6 4.1  CL 95*  --  97*  --   --   --  93*  CO2 23  --  14*  --   --   --  23  GLUCOSE 153*  --  214*  --   --   --  98  BUN 41*  --  45*  --   --   --  26*  CREATININE 4.11*  --  4.31*  --   --   --  3.21*  CALCIUM 8.8*  --  8.8*  --   --   --  8.9  MG  --  1.8  --   --   --   --  1.8  PHOS  --  4.1  --  5.7*  --   --  4.1   Liver Function Tests:  Recent Labs  Lab 09/09/16 1754 09/10/16 0623  AST 40 37  ALT 13* 14  ALKPHOS 260* 245*  BILITOT 1.9* 2.2*  PROT 7.1 7.2  ALBUMIN 3.2* 3.2*   No results for input(s): LIPASE, AMYLASE in the last 168 hours. No results for input(s): AMMONIA in the last 168 hours. CBC:  Recent Labs Lab 09/09/16 1754 09/10/16 0623 09/11/16 0212  WBC 15.2* 16.2* 21.8*  NEUTROABS  --   --  17.5*  HGB 11.8* 12.2 11.1*  HCT 35.2 38.3 33.8*  MCV 101.3* 104.9* 100.2*  PLT 159 148* 148*   Cardiac Enzymes:  Recent Labs Lab 09/09/16 1754 09/10/16 0029 09/10/16 0623 09/10/16 1145  TROPONINI 0.03* 0.03* 0.07* 0.20*    CBG:  Recent Labs Lab 09/10/16 1158 09/10/16 1630 09/10/16 2117 09/11/16 0720 09/11/16 1142  GLUCAP 183* 129* 85 104* 115*    Recent Results (from the past 240 hour(s))  MRSA PCR Screening     Status: Abnormal   Collection Time: 09/10/16  1:47 AM  Result Value Ref Range Status   MRSA by PCR POSITIVE (A) NEGATIVE Final    Comment:        The GeneXpert MRSA Assay (FDA approved for NASAL specimens only), is one component of a comprehensive MRSA colonization surveillance program. It is not intended to diagnose MRSA infection nor to guide or monitor treatment for MRSA infections. RESULT CALLED TO, READ BACK BY AND VERIFIED WITH: MONIQUE ROBERSON ON 09/10/16 AT 0359 BY TLB    CULTURE, BLOOD (ROUTINE X 2) w Reflex to ID Panel     Status: None (Preliminary result)   Collection Time: 09/10/16  8:19 AM  Result Value Ref Range Status   Specimen Description BLOOD RIGHT HAND  Final   Special Requests BOTTLES DRAWN AEROBIC AND ANAEROBIC .5ML  Final   Culture NO GROWTH < 24 HOURS  Final   Report Status PENDING  Incomplete  CULTURE, BLOOD (ROUTINE X 2) w Reflex to ID Panel     Status: None (Preliminary result)   Collection Time: 09/10/16  6:04 PM  Result Value Ref Range Status   Specimen Description BLOOD RIGHT HAND  Final   Special Requests BOTTLES DRAWN AEROBIC AND ANAEROBIC .5ML  Final   Culture NO GROWTH < 24 HOURS  Final   Report Status PENDING  Incomplete     Studies: Dg Chest 2 View  Result Date: 09/09/2016 CLINICAL DATA:  [place] tonight. EXAM: CHEST  2 VIEW COMPARISON:  04/07/2016 FINDINGS: The heart is enlarged but stable. Mediastinal and hilar contours are slightly prominent but unchanged. The lungs are clear of acute process. Stable right basilar scarring changes mild chronic right-sided pleural thickening. Remote healed rib fractures are noted on the left side. No definite acute fractures. IMPRESSION: Stable cardiac enlargement and chronic lung changes but no acute pulmonary findings. Remote healed left-sided rib fractures. No definite acute fractures. Electronically Signed   By: Marijo Sanes M.D.   On: 09/09/2016 19:00   Dg Tibia/fibula Left  Result Date: 09/09/2016 CLINICAL DATA:  [place] tonight at home.  Left leg pain. EXAM: LEFT TIBIA AND FIBULA - 2 VIEW COMPARISON:  None FINDINGS: Healed/ healing proximal fibular shaft fracture with callus formation. Small fracture line persists. No acute fracture of the tibia or fibular shaft. Ankle fractures are noted. See ankle films. IMPRESSION: Ankle fractures but no acute tibial or fibular shaft fractures. Healed/healing proximal fibular shaft fracture. Electronically Signed   By: Marijo Sanes M.D.   On: 09/09/2016  19:10   Dg  Ankle Complete Left  Result Date: 09/09/2016 CLINICAL DATA:  [place] tonight at home.  Left ankle pain. EXAM: LEFT ANKLE COMPLETE - 3+ VIEW COMPARISON:  04/06/2016 FINDINGS: There are tri malleolar fractures of the ankle but these were also present on the prior film from April. No obvious acute fractures. IMPRESSION: Healing/ healed tri malleolar fractures of the ankle. No obvious acute fractures. Electronically Signed   By: Marijo Sanes M.D.   On: 09/09/2016 19:15   Ct Angio Chest Pe W Or Wo Contrast  Result Date: 09/10/2016 CLINICAL DATA:  Acute respiratory failure with hypoxia. EXAM: CT ANGIOGRAPHY CHEST WITH CONTRAST TECHNIQUE: Multidetector CT imaging of the chest was performed using the standard protocol during bolus administration of intravenous contrast. Multiplanar CT image reconstructions and MIPs were obtained to evaluate the vascular anatomy. CONTRAST:  1 ISOVUE-300 IOPAMIDOL (ISOVUE-300) INJECTION 61% COMPARISON:  04/06/2016 FINDINGS: Cardiovascular: Moderate cardiac enlargement. Calcifications involving the thoracic aorta noted. Left circumflex coronary artery calcifications. Mitral valve calcifications noted. No pericardial effusion. Pulmonary artery detail diminished secondary to respiratory motion artifact. There is no central pulmonary artery filling defect identified. No lobar or segmental pulmonary artery filling defects identified to suggest a clinically significant acute pulmonary embolus. Mediastinum/Nodes: The trachea appears patent and is midline. Normal appearance of the esophagus. No mediastinal or hilar adenopathy. Lungs/Pleura: There is no pleural effusion identified. No interstitial edema identified. Subpleural consolidation in atelectasis is identified within the right lower lobe. Upper Abdomen: There is reflux of contrast material from the right side of heart into the IVC and hepatic veins compatible with passive venous congestion. No acute findings identified within  the upper abdomen. Musculoskeletal: Spondylosis is identified within the thoracic spine. Chronic posttraumatic deformity involving the sternum noted. Review of the MIP images confirms the above findings. IMPRESSION: 1. Exam detail diminished by respiratory motion artifact. Within this limitation there is no evidence to suggest an acute pulmonary embolus. 2. Left lower lobe subpleural consolidation and atelectasis. Findings may reflect an inflammatory or infectious process. 3. Aortic atherosclerosis and coronary artery calcifications Electronically Signed   By: Kerby Moors M.D.   On: 09/10/2016 17:38   US Venous Img Lower Bilateral  Result Date: 09/11/2016 CLINICAL DATA:  Bilateral lower extremity edema. History of CHF and varicose veins. History of skin cancer. Evaluate for DVT EXAM: BILATERAL LOWER EXTREMITY VENOUS DOPPLER ULTRASOUND TECHNIQUE: Gray-scale sonography with graded compression, as well as color Doppler and duplex ultrasound were performed to evaluate the lower extremity deep venous systems from the level of the common femoral vein and including the common femoral, femoral, profunda femoral, popliteal and calf veins including the posterior tibial, peroneal and gastrocnemius veins when visible. The superficial great saphenous vein was also interrogated. Spectral Doppler was utilized to evaluate flow at rest and with distal augmentation maneuvers in the common femoral, femoral and popliteal veins. COMPARISON:  None. FINDINGS: Examination is degraded due to patient body habitus, subcutaneous edema and poor sonographic window. RIGHT LOWER EXTREMITY Common Femoral Vein: No evidence of thrombus. Normal compressibility, respiratory phasicity and response to augmentation. Saphenofemoral Junction: No evidence of thrombus. Normal compressibility and flow on color Doppler imaging. Profunda Femoral Vein: No evidence of thrombus. Normal compressibility and flow on color Doppler imaging. Femoral Vein: No  evidence of thrombus. Normal compressibility, respiratory phasicity and response to augmentation. Popliteal Vein: No evidence of thrombus. Normal compressibility, respiratory phasicity and response to augmentation. Calf Veins: No evidence of thrombus. Normal compressibility and flow on color Doppler imaging. Superficial Great Saphenous Vein: No  evidence of thrombus. Normal compressibility and flow on color Doppler imaging. Venous Reflux:  None. Other Findings:  None. LEFT LOWER EXTREMITY Common Femoral Vein: No evidence of thrombus. Normal compressibility, respiratory phasicity and response to augmentation. Saphenofemoral Junction: No evidence of thrombus. Normal compressibility and flow on color Doppler imaging. Profunda Femoral Vein: No evidence of thrombus. Normal compressibility and flow on color Doppler imaging. Femoral Vein: No evidence of thrombus. Normal compressibility, respiratory phasicity and response to augmentation. Popliteal Vein: No evidence of thrombus. Normal compressibility, respiratory phasicity and response to augmentation. Calf Veins: Suboptimally visualized. Superficial Great Saphenous Vein: No evidence of thrombus. Normal compressibility and flow on color Doppler imaging. Venous Reflux:  None. Other Findings:  None. IMPRESSION: No evidence of DVT within either lower extremity. Electronically Signed   By: Sandi Mariscal M.D.   On: 09/11/2016 08:43   Dg Chest Port 1 View  Result Date: 09/11/2016 CLINICAL DATA:  Acute respiratory failure EXAM: PORTABLE CHEST 1 VIEW COMPARISON:  September 10, 2016 FINDINGS: Stable right IJ. Stable opacity in the right base with platelike configuration. Elevation of the right hemidiaphragm is unchanged as well. Stable cardiomegaly. The hila and mediastinum are otherwise unchanged. No other interval changes. IMPRESSION: No interval changes. Stable opacity in the right lung base most consistent with atelectasis. Electronically Signed   By: Dorise Bullion III M.D    On: 09/11/2016 07:28   Dg Chest Port 1 View  Result Date: 09/10/2016 CLINICAL DATA:  Acute onset of hypoxia.  Initial encounter. EXAM: PORTABLE CHEST 1 VIEW COMPARISON:  Chest radiograph and CTA of the chest performed earlier today at 2:06 p.m. and 5:10 p.m. FINDINGS: A right IJ line is noted ending about the mid SVC. The lungs are hypoexpanded. Mild right basilar atelectasis is noted. There is no evidence of pleural effusion or pneumothorax. The cardiomediastinal silhouette is mildly enlarged. No acute osseous abnormalities are seen. IMPRESSION: 1. Right IJ line noted ending about the mid SVC. 2. Lungs hypoexpanded.  Mild right basilar atelectasis noted. 3. Mild cardiomegaly. Electronically Signed   By: Garald Balding M.D.   On: 09/10/2016 23:21   Dg Chest Port 1 View  Result Date: 09/10/2016 CLINICAL DATA:  Encounter for line placement. Atrial fibrillation, CHF, diabetes and hypertension. EXAM: PORTABLE CHEST 1 VIEW COMPARISON:  Chest x-ray from earlier same day. FINDINGS: Compared to plain film from earlier same day, the right IJ central line has been advanced with tip now at the level of the upper SVC. Cardiomegaly appears stable. Overall cardiomediastinal silhouette appears stable in size and configuration. Mild atelectasis again noted at the right lung base. Lungs otherwise clear. No pleural effusion or pneumothorax seen. Osseous structures about the chest are unremarkable. IMPRESSION: 1. Right IJ central line slightly advanced in the interval with tip now at the level of the upper SVC. Would consider additional line advancement of approximately 5 cm for more optimal radiographic positioning at the expected location of the cavoatrial junction. 2. Stable cardiomegaly. 3. Stable mild atelectasis at the right lung base. Lungs otherwise clear. No pneumothorax. Electronically Signed   By: Franki Cabot M.D.   On: 09/10/2016 14:40   Dg Chest Port 1 View  Result Date: 09/10/2016 CLINICAL DATA:  Central  line placement. EXAM: PORTABLE CHEST 1 VIEW COMPARISON:  09/10/2016 chest x-ray FINDINGS: The right IJ central venous catheter tip is in the proximal SVC. The cardiac silhouette, mediastinal and hilar contours are stable. The lungs are stable. No pneumothorax. IMPRESSION: Right IJ central venous catheter tip  is in the proximal SVC. Electronically Signed   By: Marijo Sanes M.D.   On: 09/10/2016 12:22   Dg Chest Port 1 View  Result Date: 09/10/2016 CLINICAL DATA:  Acute respiratory failure EXAM: PORTABLE CHEST 1 VIEW COMPARISON:  09/09/2016 FINDINGS: Cardiac enlargement. Negative for heart failure. Negative for edema. Small right pleural effusion or pleural scarring is similar to prior studies. Mild elevation right hemidiaphragm with mild right lower lobe atelectasis similar to prior studies. Little change from yesterday IMPRESSION: No change from yesterday. Right lower lobe atelectasis/ scarring and small right pleural effusion unchanged. Electronically Signed   By: Franchot Gallo M.D.   On: 09/10/2016 09:05   Dg Knee Complete 4 Views Left  Result Date: 09/09/2016 CLINICAL DATA:  [place] tonight at home.  Left knee pain. EXAM: LEFT KNEE - COMPLETE 4+ VIEW COMPARISON:  Radiographs 03/06/2015 FINDINGS: The femoral and tibial components or intact. No acute fractures identified. There is a long lateral compression sideplate and multiple screws transfixing an old distal femur fracture. No complicating features. There is also a remote healed proximal fibular shaft fracture. Extensive vascular calcifications are noted. No definite joint effusion. IMPRESSION: Intact hardware.  No acute fracture.  Remote healed fractures. Electronically Signed   By: Marijo Sanes M.D.   On: 09/09/2016 19:07   Dg Knee Complete 4 Views Right  Result Date: 09/09/2016 CLINICAL DATA:  [place] tonight at home. EXAM: RIGHT KNEE - COMPLETE 4+ VIEW COMPARISON:  None. FINDINGS: There is a periprosthetic fracture involving the lateral femoral  condyle with mild displacement. Could not exclude a subtle fracture of the medial femoral condyle also. There is an associated joint effusion. IMPRESSION: Periprosthetic femoral fracture. Electronically Signed   By: Marijo Sanes M.D.   On: 09/09/2016 19:06    Scheduled Meds: . allopurinol  100 mg Oral Daily  . azithromycin  500 mg Intravenous Q24H  . calcium acetate  667 mg Oral BID BM  . calcium-vitamin D  1 tablet Oral BID WC  . cefTRIAXone (ROCEPHIN)  IV  1 g Intravenous Q24H  . cholecalciferol  1,000 Units Oral Daily  . docusate sodium  100 mg Oral BID  . fluticasone  2 spray Each Nare QHS  . hydrocortisone sod succinate (SOLU-CORTEF) inj  50 mg Intravenous Q8H  . insulin aspart  0-20 Units Subcutaneous TID WC  . insulin aspart  0-5 Units Subcutaneous QHS  . mouth rinse  15 mL Mouth Rinse q12n4p  . midodrine  5 mg Oral TID WC  . pantoprazole  40 mg Oral QAC breakfast  . polyethylene glycol  17 g Oral Daily  . sevelamer carbonate  1,600 mg Oral TID WC  . sevelamer carbonate  800 mg Oral BID BM  . sodium chloride flush  10-40 mL Intracatheter Q12H  . sodium chloride flush  3 mL Intravenous Q12H  . sodium chloride flush  3 mL Intravenous Q12H   Continuous Infusions: . norepinephrine (LEVOPHED) Adult infusion 24 mcg/min (09/11/16 0818)  . phenylephrine (NEO-SYNEPHRINE) Adult infusion Stopped (09/10/16 1720)    Assessment/Plan:  1. Septic shock with multiorgan failure. From the pneumonia. On Rocephin and Zithromax. Continue pressors to maintain blood pressure. Patient made a DO NOT RESUSCITATE. Overall prognosis is poor. Patient still has high leukocytosis and high pro-calcitonin levels. 2. Acute hypoxic respiratory failure. Currently on BiPAP with 60% FiO2. Patient also has altered mental status. Family decided not to intubate at this point. 3. Acute metabolic encephalopathy secondary to sepsis. I advised the family that  they can talk with her. 4. Right hip fracture. Orthopedic  following at this point. Not a surgical candidate at this point. 5. End-stage renal disease on hemodialysis. Dialysis as per nephrology. Hyperkalemia on presentation improved. Hyponatremia still present. 6. Type 2 diabetes on sliding scale 7. History of atrial fibrillation 8. History of congestive heart failure. With all the fluids that we have to give with the septic shock, dialysis will be the only way to remove fluid. 9. History of gout on allopurinol 10. Family considering comfort care measures.  Code Status:     Code Status Orders        Start     Ordered   09/11/16 1213  Do not attempt resuscitation (DNR)  Continuous    Question Answer Comment  In the event of cardiac or respiratory ARREST Do not call a "code blue"   In the event of cardiac or respiratory ARREST Do not perform Intubation, CPR, defibrillation or ACLS   In the event of cardiac or respiratory ARREST Use medication by any route, position, wound care, and other measures to relive pain and suffering. May use oxygen, suction and manual treatment of airway obstruction as needed for comfort.      09/11/16 1212    Code Status History    Date Active Date Inactive Code Status Order ID Comments User Context   09/10/2016 12:48 AM 09/10/2016  2:44 PM Full Code [id number]  Harvie Bridge, DO Inpatient   04/06/2016  4:59 PM 04/10/2016  7:57 PM Full Code [id number]  Epifanio Lesches, MD ED   11/20/2015  3:03 PM 11/20/2015  6:10 PM Full Code [id number]  Algernon Huxley, MD Inpatient   08/14/2015  5:40 AM 08/21/2015 10:45 PM Full Code [id number]  Juluis Mire, MD Inpatient   08/02/2015  1:44 PM 08/11/2015 10:21 PM Full Code [id number]  Gladstone Lighter, MD Inpatient     Family Communication: Case discussed with critical care specialist and family at the bedside Disposition Plan: To be determined  Consultants:  Critical care specialist  Nephrology  Orthopedic surgery  Antibiotics:  Rocephin  Zithromax  Time spent: 35  minutes, critical care time. Patient critically ill and high risk for cardiopulmonary arrest  [place], [facility]

## 2016-09-11 NOTE — Progress Notes (Signed)
Patient ID: [name], female   DOB: 1934/10/02, 82 y.o.   MRN: [id number] ACP Note  Family present at the bedside. Critical care specialist present.  Diagnosis. Septic shock with multiorgan failure, acute respiratory failure, hypotension requiring pressors, end-stage renal disease, CHF.  Patient made a DO NOT RESUSCITATE. Family considering comfort care measures.  Time spent with this conversation 17 minutes

## 2016-09-11 NOTE — Progress Notes (Signed)
Spoke with Dr Rockne Menghini regarding patients heart rate mid 120's. Precedex if off. Per Dr Rockne Menghini keep it off and to call him if heart rate gets into the 140's.

## 2016-09-12 DIAGNOSIS — I959 Hypotension, unspecified: Secondary | ICD-10-CM

## 2016-09-12 DIAGNOSIS — J9601 Acute respiratory failure with hypoxia: Secondary | ICD-10-CM

## 2016-09-12 LAB — CBC WITH DIFFERENTIAL/PLATELET
Band Neutrophils: 2 %
Basophils Absolute: 0 10*3/uL (ref 0–0.1)
Basophils Relative: 0 %
Blasts: 0 %
Eosinophils Absolute: 0 10*3/uL (ref 0–0.7)
Eosinophils Relative: 0 %
HCT: 31.8 % — ABNORMAL LOW (ref 35.0–47.0)
Hemoglobin: 10.5 g/dL — ABNORMAL LOW (ref 12.0–16.0)
Lymphocytes Relative: 10 %
Lymphs Abs: 1.7 10*3/uL (ref 1.0–3.6)
MCH: 33.2 pg (ref 26.0–34.0)
MCHC: 33 g/dL (ref 32.0–36.0)
MCV: 100.8 fL — ABNORMAL HIGH (ref 80.0–100.0)
Metamyelocytes Relative: 1 %
Monocytes Absolute: 0.9 10*3/uL (ref 0.2–0.9)
Monocytes Relative: 5 %
Myelocytes: 1 %
Neutro Abs: 14.5 10*3/uL — ABNORMAL HIGH (ref 1.4–6.5)
Neutrophils Relative %: 81 %
Other: 0 %
Platelets: 132 10*3/uL — ABNORMAL LOW (ref 150–440)
Promyelocytes Absolute: 0 %
RBC: 3.16 MIL/uL — ABNORMAL LOW (ref 3.80–5.20)
RDW: 16.1 % — ABNORMAL HIGH (ref 11.5–14.5)
WBC: 17.1 10*3/uL — ABNORMAL HIGH (ref 3.6–11.0)
nRBC: 0 /100{WBCs}

## 2016-09-12 LAB — BASIC METABOLIC PANEL WITH GFR
Anion gap: 13 (ref 5–15)
BUN: 46 mg/dL — ABNORMAL HIGH (ref 6–20)
CO2: 22 mmol/L (ref 22–32)
Calcium: 8.6 mg/dL — ABNORMAL LOW (ref 8.9–10.3)
Chloride: 92 mmol/L — ABNORMAL LOW (ref 101–111)
Creatinine, Ser: 4.39 mg/dL — ABNORMAL HIGH (ref 0.44–1.00)
GFR calc Af Amer: 10 mL/min — ABNORMAL LOW
GFR calc non Af Amer: 9 mL/min — ABNORMAL LOW
Glucose, Bld: 190 mg/dL — ABNORMAL HIGH (ref 65–99)
Potassium: 5.3 mmol/L — ABNORMAL HIGH (ref 3.5–5.1)
Sodium: 127 mmol/L — ABNORMAL LOW (ref 135–145)

## 2016-09-12 LAB — GLUCOSE, CAPILLARY
GLUCOSE-CAPILLARY: 189 mg/dL — AB (ref 65–99)
Glucose-Capillary: 203 mg/dL — ABNORMAL HIGH (ref 65–99)
Glucose-Capillary: 136 mg/dL — ABNORMAL HIGH (ref 65–99)
Glucose-Capillary: 115 mg/dL — ABNORMAL HIGH (ref 65–99)

## 2016-09-12 LAB — PHOSPHORUS: Phosphorus: 6.4 mg/dL — ABNORMAL HIGH (ref 2.5–4.6)

## 2016-09-12 LAB — HEPARIN LEVEL (UNFRACTIONATED): Heparin Unfractionated: 0.13 [IU]/mL — ABNORMAL LOW (ref 0.30–0.70)

## 2016-09-12 LAB — PROCALCITONIN: Procalcitonin: 7.26 ng/mL

## 2016-09-12 MED ORDER — VANCOMYCIN HCL 10 G IV SOLR
2000.0000 mg | Freq: Once | INTRAVENOUS | Status: AC
  Administered 2016-09-12: 2000 mg via INTRAVENOUS
  Filled 2016-09-12: qty 2000

## 2016-09-12 MED ORDER — HEPARIN SODIUM (PORCINE) 5000 UNIT/ML IJ SOLN
5000.0000 [IU] | Freq: Three times a day (TID) | INTRAMUSCULAR | Status: DC
  Administered 2016-09-12 – 2016-09-17 (×12): 5000 [IU] via SUBCUTANEOUS
  Filled 2016-09-12 (×11): qty 1

## 2016-09-12 MED ORDER — DEXTROSE 5 % IV SOLN
1.0000 g | INTRAVENOUS | Status: DC
  Administered 2016-09-12 – 2016-09-15 (×4): 1 g via INTRAVENOUS
  Filled 2016-09-12 (×6): qty 1

## 2016-09-12 MED ORDER — VANCOMYCIN HCL IN DEXTROSE 1-5 GM/200ML-% IV SOLN
1000.0000 mg | INTRAVENOUS | Status: DC
  Administered 2016-09-14: 1000 mg via INTRAVENOUS
  Filled 2016-09-12 (×6): qty 200

## 2016-09-12 NOTE — Progress Notes (Signed)
Hd completed 

## 2016-09-12 NOTE — Progress Notes (Signed)
Patient ID: Yanelie Polin, female   DOB: June 18, 1934, 82 y.o.   MRN: [id number]   [facility] PROGRESS NOTE  Mikila Maines [id number] DOB: 1934-10-07 DOA: 09/09/2016 PCP: Rusty Aus, MD  HPI/Subjective: Patient states she feels better, breathing okay, no pain  Objective: Vitals:   09/12/16 0900 09/12/16 1000  BP: 112/62 (!) 102/57  Pulse: 92 91  Resp: 17 15  Temp:      Filed Weights   09/09/16 1808 09/10/16 0124 09/10/16 1000  Weight: 95.3 kg (210 lb) 97.6 kg (215 lb 2.7 oz) 99 kg (218 lb 4.1 oz)    ROS: Review of Systems  Unable to perform ROS: Critical illness   Exam: Physical Exam  HENT:  Nose: No mucosal edema.  Unable to look in the mail today.  Eyes: Lids are normal. Pupils are equal, round, and reactive to light.  Neck: No thyroid mass and no thyromegaly present.  Cardiovascular: An irregularly irregular rhythm present.  Murmur heard.  Systolic murmur is present with a grade of 2/6  Respiratory: She has decreased breath sounds in the right lower field and the left lower field. She has no wheezes. She has rhonchi in the right lower field and the left lower field.  GI: Soft. Bowel sounds are normal. There is no tenderness.  Musculoskeletal:       Right ankle: She exhibits swelling.       Left ankle: She exhibits swelling.  Neurological: She is alert.  Able to wiggle toes on bilateral legs  Skin: Skin is warm.  Bruising bilateral lower extremities  Psychiatric: She has a normal mood and affect.      Data Reviewed: Basic Metabolic Panel:  Recent Labs Lab 09/09/16 1754 09/10/16 0029 09/10/16 [id number] 09/10/16 1005 09/10/16 1350 09/10/16 1741 09/11/16 0212 09/12/16 0504  NA 125*  --  125*  --   --   --  129* 127*  K 5.6*  --  6.4*  --  2.6* 3.6 4.1 5.3*  CL 95*  --  97*  --   --   --  93* 92*  CO2 23  --  14*  --   --   --  23 22  GLUCOSE 153*  --  214*  --   --   --  98 190*  BUN 41*  --  45*  --   --   --  26* 46*  CREATININE 4.11*  --   4.31*  --   --   --  3.21* 4.39*  CALCIUM 8.8*  --  8.8*  --   --   --  8.9 8.6*  MG  --  1.8  --   --   --   --  1.8  --   PHOS  --  4.1  --  5.7*  --   --  4.1  --    Liver Function Tests:  Recent Labs Lab 09/09/16 1754 09/10/16 0623  AST 40 37  ALT 13* 14  ALKPHOS 260* 245*  BILITOT 1.9* 2.2*  PROT 7.1 7.2  ALBUMIN 3.2* 3.2*   No results for input(s): LIPASE, AMYLASE in the last 168 hours. No results for input(s): AMMONIA in the last 168 hours. CBC:  Recent Labs Lab 09/09/16 1754 09/10/16 0623 09/11/16 0212 09/12/16 0504  WBC 15.2* 16.2* 21.8* 17.1*  NEUTROABS  --   --  17.5* 14.5*  HGB 11.8* 12.2 11.1* 10.5*  HCT 35.2 38.3 33.8* 31.8*  MCV 101.3* 104.9* 100.2*  100.8*  PLT 159 148* 148* 132*   Cardiac Enzymes:  Recent Labs Lab 09/09/16 1754 09/10/16 0029 09/10/16 0623 09/10/16 1145  TROPONINI 0.03* 0.03* 0.07* 0.20*    CBG:  Recent Labs Lab 09/11/16 0720 09/11/16 1142 09/11/16 1555 09/11/16 2137 09/12/16 0819  GLUCAP 104* 115* 126* 167* 203*    Recent Results (from the past 240 hour(s))  MRSA PCR Screening     Status: Abnormal   Collection Time: 09/10/16  1:47 AM  Result Value Ref Range Status   MRSA by PCR POSITIVE (A) NEGATIVE Final    Comment:        The GeneXpert MRSA Assay (FDA approved for NASAL specimens only), is one component of a comprehensive MRSA colonization surveillance program. It is not intended to diagnose MRSA infection nor to guide or monitor treatment for MRSA infections. RESULT CALLED TO, READ BACK BY AND VERIFIED WITH: MONIQUE ROBERSON ON 09/10/16 AT 0359 BY TLB   CULTURE, BLOOD (ROUTINE X 2) w Reflex to ID Panel     Status: None (Preliminary result)   Collection Time: 09/10/16  8:19 AM  Result Value Ref Range Status   Specimen Description BLOOD RIGHT HAND  Final   Special Requests BOTTLES DRAWN AEROBIC AND ANAEROBIC .5ML  Final   Culture NO GROWTH 2 DAYS  Final   Report Status PENDING  Incomplete  CULTURE,  BLOOD (ROUTINE X 2) w Reflex to ID Panel     Status: None (Preliminary result)   Collection Time: 09/10/16  6:04 PM  Result Value Ref Range Status   Specimen Description BLOOD RIGHT HAND  Final   Special Requests BOTTLES DRAWN AEROBIC AND ANAEROBIC .5ML  Final   Culture NO GROWTH 2 DAYS  Final   Report Status PENDING  Incomplete     Studies: Ct Angio Chest Pe W Or Wo Contrast  Result Date: 09/10/2016 CLINICAL DATA:  Acute respiratory failure with hypoxia. EXAM: CT ANGIOGRAPHY CHEST WITH CONTRAST TECHNIQUE: Multidetector CT imaging of the chest was performed using the standard protocol during bolus administration of intravenous contrast. Multiplanar CT image reconstructions and MIPs were obtained to evaluate the vascular anatomy. CONTRAST:  1 ISOVUE-300 IOPAMIDOL (ISOVUE-300) INJECTION 61% COMPARISON:  04/06/2016 FINDINGS: Cardiovascular: Moderate cardiac enlargement. Calcifications involving the thoracic aorta noted. Left circumflex coronary artery calcifications. Mitral valve calcifications noted. No pericardial effusion. Pulmonary artery detail diminished secondary to respiratory motion artifact. There is no central pulmonary artery filling defect identified. No lobar or segmental pulmonary artery filling defects identified to suggest a clinically significant acute pulmonary embolus. Mediastinum/Nodes: The trachea appears patent and is midline. Normal appearance of the esophagus. No mediastinal or hilar adenopathy. Lungs/Pleura: There is no pleural effusion identified. No interstitial edema identified. Subpleural consolidation in atelectasis is identified within the right lower lobe. Upper Abdomen: There is reflux of contrast material from the right side of heart into the IVC and hepatic veins compatible with passive venous congestion. No acute findings identified within the upper abdomen. Musculoskeletal: Spondylosis is identified within the thoracic spine. Chronic posttraumatic deformity involving  the sternum noted. Review of the MIP images confirms the above findings. IMPRESSION: 1. Exam detail diminished by respiratory motion artifact. Within this limitation there is no evidence to suggest an acute pulmonary embolus. 2. Left lower lobe subpleural consolidation and atelectasis. Findings may reflect an inflammatory or infectious process. 3. Aortic atherosclerosis and coronary artery calcifications Electronically Signed   By: Kerby Moors M.D.   On: 09/10/2016 17:38   US Venous Img Lower Bilateral  Result Date: 09/11/2016 CLINICAL DATA:  Bilateral lower extremity edema. History of CHF and varicose veins. History of skin cancer. Evaluate for DVT EXAM: BILATERAL LOWER EXTREMITY VENOUS DOPPLER ULTRASOUND TECHNIQUE: Gray-scale sonography with graded compression, as well as color Doppler and duplex ultrasound were performed to evaluate the lower extremity deep venous systems from the level of the common femoral vein and including the common femoral, femoral, profunda femoral, popliteal and calf veins including the posterior tibial, peroneal and gastrocnemius veins when visible. The superficial great saphenous vein was also interrogated. Spectral Doppler was utilized to evaluate flow at rest and with distal augmentation maneuvers in the common femoral, femoral and popliteal veins. COMPARISON:  None. FINDINGS: Examination is degraded due to patient body habitus, subcutaneous edema and poor sonographic window. RIGHT LOWER EXTREMITY Common Femoral Vein: No evidence of thrombus. Normal compressibility, respiratory phasicity and response to augmentation. Saphenofemoral Junction: No evidence of thrombus. Normal compressibility and flow on color Doppler imaging. Profunda Femoral Vein: No evidence of thrombus. Normal compressibility and flow on color Doppler imaging. Femoral Vein: No evidence of thrombus. Normal compressibility, respiratory phasicity and response to augmentation. Popliteal Vein: No evidence of  thrombus. Normal compressibility, respiratory phasicity and response to augmentation. Calf Veins: No evidence of thrombus. Normal compressibility and flow on color Doppler imaging. Superficial Great Saphenous Vein: No evidence of thrombus. Normal compressibility and flow on color Doppler imaging. Venous Reflux:  None. Other Findings:  None. LEFT LOWER EXTREMITY Common Femoral Vein: No evidence of thrombus. Normal compressibility, respiratory phasicity and response to augmentation. Saphenofemoral Junction: No evidence of thrombus. Normal compressibility and flow on color Doppler imaging. Profunda Femoral Vein: No evidence of thrombus. Normal compressibility and flow on color Doppler imaging. Femoral Vein: No evidence of thrombus. Normal compressibility, respiratory phasicity and response to augmentation. Popliteal Vein: No evidence of thrombus. Normal compressibility, respiratory phasicity and response to augmentation. Calf Veins: Suboptimally visualized. Superficial Great Saphenous Vein: No evidence of thrombus. Normal compressibility and flow on color Doppler imaging. Venous Reflux:  None. Other Findings:  None. IMPRESSION: No evidence of DVT within either lower extremity. Electronically Signed   By: Sandi Mariscal M.D.   On: 09/11/2016 08:43   Dg Chest Port 1 View  Result Date: 09/11/2016 CLINICAL DATA:  Acute respiratory failure EXAM: PORTABLE CHEST 1 VIEW COMPARISON:  September 10, 2016 FINDINGS: Stable right IJ. Stable opacity in the right base with platelike configuration. Elevation of the right hemidiaphragm is unchanged as well. Stable cardiomegaly. The hila and mediastinum are otherwise unchanged. No other interval changes. IMPRESSION: No interval changes. Stable opacity in the right lung base most consistent with atelectasis. Electronically Signed   By: Dorise Bullion III M.D   On: 09/11/2016 07:28   Dg Chest Port 1 View  Result Date: 09/10/2016 CLINICAL DATA:  Acute onset of hypoxia.  Initial  encounter. EXAM: PORTABLE CHEST 1 VIEW COMPARISON:  Chest radiograph and CTA of the chest performed earlier today at 2:06 p.m. and 5:10 p.m. FINDINGS: A right IJ line is noted ending about the mid SVC. The lungs are hypoexpanded. Mild right basilar atelectasis is noted. There is no evidence of pleural effusion or pneumothorax. The cardiomediastinal silhouette is mildly enlarged. No acute osseous abnormalities are seen. IMPRESSION: 1. Right IJ line noted ending about the mid SVC. 2. Lungs hypoexpanded.  Mild right basilar atelectasis noted. 3. Mild cardiomegaly. Electronically Signed   By: Garald Balding M.D.   On: 09/10/2016 23:21   Dg Chest Port 1 View  Result Date: 09/10/2016  CLINICAL DATA:  Encounter for line placement. Atrial fibrillation, CHF, diabetes and hypertension. EXAM: PORTABLE CHEST 1 VIEW COMPARISON:  Chest x-ray from earlier same day. FINDINGS: Compared to plain film from earlier same day, the right IJ central line has been advanced with tip now at the level of the upper SVC. Cardiomegaly appears stable. Overall cardiomediastinal silhouette appears stable in size and configuration. Mild atelectasis again noted at the right lung base. Lungs otherwise clear. No pleural effusion or pneumothorax seen. Osseous structures about the chest are unremarkable. IMPRESSION: 1. Right IJ central line slightly advanced in the interval with tip now at the level of the upper SVC. Would consider additional line advancement of approximately 5 cm for more optimal radiographic positioning at the expected location of the cavoatrial junction. 2. Stable cardiomegaly. 3. Stable mild atelectasis at the right lung base. Lungs otherwise clear. No pneumothorax. Electronically Signed   By: Franki Cabot M.D.   On: 09/10/2016 14:40   Dg Chest Port 1 View  Result Date: 09/10/2016 CLINICAL DATA:  Central line placement. EXAM: PORTABLE CHEST 1 VIEW COMPARISON:  09/10/2016 chest x-ray FINDINGS: The right IJ central venous  catheter tip is in the proximal SVC. The cardiac silhouette, mediastinal and hilar contours are stable. The lungs are stable. No pneumothorax. IMPRESSION: Right IJ central venous catheter tip is in the proximal SVC. Electronically Signed   By: Marijo Sanes M.D.   On: 09/10/2016 12:22    Scheduled Meds: . allopurinol  100 mg Oral Daily  . azithromycin  500 mg Intravenous Q24H  . ceFEPime (MAXIPIME) IV  1 g Intravenous Q24H  . cholecalciferol  1,000 Units Oral Daily  . docusate sodium  100 mg Oral BID  . fluticasone  2 spray Each Nare QHS  . heparin subcutaneous  5,000 Units Subcutaneous Q8H  . hydrocortisone sod succinate (SOLU-CORTEF) inj  50 mg Intravenous Q8H  . insulin aspart  0-20 Units Subcutaneous TID WC  . insulin aspart  0-5 Units Subcutaneous QHS  . mouth rinse  15 mL Mouth Rinse q12n4p  . midodrine  5 mg Oral TID WC  . pantoprazole  40 mg Oral QAC breakfast  . polyethylene glycol  17 g Oral Daily  . sevelamer carbonate  1,600 mg Oral TID WC  . sevelamer carbonate  800 mg Oral BID BM  . sodium chloride flush  10-40 mL Intracatheter Q12H  . sodium chloride flush  3 mL Intravenous Q12H  . sodium chloride flush  3 mL Intravenous Q12H  . vancomycin  2,000 mg Intravenous Once  . [START ON 09/13/2016] vancomycin  1,000 mg Intravenous Q M,W,F-HD   Continuous Infusions: . dexmedetomidine Stopped (09/12/16 1018)  . norepinephrine (LEVOPHED) Adult infusion 8.96 mcg/min (09/12/16 0800)  . phenylephrine (NEO-SYNEPHRINE) Adult infusion Stopped (09/10/16 1720)    Assessment/Plan:  1. Septic shock with multiorgan failure. From the pneumonia. On vancomycin, cefepime and Zithromax. Continue Levophed to maintain blood pressure. Patient made a DO NOT RESUSCITATE.  Patient still has high leukocytosis and high pro-calcitonin levels. 2. Acute hypoxic respiratory failure. Off bipap and on nasal canula. I dialed her down to 4 liters since saturating 100%. On stress dose steroids. 3. Acute  metabolic encephalopathy secondary to sepsis. Patient able to talk with me today. 4. Right hip fracture. Orthopedic following at this point. Not a surgical candidate at this point. 5. End-stage renal disease on hemodialysis. Dialysis today as per nephrology. Hyperkalemia today. Hyponatremia still present. 6. Type 2 diabetes on sliding scale 7. History of  atrial fibrillation 8. History of congestive heart failure. Dialysis today will only be to filter blood 9. History of gout on allopurinol  Code Status:     Code Status Orders        Start     Ordered   09/11/16 1213  Do not attempt resuscitation (DNR)  Continuous    Question Answer Comment  In the event of cardiac or respiratory ARREST Do not call a "code blue"   In the event of cardiac or respiratory ARREST Do not perform Intubation, CPR, defibrillation or ACLS   In the event of cardiac or respiratory ARREST Use medication by any route, position, wound care, and other measures to relive pain and suffering. May use oxygen, suction and manual treatment of airway obstruction as needed for comfort.      09/11/16 1212    Code Status History    Date Active Date Inactive Code Status Order ID Comments User Context   09/10/2016 12:48 AM 09/10/2016  2:44 PM Full Code [id number]  Harvie Bridge, DO Inpatient   04/06/2016  4:59 PM 04/10/2016  7:57 PM Full Code [id number]  Epifanio Lesches, MD ED   11/20/2015  3:03 PM 11/20/2015  6:10 PM Full Code [id number]  Algernon Huxley, MD Inpatient   08/14/2015  5:40 AM 08/21/2015 10:45 PM Full Code [id number]  Juluis Mire, MD Inpatient   08/02/2015  1:44 PM 08/11/2015 10:21 PM Full Code [id number]  Gladstone Lighter, MD Inpatient     Family Communication: Case discussed with critical care specialist and Daughter from [facility] at the bedside Disposition Plan: To be determined  Consultants:  Critical care specialist  Nephrology  Orthopedic surgery  Antibiotics:  Rocephin  Zithromax  Time spent:  30 minutes critical care time. Patient critically ill and high risk for cardiopulmonary arrest  [place], [facility]

## 2016-09-12 NOTE — Progress Notes (Signed)
PULMONARY / CRITICAL CARE MEDICINE   Name: [name] MRN: [id number] DOB: 1934/09/12    ADMISSION DATE:  09/09/2016    SUBJECTIVE:  Pt unresponsive today. RN notes that she was grabbing at leads and bipap mask earlier and was put on precedex, however she was not alert at those times.   Family update:  Discussed pt's condition with husband today; they are waiting for further family to come in to make decisions. Husband would like to make pt comfort measures only, but patient has 5 children that are not his, and they are not all in agreement.   REVIEW OF SYSTEMS:  Could not obtain due to critical illness.   VITAL SIGNS: BP (!) 94/59   Pulse (!) 101   Temp 98.7 F (37.1 C) (Axillary)   Resp (!) 26   Ht 5\' 1"  (1.549 m)   Wt 218 lb 4.1 oz (99 kg)   SpO2 100%   BMI 41.24 kg/m   HEMODYNAMICS:    VENTILATOR SETTINGS: FiO2 (%):  [50 %] 50 %  INTAKE / OUTPUT: I/O last 3 completed shifts: In: 338.9 [I.V.:338.9] Out: -   PHYSICAL EXAMINATION: General:  Acutely ill appearing Caucasian female Neuro:  Lethargic unresponsive,  PERRL HEENT:  JVD present, normocephalic  Cardiovascular: atrial fibrillation, no M/R/G Lungs:  Course throughout, even, labored Abdomen:  +BS x4, obese, soft non tender Musculoskeletal: right lower extremity immobilizer, 2+ bilateral lower extremity edema Skin: intact, no rashes or lesions  LABS:  BMET  Recent Labs Lab 09/10/16 0623  09/10/16 1741 09/11/16 0212 09/12/16 0504  NA 125*  --   --  129* 127*  K 6.4*  < > 3.6 4.1 5.3*  CL 97*  --   --  93* 92*  CO2 14*  --   --  23 22  BUN 45*  --   --  26* 46*  CREATININE 4.31*  --   --  3.21* 4.39*  GLUCOSE 214*  --   --  98 190*  < > = values in this interval not displayed.  Electrolytes  Recent Labs Lab 09/10/16 0029 09/10/16 0623 09/10/16 1005 09/11/16 0212 09/12/16 0504  CALCIUM  --  8.8*  --  8.9 8.6*  MG 1.8  --   --  1.8  --   PHOS 4.1  --  5.7* 4.1  --      CBC  Recent Labs Lab 09/10/16 0623 09/11/16 0212 09/12/16 0504  WBC 16.2* 21.8* 17.1*  HGB 12.2 11.1* 10.5*  HCT 38.3 33.8* 31.8*  PLT 148* 148* 132*    Coag's  Recent Labs Lab 09/10/16 1145  APTT 44*  INR 1.24    Sepsis Markers  Recent Labs Lab 09/10/16 0839 09/10/16 1145 09/11/16 0658 09/12/16 0504  LATICACIDVEN 5.3* 4.1*  --   --   PROCALCITON <0.10  --  5.47 7.26    ABG  Recent Labs Lab 09/10/16 1200 09/10/16 2253  PHART 7.40 7.43  PCO2ART 41 35  PO2ART 53* 230*    Liver Enzymes  Recent Labs Lab 09/09/16 1754 09/10/16 0623  AST 40 37  ALT 13* 14  ALKPHOS 260* 245*  BILITOT 1.9* 2.2*  ALBUMIN 3.2* 3.2*    Cardiac Enzymes  Recent Labs Lab 09/10/16 0029 09/10/16 0623 09/10/16 1145  TROPONINI 0.03* 0.07* 0.20*    Glucose  Recent Labs Lab 09/10/16 2117 09/11/16 0720 09/11/16 1142 09/11/16 1555 09/11/16 2137 09/12/16 0819  GLUCAP 85 104* 115* 126* 167* 203*  Imaging No results found.   STUDIES:  Right Knee xray 9/28>>Periprosthetic femoral fracture Left Knee xray 9/28>>No acute fracture, remote healed fractures Left tibia/fibula xray 9/28>>Ankle fractures but no acute tibial or fibular shaft fractures.  Healed/healing proximal fibular shaft fracture CT of chest 9/29>>  CULTURES: Blood x2 9/29>> Sputum>> MRSA PCR 9/29>>positive  ANTIBIOTICS: None  SIGNIFICANT EVENTS: 9/29-Pt admitted to [facility] with c/o fatigue post fall 9/29-Pt transferred to ICU due to hypotension requiring pressors and acute on chronic hypoxic respiratory failure secondary to CHF exacerbation and ?PE.  PCCM consulted for further management  LINES/TUBES: PIV's x2 9/29>> RIJ 9/29>>  ASSESSMENT / PLAN:  82 yo female admitted with R knee fracture of existing prosthesis, with existing ESRD on HD, now with right pneumonia with septic shock, acute systolic CHF.  Now DNR, family considering change to comfort measures only.    PULMONARY A: Acute on chronic hypoxic respiratory failure secondary to Pneumonia. Morbid Obesity with OSA (requires CPAP qhs) CT chest images 09/10/16: RLL pneumonia, severe RV dilatation, c/w severe pulmonary hypertension; severely reduced lung volumes due to cardiomegaly and obesity.  P:   Bipap dependent, will continue.  Increase abx coverage.  Supplemental O2 to maintain O2 sats 92% or above --Poor prognosis given multiple comorbidities.   CARDIOVASCULAR A:  Atrial Fibrillation Hypotension secondary septic shock Hx: Dysrhythmia, CHF, and hypertension P:   Continue outpatient midodrine Maintain systolic blood pressure 90 or above Levophed drip to maintain systolic blood pressure goal [place] bilateral lower extremities negative for DVT 9/29  Echo 09/11/16; EF 45% (55% on Echo 08/05/15) Pulmonary hypertension with RV dilation.  Empiric steroids.   RENAL A:   ESRD (hemodialysis Monday, Wednesday, and Friday) P:   Discussed with nephrology today, will await family decisions.  Hemodialysis per Nephrology Replace electrolytes as indicated Hyponatremia.   GASTROINTESTINAL A:   No acute issues P:   Keep NPO for now  Continue protonix if able to tolerate po medications  HEMATOLOGIC A:   Leukocytosis, likely reactive.  P:  Trend CBC Monitor for s/sx of bleeding Transfuse for hgb <7  INFECTIOUS A:   Leukocytosis-MRSA PCR positive no infiltrates present on CXR 9/29  P:   Trend PCT if PCT elevated will start abx Trend WBC and monitor fever curve Follow cultures  ENDOCRINE A:   Hx: Diabetes mellitus P:   Continue SSI  NEUROLOGIC A:   No acute issues P:   Avoid sedating medications Reorient pt Lights on during the day Tramadol as needed for pain  MUSCULOSKELETAL  A:  Right femoral fracture and Left ankle fracture Hx: Left tibia and fibula shaft fractures P: Orthopedic consulted appreciate input Strict bedrest Fall precautions Prn Tramadol for  pain   -Marda Stalker, M.D.  09/12/2016   Critical Care Attestation.  I have personally obtained a history, examined the patient, evaluated laboratory and imaging results, formulated the assessment and plan and placed orders. The Patient requires high complexity decision making for assessment and support, frequent evaluation and titration of therapies, application of advanced monitoring technologies and extensive interpretation of multiple databases. The patient has critical illness that could lead imminently to failure of 1 or more organ systems and requires the highest level of physician preparedness to intervene.  Critical Care Time devoted to patient care services described in this note is 60 minutes and is exclusive of time spent in procedures.

## 2016-09-12 NOTE — Progress Notes (Signed)
HD STARTED  

## 2016-09-12 NOTE — Progress Notes (Signed)
PT Cancellation Note  Patient Details Name: [name] MRN: [id number] DOB: 1934-02-05   Cancelled Treatment:    Reason Eval/Treat Not Completed: Patient not medically ready;Other (comment) (Chart reviewed, RN consulted. RN reports patient is no longer appropriate for PT at this time and she will DC the order. As of last notes in medical record, the patient's health is declining and comfort measures are being discussed. )   9:53 AM, 09/12/16 Etta Grandchild, PT, DPT Physical Therapist - [place] [phone] (mobile)

## 2016-09-12 NOTE — Progress Notes (Signed)
Pre dialysis assessment 
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
## 2016-09-12 NOTE — Progress Notes (Signed)
Post dialysis assessment 

## 2016-09-13 LAB — CBC
HCT: 30.4 % — ABNORMAL LOW (ref 35.0–47.0)
Hemoglobin: 10.3 g/dL — ABNORMAL LOW (ref 12.0–16.0)
MCH: 33.9 pg (ref 26.0–34.0)
MCHC: 34 g/dL (ref 32.0–36.0)
MCV: 99.8 fL (ref 80.0–100.0)
PLATELETS: 112 10*3/uL — AB (ref 150–440)
RBC: 3.05 MIL/uL — ABNORMAL LOW (ref 3.80–5.20)
RDW: 16 % — AB (ref 11.5–14.5)
WBC: 13.5 10*3/uL — ABNORMAL HIGH (ref 3.6–11.0)

## 2016-09-13 LAB — BASIC METABOLIC PANEL
ANION GAP: 10 (ref 5–15)
BUN: 29 mg/dL — AB (ref 6–20)
CALCIUM: 8.4 mg/dL — AB (ref 8.9–10.3)
CO2: 29 mmol/L (ref 22–32)
CREATININE: 2.82 mg/dL — AB (ref 0.44–1.00)
Chloride: 95 mmol/L — ABNORMAL LOW (ref 101–111)
GFR calc Af Amer: 17 mL/min — ABNORMAL LOW (ref >60)
GFR, EST NON AFRICAN AMERICAN: 15 mL/min — AB (ref >60)
GLUCOSE: 161 mg/dL — AB (ref 65–99)
Potassium: 3.7 mmol/L (ref 3.5–5.1)
Sodium: 134 mmol/L — ABNORMAL LOW (ref 135–145)

## 2016-09-13 LAB — GLUCOSE, CAPILLARY
Glucose-Capillary: 164 mg/dL — ABNORMAL HIGH (ref 65–99)
Glucose-Capillary: 181 mg/dL — ABNORMAL HIGH (ref 65–99)
Glucose-Capillary: 164 mg/dL — ABNORMAL HIGH (ref 65–99)
Glucose-Capillary: 187 mg/dL — ABNORMAL HIGH (ref 65–99)
Glucose-Capillary: 157 mg/dL — ABNORMAL HIGH (ref 65–99)

## 2016-09-13 LAB — PHOSPHORUS: PHOSPHORUS: 4.2 mg/dL (ref 2.5–4.6)

## 2016-09-13 LAB — MAGNESIUM: MAGNESIUM: 1.8 mg/dL (ref 1.7–2.4)

## 2016-09-13 MED ORDER — CHLORHEXIDINE GLUCONATE CLOTH 2 % EX PADS
6.0000 | MEDICATED_PAD | Freq: Every day | CUTANEOUS | Status: AC
  Administered 2016-09-13 – 2016-09-17 (×5): 6 via TOPICAL

## 2016-09-13 MED ORDER — HYDROCORTISONE NA SUCCINATE PF 100 MG IJ SOLR
50.0000 mg | Freq: Two times a day (BID) | INTRAMUSCULAR | Status: DC
  Administered 2016-09-13 – 2016-09-15 (×4): 50 mg via INTRAVENOUS
  Filled 2016-09-13: qty 1
  Filled 2016-09-13 (×3): qty 2

## 2016-09-13 MED ORDER — VANCOMYCIN HCL IN DEXTROSE 1-5 GM/200ML-% IV SOLN
1000.0000 mg | Freq: Once | INTRAVENOUS | Status: AC
  Administered 2016-09-13: 1000 mg via INTRAVENOUS
  Filled 2016-09-13: qty 200

## 2016-09-13 MED ORDER — MUPIROCIN 2 % EX OINT
1.0000 "application " | TOPICAL_OINTMENT | Freq: Two times a day (BID) | CUTANEOUS | Status: AC
  Administered 2016-09-13 – 2016-09-17 (×6): 1 via NASAL
  Filled 2016-09-13: qty 22

## 2016-09-13 NOTE — Progress Notes (Signed)
PULMONARY / CRITICAL CARE MEDICINE   Name: [name] MRN: [id number] DOB: 1934-04-20    ADMISSION DATE:  09/09/2016    SUBJECTIVE:  Pt unresponsive today. RN notes that she was grabbing at leads and bipap mask earlier and was put on precedex, however she was not alert at those times.  Off vasopressors this AM   Family update: 10/1 by Dr Ashby Dawes Discussed pt's condition with husband today; they are waiting for further family to come in to make decisions. Husband would like to make pt comfort measures only, but patient has 5 children that are not his, and they are not all in agreement.   REVIEW OF SYSTEMS:  Could not obtain due to critical illness.   VITAL SIGNS: BP (!) 103/55   Pulse (!) 109   Temp 97.9 F (36.6 C) (Axillary)   Resp (!) 25   Ht 5\' 1"  (1.549 m)   Wt 222 lb 0.1 oz (100.7 kg)   SpO2 100%   BMI 41.95 kg/m      VENTILATOR SETTINGS: FiO2 (%):  [50 %] 50 %  INTAKE / OUTPUT: I/O last 3 completed shifts: In: 2097.9 [P.O.:315; I.V.:732.9; IV Piggyback:1050] Out: 0   PHYSICAL EXAMINATION: General:  Acutely ill appearing Caucasian female Neuro:  Lethargic unresponsive,  PERRL HEENT:  JVD present, normocephalic  Cardiovascular: atrial fibrillation, no M/R/G Lungs:  Course throughout, even, labored Abdomen:  +BS x4, obese, soft non tender Musculoskeletal: right lower extremity immobilizer, 2+ bilateral lower extremity edema Skin: intact, no rashes or lesions  LABS:  BMET  Recent Labs Lab 09/11/16 0212 09/12/16 0504 09/13/16 0415  NA 129* 127* 134*  K 4.1 5.3* 3.7  CL 93* 92* 95*  CO2 23 22 29   BUN 26* 46* 29*  CREATININE 3.21* 4.39* 2.82*  GLUCOSE 98 190* 161*    Electrolytes  Recent Labs Lab 09/10/16 0029  09/11/16 0212 09/12/16 0504 09/12/16 1706 09/13/16 0415  CALCIUM  --   < > 8.9 8.6*  --  8.4*  MG 1.8  --  1.8  --   --  1.8  PHOS 4.1  < > 4.1  --  6.4* 4.2  < > = values in this interval not displayed.  CBC  Recent  Labs Lab 09/11/16 0212 09/12/16 0504 09/13/16 0415  WBC 21.8* 17.1* 13.5*  HGB 11.1* 10.5* 10.3*  HCT 33.8* 31.8* 30.4*  PLT 148* 132* 112*    Coag's  Recent Labs Lab 09/10/16 1145  APTT 44*  INR 1.24    Sepsis Markers  Recent Labs Lab 09/10/16 0839 09/10/16 1145 09/11/16 0658 09/12/16 0504  LATICACIDVEN 5.3* 4.1*  --   --   PROCALCITON <0.10  --  5.47 7.26    ABG  Recent Labs Lab 09/10/16 1200 09/10/16 2253  PHART 7.40 7.43  PCO2ART 41 35  PO2ART 53* 230*    Liver Enzymes  Recent Labs Lab 09/09/16 1754 09/10/16 0623  AST 40 37  ALT 13* 14  ALKPHOS 260* 245*  BILITOT 1.9* 2.2*  ALBUMIN 3.2* 3.2*    Cardiac Enzymes  Recent Labs Lab 09/10/16 0029 09/10/16 0623 09/10/16 1145  TROPONINI 0.03* 0.07* 0.20*    Glucose  Recent Labs Lab 09/11/16 2137 09/12/16 0819 09/12/16 1111 09/12/16 1744 09/12/16 2126 09/13/16 0722  GLUCAP 167* 203* 189* 136* 115* 164*    Imaging No results found.   STUDIES:  Right Knee xray 9/28>>Periprosthetic femoral fracture Left Knee xray 9/28>>No acute fracture, remote healed fractures Left tibia/fibula xray 9/28>>Ankle  fractures but no acute tibial or fibular shaft fractures.  Healed/healing proximal fibular shaft fracture CT of chest 9/29>>Right lower lobe subpleural consolidation and atelectasis  CULTURES: Blood x2 9/29>> Sputum>> MRSA PCR 9/29>>positive  ANTIBIOTICS: None  SIGNIFICANT EVENTS: 9/29-Pt admitted to [facility] with c/o fatigue post fall 9/29-Pt transferred to ICU due to hypotension requiring pressors and acute on chronic hypoxic respiratory failure secondary to CHF exacerbation and ?PE.  PCCM consulted for further management  LINES/TUBES: PIV's x2 9/29>> RIJ 9/29>>  ASSESSMENT / PLAN:  82 yo female admitted with R knee fracture of existing prosthesis, with existing ESRD on HD, now with right pneumonia with septic shock, acute systolic CHF.  Now DNR, family considering change to  comfort measures only.   PULMONARY A: Acute on chronic hypoxic respiratory failure secondary to Pneumonia. Morbid Obesity with OSA (requires CPAP qhs) CT chest images 09/10/16: RLL pneumonia, severe RV dilatation, c/w severe pulmonary hypertension; severely reduced lung volumes due to cardiomegaly and obesity.  P:   Wean off biPAP as tolerated Supplemental O2 to maintain O2 sats 92% or above --Poor prognosis given multiple comorbidities.   CARDIOVASCULAR A:  Atrial Fibrillation Hypotension secondary septic shock Hx: Dysrhythmia, CHF, and hypertension P:   Continue outpatient midodrine Maintain systolic blood pressure 90 or above Levophed drip to maintain systolic blood pressure goal as needed [place] bilateral lower extremities negative for DVT 9/29  Echo 09/11/16; EF 45% (55% on Echo 08/05/15) Pulmonary hypertension with RV dilation.  Empiric steroids.   RENAL A:   ESRD (hemodialysis Monday, Wednesday, and Friday) P:   Discussed with nephrology today, will await family decisions.  Hemodialysis per Nephrology Replace electrolytes as indicated Hyponatremia.   GASTROINTESTINAL A:   No acute issues P:   Keep NPO for now  Continue protonix if able to tolerate po medications  HEMATOLOGIC A:   Leukocytosis, likely reactive.  P:  Trend CBC Monitor for s/sx of bleeding Transfuse for hgb <7  INFECTIOUS A:   Leukocytosis-MRSA PCR positive no infiltrates present on CXR 9/29  P:   Trend PCT if PCT elevated will start abx Trend WBC and monitor fever curve Follow cultures  ENDOCRINE A:   Hx: Diabetes mellitus P:   Continue SSI  NEUROLOGIC A:   No acute issues P:   Avoid sedating medications Reorient pt Lights on during the day Tramadol as needed for pain  MUSCULOSKELETAL  A:  Right femoral fracture and Left ankle fracture Hx: Left tibia and fibula shaft fractures P: Orthopedic consulted appreciate input Strict bedrest Fall precautions Prn Tramadol for  pain   I have personally obtained a history, examined the patient, evaluated Pertinent laboratory and RadioGraphic/imaging results, and  formulated the assessment and plan   The Patient requires high complexity decision making for assessment and support, frequent evaluation and titration of therapies.   No family at bedside, prognosis is poor, recommend palliative care consult Step down status  Corrin Parker, M.D.  [facility]  Medical Director [place] Director [facility] Cardio-Pulmonary Department

## 2016-09-13 NOTE — Progress Notes (Signed)
Palliative Medicine consult noted. Due to high referral volume, there may be a delay seeing this patient. Please call the Palliative Medicine Team office at [phone] if recommendations are needed in the interim.  Thank you for inviting [place] to see this patient.  Marjie Skiff Shateka Petrea, RN, BSN, [facility] 09/13/2016 2:36 PM Cell [phone] 8:00-4:00 Monday-Friday Office [phone]

## 2016-09-13 NOTE — Care Management Important Message (Signed)
Important Message  Patient Details  Name: [name] MRN: [id number] Date of Birth: 13-Dec-1934   Medicare Important Message Given:  Yes    Marshell Garfinkel, RN 09/13/2016, 3:18 PM

## 2016-09-13 NOTE — Evaluation (Signed)
Clinical/Bedside Swallow Evaluation Patient Details  Name: [name] MRN: [id number] Date of Birth: [birth year]  Today's Date: 09/13/2016 Time: SLP Start Time (ACUTE ONLY): 1000 SLP Stop Time (ACUTE ONLY): 1100 SLP Time Calculation (min) (ACUTE ONLY): 60 min  Past Medical History:  Past Medical History:  Diagnosis Date  . A-fib (HCC)    not on anticoagulation  . Anemia   . Bilateral lower extremity edema   . CHF (congestive heart failure) ([place])   . Diabetes mellitus without complication [facility])    Patient takes Insulin  . Dysrhythmia   . ESRD (end stage renal disease) ([place])    Monday, wednesday, Friday DIalysis  . Essential tremor   . HTN (hypertension)   . OSA (obstructive sleep apnea)   . Osteoarthritis   . Renal insufficiency    Patient is on dialysis and normal days are M,W and F.  . Skin cancer    Resected from legs   Past Surgical History:  Past Surgical History:  Procedure Laterality Date  . ABDOMINAL HYSTERECTOMY    . ANKLE FRACTURE SURGERY     right ankle  . CHOLECYSTECTOMY    . ESOPHAGOGASTRODUODENOSCOPY (EGD) WITH PROPOFOL N/A 10/09/2015   Procedure: ESOPHAGOGASTRODUODENOSCOPY (EGD) WITH PROPOFOL;  Surgeon: Hulen Luster, MD;  Location: [facility] ENDOSCOPY;  Service: Gastroenterology;  Laterality: N/A;  . FEMUR FRACTURE SURGERY     left femur  . KNEE ARTHROPLASTY    . NEUROPLASTY / TRANSPOSITION MEDIAN NERVE AT CARPAL TUNNEL BILATERAL    . PERIPHERAL VASCULAR CATHETERIZATION Left 08/06/2015   Procedure: A/V Shuntogram/Fistulagram;  Surgeon: Algernon Huxley, MD;  Location: [place] CV LAB;  Service: Cardiovascular;  Laterality: Left;  . PERIPHERAL VASCULAR CATHETERIZATION Left 11/20/2015   Procedure: A/V Shuntogram/Fistulagram;  Surgeon: Algernon Huxley, MD;  Location: [place] CV LAB;  Service: Cardiovascular;  Laterality: Left;  . PERIPHERAL VASCULAR CATHETERIZATION N/A 11/20/2015   Procedure: A/V Shunt Intervention;  Surgeon: Algernon Huxley, MD;  Location: [place] CV LAB;  Service: Cardiovascular;  Laterality: N/A;  . TUBAL LIGATION     HPI:  Pt is a 82 y.o. female with a known history of HTN, atrial fibrillation, CHF, diabetes, end-stage renal disease on hemodialysis Monday, Wednesday, Friday was in a usual state of health until this evening when she fell at home.  Patient states that she was standing from her wheelchair doing physical therapy when she became weak, slid to the ground to a seated position. She states that she felt her knees buckle, felt a pop in her knees. Patient is currently wearing a mobility boot to the left lower extremity from her fracture several months ago area and aside from minimal daily physical therapy she has been wheelchair bound in her home. Otherwise there has been no change in status. Patient has been taking medication as prescribed and there has been no recent change in medication or diet.  There has been no recent illness, travel or sick contacts. Pt currently presents with R knee fracture of existing prosthesis, with existing ESRD on HD, now with right pneumonia with septic shock, acute systolic CHF. Pt's pulmonary status declined significantly enough to require bipap, but has now weaned. Due to Pt's medical prognosis Pt is now DNR, family considering change to comfort measures only. Pt has recently been NPO due to medical status and need for bipap.    Assessment / Plan / Recommendation Clinical Impression  Pt appeared to adequately tolerate trials of ice chips, thin liquids, and purees  with no immediate overt s/s of aspiration. Pt displayed and intermitent congestive cough prior to and during PO trials, with no change in intensity or frequency. Pt also belched three times total, following two trials of thin liquids, which was followed by a cough as mentioned prior. There was no decline in O2 stats or change in RR/HR during and post PO trials. No PO trials of solid foods were attempted during this eval, will continue to  monitor Pt's toleration of solids (diet as ordered by MD). Husband reported Pt tolerated a few bites of eggs well during the breakfast tray this morning. Recommend modifying diet to Dysphagia 3 diet with Thin liquids, following strict aspiration precautions. Family members and staff were educated to assist Pt to choose soft foods which are easy to chew or could be easily cut. Nursing, Husband, and Pt were educated on aspiration precautions and agree. Speech therapy services will follow up with diet toleration, education.         Aspiration Risk   (Reduced Risk)    Diet Recommendation Thin liquid;Dysphagia 3 (Mech soft), Following Aspiration Precautions  Liquid Administration via: Cup;Straw Medication Administration: Whole meds with liquid (Give in Puree if neccessary for easier swallowing) Supervision: Full supervision/cueing for compensatory strategies;Staff to assist with self feeding Compensations: Minimize environmental distractions;Slow rate;Small sips/bites;Follow solids with liquid Postural Changes: Seated upright at 90 degrees;Remain upright for at least 30 minutes after po intake    Other  Recommendations Recommended Consults:  (Dietician) Oral Care Recommendations: Oral care BID;Staff/trained caregiver to provide oral care   Follow up Recommendations None      Frequency and Duration min 3x week  2 weeks       Prognosis Prognosis for Safe Diet Advancement: Fair Barriers to Reach Goals:  (Medical Status)      Swallow Study   General Date of Onset: 09/09/16 HPI: Pt is a 82 y.o. female with a known history of HTN, atrial fibrillation, CHF, diabetes, end-stage renal disease on hemodialysis Monday, Wednesday, Friday was in a usual state of health until this evening when she fell at home.  Patient states that she was standing from her wheelchair doing physical therapy when she became weak, slid to the ground to a seated position. She states that she felt her knees buckle, felt a pop  in her knees. Patient is currently wearing a mobility boot to the left lower extremity from her fracture several months ago area and aside from minimal daily physical therapy she has been wheelchair bound in her home. Otherwise there has been no change in status. Patient has been taking medication as prescribed and there has been no recent change in medication or diet.  There has been no recent illness, travel or sick contacts. Pt currently presents with R knee fracture of existing prosthesis, with existing ESRD on HD, now with right pneumonia with septic shock, acute systolic CHF. Pt's pulmonary status declined significantly enough to require bipap, but has now weaned. Due to Pt's medical prognosis Pt is now DNR, family considering change to comfort measures only. Pt has recently been NPO due to medical status and need for bipap.  Type of Study: Bedside Swallow Evaluation Previous Swallow Assessment: None indicated Diet Prior to this Study: Regular;Thin liquids Temperature Spikes Noted: No (WBC 13.5) Respiratory Status: Room air History of Recent Intubation: No Behavior/Cognition: Cooperative;Pleasant mood;Alert;Distractible;Requires cueing Oral Cavity Assessment: Within Functional Limits Oral Care Completed by SLP: Recent completion by staff Oral Cavity - Dentition: Adequate natural dentition Vision: Functional for  self-feeding Self-Feeding Abilities: Total assist;Needs set up Patient Positioning: Upright in bed Baseline Vocal Quality: Normal;Low vocal intensity Volitional Cough: Strong;Congested Volitional Swallow: Able to elicit    Oral/Motor/Sensory Function Overall Oral Motor/Sensory Function: Within functional limits   Ice Chips Ice chips: Within functional limits (1 trial) Presentation: Spoon   Thin Liquid Thin Liquid: Within functional limits (4 trials) Presentation: Straw Other Comments: Burped witih cough after trials    Nectar Thick Nectar Thick Liquid: Not tested   Honey Thick  Honey Thick Liquid: Not tested   Puree Puree: Within functional limits (4 trials) Presentation: Spoon   Solid   GO   Solid: Not tested        Rivka Safer, B.A. Clinical Graduate Student 09/13/2016,1:22 PM  This information has been reviewed and agreed upon by this supervising clinician.  Orinda Kenner, [place], CCC-SLP

## 2016-09-13 NOTE — Progress Notes (Addendum)
Patient ID: [name], female   DOB: Dec 01, 1934, 82 y.o.   MRN: [id number]   [employer] PROGRESS NOTE  [name] [id number] DOB: 28-May-1934 DOA: 09/09/2016 PCP: Rusty Aus, MD  HPI/Subjective: Patient is more awake but confused, off levophed and O2 [place]. Objective: Vitals:   09/13/16 1100 09/13/16 1217  BP: (!) 97/55 (!) 107/53  Pulse: (!) 110 (!) 112  Resp: (!) 30   Temp:  97.6 F (36.4 C)    Filed Weights   09/10/16 0124 09/10/16 1000 09/13/16 0444  Weight: 215 lb 2.7 oz (97.6 kg) 218 lb 4.1 oz (99 kg) 222 lb 0.1 oz (100.7 kg)    ROS: Review of Systems  Unable to perform ROS: Mental acuity   Exam: Physical Exam  HENT:  Nose: No mucosal edema.  Unable to look in the mail today.  Eyes: Lids are normal. Pupils are equal, round, and reactive to light.  Neck: No thyroid mass and no thyromegaly present.  Cardiovascular: An irregularly irregular rhythm present.  Murmur heard.  Systolic murmur is present with a grade of 2/6  Respiratory: She has decreased breath sounds in the right lower field and the left lower field. She has no wheezes. She has rhonchi in the right lower field and the left lower field.  GI: Soft. Bowel sounds are normal. There is no tenderness.  Musculoskeletal:       Right ankle: She exhibits swelling.       Left ankle: She exhibits swelling.  Neurological: She is alert.  Able to wiggle toes on bilateral legs  Skin: Skin is warm.  Bruising bilateral lower extremities  Psychiatric:  Confused      Data Reviewed: Basic Metabolic Panel:  Recent Labs Lab 09/09/16 1754 09/10/16 0029 09/10/16 [id number] 09/10/16 1005 09/10/16 1350 09/10/16 1741 09/11/16 0212 09/12/16 0504 09/12/16 1706 09/13/16 0415  NA 125*  --  125*  --   --   --  129* 127*  --  134*  K 5.6*  --  6.4*  --  2.6* 3.6 4.1 5.3*  --  3.7  CL 95*  --  97*  --   --   --  93* 92*  --  95*  CO2 23  --  14*  --   --   --  23 22  --  29  GLUCOSE 153*  --  214*  --    --   --  98 190*  --  161*  BUN 41*  --  45*  --   --   --  26* 46*  --  29*  CREATININE 4.11*  --  4.31*  --   --   --  3.21* 4.39*  --  2.82*  CALCIUM 8.8*  --  8.8*  --   --   --  8.9 8.6*  --  8.4*  MG  --  1.8  --   --   --   --  1.8  --   --  1.8  PHOS  --  4.1  --  5.7*  --   --  4.1  --  6.4* 4.2   Liver Function Tests:  Recent Labs Lab 09/09/16 1754 09/10/16 0623  AST 40 37  ALT 13* 14  ALKPHOS 260* 245*  BILITOT 1.9* 2.2*  PROT 7.1 7.2  ALBUMIN 3.2* 3.2*   No results for input(s): LIPASE, AMYLASE in the last 168 hours. No results for input(s): AMMONIA in the last 168  hours. CBC:  Recent Labs Lab 09/09/16 1754 09/10/16 0623 09/11/16 0212 09/12/16 0504 09/13/16 0415  WBC 15.2* 16.2* 21.8* 17.1* 13.5*  NEUTROABS  --   --  17.5* 14.5*  --   HGB 11.8* 12.2 11.1* 10.5* 10.3*  HCT 35.2 38.3 33.8* 31.8* 30.4*  MCV 101.3* 104.9* 100.2* 100.8* 99.8  PLT 159 148* 148* 132* 112*   Cardiac Enzymes:  Recent Labs Lab 09/09/16 1754 09/10/16 0029 09/10/16 0623 09/10/16 1145  TROPONINI 0.03* 0.03* 0.07* 0.20*    CBG:  Recent Labs Lab 09/12/16 1744 09/12/16 2126 09/13/16 0722 09/13/16 1105 09/13/16 1242  GLUCAP 136* 115* 164* 181* 164*    Recent Results (from the past 240 hour(s))  MRSA PCR Screening     Status: Abnormal   Collection Time: 09/10/16  1:47 AM  Result Value Ref Range Status   MRSA by PCR POSITIVE (A) NEGATIVE Final    Comment:        The GeneXpert MRSA Assay (FDA approved for NASAL specimens only), is one component of a comprehensive MRSA colonization surveillance program. It is not intended to diagnose MRSA infection nor to guide or monitor treatment for MRSA infections. RESULT CALLED TO, READ BACK BY AND VERIFIED WITH: MONIQUE ROBERSON ON 09/10/16 AT 0359 BY TLB   CULTURE, BLOOD (ROUTINE X 2) w Reflex to ID Panel     Status: None (Preliminary result)   Collection Time: 09/10/16  8:19 AM  Result Value Ref Range Status   Specimen  Description BLOOD RIGHT HAND  Final   Special Requests BOTTLES DRAWN AEROBIC AND ANAEROBIC .5ML  Final   Culture NO GROWTH 3 DAYS  Final   Report Status PENDING  Incomplete  CULTURE, BLOOD (ROUTINE X 2) w Reflex to ID Panel     Status: None (Preliminary result)   Collection Time: 09/10/16  6:04 PM  Result Value Ref Range Status   Specimen Description BLOOD RIGHT HAND  Final   Special Requests BOTTLES DRAWN AEROBIC AND ANAEROBIC .5ML  Final   Culture NO GROWTH 3 DAYS  Final   Report Status PENDING  Incomplete     Studies: No results found.  Scheduled Meds: . allopurinol  100 mg Oral Daily  . azithromycin  500 mg Intravenous Q24H  . ceFEPime (MAXIPIME) IV  1 g Intravenous Q24H  . Chlorhexidine Gluconate Cloth  6 each Topical Q0600  . cholecalciferol  1,000 Units Oral Daily  . docusate sodium  100 mg Oral BID  . fluticasone  2 spray Each Nare QHS  . heparin subcutaneous  5,000 Units Subcutaneous Q8H  . hydrocortisone sod succinate (SOLU-CORTEF) inj  50 mg Intravenous Q8H  . insulin aspart  0-20 Units Subcutaneous TID WC  . insulin aspart  0-5 Units Subcutaneous QHS  . mouth rinse  15 mL Mouth Rinse q12n4p  . midodrine  5 mg Oral TID WC  . mupirocin ointment  1 application Nasal BID  . pantoprazole  40 mg Oral QAC breakfast  . polyethylene glycol  17 g Oral Daily  . sevelamer carbonate  1,600 mg Oral TID WC  . sevelamer carbonate  800 mg Oral BID BM  . sodium chloride flush  10-40 mL Intracatheter Q12H  . sodium chloride flush  3 mL Intravenous Q12H  . sodium chloride flush  3 mL Intravenous Q12H  . vancomycin  1,000 mg Intravenous Q M,W,F-HD   Continuous Infusions: . dexmedetomidine Stopped (09/12/16 1018)  . norepinephrine (LEVOPHED) Adult infusion Stopped (09/13/16 0300)  . phenylephrine (NEO-SYNEPHRINE)  Adult infusion Stopped (09/10/16 1720)    Assessment/Plan:  1. Septic shock with multiorgan failure. From the pneumonia. On vancomycin, cefepime and Zithromax. off  Levophed, better leukocytosis. Resume midodrine per Dr. Juleen China. 2. Acute hypoxic respiratory failure. Off bipap and off O2 via nasal canula.wean down steroids. 3. Acute metabolic encephalopathy secondary to sepsis. Patient able to talk with me today. 4. Right hip fracture. F/u Orthopedic for possible surgery. Not a surgical candidate at this point. 5. End-stage renal disease on hemodialysis. Continue HD as routine per Dr. Juleen China. 6.  Hyperkalemia and Hyponatremia. Improved with HD yesterday. 7. Type 2 diabetes on sliding scale 8. History of atrial fibrillation 9. History of congestive heart failure. Stable. 10. History of gout on allopurinol General condition is better, but still poor prognosis, waiting for palliative care consult. I discussed with Dr. Juleen China. Code Status:     Code Status Orders        Start     Ordered   09/11/16 1213  Do not attempt resuscitation (DNR)  Continuous    Question Answer Comment  In the event of cardiac or respiratory ARREST Do not call a "code blue"   In the event of cardiac or respiratory ARREST Do not perform Intubation, CPR, defibrillation or ACLS   In the event of cardiac or respiratory ARREST Use medication by any route, position, wound care, and other measures to relive pain and suffering. May use oxygen, suction and manual treatment of airway obstruction as needed for comfort.      09/11/16 1212    Code Status History    Date Active Date Inactive Code Status Order ID Comments User Context   09/10/2016 12:48 AM 09/10/2016  2:44 PM Full Code [id number]  Harvie Bridge, DO Inpatient   04/06/2016  4:59 PM 04/10/2016  7:57 PM Full Code [id number]  Epifanio Lesches, MD ED   11/20/2015  3:03 PM 11/20/2015  6:10 PM Full Code [id number]  Algernon Huxley, MD Inpatient   08/14/2015  5:40 AM 08/21/2015 10:45 PM Full Code [id number]  Juluis Mire, MD Inpatient   08/02/2015  1:44 PM 08/11/2015 10:21 PM Full Code [id number]  Gladstone Lighter, MD Inpatient      Family Communication: Case discussed with her husband and niece. Disposition Plan: To be determined  Consultants:  Critical care specialist  Nephrology  Orthopedic surgery  Antibiotics:  Rocephin  Zithromax  Time spent: 42 minutes.  Demetrios Loll  [employer]

## 2016-09-13 NOTE — Progress Notes (Signed)
[facility]  ROUNDING NOTE   Subjective:   Hemodialysis yesterday. No UF but electrolytes have improved.  Husband and niece are at bedside Off vasopressors Speaking full sentences. Breathing room air.    Objective:  Vital signs in last 24 hours:  Temp:  [97.9 F (36.6 C)-99.1 F (37.3 C)] 97.9 F (36.6 C) (10/02 0400) Pulse Rate:  [38-109] 109 (10/02 0630) Resp:  [13-30] 25 (10/02 0630) BP: (84-138)/(47-114) 103/55 (10/02 0630) SpO2:  [96 %-100 %] 100 % (10/02 0630) FiO2 (%):  [50 %] 50 % (10/02 0500) Weight:  [100.7 kg (222 lb 0.1 oz)] 100.7 kg (222 lb 0.1 oz) (10/02 0444)  Weight change:  Filed Weights   09/10/16 0124 09/10/16 1000 09/13/16 0444  Weight: 97.6 kg (215 lb 2.7 oz) 99 kg (218 lb 4.1 oz) 100.7 kg (222 lb 0.1 oz)    Intake/Output: I/O last 3 completed shifts: In: 2097.9 [P.O.:315; I.V.:732.9; IV Piggyback:1050] Out: 0    Intake/Output this shift:  No intake/output data recorded.  Physical Exam: General: ill appearing.   Head: Normocephalic, atraumatic  Eyes: Anicteric  Neck: Supple, trachea midline  Lungs:  clear  Heart: Tachycardia, irregular  Abdomen:  Soft, nontender, BS present   Extremities: Right leg in an immobilizer, no peripheral edema  Neurologic: Alert and oriented.   Skin: No lesions  Access: LUE AVF    Basic Metabolic Panel:  Recent Labs Lab 09/09/16 1754 09/10/16 0029 09/10/16 0623 09/10/16 1005 09/10/16 1350 09/10/16 1741 09/11/16 0212 09/12/16 0504 09/12/16 1706 09/13/16 0415  NA 125*  --  125*  --   --   --  129* 127*  --  134*  K 5.6*  --  6.4*  --  2.6* 3.6 4.1 5.3*  --  3.7  CL 95*  --  97*  --   --   --  93* 92*  --  95*  CO2 23  --  14*  --   --   --  23 22  --  29  GLUCOSE 153*  --  214*  --   --   --  98 190*  --  161*  BUN 41*  --  45*  --   --   --  26* 46*  --  29*  CREATININE 4.11*  --  4.31*  --   --   --  3.21* 4.39*  --  2.82*  CALCIUM 8.8*  --  8.8*  --   --   --  8.9 8.6*  --  8.4*   MG  --  1.8  --   --   --   --  1.8  --   --  1.8  PHOS  --  4.1  --  5.7*  --   --  4.1  --  6.4* 4.2    Liver Function Tests:  Recent Labs Lab 09/09/16 1754 09/10/16 0623  AST 40 37  ALT 13* 14  ALKPHOS 260* 245*  BILITOT 1.9* 2.2*  PROT 7.1 7.2  ALBUMIN 3.2* 3.2*   No results for input(s): LIPASE, AMYLASE in the last 168 hours. No results for input(s): AMMONIA in the last 168 hours.  CBC:  Recent Labs Lab 09/09/16 1754 09/10/16 0623 09/11/16 0212 09/12/16 0504 09/13/16 0415  WBC 15.2* 16.2* 21.8* 17.1* 13.5*  NEUTROABS  --   --  17.5* 14.5*  --   HGB 11.8* 12.2 11.1* 10.5* 10.3*  HCT 35.2 38.3 33.8* 31.8* 30.4*  MCV 101.3* 104.9*  100.2* 100.8* 99.8  PLT 159 148* 148* 132* 112*    Cardiac Enzymes:  Recent Labs Lab 09/09/16 1754 09/10/16 0029 09/10/16 0623 09/10/16 1145  TROPONINI 0.03* 0.03* 0.07* 0.20*    BNP: Invalid input(s): POCBNP  CBG:  Recent Labs Lab 09/12/16 0819 09/12/16 1111 09/12/16 1744 09/12/16 2126 09/13/16 0722  GLUCAP 203* 189* 136* 115* 164*    Microbiology: Results for orders placed or performed during the hospital encounter of 09/09/16  MRSA PCR Screening     Status: Abnormal   Collection Time: 09/10/16  1:47 AM  Result Value Ref Range Status   MRSA by PCR POSITIVE (A) NEGATIVE Final    Comment:        The GeneXpert MRSA Assay (FDA approved for NASAL specimens only), is one component of a comprehensive MRSA colonization surveillance program. It is not intended to diagnose MRSA infection nor to guide or monitor treatment for MRSA infections. RESULT CALLED TO, READ BACK BY AND VERIFIED WITH: MONIQUE ROBERSON ON 09/10/16 AT 0359 BY TLB   CULTURE, BLOOD (ROUTINE X 2) w Reflex to ID Panel     Status: None (Preliminary result)   Collection Time: 09/10/16  8:19 AM  Result Value Ref Range Status   Specimen Description BLOOD RIGHT HAND  Final   Special Requests BOTTLES DRAWN AEROBIC AND ANAEROBIC .5ML  Final    Culture NO GROWTH 2 DAYS  Final   Report Status PENDING  Incomplete  CULTURE, BLOOD (ROUTINE X 2) w Reflex to ID Panel     Status: None (Preliminary result)   Collection Time: 09/10/16  6:04 PM  Result Value Ref Range Status   Specimen Description BLOOD RIGHT HAND  Final   Special Requests BOTTLES DRAWN AEROBIC AND ANAEROBIC .5ML  Final   Culture NO GROWTH 2 DAYS  Final   Report Status PENDING  Incomplete    Coagulation Studies:  Recent Labs  09/10/16 1145  LABPROT 15.7*  INR 1.24    Urinalysis: No results for input(s): COLORURINE, LABSPEC, PHURINE, GLUCOSEU, HGBUR, BILIRUBINUR, KETONESUR, PROTEINUR, UROBILINOGEN, NITRITE, LEUKOCYTESUR in the last 72 hours.  Invalid input(s): APPERANCEUR    Imaging: No results found.   Medications:   . dexmedetomidine Stopped (09/12/16 1018)  . norepinephrine (LEVOPHED) Adult infusion Stopped (09/13/16 0300)  . phenylephrine (NEO-SYNEPHRINE) Adult infusion Stopped (09/10/16 1720)   . allopurinol  100 mg Oral Daily  . azithromycin  500 mg Intravenous Q24H  . ceFEPime (MAXIPIME) IV  1 g Intravenous Q24H  . cholecalciferol  1,000 Units Oral Daily  . docusate sodium  100 mg Oral BID  . fluticasone  2 spray Each Nare QHS  . heparin subcutaneous  5,000 Units Subcutaneous Q8H  . hydrocortisone sod succinate (SOLU-CORTEF) inj  50 mg Intravenous Q8H  . insulin aspart  0-20 Units Subcutaneous TID WC  . insulin aspart  0-5 Units Subcutaneous QHS  . mouth rinse  15 mL Mouth Rinse q12n4p  . midodrine  5 mg Oral TID WC  . pantoprazole  40 mg Oral QAC breakfast  . polyethylene glycol  17 g Oral Daily  . sevelamer carbonate  1,600 mg Oral TID WC  . sevelamer carbonate  800 mg Oral BID BM  . sodium chloride flush  10-40 mL Intracatheter Q12H  . sodium chloride flush  3 mL Intravenous Q12H  . sodium chloride flush  3 mL Intravenous Q12H  . vancomycin  1,000 mg Intravenous Q M,W,F-HD   sodium chloride, acetaminophen **OR** acetaminophen,  ipratropium-albuterol, morphine injection, nystatin  cream, ondansetron **OR** ondansetron (ZOFRAN) IV, senna, sodium chloride flush, sodium chloride flush  Assessment/ Plan:  82 y.o. white female with end-stage renal disease on hemodialysis, diabetes mellitus type 2, hypertension, obstructive sleep apnea, atrial fibrillation, arthritis, anemia admitted on 09/09/2016 for Hyperkalemia [E87.5] Hyponatremia [E87.1] Weakness [R53.1] Hypoxia [R09.02] Closed nondisplaced fracture of lateral condyle of right femur, initial encounter ([place]) [S72.424A]   MWF [place]  1.  End-stage renal disease with hyperkalemia: hemodialysis last night. Minimal UF. Tolerated treatment well. Potassium now at goal.  Monitor daily for dialysis need. Next scheduled dialysis for Wednesday, then resume MWF schedule.   2.  Hypotension: off vasopressors - midodrine has been restarted.   3.  Anemia of chronic kidney disease - EPO with dialysis  4.  Secondary hyperparathyroidism:  Phosphorus and calcium at goal.  -  sevelamer with meals.   5. Acute respiratory failure: with pneumonia: breathing room air this morning - Vanco, azithro, cefepime - hydrocortisone   LOS: [place], [place] 10/2/20179:24 AM

## 2016-09-13 NOTE — Plan of Care (Signed)
Problem: SLP Dysphagia Goals Goal: Misc Dysphagia Goal Pt will safely tolerate po diet of least restrictive consistency w/ no overt s/s of aspiration noted by Staff/pt/family x3 sessions.    

## 2016-09-14 LAB — CBC
HCT: 31.6 % — ABNORMAL LOW (ref 35.0–47.0)
HEMOGLOBIN: 10.6 g/dL — AB (ref 12.0–16.0)
MCH: 34 pg (ref 26.0–34.0)
MCHC: 33.6 g/dL (ref 32.0–36.0)
MCV: 101 fL — AB (ref 80.0–100.0)
Platelets: 108 10*3/uL — ABNORMAL LOW (ref 150–440)
RBC: 3.13 MIL/uL — AB (ref 3.80–5.20)
RDW: 16.3 % — ABNORMAL HIGH (ref 11.5–14.5)
WBC: 14 10*3/uL — ABNORMAL HIGH (ref 3.6–11.0)

## 2016-09-14 LAB — GLUCOSE, CAPILLARY
GLUCOSE-CAPILLARY: 175 mg/dL — AB (ref 65–99)
GLUCOSE-CAPILLARY: 110 mg/dL — AB (ref 65–99)
Glucose-Capillary: 158 mg/dL — ABNORMAL HIGH (ref 65–99)
Glucose-Capillary: 100 mg/dL — ABNORMAL HIGH (ref 65–99)

## 2016-09-14 MED ORDER — ALBUMIN HUMAN 25 % IV SOLN
12.5000 g | Freq: Once | INTRAVENOUS | Status: AC
  Administered 2016-09-14: 12.5 g via INTRAVENOUS
  Filled 2016-09-14: qty 50

## 2016-09-14 MED ORDER — GUAIFENESIN-DM 100-10 MG/5ML PO SYRP: 5.0000 mL | ORAL_SOLUTION | ORAL | Status: DC | PRN

## 2016-09-14 MED ORDER — EPOETIN ALFA 4000 UNIT/ML IJ SOLN
4000.0000 [IU] | Freq: Once | INTRAMUSCULAR | Status: AC
Start: 2016-09-14 — End: 2016-09-14
  Administered 2016-09-14: 4000 [IU] via INTRAVENOUS
  Filled 2016-09-14: qty 1

## 2016-09-14 NOTE — Progress Notes (Signed)
Patient ID: Aiyannah Dishman, female   DOB: October 19, 1934, 82 y.o.   MRN: [id number]   [facility] PROGRESS NOTE  Allissia Lonski [id number] DOB: 1934-04-09 DOA: 09/09/2016 PCP: Rusty Aus, MD  HPI/Subjective: Patient is confused, on CPAP. Objective: Vitals:   09/14/16 0730 09/14/16 1438  BP: 107/63 (!) 103/55  Pulse: (!) 108 (!) 106  Resp: 14   Temp: 97.5 F (36.4 C) 98.6 F (37 C)    Filed Weights   09/10/16 0124 09/10/16 1000 09/13/16 0444  Weight: 215 lb 2.7 oz (97.6 kg) 218 lb 4.1 oz (99 kg) 222 lb 0.1 oz (100.7 kg)    ROS: Review of Systems  Unable to perform ROS: Mental acuity   Exam: Physical Exam  HENT:  Nose: No mucosal edema.  Unable to look in the mail today.  Eyes: Lids are normal. Pupils are equal, round, and reactive to light.  Neck: No thyroid mass and no thyromegaly present.  Cardiovascular: An irregularly irregular rhythm present.  Murmur heard.  Systolic murmur is present with a grade of 2/6  Respiratory: She has decreased breath sounds in the right lower field and the left lower field. She has no wheezes. She has no rhonchi.  GI: Soft. Bowel sounds are normal. There is no tenderness.  Musculoskeletal:       Right ankle: She exhibits swelling.       Left ankle: She exhibits swelling.  Neurological: She is alert.  Able to wiggle toes on bilateral legs  Skin: Skin is warm.  Bruising bilateral lower extremities  Psychiatric:  Confused      Data Reviewed: Basic Metabolic Panel:  Recent Labs Lab 09/09/16 1754 09/10/16 0029 09/10/16 [id number] 09/10/16 1005 09/10/16 1350 09/10/16 1741 09/11/16 0212 09/12/16 0504 09/12/16 1706 09/13/16 0415  NA 125*  --  125*  --   --   --  129* 127*  --  134*  K 5.6*  --  6.4*  --  2.6* 3.6 4.1 5.3*  --  3.7  CL 95*  --  97*  --   --   --  93* 92*  --  95*  CO2 23  --  14*  --   --   --  23 22  --  29  GLUCOSE 153*  --  214*  --   --   --  98 190*  --  161*  BUN 41*  --  45*  --   --   --  26* 46*   --  29*  CREATININE 4.11*  --  4.31*  --   --   --  3.21* 4.39*  --  2.82*  CALCIUM 8.8*  --  8.8*  --   --   --  8.9 8.6*  --  8.4*  MG  --  1.8  --   --   --   --  1.8  --   --  1.8  PHOS  --  4.1  --  5.7*  --   --  4.1  --  6.4* 4.2   Liver Function Tests:  Recent Labs Lab 09/09/16 1754 09/10/16 0623  AST 40 37  ALT 13* 14  ALKPHOS 260* 245*  BILITOT 1.9* 2.2*  PROT 7.1 7.2  ALBUMIN 3.2* 3.2*   No results for input(s): LIPASE, AMYLASE in the last 168 hours. No results for input(s): AMMONIA in the last 168 hours. CBC:  Recent Labs Lab 09/10/16 [id number] 09/11/16 [id number] 09/12/16 0504 09/13/16 [id number]  09/14/16 0508  WBC 16.2* 21.8* 17.1* 13.5* 14.0*  NEUTROABS  --  17.5* 14.5*  --   --   HGB 12.2 11.1* 10.5* 10.3* 10.6*  HCT 38.3 33.8* 31.8* 30.4* 31.6*  MCV 104.9* 100.2* 100.8* 99.8 101.0*  PLT 148* 148* 132* 112* 108*   Cardiac Enzymes:  Recent Labs Lab 09/09/16 1754 09/10/16 0029 09/10/16 0623 09/10/16 1145  TROPONINI 0.03* 0.03* 0.07* 0.20*    CBG:  Recent Labs Lab 09/13/16 1242 09/13/16 1614 09/13/16 2056 09/14/16 0728 09/14/16 1129  GLUCAP 164* 187* 157* 158* 175*    Recent Results (from the past 240 hour(s))  MRSA PCR Screening     Status: Abnormal   Collection Time: 09/10/16  1:47 AM  Result Value Ref Range Status   MRSA by PCR POSITIVE (A) NEGATIVE Final    Comment:        The GeneXpert MRSA Assay (FDA approved for NASAL specimens only), is one component of a comprehensive MRSA colonization surveillance program. It is not intended to diagnose MRSA infection nor to guide or monitor treatment for MRSA infections. RESULT CALLED TO, READ BACK BY AND VERIFIED WITH: MONIQUE ROBERSON ON 09/10/16 AT 0359 BY TLB   CULTURE, BLOOD (ROUTINE X 2) w Reflex to ID Panel     Status: None (Preliminary result)   Collection Time: 09/10/16  8:19 AM  Result Value Ref Range Status   Specimen Description BLOOD RIGHT HAND  Final   Special Requests BOTTLES DRAWN  AEROBIC AND ANAEROBIC .5ML  Final   Culture NO GROWTH 4 DAYS  Final   Report Status PENDING  Incomplete  CULTURE, BLOOD (ROUTINE X 2) w Reflex to ID Panel     Status: None (Preliminary result)   Collection Time: 09/10/16  6:04 PM  Result Value Ref Range Status   Specimen Description BLOOD RIGHT HAND  Final   Special Requests BOTTLES DRAWN AEROBIC AND ANAEROBIC .5ML  Final   Culture NO GROWTH 4 DAYS  Final   Report Status PENDING  Incomplete     Studies: No results found.  Scheduled Meds: . albumin human  12.5 g Intravenous Once  . allopurinol  100 mg Oral Daily  . azithromycin  500 mg Intravenous Q24H  . ceFEPime (MAXIPIME) IV  1 g Intravenous Q24H  . Chlorhexidine Gluconate Cloth  6 each Topical Q0600  . cholecalciferol  1,000 Units Oral Daily  . docusate sodium  100 mg Oral BID  . epoetin (EPOGEN/PROCRIT) injection  4,000 Units Intravenous Once  . fluticasone  2 spray Each Nare QHS  . heparin subcutaneous  5,000 Units Subcutaneous Q8H  . hydrocortisone sod succinate (SOLU-CORTEF) inj  50 mg Intravenous Q12H  . insulin aspart  0-20 Units Subcutaneous TID WC  . insulin aspart  0-5 Units Subcutaneous QHS  . mouth rinse  15 mL Mouth Rinse q12n4p  . midodrine  5 mg Oral TID WC  . mupirocin ointment  1 application Nasal BID  . pantoprazole  40 mg Oral QAC breakfast  . polyethylene glycol  17 g Oral Daily  . sevelamer carbonate  1,600 mg Oral TID WC  . sevelamer carbonate  800 mg Oral BID BM  . sodium chloride flush  10-40 mL Intracatheter Q12H  . sodium chloride flush  3 mL Intravenous Q12H  . sodium chloride flush  3 mL Intravenous Q12H  . vancomycin  1,000 mg Intravenous Q M,W,F-HD   Continuous Infusions: . dexmedetomidine Stopped (09/12/16 1018)    Assessment/Plan:  1. Septic shock  with multiorgan failure. From the pneumonia. On vancomycin, cefepime and Zithromax. off Levophed, better leukocytosis. Resumed midodrine per Dr. Juleen China. 2. Acute hypoxic respiratory failure.  Off bipap and off O2 via nasal canula.wean down steroids. 3. Acute metabolic encephalopathy secondary to sepsis.  4. Right hip fracture. F/u Orthopedic for possible surgery. Not a surgical candidate at this point. 5. End-stage renal disease on hemodialysis. Continue HD as routine per Dr. Juleen China. 6.  Hyperkalemia and Hyponatremia. Improved with HD yesterday. 7. Type 2 diabetes on sliding scale 8. History of atrial fibrillation 9. History of congestive heart failure. Stable. 10. History of gout on allopurinol General condition is better, but still poor prognosis, waiting for palliative care consult. I discussed with Dr. Juleen China. Code Status:     Code Status Orders        Start     Ordered   09/11/16 1213  Do not attempt resuscitation (DNR)  Continuous    Question Answer Comment  In the event of cardiac or respiratory ARREST Do not call a "code blue"   In the event of cardiac or respiratory ARREST Do not perform Intubation, CPR, defibrillation or ACLS   In the event of cardiac or respiratory ARREST Use medication by any route, position, wound care, and other measures to relive pain and suffering. May use oxygen, suction and manual treatment of airway obstruction as needed for comfort.      09/11/16 1212    Code Status History    Date Active Date Inactive Code Status Order ID Comments User Context   09/10/2016 12:48 AM 09/10/2016  2:44 PM Full Code [id number]  Harvie Bridge, DO Inpatient   04/06/2016  4:59 PM 04/10/2016  7:57 PM Full Code [id number]  Epifanio Lesches, MD ED   11/20/2015  3:03 PM 11/20/2015  6:10 PM Full Code [id number]  Algernon Huxley, MD Inpatient   08/14/2015  5:40 AM 08/21/2015 10:45 PM Full Code [id number]  Juluis Mire, MD Inpatient   08/02/2015  1:44 PM 08/11/2015 10:21 PM Full Code [id number]  Gladstone Lighter, MD Inpatient     Family Communication: Case discussed with her husband. Disposition Plan: To be determined  Consultants:  Critical care  specialist  Nephrology  Orthopedic surgery  Antibiotics:  Rocephin  Zithromax  Time spent: 42 minutes.  Demetrios Loll  [employer]

## 2016-09-14 NOTE — Progress Notes (Signed)
Tx delay d/t when pt arrived floor RN A. Rast had to come to HD unit. New blood on pt sheets under left arm and on left side of gown. This RN and A. Rast RN assessed and could not identify source of blood. Possible small skin tear on posterior upper left arm.

## 2016-09-14 NOTE — Progress Notes (Signed)
Hd tx start  

## 2016-09-14 NOTE — Progress Notes (Signed)
[facility]  ROUNDING NOTE   Subjective:   With CPAP this morning.  Family at bedside. Family is concerned with confusion Complains of shortness of breath   Objective:  Vital signs in last 24 hours:  Temp:  [97.3 F (36.3 C)-97.6 F (36.4 C)] 97.5 F (36.4 C) (10/03 0730) Pulse Rate:  [106-113] 108 (10/03 0730) Resp:  [12-30] 14 (10/03 0730) BP: (94-111)/(49-63) 107/63 (10/03 0730) SpO2:  [91 %-99 %] 99 % (10/03 0730)  Weight change:  Filed Weights   09/10/16 0124 09/10/16 1000 09/13/16 0444  Weight: 97.6 kg (215 lb 2.7 oz) 99 kg (218 lb 4.1 oz) 100.7 kg (222 lb 0.1 oz)    Intake/Output: I/O last 3 completed shifts: In: 246.4 [I.V.:46.4; IV Piggyback:200] Out: 0    Intake/Output this shift:  No intake/output data recorded.  Physical Exam: General: ill appearing.   Head: Normocephalic, atraumatic  Eyes: Anicteric  Neck: Supple, trachea midline  Lungs:  Crackles, CPAP  Heart: Tachycardia, irregular  Abdomen:  Soft, nontender, BS present   Extremities: Right leg in an immobilizer, no peripheral edema  Neurologic: Alert and oriented.   Skin: No lesions  Access: LUE AVF    Basic Metabolic Panel:  Recent Labs Lab 09/09/16 1754 09/10/16 0029 09/10/16 0623 09/10/16 1005 09/10/16 1350 09/10/16 1741 09/11/16 0212 09/12/16 0504 09/12/16 1706 09/13/16 0415  NA 125*  --  125*  --   --   --  129* 127*  --  134*  K 5.6*  --  6.4*  --  2.6* 3.6 4.1 5.3*  --  3.7  CL 95*  --  97*  --   --   --  93* 92*  --  95*  CO2 23  --  14*  --   --   --  23 22  --  29  GLUCOSE 153*  --  214*  --   --   --  98 190*  --  161*  BUN 41*  --  45*  --   --   --  26* 46*  --  29*  CREATININE 4.11*  --  4.31*  --   --   --  3.21* 4.39*  --  2.82*  CALCIUM 8.8*  --  8.8*  --   --   --  8.9 8.6*  --  8.4*  MG  --  1.8  --   --   --   --  1.8  --   --  1.8  PHOS  --  4.1  --  5.7*  --   --  4.1  --  6.4* 4.2    Liver Function Tests:  Recent Labs Lab 09/09/16 1754  09/10/16 0623  AST 40 37  ALT 13* 14  ALKPHOS 260* 245*  BILITOT 1.9* 2.2*  PROT 7.1 7.2  ALBUMIN 3.2* 3.2*   No results for input(s): LIPASE, AMYLASE in the last 168 hours. No results for input(s): AMMONIA in the last 168 hours.  CBC:  Recent Labs Lab 09/10/16 0623 09/11/16 0212 09/12/16 0504 09/13/16 0415 09/14/16 0508  WBC 16.2* 21.8* 17.1* 13.5* 14.0*  NEUTROABS  --  17.5* 14.5*  --   --   HGB 12.2 11.1* 10.5* 10.3* 10.6*  HCT 38.3 33.8* 31.8* 30.4* 31.6*  MCV 104.9* 100.2* 100.8* 99.8 101.0*  PLT 148* 148* 132* 112* 108*    Cardiac Enzymes:  Recent Labs Lab 09/09/16 1754 09/10/16 0029 09/10/16 0623 09/10/16 1145  TROPONINI 0.03* 0.03*  0.07* 0.20*    BNP: Invalid input(s): POCBNP  CBG:  Recent Labs Lab 09/13/16 1105 09/13/16 1242 09/13/16 1614 09/13/16 2056 09/14/16 0728  GLUCAP 181* 164* 187* 157* 158*    Microbiology: Results for orders placed or performed during the hospital encounter of 09/09/16  MRSA PCR Screening     Status: Abnormal   Collection Time: 09/10/16  1:47 AM  Result Value Ref Range Status   MRSA by PCR POSITIVE (A) NEGATIVE Final    Comment:        The GeneXpert MRSA Assay (FDA approved for NASAL specimens only), is one component of a comprehensive MRSA colonization surveillance program. It is not intended to diagnose MRSA infection nor to guide or monitor treatment for MRSA infections. RESULT CALLED TO, READ BACK BY AND VERIFIED WITH: MONIQUE ROBERSON ON 09/10/16 AT 0359 BY TLB   CULTURE, BLOOD (ROUTINE X 2) w Reflex to ID Panel     Status: None (Preliminary result)   Collection Time: 09/10/16  8:19 AM  Result Value Ref Range Status   Specimen Description BLOOD RIGHT HAND  Final   Special Requests BOTTLES DRAWN AEROBIC AND ANAEROBIC .5ML  Final   Culture NO GROWTH 4 DAYS  Final   Report Status PENDING  Incomplete  CULTURE, BLOOD (ROUTINE X 2) w Reflex to ID Panel     Status: None (Preliminary result)   Collection  Time: 09/10/16  6:04 PM  Result Value Ref Range Status   Specimen Description BLOOD RIGHT HAND  Final   Special Requests BOTTLES DRAWN AEROBIC AND ANAEROBIC .5ML  Final   Culture NO GROWTH 4 DAYS  Final   Report Status PENDING  Incomplete    Coagulation Studies: No results for input(s): LABPROT, INR in the last 72 hours.  Urinalysis: No results for input(s): COLORURINE, LABSPEC, PHURINE, GLUCOSEU, HGBUR, BILIRUBINUR, KETONESUR, PROTEINUR, UROBILINOGEN, NITRITE, LEUKOCYTESUR in the last 72 hours.  Invalid input(s): APPERANCEUR    Imaging: No results found.   Medications:   . dexmedetomidine Stopped (09/12/16 1018)   . allopurinol  100 mg Oral Daily  . azithromycin  500 mg Intravenous Q24H  . ceFEPime (MAXIPIME) IV  1 g Intravenous Q24H  . Chlorhexidine Gluconate Cloth  6 each Topical Q0600  . cholecalciferol  1,000 Units Oral Daily  . docusate sodium  100 mg Oral BID  . fluticasone  2 spray Each Nare QHS  . heparin subcutaneous  5,000 Units Subcutaneous Q8H  . hydrocortisone sod succinate (SOLU-CORTEF) inj  50 mg Intravenous Q12H  . insulin aspart  0-20 Units Subcutaneous TID WC  . insulin aspart  0-5 Units Subcutaneous QHS  . mouth rinse  15 mL Mouth Rinse q12n4p  . midodrine  5 mg Oral TID WC  . mupirocin ointment  1 application Nasal BID  . pantoprazole  40 mg Oral QAC breakfast  . polyethylene glycol  17 g Oral Daily  . sevelamer carbonate  1,600 mg Oral TID WC  . sevelamer carbonate  800 mg Oral BID BM  . sodium chloride flush  10-40 mL Intracatheter Q12H  . sodium chloride flush  3 mL Intravenous Q12H  . sodium chloride flush  3 mL Intravenous Q12H  . vancomycin  1,000 mg Intravenous Q M,W,F-HD   sodium chloride, acetaminophen **OR** acetaminophen, ipratropium-albuterol, morphine injection, nystatin cream, ondansetron **OR** ondansetron (ZOFRAN) IV, senna, sodium chloride flush, sodium chloride flush  Assessment/ Plan:  82 y.o. white female with end-stage renal  disease on hemodialysis, diabetes mellitus type 2, hypertension, obstructive  sleep apnea, atrial fibrillation, arthritis, anemia admitted on 09/09/2016 for Hyperkalemia [E87.5] Hyponatremia [E87.1] Weakness [R53.1] Hypoxia [R09.02] Closed nondisplaced fracture of lateral condyle of right femur, initial encounter ([place]) [S72.424A]   MWF [place]  1.  End-stage renal disease with hyperkalemia: hemodialysis for today. IV Albumin and midodrine with treatment. UF goal of 1.5 litres.  - monitor daily for dailysis need. - Low potassium diet.   2.  Hypotension: off vasopressors - midodrine has been restarted.  - IV albumin with HD treatment  3.  Anemia of chronic kidney disease - EPO with dialysis  4.  Secondary hyperparathyroidism:  Phosphorus and calcium at goal.  -  sevelamer with meals.   5. Acute respiratory failure: with pneumonia: on CPAP this morning - Vanco, azithro, cefepime - hydrocortisone   LOS: [place], [place] 10/3/20179:36 AM

## 2016-09-14 NOTE — Care Management Note (Signed)
Case Management Note  Patient Details  Name: Kayra Crowell MRN: [id number] Date of Birth: 01-Apr-1934  Subjective/Objective:                  Met with patient and her husband to discuss discharge planning. I have met with this family on other occassions. Patient always wants to return home however she has gone to SNF at [facility] and [facility] but each time patient left before treatment finished. She is no longer on home O2. She does have a CPAP at home. She has a hospital bed, wheelchair, walker, [employer], nebulizer at home. She has used [facility] home [facility] in the past and declined them again. Palliative pending. Husband thinks she's dying however patient is fighting- per husband. Patient will need EMS to home. She is not able to walk for some time due to frequent falls and leg fractures.  PCP M. Miller.   Action/Plan: RN updated on O2 potential need. List of home health agencies left with patient/husband. RNCM will continue to follow.   Expected Discharge Date:                  Expected Discharge Plan:     In-House Referral:     Discharge planning Services  CM Consult  Post Acute Care Choice:    Choice offered to:  Patient, Spouse  DME Arranged:  [facility] DME Agency:  [place]:    [place] Agency:     Status of Service:  In process, will continue to follow  If discussed at Long Length of Stay Meetings, dates discussed:    Additional Comments:  Marshell Garfinkel, RN 09/14/2016, 10:05 AM

## 2016-09-14 NOTE — Progress Notes (Signed)
Pre hd info 

## 2016-09-14 NOTE — Progress Notes (Signed)
Pre hd assessment  

## 2016-09-15 DIAGNOSIS — Z515 Encounter for palliative care: Secondary | ICD-10-CM

## 2016-09-15 LAB — CULTURE, BLOOD (ROUTINE X 2)
CULTURE: NO GROWTH
CULTURE: NO GROWTH

## 2016-09-15 LAB — GLUCOSE, CAPILLARY
GLUCOSE-CAPILLARY: 160 mg/dL — AB (ref 65–99)
GLUCOSE-CAPILLARY: 206 mg/dL — AB (ref 65–99)
Glucose-Capillary: 194 mg/dL — ABNORMAL HIGH (ref 65–99)
Glucose-Capillary: 164 mg/dL — ABNORMAL HIGH (ref 65–99)

## 2016-09-15 MED ORDER — HYDROCORTISONE NA SUCCINATE PF 100 MG IJ SOLR: 50.0000 mg | Freq: Every day | INTRAMUSCULAR | Status: DC

## 2016-09-15 MED ORDER — METOPROLOL TARTRATE 25 MG PO TABS
12.5000 mg | ORAL_TABLET | Freq: Two times a day (BID) | ORAL | Status: DC
  Administered 2016-09-15 – 2016-09-16 (×2): 12.5 mg via ORAL
  Filled 2016-09-15 (×2): qty 1

## 2016-09-15 MED ORDER — OXYCODONE HCL 5 MG PO TABS
5.0000 mg | ORAL_TABLET | Freq: Three times a day (TID) | ORAL | Status: DC | PRN
  Administered 2016-09-15 – 2016-09-16 (×2): 5 mg via ORAL
  Filled 2016-09-15 (×2): qty 1

## 2016-09-15 MED ORDER — QUETIAPINE FUMARATE 25 MG PO TABS
12.5000 mg | ORAL_TABLET | Freq: Every day | ORAL | Status: DC
  Administered 2016-09-15: 12.5 mg via ORAL
  Filled 2016-09-15: qty 1

## 2016-09-15 MED ORDER — SEVELAMER CARBONATE 0.8 G PO PACK
1.6000 g | PACK | Freq: Three times a day (TID) | ORAL | Status: DC
  Administered 2016-09-15 – 2016-09-21 (×16): 1.6 g via ORAL
  Filled 2016-09-15 (×6): qty 2
  Filled 2016-09-15: qty 1
  Filled 2016-09-15 (×8): qty 2

## 2016-09-15 MED ORDER — SEVELAMER CARBONATE 0.8 G PO PACK
0.8000 g | PACK | Freq: Two times a day (BID) | ORAL | Status: DC
  Administered 2016-09-15 – 2016-09-21 (×5): 0.8 g via ORAL
  Filled 2016-09-15 (×8): qty 1

## 2016-09-15 NOTE — Progress Notes (Signed)
Speech Language Pathology Treatment: Dysphagia  Patient Details Name: [name] MRN: [id number] DOB: Feb 21, 1934 Today's Date: 09/15/2016 Time: 0900-0930 SLP Time Calculation (min) (ACUTE ONLY): 30 min  Assessment / Plan / Recommendation Clinical Impression  Pt was very lethargic and drowsy when Speech Therapy services entered. Family was stating the medication given during the night was causing her to be "so sleepy" in the mornings. Pt was able to be woken appropriately by ST services and was repositioned in the bed. Pt did not appear in any pain and agreed that she was comfortable with her positioning in the bed. Pt originally refused PO trials, but upon second attempt began to eat breakfast meal. Pt appeared to adequately tolerate PO trials of thin liquids, given via straw, puree, and soft solids with no immediate overt s/s of aspiration. Pt appears at a reduced risk for aspiration following strict aspiration precautions. Pt was moderately distractible and required moderate verbal/tactile cues to attend to task. Recommend Dysphagia 3 diet with thin liquids, however a Dysphagia 2 diet may be appropriate in the future if it would assist in supporting nutritional intake. Pt exhibited belching after each trial of thin liquid, unsure if due to irritation from recent intubation or swallowing excess air with liquid. Belching did not increase in intensity or frequency following thin liquids, and Pt did not appear in any discomfort. ST services educated Husband and Nursing on the importance of allowing her breaks between small sips of liquid to allow the air to escape from her esophagus. Due to Pt's drowsy appearance when ST services entered and distractibility during the tx session it was recommended to Nursing to give meds crushed in applesauce. Nursing agreed. ST services to follow up with diet toleration as needed during Pt's admittance.        HPI HPI: Pt is a 82 y.o. female with a known history of  HTN, atrial fibrillation, CHF, diabetes, end-stage renal disease on hemodialysis Monday, Wednesday, Friday was in a usual state of health until this evening when she fell at home.  Patient states that she was standing from her wheelchair doing physical therapy when she became weak, slid to the ground to a seated position. She states that she felt her knees buckle, felt a pop in her knees. Patient is currently wearing a mobility boot to the left lower extremity from her fracture several months ago area and aside from minimal daily physical therapy she has been wheelchair bound in her home. Otherwise there has been no change in status. Patient has been taking medication as prescribed and there has been no recent change in medication or diet.  There has been no recent illness, travel or sick contacts. Pt currently presents with R knee fracture of existing prosthesis, with existing ESRD on HD, now with right pneumonia with septic shock, acute systolic CHF. Pt's pulmonary status declined significantly enough to require bipap, but has now weaned. Due to Pt's medical prognosis Pt is now DNR, family considering change to comfort measures only. Pt has recently been NPO due to medical status and need for bipap.       SLP Plan  Continue with current plan of care     Recommendations  Diet recommendations: Dysphagia 3 (mechanical soft);Thin liquid Liquids provided via: Cup;Straw Medication Administration: Crushed with puree Supervision: Full supervision/cueing for compensatory strategies Compensations: Minimize environmental distractions;Slow rate;Small sips/bites;Follow solids with liquid Postural Changes and/or Swallow Maneuvers: Seated upright 90 degrees;Upright 30-60 min after meal  General recommendations:  (Dietician Consult) Oral Care Recommendations: Oral care BID;Staff/trained caregiver to provide oral care Follow up Recommendations: None Plan: Continue with current plan of  care       [place], B.A. Clinical Graduate Student 09/15/2016, 11:48 AM

## 2016-09-15 NOTE — Progress Notes (Signed)
Patient confused.Nursing interventions taking. Family refused use of mits. Upon arrival to room RIJ removed. Mits on bedside table No active bleeding noted. Dressing intact. Dr. Jannifer Franklin aware no new orders giving.

## 2016-09-15 NOTE — Progress Notes (Signed)
Chaplain rounding the floor visited patient who appeared to be calm and resting. Chaplain asked patient if there was anything patient needs, patient said prayers would be sufficient. Chaplain prayed for healing of the body and soul. Patient was very appreciate of the support and presence of chaplain.     09/15/16 1600  Clinical Encounter Type  Visited With Patient  Visit Type Spiritual support  Spiritual Encounters  Spiritual Needs Prayer

## 2016-09-15 NOTE — Progress Notes (Signed)
Pateint removed RIJ. Pressure held and dressing applied. Prime doc paged.

## 2016-09-15 NOTE — Progress Notes (Signed)
Patient ID: [name], female   DOB: 01-Mar-1934, 82 y.o.   MRN: [id number]    [facility] PROGRESS NOTE  [name] [id number] DOB: [birth year] DOA: 09/09/2016 PCP: Rusty Aus, MD  HPI/Subjective: Patient states that she doesn't feel well but cannot elaborate. Periods of when she staring in space. She received morphine last night for pain.  Objective: Vitals:   09/15/16 0420 09/15/16 0749  BP: 115/63 (!) 102/55  Pulse: (!) 118 (!) 120  Resp: 16 20  Temp: 97.5 F (36.4 C) 98.6 F (37 C)    Filed Weights   09/13/16 0444 09/14/16 1515 09/14/16 1827  Weight: 100.7 kg (222 lb 0.1 oz) 103.5 kg (228 lb 2.8 oz) 94.5 kg (208 lb 5.4 oz)    ROS: Review of Systems  Unable to perform ROS: Critical illness   Exam: Physical Exam  Constitutional: She appears lethargic.  HENT:  Nose: No mucosal edema.  Mouth/Throat: No oropharyngeal exudate or posterior oropharyngeal edema.  Eyes: Lids are normal. Pupils are equal, round, and reactive to light.  Neck: No thyroid mass and no thyromegaly present.  Cardiovascular: An irregularly irregular rhythm present.  Murmur heard.  Systolic murmur is present with a grade of 2/6  Respiratory: She has decreased breath sounds in the right lower field and the left lower field. She has no wheezes. She has rhonchi in the right lower field and the left lower field.  GI: Soft. Bowel sounds are normal. There is no tenderness.  Musculoskeletal:       Right ankle: She exhibits swelling.       Left ankle: She exhibits swelling.  Lymphadenopathy:    She has no cervical adenopathy.  Neurological: She appears lethargic.  Skin: Skin is warm.  Bruising bilateral lower extremities  Psychiatric: Her affect is blunt. Her speech is delayed.      Data Reviewed: Basic Metabolic Panel:  Recent Labs Lab 09/09/16 1754 09/10/16 0029 09/10/16 [id number] 09/10/16 1005 09/10/16 1350 09/10/16 1741 09/11/16 0212 09/12/16 0504 09/12/16 1706  09/13/16 0415  NA 125*  --  125*  --   --   --  129* 127*  --  134*  K 5.6*  --  6.4*  --  2.6* 3.6 4.1 5.3*  --  3.7  CL 95*  --  97*  --   --   --  93* 92*  --  95*  CO2 23  --  14*  --   --   --  23 22  --  29  GLUCOSE 153*  --  214*  --   --   --  98 190*  --  161*  BUN 41*  --  45*  --   --   --  26* 46*  --  29*  CREATININE 4.11*  --  4.31*  --   --   --  3.21* 4.39*  --  2.82*  CALCIUM 8.8*  --  8.8*  --   --   --  8.9 8.6*  --  8.4*  MG  --  1.8  --   --   --   --  1.8  --   --  1.8  PHOS  --  4.1  --  5.7*  --   --  4.1  --  6.4* 4.2   Liver Function Tests:  Recent Labs Lab 09/09/16 1754 09/10/16 0623  AST 40 37  ALT 13* 14  ALKPHOS 260* 245*  BILITOT 1.9* 2.2*  PROT 7.1 7.2  ALBUMIN 3.2* 3.2*   CBC:  Recent Labs Lab 09/10/16 0623 09/11/16 0212 09/12/16 0504 09/13/16 0415 09/14/16 0508  WBC 16.2* 21.8* 17.1* 13.5* 14.0*  NEUTROABS  --  17.5* 14.5*  --   --   HGB 12.2 11.1* 10.5* 10.3* 10.6*  HCT 38.3 33.8* 31.8* 30.4* 31.6*  MCV 104.9* 100.2* 100.8* 99.8 101.0*  PLT 148* 148* 132* 112* 108*   Cardiac Enzymes:  Recent Labs Lab 09/09/16 1754 09/10/16 0029 09/10/16 0623 09/10/16 1145  TROPONINI 0.03* 0.03* 0.07* 0.20*    CBG:  Recent Labs Lab 09/14/16 1129 09/14/16 1852 09/14/16 2106 09/15/16 0753 09/15/16 1142  GLUCAP 175* 110* 100* 160* 194*    Recent Results (from the past 240 hour(s))  MRSA PCR Screening     Status: Abnormal   Collection Time: 09/10/16  1:47 AM  Result Value Ref Range Status   MRSA by PCR POSITIVE (A) NEGATIVE Final    Comment:        The GeneXpert MRSA Assay (FDA approved for NASAL specimens only), is one component of a comprehensive MRSA colonization surveillance program. It is not intended to diagnose MRSA infection nor to guide or monitor treatment for MRSA infections. RESULT CALLED TO, READ BACK BY AND VERIFIED WITH: MONIQUE ROBERSON ON 09/10/16 AT 0359 BY TLB   CULTURE, BLOOD (ROUTINE X 2) w Reflex to  ID Panel     Status: None   Collection Time: 09/10/16  8:19 AM  Result Value Ref Range Status   Specimen Description BLOOD RIGHT HAND  Final   Special Requests BOTTLES DRAWN AEROBIC AND ANAEROBIC .5ML  Final   Culture NO GROWTH 5 DAYS  Final   Report Status 09/15/2016 FINAL  Final  CULTURE, BLOOD (ROUTINE X 2) w Reflex to ID Panel     Status: None   Collection Time: 09/10/16  6:04 PM  Result Value Ref Range Status   Specimen Description BLOOD RIGHT HAND  Final   Special Requests BOTTLES DRAWN AEROBIC AND ANAEROBIC .5ML  Final   Culture NO GROWTH 5 DAYS  Final   Report Status 09/15/2016 FINAL  Final     Studies: No results found.  Scheduled Meds: . allopurinol  100 mg Oral Daily  . azithromycin  500 mg Intravenous Q24H  . ceFEPime (MAXIPIME) IV  1 g Intravenous Q24H  . Chlorhexidine Gluconate Cloth  6 each Topical Q0600  . cholecalciferol  1,000 Units Oral Daily  . docusate sodium  100 mg Oral BID  . fluticasone  2 spray Each Nare QHS  . heparin subcutaneous  5,000 Units Subcutaneous Q8H  . [START ON 09/16/2016] hydrocortisone sod succinate (SOLU-CORTEF) inj  50 mg Intravenous Daily  . insulin aspart  0-20 Units Subcutaneous TID WC  . insulin aspart  0-5 Units Subcutaneous QHS  . mouth rinse  15 mL Mouth Rinse q12n4p  . metoprolol tartrate  12.5 mg Oral BID  . midodrine  5 mg Oral TID WC  . mupirocin ointment  1 application Nasal BID  . pantoprazole  40 mg Oral QAC breakfast  . polyethylene glycol  17 g Oral Daily  . QUEtiapine  12.5 mg Oral QHS  . sevelamer carbonate  0.8 g Oral BID BM  . sevelamer carbonate  1.6 g Oral TID WC  . sodium chloride flush  10-40 mL Intracatheter Q12H  . sodium chloride flush  3 mL Intravenous Q12H  . sodium chloride flush  3 mL Intravenous Q12H  . vancomycin  1,000  mg Intravenous Q M,W,F-HD    Assessment/Plan:  1. Septic shock with multiorgan failure. On vancomycin, cefepime and Zithromax. Patient made a DO NOT RESUSCITATE.   2. Acute  hypoxic respiratory failure. Patient is breathing comfortably on room air. Required BiPAP and was close to being intubated while in the ICU 3. Acute metabolic encephalopathy secondary to sepsis. Patient's mental status is a little cloudy today stop IV morphine. Give Seroquel tonight. 4. Right hip fracture. Orthopedic will not operate. Overall prognosis poor. Try to convert to Tylenol as quickly as possible. Oral oxycodone 5. End-stage renal disease on hemodialysis. Dialysis as per nephrology. Hyperkalemia and hyponatremia during hospital course. 6. Type 2 diabetes on sliding scale 7. History of atrial fibrillation 8. History of congestive heart failure. Dialysis to remove fluid 9. History of gout on allopurinol  Code Status:     Code Status Orders        Start     Ordered   09/11/16 1213  Do not attempt resuscitation (DNR)  Continuous    Question Answer Comment  In the event of cardiac or respiratory ARREST Do not call a "code blue"   In the event of cardiac or respiratory ARREST Do not perform Intubation, CPR, defibrillation or ACLS   In the event of cardiac or respiratory ARREST Use medication by any route, position, wound care, and other measures to relive pain and suffering. May use oxygen, suction and manual treatment of airway obstruction as needed for comfort.      09/11/16 1212    Code Status History    Date Active Date Inactive Code Status Order ID Comments User Context   09/10/2016 12:48 AM 09/10/2016  2:44 PM Full Code [id number]  Harvie Bridge, DO Inpatient   04/06/2016  4:59 PM 04/10/2016  7:57 PM Full Code [id number]  Epifanio Lesches, MD ED   11/20/2015  3:03 PM 11/20/2015  6:10 PM Full Code [id number]  Algernon Huxley, MD Inpatient   08/14/2015  5:40 AM 08/21/2015 10:45 PM Full Code [id number]  Juluis Mire, MD Inpatient   08/02/2015  1:44 PM 08/11/2015 10:21 PM Full Code [id number]  Gladstone Lighter, MD Inpatient     Family Communication: Case discussed with Husband at the  bedside Disposition Plan: Husband would like to take home at some point  Consultants:  Critical care specialist  Nephrology  Orthopedic surgery  Antibiotics:  Cefepime  Zithromax  Vancomycin  Time spent: 24 minutes  Loletha Grayer  [employer]

## 2016-09-15 NOTE — Progress Notes (Signed)
[facility]  ROUNDING NOTE   Subjective:   Hemodialysis yesterday. Tolerated treatment well. UF of 77m. IV albumin and midodrine administered.   Patient met with palliative care yesterday. States she continues to want dialysis.    Objective:  Vital signs in last 24 hours:  Temp:  [97.5 F (36.4 C)-98.9 F (37.2 C)] 98.6 F (37 C) (10/04 0749) Pulse Rate:  [98-120] 120 (10/04 0749) Resp:  [10-29] 20 (10/04 0749) BP: (79-121)/(45-88) 102/55 (10/04 0749) SpO2:  [80 %-100 %] 98 % (10/04 0749) Weight:  [94.5 kg (208 lb 5.4 oz)-103.5 kg (228 lb 2.8 oz)] 94.5 kg (208 lb 5.4 oz) (10/03 1827)  Weight change:  Filed Weights   09/13/16 0444 09/14/16 1515 09/14/16 1827  Weight: 100.7 kg (222 lb 0.1 oz) 103.5 kg (228 lb 2.8 oz) 94.5 kg (208 lb 5.4 oz)    Intake/Output: I/O last 3 completed shifts: In: 45 [I.V.:45] Out: 750 [Other:750]   Intake/Output this shift:  No intake/output data recorded.  Physical Exam: General: ill appearing.   Head: Normocephalic, atraumatic  Eyes: Anicteric  Neck: Supple, trachea midline  Lungs:  Crackles, CPAP  Heart: Tachycardia, irregular  Abdomen:  Soft, nontender, BS present   Extremities: Right leg in an immobilizer, no peripheral edema  Neurologic: Alert and oriented.   Skin: No lesions  Access: LUE AVF    Basic Metabolic Panel:  Recent Labs Lab 09/09/16 1754 09/10/16 0029 09/10/16 0623 09/10/16 1005 09/10/16 1350 09/10/16 1741 09/11/16 0212 09/12/16 0504 09/12/16 1706 09/13/16 0415  NA 125*  --  125*  --   --   --  129* 127*  --  134*  K 5.6*  --  6.4*  --  2.6* 3.6 4.1 5.3*  --  3.7  CL 95*  --  97*  --   --   --  93* 92*  --  95*  CO2 23  --  14*  --   --   --  23 22  --  29  GLUCOSE 153*  --  214*  --   --   --  98 190*  --  161*  BUN 41*  --  45*  --   --   --  26* 46*  --  29*  CREATININE 4.11*  --  4.31*  --   --   --  3.21* 4.39*  --  2.82*  CALCIUM 8.8*  --  8.8*  --   --   --  8.9 8.6*  --  8.4*  MG   --  1.8  --   --   --   --  1.8  --   --  1.8  PHOS  --  4.1  --  5.7*  --   --  4.1  --  6.4* 4.2    Liver Function Tests:  Recent Labs Lab 09/09/16 1754 09/10/16 0623  AST 40 37  ALT 13* 14  ALKPHOS 260* 245*  BILITOT 1.9* 2.2*  PROT 7.1 7.2  ALBUMIN 3.2* 3.2*   No results for input(s): LIPASE, AMYLASE in the last 168 hours. No results for input(s): AMMONIA in the last 168 hours.  CBC:  Recent Labs Lab 09/10/16 0623 09/11/16 0212 09/12/16 0504 09/13/16 0415 09/14/16 0508  WBC 16.2* 21.8* 17.1* 13.5* 14.0*  NEUTROABS  --  17.5* 14.5*  --   --   HGB 12.2 11.1* 10.5* 10.3* 10.6*  HCT 38.3 33.8* 31.8* 30.4* 31.6*  MCV 104.9* 100.2* 100.8* 99.8  101.0*  PLT 148* 148* 132* 112* 108*    Cardiac Enzymes:  Recent Labs Lab 09/09/16 1754 09/10/16 0029 09/10/16 0623 09/10/16 1145  TROPONINI 0.03* 0.03* 0.07* 0.20*    BNP: Invalid input(s): POCBNP  CBG:  Recent Labs Lab 09/14/16 0728 09/14/16 1129 09/14/16 1852 09/14/16 2106 09/15/16 0753  GLUCAP 158* 175* 110* 100* 160*    Microbiology: Results for orders placed or performed during the hospital encounter of 09/09/16  MRSA PCR Screening     Status: Abnormal   Collection Time: 09/10/16  1:47 AM  Result Value Ref Range Status   MRSA by PCR POSITIVE (A) NEGATIVE Final    Comment:        The GeneXpert MRSA Assay (FDA approved for NASAL specimens only), is one component of a comprehensive MRSA colonization surveillance program. It is not intended to diagnose MRSA infection nor to guide or monitor treatment for MRSA infections. RESULT CALLED TO, READ BACK BY AND VERIFIED WITH: MONIQUE ROBERSON ON 09/10/16 AT 0359 BY TLB   CULTURE, BLOOD (ROUTINE X 2) w Reflex to ID Panel     Status: None   Collection Time: 09/10/16  8:19 AM  Result Value Ref Range Status   Specimen Description BLOOD RIGHT HAND  Final   Special Requests BOTTLES DRAWN AEROBIC AND ANAEROBIC .5ML  Final   Culture NO GROWTH 5 DAYS   Final   Report Status 09/15/2016 FINAL  Final  CULTURE, BLOOD (ROUTINE X 2) w Reflex to ID Panel     Status: None   Collection Time: 09/10/16  6:04 PM  Result Value Ref Range Status   Specimen Description BLOOD RIGHT HAND  Final   Special Requests BOTTLES DRAWN AEROBIC AND ANAEROBIC .5ML  Final   Culture NO GROWTH 5 DAYS  Final   Report Status 09/15/2016 FINAL  Final    Coagulation Studies: No results for input(s): LABPROT, INR in the last 72 hours.  Urinalysis: No results for input(s): COLORURINE, LABSPEC, PHURINE, GLUCOSEU, HGBUR, BILIRUBINUR, KETONESUR, PROTEINUR, UROBILINOGEN, NITRITE, LEUKOCYTESUR in the last 72 hours.  Invalid input(s): APPERANCEUR    Imaging: No results found.   Medications:     . allopurinol  100 mg Oral Daily  . azithromycin  500 mg Intravenous Q24H  . ceFEPime (MAXIPIME) IV  1 g Intravenous Q24H  . Chlorhexidine Gluconate Cloth  6 each Topical Q0600  . cholecalciferol  1,000 Units Oral Daily  . docusate sodium  100 mg Oral BID  . fluticasone  2 spray Each Nare QHS  . heparin subcutaneous  5,000 Units Subcutaneous Q8H  . [START ON 09/16/2016] hydrocortisone sod succinate (SOLU-CORTEF) inj  50 mg Intravenous Daily  . insulin aspart  0-20 Units Subcutaneous TID WC  . insulin aspart  0-5 Units Subcutaneous QHS  . mouth rinse  15 mL Mouth Rinse q12n4p  . metoprolol tartrate  12.5 mg Oral BID  . midodrine  5 mg Oral TID WC  . mupirocin ointment  1 application Nasal BID  . pantoprazole  40 mg Oral QAC breakfast  . polyethylene glycol  17 g Oral Daily  . QUEtiapine  12.5 mg Oral QHS  . sevelamer carbonate  0.8 g Oral BID BM  . sevelamer carbonate  1.6 g Oral TID WC  . sodium chloride flush  10-40 mL Intracatheter Q12H  . sodium chloride flush  3 mL Intravenous Q12H  . sodium chloride flush  3 mL Intravenous Q12H  . vancomycin  1,000 mg Intravenous Q M,W,F-HD   sodium  chloride, acetaminophen **OR** acetaminophen, guaiFENesin-dextromethorphan,  ipratropium-albuterol, morphine injection, nystatin cream, ondansetron **OR** ondansetron (ZOFRAN) IV, senna, sodium chloride flush, sodium chloride flush  Assessment/ Plan:  82 y.o. white female with end-stage renal disease on hemodialysis, diabetes mellitus type 2, hypertension, obstructive sleep apnea, atrial fibrillation, arthritis, anemia admitted on 09/09/2016 for Hyperkalemia [E87.5] Hyponatremia [E87.1] Weakness [R53.1] Hypoxia [R09.02] Closed nondisplaced fracture of lateral condyle of right femur, initial encounter ([place]) [S72.424A]   MWF [facility]  1.  End-stage renal disease with hyperkalemia: hemodialysis yesterday. IV Albumin and midodrine with treatment. UF goal of 1.5 litres.  - monitor daily for dailysis need. - Low potassium diet.  - Will try to get back on MWF schedule later this week.   2.  Hypotension:  - midodrine  - IV albumin with HD treatment  3.  Anemia of chronic kidney disease - EPO with dialysis  4.  Secondary hyperparathyroidism:  Phosphorus and calcium at goal.  -  sevelamer with meals.    LOS: [place], [place] 10/4/201711:01 AM

## 2016-09-15 NOTE — Care Management (Signed)
Patient and family has declined palliative services on this date. Patient is still on CPAP which she has at home. She no longer has home O2 though. Husband plans to take patient home at discharge.  EMS packet started.

## 2016-09-15 NOTE — Progress Notes (Signed)
  This NP spoke with both the patient's husband and her son Patrick [place] recommending family meeting to discuss [place].  Both tell me they are not interested in having "anymore" discussions at this time.  Both verbalize they are open to all offered and avialable medical interventions to prolong life.  I left my contact information and encouraged them to call with questions or concerns.  Wadie Lessen NP  Palliative Medicine Team Team Phone # [phone] Pager [phone]

## 2016-09-16 LAB — VANCOMYCIN, RANDOM: Vancomycin Rm: 24

## 2016-09-16 LAB — GLUCOSE, CAPILLARY
GLUCOSE-CAPILLARY: 132 mg/dL — AB (ref 65–99)
Glucose-Capillary: 182 mg/dL — ABNORMAL HIGH (ref 65–99)
Glucose-Capillary: 134 mg/dL — ABNORMAL HIGH (ref 65–99)
Glucose-Capillary: 156 mg/dL — ABNORMAL HIGH (ref 65–99)

## 2016-09-16 MED ORDER — ALBUMIN HUMAN 25 % IV SOLN
12.5000 g | Freq: Once | INTRAVENOUS | Status: AC
  Administered 2016-09-16: 12.5 g via INTRAVENOUS
  Filled 2016-09-16: qty 50

## 2016-09-16 MED ORDER — LEVOFLOXACIN 750 MG PO TABS
750.0000 mg | ORAL_TABLET | Freq: Once | ORAL | Status: AC
  Administered 2016-09-16: 750 mg via ORAL
  Filled 2016-09-16: qty 1

## 2016-09-16 MED ORDER — HYDROCORTISONE NA SUCCINATE PF 100 MG IJ SOLR: 25.0000 mg | Freq: Every day | INTRAMUSCULAR | Status: DC

## 2016-09-16 MED ORDER — LEVOFLOXACIN 500 MG PO TABS: 500.0000 mg | ORAL_TABLET | ORAL | Status: DC

## 2016-09-16 MED ORDER — AMOXICILLIN-POT CLAVULANATE 500-125 MG PO TABS: 1.0000 | ORAL_TABLET | Freq: Every day | ORAL | Status: DC

## 2016-09-16 MED ORDER — VANCOMYCIN HCL IN DEXTROSE 1-5 GM/200ML-% IV SOLN
1000.0000 mg | Freq: Once | INTRAVENOUS | Status: AC
  Administered 2016-09-16: 1000 mg via INTRAVENOUS
  Filled 2016-09-16: qty 200

## 2016-09-16 MED ORDER — EPOETIN ALFA 10000 UNIT/ML IJ SOLN
10000.0000 [IU] | Freq: Once | INTRAMUSCULAR | Status: AC
  Administered 2016-09-16: 10000 [IU] via INTRAVENOUS
  Filled 2016-09-16: qty 1

## 2016-09-16 MED ORDER — QUETIAPINE FUMARATE 25 MG PO TABS
25.0000 mg | ORAL_TABLET | Freq: Every day | ORAL | Status: DC
  Administered 2016-09-16 – 2016-09-20 (×5): 25 mg via ORAL
  Filled 2016-09-16 (×5): qty 1

## 2016-09-16 MED ORDER — HALOPERIDOL 1 MG PO TABS
1.0000 mg | ORAL_TABLET | Freq: Four times a day (QID) | ORAL | Status: DC | PRN
  Administered 2016-09-16 – 2016-09-17 (×4): 1 mg via ORAL
  Filled 2016-09-16 (×4): qty 1

## 2016-09-16 NOTE — Progress Notes (Signed)
Tx complete  

## 2016-09-16 NOTE — Care Management Important Message (Signed)
Important Message  Patient Details  Name: [name] MRN: [id number] Date of Birth: Apr 30, 1934   Medicare Important Message Given:  Yes    Marshell Garfinkel, RN 09/16/2016, 7:49 AM

## 2016-09-16 NOTE — Progress Notes (Signed)
[facility]  ROUNDING NOTE   Subjective:   Family at bedside.  Weakness and confusion.   Dialysis for later today   Objective:  Vital signs in last 24 hours:  Temp:  [97.8 F (36.6 C)-99.5 F (37.5 C)] 97.8 F (36.6 C) (10/05 0728) Pulse Rate:  [101-117] 101 (10/05 0728) Resp:  [18-19] 19 (10/05 0406) BP: (99-121)/(48-60) 121/58 (10/05 0728) SpO2:  [96 %-100 %] 100 % (10/05 0728)  Weight change:  Filed Weights   09/13/16 0444 09/14/16 1515 09/14/16 1827  Weight: 100.7 kg (222 lb 0.1 oz) 103.5 kg (228 lb 2.8 oz) 94.5 kg (208 lb 5.4 oz)    Intake/Output: I/O last 3 completed shifts: In: [id number] [P.O.:240; I.V.:16; IV Piggyback:400] Out: -    Intake/Output this shift:  No intake/output data recorded.  Physical Exam: General: ill appearing.   Head: Normocephalic, atraumatic  Eyes: Anicteric  Neck: Supple, trachea midline  Lungs:  Crackles, CPAP  Heart: Tachycardia, irregular  Abdomen:  Soft, nontender, BS present   Extremities: Right leg in an immobilizer, no peripheral edema  Neurologic: Alert and oriented.   Skin: No lesions  Access: LUE AVF    Basic Metabolic Panel:  Recent Labs Lab 09/09/16 1754 09/10/16 0029 09/10/16 0623 09/10/16 1005 09/10/16 1350 09/10/16 1741 09/11/16 0212 09/12/16 0504 09/12/16 1706 09/13/16 0415  NA 125*  --  125*  --   --   --  129* 127*  --  134*  K 5.6*  --  6.4*  --  2.6* 3.6 4.1 5.3*  --  3.7  CL 95*  --  97*  --   --   --  93* 92*  --  95*  CO2 23  --  14*  --   --   --  23 22  --  29  GLUCOSE 153*  --  214*  --   --   --  98 190*  --  161*  BUN 41*  --  45*  --   --   --  26* 46*  --  29*  CREATININE 4.11*  --  4.31*  --   --   --  3.21* 4.39*  --  2.82*  CALCIUM 8.8*  --  8.8*  --   --   --  8.9 8.6*  --  8.4*  MG  --  1.8  --   --   --   --  1.8  --   --  1.8  PHOS  --  4.1  --  5.7*  --   --  4.1  --  6.4* 4.2    Liver Function Tests:  Recent Labs Lab 09/09/16 1754 09/10/16 0623  AST 40 37   ALT 13* 14  ALKPHOS 260* 245*  BILITOT 1.9* 2.2*  PROT 7.1 7.2  ALBUMIN 3.2* 3.2*   No results for input(s): LIPASE, AMYLASE in the last 168 hours. No results for input(s): AMMONIA in the last 168 hours.  CBC:  Recent Labs Lab 09/10/16 0623 09/11/16 0212 09/12/16 0504 09/13/16 0415 09/14/16 0508  WBC 16.2* 21.8* 17.1* 13.5* 14.0*  NEUTROABS  --  17.5* 14.5*  --   --   HGB 12.2 11.1* 10.5* 10.3* 10.6*  HCT 38.3 33.8* 31.8* 30.4* 31.6*  MCV 104.9* 100.2* 100.8* 99.8 101.0*  PLT 148* 148* 132* 112* 108*    Cardiac Enzymes:  Recent Labs Lab 09/09/16 1754 09/10/16 0029 09/10/16 0623 09/10/16 1145  TROPONINI 0.03* 0.03* 0.07* 0.20*  BNP: Invalid input(s): POCBNP  CBG:  Recent Labs Lab 09/15/16 0753 09/15/16 1142 09/15/16 1542 09/15/16 2129 09/16/16 0727  GLUCAP 160* 194* 164* 206* 132*    Microbiology: Results for orders placed or performed during the hospital encounter of 09/09/16  MRSA PCR Screening     Status: Abnormal   Collection Time: 09/10/16  1:47 AM  Result Value Ref Range Status   MRSA by PCR POSITIVE (A) NEGATIVE Final    Comment:        The GeneXpert MRSA Assay (FDA approved for NASAL specimens only), is one component of a comprehensive MRSA colonization surveillance program. It is not intended to diagnose MRSA infection nor to guide or monitor treatment for MRSA infections. RESULT CALLED TO, READ BACK BY AND VERIFIED WITH: MONIQUE ROBERSON ON 09/10/16 AT 0359 BY TLB   CULTURE, BLOOD (ROUTINE X 2) w Reflex to ID Panel     Status: None   Collection Time: 09/10/16  8:19 AM  Result Value Ref Range Status   Specimen Description BLOOD RIGHT HAND  Final   Special Requests BOTTLES DRAWN AEROBIC AND ANAEROBIC .5ML  Final   Culture NO GROWTH 5 DAYS  Final   Report Status 09/15/2016 FINAL  Final  CULTURE, BLOOD (ROUTINE X 2) w Reflex to ID Panel     Status: None   Collection Time: 09/10/16  6:04 PM  Result Value Ref Range Status    Specimen Description BLOOD RIGHT HAND  Final   Special Requests BOTTLES DRAWN AEROBIC AND ANAEROBIC .5ML  Final   Culture NO GROWTH 5 DAYS  Final   Report Status 09/15/2016 FINAL  Final    Coagulation Studies: No results for input(s): LABPROT, INR in the last 72 hours.  Urinalysis: No results for input(s): COLORURINE, LABSPEC, PHURINE, GLUCOSEU, HGBUR, BILIRUBINUR, KETONESUR, PROTEINUR, UROBILINOGEN, NITRITE, LEUKOCYTESUR in the last 72 hours.  Invalid input(s): APPERANCEUR    Imaging: No results found.   Medications:     . allopurinol  100 mg Oral Daily  . Chlorhexidine Gluconate Cloth  6 each Topical Q0600  . cholecalciferol  1,000 Units Oral Daily  . fluticasone  2 spray Each Nare QHS  . heparin subcutaneous  5,000 Units Subcutaneous Q8H  . insulin aspart  0-20 Units Subcutaneous TID WC  . insulin aspart  0-5 Units Subcutaneous QHS  . [START ON 09/18/2016] levofloxacin  500 mg Oral Q48H  . levofloxacin  750 mg Oral Once  . mouth rinse  15 mL Mouth Rinse q12n4p  . metoprolol tartrate  12.5 mg Oral BID  . midodrine  5 mg Oral TID WC  . mupirocin ointment  1 application Nasal BID  . pantoprazole  40 mg Oral QAC breakfast  . polyethylene glycol  17 g Oral Daily  . QUEtiapine  25 mg Oral QHS  . sevelamer carbonate  0.8 g Oral BID BM  . sevelamer carbonate  1.6 g Oral TID WC  . sodium chloride flush  10-40 mL Intracatheter Q12H  . sodium chloride flush  3 mL Intravenous Q12H  . sodium chloride flush  3 mL Intravenous Q12H  . vancomycin  1,000 mg Intravenous Q M,W,F-HD   sodium chloride, acetaminophen **OR** acetaminophen, haloperidol, ipratropium-albuterol, nystatin cream, ondansetron **OR** ondansetron (ZOFRAN) IV, senna, sodium chloride flush, sodium chloride flush  Assessment/ Plan:  82 y.o. white female with end-stage renal disease on hemodialysis, diabetes mellitus type 2, hypertension, obstructive sleep apnea, atrial fibrillation, arthritis, anemia admitted on  09/09/2016 for Hyperkalemia [E87.5] Hyponatremia [E87.1] Weakness [R53.1]  Hypoxia [R09.02] Closed nondisplaced fracture of lateral condyle of right femur, initial encounter ([place]) [S72.424A]   MWF [place]  1.  End-stage renal disease with hyperkalemia: hemodialysis yesterday. IV Albumin and midodrine with treatment. UF goal of 1.5-2 litres.  - monitor daily for dailysis need. - Low potassium diet.   2.  Hypotension:  - midodrine  - IV albumin with HD treatment  3.  Anemia of chronic kidney disease - EPO with dialysis  4.  Secondary hyperparathyroidism:  Phosphorus and calcium at goal.  -  sevelamer with meals.    LOS: 7 Batsheva Stevick 10/5/201710:13 AM

## 2016-09-16 NOTE — Progress Notes (Signed)
Pre Dialysis 

## 2016-09-16 NOTE — Progress Notes (Signed)
This writer placed soft immobilizer to pt's R leg. Pt had had her knee bent and needed assistance to straighten it out first, pt cried out in pain when her leg was moved, then calmed once it was straightened. Heels are floated.

## 2016-09-16 NOTE — Progress Notes (Signed)
Post dialysis 

## 2016-09-16 NOTE — Progress Notes (Signed)
Dialysis started 

## 2016-09-16 NOTE — Progress Notes (Signed)
Shift assessment completed at 0730. Pt in bed with eyes closed, husband and family member at bedside. Pt is on cpap, dressing intact to r neck, pt has bounding pulse. Lungs are clear bilat, hr is regular at time of assessment. Abdomen is soft, bs heard. Pink sleeve to L arm is intact and dressing over dialysis catheter is also intact. Pt has mitts on bilat, is continually attempting ot pull these off, at times is successful. PPP, general edema noted. Pt has no iv access. Since assessment completed, pt has removed mitts multiple times, has received oxycodone for pain, pt winces when r leg is touched, and has received po haldol for agitation. Dr. Has rounded and informed family of changes to pt medications today, family is able to verbalize these. Pt was fed and ate all of her breakfast. Dialysis is scheduled for today.

## 2016-09-16 NOTE — Progress Notes (Signed)
Initial Nutrition Assessment  DOCUMENTATION CODES:   Obesity unspecified  INTERVENTION:  Vanilla Magic cup TID with meals, each supplement provides 290 kcal and 9 grams of protein Encourage PO intake  NUTRITION DIAGNOSIS:   Inadequate oral intake related to poor appetite, lethargy/confusion as evidenced by per patient/family report.  GOAL:   Patient will meet greater than or equal to 90% of their needs  MONITOR:   PO intake, I & O's, Labs, Weight trends, Supplement acceptance  REASON FOR ASSESSMENT:   Consult Poor PO  ASSESSMENT:   [name] is a 82 y.o. female with a known history of HTN, atrial fibrillation, CHF, diabetes, end-stage renal disease on hemodialysis Monday, Wednesday, Friday was in a usual state of health until this evening when she fell at home  Spoke with pt's husband at bedside. He states patient ate well for breakfast this morning with oatmeal and yogurt, but did not want to eat lunch. Per RN note pt was agitated today, Husband states she was just starting to calm down. PO intake PTA was good per Husband. He is unsure of pt's wt hx. Per chart review appears she has lost 6# in the past year. She is an ESRD pt on HD, and thus experiences fluid fluctuations and accumulations. Nutrition-Focused physical exam completed. Findings are no fat depletion, no muscle depletion, and mild edema.  She will have dialysis this afternoon. Still lethargic at bedside. Denies nausea/vomiting at this time. Has been seen by SLP for chewing/swallowing concerns. Labs and medications reviewed: CBGs 132-182 Vitamin D, Miralax/Glycolax  Diet Order:  DIET DYS 3 Room service appropriate? Yes with Assist; Fluid consistency: Thin  Skin:  Reviewed, no issues  Last BM:  09/15/2016  Height:   Ht Readings from Last 1 Encounters:  09/10/16 5\' 1"  (1.549 m)    Weight:   Wt Readings from Last 1 Encounters:  09/14/16 208 lb 5.4 oz (94.5 kg)    Ideal Body Weight:  47.72 kg  BMI:   Body mass index is 39.36 kg/m.  Estimated Nutritional Needs:   Kcal:  1600-1900 (25-30 cal/kg SBW)  Protein:  65-80 gm  Fluid:  >/= 1.5L  EDUCATION NEEDS:   No education needs identified at this time  Satira Anis. Didier Brandenburg, MS, RD LDN Inpatient Clinical Dietitian Pager [phone]

## 2016-09-16 NOTE — Progress Notes (Addendum)
Pharmacy Antibiotic Note  [name] is a 82 y.o. female admitted on 09/09/2016 with recent fracture, fall and hypoxia. Pharmacy consulted for cefepime and vancomycin for possible HCAP.  Patient received HD MWF. Cefepime d/c'd 10/5. Levaquin started 10/5.   Plan: Will give Vancomycin 2gm LD, followed by vancomycin 1g IV after each HD on MWF. Plan for trough on 3rd HD day.  Pt is currently off schedule for dialysis and will need to follow dialysis schedule to ensure doses are given. Trough is due prior to next HD. Nephro note today states HD later today. Will order trough for today.   Vanc: 2g 10/1 at 1125 1g 10/2 at 1604 1g 10/3 at 1734   Will order levofloxacin 750 mg PO x1 then 500 mg PO q48h.     Height: 5\' 1"  (154.9 cm) Weight: 208 lb 5.4 oz (94.5 kg) IBW/kg (Calculated) : 47.8  Temp (24hrs), Avg:98.5 F (36.9 C), Min:97.8 F (36.6 C), Max:99.5 F (37.5 C)   Recent Labs Lab 09/09/16 1754 09/10/16 0623 09/10/16 0839 09/10/16 1145 09/11/16 0212 09/12/16 0504 09/13/16 0415 09/14/16 0508  WBC 15.2* 16.2*  --   --  21.8* 17.1* 13.5* 14.0*  CREATININE 4.11* 4.31*  --   --  3.21* 4.39* 2.82*  --   LATICACIDVEN  --   --  5.3* 4.1*  --   --   --   --     Estimated Creatinine Clearance: 16.7 mL/min (by C-G formula based on SCr of 2.82 mg/dL (H)).    Allergies  Allergen Reactions  . Angiotensin Receptor Blockers Other (See Comments)    Reaction:  Hyperkalemia  . Penicillins Rash and Other (See Comments)    rash    Antimicrobials this admission: 9/29 Azithromycin >> 10/4 10/1 cefepime >> 10/4 10/1 Vancomycin >> levaquin 10/5 >>  Dose adjustments this admission:  Microbiology results: 9/29 BCx: NG 9/29 MRSA PCR: positive   Thank you for allowing pharmacy to be a part of this patient's care.  Rayna Sexton, PharmD, BCPS Clinical Pharmacist 09/16/2016 10:16 AM

## 2016-09-16 NOTE — Progress Notes (Signed)
Pt has received medication for pain, haldol, and now tylenol for agitation. Pt is ableto state that she knows not to pick at things, bilat mitts in place, but pt states she just cant help herself. Husband at bedside, pt requiring very frequent redirection by him and staff to keep mitts in place. This writer placed dressing to R fa s pt managed to pick a tear in her skin that bled slightly.

## 2016-09-16 NOTE — Progress Notes (Signed)
Patient ID: [name], female   DOB: 11-24-1934, 81 y.o.   MRN: [id number]    [facility] PROGRESS NOTE  [name] [id number] DOB: 1934-07-04 DOA: 09/09/2016 PCP: Rusty Aus, MD  HPI/Subjective: Patient did not fall sleep until about 4 AM. Has been agitated and picking at things this morning. She states she doesn't feel well but unable to elaborate. She pulled out her central line last night and has no further IV access.  Objective: Vitals:   09/16/16 0406 09/16/16 0728  BP: 107/60 (!) 121/58  Pulse: (!) 104 (!) 101  Resp: 19   Temp: 98.2 F (36.8 C) 97.8 F (36.6 C)    Filed Weights   09/13/16 0444 09/14/16 1515 09/14/16 1827  Weight: 100.7 kg (222 lb 0.1 oz) 103.5 kg (228 lb 2.8 oz) 94.5 kg (208 lb 5.4 oz)    ROS: Review of Systems  Unable to perform ROS: Critical illness   Exam: Physical Exam  Constitutional: She appears lethargic.  HENT:  Nose: No mucosal edema.  Mouth/Throat: No oropharyngeal exudate or posterior oropharyngeal edema.  Eyes: Lids are normal. Pupils are equal, round, and reactive to light.  Neck: No thyroid mass and no thyromegaly present.  Cardiovascular: An irregularly irregular rhythm present.  Murmur heard.  Systolic murmur is present with a grade of 2/6  Respiratory: She has decreased breath sounds in the right lower field and the left lower field. She has no wheezes. She has rhonchi in the right lower field and the left lower field.  GI: Soft. Bowel sounds are normal. There is no tenderness.  Musculoskeletal:       Right ankle: She exhibits swelling.       Left ankle: She exhibits swelling.  Lymphadenopathy:    She has no cervical adenopathy.  Neurological: She appears lethargic.  Patient picking her mittens off and pulling at her bracelet.  Skin: Skin is warm.  Bruising bilateral lower extremities  Psychiatric: She is agitated. She is inattentive.      Data Reviewed: Basic Metabolic Panel:  Recent Labs Lab  09/09/16 1754 09/10/16 0029 09/10/16 [id number] 09/10/16 1005 09/10/16 1350 09/10/16 1741 09/11/16 0212 09/12/16 0504 09/12/16 1706 09/13/16 0415  NA 125*  --  125*  --   --   --  129* 127*  --  134*  K 5.6*  --  6.4*  --  2.6* 3.6 4.1 5.3*  --  3.7  CL 95*  --  97*  --   --   --  93* 92*  --  95*  CO2 23  --  14*  --   --   --  23 22  --  29  GLUCOSE 153*  --  214*  --   --   --  98 190*  --  161*  BUN 41*  --  45*  --   --   --  26* 46*  --  29*  CREATININE 4.11*  --  4.31*  --   --   --  3.21* 4.39*  --  2.82*  CALCIUM 8.8*  --  8.8*  --   --   --  8.9 8.6*  --  8.4*  MG  --  1.8  --   --   --   --  1.8  --   --  1.8  PHOS  --  4.1  --  5.7*  --   --  4.1  --  6.4* 4.2  Liver Function Tests:  Recent Labs Lab 09/09/16 1754 09/10/16 0623  AST 40 37  ALT 13* 14  ALKPHOS 260* 245*  BILITOT 1.9* 2.2*  PROT 7.1 7.2  ALBUMIN 3.2* 3.2*   CBC:  Recent Labs Lab 09/10/16 0623 09/11/16 0212 09/12/16 0504 09/13/16 0415 09/14/16 0508  WBC 16.2* 21.8* 17.1* 13.5* 14.0*  NEUTROABS  --  17.5* 14.5*  --   --   HGB 12.2 11.1* 10.5* 10.3* 10.6*  HCT 38.3 33.8* 31.8* 30.4* 31.6*  MCV 104.9* 100.2* 100.8* 99.8 101.0*  PLT 148* 148* 132* 112* 108*   Cardiac Enzymes:  Recent Labs Lab 09/09/16 1754 09/10/16 0029 09/10/16 0623 09/10/16 1145  TROPONINI 0.03* 0.03* 0.07* 0.20*    CBG:  Recent Labs Lab 09/15/16 1142 09/15/16 1542 09/15/16 2129 09/16/16 0727 09/16/16 1133  GLUCAP 194* 164* 206* 132* 182*    Recent Results (from the past 240 hour(s))  MRSA PCR Screening     Status: Abnormal   Collection Time: 09/10/16  1:47 AM  Result Value Ref Range Status   MRSA by PCR POSITIVE (A) NEGATIVE Final    Comment:        The GeneXpert MRSA Assay (FDA approved for NASAL specimens only), is one component of a comprehensive MRSA colonization surveillance program. It is not intended to diagnose MRSA infection nor to guide or monitor treatment for MRSA  infections. RESULT CALLED TO, READ BACK BY AND VERIFIED WITH: MONIQUE ROBERSON ON 09/10/16 AT 0359 BY TLB   CULTURE, BLOOD (ROUTINE X 2) w Reflex to ID Panel     Status: None   Collection Time: 09/10/16  8:19 AM  Result Value Ref Range Status   Specimen Description BLOOD RIGHT HAND  Final   Special Requests BOTTLES DRAWN AEROBIC AND ANAEROBIC .5ML  Final   Culture NO GROWTH 5 DAYS  Final   Report Status 09/15/2016 FINAL  Final  CULTURE, BLOOD (ROUTINE X 2) w Reflex to ID Panel     Status: None   Collection Time: 09/10/16  6:04 PM  Result Value Ref Range Status   Specimen Description BLOOD RIGHT HAND  Final   Special Requests BOTTLES DRAWN AEROBIC AND ANAEROBIC .5ML  Final   Culture NO GROWTH 5 DAYS  Final   Report Status 09/15/2016 FINAL  Final     Studies: No results found.  Scheduled Meds: . albumin human  12.5 g Intravenous Once  . allopurinol  100 mg Oral Daily  . Chlorhexidine Gluconate Cloth  6 each Topical Q0600  . cholecalciferol  1,000 Units Oral Daily  . epoetin (EPOGEN/PROCRIT) injection  10,000 Units Intravenous Once  . fluticasone  2 spray Each Nare QHS  . heparin subcutaneous  5,000 Units Subcutaneous Q8H  . insulin aspart  0-20 Units Subcutaneous TID WC  . insulin aspart  0-5 Units Subcutaneous QHS  . [START ON 09/18/2016] levofloxacin  500 mg Oral Q48H  . levofloxacin  750 mg Oral Once  . mouth rinse  15 mL Mouth Rinse q12n4p  . midodrine  5 mg Oral TID WC  . mupirocin ointment  1 application Nasal BID  . pantoprazole  40 mg Oral QAC breakfast  . polyethylene glycol  17 g Oral Daily  . QUEtiapine  25 mg Oral QHS  . sevelamer carbonate  0.8 g Oral BID BM  . sevelamer carbonate  1.6 g Oral TID WC  . sodium chloride flush  10-40 mL Intracatheter Q12H  . sodium chloride flush  3 mL Intravenous Q12H  .  sodium chloride flush  3 mL Intravenous Q12H  . vancomycin  1,000 mg Intravenous Q M,W,F-HD    Assessment/Plan:  1. Septic shock with multiorgan failure.   Patient made a DO NOT RESUSCITATE.  Continue vancomycin with dialysis. Switch cefepime over to oral Levaquin. Finished Zithromax course. 2. Acute hypoxic respiratory failure. Patient is now breathing comfortably on room air.  3. Acute metabolic encephalopathy. Delirium in hospital. Haldol prn, seroquel at night. This will need to improve prior to disposition. 4. Right hip fracture. Orthopedic will not operate. Overall prognosis poor. Tylenol for pain. 5. End-stage renal disease on hemodialysis. Dialysis as per nephrology. Hyperkalemia and hyponatremia during hospital course. 6. Type 2 diabetes on sliding scale 7. History of atrial fibrillation 8. History of congestive heart failure. Dialysis to remove fluid 9. History of gout on allopurinol 10. Hypotension on midodrine.  Code Status:     Code Status Orders        Start     Ordered   09/11/16 1213  Do not attempt resuscitation (DNR)  Continuous    Question Answer Comment  In the event of cardiac or respiratory ARREST Do not call a "code blue"   In the event of cardiac or respiratory ARREST Do not perform Intubation, CPR, defibrillation or ACLS   In the event of cardiac or respiratory ARREST Use medication by any route, position, wound care, and other measures to relive pain and suffering. May use oxygen, suction and manual treatment of airway obstruction as needed for comfort.      09/11/16 1212    Code Status History    Date Active Date Inactive Code Status Order ID Comments User Context   09/10/2016 12:48 AM 09/10/2016  2:44 PM Full Code [id number]  Harvie Bridge, DO Inpatient   04/06/2016  4:59 PM 04/10/2016  7:57 PM Full Code [id number]  Epifanio Lesches, MD ED   11/20/2015  3:03 PM 11/20/2015  6:10 PM Full Code [id number]  Algernon Huxley, MD Inpatient   08/14/2015  5:40 AM 08/21/2015 10:45 PM Full Code [id number]  Juluis Mire, MD Inpatient   08/02/2015  1:44 PM 08/11/2015 10:21 PM Full Code [id number]  Gladstone Lighter, MD Inpatient      Family Communication: Case discussed with Husband at the bedside Disposition Plan: To be determined  Consultants:  Critical care specialist  Nephrology  Orthopedic surgery  Antibiotics:  Levaquin  Vancomycin  Time spent: 25 minutes  [place], [place]

## 2016-09-16 NOTE — Progress Notes (Signed)
Pharmacy Antibiotic Note  [name] is a 82 y.o. female admitted on 09/09/2016 with recent fracture, fall and hypoxia. Pharmacy consulted for cefepime and vancomycin for possible HCAP.  Patient received HD MWF. Cefepime d/c'd 10/5. Levaquin started 10/5.   Plan: Will give Vancomycin 2gm LD, followed by vancomycin 1g IV after each HD on MWF. Plan for trough on 3rd HD day.  Pt is currently off schedule for dialysis and will need to follow dialysis schedule to ensure doses are given. Trough is due prior to next HD. Nephro note today states HD later today. Will order trough for today.   Vanc: 2g 10/1 at 1125 1g 10/2 at 1604 1g 10/3 at 1734  Vanc random level before HD today at 24. Continue current dosing. Called HD RN and informed him that pt needs a dose of vanc with HD today and will send dose up to floor as he plans on doing HD in the room.    Will order levofloxacin 750 mg PO x1 to complete 7 days abx (discussed length of therapy with MD).      Height: 5\' 1"  (154.9 cm) Weight: 227 lb 4.7 oz (103.1 kg) IBW/kg (Calculated) : 47.8  Temp (24hrs), Avg:98.3 F (36.8 C), Min:97.5 F (36.4 C), Max:99.5 F (37.5 C)   Recent Labs Lab 09/09/16 1754 09/10/16 0623 09/10/16 0839 09/10/16 1145 09/11/16 0212 09/12/16 0504 09/13/16 0415 09/14/16 0508 09/16/16 1500  WBC 15.2* 16.2*  --   --  21.8* 17.1* 13.5* 14.0*  --   CREATININE 4.11* 4.31*  --   --  3.21* 4.39* 2.82*  --   --   LATICACIDVEN  --   --  5.3* 4.1*  --   --   --   --   --   VANCORANDOM  --   --   --   --   --   --   --   --  24    Estimated Creatinine Clearance: 17.6 mL/min (by C-G formula based on SCr of 2.82 mg/dL (H)).    Allergies  Allergen Reactions  . Angiotensin Receptor Blockers Other (See Comments)    Reaction:  Hyperkalemia  . Penicillins Rash and Other (See Comments)    rash    Antimicrobials this admission: 9/29 Azithromycin >> 10/4 10/1 cefepime >> 10/4 10/1 Vancomycin >> levaquin 10/5  >>  Dose adjustments this admission:  Microbiology results: 9/29 BCx: NG 9/29 MRSA PCR: positive   Thank you for allowing pharmacy to be a part of this patient's care.  Rayna Sexton, PharmD, BCPS Clinical Pharmacist 09/16/2016 3:33 PM

## 2016-09-16 NOTE — Progress Notes (Signed)
Dialysis nearly complete, son at bedside. Pt c/o pain "all over", did not complain until son arrived, received tylenol and haldol.Pt is less agitated with son at bedside, L mitt in place, R hand with o2 sat probe in place. Pt is answering questions appropriately at this time.

## 2016-09-16 NOTE — Progress Notes (Signed)
Speech Therapy note: reviewed chart notes; consulted NSG re: pt's status today. Pt ate a full breakfast and swallowed pills crushed in puree w/ NSG; no overt s/s of aspiration were noted during oral intake this morning. Pt is scheduled for dialysis today. She has been agitated and pulling at things; NSG giving medication.  ST will f/u w/ further education as needed while admitted. Recommend continue Dysphagia 3 diet w/ thin liquids w/ general aspiration precautions.  NSG agreed.

## 2016-09-16 NOTE — Plan of Care (Signed)
Problem: Bowel/Gastric: Goal: Will not experience complications related to bowel motility Outcome: Progressing Pt continues to progress toward discharge.

## 2016-09-17 LAB — CBC
HEMATOCRIT: 32.2 % — AB (ref 35.0–47.0)
HEMOGLOBIN: 10.9 g/dL — AB (ref 12.0–16.0)
MCH: 34.6 pg — AB (ref 26.0–34.0)
MCHC: 33.9 g/dL (ref 32.0–36.0)
MCV: 102.1 fL — AB (ref 80.0–100.0)
Platelets: 82 10*3/uL — ABNORMAL LOW (ref 150–440)
RBC: 3.16 MIL/uL — AB (ref 3.80–5.20)
RDW: 16.7 % — ABNORMAL HIGH (ref 11.5–14.5)
WBC: 12.8 10*3/uL — ABNORMAL HIGH (ref 3.6–11.0)

## 2016-09-17 LAB — GLUCOSE, CAPILLARY
GLUCOSE-CAPILLARY: 230 mg/dL — AB (ref 65–99)
GLUCOSE-CAPILLARY: 226 mg/dL — AB (ref 65–99)
Glucose-Capillary: 173 mg/dL — ABNORMAL HIGH (ref 65–99)
Glucose-Capillary: 189 mg/dL — ABNORMAL HIGH (ref 65–99)

## 2016-09-17 MED ORDER — METOPROLOL TARTRATE 25 MG PO TABS
12.5000 mg | ORAL_TABLET | Freq: Two times a day (BID) | ORAL | Status: DC
  Administered 2016-09-17 – 2016-09-21 (×8): 12.5 mg via ORAL
  Filled 2016-09-17 (×9): qty 1

## 2016-09-17 MED ORDER — MIDODRINE HCL 5 MG PO TABS
10.0000 mg | ORAL_TABLET | Freq: Three times a day (TID) | ORAL | Status: DC
  Administered 2016-09-17 – 2016-09-21 (×10): 10 mg via ORAL
  Filled 2016-09-17 (×10): qty 2

## 2016-09-17 NOTE — Plan of Care (Signed)
Problem: Bowel/Gastric: Goal: Will not experience complications related to bowel motility Outcome: Progressing Pt continues to progress toward discharge. Pt continues to be dependent upon dialysis, family members are attentive, physical therapy eval completed.

## 2016-09-17 NOTE — Progress Notes (Signed)
Patient ID: [name], female   DOB: 02-Jan-1934, 82 y.o.   MRN: [id number]    [facility] PROGRESS NOTE  [name] [id number] DOB: [birth year] DOA: 09/09/2016 PCP: Rusty Aus, MD  HPI/Subjective: Patient more alert today and is able to answer questions. She asked me not to kill her. And I had to explain that I am trying to help her. Patient then told me that she loved me. Some cough. No complaints of pain.  Objective: Vitals:   09/17/16 0354 09/17/16 0749  BP: (!) 107/47 (!) 104/46  Pulse: (!) 118 (!) 117  Resp: 19 (!) 22  Temp: 97.9 F (36.6 C) 99.4 F (37.4 C)    Filed Weights   09/16/16 1459 09/16/16 1810 09/17/16 0354  Weight: 103.1 kg (227 lb 4.7 oz) 101.2 kg (223 lb 1.7 oz) 102.5 kg (226 lb)    ROS: Review of Systems  Unable to perform ROS: Critical illness  Respiratory: Positive for cough.   Cardiovascular: Negative for chest pain.  Gastrointestinal: Negative for abdominal pain.  Musculoskeletal: Negative for joint pain.  Review of systems Limited with acute encephalopathy Exam: Physical Exam  Constitutional: She appears lethargic.  HENT:  Nose: No mucosal edema.  Mouth/Throat: No oropharyngeal exudate or posterior oropharyngeal edema.  Eyes: Lids are normal. Pupils are equal, round, and reactive to light.  Neck: No thyroid mass and no thyromegaly present.  Cardiovascular: An irregularly irregular rhythm present.  Murmur heard.  Systolic murmur is present with a grade of 2/6  Respiratory: She has decreased breath sounds in the right lower field and the left lower field. She has no wheezes. She has rhonchi in the right lower field and the left lower field.  GI: Soft. Bowel sounds are normal. There is no tenderness.  Musculoskeletal:       Right ankle: She exhibits swelling.       Left ankle: She exhibits swelling.  Lymphadenopathy:    She has no cervical adenopathy.  Neurological: She appears lethargic.  Skin: Skin is warm.  Bruising  bilateral lower extremities  Psychiatric: Her affect is blunt. Her speech is delayed.      Data Reviewed: Basic Metabolic Panel:  Recent Labs Lab 09/10/16 1741 09/11/16 0212 09/12/16 0504 09/12/16 1706 09/13/16 0415  NA  --  129* 127*  --  134*  K 3.6 4.1 5.3*  --  3.7  CL  --  93* 92*  --  95*  CO2  --  23 22  --  29  GLUCOSE  --  98 190*  --  161*  BUN  --  26* 46*  --  29*  CREATININE  --  3.21* 4.39*  --  2.82*  CALCIUM  --  8.9 8.6*  --  8.4*  MG  --  1.8  --   --  1.8  PHOS  --  4.1  --  6.4* 4.2   Liver Function Tests: No results for input(s): AST, ALT, ALKPHOS, BILITOT, PROT, ALBUMIN in the last 168 hours. CBC:  Recent Labs Lab 09/11/16 0212 09/12/16 0504 09/13/16 0415 09/14/16 0508 09/17/16 0323  WBC 21.8* 17.1* 13.5* 14.0* 12.8*  NEUTROABS 17.5* 14.5*  --   --   --   HGB 11.1* 10.5* 10.3* 10.6* 10.9*  HCT 33.8* 31.8* 30.4* 31.6* 32.2*  MCV 100.2* 100.8* 99.8 101.0* 102.1*  PLT 148* 132* 112* 108* 82*    CBG:  Recent Labs Lab 09/16/16 1133 09/16/16 1729 09/16/16 2106 09/17/16 0750 09/17/16  [place]* 230*    Recent Results (from the past 240 hour(s))  MRSA PCR Screening     Status: Abnormal   Collection Time: 09/10/16  1:47 AM  Result Value Ref Range Status   MRSA by PCR POSITIVE (A) NEGATIVE Final    Comment:        The GeneXpert MRSA Assay (FDA approved for NASAL specimens only), is one component of a comprehensive MRSA colonization surveillance program. It is not intended to diagnose MRSA infection nor to guide or monitor treatment for MRSA infections. RESULT CALLED TO, READ BACK BY AND VERIFIED WITH: MONIQUE ROBERSON ON 09/10/16 AT 0359 BY TLB   CULTURE, BLOOD (ROUTINE X 2) w Reflex to ID Panel     Status: None   Collection Time: 09/10/16  8:19 AM  Result Value Ref Range Status   Specimen Description BLOOD RIGHT HAND  Final   Special Requests BOTTLES DRAWN AEROBIC AND ANAEROBIC .5ML  Final   Culture NO  GROWTH 5 DAYS  Final   Report Status 09/15/2016 FINAL  Final  CULTURE, BLOOD (ROUTINE X 2) w Reflex to ID Panel     Status: None   Collection Time: 09/10/16  6:04 PM  Result Value Ref Range Status   Specimen Description BLOOD RIGHT HAND  Final   Special Requests BOTTLES DRAWN AEROBIC AND ANAEROBIC .5ML  Final   Culture NO GROWTH 5 DAYS  Final   Report Status 09/15/2016 FINAL  Final      Scheduled Meds: . allopurinol  100 mg Oral Daily  . cholecalciferol  1,000 Units Oral Daily  . fluticasone  2 spray Each Nare QHS  . heparin subcutaneous  5,000 Units Subcutaneous Q8H  . insulin aspart  0-20 Units Subcutaneous TID WC  . insulin aspart  0-5 Units Subcutaneous QHS  . metoprolol tartrate  12.5 mg Oral BID  . midodrine  10 mg Oral TID WC  . mupirocin ointment  1 application Nasal BID  . pantoprazole  40 mg Oral QAC breakfast  . polyethylene glycol  17 g Oral Daily  . QUEtiapine  25 mg Oral QHS  . sevelamer carbonate  0.8 g Oral BID BM  . sevelamer carbonate  1.6 g Oral TID WC  . sodium chloride flush  10-40 mL Intracatheter Q12H  . sodium chloride flush  3 mL Intravenous Q12H  . sodium chloride flush  3 mL Intravenous Q12H    Assessment/Plan:  1. Septic shock with multiorgan failure. Patient finished antibiotics for pneumonia. Patient made a DO NOT RESUSCITATE.   2. Acute hypoxic respiratory failure. Patient is breathing comfortably on room air. Required BiPAP and was close to being intubated while in the ICU. 3. Acute metabolic encephalopathy secondary to in-hospital delirium. When necessary Haldol and Seroquel at night given. Mental status little bit better today than yesterday. Do not give any further pain medications. Tylenol for pain. 4. Right distal femoral fracture. Orthopedic will not operate. Overall prognosis poor. Try to convert to Tylenol as quickly as possible. 5. End-stage renal disease on hemodialysis. Dialysis as per nephrology. Hyperkalemia and hyponatremia during  hospital course. 6. Type 2 diabetes on sliding scale 7. History of atrial fibrillation. At times heart rate is fast and I will restart low-dose metoprolol. 8. History of congestive heart failure. Dialysis to remove fluid 9. History of gout on allopurinol 10. Chronic hypotension increase midodrine to 10 mg 3 times a day 11. Thrombocytopenia. Stop heparin subcutaneous. 12. Physical therapy evaluation for  rehabilitation. 13. Overall prognosis is poor and patient is a DO NOT RESUSCITATE.  Code Status:     Code Status Orders        Start     Ordered   09/11/16 1213  Do not attempt resuscitation (DNR)  Continuous    Question Answer Comment  In the event of cardiac or respiratory ARREST Do not call a "code blue"   In the event of cardiac or respiratory ARREST Do not perform Intubation, CPR, defibrillation or ACLS   In the event of cardiac or respiratory ARREST Use medication by any route, position, wound care, and other measures to relive pain and suffering. May use oxygen, suction and manual treatment of airway obstruction as needed for comfort.      09/11/16 1212    Code Status History    Date Active Date Inactive Code Status Order ID Comments User Context   09/10/2016 12:48 AM 09/10/2016  2:44 PM Full Code [id number]  Harvie Bridge, DO Inpatient   04/06/2016  4:59 PM 04/10/2016  7:57 PM Full Code [id number]  Epifanio Lesches, MD ED   11/20/2015  3:03 PM 11/20/2015  6:10 PM Full Code [id number]  Algernon Huxley, MD Inpatient   08/14/2015  5:40 AM 08/21/2015 10:45 PM Full Code [id number]  Juluis Mire, MD Inpatient   08/02/2015  1:44 PM 08/11/2015 10:21 PM Full Code [id number]  Gladstone Lighter, MD Inpatient     Family Communication: Case discussed with Husband at the bedside Disposition Plan: Husband would like to go to rehab  Consultants:  Critical care specialist  Nephrology  Orthopedic surgery  Antibiotics: - finished course  Time spent: 28 minutes  Loletha Grayer  [employer]

## 2016-09-17 NOTE — Progress Notes (Signed)
This Probation officer discussed subcu heparin with Dr. Earleen Newport, pt has platelet level of 87, per Dr., hold heparin today.

## 2016-09-17 NOTE — Progress Notes (Signed)
[facility]  ROUNDING NOTE   Subjective:   Dialysis yesterday. UF of 1900. Tolerated treatment well.    Objective:  Vital signs in last 24 hours:  Temp:  [97.5 F (36.4 C)-99.4 F (37.4 C)] 99.4 F (37.4 C) (10/06 0749) Pulse Rate:  [99-118] 117 (10/06 0749) Resp:  [16-22] 22 (10/06 0749) BP: (92-113)/(46-70) 104/46 (10/06 0749) SpO2:  [97 %-100 %] 97 % (10/06 0749) Weight:  [101.2 kg (223 lb 1.7 oz)-103.1 kg (227 lb 4.7 oz)] 102.5 kg (226 lb) (10/06 0354)  Weight change:  Filed Weights   09/16/16 1459 09/16/16 1810 09/17/16 0354  Weight: 103.1 kg (227 lb 4.7 oz) 101.2 kg (223 lb 1.7 oz) 102.5 kg (226 lb)    Intake/Output: I/O last 3 completed shifts: In: 0  Out: 1900 [Other:1900]   Intake/Output this shift:  No intake/output data recorded.  Physical Exam: General: ill appearing.   Head: Normocephalic, atraumatic  Eyes: Anicteric  Neck: Supple, trachea midline  Lungs:  Crackles, CPAP  Heart: Tachycardia, irregular  Abdomen:  Soft, nontender, BS present   Extremities: Right leg in an immobilizer, no peripheral edema  Neurologic: Alert and oriented.   Skin: No lesions  Access: LUE AVF    Basic Metabolic Panel:  Recent Labs Lab 09/10/16 1741 09/11/16 0212 09/12/16 0504 09/12/16 1706 09/13/16 0415  NA  --  129* 127*  --  134*  K 3.6 4.1 5.3*  --  3.7  CL  --  93* 92*  --  95*  CO2  --  23 22  --  29  GLUCOSE  --  98 190*  --  161*  BUN  --  26* 46*  --  29*  CREATININE  --  3.21* 4.39*  --  2.82*  CALCIUM  --  8.9 8.6*  --  8.4*  MG  --  1.8  --   --  1.8  PHOS  --  4.1  --  6.4* 4.2    Liver Function Tests: No results for input(s): AST, ALT, ALKPHOS, BILITOT, PROT, ALBUMIN in the last 168 hours. No results for input(s): LIPASE, AMYLASE in the last 168 hours. No results for input(s): AMMONIA in the last 168 hours.  CBC:  Recent Labs Lab 09/11/16 0212 09/12/16 0504 09/13/16 0415 09/14/16 0508 09/17/16 0323  WBC 21.8* 17.1*  13.5* 14.0* 12.8*  NEUTROABS 17.5* 14.5*  --   --   --   HGB 11.1* 10.5* 10.3* 10.6* 10.9*  HCT 33.8* 31.8* 30.4* 31.6* 32.2*  MCV 100.2* 100.8* 99.8 101.0* 102.1*  PLT 148* 132* 112* 108* 82*    Cardiac Enzymes: No results for input(s): CKTOTAL, CKMB, CKMBINDEX, TROPONINI in the last 168 hours.  BNP: Invalid input(s): POCBNP  CBG:  Recent Labs Lab 09/16/16 1133 09/16/16 1729 09/16/16 2106 09/17/16 0750 09/17/16 1108  GLUCAP 182* 134* 156* 173* 62*    Microbiology: Results for orders placed or performed during the hospital encounter of 09/09/16  MRSA PCR Screening     Status: Abnormal   Collection Time: 09/10/16  1:47 AM  Result Value Ref Range Status   MRSA by PCR POSITIVE (A) NEGATIVE Final    Comment:        The GeneXpert MRSA Assay (FDA approved for NASAL specimens only), is one component of a comprehensive MRSA colonization surveillance program. It is not intended to diagnose MRSA infection nor to guide or monitor treatment for MRSA infections. RESULT CALLED TO, READ BACK BY AND VERIFIED WITH: MONIQUE ROBERSON  ON 09/10/16 AT 0359 BY TLB   CULTURE, BLOOD (ROUTINE X 2) w Reflex to ID Panel     Status: None   Collection Time: 09/10/16  8:19 AM  Result Value Ref Range Status   Specimen Description BLOOD RIGHT HAND  Final   Special Requests BOTTLES DRAWN AEROBIC AND ANAEROBIC .5ML  Final   Culture NO GROWTH 5 DAYS  Final   Report Status 09/15/2016 FINAL  Final  CULTURE, BLOOD (ROUTINE X 2) w Reflex to ID Panel     Status: None   Collection Time: 09/10/16  6:04 PM  Result Value Ref Range Status   Specimen Description BLOOD RIGHT HAND  Final   Special Requests BOTTLES DRAWN AEROBIC AND ANAEROBIC .5ML  Final   Culture NO GROWTH 5 DAYS  Final   Report Status 09/15/2016 FINAL  Final    Coagulation Studies: No results for input(s): LABPROT, INR in the last 72 hours.  Urinalysis: No results for input(s): COLORURINE, LABSPEC, PHURINE, GLUCOSEU, HGBUR,  BILIRUBINUR, KETONESUR, PROTEINUR, UROBILINOGEN, NITRITE, LEUKOCYTESUR in the last 72 hours.  Invalid input(s): APPERANCEUR    Imaging: No results found.   Medications:     . allopurinol  100 mg Oral Daily  . cholecalciferol  1,000 Units Oral Daily  . fluticasone  2 spray Each Nare QHS  . heparin subcutaneous  5,000 Units Subcutaneous Q8H  . insulin aspart  0-20 Units Subcutaneous TID WC  . insulin aspart  0-5 Units Subcutaneous QHS  . metoprolol tartrate  12.5 mg Oral BID  . midodrine  10 mg Oral TID WC  . mupirocin ointment  1 application Nasal BID  . pantoprazole  40 mg Oral QAC breakfast  . polyethylene glycol  17 g Oral Daily  . QUEtiapine  25 mg Oral QHS  . sevelamer carbonate  0.8 g Oral BID BM  . sevelamer carbonate  1.6 g Oral TID WC  . sodium chloride flush  10-40 mL Intracatheter Q12H  . sodium chloride flush  3 mL Intravenous Q12H  . sodium chloride flush  3 mL Intravenous Q12H   sodium chloride, acetaminophen **OR** acetaminophen, haloperidol, ipratropium-albuterol, nystatin cream, ondansetron **OR** ondansetron (ZOFRAN) IV, senna, sodium chloride flush, sodium chloride flush  Assessment/ Plan:  82 y.o. white female with end-stage renal disease on hemodialysis, diabetes mellitus type 2, hypertension, obstructive sleep apnea, atrial fibrillation, arthritis, anemia admitted on 09/09/2016 for Hyperkalemia [E87.5] Hyponatremia [E87.1] Weakness [R53.1] Hypoxia [R09.02] Closed nondisplaced fracture of lateral condyle of right femur, initial encounter ([place]) [S72.424A]   MWF [facility]  1.  End-stage renal disease with hyperkalemia: hemodialysis yesterday. IV Albumin and midodrine with treatment. UF goal of 1.5-2 litres.  - monitor daily for dailysis need. - Low potassium diet.   2.  Hypotension:  - midodrine  - IV albumin with HD treatment  3.  Anemia of chronic kidney disease - EPO with dialysis  4.  Secondary hyperparathyroidism:  Phosphorus  and calcium at goal.  -  sevelamer with meals.    LOS: [place] 10/6/20172:07 PM

## 2016-09-17 NOTE — Care Management (Signed)
Regardless of discharge setting- skilled or home with home health, patient would greatly benefit from palliative care consult  to also follow.  Her prognosis is poor and potential for frequent readmissions

## 2016-09-17 NOTE — Care Management (Signed)
Spoke with patient and her husband.  Both wish to pursue skilled nursing placement.  She has a mobility boot to her left lower extremity due to a fracture several months ago.  She now has an impacted fx right distal femur just above her knee replacement.  Ortho consult 9/29 says the plan at that time was non operative.  Patient's husband questions why ortho has not come back to speak with him.  Patient has not had any physical therapy since admission.  Discussed patient and husband's concern of no physical therapy- (wishes for skilled placement) and has not been assessed by orthopedics. Mr Ranieri states that :Dr Sabra Heck said he would come back."  At present, unable to predict patient's rehab potential .  Mr Sartwell voices many concerns about how patient would get to dialysis and transfer to dialysis chair.  He wonders if a hoyer lift would be available.   Says patient is currently sitting for her dialysis.   Mr Rantanen says he had not planned for patient to discharge home.  Patient says she wants to live.

## 2016-09-17 NOTE — Progress Notes (Signed)
Chaplain made a follow-up visit with patient but the patient was asleep. However chaplain talked briefly with patient's  husband outside the room and promised to visit at a later time.     09/17/16 1300  Clinical Encounter Type  Visited With Patient  Visit Type Follow-up;Spiritual support  Spiritual Encounters  Spiritual Needs Prayer

## 2016-09-17 NOTE — Progress Notes (Signed)
Shift assessment completed at 0715. Pt asleep at that time with cpap in place. Respirations are shallow, lungs decreased to bilat bases. Hr is regular, abdomen is soft, bs hypoactive. Pt is wearing incontinence brief. L arm with pink band and dressing to dialysis fistula is dry and intact, mitt removed from l hand at this time. PP are faint, +2 pitting edema noted to bilat le's, cap refill wnl. Pt has immobilizer in place to R leg. Heels are floated. Dressing intact to r neck. Since assessment, pt has received meds and was fed her breakfast. Dr. Earleen Newport has rounded. Pt is alert with visitors but unable to follow conversation well, inserts phrases that are unrelated.

## 2016-09-17 NOTE — Evaluation (Signed)
Physical Therapy Evaluation Patient Details Name: [name] MRN: [id number] DOB: [birth year] Today's Date: 09/17/2016   History of Present Illness  82 y/o female who fell after knee buckling while doing exercises at home. She is primarily WC bound at baseline. Upon admission pt found to be hypotensive, hypoxic and hyponatremic. Imaging reveals old L ankle fractures (wears boot), new perihardware distal femur fracture. Rapid response was called early on the 29th, pt had been transfered into the CCU, palliative care was on board and no longer involved.  Clinical Impression  Pt very limited with what she is able to do.  She reports she has not walked in a very long time, unsure how long R LE with remain NWBing but it is unlikely that she we gain much function until she is able to take weight through it.  Pt very pain sensitive in b/l LEs and it seems unlikely that her left "good" leg would be able to assist with mobility much at this point given her c/o severe pain with even minimal movement/testing.  Pt eager and motivated to go to rehab and "get walking again" but PT was clear that she would have a very long path to even get back to a basic level of mobility/function.  Difficult to see pt making a lot of gains given her recent very limited level of activity compounded with new non-surgical distal R femur fx with NWBing status.  Pt unable to try getting to sitting this afternoon, will need total assist/Hoyer for mobility currently.     Follow Up Recommendations SNF (secondary to pain & WBing will likely have VERY slow/limited progress)    Equipment Recommendations       Recommendations for Other Services       Precautions / Restrictions Precautions Precautions: Fall Required Braces or Orthoses: Knee Immobilizer - Right ((also L LE walking boot?)) Restrictions RLE Weight Bearing: Non weight bearing LLE Weight Bearing:  (not specified, likely de facto limited if not orthopedically)       Mobility  Bed Mobility               General bed mobility comments: deferred mobility as pt yelling out in pain with even very limited PROM with either LE  Transfers                    Ambulation/Gait                Stairs            Wheelchair Mobility    Modified Rankin (Stroke Patients Only)       Balance                                             Pertinent Vitals/Pain Pain Assessment: 0-10 Pain Score: 10-Worst pain ever Pain Location: pt with severe pain reactions with even very minimal/cautious PROM of either LE - very poor tolerance    Home Living Family/patient expects to be discharged to:: Skilled nursing facility Living Arrangements: Spouse/significant other               Additional Comments: The plan had been to go home until recently, now pt wants to go to STR with the plan of getting back to walking    Prior Function Level of Independence: Needs assistance         Comments:  Pt unable to truly answer, but pt indicates that she has no been able to walk for quite a while and is very limited at baseline     Hand Dominance        Extremity/Trunk Assessment   Upper Extremity Assessment: Generalized weakness (grossly 3+/5 b/l with b/l shoulder elevation to 120)           Lower Extremity Assessment: Generalized weakness;RLE deficits/detail;LLE deficits/detail RLE Deficits / Details: deferred any WBing tasks, but pt unable to do more than trace ankle DF and with any PROM movement to test hip pt screamed out in pain LLE Deficits / Details: Pt with very pain limited motion in the L LE, no AROM in the ankle, severe pain with PROM - pt does not tolerate attempts to test knee and hip secodary to pain with palpation and even further c/o pain with PROM attempts.     Communication   Communication: No difficulties  Cognition Arousal/Alertness: Awake/alert Behavior During Therapy: WFL for tasks  assessed/performed Overall Cognitive Status: Difficult to assess       Memory: Decreased short-term memory              General Comments General comments (skin integrity, edema, etc.): pt with significant L LE edema and bruising, KI on R with intolerance of any movement    Exercises     Assessment/Plan    PT Assessment Patient needs continued PT services  PT Problem List Decreased range of motion;Decreased strength;Decreased activity tolerance;Decreased balance;Decreased mobility;Decreased safety awareness;Pain          PT Treatment Interventions DME instruction;Gait training;Functional mobility training;Therapeutic activities;Therapeutic exercise;Balance training;Patient/family education    PT Goals (Current goals can be found in the Care Plan section)  Acute Rehab PT Goals Patient Stated Goal: get better, get back to walking PT Goal Formulation: With patient Time For Goal Achievement: 10/01/16 Potential to Achieve Goals: Poor    Frequency Min 2X/week   Barriers to discharge        Co-evaluation               End of Session   Activity Tolerance: Patient limited by pain Patient left: with bed alarm set;with call bell/phone within reach Nurse Communication: Mobility status;Need for lift equipment         Time: 1350-1415 PT Time Calculation (min) (ACUTE ONLY): 25 min   Charges:   PT Evaluation $PT Eval Moderate Complexity: 1 Procedure     PT G Codes:        Kreg Shropshire, DPT 09/17/2016, 2:51 PM

## 2016-09-18 LAB — CBC
HEMATOCRIT: 31.8 % — AB (ref 35.0–47.0)
HEMATOCRIT: 31.6 % — AB (ref 35.0–47.0)
HEMOGLOBIN: 10.3 g/dL — AB (ref 12.0–16.0)
HEMOGLOBIN: 10.5 g/dL — AB (ref 12.0–16.0)
MCH: 33.7 pg (ref 26.0–34.0)
MCH: 34.1 pg — ABNORMAL HIGH (ref 26.0–34.0)
MCHC: 32.5 g/dL (ref 32.0–36.0)
MCHC: 33.1 g/dL (ref 32.0–36.0)
MCV: 103.8 fL — ABNORMAL HIGH (ref 80.0–100.0)
MCV: 103 fL — ABNORMAL HIGH (ref 80.0–100.0)
Platelets: 74 10*3/uL — ABNORMAL LOW (ref 150–440)
Platelets: 76 10*3/uL — ABNORMAL LOW (ref 150–440)
RBC: 3.06 MIL/uL — ABNORMAL LOW (ref 3.80–5.20)
RBC: 3.07 MIL/uL — AB (ref 3.80–5.20)
RDW: 17.9 % — AB (ref 11.5–14.5)
RDW: 17.6 % — ABNORMAL HIGH (ref 11.5–14.5)
WBC: 13.5 10*3/uL — ABNORMAL HIGH (ref 3.6–11.0)
WBC: 14.2 10*3/uL — AB (ref 3.6–11.0)

## 2016-09-18 LAB — BASIC METABOLIC PANEL
Anion gap: 8 (ref 5–15)
BUN: 28 mg/dL — AB (ref 6–20)
CALCIUM: 8.3 mg/dL — AB (ref 8.9–10.3)
CHLORIDE: 97 mmol/L — AB (ref 101–111)
CO2: 31 mmol/L (ref 22–32)
CREATININE: 4.05 mg/dL — AB (ref 0.44–1.00)
GFR calc non Af Amer: 10 mL/min — ABNORMAL LOW (ref >60)
GFR, EST AFRICAN AMERICAN: 11 mL/min — AB (ref >60)
Glucose, Bld: 234 mg/dL — ABNORMAL HIGH (ref 65–99)
Potassium: 3.9 mmol/L (ref 3.5–5.1)
SODIUM: 136 mmol/L (ref 135–145)

## 2016-09-18 LAB — GLUCOSE, CAPILLARY
GLUCOSE-CAPILLARY: 198 mg/dL — AB (ref 65–99)
Glucose-Capillary: 215 mg/dL — ABNORMAL HIGH (ref 65–99)
Glucose-Capillary: 232 mg/dL — ABNORMAL HIGH (ref 65–99)

## 2016-09-18 LAB — RENAL FUNCTION PANEL
ALBUMIN: 2.7 g/dL — AB (ref 3.5–5.0)
ANION GAP: 8 (ref 5–15)
BUN: 30 mg/dL — ABNORMAL HIGH (ref 6–20)
CHLORIDE: 96 mmol/L — AB (ref 101–111)
CO2: 29 mmol/L (ref 22–32)
Calcium: 8.2 mg/dL — ABNORMAL LOW (ref 8.9–10.3)
Creatinine, Ser: 4.47 mg/dL — ABNORMAL HIGH (ref 0.44–1.00)
GFR calc Af Amer: 10 mL/min — ABNORMAL LOW (ref >60)
GFR, EST NON AFRICAN AMERICAN: 8 mL/min — AB (ref >60)
Glucose, Bld: 279 mg/dL — ABNORMAL HIGH (ref 65–99)
PHOSPHORUS: 2.2 mg/dL — AB (ref 2.5–4.6)
POTASSIUM: 4.1 mmol/L (ref 3.5–5.1)
Sodium: 133 mmol/L — ABNORMAL LOW (ref 135–145)

## 2016-09-18 MED ORDER — EPOETIN ALFA 10000 UNIT/ML IJ SOLN
10000.0000 [IU] | Freq: Once | INTRAMUSCULAR | Status: AC
  Administered 2016-09-18: 10000 [IU] via INTRAVENOUS
  Filled 2016-09-18: qty 1

## 2016-09-18 MED ORDER — ALBUMIN HUMAN 25 % IV SOLN
25.0000 g | Freq: Once | INTRAVENOUS | Status: AC
  Administered 2016-09-18: 25 g via INTRAVENOUS
  Filled 2016-09-18: qty 100

## 2016-09-18 MED ORDER — GUAIFENESIN ER 600 MG PO TB12
600.0000 mg | ORAL_TABLET | Freq: Two times a day (BID) | ORAL | Status: DC
  Administered 2016-09-18 – 2016-09-21 (×6): 600 mg via ORAL
  Filled 2016-09-18 (×6): qty 1

## 2016-09-18 NOTE — Progress Notes (Signed)
Dialysis started 

## 2016-09-18 NOTE — Progress Notes (Signed)
Patient returned from Dialysis. 

## 2016-09-18 NOTE — Progress Notes (Signed)
Pre Dialysis 
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
## 2016-09-18 NOTE — Progress Notes (Signed)
Dialysis complete

## 2016-09-18 NOTE — Progress Notes (Signed)
[place] at [place] NAME: [name]    MR#:  [id number]  DATE OF BIRTH:  01/12/1934  SUBJECTIVE:  CHIEF COMPLAINT:   Chief Complaint  Patient presents with  . Fatigue   - more awake today, coughing - worsening lower extremity fluid build up- extra dialysis today  REVIEW OF SYSTEMS:  Review of Systems  Constitutional: Positive for malaise/fatigue. Negative for chills and fever.  HENT: Negative for ear discharge, ear pain and nosebleeds.   Eyes: Negative for blurred vision.  Respiratory: Positive for cough. Negative for shortness of breath and wheezing.   Cardiovascular: Positive for leg swelling. Negative for chest pain and palpitations.  Gastrointestinal: Negative for abdominal pain, constipation, diarrhea, nausea and vomiting.  Genitourinary: Negative for dysuria and urgency.  Musculoskeletal: Negative for myalgias.  Neurological: Negative for dizziness, sensory change, speech change, focal weakness, seizures and headaches.  Psychiatric/Behavioral: Negative for depression.    DRUG ALLERGIES:   Allergies  Allergen Reactions  . Angiotensin Receptor Blockers Other (See Comments)    Reaction:  Hyperkalemia  . Penicillins Rash and Other (See Comments)    rash    VITALS:  Blood pressure (!) 96/52, pulse (!) 110, temperature 98.4 F (36.9 C), temperature source Axillary, resp. rate 18, height 5\' 1"  (1.549 m), weight 104.4 kg (230 lb 2.9 oz), SpO2 96 %.  PHYSICAL EXAMINATION:  Physical Exam  GENERAL:  82 y.o.-year-old obese patient lying in the bed with no acute distress.  EYES: Pupils equal, round, reactive to light and accommodation. No scleral icterus. Extraocular muscles intact.  HEENT: Head atraumatic, normocephalic. Oropharynx and nasopharynx clear.  NECK:  Supple, no jugular venous distention. No thyroid enlargement, no tenderness.  LUNGS: Normal breath sounds bilaterally, Decreased basilar breath sounds. no wheezing,  rales,rhonchi or crepitation. No use of accessory muscles of respiration.  CARDIOVASCULAR: S1, S2 normal. No murmurs, rubs, or gallops.  ABDOMEN: Soft, nontender, nondistended. Bowel sounds present. No organomegaly or mass.  EXTREMITIES: No  cyanosis, or clubbing. 3+ edema of both lower extremities noted NEUROLOGIC: Cranial nerves II through XII are intact. Muscle strength 5/5 in all extremities. Sensation intact. Gait not checked.  PSYCHIATRIC: The patient is alert and oriented x 3.  SKIN: No obvious rash, lesion, or ulcer.    LABORATORY PANEL:   CBC  Recent Labs Lab 09/18/16 0324  WBC 13.5*  HGB 10.3*  HCT 31.8*  PLT 74*   ------------------------------------------------------------------------------------------------------------------  Chemistries   Recent Labs Lab 09/13/16 0415 09/18/16 0324  NA 134* 136  K 3.7 3.9  CL 95* 97*  CO2 29 31  GLUCOSE 161* 234*  BUN 29* 28*  CREATININE 2.82* 4.05*  CALCIUM 8.4* 8.3*  MG 1.8  --    ------------------------------------------------------------------------------------------------------------------  Cardiac Enzymes No results for input(s): TROPONINI in the last 168 hours. ------------------------------------------------------------------------------------------------------------------  RADIOLOGY:  No results found.  EKG:   Orders placed or performed during the hospital encounter of 09/09/16  . EKG 12-Lead  . EKG 12-Lead  . EKG 12-Lead  . EKG 12-Lead  . EKG 12-Lead  . EKG 12-Lead    ASSESSMENT AND PLAN:   82 year old female with end-stage renal disease on hemodialysis, diabetes, hypertension, sleep apnea, atrial fibrillation and arthritis admitted with fall and right femoral fracture.  #1 right femoral lateral condyle fracture-not a surgical candidate. -Appreciate orthopedics consult. Nonweightbearing at this time.  -Pain medications causing delirium. Change to Tylenol and mental status is much better.  #2  acute metabolic encephalopathy secondary  to in-hospital delirium. Mental status is much better. Avoid narcotics.  #3 sepsis with multiorgan failure on admission-secondary to pneumonia. Follow-up chest x-ray tomorrow. -Still has cough, recommended incentive spirometry and added Mucinex. -Blood pressure is more stable. Finished antibiotics  #4 history of atrial fibrillation-continue metoprolol. -Not on any anticoagulation due to thrombocytopenia and anemia  #5 end-stage renal disease on hemodialysis-on Monday Wednesday Friday hemodialysis. -Appreciate nephrology consult -Significant edema. IV albumin with hemodialysis treatments.  #6 GERD-Protonix  #7 DVT prophylaxis-heparin discontinued due to thrombocytopenia. Monitor  Patient and family refused LTAC. At this time plan is to discharge to rehabilitation in 2 days. Family is hoping that they can take the patient home if she improves.   All the records are reviewed and case discussed with Care Management/Social Workerr. Management plans discussed with the patient, family and they are in agreement.  CODE STATUS: DNR  TOTAL TIME TAKING CARE OF THIS PATIENT: 37 minutes.   POSSIBLE D/C IN 2 DAYS, DEPENDING ON CLINICAL CONDITION.   [name] M.D on 09/18/2016 at 1:21 PM  Between 7am to 6pm - Pager - [phone]  After 6pm go to [email or web address] - password [email or web address] [place] [facility]  Office  [phone]  CC: Primary care physician; Rusty Aus, MD

## 2016-09-18 NOTE — NC FL2 (Signed)
[place] LEVEL OF CARE SCREENING TOOL     IDENTIFICATION  Patient Name: [name] Birthdate: 1934/11/16 Sex: female Admission Date (Current Location): 09/09/2016  [place] and [place] Number:  Engineering geologist and Address:  [facility], [address], [place], [place] [address]      Provider Number: [id number]  Attending Physician Name and Address:  Gladstone Lighter, MD  Relative Name and Phone Number:       Current Level of Care: Hospital Recommended Level of Care: [place] Prior Approval Number:    Date Approved/Denied: 11/27/16 PASRR Number: [id number]  Discharge Plan: SNF    Current Diagnoses: Patient Active Problem List   Diagnosis Date Noted  . Palliative care by specialist   . Closed nondisplaced fracture of lateral condyle of right femur ([place])   . Hyponatremia 09/09/2016  . Hypoxia 04/06/2016  . Healthcare-associated pneumonia 08/14/2015  . Hypoglycemia 08/14/2015  . Hypotension 08/14/2015  . ESRD on hemodialysis ([place]) 08/14/2015  . DM (diabetes mellitus) ([place]) 08/14/2015  . Pleural effusion on right   . Protein-calorie malnutrition, severe ([place]) 08/06/2015  . Community acquired pneumonia   . Pleural effusion 08/03/2015  . Acute respiratory failure ([place]) 08/03/2015  . Cough 08/03/2015  . Atrial fibrillation ([place]) 08/03/2015    Orientation RESPIRATION BLADDER Height & Weight     Self, Time, Place  Normal Continent Weight: 230 lb 2.9 oz (104.4 kg) Height:  5\' 1"  (154.9 cm)  BEHAVIORAL SYMPTOMS/MOOD NEUROLOGICAL BOWEL NUTRITION STATUS      Continent    AMBULATORY STATUS COMMUNICATION OF NEEDS Skin   Total Care Verbally Surgical wounds                       Personal Care Assistance Level of Assistance  Bathing, Dressing, Feeding Bathing Assistance: Maximum assistance Feeding assistance: Independent Dressing Assistance: Maximum assistance     Functional Limitations Info              SPECIAL CARE FACTORS FREQUENCY  PT (By licensed PT), OT (By licensed OT)     PT Frequency: 5X day 5X week OT Frequency: 5x day 5x week            Contractures Contractures Info: Present    Additional Factors Info  Code Status, Allergies Code Status Info: DNR Allergies Info: Angiotensin Receptor Blockers, Penicillins           Current Medications (09/18/2016):  This is the current hospital active medication list Current Facility-Administered Medications  Medication Dose Route Frequency Provider Last Rate Last Dose  . 0.9 %  sodium chloride infusion  250 mL Intravenous PRN Alexis Hugelmeyer, DO      . acetaminophen (TYLENOL) tablet 650 mg  650 mg Oral Q6H PRN Alexis Hugelmeyer, DO   650 mg at 09/17/16 0818   Or  . acetaminophen (TYLENOL) suppository 650 mg  650 mg Rectal Q6H PRN Alexis Hugelmeyer, DO      . albumin human 25 % solution 25 g  25 g Intravenous Once Lavonia Dana, MD      . allopurinol (ZYLOPRIM) tablet 100 mg  100 mg Oral Daily Alexis Hugelmeyer, DO   100 mg at 09/18/16 1036  . cholecalciferol (VITAMIN D) tablet 1,000 Units  1,000 Units Oral Daily Alexis Hugelmeyer, DO   1,000 Units at 09/18/16 1036  . epoetin alfa (EPOGEN,PROCRIT) injection 10,000 Units  10,000 Units Intravenous Once Lavonia Dana, MD      . fluticasone (  FLONASE) 50 MCG/ACT nasal spray 2 spray  2 spray Each Nare QHS Alexis Hugelmeyer, DO   2 spray at 09/17/16 2131  . guaiFENesin (MUCINEX) 12 hr tablet 600 mg  600 mg Oral BID Gladstone Lighter, MD      . haloperidol (HALDOL) tablet 1 mg  1 mg Oral Q6H PRN Loletha Grayer, MD   1 mg at 09/17/16 1041  . insulin aspart (novoLOG) injection 0-20 Units  0-20 Units Subcutaneous TID WC Alexis Hugelmeyer, DO   7 Units at 09/18/16 1142  . insulin aspart (novoLOG) injection 0-5 Units  0-5 Units Subcutaneous QHS Alexis Hugelmeyer, DO   2 Units at 09/17/16 2131  . ipratropium-albuterol (DUONEB) 0.5-2.5 (3) MG/3ML nebulizer solution 3 mL  3 mL  Nebulization QID PRN Alexis Hugelmeyer, DO      . metoprolol tartrate (LOPRESSOR) tablet 12.5 mg  12.5 mg Oral BID Loletha Grayer, MD   12.5 mg at 09/17/16 1225  . midodrine (PROAMATINE) tablet 10 mg  10 mg Oral TID WC Loletha Grayer, MD   10 mg at 09/18/16 1142  . nystatin cream (MYCOSTATIN) 1 application  1 application Topical BID PRN Alexis Hugelmeyer, DO      . ondansetron (ZOFRAN) tablet 4 mg  4 mg Oral Q6H PRN Alexis Hugelmeyer, DO       Or  . ondansetron (ZOFRAN) injection 4 mg  4 mg Intravenous Q6H PRN Alexis Hugelmeyer, DO   4 mg at 09/13/16 1658  . pantoprazole (PROTONIX) EC tablet 40 mg  40 mg Oral QAC breakfast Alexis Hugelmeyer, DO   40 mg at 09/17/16 0817  . polyethylene glycol (MIRALAX / GLYCOLAX) packet 17 g  17 g Oral Daily Alexis Hugelmeyer, DO   17 g at 09/17/16 1041  . QUEtiapine (SEROQUEL) tablet 25 mg  25 mg Oral QHS Loletha Grayer, MD   25 mg at 09/17/16 2129  . senna (SENOKOT) tablet 17.2 mg  2 tablet Oral Daily PRN Alexis Hugelmeyer, DO      . sevelamer carbonate (RENVELA) packet 0.8 g  0.8 g Oral BID BM Alexis Hugelmeyer, DO   0.8 g at 09/17/16 1040  . sevelamer carbonate (RENVELA) packet 1.6 g  1.6 g Oral TID WC Alexis Hugelmeyer, DO   1.6 g at 09/18/16 1142  . sodium chloride flush (NS) 0.9 % injection 10-40 mL  10-40 mL Intracatheter Q12H Awilda Bill, NP   10 mL at 09/18/16 1045  . sodium chloride flush (NS) 0.9 % injection 10-40 mL  10-40 mL Intracatheter PRN Awilda Bill, NP      . sodium chloride flush (NS) 0.9 % injection 3 mL  3 mL Intravenous Q12H Alexis Hugelmeyer, DO   3 mL at 09/18/16 1044  . sodium chloride flush (NS) 0.9 % injection 3 mL  3 mL Intravenous Q12H Alexis Hugelmeyer, DO   3 mL at 09/18/16 1044  . sodium chloride flush (NS) 0.9 % injection 3 mL  3 mL Intravenous PRN Alexis Hugelmeyer, DO   3 mL at 09/14/16 2133     Discharge Medications: Please see discharge summary for a list of discharge medications.  Relevant Imaging  Results:  Relevant Lab Results:   Additional Information  SS [phone]  Zettie Pho, LCSW

## 2016-09-18 NOTE — Progress Notes (Signed)
[facility]  ROUNDING NOTE   Subjective:   Eating breakfast. Family at bedside.    Objective:  Vital signs in last 24 hours:  Temp:  [97.9 F (36.6 C)-99.2 F (37.3 C)] 98.4 F (36.9 C) (10/07 0746) Pulse Rate:  [108-118] 110 (10/07 0746) Resp:  [16-18] 18 (10/07 0746) BP: (96-120)/(52-56) 96/52 (10/07 0746) SpO2:  [96 %-100 %] 96 % (10/07 0746) Weight:  [104.4 kg (230 lb 2.9 oz)] 104.4 kg (230 lb 2.9 oz) (10/07 0453)  Weight change: 1.309 kg (2 lb 14.2 oz) Filed Weights   09/16/16 1810 09/17/16 0354 09/18/16 0453  Weight: 101.2 kg (223 lb 1.7 oz) 102.5 kg (226 lb) 104.4 kg (230 lb 2.9 oz)    Intake/Output: I/O last 3 completed shifts: In: 500 [P.O.:500] Out: 0    Intake/Output this shift:  No intake/output data recorded.  Physical Exam: General: ill appearing.   Head: Normocephalic, atraumatic  Eyes: Anicteric  Neck: Supple, trachea midline  Lungs:  Crackles, CPAP  Heart: Tachycardia, irregular  Abdomen:  Soft, nontender, BS present   Extremities: Right leg in an immobilizer, 1+ peripheral edema  Neurologic: Alert and oriented.   Skin: No lesions  Access: LUE AVF    Basic Metabolic Panel:  Recent Labs Lab 09/12/16 0504 09/12/16 1706 09/13/16 0415 09/18/16 0324  NA 127*  --  134* 136  K 5.3*  --  3.7 3.9  CL 92*  --  95* 97*  CO2 22  --  29 31  GLUCOSE 190*  --  161* 234*  BUN 46*  --  29* 28*  CREATININE 4.39*  --  2.82* 4.05*  CALCIUM 8.6*  --  8.4* 8.3*  MG  --   --  1.8  --   PHOS  --  6.4* 4.2  --     Liver Function Tests: No results for input(s): AST, ALT, ALKPHOS, BILITOT, PROT, ALBUMIN in the last 168 hours. No results for input(s): LIPASE, AMYLASE in the last 168 hours. No results for input(s): AMMONIA in the last 168 hours.  CBC:  Recent Labs Lab 09/12/16 0504 09/13/16 0415 09/14/16 0508 09/17/16 0323 09/18/16 0324  WBC 17.1* 13.5* 14.0* 12.8* 13.5*  NEUTROABS 14.5*  --   --   --   --   HGB 10.5* 10.3* 10.6*  10.9* 10.3*  HCT 31.8* 30.4* 31.6* 32.2* 31.8*  MCV 100.8* 99.8 101.0* 102.1* 103.8*  PLT 132* 112* 108* 82* 74*    Cardiac Enzymes: No results for input(s): CKTOTAL, CKMB, CKMBINDEX, TROPONINI in the last 168 hours.  BNP: Invalid input(s): POCBNP  CBG:  Recent Labs Lab 09/17/16 0750 09/17/16 1108 09/17/16 1727 09/17/16 2119 09/18/16 0748  GLUCAP 173* 230* 189* 226* 215*    Microbiology: Results for orders placed or performed during the hospital encounter of 09/09/16  MRSA PCR Screening     Status: Abnormal   Collection Time: 09/10/16  1:47 AM  Result Value Ref Range Status   MRSA by PCR POSITIVE (A) NEGATIVE Final    Comment:        The GeneXpert MRSA Assay (FDA approved for NASAL specimens only), is one component of a comprehensive MRSA colonization surveillance program. It is not intended to diagnose MRSA infection nor to guide or monitor treatment for MRSA infections. RESULT CALLED TO, READ BACK BY AND VERIFIED WITH: MONIQUE ROBERSON ON 09/10/16 AT 0359 BY TLB   CULTURE, BLOOD (ROUTINE X 2) w Reflex to ID Panel     Status: None  Collection Time: 09/10/16  8:19 AM  Result Value Ref Range Status   Specimen Description BLOOD RIGHT HAND  Final   Special Requests BOTTLES DRAWN AEROBIC AND ANAEROBIC .5ML  Final   Culture NO GROWTH 5 DAYS  Final   Report Status 09/15/2016 FINAL  Final  CULTURE, BLOOD (ROUTINE X 2) w Reflex to ID Panel     Status: None   Collection Time: 09/10/16  6:04 PM  Result Value Ref Range Status   Specimen Description BLOOD RIGHT HAND  Final   Special Requests BOTTLES DRAWN AEROBIC AND ANAEROBIC .5ML  Final   Culture NO GROWTH 5 DAYS  Final   Report Status 09/15/2016 FINAL  Final    Coagulation Studies: No results for input(s): LABPROT, INR in the last 72 hours.  Urinalysis: No results for input(s): COLORURINE, LABSPEC, PHURINE, GLUCOSEU, HGBUR, BILIRUBINUR, KETONESUR, PROTEINUR, UROBILINOGEN, NITRITE, LEUKOCYTESUR in the last 72  hours.  Invalid input(s): APPERANCEUR    Imaging: No results found.   Medications:     . albumin human  25 g Intravenous Once  . allopurinol  100 mg Oral Daily  . cholecalciferol  1,000 Units Oral Daily  . epoetin (EPOGEN/PROCRIT) injection  10,000 Units Intravenous Once  . fluticasone  2 spray Each Nare QHS  . insulin aspart  0-20 Units Subcutaneous TID WC  . insulin aspart  0-5 Units Subcutaneous QHS  . metoprolol tartrate  12.5 mg Oral BID  . midodrine  10 mg Oral TID WC  . pantoprazole  40 mg Oral QAC breakfast  . polyethylene glycol  17 g Oral Daily  . QUEtiapine  25 mg Oral QHS  . sevelamer carbonate  0.8 g Oral BID BM  . sevelamer carbonate  1.6 g Oral TID WC  . sodium chloride flush  10-40 mL Intracatheter Q12H  . sodium chloride flush  3 mL Intravenous Q12H  . sodium chloride flush  3 mL Intravenous Q12H   sodium chloride, acetaminophen **OR** acetaminophen, haloperidol, ipratropium-albuterol, nystatin cream, ondansetron **OR** ondansetron (ZOFRAN) IV, senna, sodium chloride flush, sodium chloride flush  Assessment/ Plan:  82 y.o. white female with end-stage renal disease on hemodialysis, diabetes mellitus type 2, hypertension, obstructive sleep apnea, atrial fibrillation, arthritis, anemia admitted on 09/09/2016 for Hyperkalemia [E87.5] Hyponatremia [E87.1] Weakness [R53.1] Hypoxia [R09.02] Closed nondisplaced fracture of lateral condyle of right femur, initial encounter ([place]) [S72.424A]   MWF [place]  1.  End-stage renal disease with hyperkalemia: hemodialysis for later today.  IV Albumin and midodrine with treatment. UF goal of 1.5-2 litres.  - monitor daily for dailysis need. - Low potassium diet.   2.  Hypotension:  - midodrine  - IV albumin with HD treatment  3.  Anemia of chronic kidney disease - EPO with dialysis  4.  Secondary hyperparathyroidism:  Phosphorus and calcium at goal.  -  sevelamer with meals.    LOS: Rafael Capo,  [place] 10/7/201710:05 AM

## 2016-09-18 NOTE — Progress Notes (Signed)
Post dialysis 

## 2016-09-19 ENCOUNTER — Inpatient Hospital Stay: Payer: Medicare Other

## 2016-09-19 LAB — GLUCOSE, CAPILLARY
GLUCOSE-CAPILLARY: 171 mg/dL — AB (ref 65–99)
Glucose-Capillary: 247 mg/dL — ABNORMAL HIGH (ref 65–99)
Glucose-Capillary: 252 mg/dL — ABNORMAL HIGH (ref 65–99)
Glucose-Capillary: 205 mg/dL — ABNORMAL HIGH (ref 65–99)

## 2016-09-19 MED ORDER — TRAMADOL HCL 50 MG PO TABS
50.0000 mg | ORAL_TABLET | Freq: Four times a day (QID) | ORAL | Status: DC | PRN
Start: 2016-09-19 — End: 2016-09-21
  Administered 2016-09-19 – 2016-09-20 (×4): 50 mg via ORAL
  Filled 2016-09-19 (×4): qty 1

## 2016-09-19 NOTE — Care Management Important Message (Signed)
Important Message  Patient Details  Name: [name] MRN: [id number] Date of Birth: November 30, 1934   Medicare Important Message Given:  Yes    Annelie Boak A, RN 09/19/2016, 12:50 PM

## 2016-09-19 NOTE — Progress Notes (Signed)
[facility] [place] NAME: [name]    MR#:  [id number]  DATE OF BIRTH:  1934-04-06  SUBJECTIVE:  CHIEF COMPLAINT:   Chief Complaint  Patient presents with  . Fatigue   - Very sleepy today. States she didn't sleep well at all last night due to family visiting. -Complains of right hip pain. Had dialysis yesterday - husband at bedside  REVIEW OF SYSTEMS:  Review of Systems  Constitutional: Positive for malaise/fatigue. Negative for chills and fever.  HENT: Negative for ear discharge, ear pain and nosebleeds.   Eyes: Negative for blurred vision.  Respiratory: Positive for cough. Negative for shortness of breath and wheezing.   Cardiovascular: Positive for leg swelling. Negative for chest pain and palpitations.  Gastrointestinal: Negative for abdominal pain, constipation, diarrhea, nausea and vomiting.  Genitourinary: Negative for dysuria and urgency.  Musculoskeletal: Negative for myalgias.  Neurological: Negative for dizziness, sensory change, speech change, focal weakness, seizures and headaches.  Psychiatric/Behavioral: Negative for depression.    DRUG ALLERGIES:   Allergies  Allergen Reactions  . Angiotensin Receptor Blockers Other (See Comments)    Reaction:  Hyperkalemia  . Penicillins Rash and Other (See Comments)    rash    VITALS:  Blood pressure (!) 113/49, pulse (!) 114, temperature 98 F (36.7 C), temperature source Axillary, resp. rate 16, height 5\' 1"  (1.549 m), weight 103.8 kg (228 lb 13.4 oz), SpO2 99 %.  PHYSICAL EXAMINATION:  Physical Exam  GENERAL:  82 y.o.-year-old obese patient lying in the bed with no acute distress. Very sleepy, on CPAP EYES: Pupils equal, round, reactive to light and accommodation. No scleral icterus. Extraocular muscles intact.  HEENT: Head atraumatic, normocephalic. Oropharynx and nasopharynx clear.  NECK:  Supple, no jugular venous distention. No thyroid enlargement, no  tenderness.  LUNGS: Normal breath sounds bilaterally, Decreased basilar breath sounds. no wheezing, rales,rhonchi or crepitation. No use of accessory muscles of respiration.  CARDIOVASCULAR: S1, S2 normal. No murmurs, rubs, or gallops.  ABDOMEN: Soft, nontender, nondistended. Bowel sounds present. No organomegaly or mass.  EXTREMITIES: No  cyanosis, or clubbing. 3+ edema of both lower extremities noted NEUROLOGIC: Cranial nerves II through XII are intact. Muscle strength 5/5 in all extremities. Sensation intact. Gait not checked.  PSYCHIATRIC: The patient is alert and oriented .  SKIN: No obvious rash, lesion, or ulcer.    LABORATORY PANEL:   CBC  Recent Labs Lab 09/18/16 1540  WBC 14.2*  HGB 10.5*  HCT 31.6*  PLT 76*   ------------------------------------------------------------------------------------------------------------------  Chemistries   Recent Labs Lab 09/13/16 0415  09/18/16 1540  NA 134*  < > 133*  K 3.7  < > 4.1  CL 95*  < > 96*  CO2 29  < > 29  GLUCOSE 161*  < > 279*  BUN 29*  < > 30*  CREATININE 2.82*  < > 4.47*  CALCIUM 8.4*  < > 8.2*  MG 1.8  --   --   < > = values in this interval not displayed. ------------------------------------------------------------------------------------------------------------------  Cardiac Enzymes No results for input(s): TROPONINI in the last 168 hours. ------------------------------------------------------------------------------------------------------------------  RADIOLOGY:  Dg Chest Port 1 View  Result Date: 09/19/2016 CLINICAL DATA:  End-stage renal disease. Recent pneumonia. Hypertension. EXAM: PORTABLE CHEST 1 VIEW COMPARISON:  September 11, 2016 FINDINGS: There is patchy atelectasis in the right base, stable. No new opacity. There is cardiomegaly with mild pulmonary venous hypertension. No adenopathy. There is atherosclerotic calcification in the aorta.  Central catheter has been removed. No pneumothorax.  IMPRESSION: Stable atelectasis right base. No new opacity. Stable cardiomegaly with pulmonary venous hypertension indicative of a degree of pulmonary vascular congestion. Aortic atherosclerosis. No pneumothorax. Electronically Signed   By: Lowella Grip III M.D.   On: 09/19/2016 07:36    EKG:   Orders placed or performed during the hospital encounter of 09/09/16  . EKG 12-Lead  . EKG 12-Lead  . EKG 12-Lead  . EKG 12-Lead  . EKG 12-Lead  . EKG 12-Lead    ASSESSMENT AND PLAN:   82 year old female with end-stage renal disease on hemodialysis, diabetes, hypertension, sleep apnea, atrial fibrillation and arthritis admitted with fall and right femoral fracture.  #1 right femoral lateral condyle fracture-not a surgical candidate. -Appreciate orthopedics consult. Nonweightbearing at this time.  In an immobiliser -on tylenol and tramadol added today for pain as needs to work with PT  #2 acute metabolic encephalopathy secondary to in-hospital delirium. Mental status is much better. Avoid narcotics. Monitor as tramadol started for moderate and severe pain  #3 sepsis with multiorgan failure on admission-secondary to pneumonia. Follow-up chest x-ray with right base atelectasis only, no new infiltrate - encourage incentive spirometry. -added Mucinex. -Blood pressure is more stable. Finished antibiotics  #4 history of atrial fibrillation-continue metoprolol. -Not on any anticoagulation due to thrombocytopenia and anemia  #5 end-stage renal disease on hemodialysis-on Monday Wednesday Friday hemodialysis. -Appreciate nephrology consult -Significant edema. IV albumin and midodrine with hemodialysis treatments.  #6 GERD-Protonix  #7 DVT prophylaxis-heparin discontinued due to thrombocytopenia. Monitor  Patient and family refused LTAC. At this time plan is to discharge to rehabilitation. Family is hoping that they can take the patient home if she improves.   All the records are reviewed  and case discussed with Care Management/Social Workerr. Management plans discussed with the patient, family and they are in agreement.  CODE STATUS: DNR  TOTAL TIME TAKING CARE OF THIS PATIENT: 37 minutes.   POSSIBLE D/C IN 1-2 DAYS, DEPENDING ON CLINICAL CONDITION.   Brit Carbonell M.D on 09/19/2016 at 11:13 AM  Between 7am to 6pm - Pager - [phone]  After 6pm go to [email or web address] - password [email or web address] [facility]  Office  [phone]  CC: Primary care physician; Rusty Aus, MD

## 2016-09-19 NOTE — Progress Notes (Signed)
[facility]  ROUNDING NOTE   Subjective:   Hemodialysis yesterday with IV albumin and midodrine before treatment. UF goal of 1.5 litres  Patient laying in bed. States she did not sleep last night.   Objective:  Vital signs in last 24 hours:  Temp:  [97.6 F (36.4 C)-99.1 F (37.3 C)] 98 F (36.7 C) (10/08 0733) Pulse Rate:  [110-118] 114 (10/08 0733) Resp:  [16-26] 16 (10/08 0733) BP: (102-123)/(46-71) 113/49 (10/08 0733) SpO2:  [97 %-100 %] 99 % (10/08 0733) Weight:  [103.8 kg (228 lb 13.4 oz)-105.7 kg (233 lb 0.4 oz)] 103.8 kg (228 lb 13.4 oz) (10/07 1850)  Weight change: 1.291 kg (2 lb 13.5 oz) Filed Weights   09/18/16 0453 09/18/16 1538 09/18/16 1850  Weight: 104.4 kg (230 lb 2.9 oz) 105.7 kg (233 lb 0.4 oz) 103.8 kg (228 lb 13.4 oz)    Intake/Output: I/O last 3 completed shifts: In: -  Out: 1500 [Other:1500]   Intake/Output this shift:  No intake/output data recorded.  Physical Exam: General: ill appearing.   Head: Normocephalic, atraumatic  Eyes: Anicteric  Neck: Supple, trachea midline  Lungs:  CPAP  Heart: Tachycardia, irregular  Abdomen:  Soft, nontender, BS present   Extremities: Right leg in an immobilizer, 1+ peripheral edema  Neurologic: Alert and oriented.   Skin: No lesions  Access: LUE AVF    Basic Metabolic Panel:  Recent Labs Lab 09/12/16 1706 09/13/16 0415 09/18/16 0324 09/18/16 1540  NA  --  134* 136 133*  K  --  3.7 3.9 4.1  CL  --  95* 97* 96*  CO2  --  29 31 29   GLUCOSE  --  161* 234* 279*  BUN  --  29* 28* 30*  CREATININE  --  2.82* 4.05* 4.47*  CALCIUM  --  8.4* 8.3* 8.2*  MG  --  1.8  --   --   PHOS 6.4* 4.2  --  2.2*    Liver Function Tests:  Recent Labs Lab 09/18/16 1540  ALBUMIN 2.7*   No results for input(s): LIPASE, AMYLASE in the last 168 hours. No results for input(s): AMMONIA in the last 168 hours.  CBC:  Recent Labs Lab 09/13/16 0415 09/14/16 0508 09/17/16 0323 09/18/16 0324  09/18/16 1540  WBC 13.5* 14.0* 12.8* 13.5* 14.2*  HGB 10.3* 10.6* 10.9* 10.3* 10.5*  HCT 30.4* 31.6* 32.2* 31.8* 31.6*  MCV 99.8 101.0* 102.1* 103.8* 103.0*  PLT 112* 108* 82* 74* 76*    Cardiac Enzymes: No results for input(s): CKTOTAL, CKMB, CKMBINDEX, TROPONINI in the last 168 hours.  BNP: Invalid input(s): POCBNP  CBG:  Recent Labs Lab 09/17/16 2119 09/18/16 0748 09/18/16 1127 09/18/16 2144 09/19/16 0734  GLUCAP 226* 215* 232* 198* 171*    Microbiology: Results for orders placed or performed during the hospital encounter of 09/09/16  MRSA PCR Screening     Status: Abnormal   Collection Time: 09/10/16  1:47 AM  Result Value Ref Range Status   MRSA by PCR POSITIVE (A) NEGATIVE Final    Comment:        The GeneXpert MRSA Assay (FDA approved for NASAL specimens only), is one component of a comprehensive MRSA colonization surveillance program. It is not intended to diagnose MRSA infection nor to guide or monitor treatment for MRSA infections. RESULT CALLED TO, READ BACK BY AND VERIFIED WITH: MONIQUE ROBERSON ON 09/10/16 AT 0359 BY TLB   CULTURE, BLOOD (ROUTINE X 2) w Reflex to ID Panel  Status: None   Collection Time: 09/10/16  8:19 AM  Result Value Ref Range Status   Specimen Description BLOOD RIGHT HAND  Final   Special Requests BOTTLES DRAWN AEROBIC AND ANAEROBIC .5ML  Final   Culture NO GROWTH 5 DAYS  Final   Report Status 09/15/2016 FINAL  Final  CULTURE, BLOOD (ROUTINE X 2) w Reflex to ID Panel     Status: None   Collection Time: 09/10/16  6:04 PM  Result Value Ref Range Status   Specimen Description BLOOD RIGHT HAND  Final   Special Requests BOTTLES DRAWN AEROBIC AND ANAEROBIC .5ML  Final   Culture NO GROWTH 5 DAYS  Final   Report Status 09/15/2016 FINAL  Final    Coagulation Studies: No results for input(s): LABPROT, INR in the last 72 hours.  Urinalysis: No results for input(s): COLORURINE, LABSPEC, PHURINE, GLUCOSEU, HGBUR, BILIRUBINUR,  KETONESUR, PROTEINUR, UROBILINOGEN, NITRITE, LEUKOCYTESUR in the last 72 hours.  Invalid input(s): APPERANCEUR    Imaging: Dg Chest Port 1 View  Result Date: 09/19/2016 CLINICAL DATA:  End-stage renal disease. Recent pneumonia. Hypertension. EXAM: PORTABLE CHEST 1 VIEW COMPARISON:  September 11, 2016 FINDINGS: There is patchy atelectasis in the right base, stable. No new opacity. There is cardiomegaly with mild pulmonary venous hypertension. No adenopathy. There is atherosclerotic calcification in the aorta. Central catheter has been removed. No pneumothorax. IMPRESSION: Stable atelectasis right base. No new opacity. Stable cardiomegaly with pulmonary venous hypertension indicative of a degree of pulmonary vascular congestion. Aortic atherosclerosis. No pneumothorax. Electronically Signed   By: Lowella Grip III M.D.   On: 09/19/2016 07:36     Medications:     . allopurinol  100 mg Oral Daily  . cholecalciferol  1,000 Units Oral Daily  . fluticasone  2 spray Each Nare QHS  . guaiFENesin  600 mg Oral BID  . insulin aspart  0-20 Units Subcutaneous TID WC  . insulin aspart  0-5 Units Subcutaneous QHS  . metoprolol tartrate  12.5 mg Oral BID  . midodrine  10 mg Oral TID WC  . pantoprazole  40 mg Oral QAC breakfast  . polyethylene glycol  17 g Oral Daily  . QUEtiapine  25 mg Oral QHS  . sevelamer carbonate  0.8 g Oral BID BM  . sevelamer carbonate  1.6 g Oral TID WC  . sodium chloride flush  10-40 mL Intracatheter Q12H  . sodium chloride flush  3 mL Intravenous Q12H  . sodium chloride flush  3 mL Intravenous Q12H   sodium chloride, acetaminophen **OR** acetaminophen, haloperidol, ipratropium-albuterol, nystatin cream, ondansetron **OR** ondansetron (ZOFRAN) IV, senna, sodium chloride flush, sodium chloride flush  Assessment/ Plan:  82 y.o. white female with end-stage renal disease on hemodialysis, diabetes mellitus type 2, hypertension, obstructive sleep apnea, atrial fibrillation,  arthritis, anemia admitted on 09/09/2016 for Hyperkalemia [E87.5] Hyponatremia [E87.1] Weakness [R53.1] Hypoxia [R09.02] Closed nondisplaced fracture of lateral condyle of right femur, initial encounter ([place]) [S72.424A]   MWF [place]  1.  End-stage renal disease with hyperkalemia: hemodialysis for tomorrow IV Albumin and midodrine with treatment.  - orders prepared.  - monitor daily for dailysis need. - Low potassium diet.   2.  Hypotension:  - midodrine  - IV albumin with HD treatment  3.  Anemia of chronic kidney disease - EPO with dialysis  4.  Secondary hyperparathyroidism:  Phosphorus and calcium at goal.  -  sevelamer with meals.   5. Right femur fracture: not a surgical candidate. Placed in  an immobilizer. Dr. Sabra Heck following.    LOS: [place], [place] 10/8/20179:38 AM

## 2016-09-20 LAB — BASIC METABOLIC PANEL
Anion gap: 8 (ref 5–15)
BUN: 22 mg/dL — ABNORMAL HIGH (ref 6–20)
CHLORIDE: 95 mmol/L — AB (ref 101–111)
CO2: 30 mmol/L (ref 22–32)
CREATININE: 3.63 mg/dL — AB (ref 0.44–1.00)
Calcium: 8.4 mg/dL — ABNORMAL LOW (ref 8.9–10.3)
GFR calc non Af Amer: 11 mL/min — ABNORMAL LOW (ref >60)
GFR, EST AFRICAN AMERICAN: 13 mL/min — AB (ref >60)
Glucose, Bld: 155 mg/dL — ABNORMAL HIGH (ref 65–99)
POTASSIUM: 4.3 mmol/L (ref 3.5–5.1)
SODIUM: 133 mmol/L — AB (ref 135–145)

## 2016-09-20 LAB — CBC
HEMATOCRIT: 32.6 % — AB (ref 35.0–47.0)
Hemoglobin: 10.6 g/dL — ABNORMAL LOW (ref 12.0–16.0)
MCH: 33.9 pg (ref 26.0–34.0)
MCHC: 32.5 g/dL (ref 32.0–36.0)
MCV: 104.2 fL — AB (ref 80.0–100.0)
Platelets: 65 10*3/uL — ABNORMAL LOW (ref 150–440)
RBC: 3.13 MIL/uL — ABNORMAL LOW (ref 3.80–5.20)
RDW: 18.9 % — AB (ref 11.5–14.5)
WBC: 12.9 10*3/uL — AB (ref 3.6–11.0)

## 2016-09-20 LAB — GLUCOSE, CAPILLARY
GLUCOSE-CAPILLARY: 157 mg/dL — AB (ref 65–99)
Glucose-Capillary: 247 mg/dL — ABNORMAL HIGH (ref 65–99)
Glucose-Capillary: 215 mg/dL — ABNORMAL HIGH (ref 65–99)
Glucose-Capillary: 125 mg/dL — ABNORMAL HIGH (ref 65–99)

## 2016-09-20 NOTE — Care Management Important Message (Signed)
Important Message  Patient Details  Name: [name] MRN: [id number] Date of Birth: 11/15/1934   Medicare Important Message Given:  Yes    Jolly Mango, RN 09/20/2016, 9:50 AM

## 2016-09-20 NOTE — Progress Notes (Signed)
Pre hd assessment  

## 2016-09-20 NOTE — Progress Notes (Signed)
Clinical Social Worker (CSW) presented bed offers to patient's husband Breniyah Laurita at bedside. [facility]. [facility] liaison is aware of accepted bed offer. CSW will continue to follow and assist as needed.   [employer], LCSW [phone]

## 2016-09-20 NOTE — Progress Notes (Signed)
PT Cancellation Note  Patient Details Name: [name] MRN: [id number] DOB: 1934/02/20   Cancelled Treatment:    Reason Eval/Treat Not Completed: Patient declined, no reason specified;Other (comment). Treatment attempted; pt and spouse decline at this time, as pt is awaiting to attempt hoyer lift and tolerance of up in chair during in room dialysis this afternoon. Spouse/pt note she is very uncomfortable sitting up in bed and do not want to do anything that may make her more uncomfortable at this time. Re attempt treatment at a later date.    Larae Grooms, PTA 09/20/2016, 2:43 PM

## 2016-09-20 NOTE — Progress Notes (Signed)
Hd start 

## 2016-09-20 NOTE — Progress Notes (Signed)
Post hd assessment 

## 2016-09-20 NOTE — Progress Notes (Signed)
Post hd vitals, report to RN.

## 2016-09-20 NOTE — Progress Notes (Signed)
Pre hd info 

## 2016-09-20 NOTE — Progress Notes (Signed)
This was a follow-up visit with the patient whom the chaplain had visited sometime last week. Chaplain visited to see how the patient was doing, but he found the patient sleeping. Ch talked to the patient's husband and promised to visit the patient later.    09/20/16 1400  Clinical Encounter Type  Visited With Patient  Visit Type Follow-up;Spiritual support  Spiritual Encounters  Spiritual Needs Prayer

## 2016-09-20 NOTE — Progress Notes (Signed)
[facility]  ROUNDING NOTE   Subjective:   Patient is doing fair today Denying any acute complaints Able to eat some without nausea or vomiting Scheduled for dialysis today  Objective:  Vital signs in last 24 hours:  Temp:  [98 F (36.7 C)-98.4 F (36.9 C)] 98 F (36.7 C) (10/09 0423) Pulse Rate:  [109-110] 110 (10/09 0423) Resp:  [18-20] 20 (10/09 0423) BP: (96-108)/(47-52) 96/47 (10/09 0423) SpO2:  [98 %-100 %] 98 % (10/09 0423) Weight:  [105.4 kg (232 lb 6.4 oz)] 105.4 kg (232 lb 6.4 oz) (10/09 0423)  Weight change: -0.284 kg (-10 oz) Filed Weights   09/18/16 1538 09/18/16 1850 09/20/16 0423  Weight: 105.7 kg (233 lb 0.4 oz) 103.8 kg (228 lb 13.4 oz) 105.4 kg (232 lb 6.4 oz)    Intake/Output: No intake/output data recorded.   Intake/Output this shift:  Total I/O In: 120 [P.O.:120] Out: -   Physical Exam: General: ill appearing.   Head: Normocephalic, atraumatic  Eyes: Anicteric  Neck: Supple, trachea midline  Lungs:  Mild scattered rhonchi   Heart: Tachycardia, irregular  Abdomen:  Soft, nontender, BS present   Extremities: Right leg in an immobilizer, 1+ peripheral edema  Neurologic: Alert and oriented.   Skin: No lesions  Access: LUE AVF    Basic Metabolic Panel:  Recent Labs Lab 09/18/16 0324 09/18/16 1540 09/20/16 0330  NA 136 133* 133*  K 3.9 4.1 4.3  CL 97* 96* 95*  CO2 31 29 30   GLUCOSE 234* 279* 155*  BUN 28* 30* 22*  CREATININE 4.05* 4.47* 3.63*  CALCIUM 8.3* 8.2* 8.4*  PHOS  --  2.2*  --     Liver Function Tests:  Recent Labs Lab 09/18/16 1540  ALBUMIN 2.7*   No results for input(s): LIPASE, AMYLASE in the last 168 hours. No results for input(s): AMMONIA in the last 168 hours.  CBC:  Recent Labs Lab 09/14/16 0508 09/17/16 0323 09/18/16 0324 09/18/16 1540  WBC 14.0* 12.8* 13.5* 14.2*  HGB 10.6* 10.9* 10.3* 10.5*  HCT 31.6* 32.2* 31.8* 31.6*  MCV 101.0* 102.1* 103.8* 103.0*  PLT 108* 82* 74* 76*     Cardiac Enzymes: No results for input(s): CKTOTAL, CKMB, CKMBINDEX, TROPONINI in the last 168 hours.  BNP: Invalid input(s): POCBNP  CBG:  Recent Labs Lab 09/19/16 1116 09/19/16 1632 09/19/16 2151 09/20/16 0835 09/20/16 1139  GLUCAP 247* 252* 205* 157* 247*    Microbiology: Results for orders placed or performed during the hospital encounter of 09/09/16  MRSA PCR Screening     Status: Abnormal   Collection Time: 09/10/16  1:47 AM  Result Value Ref Range Status   MRSA by PCR POSITIVE (A) NEGATIVE Final    Comment:        The GeneXpert MRSA Assay (FDA approved for NASAL specimens only), is one component of a comprehensive MRSA colonization surveillance program. It is not intended to diagnose MRSA infection nor to guide or monitor treatment for MRSA infections. RESULT CALLED TO, READ BACK BY AND VERIFIED WITH: MONIQUE ROBERSON ON 09/10/16 AT 0359 BY TLB   CULTURE, BLOOD (ROUTINE X 2) w Reflex to ID Panel     Status: None   Collection Time: 09/10/16  8:19 AM  Result Value Ref Range Status   Specimen Description BLOOD RIGHT HAND  Final   Special Requests BOTTLES DRAWN AEROBIC AND ANAEROBIC .5ML  Final   Culture NO GROWTH 5 DAYS  Final   Report Status 09/15/2016 FINAL  Final  CULTURE, BLOOD (ROUTINE X 2) w Reflex to ID Panel     Status: None   Collection Time: 09/10/16  6:04 PM  Result Value Ref Range Status   Specimen Description BLOOD RIGHT HAND  Final   Special Requests BOTTLES DRAWN AEROBIC AND ANAEROBIC .5ML  Final   Culture NO GROWTH 5 DAYS  Final   Report Status 09/15/2016 FINAL  Final    Coagulation Studies: No results for input(s): LABPROT, INR in the last 72 hours.  Urinalysis: No results for input(s): COLORURINE, LABSPEC, PHURINE, GLUCOSEU, HGBUR, BILIRUBINUR, KETONESUR, PROTEINUR, UROBILINOGEN, NITRITE, LEUKOCYTESUR in the last 72 hours.  Invalid input(s): APPERANCEUR    Imaging: Dg Chest Port 1 View  Result Date: 09/19/2016 CLINICAL DATA:   End-stage renal disease. Recent pneumonia. Hypertension. EXAM: PORTABLE CHEST 1 VIEW COMPARISON:  September 11, 2016 FINDINGS: There is patchy atelectasis in the right base, stable. No new opacity. There is cardiomegaly with mild pulmonary venous hypertension. No adenopathy. There is atherosclerotic calcification in the aorta. Central catheter has been removed. No pneumothorax. IMPRESSION: Stable atelectasis right base. No new opacity. Stable cardiomegaly with pulmonary venous hypertension indicative of a degree of pulmonary vascular congestion. Aortic atherosclerosis. No pneumothorax. Electronically Signed   By: Lowella Grip III M.D.   On: 09/19/2016 07:36     Medications:     . allopurinol  100 mg Oral Daily  . cholecalciferol  1,000 Units Oral Daily  . fluticasone  2 spray Each Nare QHS  . guaiFENesin  600 mg Oral BID  . insulin aspart  0-20 Units Subcutaneous TID WC  . insulin aspart  0-5 Units Subcutaneous QHS  . metoprolol tartrate  12.5 mg Oral BID  . midodrine  10 mg Oral TID WC  . pantoprazole  40 mg Oral QAC breakfast  . polyethylene glycol  17 g Oral Daily  . QUEtiapine  25 mg Oral QHS  . sevelamer carbonate  0.8 g Oral BID BM  . sevelamer carbonate  1.6 g Oral TID WC  . sodium chloride flush  10-40 mL Intracatheter Q12H  . sodium chloride flush  3 mL Intravenous Q12H  . sodium chloride flush  3 mL Intravenous Q12H   sodium chloride, acetaminophen **OR** acetaminophen, haloperidol, ipratropium-albuterol, nystatin cream, ondansetron **OR** ondansetron (ZOFRAN) IV, senna, sodium chloride flush, sodium chloride flush, traMADol  Assessment/ Plan:  82 y.o. white female with end-stage renal disease on hemodialysis, diabetes mellitus type 2, hypertension, obstructive sleep apnea, atrial fibrillation, arthritis, anemia admitted on 09/09/2016 for Hyperkalemia [E87.5] Hyponatremia [E87.1] Weakness [R53.1] Hypoxia [R09.02] Closed nondisplaced fracture of lateral condyle of right  femur, initial encounter ([place]) [S72.424A]   MWF [place]  1.  End-stage renal disease with hyperkalemia: hemodialysis for tomorrow - IV Albumin prn and midodrine with treatment.  - Dialysis in the chair via [facility] lift to prepare for outpatient dialysis.  - Low potassium diet.   2.  Hypotension:  - midodrine  - IV albumin with HD treatment prn  3.  Anemia of chronic kidney disease - EPO with dialysis  4.  Secondary hyperparathyroidism:  Phosphorus and calcium at goal.  -  sevelamer with meals.   5. Right femur fracture:   impacted right distal femur fx just above her TKR not a surgical candidate. Placed in an immobilizer.    LOS: [place] 10/9/201712:47 PM

## 2016-09-20 NOTE — Progress Notes (Signed)
  End of hd 

## 2016-09-20 NOTE — Progress Notes (Signed)
[facility] [place] NAME: [name]    MR#:  [id number]  DATE OF BIRTH:  August 14, 1934  SUBJECTIVE:  CHIEF COMPLAINT:   Chief Complaint  Patient presents with  . Fatigue   - appears better than yesterday, complains of pain in right hip/leg - for dialysis today - husband at bedside  REVIEW OF SYSTEMS:  Review of Systems  Constitutional: Positive for malaise/fatigue. Negative for chills and fever.  HENT: Negative for ear discharge, ear pain and nosebleeds.   Eyes: Negative for blurred vision.  Respiratory: Positive for cough. Negative for shortness of breath and wheezing.   Cardiovascular: Positive for leg swelling. Negative for chest pain and palpitations.  Gastrointestinal: Negative for abdominal pain, constipation, diarrhea, nausea and vomiting.  Genitourinary: Negative for dysuria and urgency.  Musculoskeletal: Negative for myalgias.  Neurological: Negative for dizziness, sensory change, speech change, focal weakness, seizures and headaches.  Psychiatric/Behavioral: Negative for depression.    DRUG ALLERGIES:   Allergies  Allergen Reactions  . Angiotensin Receptor Blockers Other (See Comments)    Reaction:  Hyperkalemia  . Penicillins Rash and Other (See Comments)    rash    VITALS:  Blood pressure (!) 96/47, pulse (!) 110, temperature 98 F (36.7 C), temperature source Oral, resp. rate 20, height 5\' 1"  (1.549 m), weight 105.4 kg (232 lb 6.4 oz), SpO2 98 %.  PHYSICAL EXAMINATION:  Physical Exam  GENERAL:  82 y.o.-year-old obese patient lying in the bed with no acute distress. Very sleepy, on CPAP EYES: Pupils equal, round, reactive to light and accommodation. No scleral icterus. Extraocular muscles intact.  HEENT: Head atraumatic, normocephalic. Oropharynx and nasopharynx clear.  NECK:  Supple, no jugular venous distention. No thyroid enlargement, no tenderness.  LUNGS: Normal breath sounds bilaterally, Decreased  basilar breath sounds. no wheezing, rales,rhonchi or crepitation. No use of accessory muscles of respiration.  CARDIOVASCULAR: S1, S2 normal. No murmurs, rubs, or gallops.  ABDOMEN: Soft, nontender, nondistended. Bowel sounds present. No organomegaly or mass.  EXTREMITIES: No  cyanosis, or clubbing. 3+ edema of both lower extremities noted NEUROLOGIC: Cranial nerves II through XII are intact. Muscle strength 5/5 in all extremities. Sensation intact. Gait not checked.  PSYCHIATRIC: The patient is alert and oriented .  SKIN: No obvious rash, lesion, or ulcer.    LABORATORY PANEL:   CBC  Recent Labs Lab 09/18/16 1540  WBC 14.2*  HGB 10.5*  HCT 31.6*  PLT 76*   ------------------------------------------------------------------------------------------------------------------  Chemistries   Recent Labs Lab 09/20/16 0330  NA 133*  K 4.3  CL 95*  CO2 30  GLUCOSE 155*  BUN 22*  CREATININE 3.63*  CALCIUM 8.4*   ------------------------------------------------------------------------------------------------------------------  Cardiac Enzymes No results for input(s): TROPONINI in the last 168 hours. ------------------------------------------------------------------------------------------------------------------  RADIOLOGY:  Dg Chest Port 1 View  Result Date: 09/19/2016 CLINICAL DATA:  End-stage renal disease. Recent pneumonia. Hypertension. EXAM: PORTABLE CHEST 1 VIEW COMPARISON:  September 11, 2016 FINDINGS: There is patchy atelectasis in the right base, stable. No new opacity. There is cardiomegaly with mild pulmonary venous hypertension. No adenopathy. There is atherosclerotic calcification in the aorta. Central catheter has been removed. No pneumothorax. IMPRESSION: Stable atelectasis right base. No new opacity. Stable cardiomegaly with pulmonary venous hypertension indicative of a degree of pulmonary vascular congestion. Aortic atherosclerosis. No pneumothorax. Electronically  Signed   By: Lowella Grip III M.D.   On: 09/19/2016 07:36    EKG:   Orders placed or performed during the hospital  encounter of 09/09/16  . EKG 12-Lead  . EKG 12-Lead  . EKG 12-Lead  . EKG 12-Lead  . EKG 12-Lead  . EKG 12-Lead    ASSESSMENT AND PLAN:   82 year old female with end-stage renal disease on hemodialysis, diabetes, hypertension, sleep apnea, atrial fibrillation and arthritis admitted with fall and right femoral fracture.  #1 Right femoral lateral condyle fracture-not a surgical candidate. -Appreciate orthopedics consult. Nonweightbearing at this time.  In an immobiliser -on tylenol and tramadol added for pain as needs to work with PT  #2 acute metabolic encephalopathy secondary to in-hospital delirium. Mental status is much better. Avoid narcotics. Monitor as tramadol started for moderate and severe pain  #3 sepsis with multiorgan failure on admission-secondary to pneumonia. Follow-up chest x-ray with right base atelectasis only, no new infiltrate - encourage incentive spirometry. -added Mucinex. -Blood pressure is more stable. Finished antibiotics  #4 history of atrial fibrillation-continue metoprolol. -Not on any anticoagulation due to thrombocytopenia and anemia  #5 end-stage renal disease on hemodialysis-on Monday Wednesday Friday hemodialysis. -Appreciate nephrology consult -Significant edema. IV albumin and midodrine with hemodialysis treatments. Due for dialysis today- has to see if able to sit during dialysis today  #6 GERD-Protonix  #7 DVT prophylaxis-heparin discontinued due to thrombocytopenia. Monitor  Monitor to see if patient can sit in chair for dialysis today. If able to, can go to rehab tomorrow, if not LTAC referral.   All the records are reviewed and case discussed with Care Management/Social Workerr. Management plans discussed with the patient, family and they are in agreement.  CODE STATUS: DNR  TOTAL TIME TAKING CARE OF THIS  PATIENT: 37 minutes.   POSSIBLE D/C TOMORROW, DEPENDING ON CLINICAL CONDITION.   Lutie Pickler M.D on 09/20/2016 at 1:21 PM  Between 7am to 6pm - Pager - [phone]  After 6pm go to [email or web address] - password [email or web address] [facility]  Office  [phone]  CC: Primary care physician; Rusty Aus, MD

## 2016-09-20 NOTE — Progress Notes (Signed)
Per RN patient will have dialysis at bedside in her room today and she will use a hoyer lift to place patient in the chair to determine if patient can tolerate sitting up for outpatient dialysis. Clinical Social Worker (CSW) made patient's husband aware of above. CSW explained to husband that if patient cannot tolerate sitting up for the entire dialysis session then LTAC will have to reconsidered. Husband reported that he does not want her to go to [facility] and wants her to go to [facility]. CSW emphasized that will depend on if she can tolerate sitting up for the dialysis session. Husband verbalized his understanding. MD aware of above. CSW will continue to follow and assist as needed.   [employer], LCSW [phone]

## 2016-09-20 NOTE — Progress Notes (Addendum)
Inpatient Diabetes Program Recommendations  AACE/ADA: New Consensus Statement on Inpatient Glycemic Control (2015)  Target Ranges:  Prepandial:   less than 140 mg/dL      [facility] postprandial:   less than 180 mg/dL (1-2 hours)      Critically ill patients:  140 - 180 mg/dL   Results for [name], [name] (MRN [id number]) as of 09/20/2016 08:59  Ref. Range 09/19/2016 07:34 09/19/2016 11:16 09/19/2016 16:32 09/19/2016 21:51  Glucose-Capillary Latest Ref Range: 65 - 99 mg/dL 171 (H) 247 (H) 252 (H) 205 (H)   Results for [name], [name] (MRN [id number]) as of 09/20/2016 08:59  Ref. Range 09/20/2016 08:35  Glucose-Capillary Latest Ref Range: 65 - 99 mg/dL 157 (H)    Admit with: Fatigue  History: DM, CHF, ESRD  Home DM Meds: Novolog 70/30 Mix Insulin- 20 units bidwc  Current Insulin Orders: Novolog Resistant Correction Scale/ SSI (0-20 units) TID AC + HS       -Per Care Everywhere: Pt saw her PCP Dr. Sabra Heck with the [facility] on 07/08/16.  At that visit, patient was instructed to stop the Humalog 75/25 insulin 50 units bid and she was to continue the Novolog 70/30 Mix Insulin at 20 units bidwc.  -Last A1c on file in EPIC was 6.0% on 04/07/16.  -Patient having slightly elevated fasting glucose levels and postprandial glucose levels in the 200 mg/dl range.     MD- Please consider the following in-hospital insulin adjustments:  1. Start 50% of patient's home dose of 70/30 Insulin- 10 units bid with meals (breakfast and supper)  2. Reduce Novolog Correction Scale/ SSI to Moderate scale (0-15 units) TID AC + HS (currently ordered as Resistant scale 0-20 units)       --Will follow patient during hospitalization--  Wyn Quaker RN, MSN, CDE Diabetes Coordinator Inpatient Glycemic Control Team Team Pager: [phone] (8a-5p)

## 2016-09-21 LAB — GLUCOSE, CAPILLARY
Glucose-Capillary: 148 mg/dL — ABNORMAL HIGH (ref 65–99)
Glucose-Capillary: 236 mg/dL — ABNORMAL HIGH (ref 65–99)

## 2016-09-21 MED ORDER — TRAMADOL HCL 50 MG PO TABS: 50.0000 mg | ORAL_TABLET | Freq: Four times a day (QID) | ORAL | 0 refills | Status: DC | PRN

## 2016-09-21 MED ORDER — QUETIAPINE FUMARATE 25 MG PO TABS: 25.0000 mg | ORAL_TABLET | Freq: Every day | ORAL | 2 refills | Status: DC

## 2016-09-21 MED ORDER — MIDODRINE HCL 10 MG PO TABS: 10.0000 mg | ORAL_TABLET | Freq: Three times a day (TID) | ORAL | 2 refills | Status: DC

## 2016-09-21 MED ORDER — GUAIFENESIN ER 600 MG PO TB12: 600.0000 mg | ORAL_TABLET | Freq: Two times a day (BID) | ORAL | 0 refills | Status: DC

## 2016-09-21 MED ORDER — PROPRANOLOL HCL 20 MG PO TABS: 20.0000 mg | ORAL_TABLET | Freq: Two times a day (BID) | ORAL | 2 refills | Status: DC

## 2016-09-21 MED ORDER — INSULIN GLARGINE 100 UNIT/ML ~~LOC~~ SOLN: 8.0000 [IU] | Freq: Every day | SUBCUTANEOUS | 11 refills | Status: DC

## 2016-09-21 NOTE — Plan of Care (Signed)
Problem: Bowel/Gastric: Goal: Will not experience complications related to bowel motility Outcome: Completed/Met Date Met: 09/21/16 Pt has met goals for discharge.

## 2016-09-21 NOTE — Discharge Summary (Signed)
[place] at [place] NAME: [name]    MR#:  [id number]  DATE OF BIRTH:  05/08/1934  DATE OF ADMISSION:  09/09/2016   ADMITTING PHYSICIAN: Harvie Bridge, DO  DATE OF DISCHARGE: 09/21/2016  PRIMARY CARE PHYSICIAN: Rusty Aus, MD   ADMISSION DIAGNOSIS:   Hyperkalemia [E87.5] Hyponatremia [E87.1] Weakness [R53.1] Hypoxia [R09.02] Closed nondisplaced fracture of lateral condyle of right femur, initial encounter ([place]) [S72.424A]  DISCHARGE DIAGNOSIS:   Active Problems:   Hyponatremia   Closed nondisplaced fracture of lateral condyle of right femur [facility])   Palliative care by specialist   SECONDARY DIAGNOSIS:   Past Medical History:  Diagnosis Date  . A-fib (HCC)    not on anticoagulation  . Anemia   . Bilateral lower extremity edema   . CHF (congestive heart failure) ([place])   . Diabetes mellitus without complication [facility])    Patient takes Insulin  . Dysrhythmia   . ESRD (end stage renal disease) ([place])    Monday, wednesday, Friday DIalysis  . Essential tremor   . HTN (hypertension)   . OSA (obstructive sleep apnea)   . Osteoarthritis   . Renal insufficiency    Patient is on dialysis and normal days are M,W and F.  . Skin cancer    Resected from legs    HOSPITAL COURSE:   82 year old female with end-stage renal disease on hemodialysis, diabetes, hypertension, sleep apnea, atrial fibrillation and arthritis admitted with fall and right femoral fracture.  #1 right femoral lateral condyle fracture-not a surgical candidate. -Appreciate orthopedics consult. Nonweightbearing at this time.  In an immobiliser -on tylenol and tramadol added for pain as needs to work with PT -In bed exercises. Will need hoyer lift to move her  #2 acute metabolic encephalopathy secondary to in-hospital delirium. Mental status is much better. Avoid narcotics.  tramadol started for moderate and severe pain -Completely resolved.  #3  sepsis with multiorgan failure on admission-secondary to pneumonia. Follow-up chest x-ray with right base atelectasis only, no new infiltrate - encourage incentive spirometry. -added Mucinex. -Blood pressure is stable. Finished antibiotics - cont neb treatments and also on CPAP at bedtime  #4 history of atrial fibrillation-on low-dose propranolol as blood pressure is low. Once blood pressure is improved, can increase the dose. -Not on any anticoagulation due to thrombocytopenia and anemia  #5 end-stage renal disease on hemodialysis-on Monday Wednesday Friday hemodialysis. -Appreciate nephrology consult -Significant edema. IV albumin and midodrine with hemodialysis treatments. Last dialysis yesterday. Dialysis per schedule tomorrow  #6 GERD-Protonix  Discharge to rehab today   DISCHARGE CONDITIONS:   Guarded  CONSULTS OBTAINED:   Treatment Team:  Anthonette Legato, MD [place], MD Gladstone Lighter, MD  DRUG ALLERGIES:   Allergies  Allergen Reactions  . Angiotensin Receptor Blockers Other (See Comments)    Reaction:  Hyperkalemia  . Penicillins Rash and Other (See Comments)    rash   DISCHARGE MEDICATIONS:     Medication List    STOP taking these medications   calcium-vitamin D 500-200 MG-UNIT tablet Commonly known as:  OSCAL WITH D   insulin aspart 100 UNIT/ML injection Commonly known as:  novoLOG   insulin lispro protamine-lispro (75-25) 100 UNIT/ML Susp injection Commonly known as:  HUMALOG 75/25 MIX   oxyCODONE-acetaminophen 5-325 MG tablet Commonly known as:  PERCOCET/ROXICET   temazepam 15 MG capsule Commonly known as:  RESTORIL     TAKE these medications   allopurinol 100 MG tablet Commonly known as:  ZYLOPRIM Take 100 mg by mouth daily.   calcium acetate 667 MG capsule Commonly known as:  PHOSLO Take 1 capsule (667 mg total) by mouth 2 (two) times daily between meals.   cholecalciferol 1000 units tablet Commonly known as:  VITAMIN  D Take 1,000 Units by mouth daily.   docusate sodium 100 MG capsule Commonly known as:  COLACE Take 1 capsule (100 mg total) by mouth 2 (two) times daily.   fluticasone 50 MCG/ACT nasal spray Commonly known as:  FLONASE Place 2 sprays into both nostrils at bedtime.   guaiFENesin 600 MG 12 hr tablet Commonly known as:  MUCINEX Take 1 tablet (600 mg total) by mouth 2 (two) times daily.   insulin glargine 100 UNIT/ML injection Commonly known as:  LANTUS Inject 0.08 mLs (8 Units total) into the skin at bedtime.   ipratropium-albuterol 0.5-2.5 (3) MG/3ML Soln Commonly known as:  DUONEB Take 3 mLs by nebulization 4 (four) times daily as needed (for wheezing/shortness of breath).   midodrine 10 MG tablet Commonly known as:  PROAMATINE Take 1 tablet (10 mg total) by mouth 3 (three) times daily with meals. What changed:  medication strength  how much to take   multivitamin with minerals Tabs tablet Take 1 tablet by mouth daily.   nystatin cream Commonly known as:  MYCOSTATIN Apply 1 application topically 2 (two) times daily as needed (for itching).   pantoprazole 40 MG tablet Commonly known as:  PROTONIX Take 40 mg by mouth daily.   polyethylene glycol packet Commonly known as:  MIRALAX / GLYCOLAX Take 17 g by mouth daily.   promethazine 25 MG tablet Commonly known as:  PHENERGAN Take 25 mg by mouth every 6 (six) hours as needed for nausea or vomiting.   propranolol 20 MG tablet Commonly known as:  INDERAL Take 1 tablet (20 mg total) by mouth 2 (two) times daily. What changed:  medication strength  how much to take   QUEtiapine 25 MG tablet Commonly known as:  SEROQUEL Take 1 tablet (25 mg total) by mouth at bedtime.   RENVELA 800 MG tablet Generic drug:  sevelamer carbonate Take 800-1,600 mg by mouth 5 (five) times daily. Pt takes two tablets three times daily with meals and one tablet two times daily with snacks.   senna 8.6 MG Tabs tablet Commonly known  as:  SENOKOT Take 2 tablets (17.2 mg total) by mouth daily as needed for mild constipation.   traMADol 50 MG tablet Commonly known as:  ULTRAM Take 1 tablet (50 mg total) by mouth every 6 (six) hours as needed for moderate pain or severe pain. What changed:  when to take this  reasons to take this        DISCHARGE INSTRUCTIONS:   1. Orthopedic follow-up in 1-2 weeks 2. Nonweight bearing on the right leg, keep the leg straight while sitting in a chair 3. In bed exercises allowed for strengthening. 4. PCP follow-up in 2 weeks 5. Nephrology follow-up for dialysis tomorrow 6. CPAP at bedtime  DIET:   Cardiac diet  ACTIVITY:   Bedrest  OXYGEN:   Home Oxygen: No.  Oxygen Delivery: room air  DISCHARGE LOCATION:   nursing home   If you experience worsening of your admission symptoms, develop shortness of breath, life threatening emergency, suicidal or homicidal thoughts you must seek medical attention immediately by calling 911 or calling your MD immediately  if symptoms less severe.  You Must read complete instructions/literature along with all the possible adverse  reactions/side effects for all the Medicines you take and that have been prescribed to you. Take any new Medicines after you have completely understood and accpet all the possible adverse reactions/side effects.   Please note  You were cared for by a hospitalist during your hospital stay. If you have any questions about your discharge medications or the care you received while you were in the hospital after you are discharged, you can call the unit and asked to speak with the hospitalist on call if the hospitalist that took care of you is not available. Once you are discharged, your primary care physician will handle any further medical issues. Please note that NO REFILLS for any discharge medications will be authorized once you are discharged, as it is imperative that you return to your primary care physician (or  establish a relationship with a primary care physician if you do not have one) for your aftercare needs so that they can reassess your need for medications and monitor your lab values.    On the day of Discharge:  VITAL SIGNS:   Blood pressure (!) 96/51, pulse (!) 104, temperature 98.2 F (36.8 C), temperature source Oral, resp. rate 18, height 5\' 1"  (1.549 m), weight 104.3 kg (230 lb), SpO2 94 %.  PHYSICAL EXAMINATION:    GENERAL:  82 y.o.-year-old obese patient lying in the bed with no acute distress. Very alert today EYES: Pupils equal, round, reactive to light and accommodation. No scleral icterus. Extraocular muscles intact.  HEENT: Head atraumatic, normocephalic. Oropharynx and nasopharynx clear.  NECK:  Supple, no jugular venous distention. No thyroid enlargement, no tenderness.  LUNGS: Normal breath sounds bilaterally, Decreased basilar breath sounds. no wheezing, rales,rhonchi or crepitation. No use of accessory muscles of respiration.  CARDIOVASCULAR: S1, S2 normal. No murmurs, rubs, or gallops.  ABDOMEN: Soft, obese, nontender, nondistended. Bowel sounds present. No organomegaly or mass.  EXTREMITIES: No  cyanosis, or clubbing. 3+ edema of both lower extremities noted Right leg in immobiliser NEUROLOGIC: Cranial nerves II through XII are intact. Muscle strength 5/5 in all extremities. Sensation intact. Gait not checked.  PSYCHIATRIC: The patient is alert and oriented x 3 .  SKIN: No obvious rash, lesion, or ulcer.    DATA REVIEW:   CBC  Recent Labs Lab 09/20/16 1408  WBC 12.9*  HGB 10.6*  HCT 32.6*  PLT 65*    Chemistries   Recent Labs Lab 09/20/16 0330  NA 133*  K 4.3  CL 95*  CO2 30  GLUCOSE 155*  BUN 22*  CREATININE 3.63*  CALCIUM 8.4*     Microbiology Results  Results for orders placed or performed during the hospital encounter of 09/09/16  MRSA PCR Screening     Status: Abnormal   Collection Time: 09/10/16  1:47 AM  Result Value Ref Range  Status   MRSA by PCR POSITIVE (A) NEGATIVE Final    Comment:        The GeneXpert MRSA Assay (FDA approved for NASAL specimens only), is one component of a comprehensive MRSA colonization surveillance program. It is not intended to diagnose MRSA infection nor to guide or monitor treatment for MRSA infections. RESULT CALLED TO, READ BACK BY AND VERIFIED WITH: MONIQUE ROBERSON ON 09/10/16 AT 0359 BY TLB   CULTURE, BLOOD (ROUTINE X 2) w Reflex to ID Panel     Status: None   Collection Time: 09/10/16  8:19 AM  Result Value Ref Range Status   Specimen Description BLOOD RIGHT HAND  Final  Special Requests BOTTLES DRAWN AEROBIC AND ANAEROBIC .5ML  Final   Culture NO GROWTH 5 DAYS  Final   Report Status 09/15/2016 FINAL  Final  CULTURE, BLOOD (ROUTINE X 2) w Reflex to ID Panel     Status: None   Collection Time: 09/10/16  6:04 PM  Result Value Ref Range Status   Specimen Description BLOOD RIGHT HAND  Final   Special Requests BOTTLES DRAWN AEROBIC AND ANAEROBIC .5ML  Final   Culture NO GROWTH 5 DAYS  Final   Report Status 09/15/2016 FINAL  Final    RADIOLOGY:  No results found.   Management plans discussed with the patient, family and they are in agreement.  CODE STATUS:     Code Status Orders        Start     Ordered   09/11/16 1213  Do not attempt resuscitation (DNR)  Continuous    Question Answer Comment  In the event of cardiac or respiratory ARREST Do not call a "code blue"   In the event of cardiac or respiratory ARREST Do not perform Intubation, CPR, defibrillation or ACLS   In the event of cardiac or respiratory ARREST Use medication by any route, position, wound care, and other measures to relive pain and suffering. May use oxygen, suction and manual treatment of airway obstruction as needed for comfort.      09/11/16 1212    Code Status History    Date Active Date Inactive Code Status Order ID Comments User Context   09/10/2016 12:48 AM 09/10/2016  2:44 PM Full  Code [id number]  Harvie Bridge, DO Inpatient   04/06/2016  4:59 PM 04/10/2016  7:57 PM Full Code [id number]  Epifanio Lesches, MD ED   11/20/2015  3:03 PM 11/20/2015  6:10 PM Full Code [id number]  Algernon Huxley, MD Inpatient   08/14/2015  5:40 AM 08/21/2015 10:45 PM Full Code [id number]  Juluis Mire, MD Inpatient   08/02/2015  1:44 PM 08/11/2015 10:21 PM Full Code [id number]  Gladstone Lighter, MD Inpatient      TOTAL TIME TAKING CARE OF THIS PATIENT: 38 minutes.    Stephane Junkins M.D on 09/21/2016 at 11:45 AM  Between 7am to 6pm - Pager - [phone]  After 6pm go to [email or web address] - Proofreader  [facility]  Office  [phone]  CC: Primary care physician; Rusty Aus, MD   Note: This dictation was prepared with Dragon dictation along with smaller phrase technology. Any transcriptional errors that result from this process are unintentional.

## 2016-09-21 NOTE — Progress Notes (Signed)
[facility]  ROUNDING NOTE   Subjective:   Patient is doing fair today Denying any acute complaints Able to eat some without nausea or vomiting Underwent dialysis and the chair yesterday She was able to tolerate it  Objective:  Vital signs in last 24 hours:  Temp:  [97.6 F (36.4 C)-98.2 F (36.8 C)] 98.2 F (36.8 C) (10/10 0520) Pulse Rate:  [102-114] 104 (10/10 0710) Resp:  [14-18] 18 (10/10 0710) BP: (89-116)/(43-64) 96/51 (10/10 0710) SpO2:  [94 %-99 %] 94 % (10/10 0710) FiO2 (%):  [21 %] 21 % (10/09 1358) Weight:  [104.3 kg (230 lb)] 104.3 kg (230 lb) (10/10 0520)  Weight change: -1.089 kg (-2 lb 6.4 oz) Filed Weights   09/18/16 1850 09/20/16 0423 09/21/16 0520  Weight: 103.8 kg (228 lb 13.4 oz) 105.4 kg (232 lb 6.4 oz) 104.3 kg (230 lb)    Intake/Output: I/O last 3 completed shifts: In: 120 [P.O.:120] Out: 1500 [Other:1500]   Intake/Output this shift:  No intake/output data recorded.  Physical Exam: General: ill appearing.   Head: Normocephalic, atraumatic  Eyes: Anicteric  Neck: Supple, trachea midline  Lungs:  Mild scattered rhonchi   Heart: Tachycardia, irregular  Abdomen:  Soft, nontender, BS present   Extremities: Right leg in an immobilizer, 1+ peripheral edema  Neurologic: Alert and oriented.   Skin: No lesions  Access: LUE AVF    Basic Metabolic Panel:  Recent Labs Lab 09/18/16 0324 09/18/16 1540 09/20/16 0330  NA 136 133* 133*  K 3.9 4.1 4.3  CL 97* 96* 95*  CO2 31 29 30   GLUCOSE 234* 279* 155*  BUN 28* 30* 22*  CREATININE 4.05* 4.47* 3.63*  CALCIUM 8.3* 8.2* 8.4*  PHOS  --  2.2*  --     Liver Function Tests:  Recent Labs Lab 09/18/16 1540  ALBUMIN 2.7*   No results for input(s): LIPASE, AMYLASE in the last 168 hours. No results for input(s): AMMONIA in the last 168 hours.  CBC:  Recent Labs Lab 09/17/16 0323 09/18/16 0324 09/18/16 1540 09/20/16 1408  WBC 12.8* 13.5* 14.2* 12.9*  HGB 10.9* 10.3*  10.5* 10.6*  HCT 32.2* 31.8* 31.6* 32.6*  MCV 102.1* 103.8* 103.0* 104.2*  PLT 82* 74* 76* 65*    Cardiac Enzymes: No results for input(s): CKTOTAL, CKMB, CKMBINDEX, TROPONINI in the last 168 hours.  BNP: Invalid input(s): POCBNP  CBG:  Recent Labs Lab 09/20/16 1139 09/20/16 1641 09/20/16 2104 09/21/16 0713 09/21/16 1139  GLUCAP 247* 215* 125* 148* 40*    Microbiology: Results for orders placed or performed during the hospital encounter of 09/09/16  MRSA PCR Screening     Status: Abnormal   Collection Time: 09/10/16  1:47 AM  Result Value Ref Range Status   MRSA by PCR POSITIVE (A) NEGATIVE Final    Comment:        The GeneXpert MRSA Assay (FDA approved for NASAL specimens only), is one component of a comprehensive MRSA colonization surveillance program. It is not intended to diagnose MRSA infection nor to guide or monitor treatment for MRSA infections. RESULT CALLED TO, READ BACK BY AND VERIFIED WITH: MONIQUE ROBERSON ON 09/10/16 AT 0359 BY TLB   CULTURE, BLOOD (ROUTINE X 2) w Reflex to ID Panel     Status: None   Collection Time: 09/10/16  8:19 AM  Result Value Ref Range Status   Specimen Description BLOOD RIGHT HAND  Final   Special Requests BOTTLES DRAWN AEROBIC AND ANAEROBIC .5ML  Final  Culture NO GROWTH 5 DAYS  Final   Report Status 09/15/2016 FINAL  Final  CULTURE, BLOOD (ROUTINE X 2) w Reflex to ID Panel     Status: None   Collection Time: 09/10/16  6:04 PM  Result Value Ref Range Status   Specimen Description BLOOD RIGHT HAND  Final   Special Requests BOTTLES DRAWN AEROBIC AND ANAEROBIC .5ML  Final   Culture NO GROWTH 5 DAYS  Final   Report Status 09/15/2016 FINAL  Final    Coagulation Studies: No results for input(s): LABPROT, INR in the last 72 hours.  Urinalysis: No results for input(s): COLORURINE, LABSPEC, PHURINE, GLUCOSEU, HGBUR, BILIRUBINUR, KETONESUR, PROTEINUR, UROBILINOGEN, NITRITE, LEUKOCYTESUR in the last 72 hours.  Invalid  input(s): APPERANCEUR    Imaging: No results found.   Medications:     . allopurinol  100 mg Oral Daily  . cholecalciferol  1,000 Units Oral Daily  . fluticasone  2 spray Each Nare QHS  . guaiFENesin  600 mg Oral BID  . insulin aspart  0-20 Units Subcutaneous TID WC  . insulin aspart  0-5 Units Subcutaneous QHS  . metoprolol tartrate  12.5 mg Oral BID  . midodrine  10 mg Oral TID WC  . pantoprazole  40 mg Oral QAC breakfast  . polyethylene glycol  17 g Oral Daily  . QUEtiapine  25 mg Oral QHS  . sevelamer carbonate  0.8 g Oral BID BM  . sevelamer carbonate  1.6 g Oral TID WC  . sodium chloride flush  10-40 mL Intracatheter Q12H  . sodium chloride flush  3 mL Intravenous Q12H  . sodium chloride flush  3 mL Intravenous Q12H   sodium chloride, acetaminophen **OR** acetaminophen, haloperidol, ipratropium-albuterol, nystatin cream, ondansetron **OR** ondansetron (ZOFRAN) IV, senna, sodium chloride flush, sodium chloride flush, traMADol  Assessment/ Plan:  82 y.o. white female with end-stage renal disease on hemodialysis, diabetes mellitus type 2, hypertension, obstructive sleep apnea, atrial fibrillation, arthritis, anemia admitted on 09/09/2016 for Hyperkalemia [E87.5] Hyponatremia [E87.1] Weakness [R53.1] Hypoxia [R09.02] Closed nondisplaced fracture of lateral condyle of right femur, initial encounter ([place]) [S72.424A]   MWF [place]  1.  End-stage renal disease with hyperkalemia:   -  midodrine with treatment.  - Was able to tolerate Dialysis in the chair via [facility] lift to prepare for outpatient dialysis.  - Low potassium diet.   2.  Hypotension:  - midodrine    3.  Anemia of chronic kidney disease - EPO with dialysis  4.  Secondary hyperparathyroidism:  Phosphorus and calcium at goal.  -  sevelamer with meals.   5. Right femur fracture:   impacted right distal femur fx just above her TKR not a surgical candidate. Placed in an immobilizer.  Patient  [facility].  I have discussed with care management to confirm that [facility]  transportation  been arranged for her to go to dialysis.  She will be transported in a wheelchair.  I have communicated to outpatient dialysis nursing staff to be  extra careful with her right leg since it has a recent fracture.    LOS: 12 [name] 10/10/20171:35 PM

## 2016-09-21 NOTE — Progress Notes (Signed)
Patient is medically stable for D/C to [facility] today. Per RN patient sat up in the chair and tolerated the entire dialysis session on yesterday. Per [facility] liaison patient can come to room 707. RN will call report to RN Yaakov Guthrie at [phone] and arrange EMS for transport. Per [facility] will provide transport to dialysis and they do not use [facility]. Per [facility] patient's husband does not have to call [facility] until patient is discharged home from [facility]. Clinical Education officer, museum (CSW) sent D/C orders to [employer] via [employer]. CSW contacted patient's outpatient dialysis center [facility] on [address] aware that patient is discharging today and will come back to the clinic tomorrow. CSW also faxed D/C Summary to [facility]. Patient is aware of above. Patient's husband is at bedside and aware of above. Please reconsult if future social work needs arise. CSW signing off.   [employer], LCSW [phone]

## 2016-09-21 NOTE — Progress Notes (Signed)
Report called to Myriam Jacobson, RN at [facility]. Patient ready for discharge, family at bedside. Ems contacted for transport.

## 2016-09-21 NOTE — Clinical Social Work Placement (Signed)
   CLINICAL SOCIAL WORK PLACEMENT  NOTE  Date:  09/21/2016  Patient Details  Name: [name] MRN: [id number] Date of Birth: 1934/08/17  Clinical Social Work is seeking post-discharge placement for this patient at the [place] level of care (*CSW will initial, date and re-position this form in  chart as items are completed):  Yes   Patient/family provided with [place] Work Department's list of facilities offering this level of care within the geographic area requested by the patient (or if unable, by the patient's family).  Yes   Patient/family informed of their freedom to choose among providers that offer the needed level of care, that participate in Medicare, Medicaid or managed care program needed by the patient, have an available bed and are willing to accept the patient.  Yes   Patient/family informed of 's ownership interest in [facility] and [facility], as well as of the fact that they are under no obligation to receive care at these facilities.  PASRR submitted to EDS on       PASRR number received on       Existing PASRR number confirmed on 09/18/16     FL2 transmitted to all facilities in geographic area requested by pt/family on 09/18/16     FL2 transmitted to all facilities within larger geographic area on       Patient informed that his/her managed care company has contracts with or will negotiate with certain facilities, including the following:        Yes   Patient/family informed of bed offers received.  [facility] chooses bed at  ([facility] )     Physician recommends and patient chooses bed at      Patient to be transferred to  ([facility] ) on 09/21/16.  Patient to be transferred to facility by  [facility] )     Patient family notified on 09/21/16 of transfer.  Name of family member notified:   (Patient's husband is at bedside and aware of D/C today. )     PHYSICIAN       Additional Comment:     _______________________________________________ Diogo Anne, Veronia Beets, LCSW 09/21/2016, 12:02 PM

## 2016-09-21 NOTE — Progress Notes (Signed)
Inpatient Diabetes Program Recommendations  AACE/ADA: New Consensus Statement on Inpatient Glycemic Control (2015)  Target Ranges:  Prepandial:   less than 140 mg/dL      [facility] postprandial:   less than 180 mg/dL (1-2 hours)      Critically ill patients:  140 - 180 mg/dL   Results for [name], [name] (MRN [id number]) as of 09/21/2016 08:44  Ref. Range 09/20/2016 08:35 09/20/2016 11:39 09/20/2016 16:41 09/20/2016 21:04  Glucose-Capillary Latest Ref Range: 65 - 99 mg/dL 157 (H) 247 (H) 215 (H) 125 (H)    Home DM Meds: Novolog 70/30 Mix Insulin- 20 units bidwc  Current Insulin Orders: Novolog Resistant Correction Scale/ SSI (0-20 units) TID AC + HS    -Per Care Everywhere: Pt saw her PCP Dr. Sabra Heck with the [facility] on 07/08/16.  At that visit, patient was instructed to stop the Humalog 75/25 insulin 50 units bid and she was to continue the Novolog 70/30 Mix Insulin at 20 units bidwc.  -Last A1c on file in EPIC was 6.0% on 04/07/16.  -Patient having postprandial glucose levels in the 200 mg/dl range.     MD- Please consider the following in-hospital insulin adjustments:  1. Start 25% of patient's home dose of 70/30 Insulin- 5 units bid with meals (breakfast and supper)  2. Reduce Novolog Correction Scale/ SSI to Moderate scale (0-15 units) TID AC + HS (currently ordered as Resistant scale 0-20 units)      --Will follow patient during hospitalization--  Wyn Quaker RN, MSN, CDE Diabetes Coordinator Inpatient Glycemic Control Team Team Pager: [phone] (8a-5p)

## 2016-09-21 NOTE — Progress Notes (Signed)
Shift assessment completed. Pt is asleep initially, woke easily to voice, cpap removed, pt denied pain. Lungs are clear bilat, hr is irregular, abdomen is large and soft, bs heard. Pt is wearing incontinence brief,pp faint, cap refill to toes is wnl and feet are warm. Rle has immobilizer in place, LLE has discoloration to shin. General edema noted. Pink sleeve in place to l arm, dialysis fistula is de accessed, bruit/thrill present. Skin tear to r fs has dressing intact. Husband at bedside, stated pt slept well last night. Pt cooperative with meds, is alert and oriented, slef, place, time.

## 2016-09-21 NOTE — Care Management Note (Addendum)
Case Management Note  Patient Details  Name: Puanani Scharrer MRN: [id number] Date of Birth: 18-Oct-1934  Subjective/Objective:    Patient to discharge to [facility] resources today. Patient CPAP broken per spouse. TC to [place]. The machine will have to be brought into the [place] office for repair. CPACP settings 14 set pressure. CSW updated.             Action/Plan:   Expected Discharge Date:                  Expected Discharge Plan:  Skilled Nursing Facility  In-House Referral:  Clinical Social Work  Discharge planning Services     Post Acute Care Choice:    Choice offered to:     DME Arranged:  [facility] D[facility] Agency:     HH Arranged:    [place] Agency:     Status of Service:  Completed, signed off  If discussed at [employer], dates discussed:    Additional Comments:  Jolly Mango, RN 09/21/2016, 8:54 AM

## 2016-09-21 NOTE — Progress Notes (Signed)
Physical Therapy Treatment Patient Details Name: [name] MRN: [id number] DOB: 03-Jun-1934 Today's Date: 09/21/2016    History of Present Illness 82 y/o female who fell after knee buckling while doing exercises at home. She is primarily WC bound at baseline. Upon admission pt found to be hypotensive, hypoxic and hyponatremic. Imaging reveals old L ankle fractures (wears boot), new perihardware distal femur fracture. Rapid response was called early on the 29th, pt had been transfered into the CCU, palliative care was on board and no longer involved.    PT Comments    Pt is able to tolerate palpation of LEs much better today and despite being quite limited was able to participate with exercises with b/l LEs.  She has very little AROM but does show effort and with AAROM is able to minimally assist with most activities.  Pt still having pain with most activities with the cuing, breathing and encouragement showed good effort. Reiterated to pt/family that her path will be long and difficult to regain basic levels of function.   Follow Up Recommendations  SNF     Equipment Recommendations       Recommendations for Other Services       Precautions / Restrictions Precautions Precautions: Fall Restrictions RLE Weight Bearing: Non weight bearing    Mobility  Bed Mobility               General bed mobility comments: deferred mobility as she is not safe   Transfers                    Ambulation/Gait                 Stairs            Wheelchair Mobility    Modified Rankin (Stroke Patients Only)       Balance                                    Cognition Arousal/Alertness: Awake/alert Behavior During Therapy: WFL for tasks assessed/performed Overall Cognitive Status: Within Functional Limits for tasks assessed                      Exercises General Exercises - Lower Extremity Ankle Circles/Pumps:  (lightly resisted  ankle DF X 10 b/l) Quad Sets: Strengthening;10 reps;Both Gluteal Sets: 10 reps;Strengthening;Both Short Arc Quad: Left;AAROM;10 reps Hip ABduction/ADduction: AAROM;10 reps;Both Straight Leg Raises: AAROM;10 reps;Both    General Comments        Pertinent Vitals/Pain Pain Assessment:  (no longer severe w/ light touch cont to have pain w/ any mvt) Pain Score:  (not rated)    Home Living                      Prior Function            PT Goals (current goals can now be found in the care plan section) Progress towards PT goals: Progressing toward goals    Frequency    Min 2X/week      PT Plan Current plan remains appropriate    Co-evaluation             End of Session   Activity Tolerance: Patient limited by pain;Patient limited by fatigue Patient left: with bed alarm set;with call bell/phone within reach;with family/visitor present     Time: [id number] PT Time Calculation (min) (  ACUTE ONLY): 29 min  Charges:  $Therapeutic Exercise: 23-37 mins                    G Codes:      Kreg Shropshire, DPT 09/21/2016, 12:11 PM

## 2016-09-26 ENCOUNTER — Emergency Department: Payer: Medicare Other

## 2016-09-26 ENCOUNTER — Inpatient Hospital Stay
Admission: EM | Admit: 2016-09-26 | Discharge: 2016-09-28 | DRG: 252 | Disposition: A | Discharge status: Skilled Nursing Facility | Payer: Medicare Other | Attending: Internal Medicine | Admitting: Internal Medicine

## 2016-09-26 ENCOUNTER — Encounter: Payer: Self-pay | Admitting: Emergency Medicine

## 2016-09-26 DIAGNOSIS — M7120 Synovial cyst of popliteal space [Baker], unspecified knee: Secondary | ICD-10-CM | POA: Diagnosis present

## 2016-09-26 DIAGNOSIS — D631 Anemia in chronic kidney disease: Secondary | ICD-10-CM | POA: Diagnosis present

## 2016-09-26 DIAGNOSIS — R600 Localized edema: Secondary | ICD-10-CM | POA: Diagnosis present

## 2016-09-26 DIAGNOSIS — E1122 Type 2 diabetes mellitus with diabetic chronic kidney disease: Secondary | ICD-10-CM | POA: Diagnosis present

## 2016-09-26 DIAGNOSIS — Z79891 Long term (current) use of opiate analgesic: Secondary | ICD-10-CM | POA: Diagnosis not present

## 2016-09-26 DIAGNOSIS — Z794 Long term (current) use of insulin: Secondary | ICD-10-CM

## 2016-09-26 DIAGNOSIS — Z96659 Presence of unspecified artificial knee joint: Secondary | ICD-10-CM

## 2016-09-26 DIAGNOSIS — I82401 Acute embolism and thrombosis of unspecified deep veins of right lower extremity: Principal | ICD-10-CM | POA: Diagnosis present

## 2016-09-26 DIAGNOSIS — I509 Heart failure, unspecified: Secondary | ICD-10-CM | POA: Diagnosis present

## 2016-09-26 DIAGNOSIS — I82409 Acute embolism and thrombosis of unspecified deep veins of unspecified lower extremity: Secondary | ICD-10-CM | POA: Diagnosis not present

## 2016-09-26 DIAGNOSIS — I4891 Unspecified atrial fibrillation: Secondary | ICD-10-CM | POA: Diagnosis present

## 2016-09-26 DIAGNOSIS — Z79899 Other long term (current) drug therapy: Secondary | ICD-10-CM | POA: Diagnosis not present

## 2016-09-26 DIAGNOSIS — Z66 Do not resuscitate: Secondary | ICD-10-CM | POA: Diagnosis present

## 2016-09-26 DIAGNOSIS — G4733 Obstructive sleep apnea (adult) (pediatric): Secondary | ICD-10-CM | POA: Diagnosis present

## 2016-09-26 DIAGNOSIS — Z9049 Acquired absence of other specified parts of digestive tract: Secondary | ICD-10-CM | POA: Diagnosis not present

## 2016-09-26 DIAGNOSIS — M1990 Unspecified osteoarthritis, unspecified site: Secondary | ICD-10-CM | POA: Diagnosis present

## 2016-09-26 DIAGNOSIS — Z992 Dependence on renal dialysis: Secondary | ICD-10-CM

## 2016-09-26 DIAGNOSIS — I959 Hypotension, unspecified: Secondary | ICD-10-CM | POA: Diagnosis present

## 2016-09-26 DIAGNOSIS — Z85828 Personal history of other malignant neoplasm of skin: Secondary | ICD-10-CM

## 2016-09-26 DIAGNOSIS — I482 Chronic atrial fibrillation: Secondary | ICD-10-CM | POA: Diagnosis present

## 2016-09-26 DIAGNOSIS — I132 Hypertensive heart and chronic kidney disease with heart failure and with stage 5 chronic kidney disease, or end stage renal disease: Secondary | ICD-10-CM | POA: Diagnosis present

## 2016-09-26 DIAGNOSIS — N2581 Secondary hyperparathyroidism of renal origin: Secondary | ICD-10-CM | POA: Diagnosis present

## 2016-09-26 DIAGNOSIS — L8990 Pressure ulcer of unspecified site, unspecified stage: Secondary | ICD-10-CM | POA: Insufficient documentation

## 2016-09-26 DIAGNOSIS — N186 End stage renal disease: Secondary | ICD-10-CM | POA: Diagnosis present

## 2016-09-26 DIAGNOSIS — I8290 Acute embolism and thrombosis of unspecified vein: Secondary | ICD-10-CM

## 2016-09-26 DIAGNOSIS — Z7901 Long term (current) use of anticoagulants: Secondary | ICD-10-CM | POA: Diagnosis not present

## 2016-09-26 DIAGNOSIS — Z880 Allergy status to penicillin: Secondary | ICD-10-CM

## 2016-09-26 DIAGNOSIS — S7291XA Unspecified fracture of right femur, initial encounter for closed fracture: Secondary | ICD-10-CM

## 2016-09-26 DIAGNOSIS — D696 Thrombocytopenia, unspecified: Secondary | ICD-10-CM | POA: Diagnosis present

## 2016-09-26 LAB — COMPREHENSIVE METABOLIC PANEL
ALBUMIN: 2.8 g/dL — AB (ref 3.5–5.0)
ALK PHOS: 162 U/L — AB (ref 38–126)
ALT: 12 U/L — ABNORMAL LOW (ref 14–54)
ANION GAP: 6 (ref 5–15)
AST: 24 U/L (ref 15–41)
BILIRUBIN TOTAL: 1.5 mg/dL — AB (ref 0.3–1.2)
BUN: 24 mg/dL — ABNORMAL HIGH (ref 6–20)
CALCIUM: 8.8 mg/dL — AB (ref 8.9–10.3)
CO2: 33 mmol/L — ABNORMAL HIGH (ref 22–32)
Chloride: 91 mmol/L — ABNORMAL LOW (ref 101–111)
Creatinine, Ser: 3.39 mg/dL — ABNORMAL HIGH (ref 0.44–1.00)
GFR calc Af Amer: 14 mL/min — ABNORMAL LOW (ref >60)
GFR, EST NON AFRICAN AMERICAN: 12 mL/min — AB (ref >60)
GLUCOSE: 209 mg/dL — AB (ref 65–99)
POTASSIUM: 3.6 mmol/L (ref 3.5–5.1)
Sodium: 130 mmol/L — ABNORMAL LOW (ref 135–145)
TOTAL PROTEIN: 5.8 g/dL — AB (ref 6.5–8.1)

## 2016-09-26 LAB — CBC WITH DIFFERENTIAL/PLATELET
BASOS ABS: 0.1 10*3/uL (ref 0–0.1)
Basophils Relative: 1 %
EOS ABS: 0.1 10*3/uL (ref 0–0.7)
EOS PCT: 2 %
HCT: 34.3 % — ABNORMAL LOW (ref 35.0–47.0)
Hemoglobin: 11.3 g/dL — ABNORMAL LOW (ref 12.0–16.0)
LYMPHS ABS: 1.5 10*3/uL (ref 1.0–3.6)
LYMPHS PCT: 17 %
MCH: 34 pg (ref 26.0–34.0)
MCHC: 32.8 g/dL (ref 32.0–36.0)
MCV: 103.5 fL — AB (ref 80.0–100.0)
MONO ABS: 1.4 10*3/uL — AB (ref 0.2–0.9)
Monocytes Relative: 17 %
Neutro Abs: 5.3 10*3/uL (ref 1.4–6.5)
Neutrophils Relative %: 63 %
PLATELETS: 117 10*3/uL — AB (ref 150–440)
RBC: 3.32 MIL/uL — ABNORMAL LOW (ref 3.80–5.20)
RDW: 18.5 % — AB (ref 11.5–14.5)
WBC: 8.5 10*3/uL (ref 3.6–11.0)

## 2016-09-26 LAB — GLUCOSE, CAPILLARY: GLUCOSE-CAPILLARY: 192 mg/dL — AB (ref 65–99)

## 2016-09-26 LAB — LACTIC ACID, PLASMA: LACTIC ACID, VENOUS: 1.6 mmol/L (ref 0.5–1.9)

## 2016-09-26 LAB — PROTIME-INR
INR: 1.11
Prothrombin Time: 14.4 seconds (ref 11.4–15.2)

## 2016-09-26 LAB — APTT: APTT: 37 s — AB (ref 24–36)

## 2016-09-26 LAB — TROPONIN I: TROPONIN I: 0.07 ng/mL — AB (ref <0.03)

## 2016-09-26 LAB — BRAIN NATRIURETIC PEPTIDE: B Natriuretic Peptide: 663 pg/mL — ABNORMAL HIGH (ref 0.0–100.0)

## 2016-09-26 MED ORDER — TRAMADOL HCL 50 MG PO TABS
50.0000 mg | ORAL_TABLET | Freq: Once | ORAL | Status: AC
  Administered 2016-09-26: 50 mg via ORAL
  Filled 2016-09-26: qty 1

## 2016-09-26 MED ORDER — FLUTICASONE PROPIONATE 50 MCG/ACT NA SUSP
2.0000 | Freq: Every day | NASAL | Status: DC
  Administered 2016-09-26 – 2016-09-27 (×2): 2 via NASAL
  Filled 2016-09-26: qty 16

## 2016-09-26 MED ORDER — FAMOTIDINE IN NACL 20-0.9 MG/50ML-% IV SOLN
20.0000 mg | Freq: Two times a day (BID) | INTRAVENOUS | Status: DC
  Administered 2016-09-26 – 2016-09-27 (×2): 20 mg via INTRAVENOUS
  Filled 2016-09-26 (×3): qty 50

## 2016-09-26 MED ORDER — INSULIN GLARGINE 100 UNIT/ML ~~LOC~~ SOLN
8.0000 [IU] | Freq: Every day | SUBCUTANEOUS | Status: DC
  Administered 2016-09-26 – 2016-09-27 (×2): 8 [IU] via SUBCUTANEOUS
  Filled 2016-09-26 (×3): qty 0.08

## 2016-09-26 MED ORDER — SODIUM CHLORIDE 0.9 % IV BOLUS (SEPSIS)
250.0000 mL | Freq: Once | INTRAVENOUS | Status: AC
  Administered 2016-09-26: 250 mL via INTRAVENOUS

## 2016-09-26 MED ORDER — QUETIAPINE FUMARATE 25 MG PO TABS
25.0000 mg | ORAL_TABLET | Freq: Every day | ORAL | Status: DC
  Administered 2016-09-26 – 2016-09-27 (×2): 25 mg via ORAL
  Filled 2016-09-26 (×2): qty 1

## 2016-09-26 MED ORDER — SEVELAMER CARBONATE 800 MG PO TABS
800.0000 mg | ORAL_TABLET | Freq: Two times a day (BID) | ORAL | Status: DC
  Administered 2016-09-26 – 2016-09-28 (×3): 800 mg via ORAL
  Filled 2016-09-26 (×3): qty 1

## 2016-09-26 MED ORDER — PANTOPRAZOLE SODIUM 40 MG PO TBEC
40.0000 mg | DELAYED_RELEASE_TABLET | Freq: Every day | ORAL | Status: DC
  Administered 2016-09-26 – 2016-09-27 (×2): 40 mg via ORAL
  Filled 2016-09-26 (×2): qty 1

## 2016-09-26 MED ORDER — IPRATROPIUM-ALBUTEROL 0.5-2.5 (3) MG/3ML IN SOLN
3.0000 mL | Freq: Four times a day (QID) | RESPIRATORY_TRACT | Status: DC
  Administered 2016-09-26 – 2016-09-27 (×3): 3 mL via RESPIRATORY_TRACT
  Filled 2016-09-26 (×3): qty 3

## 2016-09-26 MED ORDER — SEVELAMER CARBONATE 800 MG PO TABS: 800.0000 mg | ORAL_TABLET | ORAL | Status: DC

## 2016-09-26 MED ORDER — HEPARIN BOLUS VIA INFUSION
3600.0000 [IU] | Freq: Once | INTRAVENOUS | Status: AC
  Administered 2016-09-26: 3600 [IU] via INTRAVENOUS
  Filled 2016-09-26: qty 3600

## 2016-09-26 MED ORDER — BISACODYL 10 MG RE SUPP: 10.0000 mg | Freq: Every day | RECTAL | Status: DC | PRN

## 2016-09-26 MED ORDER — ALLOPURINOL 100 MG PO TABS
100.0000 mg | ORAL_TABLET | Freq: Every day | ORAL | Status: DC
  Administered 2016-09-26 – 2016-09-27 (×2): 100 mg via ORAL
  Filled 2016-09-26 (×2): qty 1

## 2016-09-26 MED ORDER — POLYETHYLENE GLYCOL 3350 17 G PO PACK
17.0000 g | PACK | Freq: Every day | ORAL | Status: DC
  Administered 2016-09-26: 17 g via ORAL
  Filled 2016-09-26: qty 1

## 2016-09-26 MED ORDER — MORPHINE SULFATE (PF) 2 MG/ML IV SOLN: 2.0000 mg | INTRAVENOUS | Status: DC | PRN

## 2016-09-26 MED ORDER — CALCIUM ACETATE (PHOS BINDER) 667 MG PO CAPS
667.0000 mg | ORAL_CAPSULE | Freq: Two times a day (BID) | ORAL | Status: DC
  Administered 2016-09-27 – 2016-09-28 (×2): 667 mg via ORAL
  Filled 2016-09-26 (×2): qty 1

## 2016-09-26 MED ORDER — ACETAMINOPHEN 325 MG PO TABS: 650.0000 mg | ORAL_TABLET | Freq: Four times a day (QID) | ORAL | Status: DC | PRN

## 2016-09-26 MED ORDER — SENNA 8.6 MG PO TABS: 2.0000 | ORAL_TABLET | Freq: Every day | ORAL | Status: DC | PRN

## 2016-09-26 MED ORDER — HEPARIN (PORCINE) IN NACL 100-0.45 UNIT/ML-% IJ SOLN
1200.0000 [IU]/h | INTRAMUSCULAR | Status: DC
Start: 2016-09-26 — End: 2016-09-28
  Administered 2016-09-26: 1050 [IU]/h via INTRAVENOUS
  Filled 2016-09-26 (×4): qty 250

## 2016-09-26 MED ORDER — TRAZODONE HCL 50 MG PO TABS
50.0000 mg | ORAL_TABLET | Freq: Every day | ORAL | Status: DC
  Administered 2016-09-26 – 2016-09-27 (×2): 50 mg via ORAL
  Filled 2016-09-26 (×2): qty 1

## 2016-09-26 MED ORDER — POTASSIUM CHLORIDE IN NACL 40-0.9 MEQ/L-% IV SOLN
INTRAVENOUS | Status: DC
  Administered 2016-09-26: 50 mL/h via INTRAVENOUS
  Filled 2016-09-26 (×2): qty 1000

## 2016-09-26 MED ORDER — ADULT MULTIVITAMIN W/MINERALS CH
1.0000 | ORAL_TABLET | Freq: Every day | ORAL | Status: DC
  Administered 2016-09-26 – 2016-09-27 (×2): 1 via ORAL
  Filled 2016-09-26 (×2): qty 1

## 2016-09-26 MED ORDER — ONDANSETRON HCL 4 MG PO TABS
4.0000 mg | ORAL_TABLET | Freq: Four times a day (QID) | ORAL | Status: DC | PRN
  Administered 2016-09-28: 4 mg via ORAL
  Filled 2016-09-26: qty 1

## 2016-09-26 MED ORDER — SODIUM CHLORIDE 0.9% FLUSH
3.0000 mL | Freq: Two times a day (BID) | INTRAVENOUS | Status: DC
  Administered 2016-09-26 – 2016-09-27 (×3): 3 mL via INTRAVENOUS

## 2016-09-26 MED ORDER — VITAMIN D 1000 UNITS PO TABS
1000.0000 [IU] | ORAL_TABLET | Freq: Every day | ORAL | Status: DC
  Administered 2016-09-26 – 2016-09-27 (×2): 1000 [IU] via ORAL
  Filled 2016-09-26 (×2): qty 1

## 2016-09-26 MED ORDER — OXYCODONE-ACETAMINOPHEN 7.5-325 MG PO TABS
1.0000 | ORAL_TABLET | ORAL | Status: DC | PRN
  Administered 2016-09-26 – 2016-09-27 (×4): 1 via ORAL
  Filled 2016-09-26 (×4): qty 1

## 2016-09-26 MED ORDER — DOCUSATE SODIUM 100 MG PO CAPS
100.0000 mg | ORAL_CAPSULE | Freq: Two times a day (BID) | ORAL | Status: DC
  Administered 2016-09-26 – 2016-09-28 (×4): 100 mg via ORAL
  Filled 2016-09-26 (×4): qty 1

## 2016-09-26 MED ORDER — ACETAMINOPHEN 650 MG RE SUPP: 650.0000 mg | Freq: Four times a day (QID) | RECTAL | Status: DC | PRN

## 2016-09-26 MED ORDER — INSULIN ASPART 100 UNIT/ML ~~LOC~~ SOLN
0.0000 [IU] | Freq: Three times a day (TID) | SUBCUTANEOUS | Status: DC
  Administered 2016-09-27 (×2): 2 [IU] via SUBCUTANEOUS
  Administered 2016-09-27: 1 [IU] via SUBCUTANEOUS
  Administered 2016-09-28 (×2): 2 [IU] via SUBCUTANEOUS
  Filled 2016-09-26 (×4): qty 2
  Filled 2016-09-26: qty 1

## 2016-09-26 MED ORDER — SEVELAMER CARBONATE 800 MG PO TABS
1600.0000 mg | ORAL_TABLET | Freq: Three times a day (TID) | ORAL | Status: DC
  Administered 2016-09-27 – 2016-09-28 (×4): 1600 mg via ORAL
  Filled 2016-09-26 (×4): qty 2

## 2016-09-26 MED ORDER — PROPRANOLOL HCL 20 MG PO TABS
20.0000 mg | ORAL_TABLET | Freq: Two times a day (BID) | ORAL | Status: DC
Start: 2016-09-26 — End: 2016-09-28
  Administered 2016-09-26: 20 mg via ORAL
  Filled 2016-09-26: qty 1

## 2016-09-26 MED ORDER — ONDANSETRON HCL 4 MG/2ML IJ SOLN
4.0000 mg | Freq: Four times a day (QID) | INTRAMUSCULAR | Status: DC | PRN
  Administered 2016-09-27: 4 mg via INTRAVENOUS
  Filled 2016-09-26: qty 2

## 2016-09-26 MED ORDER — NYSTATIN 100000 UNIT/GM EX CREA
1.0000 "application " | TOPICAL_CREAM | Freq: Two times a day (BID) | CUTANEOUS | Status: DC | PRN
  Filled 2016-09-26: qty 15

## 2016-09-26 NOTE — ED Provider Notes (Signed)
[facility] Emergency Department Provider Note   ____________________________________________   First MD Initiated Contact with Patient 09/26/16 1144     (approximate)  I have reviewed the triage vital signs and the nursing notes.   HISTORY  Chief Complaint Leg Swelling    HPI Tiona Clive is a 82 y.o. female transferred from [facility] for evaluation of leg swelling. She had a right knee periprosthetic femoral condyle fracture diagnosed on the 28th of last month. She has been having increasing swelling in both of her legs. [facility] as worried about a DVT. Patient reports she's not really short of breath anymore than usual or having any other problems except for her leg swelling.   Past Medical History:  Diagnosis Date  . A-fib (HCC)    not on anticoagulation  . Anemia   . Bilateral lower extremity edema   . CHF (congestive heart failure) ([place])   . Diabetes mellitus without complication [facility])    Patient takes Insulin  . Dysrhythmia   . ESRD (end stage renal disease) ([place])    Monday, wednesday, Friday DIalysis  . Essential tremor   . HTN (hypertension)   . OSA (obstructive sleep apnea)   . Osteoarthritis   . Renal insufficiency    Patient is on dialysis and normal days are M,W and F.  . Skin cancer    Resected from legs    Patient Active Problem List   Diagnosis Date Noted  . DVT (deep venous thrombosis) ([place]) 09/26/2016  . Edema, lower extremity 09/26/2016  . Palliative care by specialist   . Closed nondisplaced fracture of lateral condyle of right femur ([place])   . Hyponatremia 09/09/2016  . Hypoxia 04/06/2016  . Healthcare-associated pneumonia 08/14/2015  . Hypoglycemia 08/14/2015  . Hypotension 08/14/2015  . ESRD on hemodialysis ([place]) 08/14/2015  . DM (diabetes mellitus) ([place]) 08/14/2015  . Pleural effusion on right   . Protein-calorie malnutrition, severe ([place]) 08/06/2015  . Community acquired pneumonia   . Pleural  effusion 08/03/2015  . Acute respiratory failure ([place]) 08/03/2015  . Cough 08/03/2015  . Atrial fibrillation ([place]) 08/03/2015    Past Surgical History:  Procedure Laterality Date  . ABDOMINAL HYSTERECTOMY    . ANKLE FRACTURE SURGERY     right ankle  . CHOLECYSTECTOMY    . ESOPHAGOGASTRODUODENOSCOPY (EGD) WITH PROPOFOL N/A 10/09/2015   Procedure: ESOPHAGOGASTRODUODENOSCOPY (EGD) WITH PROPOFOL;  Surgeon: Hulen Luster, MD;  Location[facility] ENDOSCOPY;  Service: Gastroenterology;  Laterality: N/A;  . FEMUR FRACTURE SURGERY     left femur  . KNEE ARTHROPLASTY    . NEUROPLASTY / TRANSPOSITION MEDIAN NERVE AT CARPAL TUNNEL BILATERAL    . PERIPHERAL VASCULAR CATHETERIZATION Left 08/06/2015   Procedure: A/V Shuntogram/Fistulagram;  Surgeon: Algernon Huxley, MD;  Location: [place] CV LAB;  Service: Cardiovascular;  Laterality: Left;  . PERIPHERAL VASCULAR CATHETERIZATION Left 11/20/2015   Procedure: A/V Shuntogram/Fistulagram;  Surgeon: Algernon Huxley, MD;  Location: [place] CV LAB;  Service: Cardiovascular;  Laterality: Left;  . PERIPHERAL VASCULAR CATHETERIZATION N/A 11/20/2015   Procedure: A/V Shunt Intervention;  Surgeon: Algernon Huxley, MD;  Location: [place] CV LAB;  Service: Cardiovascular;  Laterality: N/A;  . TUBAL LIGATION      Prior to Admission medications   Medication Sig Start Date End Date Taking? Authorizing Provider  allopurinol (ZYLOPRIM) 100 MG tablet Take 100 mg by mouth daily.    Yes Historical Provider, MD  calcium acetate (PHOSLO) 667 MG capsule Take  1 capsule (667 mg total) by mouth 2 (two) times daily between meals. 04/10/16  Yes Vaughan Basta, MD  cholecalciferol (VITAMIN D) 1000 units tablet Take 1,000 Units by mouth daily.   Yes Historical Provider, MD  docusate sodium (COLACE) 100 MG capsule Take 1 capsule (100 mg total) by mouth 2 (two) times daily. 04/10/16  Yes Vaughan Basta, MD  fluticasone (FLONASE) 50 MCG/ACT nasal spray Place 2 sprays into both  nostrils at bedtime.   Yes Historical Provider, MD  guaiFENesin (MUCINEX) 600 MG 12 hr tablet Take 1 tablet (600 mg total) by mouth 2 (two) times daily. 09/21/16  Yes Gladstone Lighter, MD  insulin glargine (LANTUS) 100 UNIT/ML injection Inject 0.08 mLs (8 Units total) into the skin at bedtime. 09/21/16  Yes Gladstone Lighter, MD  ipratropium-albuterol (DUONEB) 0.5-2.5 (3) MG/3ML SOLN Take 3 mLs by nebulization 4 (four) times daily as needed (for wheezing/shortness of breath).   Yes Historical Provider, MD  lidocaine-prilocaine (EMLA) cream Apply 1 application topically as needed. Apply to access site (left forearm) 1 hour prior to dialysis and cover with clear wrap. Mon, wed, fri 0500   Yes Historical Provider, MD  midodrine (PROAMATINE) 10 MG tablet Take 1 tablet (10 mg total) by mouth 3 (three) times daily with meals. Patient taking differently: Take 10 mg by mouth 3 (three) times daily with meals. [place], thu, sat - 0900,1800,2100 09/21/16  Yes Gladstone Lighter, MD  Multiple Vitamin (MULTIVITAMIN WITH MINERALS) TABS tablet Take 1 tablet by mouth daily.   Yes Historical Provider, MD  nystatin cream (MYCOSTATIN) Apply 1 application topically 2 (two) times daily as needed (for itching).    Yes Historical Provider, MD  pantoprazole (PROTONIX) 40 MG tablet Take 40 mg by mouth daily.   Yes Historical Provider, MD  polyethylene glycol (MIRALAX / GLYCOLAX) packet Take 17 g by mouth daily. In 4-8oz fluid of choice   Yes Historical Provider, MD  promethazine (PHENERGAN) 25 MG tablet Take 25 mg by mouth every 6 (six) hours as needed for nausea or vomiting.    Yes Historical Provider, MD  propranolol (INDERAL) 20 MG tablet Take 1 tablet (20 mg total) by mouth 2 (two) times daily. 09/21/16  Yes Gladstone Lighter, MD  QUEtiapine (SEROQUEL) 25 MG tablet Take 1 tablet (25 mg total) by mouth at bedtime. 09/21/16  Yes Gladstone Lighter, MD  RENVELA 800 MG tablet Take 800-1,600 mg by mouth 5 (five) times daily. Pt  takes two tablets three times daily with meals and one tablet two times daily with snacks. 06/30/15  Yes Historical Provider, MD  senna (SENOKOT) 8.6 MG TABS tablet Take 2 tablets (17.2 mg total) by mouth daily as needed for mild constipation. 04/10/16  Yes Vaughan Basta, MD  traMADol (ULTRAM) 50 MG tablet Take 1 tablet (50 mg total) by mouth every 6 (six) hours as needed for moderate pain or severe pain. 09/21/16  Yes Gladstone Lighter, MD    Allergies Angiotensin receptor blockers and Penicillins  Family History  Problem Relation Age of Onset  . Diabetes type II Father   . Breast cancer Sister 50  . Breast cancer Maternal Grandmother     Social History Social History  Substance Use Topics  . Smoking status: Never Smoker  . Smokeless tobacco: Never Used  . Alcohol use No    Review of Systems Constitutional: No fever/chills Eyes: No visual changes. ENT: No sore throat. Cardiovascular: Denies chest pain. Respiratory: Denies shortness of breath. Gastrointestinal: No abdominal pain.  No nausea,  no vomiting.  No diarrhea.  No constipation. Genitourinary: Negative for dysuria. Musculoskeletal: Negative for back pain. Skin: Negative for rash. Neurological: Negative for headaches, focal weakness or numbness.  10-point ROS otherwise negative.  ____________________________________________   PHYSICAL EXAM:  VITAL SIGNS: ED Triage Vitals  Enc Vitals Group     BP 09/26/16 1137 119/64     Pulse Rate 09/26/16 1137 (!) 109     Resp 09/26/16 1137 18     Temp 09/26/16 1137 97.3 F (36.3 C)     Temp Source 09/26/16 1137 Oral     SpO2 --      Weight 09/26/16 1139 220 lb (99.8 kg)     Height 09/26/16 1139 5\' 1"  (1.549 m)     Head Circumference --      [facility] --      Pain Score 09/26/16 1155 0     Pain Loc --      Pain Edu? --      Excl. in [place]? --     Constitutional: Alert and oriented. Well appearing and in no acute distress. Eyes: Conjunctivae are normal. PERRL.  EOMI. Head: Atraumatic. Nose: No congestion/rhinnorhea. Mouth/Throat: Mucous membranes are moist.  Oropharynx non-erythematous. Neck: No stridor.  Cardiovascular: Normal rate, regular rhythm. Grossly normal heart sounds.  Good peripheral circulation. Respiratory: Normal respiratory effort.  No retractions. Lungs CTAB. Gastrointestinal: Soft and nontender. No distention. No abdominal bruits. No CVA tenderness. Musculoskeletal:Bilateral marked edema. It's weeping on both sides. Neurologic:  Normal speech and language. No gross focal neurologic deficits are appreciated. No gait instability. Skin: Bruising on both legs. There is some redness and beginning of skin breakdown over the sacrum.  ____________________________________________   LABS (all labs ordered are listed, but only abnormal results are displayed)  Labs Reviewed  COMPREHENSIVE METABOLIC PANEL - Abnormal; Notable for the following:       Result Value   Sodium 130 (*)    Chloride 91 (*)    CO2 33 (*)    Glucose, Bld 209 (*)    BUN 24 (*)    Creatinine, Ser 3.39 (*)    Calcium 8.8 (*)    Total Protein 5.8 (*)    Albumin 2.8 (*)    ALT 12 (*)    Alkaline Phosphatase 162 (*)    Total Bilirubin 1.5 (*)    GFR calc non Af Amer 12 (*)    GFR calc Af Amer 14 (*)    All other components within normal limits  TROPONIN I - Abnormal; Notable for the following:    Troponin I 0.07 (*)    All other components within normal limits  BRAIN NATRIURETIC PEPTIDE - Abnormal; Notable for the following:    B Natriuretic Peptide 663.0 (*)    All other components within normal limits  CBC WITH DIFFERENTIAL/PLATELET - Abnormal; Notable for the following:    RBC 3.32 (*)    Hemoglobin 11.3 (*)    HCT 34.3 (*)    MCV 103.5 (*)    RDW 18.5 (*)    Platelets 117 (*)    Monocytes Absolute 1.4 (*)    All other components within normal limits  APTT - Abnormal; Notable for the following:    aPTT 37 (*)    All other components within normal  limits  LACTIC ACID, PLASMA  PROTIME-INR  URINALYSIS COMPLETEWITH MICROSCOPIC ([facility] ONLY)  HEPARIN LEVEL (UNFRACTIONATED)   ____________________________________________  EKG  EKG read and interpreted by me shows atrial fibrillation at a  rate of 87 normal appearing axis low voltage diffusely no obvious acute changes. Looks similar to EKG from 09/09/2016. ____________________________________________  RADIOLOGY  Study Result   CLINICAL DATA:  Left leg pain and swelling today.  EXAM: PORTABLE CHEST 1 VIEW  COMPARISON:  09/19/2016 and prior exam  FINDINGS: Cardiomegaly, right hemidiaphragm elevation and right basilar scarring again noted.  There is no evidence of focal airspace disease, pulmonary edema, suspicious pulmonary nodule/mass, pleural effusion, or pneumothorax.  No acute bony abnormalities are identified.  IMPRESSION: Cardiomegaly without evidence of acute cardiopulmonary disease.   Electronically Signed   By: Margarette Canada M.D.   On: 09/26/2016 12:11    ______Study Result   CLINICAL DATA:  82 year old female with a history of pain and swelling bilateral legs.  EXAM: BILATERAL LOWER EXTREMITY VENOUS DOPPLER ULTRASOUND  TECHNIQUE: Gray-scale sonography with graded compression, as well as color Doppler and duplex ultrasound were performed to evaluate the lower extremity deep venous systems from the level of the common femoral vein and including the common femoral, femoral, profunda femoral, popliteal and calf veins including the posterior tibial, peroneal and gastrocnemius veins when visible. The superficial great saphenous vein was also interrogated. Spectral Doppler was utilized to evaluate flow at rest and with distal augmentation maneuvers in the common femoral, femoral and popliteal veins.  COMPARISON:  09/11/2016  FINDINGS: RIGHT LOWER EXTREMITY  Common Femoral Vein: No evidence of thrombus. Normal compressibility, respiratory  phasicity and response to augmentation.  Saphenofemoral Junction: No evidence of thrombus. Normal compressibility and flow on color Doppler imaging.  Profunda Femoral Vein: No evidence of thrombus. Normal compressibility and flow on color Doppler imaging.  Femoral Vein: Nonocclusive thrombus of the proximal right femoral vein, new from the prior.  Popliteal Vein: No evidence of thrombus. Normal compressibility, respiratory phasicity and response to augmentation.  Calf Veins: No evidence of thrombus. Normal compressibility and flow on color Doppler imaging.  Superficial Great Saphenous Vein: No evidence of thrombus. Normal compressibility and flow on color Doppler imaging.  Other Findings:  Edema of the right lower extremity.  LEFT LOWER EXTREMITY  Common Femoral Vein: No evidence of thrombus. Normal compressibility, respiratory phasicity and response to augmentation.  Saphenofemoral Junction: No evidence of thrombus. Normal compressibility and flow on color Doppler imaging.       Profunda Femoral Vein: No evidence of thrombus. Normal compressibility and flow on color Doppler imaging.  Femoral Vein: No evidence of thrombus. Normal compressibility, respiratory phasicity and response to augmentation.  Popliteal Vein: No evidence of thrombus. Normal compressibility, respiratory phasicity and response to augmentation.  Calf Veins: No evidence of thrombus. Normal compressibility and flow on color Doppler imaging.  Superficial Great Saphenous Vein: No evidence of thrombus. Normal compressibility and flow on color Doppler imaging.  Other Findings: Heterogeneously echoic cystic structure in the left popliteal fossa measuring as large as 6 cm. Lower extremity edema.  IMPRESSION: Right:  Survey is positive for nonocclusive DVT involving proximal right femoral vein, new from the comparison duplex survey.  Edema  Left:  Survey of the left lower  extremity negative for DVT.  Likely Baker cyst in the left popliteal fossa.  Edema  Technologist performing the exam will contact the ordering physician in the emergency department.  Signed,  Dulcy Fanny. Earleen Newport, DO  [facility]  [facility]   Electronically Signed   By: Corrie Mckusick D.O.   On: 09/26/2016 14:03   ______________________________________   PROCEDURES  Procedure(s) performed:   Procedures  Critical  Care performed:   ____________________________________________   INITIAL IMPRESSION / ASSESSMENT AND PLAN / ED COURSE  Pertinent labs & imaging results that were available during my care of the patient were reviewed by me and considered in my medical decision making (see chart for details).   Clinical Course   Upon and has been chronically elevated.  ____________________________________________   FINAL CLINICAL IMPRESSION(S) / ED DIAGNOSES  Final diagnoses:  Leg edema  Venous thrombosis      NEW MEDICATIONS STARTED DURING THIS VISIT:  New Prescriptions   No medications on file     Note:  This document was prepared using Dragon voice recognition software and may include unintentional dictation errors.    Nena Polio, MD 09/26/16 [phone]

## 2016-09-26 NOTE — ED Notes (Signed)
AAOx3.  Skin warm and dry.  NAD 

## 2016-09-26 NOTE — ED Notes (Signed)
MD Malinda informed of elevated troponin result  Pt is currently in [place]  Continue to monitor

## 2016-09-26 NOTE — ED Triage Notes (Signed)
Arrives from [facility] with c/o left leg swelling, which started today.  Patient is currently at [facility] due to right knee fracture.

## 2016-09-26 NOTE — Consult Note (Signed)
ANTICOAGULATION CONSULT NOTE - Initial Consult  Pharmacy Consult for heparin drip Indication: DVT  Allergies  Allergen Reactions  . Angiotensin Receptor Blockers Other (See Comments)    Reaction:  Hyperkalemia  . Penicillins Rash and Other (See Comments)    rash    Patient Measurements: Height: 5\' 1"  (154.9 cm) Weight: 220 lb (99.8 kg) IBW/kg (Calculated) : 47.8 Heparin Dosing Weight: 71.8kg  Vital Signs: Temp: 97.3 F (36.3 C) (10/15 1137) Temp Source: Oral (10/15 1137) BP: 119/64 (10/15 1137) Pulse Rate: 109 (10/15 1137)  Labs:  Recent Labs  09/26/16 1148  HGB 11.3*  HCT 34.3*  PLT 117*  CREATININE 3.39*  TROPONINI 0.07*    Estimated Creatinine Clearance: 14.3 mL/min (by C-G formula based on SCr of 3.39 mg/dL (H)).   Medical History: Past Medical History:  Diagnosis Date  . A-fib (HCC)    not on anticoagulation  . Anemia   . Bilateral lower extremity edema   . CHF (congestive heart failure) ([place])   . Diabetes mellitus without complication [facility])    Patient takes Insulin  . Dysrhythmia   . ESRD (end stage renal disease) ([place])    Monday, wednesday, Friday DIalysis  . Essential tremor   . HTN (hypertension)   . OSA (obstructive sleep apnea)   . Osteoarthritis   . Renal insufficiency    Patient is on dialysis and normal days are M,W and F.  . Skin cancer    Resected from legs    Medications:  Scheduled:  . heparin  3,600 Units Intravenous Once  . insulin aspart  0-9 Units Subcutaneous TID WC  . ipratropium-albuterol  3 mL Nebulization QID    Assessment: Pt is a 82 year old female s/p right knee fracture who presents with a right lower extremity DVT. Med rec shows no home anticoagulation. Baseline labs ordered.  Goal of Therapy:  Heparin level 0.3-0.7 units/ml Monitor platelets by anticoagulation protocol: Yes   Plan:  Give 3600 units bolus x 1 Start heparin infusion at 1050 units/hr Check anti-Xa level in 8 hours and daily while on  heparin Continue to monitor H&H and platelets  Baylen Buckner D Cassie Shedlock, Pharm.D Clinical Pharmacist  09/26/2016,4:29 PM

## 2016-09-26 NOTE — ED Notes (Signed)
Patient incontinent stool.  Linen changed. Skin care given.  Continue to monitor.

## 2016-09-26 NOTE — Progress Notes (Signed)
Pt's BP 85/43, no complains made, NSR to ST on telemetry. On call Dr Estanislado Pandy paged, ordered for 263ml NS bolus. Will administer as ordered and continue to monitor.

## 2016-09-26 NOTE — H&P (Signed)
History and Physical    [name] [id number] DOB: 11/24/1934 DOA: 09/26/2016  Referring physician: Dr. Cinda Quest PCP: Rusty Aus, MD  Specialists: Dr. Nehemiah Massed  Chief Complaint: LE edema and pain  HPI: [name] is a 82 y.o. female has a past medical history significant for ESRD on HD, chronic a-fib, and thrombocytopenia s/p recent right knee surgery who presents from SNF with worsening LE edema and pain. In ER, pt appears to be volume compensated but LE US reveals acute DVT on right and Baker's cyst on left. Denies fever. No Cp. Does have chronic DOE. She is now admitted.  Review of Systems: The patient denies anorexia, fever, weight loss,, vision loss, decreased hearing, hoarseness, chest pain, syncope, peripheral edema, balance deficits, hemoptysis, abdominal pain, melena, hematochezia, severe indigestion/heartburn, hematuria, incontinence, genital sores, muscle weakness, suspicious skin lesions, transient blindness,  depression, unusual weight change, abnormal bleeding, enlarged lymph nodes, angioedema, and breast masses.   Past Medical History:  Diagnosis Date  . A-fib (HCC)    not on anticoagulation  . Anemia   . Bilateral lower extremity edema   . CHF (congestive heart failure) ([place])   . Diabetes mellitus without complication [facility])    Patient takes Insulin  . Dysrhythmia   . ESRD (end stage renal disease) ([place])    Monday, wednesday, Friday DIalysis  . Essential tremor   . HTN (hypertension)   . OSA (obstructive sleep apnea)   . Osteoarthritis   . Renal insufficiency    Patient is on dialysis and normal days are M,W and F.  . Skin cancer    Resected from legs   Past Surgical History:  Procedure Laterality Date  . ABDOMINAL HYSTERECTOMY    . ANKLE FRACTURE SURGERY     right ankle  . CHOLECYSTECTOMY    . ESOPHAGOGASTRODUODENOSCOPY (EGD) WITH PROPOFOL N/A 10/09/2015   Procedure: ESOPHAGOGASTRODUODENOSCOPY (EGD) WITH PROPOFOL;  Surgeon: Hulen Luster, MD;   Location: [facility] ENDOSCOPY;  Service: Gastroenterology;  Laterality: N/A;  . FEMUR FRACTURE SURGERY     left femur  . KNEE ARTHROPLASTY    . NEUROPLASTY / TRANSPOSITION MEDIAN NERVE AT CARPAL TUNNEL BILATERAL    . PERIPHERAL VASCULAR CATHETERIZATION Left 08/06/2015   Procedure: A/V Shuntogram/Fistulagram;  Surgeon: Algernon Huxley, MD;  Location: [place] CV LAB;  Service: Cardiovascular;  Laterality: Left;  . PERIPHERAL VASCULAR CATHETERIZATION Left 11/20/2015   Procedure: A/V Shuntogram/Fistulagram;  Surgeon: Algernon Huxley, MD;  Location: [place] CV LAB;  Service: Cardiovascular;  Laterality: Left;  . PERIPHERAL VASCULAR CATHETERIZATION N/A 11/20/2015   Procedure: A/V Shunt Intervention;  Surgeon: Algernon Huxley, MD;  Location: [facility] CV LAB;  Service: Cardiovascular;  Laterality: N/A;  . TUBAL LIGATION     Social History:  reports that she has never smoked. She has never used smokeless tobacco. She reports that she does not drink alcohol or use drugs.  Allergies  Allergen Reactions  . Angiotensin Receptor Blockers Other (See Comments)    Reaction:  Hyperkalemia  . Penicillins Rash and Other (See Comments)    rash    Family History  Problem Relation Age of Onset  . Diabetes type II Father   . Breast cancer Sister 66  . Breast cancer Maternal Grandmother     Prior to Admission medications   Medication Sig Start Date End Date Taking? Authorizing Provider  allopurinol (ZYLOPRIM) 100 MG tablet Take 100 mg by mouth daily.    Yes Historical Provider, MD  calcium  acetate (PHOSLO) 667 MG capsule Take 1 capsule (667 mg total) by mouth 2 (two) times daily between meals. 04/10/16  Yes Vaughan Basta, MD  cholecalciferol (VITAMIN D) 1000 units tablet Take 1,000 Units by mouth daily.   Yes Historical Provider, MD  docusate sodium (COLACE) 100 MG capsule Take 1 capsule (100 mg total) by mouth 2 (two) times daily. 04/10/16  Yes Vaughan Basta, MD  fluticasone (FLONASE) 50  MCG/ACT nasal spray Place 2 sprays into both nostrils at bedtime.   Yes Historical Provider, MD  guaiFENesin (MUCINEX) 600 MG 12 hr tablet Take 1 tablet (600 mg total) by mouth 2 (two) times daily. 09/21/16  Yes Gladstone Lighter, MD  insulin glargine (LANTUS) 100 UNIT/ML injection Inject 0.08 mLs (8 Units total) into the skin at bedtime. 09/21/16  Yes Gladstone Lighter, MD  ipratropium-albuterol (DUONEB) 0.5-2.5 (3) MG/3ML SOLN Take 3 mLs by nebulization 4 (four) times daily as needed (for wheezing/shortness of breath).   Yes Historical Provider, MD  lidocaine-prilocaine (EMLA) cream Apply 1 application topically as needed. Apply to access site (left forearm) 1 hour prior to dialysis and cover with clear wrap. Mon, wed, fri 0500   Yes Historical Provider, MD  midodrine (PROAMATINE) 10 MG tablet Take 1 tablet (10 mg total) by mouth 3 (three) times daily with meals. Patient taking differently: Take 10 mg by mouth 3 (three) times daily with meals. [place] Reek, thu, sat - 0900,1800,2100 09/21/16  Yes Gladstone Lighter, MD  Multiple Vitamin (MULTIVITAMIN WITH MINERALS) TABS tablet Take 1 tablet by mouth daily.   Yes Historical Provider, MD  nystatin cream (MYCOSTATIN) Apply 1 application topically 2 (two) times daily as needed (for itching).    Yes Historical Provider, MD  pantoprazole (PROTONIX) 40 MG tablet Take 40 mg by mouth daily.   Yes Historical Provider, MD  polyethylene glycol (MIRALAX / GLYCOLAX) packet Take 17 g by mouth daily. In 4-8oz fluid of choice   Yes Historical Provider, MD  promethazine (PHENERGAN) 25 MG tablet Take 25 mg by mouth every 6 (six) hours as needed for nausea or vomiting.    Yes Historical Provider, MD  propranolol (INDERAL) 20 MG tablet Take 1 tablet (20 mg total) by mouth 2 (two) times daily. 09/21/16  Yes Gladstone Lighter, MD  QUEtiapine (SEROQUEL) 25 MG tablet Take 1 tablet (25 mg total) by mouth at bedtime. 09/21/16  Yes Gladstone Lighter, MD  RENVELA 800 MG tablet Take  800-1,600 mg by mouth 5 (five) times daily. Pt takes two tablets three times daily with meals and one tablet two times daily with snacks. 06/30/15  Yes Historical Provider, MD  senna (SENOKOT) 8.6 MG TABS tablet Take 2 tablets (17.2 mg total) by mouth daily as needed for mild constipation. 04/10/16  Yes Vaughan Basta, MD  traMADol (ULTRAM) 50 MG tablet Take 1 tablet (50 mg total) by mouth every 6 (six) hours as needed for moderate pain or severe pain. 09/21/16  Yes Gladstone Lighter, MD   Physical Exam: Vitals:   09/26/16 1137 09/26/16 1139  BP: 119/64   Pulse: (!) 109   Resp: 18   Temp: 97.3 F (36.3 C)   TempSrc: Oral   Weight:  99.8 kg (220 lb)  Height:  5\' 1"  (1.549 m)     General: Chronically ill appearing but in No apparent distress, obese, [place]/AT  Eyes: PERRL, EOMI, no scleral icterus, conjunctiva clear  ENT: moist oropharynx without exudate, TM's benign, dentition fair  Neck: supple, no lymphadenopathy. No bruits or thyromegaly  Cardiovascular:  Irregularly irregular with 2/6 murmur noted. No rubs or gallops; 2+ peripheral pulses, no JVD, 3+ peripheral edema  Respiratory: CTA biL, good air movement without wheezing, rhonchi. Faint basilar rales. Respiratory effort normal  Abdomen: soft, non tender to palpation, positive bowel sounds, no guarding, no rebound  Skin: no rashes. Diffuse bruising noted  Musculoskeletal: normal bulk and tone, no joint swelling  Psychiatric: normal mood and affect, A&OX3  Neurologic: CN 2-12 grossly intact, Motor strength 5/5 in all 4 groups with symmetric DTR's and non-focal sensory exam  Labs on Admission:  Basic Metabolic Panel:  Recent Labs Lab 09/20/16 0330 09/26/16 1148  NA 133* 130*  K 4.3 3.6  CL 95* 91*  CO2 30 33*  GLUCOSE 155* 209*  BUN 22* 24*  CREATININE 3.63* 3.39*  CALCIUM 8.4* 8.8*   Liver Function Tests:  Recent Labs Lab 09/26/16 1148  AST 24  ALT 12*  ALKPHOS 162*  BILITOT 1.5*  PROT 5.8*   ALBUMIN 2.8*   No results for input(s): LIPASE, AMYLASE in the last 168 hours. No results for input(s): AMMONIA in the last 168 hours. CBC:  Recent Labs Lab 09/20/16 1408 09/26/16 1148  WBC 12.9* 8.5  NEUTROABS  --  5.3  HGB 10.6* 11.3*  HCT 32.6* 34.3*  MCV 104.2* 103.5*  PLT 65* 117*   Cardiac Enzymes:  Recent Labs Lab 09/26/16 1148  TROPONINI 0.07*    BNP (last 3 results)  Recent Labs  09/26/16 1148  BNP 663.0*    ProBNP (last 3 results) No results for input(s): PROBNP in the last 8760 hours.  CBG:  Recent Labs Lab 09/20/16 1139 09/20/16 1641 09/20/16 2104 09/21/16 0713 09/21/16 1139  GLUCAP 247* 215* 125* 148* 236*    Radiological Exams on Admission: US Venous Img Lower Bilateral  Result Date: 09/26/2016 CLINICAL DATA:  82 year old female with a history of pain and swelling bilateral legs. EXAM: BILATERAL LOWER EXTREMITY VENOUS DOPPLER ULTRASOUND TECHNIQUE: Gray-scale sonography with graded compression, as well as color Doppler and duplex ultrasound were performed to evaluate the lower extremity deep venous systems from the level of the common femoral vein and including the common femoral, femoral, profunda femoral, popliteal and calf veins including the posterior tibial, peroneal and gastrocnemius veins when visible. The superficial great saphenous vein was also interrogated. Spectral Doppler was utilized to evaluate flow at rest and with distal augmentation maneuvers in the common femoral, femoral and popliteal veins. COMPARISON:  09/11/2016 FINDINGS: RIGHT LOWER EXTREMITY Common Femoral Vein: No evidence of thrombus. Normal compressibility, respiratory phasicity and response to augmentation. Saphenofemoral Junction: No evidence of thrombus. Normal compressibility and flow on color Doppler imaging. Profunda Femoral Vein: No evidence of thrombus. Normal compressibility and flow on color Doppler imaging. Femoral Vein: Nonocclusive thrombus of the proximal  right femoral vein, new from the prior. Popliteal Vein: No evidence of thrombus. Normal compressibility, respiratory phasicity and response to augmentation. Calf Veins: No evidence of thrombus. Normal compressibility and flow on color Doppler imaging. Superficial Great Saphenous Vein: No evidence of thrombus. Normal compressibility and flow on color Doppler imaging. Other Findings:  Edema of the right lower extremity. LEFT LOWER EXTREMITY Common Femoral Vein: No evidence of thrombus. Normal compressibility, respiratory phasicity and response to augmentation. Saphenofemoral Junction: No evidence of thrombus. Normal compressibility and flow on color Doppler imaging. Profunda Femoral Vein: No evidence of thrombus. Normal compressibility and flow on color Doppler imaging. Femoral Vein: No evidence of thrombus. Normal compressibility, respiratory phasicity and response to augmentation. Popliteal  Vein: No evidence of thrombus. Normal compressibility, respiratory phasicity and response to augmentation. Calf Veins: No evidence of thrombus. Normal compressibility and flow on color Doppler imaging. Superficial Great Saphenous Vein: No evidence of thrombus. Normal compressibility and flow on color Doppler imaging. Other Findings: Heterogeneously echoic cystic structure in the left popliteal fossa measuring as large as 6 cm. Lower extremity edema. IMPRESSION: Right: Survey is positive for nonocclusive DVT involving proximal right femoral vein, new from the comparison duplex survey. Edema Left: Survey of the left lower extremity negative for DVT. Likely Baker cyst in the left popliteal fossa. Edema Technologist performing the exam will contact the ordering physician in the emergency department. Signed, Dulcy Fanny. Earleen Newport, DO [facility] Radiology Electronically Signed   By: Corrie Mckusick D.O.   On: 09/26/2016 14:03   Dg Chest Portable 1 View  Result Date: 09/26/2016 CLINICAL  DATA:  Left leg pain and swelling today. EXAM: PORTABLE CHEST 1 VIEW COMPARISON:  09/19/2016 and prior exam FINDINGS: Cardiomegaly, right hemidiaphragm elevation and right basilar scarring again noted. There is no evidence of focal airspace disease, pulmonary edema, suspicious pulmonary nodule/mass, pleural effusion, or pneumothorax. No acute bony abnormalities are identified. IMPRESSION: Cardiomegaly without evidence of acute cardiopulmonary disease. Electronically Signed   By: Margarette Canada M.D.   On: 09/26/2016 12:11    EKG: Independently reviewed.  Assessment/Plan Principal Problem:   DVT (deep venous thrombosis) (HCC) Active Problems:   Atrial fibrillation (HCC)   ESRD on hemodialysis (HCC)   Edema, lower extremity   Will begin IV Heparin and admit to floor with telemetry. Follow hgb and platelets closely. Follow sugars. Consult Nephrology for ongoing HD. Consult Vascular in regards to LE DVT and need for coumadin vs. Filter. Consult Cardiology for a-fib and CHF. Consult Ortho for knee pain and Baker's cyst. Repeat labs in AM. Monitor oxygen closely.  Diet: low salt, carb modified Fluids: NS with K+@50  DVT Prophylaxis: IV Heparin  Code Status: DNR  Family Communication: yes  Disposition Plan: SNF  Time spent: 55 min

## 2016-09-27 ENCOUNTER — Inpatient Hospital Stay: Payer: Medicare Other

## 2016-09-27 ENCOUNTER — Encounter
Admission: EM | Disposition: A | Discharge status: Skilled Nursing Facility | Payer: Self-pay | Source: Home / Self Care | Attending: Internal Medicine

## 2016-09-27 DIAGNOSIS — L8990 Pressure ulcer of unspecified site, unspecified stage: Secondary | ICD-10-CM | POA: Insufficient documentation

## 2016-09-27 DIAGNOSIS — R600 Localized edema: Secondary | ICD-10-CM

## 2016-09-27 DIAGNOSIS — N186 End stage renal disease: Secondary | ICD-10-CM

## 2016-09-27 DIAGNOSIS — I82409 Acute embolism and thrombosis of unspecified deep veins of unspecified lower extremity: Secondary | ICD-10-CM

## 2016-09-27 DIAGNOSIS — I4891 Unspecified atrial fibrillation: Secondary | ICD-10-CM

## 2016-09-27 HISTORY — PX: PERIPHERAL VASCULAR CATHETERIZATION: SHX172C

## 2016-09-27 LAB — COMPREHENSIVE METABOLIC PANEL
ALBUMIN: 2.6 g/dL — AB (ref 3.5–5.0)
ALT: 11 U/L — AB (ref 14–54)
ANION GAP: 6 (ref 5–15)
AST: 22 U/L (ref 15–41)
Alkaline Phosphatase: 148 U/L — ABNORMAL HIGH (ref 38–126)
BUN: 28 mg/dL — ABNORMAL HIGH (ref 6–20)
CHLORIDE: 93 mmol/L — AB (ref 101–111)
CO2: 29 mmol/L (ref 22–32)
Calcium: 8.7 mg/dL — ABNORMAL LOW (ref 8.9–10.3)
Creatinine, Ser: 3.77 mg/dL — ABNORMAL HIGH (ref 0.44–1.00)
GFR calc non Af Amer: 10 mL/min — ABNORMAL LOW (ref >60)
GFR, EST AFRICAN AMERICAN: 12 mL/min — AB (ref >60)
GLUCOSE: 191 mg/dL — AB (ref 65–99)
Potassium: 3.9 mmol/L (ref 3.5–5.1)
SODIUM: 128 mmol/L — AB (ref 135–145)
Total Bilirubin: 1.5 mg/dL — ABNORMAL HIGH (ref 0.3–1.2)
Total Protein: 5.4 g/dL — ABNORMAL LOW (ref 6.5–8.1)

## 2016-09-27 LAB — CBC
HCT: 31.4 % — ABNORMAL LOW (ref 35.0–47.0)
HEMOGLOBIN: 10.6 g/dL — AB (ref 12.0–16.0)
MCH: 34.3 pg — AB (ref 26.0–34.0)
MCHC: 33.6 g/dL (ref 32.0–36.0)
MCV: 102 fL — AB (ref 80.0–100.0)
Platelets: 106 10*3/uL — ABNORMAL LOW (ref 150–440)
RBC: 3.08 MIL/uL — AB (ref 3.80–5.20)
RDW: 18.1 % — ABNORMAL HIGH (ref 11.5–14.5)
WBC: 8.2 10*3/uL (ref 3.6–11.0)

## 2016-09-27 LAB — GLUCOSE, CAPILLARY
GLUCOSE-CAPILLARY: 126 mg/dL — AB (ref 65–99)
GLUCOSE-CAPILLARY: 160 mg/dL — AB (ref 65–99)
GLUCOSE-CAPILLARY: 148 mg/dL — AB (ref 65–99)
Glucose-Capillary: 158 mg/dL — ABNORMAL HIGH (ref 65–99)

## 2016-09-27 LAB — PHOSPHORUS: PHOSPHORUS: 2.5 mg/dL (ref 2.5–4.6)

## 2016-09-27 LAB — HEPARIN LEVEL (UNFRACTIONATED)
Heparin Unfractionated: 0.33 IU/mL (ref 0.30–0.70)
Heparin Unfractionated: 0.36 IU/mL (ref 0.30–0.70)

## 2016-09-27 SURGERY — IVC FILTER INSERTION
Anesthesia: Moderate Sedation

## 2016-09-27 MED ORDER — MIDAZOLAM HCL 2 MG/2ML IJ SOLN
INTRAMUSCULAR | Status: DC | PRN
  Administered 2016-09-27: 1 mg via INTRAVENOUS

## 2016-09-27 MED ORDER — HEPARIN (PORCINE) IN NACL 2-0.9 UNIT/ML-% IJ SOLN
INTRAMUSCULAR | Status: AC
  Filled 2016-09-27: qty 500

## 2016-09-27 MED ORDER — SODIUM CHLORIDE 0.9 % IV SOLN: 100.0000 mL | INTRAVENOUS | Status: DC | PRN

## 2016-09-27 MED ORDER — WARFARIN SODIUM 2 MG PO TABS: 4.0000 mg | ORAL_TABLET | Freq: Every day | ORAL | Status: DC

## 2016-09-27 MED ORDER — MIDAZOLAM HCL 2 MG/2ML IJ SOLN
INTRAMUSCULAR | Status: AC
  Filled 2016-09-27: qty 2

## 2016-09-27 MED ORDER — LIDOCAINE HCL (PF) 1 % IJ SOLN
INTRAMUSCULAR | Status: AC
  Filled 2016-09-27: qty 30

## 2016-09-27 MED ORDER — IPRATROPIUM-ALBUTEROL 0.5-2.5 (3) MG/3ML IN SOLN: 3.0000 mL | RESPIRATORY_TRACT | Status: DC | PRN

## 2016-09-27 MED ORDER — HEPARIN SODIUM (PORCINE) 1000 UNIT/ML DIALYSIS
1000.0000 [IU] | INTRAMUSCULAR | Status: DC | PRN
  Filled 2016-09-27: qty 1

## 2016-09-27 MED ORDER — LIDOCAINE-PRILOCAINE 2.5-2.5 % EX CREA
1.0000 "application " | TOPICAL_CREAM | CUTANEOUS | Status: DC | PRN
  Filled 2016-09-27: qty 5

## 2016-09-27 MED ORDER — ALTEPLASE 2 MG IJ SOLR
2.0000 mg | Freq: Once | INTRAMUSCULAR | Status: DC | PRN
  Filled 2016-09-27: qty 2

## 2016-09-27 MED ORDER — LIDOCAINE HCL (PF) 1 % IJ SOLN
5.0000 mL | INTRAMUSCULAR | Status: DC | PRN
  Filled 2016-09-27: qty 5

## 2016-09-27 MED ORDER — PENTAFLUOROPROP-TETRAFLUOROETH EX AERO
1.0000 "application " | INHALATION_SPRAY | CUTANEOUS | Status: DC | PRN
  Filled 2016-09-27: qty 30

## 2016-09-27 MED ORDER — WARFARIN - PHARMACIST DOSING INPATIENT: Freq: Every day | Status: DC

## 2016-09-27 MED ORDER — EPOETIN ALFA 4000 UNIT/ML IJ SOLN
4000.0000 [IU] | INTRAMUSCULAR | Status: DC
  Administered 2016-09-27: 4000 [IU] via INTRAVENOUS
  Filled 2016-09-27: qty 1

## 2016-09-27 MED ORDER — MIDODRINE HCL 5 MG PO TABS
10.0000 mg | ORAL_TABLET | Freq: Two times a day (BID) | ORAL | Status: DC
  Administered 2016-09-27 – 2016-09-28 (×2): 10 mg via ORAL
  Filled 2016-09-27 (×2): qty 2

## 2016-09-27 MED ORDER — OXYCODONE-ACETAMINOPHEN 7.5-325 MG PO TABS: 1.0000 | ORAL_TABLET | ORAL | Status: DC | PRN

## 2016-09-27 MED ORDER — TRAMADOL HCL 50 MG PO TABS: 50.0000 mg | ORAL_TABLET | Freq: Three times a day (TID) | ORAL | Status: DC | PRN

## 2016-09-27 SURGICAL SUPPLY — 3 items
CANNULA 5F STIFF (CANNULA) ×3 IMPLANT
FILTER VC CELECT-FEMORAL (Filter) ×3 IMPLANT
PACK ANGIOGRAPHY (CUSTOM PROCEDURE TRAY) ×3 IMPLANT

## 2016-09-27 NOTE — Progress Notes (Signed)
Post hd vitals 

## 2016-09-27 NOTE — Progress Notes (Signed)
Pt's serum Na level from 0103 blood draw=128. On call Dr Almyra Free paged and made aware, no new orders made. Pt comfortably sleeping this time. Will continue to monitor.

## 2016-09-27 NOTE — Progress Notes (Signed)
Pt returned to the unit at [place], site of filter placement to R groin has dressing dry and intact. Pt fs is 160, covered with two units insulin. Pt remains drowsy, c/o pain when her legs are moved. Husband at bedside.

## 2016-09-27 NOTE — Consult Note (Signed)
ANTICOAGULATION CONSULT NOTE - Follow up [place] for heparin drip Indication: DVT  Allergies  Allergen Reactions  . Angiotensin Receptor Blockers Other (See Comments)    Reaction:  Hyperkalemia  . Penicillins Rash and Other (See Comments)    rash    Patient Measurements: Height: 5\' 1"  (154.9 cm) Weight: 226 lb (102.5 kg) IBW/kg (Calculated) : 47.8 Heparin Dosing Weight: 71.8kg  Vital Signs: Temp: 98.6 F (37 C) (10/16 1204) BP: 78/43 (10/16 1204) Pulse Rate: 95 (10/16 1204)  Labs:  Recent Labs  09/26/16 1148 09/26/16 1635 09/27/16 0103 09/27/16 0907  HGB 11.3*  --  10.6*  --   HCT 34.3*  --  31.4*  --   PLT 117*  --  106*  --   APTT  --  37*  --   --   LABPROT  --  14.4  --   --   INR  --  1.11  --   --   HEPARINUNFRC  --   --  0.33 0.36  CREATININE 3.39*  --  3.77*  --   TROPONINI 0.07*  --   --   --     Estimated Creatinine Clearance: 13.1 mL/min (by C-G formula based on SCr of 3.77 mg/dL (H)).   Medical History: Past Medical History:  Diagnosis Date  . A-fib (HCC)    not on anticoagulation  . Anemia   . Bilateral lower extremity edema   . CHF (congestive heart failure) ([place])   . Diabetes mellitus without complication [facility])    Patient takes Insulin  . Dysrhythmia   . ESRD (end stage renal disease) ([place])    Monday, wednesday, Friday DIalysis  . Essential tremor   . HTN (hypertension)   . OSA (obstructive sleep apnea)   . Osteoarthritis   . Renal insufficiency    Patient is on dialysis and normal days are M,W and F.  . Skin cancer    Resected from legs    Assessment: Pt is a 82 year old female s/p right knee fracture who presents with a right lower extremity DVT. Med rec shows no home anticoagulation. Baseline labs ordered.  Goal of Therapy:  Heparin level 0.3-0.7 units/ml Monitor platelets by anticoagulation protocol: Yes   Plan:  Give 3600 units bolus x 1 Start heparin infusion at 1050 units/hr Check anti-Xa level in  8 hours and daily while on heparin Continue to monitor H&H and platelets  10/16 0103 HL therapeutic x 1. Continue current rate. Will recheck HL in 8 hours.  10/16 0907 HL of 0.36, repeat level therapeutic. RN confirms heparin running at 1050 units/hr and no s/sx of bleeding. Delay in lab reporting result this AM due to one of their instruments being down. Will continue heparin drip at current rate. Next heparin level in AM and CBC in AM.   Pharmacy will continue to follow.   Rayna Sexton, PharmD, BCPS Clinical Pharmacist 09/27/2016 12:32 PM

## 2016-09-27 NOTE — NC FL2 (Signed)
[place] LEVEL OF CARE SCREENING TOOL     IDENTIFICATION  Patient Name: [name] Birthdate: June 06, 1934 Sex: female Admission Date (Current Location): 09/26/2016  [place] and [place] Number:  Engineering geologist and Address:  [facility], [address] [place], [place] [address]      Provider Number: [id number]  Attending Physician Name and Address:  Fritzi Mandes, MD  Relative Name and Phone Number:       Current Level of Care: Hospital Recommended Level of Care: [place] Prior Approval Number:    Date Approved/Denied:   PASRR Number: [id number]  Discharge Plan: SNF    Current Diagnoses: Patient Active Problem List   Diagnosis Date Noted  . Pressure injury of skin 09/27/2016  . DVT (deep venous thrombosis) ([place]) 09/26/2016  . Edema, lower extremity 09/26/2016  . Palliative care by specialist   . Closed nondisplaced fracture of lateral condyle of right femur ([place])   . Hyponatremia 09/09/2016  . Hypoxia 04/06/2016  . Healthcare-associated pneumonia 08/14/2015  . Hypoglycemia 08/14/2015  . Hypotension 08/14/2015  . ESRD on hemodialysis ([place]) 08/14/2015  . DM (diabetes mellitus) ([place]) 08/14/2015  . Pleural effusion on right   . Protein-calorie malnutrition, severe ([place]) 08/06/2015  . Community acquired pneumonia   . Pleural effusion 08/03/2015  . Acute respiratory failure ([place]) 08/03/2015  . Cough 08/03/2015  . Atrial fibrillation ([place]) 08/03/2015    Orientation RESPIRATION BLADDER Height & Weight     Self, Time, Place, Situation  Normal Incontinent Weight: 229 lb 0.9 oz (103.9 kg) Height:  5\' 1"  (154.9 cm)  BEHAVIORAL SYMPTOMS/MOOD NEUROLOGICAL BOWEL NUTRITION STATUS   (None)  (None) Incontinent Diet (2 gram sodium )  AMBULATORY STATUS COMMUNICATION OF NEEDS Skin   Extensive Assist Verbally Other (Comment) (Pressure Ulcer Stage II- Sacrum; )                       Personal Care  Assistance Level of Assistance  Feeding, Dressing, Bathing Bathing Assistance: Limited assistance Feeding assistance: Independent Dressing Assistance: Limited assistance     Functional Limitations Info  Sight, Hearing Sight Info: Adequate Hearing Info: Impaired Speech Info: Adequate    SPECIAL CARE FACTORS FREQUENCY  PT (By licensed PT), OT (By licensed OT)     PT Frequency: 5 OT Frequency: 5            Contractures      Additional Factors Info  Code Status, Allergies, Insulin Sliding Scale Code Status Info: DNR Allergies Info: Angiotensin Receptor Blockers, Penicillins   Insulin Sliding Scale Info: insulin glargine (LANTUS) injection 8 Units 8 Units, Subcutaneous, Daily at bedtime; insulin aspart (novoLOG) injection 0-9 Units 0-9 Units, Subcutaneous, 3 times daily with meals         Current Medications (09/27/2016):  This is the current hospital active medication list Current Facility-Administered Medications  Medication Dose Route Frequency Provider Last Rate Last Dose  . 0.9 %  sodium chloride infusion  100 mL Intravenous PRN Munsoor Lateef, MD      . 0.9 %  sodium chloride infusion  100 mL Intravenous PRN Munsoor Lateef, MD      . acetaminophen (TYLENOL) tablet 650 mg  650 mg Oral Q6H PRN Idelle Crouch, MD       Or  . acetaminophen (TYLENOL) suppository 650 mg  650 mg Rectal Q6H PRN Idelle Crouch, MD      . allopurinol (ZYLOPRIM) tablet 100 mg  100  mg Oral QHS Idelle Crouch, MD   100 mg at 09/26/16 2040  . alteplase (CATHFLO ACTIVASE) injection 2 mg  2 mg Intracatheter Once PRN Munsoor Lateef, MD      . bisacodyl (DULCOLAX) suppository 10 mg  10 mg Rectal Daily PRN Idelle Crouch, MD      . calcium acetate (PHOSLO) capsule 667 mg  667 mg Oral BID BM Idelle Crouch, MD   667 mg at 09/27/16 1033  . cholecalciferol (VITAMIN D) tablet 1,000 Units  1,000 Units Oral QHS Idelle Crouch, MD   1,000 Units at 09/26/16 2040  . docusate sodium (COLACE) capsule  100 mg  100 mg Oral BID Idelle Crouch, MD   100 mg at 09/27/16 1032  . epoetin alfa (EPOGEN,PROCRIT) injection 4,000 Units  4,000 Units Intravenous Q M,W,F-HD Munsoor Lateef, MD   4,000 Units at 09/27/16 1505  . fluticasone (FLONASE) 50 MCG/ACT nasal spray 2 spray  2 spray Each Nare QHS Idelle Crouch, MD   2 spray at 09/26/16 2045  . heparin ADULT infusion 100 units/mL (25000 units/239mL sodium chloride 0.45%)  1,050 Units/hr Intravenous Continuous Melissa D Maccia, RPH 10.5 mL/hr at 09/27/16 0800 1,050 Units/hr at 09/27/16 0800  . heparin injection 1,000 Units  1,000 Units Dialysis PRN Munsoor Lateef, MD      . insulin aspart (novoLOG) injection 0-9 Units  0-9 Units Subcutaneous TID WC Idelle Crouch, MD   2 Units at 09/27/16 1216  . insulin glargine (LANTUS) injection 8 Units  8 Units Subcutaneous QHS Idelle Crouch, MD   8 Units at 09/26/16 2120  . ipratropium-albuterol (DUONEB) 0.5-2.5 (3) MG/3ML nebulizer solution 3 mL  3 mL Nebulization QID Idelle Crouch, MD   3 mL at 09/27/16 1141  . lidocaine (PF) (XYLOCAINE) 1 % injection 5 mL  5 mL Intradermal PRN Munsoor Lateef, MD      . lidocaine-prilocaine (EMLA) cream 1 application  1 application Topical PRN Munsoor Lateef, MD      . midodrine (PROAMATINE) tablet 10 mg  10 mg Oral BID WC Munsoor Lateef, MD      . multivitamin with minerals tablet 1 tablet  1 tablet Oral QHS Idelle Crouch, MD   1 tablet at 09/26/16 2039  . nystatin cream (MYCOSTATIN) 1 application  1 application Topical BID PRN Idelle Crouch, MD      . ondansetron [facility]) tablet 4 mg  4 mg Oral Q6H PRN Idelle Crouch, MD       Or  . ondansetron [place]) injection 4 mg  4 mg Intravenous Q6H PRN Idelle Crouch, MD      . pantoprazole (PROTONIX) EC tablet 40 mg  40 mg Oral QHS Idelle Crouch, MD   40 mg at 09/26/16 2040  . pentafluoroprop-tetrafluoroeth (GEBAUERS) aerosol 1 application  1 application Topical PRN Munsoor Lateef, MD      . polyethylene glycol  (MIRALAX / GLYCOLAX) packet 17 g  17 g Oral QHS Idelle Crouch, MD   17 g at 09/26/16 2038  . propranolol (INDERAL) tablet 20 mg  20 mg Oral BID Idelle Crouch, MD   20 mg at 09/26/16 2040  . QUEtiapine (SEROQUEL) tablet 25 mg  25 mg Oral QHS Idelle Crouch, MD   25 mg at 09/26/16 2040  . senna (SENOKOT) tablet 17.2 mg  2 tablet Oral Daily PRN Idelle Crouch, MD      . sevelamer carbonate (RENVELA) tablet  1,600 mg  1,600 mg Oral TID WC Idelle Crouch, MD   1,600 mg at 09/27/16 1216  . sevelamer carbonate (RENVELA) tablet 800 mg  800 mg Oral BID Idelle Crouch, MD   800 mg at 09/27/16 1033  . sodium chloride flush (NS) 0.9 % injection 3 mL  3 mL Intravenous Q12H Idelle Crouch, MD   3 mL at 09/27/16 0900  . traMADol (ULTRAM) tablet 50 mg  50 mg Oral Q8H PRN Fritzi Mandes, MD      . traZODone (DESYREL) tablet 50 mg  50 mg Oral QHS Idelle Crouch, MD   50 mg at 09/26/16 2040     Discharge Medications: Please see discharge summary for a list of discharge medications.  Relevant Imaging Results:  Relevant Lab Results:   Additional Information SSN [id number]  [place], LCSW

## 2016-09-27 NOTE — Progress Notes (Addendum)
[facility] NAME: [name]    MR#:  [id number]  DATE OF BIRTH:  1934/07/13  SUBJECTIVE:  Patient was brought in after having right lower extremity pain and swelling found to have DVT husband in the room  REVIEW OF SYSTEMS:   Review of Systems  Constitutional: Negative for chills, fever and weight loss.  HENT: Negative for ear discharge, ear pain and nosebleeds.   Eyes: Negative for blurred vision, pain and discharge.  Respiratory: Negative for sputum production, shortness of breath, wheezing and stridor.   Cardiovascular: Negative for chest pain, palpitations, orthopnea and PND.  Gastrointestinal: Negative for abdominal pain, diarrhea, nausea and vomiting.  Genitourinary: Negative for frequency and urgency.  Musculoskeletal: Negative for back pain and joint pain.  Neurological: Positive for weakness. Negative for sensory change, speech change and focal weakness.  Psychiatric/Behavioral: Negative for depression and hallucinations. The patient is not nervous/anxious.    Tolerating Diet:yes Tolerating PT: pending  DRUG ALLERGIES:   Allergies  Allergen Reactions  . Angiotensin Receptor Blockers Other (See Comments)    Reaction:  Hyperkalemia  . Penicillins Rash and Other (See Comments)    rash    VITALS:  Blood pressure (!) 84/50, pulse (!) 104, temperature 98.4 F (36.9 C), temperature source Axillary, resp. rate 14, height 5\' 1"  (1.549 m), weight 102.5 kg (226 lb), SpO2 100 %.  PHYSICAL EXAMINATION:   Physical Exam  GENERAL:  82 y.o.-year-old patient lying in the bed with no acute distress. Chronically ill EYES: Pupils equal, round, reactive to light and accommodation. No scleral icterus. Extraocular muscles intact.  HEENT: Head atraumatic, normocephalic. Oropharynx and nasopharynx clear.  NECK:  Supple, no jugular venous distention. No thyroid enlargement, no tenderness.  LUNGS: Normal breath sounds  bilaterally, no wheezing, rales, rhonchi. No use of accessory muscles of respiration.  CARDIOVASCULAR: S1, S2 normal. No murmurs, rubs, or gallops.  ABDOMEN: Soft, nontender, nondistended. Bowel sounds present. No organomegaly or mass.  EXTREMITIES: Bilateral lower extremity 3+ edema with ecchymosis and chronic venous stasis changes. Right lower extremity in immobilizer NEUROLOGIC: Cranial nerves II through XII are intact. No focal Motor or sensory deficits b/l.   PSYCHIATRIC:  patient is alert and oriented x 3.  SKIN: No obvious rash, lesion, or ulcer.   LABORATORY PANEL:  CBC  Recent Labs Lab 09/27/16 0103  WBC 8.2  HGB 10.6*  HCT 31.4*  PLT 106*    Chemistries   Recent Labs Lab 09/27/16 0103  NA 128*  K 3.9  CL 93*  CO2 29  GLUCOSE 191*  BUN 28*  CREATININE 3.77*  CALCIUM 8.7*  AST 22  ALT 11*  ALKPHOS 148*  BILITOT 1.5*   Cardiac Enzymes  Recent Labs Lab 09/26/16 1148  TROPONINI 0.07*   RADIOLOGY:  US Venous Img Lower Bilateral  Result Date: 09/26/2016 CLINICAL DATA:  81 year old female with a history of pain and swelling bilateral legs. EXAM: BILATERAL LOWER EXTREMITY VENOUS DOPPLER ULTRASOUND TECHNIQUE: Gray-scale sonography with graded compression, as well as color Doppler and duplex ultrasound were performed to evaluate the lower extremity deep venous systems from the level of the common femoral vein and including the common femoral, femoral, profunda femoral, popliteal and calf veins including the posterior tibial, peroneal and gastrocnemius veins when visible. The superficial great saphenous vein was also interrogated. Spectral Doppler was utilized to evaluate flow at rest and with distal augmentation maneuvers in the common femoral, femoral and popliteal veins. COMPARISON:  09/11/2016  FINDINGS: RIGHT LOWER EXTREMITY Common Femoral Vein: No evidence of thrombus. Normal compressibility, respiratory phasicity and response to augmentation. Saphenofemoral  Junction: No evidence of thrombus. Normal compressibility and flow on color Doppler imaging. Profunda Femoral Vein: No evidence of thrombus. Normal compressibility and flow on color Doppler imaging. Femoral Vein: Nonocclusive thrombus of the proximal right femoral vein, new from the prior. Popliteal Vein: No evidence of thrombus. Normal compressibility, respiratory phasicity and response to augmentation. Calf Veins: No evidence of thrombus. Normal compressibility and flow on color Doppler imaging. Superficial Great Saphenous Vein: No evidence of thrombus. Normal compressibility and flow on color Doppler imaging. Other Findings:  Edema of the right lower extremity. LEFT LOWER EXTREMITY Common Femoral Vein: No evidence of thrombus. Normal compressibility, respiratory phasicity and response to augmentation. Saphenofemoral Junction: No evidence of thrombus. Normal compressibility and flow on color Doppler imaging. Profunda Femoral Vein: No evidence of thrombus. Normal compressibility and flow on color Doppler imaging. Femoral Vein: No evidence of thrombus. Normal compressibility, respiratory phasicity and response to augmentation. Popliteal Vein: No evidence of thrombus. Normal compressibility, respiratory phasicity and response to augmentation. Calf Veins: No evidence of thrombus. Normal compressibility and flow on color Doppler imaging. Superficial Great Saphenous Vein: No evidence of thrombus. Normal compressibility and flow on color Doppler imaging. Other Findings: Heterogeneously echoic cystic structure in the left popliteal fossa measuring as large as 6 cm. Lower extremity edema. IMPRESSION: Right: Survey is positive for nonocclusive DVT involving proximal right femoral vein, new from the comparison duplex survey. Edema Left: Survey of the left lower extremity negative for DVT. Likely Baker cyst in the left popliteal fossa. Edema Technologist performing the exam will contact the ordering physician in the emergency  department. Signed, Dulcy Fanny. Earleen Newport, DO [facility] Radiology Electronically Signed   By: Corrie Mckusick D.O.   On: 09/26/2016 14:03   Dg Chest Portable 1 View  Result Date: 09/26/2016 CLINICAL DATA:  Left leg pain and swelling today. EXAM: PORTABLE CHEST 1 VIEW COMPARISON:  09/19/2016 and prior exam FINDINGS: Cardiomegaly, right hemidiaphragm elevation and right basilar scarring again noted. There is no evidence of focal airspace disease, pulmonary edema, suspicious pulmonary nodule/mass, pleural effusion, or pneumothorax. No acute bony abnormalities are identified. IMPRESSION: Cardiomegaly without evidence of acute cardiopulmonary disease. Electronically Signed   By: Margarette Canada M.D.   On: 09/26/2016 12:11   ASSESSMENT AND PLAN:  Mikena Eugene is a 82 y.o. female has a past medical history significant for ESRD on HD, chronic a-fib, and thrombocytopenia s/p recent right knee surgery who presents from SNF with worsening LE edema and pain. In ER, pt appears to be volume compensated but LE US reveals acute DVT on right and Baker's cyst on left. Denies fever. No Cp. Does have chronic DOE.   1. Acute right lower extremity DVT appears secondary to immobilization secondary to right lateral femoral condyle fracture which has immobilizer -IV heparin drip -Patient has chronic anemia chronic thrombocytopenia in the setting of end-stage renal disease and is chronically ill -Vascular consult to see if patient is a candidate for filter placement given her risk of bleeding and multiple falls since April 2017 -If patient is not a candidate for filter placement and will start Coumadin patient and husband understands risk of bleeding  2. Right popliteal Baker's cyst symptomatic management  3. End stage renal disease on hemodialysis Nephrology consultation for resuming in-house dialysis  4. Chronic A. fib rate controlled. Patient is not on chronic anticoagulation  due to chronic anemia, cytopenia and risk of bleeding with falls -Continue cardiac meds  5. Bilateral lower extremity edema dependent chronic  6. DVT prophylaxis patient is on IV heparin drip Case discussed with Care Management/Social Worker. Management plans discussed with the patient, family and they are in agreement.  CODE STATUS: Full  DVT Prophylaxis: Heparin drip  TOTAL TIME TAKING CARE OF THIS PATIENT: 30 minutes.  >50% time spent on counselling and coordination of care pt ,husband, Drs fath and lateef POSSIBLE D/C IN 2-3 DAYS, DEPENDING ON CLINICAL CONDITION.  Note: This dictation was prepared with Dragon dictation along with smaller phrase technology. Any transcriptional errors that result from this process are unintentional.  Ayra Hodgdon M.D on 09/27/2016 at 3:24 PM  Between 7am to 6pm - Pager - [phone]  After 6pm go to [email or web address] - password [email or web address] [facility]  Office  [phone]  CC: Primary care physician; Rusty Aus, MD

## 2016-09-27 NOTE — Progress Notes (Signed)
Hd tx start  

## 2016-09-27 NOTE — Consult Note (Signed)
[facility] Consult Note  MRN : [id number]  [name] is a 82 y.o. (Sep 29, 1934) female who presents with chief complaint of  Chief Complaint  Patient presents with  . Leg Swelling  .  History of Present Illness: I am asked by Dr. Doy Hutching to see the patient regarding a DVT and consideration for IVC filter.  The patient is admitted with multiple ongoing issues but has developed significant right lower extremity swelling worse than her baseline. Duplex ultrasound showed what appeared to be a nonocclusive DVT in the right femoral vein. This was not seen on the duplex 2 weeks ago which would suggest that it is acute in nature She had a previous history of DVT in the past, has chronic leg swelling from a variety of reasons including renal failure, obesity, and immobility. She has a dialysis access which is currently functioning well and is required multiple previous interventions. The pain in her leg is moderate at this point. She does not have open ulceration. She has no sign of infection. She has no fever or chills. She has a chronically low platelet count and chronic anemia from disease and is not felt to be a good candidate for long-term anticoagulation. She is on heparin currently without obvious signs of bleeding, but cardiology is seeing her and she is also not felt to be for anticoagulation for her atrial fibrillation secondary to the above issues. For these reasons, an IVC filter is requested. The patient is recently completed dialysis and is somewhat somnolent, but does answer questions appropriately.  Current Facility-Administered Medications  Medication Dose Route Frequency Provider Last Rate Last Dose  . 0.9 %  sodium chloride infusion  100 mL Intravenous PRN Munsoor Lateef, MD      . 0.9 %  sodium chloride infusion  100 mL Intravenous PRN Munsoor Lateef, MD      . acetaminophen (TYLENOL) tablet 650 mg  650 mg Oral Q6H PRN Idelle Crouch, MD       Or   . acetaminophen (TYLENOL) suppository 650 mg  650 mg Rectal Q6H PRN Idelle Crouch, MD      . allopurinol (ZYLOPRIM) tablet 100 mg  100 mg Oral QHS Idelle Crouch, MD   100 mg at 09/26/16 2040  . alteplase (CATHFLO ACTIVASE) injection 2 mg  2 mg Intracatheter Once PRN Munsoor Lateef, MD      . bisacodyl (DULCOLAX) suppository 10 mg  10 mg Rectal Daily PRN Idelle Crouch, MD      . calcium acetate (PHOSLO) capsule 667 mg  667 mg Oral BID BM Idelle Crouch, MD   667 mg at 09/27/16 1033  . cholecalciferol (VITAMIN D) tablet 1,000 Units  1,000 Units Oral QHS Idelle Crouch, MD   1,000 Units at 09/26/16 2040  . docusate sodium (COLACE) capsule 100 mg  100 mg Oral BID Idelle Crouch, MD   100 mg at 09/27/16 1032  . epoetin alfa (EPOGEN,PROCRIT) injection 4,000 Units  4,000 Units Intravenous Q M,W,F-HD Munsoor Lateef, MD   4,000 Units at 09/27/16 1505  . fluticasone (FLONASE) 50 MCG/ACT nasal spray 2 spray  2 spray Each Nare QHS Idelle Crouch, MD   2 spray at 09/26/16 2045  . heparin ADULT infusion 100 units/mL (25000 units/230mL sodium chloride 0.45%)  1,050 Units/hr Intravenous Continuous Melissa D Maccia, RPH 10.5 mL/hr at 09/27/16 0800 1,050 Units/hr at 09/27/16 0800  . heparin injection 1,000 Units  1,000 Units Dialysis PRN  Munsoor Lateef, MD      . insulin aspart (novoLOG) injection 0-9 Units  0-9 Units Subcutaneous TID WC Idelle Crouch, MD   2 Units at 09/27/16 1216  . insulin glargine (LANTUS) injection 8 Units  8 Units Subcutaneous QHS Idelle Crouch, MD   8 Units at 09/26/16 2120  . ipratropium-albuterol (DUONEB) 0.5-2.5 (3) MG/3ML nebulizer solution 3 mL  3 mL Nebulization QID Idelle Crouch, MD   3 mL at 09/27/16 1141  . lidocaine (PF) (XYLOCAINE) 1 % injection 5 mL  5 mL Intradermal PRN Munsoor Lateef, MD      . lidocaine-prilocaine (EMLA) cream 1 application  1 application Topical PRN Munsoor Lateef, MD      . midodrine (PROAMATINE) tablet 10 mg  10 mg Oral BID WC  Munsoor Lateef, MD      . multivitamin with minerals tablet 1 tablet  1 tablet Oral QHS Idelle Crouch, MD   1 tablet at 09/26/16 2039  . nystatin cream (MYCOSTATIN) 1 application  1 application Topical BID PRN Idelle Crouch, MD      . ondansetron [facility]) tablet 4 mg  4 mg Oral Q6H PRN Idelle Crouch, MD       Or  . ondansetron [facility]) injection 4 mg  4 mg Intravenous Q6H PRN Idelle Crouch, MD      . pantoprazole (PROTONIX) EC tablet 40 mg  40 mg Oral QHS Idelle Crouch, MD   40 mg at 09/26/16 2040  . pentafluoroprop-tetrafluoroeth (GEBAUERS) aerosol 1 application  1 application Topical PRN Munsoor Lateef, MD      . polyethylene glycol (MIRALAX / GLYCOLAX) packet 17 g  17 g Oral QHS Idelle Crouch, MD   17 g at 09/26/16 2038  . propranolol (INDERAL) tablet 20 mg  20 mg Oral BID Idelle Crouch, MD   20 mg at 09/26/16 2040  . QUEtiapine (SEROQUEL) tablet 25 mg  25 mg Oral QHS Idelle Crouch, MD   25 mg at 09/26/16 2040  . senna (SENOKOT) tablet 17.2 mg  2 tablet Oral Daily PRN Idelle Crouch, MD      . sevelamer carbonate (RENVELA) tablet 1,600 mg  1,600 mg Oral TID WC Idelle Crouch, MD   1,600 mg at 09/27/16 1216  . sevelamer carbonate (RENVELA) tablet 800 mg  800 mg Oral BID Idelle Crouch, MD   800 mg at 09/27/16 1033  . sodium chloride flush (NS) 0.9 % injection 3 mL  3 mL Intravenous Q12H Idelle Crouch, MD   3 mL at 09/27/16 0900  . traMADol (ULTRAM) tablet 50 mg  50 mg Oral Q8H PRN Fritzi Mandes, MD      . traZODone (DESYREL) tablet 50 mg  50 mg Oral QHS Idelle Crouch, MD   50 mg at 09/26/16 2040    Past Medical History:  Diagnosis Date  . A-fib (HCC)    not on anticoagulation  . Anemia   . Bilateral lower extremity edema   . CHF (congestive heart failure) ([place])   . Diabetes mellitus without complication [facility])    Patient takes Insulin  . Dysrhythmia   . ESRD (end stage renal disease) ([place])    Monday, wednesday, Friday DIalysis  . Essential tremor   .  HTN (hypertension)   . OSA (obstructive sleep apnea)   . Osteoarthritis   . Renal insufficiency    Patient is on dialysis and normal days are M,W and F.  Marland Kitchen  Skin cancer    Resected from legs    Past Surgical History:  Procedure Laterality Date  . ABDOMINAL HYSTERECTOMY    . ANKLE FRACTURE SURGERY     right ankle  . CHOLECYSTECTOMY    . ESOPHAGOGASTRODUODENOSCOPY (EGD) WITH PROPOFOL N/A 10/09/2015   Procedure: ESOPHAGOGASTRODUODENOSCOPY (EGD) WITH PROPOFOL;  Surgeon: Hulen Luster, MD;  Location: [facility] ENDOSCOPY;  Service: Gastroenterology;  Laterality: N/A;  . FEMUR FRACTURE SURGERY     left femur  . KNEE ARTHROPLASTY    . NEUROPLASTY / TRANSPOSITION MEDIAN NERVE AT CARPAL TUNNEL BILATERAL    . PERIPHERAL VASCULAR CATHETERIZATION Left 08/06/2015   Procedure: A/V Shuntogram/Fistulagram;  Surgeon: Algernon Huxley, MD;  Location: [place] CV LAB;  Service: Cardiovascular;  Laterality: Left;  . PERIPHERAL VASCULAR CATHETERIZATION Left 11/20/2015   Procedure: A/V Shuntogram/Fistulagram;  Surgeon: Algernon Huxley, MD;  Location: [place] CV LAB;  Service: Cardiovascular;  Laterality: Left;  . PERIPHERAL VASCULAR CATHETERIZATION N/A 11/20/2015   Procedure: A/V Shunt Intervention;  Surgeon: Algernon Huxley, MD;  Location: [place] CV LAB;  Service: Cardiovascular;  Laterality: N/A;  . TUBAL LIGATION      Social History Social History  Substance Use Topics  . Smoking status: Never Smoker  . Smokeless tobacco: Never Used  . Alcohol use No  married, lives with husband  Family History Family History  Problem Relation Age of Onset  . Diabetes type II Father   . Breast cancer Sister 20  . Breast cancer Maternal Grandmother   no known bleeding or clotting issues  Allergies  Allergen Reactions  . Angiotensin Receptor Blockers Other (See Comments)    Reaction:  Hyperkalemia  . Penicillins Rash and Other (See Comments)    rash     REVIEW OF SYSTEMS (Negative unless  checked)  Constitutional: [] Weight loss  [] Fever  [] Chills Cardiac: [] Chest pain   [] Chest pressure   [] Palpitations   [] Shortness of breath when laying flat   [] Shortness of breath at rest   [x] Shortness of breath with exertion. Vascular:  [] Pain in legs with walking   [] Pain in legs at rest   [] Pain in legs when laying flat   [] Claudication   [] Pain in feet when walking  [] Pain in feet at rest  [] Pain in feet when laying flat   [x] History of DVT   [] Phlebitis   [x] Swelling in legs   [] Varicose veins   [] Non-healing ulcers Pulmonary:   [] Uses home oxygen   [] Productive cough   [] Hemoptysis   [] Wheeze  [] COPD   [] Asthma Neurologic:  [] Dizziness  [] Blackouts   [] Seizures   [] History of stroke   [] History of TIA  [] Aphasia   [] Temporary blindness   [] Dysphagia   [] Weakness or numbness in arms   [] Weakness or numbness in legs Musculoskeletal:  [] Arthritis   [] Joint swelling   [] Joint pain   [] Low back pain Hematologic:  [x] Easy bruising  [] Easy bleeding   [] Hypercoagulable state   [x] Anemic  [] Hepatitis Gastrointestinal:  [] Blood in stool   [] Vomiting blood  [] Gastroesophageal reflux/heartburn   [] Difficulty swallowing. Genitourinary:  [x] Chronic kidney disease   [] Difficult urination  [] Frequent urination  [] Burning with urination   [] Blood in urine Skin:  [] Rashes   [] Ulcers   [] Wounds Psychological:  [] History of anxiety   []  History of major depression.  Physical Examination  Vitals:   09/27/16 1520 09/27/16 1550 09/27/16 1600 09/27/16 1605  BP: (!) 84/60 (!) 80/44 (!) 90/59 (!) 94/52  Pulse: (!) 103 99 (!)  104 (!) 103  Resp: 20 14 18  (!) 23  Temp:   98.2 F (36.8 C)   TempSrc:   Axillary   SpO2: 100% 100% 100% 100%  Weight:   103.9 kg (229 lb 0.9 oz)   Height:       Body mass index is 43.28 kg/m. Gen:  WD/WN, debilitated appearing. NAD Head: [place]/AT, No temporalis wasting. Prominent temp pulse not noted. Ear/Nose/Throat: Hearing grossly intact, nares w/o erythema or drainage,  oropharynx w/o Erythema/Exudate Eyes: sclera non-icteric, conjunctiva clear. Neck: Supple, no nuchal rigidity.  No JVD.  Pulmonary:  Good air movement, no use or accessory muscles Cardiac: irregularly irregular Vascular: AVF in left arm with good thrill and bruit Vessel Right Left  Radial Palpable Palpable                                   Gastrointestinal: soft, non-tender/non-distended. No guarding/reflex. No masses, surgical incisions, or scars. Musculoskeletal: M/S 5/5 throughout.  Extremities without ischemic changes.  No deformity or atrophy. 2+ RLE edema. 1+ LLE edema Neurologic: CN 2-12 intact. Pain and light touch intact in extremities.  Symmetrical.  Speech is fluent. Motor exam as listed above. Psychiatric: Judgment intact, Mood & affect appropriate for pt's clinical situation. Dermatologic: No rashes or ulcers noted.  No cellulitis or open wounds. Lymph : No Cervical, Axillary, or Inguinal lymphadenopathy.   CBC Lab Results  Component Value Date   WBC 8.2 09/27/2016   HGB 10.6 (L) 09/27/2016   HCT 31.4 (L) 09/27/2016   MCV 102.0 (H) 09/27/2016   PLT 106 (L) 09/27/2016    BMET    Component Value Date/Time   NA 128 (L) 09/27/2016 0103   NA 130 (L) 03/10/2015 0426   K 3.9 09/27/2016 0103   K 5.3 (H) 03/10/2015 0811   CL 93 (L) 09/27/2016 0103   CL 91 (L) 03/10/2015 0426   CO2 29 09/27/2016 0103   CO2 26 03/10/2015 0426   GLUCOSE 191 (H) 09/27/2016 0103   GLUCOSE 85 03/10/2015 0426   BUN 28 (H) 09/27/2016 0103   BUN 41 (H) 03/10/2015 0426   CREATININE 3.77 (H) 09/27/2016 0103   CREATININE 6.65 (H) 03/10/2015 0426   CALCIUM 8.7 (L) 09/27/2016 0103   CALCIUM 7.6 (L) 03/10/2015 0426   GFRNONAA 10 (L) 09/27/2016 0103   GFRNONAA 5 (L) 03/10/2015 0426   GFRAA 12 (L) 09/27/2016 0103   GFRAA 6 (L) 03/10/2015 0426   Estimated Creatinine Clearance: 13.2 mL/min (by C-G formula based on SCr of 3.77 mg/dL (H)).  COAG Lab Results  Component Value Date   INR  1.11 09/26/2016   INR 1.24 09/10/2016   INR 1.16 08/04/2015    Radiology Dg Chest 2 View  Result Date: 09/09/2016 CLINICAL DATA:  [place] tonight. EXAM: CHEST  2 VIEW COMPARISON:  04/07/2016 FINDINGS: The heart is enlarged but stable. Mediastinal and hilar contours are slightly prominent but unchanged. The lungs are clear of acute process. Stable right basilar scarring changes mild chronic right-sided pleural thickening. Remote healed rib fractures are noted on the left side. No definite acute fractures. IMPRESSION: Stable cardiac enlargement and chronic lung changes but no acute pulmonary findings. Remote healed left-sided rib fractures. No definite acute fractures. Electronically Signed   By: Marijo Sanes M.D.   On: 09/09/2016 19:00   Dg Tibia/fibula Left  Result Date: 09/09/2016 CLINICAL DATA:  [place] tonight at home.  Left leg pain.  EXAM: LEFT TIBIA AND FIBULA - 2 VIEW COMPARISON:  None FINDINGS: Healed/ healing proximal fibular shaft fracture with callus formation. Small fracture line persists. No acute fracture of the tibia or fibular shaft. Ankle fractures are noted. See ankle films. IMPRESSION: Ankle fractures but no acute tibial or fibular shaft fractures. Healed/healing proximal fibular shaft fracture. Electronically Signed   By: Marijo Sanes M.D.   On: 09/09/2016 19:10   Dg Ankle Complete Left  Result Date: 09/09/2016 CLINICAL DATA:  [place] tonight at home.  Left ankle pain. EXAM: LEFT ANKLE COMPLETE - 3+ VIEW COMPARISON:  04/06/2016 FINDINGS: There are tri malleolar fractures of the ankle but these were also present on the prior film from April. No obvious acute fractures. IMPRESSION: Healing/ healed tri malleolar fractures of the ankle. No obvious acute fractures. Electronically Signed   By: Marijo Sanes M.D.   On: 09/09/2016 19:15   Ct Angio Chest Pe W Or Wo Contrast  Result Date: 09/10/2016 CLINICAL DATA:  Acute respiratory failure with hypoxia. EXAM: CT ANGIOGRAPHY CHEST WITH CONTRAST  TECHNIQUE: Multidetector CT imaging of the chest was performed using the standard protocol during bolus administration of intravenous contrast. Multiplanar CT image reconstructions and MIPs were obtained to evaluate the vascular anatomy. CONTRAST:  1 ISOVUE-300 IOPAMIDOL (ISOVUE-300) INJECTION 61% COMPARISON:  04/06/2016 FINDINGS: Cardiovascular: Moderate cardiac enlargement. Calcifications involving the thoracic aorta noted. Left circumflex coronary artery calcifications. Mitral valve calcifications noted. No pericardial effusion. Pulmonary artery detail diminished secondary to respiratory motion artifact. There is no central pulmonary artery filling defect identified. No lobar or segmental pulmonary artery filling defects identified to suggest a clinically significant acute pulmonary embolus. Mediastinum/Nodes: The trachea appears patent and is midline. Normal appearance of the esophagus. No mediastinal or hilar adenopathy. Lungs/Pleura: There is no pleural effusion identified. No interstitial edema identified. Subpleural consolidation in atelectasis is identified within the right lower lobe. Upper Abdomen: There is reflux of contrast material from the right side of heart into the IVC and hepatic veins compatible with passive venous congestion. No acute findings identified within the upper abdomen. Musculoskeletal: Spondylosis is identified within the thoracic spine. Chronic posttraumatic deformity involving the sternum noted. Review of the MIP images confirms the above findings. IMPRESSION: 1. Exam detail diminished by respiratory motion artifact. Within this limitation there is no evidence to suggest an acute pulmonary embolus. 2. Left lower lobe subpleural consolidation and atelectasis. Findings may reflect an inflammatory or infectious process. 3. Aortic atherosclerosis and coronary artery calcifications Electronically Signed   By: Kerby Moors M.D.   On: 09/10/2016 17:38   US Venous Img Lower  Bilateral  Result Date: 09/26/2016 CLINICAL DATA:  82 year old female with a history of pain and swelling bilateral legs. EXAM: BILATERAL LOWER EXTREMITY VENOUS DOPPLER ULTRASOUND TECHNIQUE: Gray-scale sonography with graded compression, as well as color Doppler and duplex ultrasound were performed to evaluate the lower extremity deep venous systems from the level of the common femoral vein and including the common femoral, femoral, profunda femoral, popliteal and calf veins including the posterior tibial, peroneal and gastrocnemius veins when visible. The superficial great saphenous vein was also interrogated. Spectral Doppler was utilized to evaluate flow at rest and with distal augmentation maneuvers in the common femoral, femoral and popliteal veins. COMPARISON:  09/11/2016 FINDINGS: RIGHT LOWER EXTREMITY Common Femoral Vein: No evidence of thrombus. Normal compressibility, respiratory phasicity and response to augmentation. Saphenofemoral Junction: No evidence of thrombus. Normal compressibility and flow on color Doppler imaging. Profunda Femoral Vein: No evidence of thrombus. Normal  compressibility and flow on color Doppler imaging. Femoral Vein: Nonocclusive thrombus of the proximal right femoral vein, new from the prior. Popliteal Vein: No evidence of thrombus. Normal compressibility, respiratory phasicity and response to augmentation. Calf Veins: No evidence of thrombus. Normal compressibility and flow on color Doppler imaging. Superficial Great Saphenous Vein: No evidence of thrombus. Normal compressibility and flow on color Doppler imaging. Other Findings:  Edema of the right lower extremity. LEFT LOWER EXTREMITY Common Femoral Vein: No evidence of thrombus. Normal compressibility, respiratory phasicity and response to augmentation. Saphenofemoral Junction: No evidence of thrombus. Normal compressibility and flow on color Doppler imaging. Profunda Femoral Vein: No evidence of thrombus. Normal  compressibility and flow on color Doppler imaging. Femoral Vein: No evidence of thrombus. Normal compressibility, respiratory phasicity and response to augmentation. Popliteal Vein: No evidence of thrombus. Normal compressibility, respiratory phasicity and response to augmentation. Calf Veins: No evidence of thrombus. Normal compressibility and flow on color Doppler imaging. Superficial Great Saphenous Vein: No evidence of thrombus. Normal compressibility and flow on color Doppler imaging. Other Findings: Heterogeneously echoic cystic structure in the left popliteal fossa measuring as large as 6 cm. Lower extremity edema. IMPRESSION: Right: Survey is positive for nonocclusive DVT involving proximal right femoral vein, new from the comparison duplex survey. Edema Left: Survey of the left lower extremity negative for DVT. Likely Baker cyst in the left popliteal fossa. Edema Technologist performing the exam will contact the ordering physician in the emergency department. Signed, Dulcy Fanny. Earleen Newport, DO [facility] Radiology Electronically Signed   By: Corrie Mckusick D.O.   On: 09/26/2016 14:03   US Venous Img Lower Bilateral  Result Date: 09/11/2016 CLINICAL DATA:  Bilateral lower extremity edema. History of CHF and varicose veins. History of skin cancer. Evaluate for DVT EXAM: BILATERAL LOWER EXTREMITY VENOUS DOPPLER ULTRASOUND TECHNIQUE: Gray-scale sonography with graded compression, as well as color Doppler and duplex ultrasound were performed to evaluate the lower extremity deep venous systems from the level of the common femoral vein and including the common femoral, femoral, profunda femoral, popliteal and calf veins including the posterior tibial, peroneal and gastrocnemius veins when visible. The superficial great saphenous vein was also interrogated. Spectral Doppler was utilized to evaluate flow at rest and with distal augmentation maneuvers in the common  femoral, femoral and popliteal veins. COMPARISON:  None. FINDINGS: Examination is degraded due to patient body habitus, subcutaneous edema and poor sonographic window. RIGHT LOWER EXTREMITY Common Femoral Vein: No evidence of thrombus. Normal compressibility, respiratory phasicity and response to augmentation. Saphenofemoral Junction: No evidence of thrombus. Normal compressibility and flow on color Doppler imaging. Profunda Femoral Vein: No evidence of thrombus. Normal compressibility and flow on color Doppler imaging. Femoral Vein: No evidence of thrombus. Normal compressibility, respiratory phasicity and response to augmentation. Popliteal Vein: No evidence of thrombus. Normal compressibility, respiratory phasicity and response to augmentation. Calf Veins: No evidence of thrombus. Normal compressibility and flow on color Doppler imaging. Superficial Great Saphenous Vein: No evidence of thrombus. Normal compressibility and flow on color Doppler imaging. Venous Reflux:  None. Other Findings:  None. LEFT LOWER EXTREMITY Common Femoral Vein: No evidence of thrombus. Normal compressibility, respiratory phasicity and response to augmentation. Saphenofemoral Junction: No evidence of thrombus. Normal compressibility and flow on color Doppler imaging. Profunda Femoral Vein: No evidence of thrombus. Normal compressibility and flow on color Doppler imaging. Femoral Vein: No evidence of thrombus. Normal compressibility, respiratory phasicity and response to augmentation. Popliteal Vein: No evidence of thrombus. Normal  compressibility, respiratory phasicity and response to augmentation. Calf Veins: Suboptimally visualized. Superficial Great Saphenous Vein: No evidence of thrombus. Normal compressibility and flow on color Doppler imaging. Venous Reflux:  None. Other Findings:  None. IMPRESSION: No evidence of DVT within either lower extremity. Electronically Signed   By: Sandi Mariscal M.D.   On: 09/11/2016 08:43   Dg Chest  Portable 1 View  Result Date: 09/26/2016 CLINICAL DATA:  Left leg pain and swelling today. EXAM: PORTABLE CHEST 1 VIEW COMPARISON:  09/19/2016 and prior exam FINDINGS: Cardiomegaly, right hemidiaphragm elevation and right basilar scarring again noted. There is no evidence of focal airspace disease, pulmonary edema, suspicious pulmonary nodule/mass, pleural effusion, or pneumothorax. No acute bony abnormalities are identified. IMPRESSION: Cardiomegaly without evidence of acute cardiopulmonary disease. Electronically Signed   By: Margarette Canada M.D.   On: 09/26/2016 12:11   Dg Chest Port 1 View  Result Date: 09/19/2016 CLINICAL DATA:  End-stage renal disease. Recent pneumonia. Hypertension. EXAM: PORTABLE CHEST 1 VIEW COMPARISON:  September 11, 2016 FINDINGS: There is patchy atelectasis in the right base, stable. No new opacity. There is cardiomegaly with mild pulmonary venous hypertension. No adenopathy. There is atherosclerotic calcification in the aorta. Central catheter has been removed. No pneumothorax. IMPRESSION: Stable atelectasis right base. No new opacity. Stable cardiomegaly with pulmonary venous hypertension indicative of a degree of pulmonary vascular congestion. Aortic atherosclerosis. No pneumothorax. Electronically Signed   By: Lowella Grip III M.D.   On: 09/19/2016 07:36   Dg Chest Port 1 View  Result Date: 09/11/2016 CLINICAL DATA:  Acute respiratory failure EXAM: PORTABLE CHEST 1 VIEW COMPARISON:  September 10, 2016 FINDINGS: Stable right IJ. Stable opacity in the right base with platelike configuration. Elevation of the right hemidiaphragm is unchanged as well. Stable cardiomegaly. The hila and mediastinum are otherwise unchanged. No other interval changes. IMPRESSION: No interval changes. Stable opacity in the right lung base most consistent with atelectasis. Electronically Signed   By: Dorise Bullion III M.D   On: 09/11/2016 07:28   Dg Chest Port 1 View  Result Date:  09/10/2016 CLINICAL DATA:  Acute onset of hypoxia.  Initial encounter. EXAM: PORTABLE CHEST 1 VIEW COMPARISON:  Chest radiograph and CTA of the chest performed earlier today at 2:06 p.m. and 5:10 p.m. FINDINGS: A right IJ line is noted ending about the mid SVC. The lungs are hypoexpanded. Mild right basilar atelectasis is noted. There is no evidence of pleural effusion or pneumothorax. The cardiomediastinal silhouette is mildly enlarged. No acute osseous abnormalities are seen. IMPRESSION: 1. Right IJ line noted ending about the mid SVC. 2. Lungs hypoexpanded.  Mild right basilar atelectasis noted. 3. Mild cardiomegaly. Electronically Signed   By: Garald Balding M.D.   On: 09/10/2016 23:21   Dg Chest Port 1 View  Result Date: 09/10/2016 CLINICAL DATA:  Encounter for line placement. Atrial fibrillation, CHF, diabetes and hypertension. EXAM: PORTABLE CHEST 1 VIEW COMPARISON:  Chest x-ray from earlier same day. FINDINGS: Compared to plain film from earlier same day, the right IJ central line has been advanced with tip now at the level of the upper SVC. Cardiomegaly appears stable. Overall cardiomediastinal silhouette appears stable in size and configuration. Mild atelectasis again noted at the right lung base. Lungs otherwise clear. No pleural effusion or pneumothorax seen. Osseous structures about the chest are unremarkable. IMPRESSION: 1. Right IJ central line slightly advanced in the interval with tip now at the level of the upper SVC. Would consider additional line advancement of approximately  5 cm for more optimal radiographic positioning at the expected location of the cavoatrial junction. 2. Stable cardiomegaly. 3. Stable mild atelectasis at the right lung base. Lungs otherwise clear. No pneumothorax. Electronically Signed   By: Franki Cabot M.D.   On: 09/10/2016 14:40   Dg Chest Port 1 View  Result Date: 09/10/2016 CLINICAL DATA:  Central line placement. EXAM: PORTABLE CHEST 1 VIEW COMPARISON:   09/10/2016 chest x-ray FINDINGS: The right IJ central venous catheter tip is in the proximal SVC. The cardiac silhouette, mediastinal and hilar contours are stable. The lungs are stable. No pneumothorax. IMPRESSION: Right IJ central venous catheter tip is in the proximal SVC. Electronically Signed   By: Marijo Sanes M.D.   On: 09/10/2016 12:22   Dg Chest Port 1 View  Result Date: 09/10/2016 CLINICAL DATA:  Acute respiratory failure EXAM: PORTABLE CHEST 1 VIEW COMPARISON:  09/09/2016 FINDINGS: Cardiac enlargement. Negative for heart failure. Negative for edema. Small right pleural effusion or pleural scarring is similar to prior studies. Mild elevation right hemidiaphragm with mild right lower lobe atelectasis similar to prior studies. Little change from yesterday IMPRESSION: No change from yesterday. Right lower lobe atelectasis/ scarring and small right pleural effusion unchanged. Electronically Signed   By: Franchot Gallo M.D.   On: 09/10/2016 09:05   Dg Knee Complete 4 Views Left  Result Date: 09/09/2016 CLINICAL DATA:  [place] tonight at home.  Left knee pain. EXAM: LEFT KNEE - COMPLETE 4+ VIEW COMPARISON:  Radiographs 03/06/2015 FINDINGS: The femoral and tibial components or intact. No acute fractures identified. There is a long lateral compression sideplate and multiple screws transfixing an old distal femur fracture. No complicating features. There is also a remote healed proximal fibular shaft fracture. Extensive vascular calcifications are noted. No definite joint effusion. IMPRESSION: Intact hardware.  No acute fracture.  Remote healed fractures. Electronically Signed   By: Marijo Sanes M.D.   On: 09/09/2016 19:07   Dg Knee Complete 4 Views Right  Result Date: 09/09/2016 CLINICAL DATA:  [place] tonight at home. EXAM: RIGHT KNEE - COMPLETE 4+ VIEW COMPARISON:  None. FINDINGS: There is a periprosthetic fracture involving the lateral femoral condyle with mild displacement. Could not exclude a subtle  fracture of the medial femoral condyle also. There is an associated joint effusion. IMPRESSION: Periprosthetic femoral fracture. Electronically Signed   By: Marijo Sanes M.D.   On: 09/09/2016 19:06      Assessment/Plan 1. Right lower extremity DVT in a patient not felt to be a candidate for long-term anticoagulation. His would be a reasonably strong indication for an IVC filter placement. I then a long discussion today with the patient regarding this finding. Given her inability to be on long-term anticoagulation, I would recommend an IVC filter be placed today. Risks and benefits of filter including migration, fracture, small risk of pulmonary embolus, and filter thrombosis were all discussed. She is agreeable to proceed. If she can tolerate a short-term anticoagulation even for a few days to weeks this may be of some benefit as well. Given her significant immobility she is at risk for worsening DVT without anticoagulation. 2. End-stage renal disease. Fistula is currently working well but has required multiple previous interventions. 3. Atrial fibrillation. Not felt to be a candidate for anticoagulation by cardiology. They're going to try to rate control her with medications. 4. Diabetes. Stable on outpatient medications.   Leotis Pain, MD  09/27/2016 4:38 PM    This note was created with Dragon medical transcription system.  Any error is purely unintentional

## 2016-09-27 NOTE — Progress Notes (Signed)
[facility]  ROUNDING NOTE   Subjective:  Patient well known to [place]. She presents now with a nonocclusive DVT involving the proximal right femoral vein.   Objective:  Vital signs in last 24 hours:  Temp:  [97.3 F (36.3 C)-98 F (36.7 C)] 98 F (36.7 C) (10/16 0732) Pulse Rate:  [93-109] 102 (10/16 0732) Resp:  [16-18] 18 (10/16 0732) BP: (82-119)/(41-67) 82/41 (10/16 0732) SpO2:  [99 %-100 %] 100 % (10/16 0732) Weight:  [99.8 kg (220 lb)-102.5 kg (226 lb)] 102.5 kg (226 lb) (10/16 0500)  Weight change:  Filed Weights   09/26/16 1139 09/27/16 0500  Weight: 99.8 kg (220 lb) 102.5 kg (226 lb)    Intake/Output: I/O last 3 completed shifts: In: 896.3 [P.O.:240; I.V.:606.3; IV Piggyback:50] Out: -    Intake/Output this shift:  No intake/output data recorded.  Physical Exam: General: No acute distress   Head: Normocephalic, atraumatic  Eyes: Anicteric  Neck: Supple, trachea midline  Lungs:  Scattered rhonchi, normal effort   Heart: Irregular, no rubs   Abdomen:  Soft, nontender, BS present   Extremities: Right leg in an immobilizer, 1+ peripheral edema  Neurologic: Alert and oriented, Following commands   Skin: No lesions  Access: LUE AVF    Basic Metabolic Panel:  Recent Labs Lab 09/26/16 1148 09/27/16 0103  NA 130* 128*  K 3.6 3.9  CL 91* 93*  CO2 33* 29  GLUCOSE 209* 191*  BUN 24* 28*  CREATININE 3.39* 3.77*  CALCIUM 8.8* 8.7*  PHOS  --  2.5    Liver Function Tests:  Recent Labs Lab 09/26/16 1148 09/27/16 0103  AST 24 22  ALT 12* 11*  ALKPHOS 162* 148*  BILITOT 1.5* 1.5*  PROT 5.8* 5.4*  ALBUMIN 2.8* 2.6*   No results for input(s): LIPASE, AMYLASE in the last 168 hours. No results for input(s): AMMONIA in the last 168 hours.  CBC:  Recent Labs Lab 09/20/16 1408 09/26/16 1148 09/27/16 0103  WBC 12.9* 8.5 8.2  NEUTROABS  --  5.3  --   HGB 10.6* 11.3* 10.6*  HCT 32.6* 34.3* 31.4*  MCV 104.2* 103.5* 102.0*  PLT 65*  117* 106*    Cardiac Enzymes:  Recent Labs Lab 09/26/16 1148  TROPONINI 0.07*    BNP: Invalid input(s): POCBNP  CBG:  Recent Labs Lab 09/20/16 2104 09/21/16 0713 09/21/16 1139 09/26/16 2114 09/27/16 0727  GLUCAP 125* 148* 236* 192* 126*    Microbiology: Results for orders placed or performed during the hospital encounter of 09/09/16  MRSA PCR Screening     Status: Abnormal   Collection Time: 09/10/16  1:47 AM  Result Value Ref Range Status   MRSA by PCR POSITIVE (A) NEGATIVE Final    Comment:        The GeneXpert MRSA Assay (FDA approved for NASAL specimens only), is one component of a comprehensive MRSA colonization surveillance program. It is not intended to diagnose MRSA infection nor to guide or monitor treatment for MRSA infections. RESULT CALLED TO, READ BACK BY AND VERIFIED WITH: MONIQUE ROBERSON ON 09/10/16 AT 0359 BY TLB   CULTURE, BLOOD (ROUTINE X 2) w Reflex to ID Panel     Status: None   Collection Time: 09/10/16  8:19 AM  Result Value Ref Range Status   Specimen Description BLOOD RIGHT HAND  Final   Special Requests BOTTLES DRAWN AEROBIC AND ANAEROBIC .5ML  Final   Culture NO GROWTH 5 DAYS  Final   Report Status 09/15/2016 FINAL  Final  CULTURE, BLOOD (ROUTINE X 2) w Reflex to ID Panel     Status: None   Collection Time: 09/10/16  6:04 PM  Result Value Ref Range Status   Specimen Description BLOOD RIGHT HAND  Final   Special Requests BOTTLES DRAWN AEROBIC AND ANAEROBIC .5ML  Final   Culture NO GROWTH 5 DAYS  Final   Report Status 09/15/2016 FINAL  Final    Coagulation Studies:  Recent Labs  09/26/16 1635  LABPROT 14.4  INR 1.11    Urinalysis: No results for input(s): COLORURINE, LABSPEC, PHURINE, GLUCOSEU, HGBUR, BILIRUBINUR, KETONESUR, PROTEINUR, UROBILINOGEN, NITRITE, LEUKOCYTESUR in the last 72 hours.  Invalid input(s): APPERANCEUR    Imaging: US Venous Img Lower Bilateral  Result Date: 09/26/2016 CLINICAL DATA:   82 year old female with a history of pain and swelling bilateral legs. EXAM: BILATERAL LOWER EXTREMITY VENOUS DOPPLER ULTRASOUND TECHNIQUE: Gray-scale sonography with graded compression, as well as color Doppler and duplex ultrasound were performed to evaluate the lower extremity deep venous systems from the level of the common femoral vein and including the common femoral, femoral, profunda femoral, popliteal and calf veins including the posterior tibial, peroneal and gastrocnemius veins when visible. The superficial great saphenous vein was also interrogated. Spectral Doppler was utilized to evaluate flow at rest and with distal augmentation maneuvers in the common femoral, femoral and popliteal veins. COMPARISON:  09/11/2016 FINDINGS: RIGHT LOWER EXTREMITY Common Femoral Vein: No evidence of thrombus. Normal compressibility, respiratory phasicity and response to augmentation. Saphenofemoral Junction: No evidence of thrombus. Normal compressibility and flow on color Doppler imaging. Profunda Femoral Vein: No evidence of thrombus. Normal compressibility and flow on color Doppler imaging. Femoral Vein: Nonocclusive thrombus of the proximal right femoral vein, new from the prior. Popliteal Vein: No evidence of thrombus. Normal compressibility, respiratory phasicity and response to augmentation. Calf Veins: No evidence of thrombus. Normal compressibility and flow on color Doppler imaging. Superficial Great Saphenous Vein: No evidence of thrombus. Normal compressibility and flow on color Doppler imaging. Other Findings:  Edema of the right lower extremity. LEFT LOWER EXTREMITY Common Femoral Vein: No evidence of thrombus. Normal compressibility, respiratory phasicity and response to augmentation. Saphenofemoral Junction: No evidence of thrombus. Normal compressibility and flow on color Doppler imaging. Profunda Femoral Vein: No evidence of thrombus. Normal compressibility and flow on color Doppler imaging. Femoral  Vein: No evidence of thrombus. Normal compressibility, respiratory phasicity and response to augmentation. Popliteal Vein: No evidence of thrombus. Normal compressibility, respiratory phasicity and response to augmentation. Calf Veins: No evidence of thrombus. Normal compressibility and flow on color Doppler imaging. Superficial Great Saphenous Vein: No evidence of thrombus. Normal compressibility and flow on color Doppler imaging. Other Findings: Heterogeneously echoic cystic structure in the left popliteal fossa measuring as large as 6 cm. Lower extremity edema. IMPRESSION: Right: Survey is positive for nonocclusive DVT involving proximal right femoral vein, new from the comparison duplex survey. Edema Left: Survey of the left lower extremity negative for DVT. Likely Baker cyst in the left popliteal fossa. Edema Technologist performing the exam will contact the ordering physician in the emergency department. Signed, Dulcy Fanny. Earleen Newport, DO [facility] Electronically Signed   By: Corrie Mckusick D.O.   On: 09/26/2016 14:03   Dg Chest Portable 1 View  Result Date: 09/26/2016 CLINICAL DATA:  Left leg pain and swelling today. EXAM: PORTABLE CHEST 1 VIEW COMPARISON:  09/19/2016 and prior exam FINDINGS: Cardiomegaly, right hemidiaphragm elevation and right basilar scarring again noted. There is  no evidence of focal airspace disease, pulmonary edema, suspicious pulmonary nodule/mass, pleural effusion, or pneumothorax. No acute bony abnormalities are identified. IMPRESSION: Cardiomegaly without evidence of acute cardiopulmonary disease. Electronically Signed   By: Margarette Canada M.D.   On: 09/26/2016 12:11     Medications:   . heparin 1,050 Units/hr (09/26/16 1652)   . allopurinol  100 mg Oral QHS  . calcium acetate  667 mg Oral BID BM  . cholecalciferol  1,000 Units Oral QHS  . docusate sodium  100 mg Oral BID  . famotidine (PEPCID) IV  20 mg Intravenous  Q12H  . fluticasone  2 spray Each Nare QHS  . insulin aspart  0-9 Units Subcutaneous TID WC  . insulin glargine  8 Units Subcutaneous QHS  . ipratropium-albuterol  3 mL Nebulization QID  . multivitamin with minerals  1 tablet Oral QHS  . pantoprazole  40 mg Oral QHS  . polyethylene glycol  17 g Oral QHS  . propranolol  20 mg Oral BID  . QUEtiapine  25 mg Oral QHS  . sevelamer carbonate  1,600 mg Oral TID WC  . sevelamer carbonate  800 mg Oral BID  . sodium chloride flush  3 mL Intravenous Q12H  . traZODone  50 mg Oral QHS  . warfarin  4 mg Oral q1800  . Warfarin - Pharmacist Dosing Inpatient   Does not apply q1800   sodium chloride, sodium chloride, acetaminophen **OR** acetaminophen, alteplase, bisacodyl, heparin, lidocaine (PF), lidocaine-prilocaine, nystatin cream, ondansetron **OR** ondansetron (ZOFRAN) IV, pentafluoroprop-tetrafluoroeth, senna, traMADol  Assessment/ Plan:  82 y.o. white female with end-stage renal disease on hemodialysis, diabetes mellitus type 2, hypertension, obstructive sleep apnea, atrial fibrillation, arthritis, anemia, distal right femoral fracture, admitted on 09/26/2016 for Leg edema [R60.0] Venous thrombosis [I82.90] , DVT  MWF [place]  1.  End-stage renal disease-  We will plan for hemodialysis today. Ultrafiltration target 2.5-3 kg. We may need to consider daily dialysis as she was significantly above her dry weight when she came back to the dialysis center last week.  2.  Hypotension:  - Restart midodrine 10 mg by mouth twice a day.   3.  Anemia of chronic kidney disease - Hemoglobin currently 10.6. We will start the patient on Epogen.  4.  Secondary hyperparathyroidism:  Check intact PTH and phosphorus with dialysis. Continue Renvela.  5. Right LE DVT:  Noted in the right proximal vein. Patient is a high fall risk. Await further input from vascular surgery.    LOS: 1 Cleona Doubleday 10/16/201711:32 AM

## 2016-09-27 NOTE — Consult Note (Signed)
ANTICOAGULATION CONSULT NOTE - Initial Consult  Pharmacy Consult for heparin drip Indication: DVT  Allergies  Allergen Reactions  . Angiotensin Receptor Blockers Other (See Comments)    Reaction:  Hyperkalemia  . Penicillins Rash and Other (See Comments)    rash    Patient Measurements: Height: 5\' 1"  (154.9 cm) Weight: 220 lb (99.8 kg) IBW/kg (Calculated) : 47.8 Heparin Dosing Weight: 71.8kg  Vital Signs: Temp: 97.4 F (36.3 C) (10/15 2319) Temp Source: Axillary (10/15 2319) BP: 94/52 (10/16 0024) Pulse Rate: 95 (10/15 2319)  Labs:  Recent Labs  09/26/16 1148 09/26/16 1635 09/27/16 0103  HGB 11.3*  --  10.6*  HCT 34.3*  --  31.4*  PLT 117*  --  106*  APTT  --  37*  --   LABPROT  --  14.4  --   INR  --  1.11  --   HEPARINUNFRC  --   --  0.33  CREATININE 3.39*  --  3.77*  TROPONINI 0.07*  --   --     Estimated Creatinine Clearance: 12.9 mL/min (by C-G formula based on SCr of 3.77 mg/dL (H)).   Medical History: Past Medical History:  Diagnosis Date  . A-fib (H[facility])    not on anticoagulation  . Anemia   . Bilateral lower extremity edema   . CHF (congestive heart failure) ([place])   . Diabetes mellitus without complication [facility])    Patient takes Insulin  . Dysrhythmia   . ESRD (end stage renal disease) ([place])    Monday, wednesday, Friday DIalysis  . Essential tremor   . HTN (hypertension)   . OSA (obstructive sleep apnea)   . Osteoarthritis   . Renal insufficiency    Patient is on dialysis and normal days are M,W and F.  . Skin cancer    Resected from legs    Medications:  Scheduled:  . allopurinol  100 mg Oral QHS  . calcium acetate  667 mg Oral BID BM  . cholecalciferol  1,000 Units Oral QHS  . docusate sodium  100 mg Oral BID  . famotidine (PEPCID) IV  20 mg Intravenous Q12H  . fluticasone  2 spray Each Nare QHS  . insulin aspart  0-9 Units Subcutaneous TID WC  . insulin glargine  8 Units Subcutaneous QHS  . ipratropium-albuterol  3 mL  Nebulization QID  . multivitamin with minerals  1 tablet Oral QHS  . pantoprazole  40 mg Oral QHS  . polyethylene glycol  17 g Oral QHS  . propranolol  20 mg Oral BID  . QUEtiapine  25 mg Oral QHS  . sevelamer carbonate  1,600 mg Oral TID WC  . sevelamer carbonate  800 mg Oral BID  . sodium chloride flush  3 mL Intravenous Q12H  . traZODone  50 mg Oral QHS    Assessment: Pt is a 82 year old female s/p right knee fracture who presents with a right lower extremity DVT. Med rec shows no home anticoagulation. Baseline labs ordered.  Goal of Therapy:  Heparin level 0.3-0.7 units/ml Monitor platelets by anticoagulation protocol: Yes   Plan:  Give 3600 units bolus x 1 Start heparin infusion at 1050 units/hr Check anti-Xa level in 8 hours and daily while on heparin Continue to monitor H&H and platelets  10/16 0103 HL therapeutic x 1. Continue current rate. Will recheck HL in 8 hours.  Esai Stecklein A. [place], [place].D., BCPS Clinical Pharmacist 09/27/2016,2:25 AM

## 2016-09-27 NOTE — Progress Notes (Signed)
Pt HD tx ended one hour early due to [facility] MD in special procedures needs pt asap. Lateef MD aware. Report to floor RN Nicki Reaper and A. Sharlett Iles, RN. Pt alert, vss, no c/o.

## 2016-09-27 NOTE — Consult Note (Addendum)
[facility]  CARDIOLOGY CONSULT NOTE  Patient ID: [name] MRN: [id number] DOB/AGE: [birth year] 80 y.o.  Admit date: 09/26/2016 Referring Physician Fritzi Mandes MD Primary Physician Dr. Emily Filbert Primary Cardiologist Dr. Nehemiah Massed Reason for Consultation Atrial fibrillation and congestive heart failure  HPI: Patient is an 82 year old female with history of chronic atrial fibrillation, end-stage renal disease on hemodialysis, history of right-sided heart failure who was admitted with increased swelling of her lower extremity. She had not been anticoagulated as an outpatient for her atrial fibrillation due to concerns over bleeding risk. She complains of worsening lower extremity edema and pain. She is status post recent right knee surgery and in the skilled nursing facility. Chest x-ray revealed no pulmonary edema. Lower extremity ultrasound revealed acute DVT on the right with a large Baker's cyst on the left. Cardiogram was read as showing ejection fraction of 45-50% with severe TR consistent with pulmonary hypertension. Chest CT revealed no evidence of pulmonary edema. Patient denies any bleeding history but does have some lower extremity weeping from her lower extremity. She denies chest pain at present. She has been on propranolol for rate control as well as treatment of a benign tremor.  Review of Systems  Constitutional: Negative.   HENT: Negative.   Eyes: Negative.   Respiratory: Positive for shortness of breath.   Cardiovascular: Positive for leg swelling.  Gastrointestinal: Negative.   Genitourinary: Negative.   Musculoskeletal: Negative.   Skin: Positive for rash.  Neurological: Negative.   Endo/Heme/Allergies: Negative.   Psychiatric/Behavioral: Negative.     Past Medical History:  Diagnosis Date  . A-fib (HCC)    not on anticoagulation  . Anemia   . Bilateral lower extremity edema   . CHF (congestive heart failure) ([place])   .  Diabetes mellitus without complication [facility])    Patient takes Insulin  . Dysrhythmia   . ESRD (end stage renal disease) ([place])    Monday, wednesday, Friday DIalysis  . Essential tremor   . HTN (hypertension)   . OSA (obstructive sleep apnea)   . Osteoarthritis   . Renal insufficiency    Patient is on dialysis and normal days are M,W and F.  . Skin cancer    Resected from legs    Family History  Problem Relation Age of Onset  . Diabetes type II Father   . Breast cancer Sister 31  . Breast cancer Maternal Grandmother     Social History   Social History  . Marital status: Married    Spouse name: N/A  . Number of children: N/A  . Years of education: N/A   Occupational History  . Not on file.   Social History Main Topics  . Smoking status: Never Smoker  . Smokeless tobacco: Never Used  . Alcohol use No  . Drug use: No  . Sexual activity: Not on file   Other Topics Concern  . Not on file   Social History Narrative  . No narrative on file    Past Surgical History:  Procedure Laterality Date  . ABDOMINAL HYSTERECTOMY    . ANKLE FRACTURE SURGERY     right ankle  . CHOLECYSTECTOMY    . ESOPHAGOGASTRODUODENOSCOPY (EGD) WITH PROPOFOL N/A 10/09/2015   Procedure: ESOPHAGOGASTRODUODENOSCOPY (EGD) WITH PROPOFOL;  Surgeon: Hulen Luster, MD;  Location: [facility] ENDOSCOPY;  Service: Gastroenterology;  Laterality: N/A;  . FEMUR FRACTURE SURGERY     left femur  . KNEE ARTHROPLASTY    . NEUROPLASTY /  TRANSPOSITION MEDIAN NERVE AT CARPAL TUNNEL BILATERAL    . PERIPHERAL VASCULAR CATHETERIZATION Left 08/06/2015   Procedure: A/V Shuntogram/Fistulagram;  Surgeon: Algernon Huxley, MD;  Location: [place] CV LAB;  Service: Cardiovascular;  Laterality: Left;  . PERIPHERAL VASCULAR CATHETERIZATION Left 11/20/2015   Procedure: A/V Shuntogram/Fistulagram;  Surgeon: Algernon Huxley, MD;  Location: [place] CV LAB;  Service: Cardiovascular;  Laterality: Left;  . PERIPHERAL VASCULAR CATHETERIZATION  N/A 11/20/2015   Procedure: A/V Shunt Intervention;  Surgeon: Algernon Huxley, MD;  Location: [place] CV LAB;  Service: Cardiovascular;  Laterality: N/A;  . TUBAL LIGATION       Prescriptions Prior to Admission  Medication Sig Dispense Refill Last Dose  . allopurinol (ZYLOPRIM) 100 MG tablet Take 100 mg by mouth daily.    09/25/2016 at 2100  . calcium acetate (PHOSLO) 667 MG capsule Take 1 capsule (667 mg total) by mouth 2 (two) times daily between meals. 60 capsule 0 09/25/2016 at 2000  . cholecalciferol (VITAMIN D) 1000 units tablet Take 1,000 Units by mouth daily.   09/25/2016 at 2100  . docusate sodium (COLACE) 100 MG capsule Take 1 capsule (100 mg total) by mouth 2 (two) times daily. 10 capsule 0 09/25/2016 at 2100  . fluticasone (FLONASE) 50 MCG/ACT nasal spray Place 2 sprays into both nostrils at bedtime.   09/25/2016 at 2100  . guaiFENesin (MUCINEX) 600 MG 12 hr tablet Take 1 tablet (600 mg total) by mouth 2 (two) times daily. 20 tablet 0 09/25/2016 at 2100  . insulin glargine (LANTUS) 100 UNIT/ML injection Inject 0.08 mLs (8 Units total) into the skin at bedtime. 10 mL 11 09/25/2016 at 2100  . ipratropium-albuterol (DUONEB) 0.5-2.5 (3) MG/3ML SOLN Take 3 mLs by nebulization 4 (four) times daily as needed (for wheezing/shortness of breath).   09/25/2016 at Unknown time  . lidocaine-prilocaine (EMLA) cream Apply 1 application topically as needed. Apply to access site (left forearm) 1 hour prior to dialysis and cover with clear wrap. Mon, wed, fri 0500   prn at prn  . midodrine (PROAMATINE) 10 MG tablet Take 1 tablet (10 mg total) by mouth 3 (three) times daily with meals. (Patient taking differently: Take 10 mg by mouth 3 (three) times daily with meals. Sun, tue, thu, sat - 0900,1800,2100) 90 tablet 2 Past Week at Unknown time  . Multiple Vitamin (MULTIVITAMIN WITH MINERALS) TABS tablet Take 1 tablet by mouth daily.   09/25/2016 at 2100  . nystatin cream (MYCOSTATIN) Apply 1 application  topically 2 (two) times daily as needed (for itching).    prn at prn  . pantoprazole (PROTONIX) 40 MG tablet Take 40 mg by mouth daily.   09/25/2016 at 2100  . polyethylene glycol (MIRALAX / GLYCOLAX) packet Take 17 g by mouth daily. In 4-8oz fluid of choice   09/25/2016 at 2100  . promethazine (PHENERGAN) 25 MG tablet Take 25 mg by mouth every 6 (six) hours as needed for nausea or vomiting.    prn at prn  . propranolol (INDERAL) 20 MG tablet Take 1 tablet (20 mg total) by mouth 2 (two) times daily. 60 tablet 2 09/25/2016 at 2100  . QUEtiapine (SEROQUEL) 25 MG tablet Take 1 tablet (25 mg total) by mouth at bedtime. 30 tablet 2 09/25/2016 at 2100  . RENVELA 800 MG tablet Take 800-1,600 mg by mouth 5 (five) times daily. Pt takes two tablets three times daily with meals and one tablet two times daily with snacks.   09/25/2016 at 2000  .  senna (SENOKOT) 8.6 MG TABS tablet Take 2 tablets (17.2 mg total) by mouth daily as needed for mild constipation. 30 each 0 prn at prn  . traMADol (ULTRAM) 50 MG tablet Take 1 tablet (50 mg total) by mouth every 6 (six) hours as needed for moderate pain or severe pain. 30 tablet 0 prn at prn    Physical Exam: Blood pressure (!) 82/41, pulse (!) 102, temperature 98 F (36.7 C), resp. rate 18, height 5\' 1"  (1.549 m), weight 102.5 kg (226 lb), SpO2 100 %.   Wt Readings from Last 1 Encounters:  09/27/16 102.5 kg (226 lb)     General appearance: cooperative Resp: clear to auscultation bilaterally Cardio: irregularly irregular rhythm GI: soft, non-tender; bowel sounds normal; no masses,  no organomegaly Extremities: 4+ edema bilaterally. Neurologic: Grossly normal  Labs:   Lab Results  Component Value Date   WBC 8.2 09/27/2016   HGB 10.6 (L) 09/27/2016   HCT 31.4 (L) 09/27/2016   MCV 102.0 (H) 09/27/2016   PLT 106 (L) 09/27/2016    Recent Labs Lab 09/27/16 0103  NA 128*  K 3.9  CL 93*  CO2 29  BUN 28*  CREATININE 3.77*  CALCIUM 8.7*  PROT 5.4*   BILITOT 1.5*  ALKPHOS 148*  ALT 11*  AST 22  GLUCOSE 191*   Lab Results  Component Value Date   CKTOTAL 51 12/07/2013   CKMB 1.2 12/07/2013   TROPONINI 0.07 ([place]) 09/26/2016      Radiology: Evidence of right lower extremity DVT. Chest x-ray showed pulmonary edema. EKG: Atrial fibrillation with variable control of rate.  ASSESSMENT AND PLAN:  Patient is an 82 year old female with multiple medical problems including end-stage renal disease on hemodialysis, history of recent right knee surgery now on a skilled nursing facility. She also has chronic atrial fibrillation which hadn't been treated with anticoagulation due to concern over bleeding. She is now admitted with a right lower extremity DVT likely secondary to inactivity post knee surgery. She is currently on heparin with no obvious bleeding problems. She has severe lower extremity edema with a Baker cyst on the left and a DVT on the right. She has seen vascular surgery with consideration for vena caval filter. Would agree with placement of this. However will need to be considered to be on warfarin regardless due to her atrial fibrillation. Would reattempt warfarin therapy with an INR goal between 2 and 3 after vena cava filter is in place. Would continue propranolol for rate control. Serum troponin elevation is likely secondary to demand. She does not appear to have had an acute coronary event. Chest x-ray did not reveal pulmonary embolus. Signed: Teodoro Spray MD, [facility] 09/27/2016, 10:06 AM  After further review of prior medical records pt appears to be high risk for chronic anticoagulation. Hopefully will be candidate for vena caval filter. If this is placed, would defer warfarin therapy due to multiple risk factors including thrombocytopenia, anemia and fall risk.

## 2016-09-27 NOTE — Progress Notes (Addendum)
ANTICOAGULATION CONSULT NOTE - Initial Consult  Pharmacy Consult for warfarin dosing Indication: DVT  Allergies  Allergen Reactions  . Angiotensin Receptor Blockers Other (See Comments)    Reaction:  Hyperkalemia  . Penicillins Rash and Other (See Comments)    rash    Patient Measurements: Height: 5\' 1"  (154.9 cm) Weight: 226 lb (102.5 kg) IBW/kg (Calculated) : 47.8 Heparin Dosing Weight: 71.8  Vital Signs: Temp: 98 F (36.7 C) (10/16 0732) Temp Source: Axillary (10/15 2319) BP: 82/41 (10/16 0732) Pulse Rate: 102 (10/16 0732)  Labs:  Recent Labs  09/26/16 1148 09/26/16 1635 09/27/16 0103  HGB 11.3*  --  10.6*  HCT 34.3*  --  31.4*  PLT 117*  --  106*  APTT  --  37*  --   LABPROT  --  14.4  --   INR  --  1.11  --   HEPARINUNFRC  --   --  0.33  CREATININE 3.39*  --  3.77*  TROPONINI 0.07*  --   --     Estimated Creatinine Clearance: 13.1 mL/min (by C-G formula based on SCr of 3.77 mg/dL (H)).   Medical History: Past Medical History:  Diagnosis Date  . A-fib (HCC)    not on anticoagulation  . Anemia   . Bilateral lower extremity edema   . CHF (congestive heart failure) ([place])   . Diabetes mellitus without complication [facility])    Patient takes Insulin  . Dysrhythmia   . ESRD (end stage renal disease) ([place])    Monday, wednesday, Friday DIalysis  . Essential tremor   . HTN (hypertension)   . OSA (obstructive sleep apnea)   . Osteoarthritis   . Renal insufficiency    Patient is on dialysis and normal days are M,W and F.  . Skin cancer    Resected from legs    Medications:  Scheduled:  . allopurinol  100 mg Oral QHS  . calcium acetate  667 mg Oral BID BM  . cholecalciferol  1,000 Units Oral QHS  . docusate sodium  100 mg Oral BID  . famotidine (PEPCID) IV  20 mg Intravenous Q12H  . fluticasone  2 spray Each Nare QHS  . insulin aspart  0-9 Units Subcutaneous TID WC  . insulin glargine  8 Units Subcutaneous QHS  . ipratropium-albuterol  3 mL  Nebulization QID  . multivitamin with minerals  1 tablet Oral QHS  . pantoprazole  40 mg Oral QHS  . polyethylene glycol  17 g Oral QHS  . propranolol  20 mg Oral BID  . QUEtiapine  25 mg Oral QHS  . sevelamer carbonate  1,600 mg Oral TID WC  . sevelamer carbonate  800 mg Oral BID  . sodium chloride flush  3 mL Intravenous Q12H  . traZODone  50 mg Oral QHS  . warfarin  4 mg Oral q1800  . Warfarin - Pharmacist Dosing Inpatient   Does not apply q1800   Infusions:  . heparin 1,050 Units/hr (09/26/16 1652)    Assessment: Pharmacy consulted to dose warfarin for DVT in this  82yoF with PMH of ESRD on HD, s/p right knee surgery and chronic Afib not on anticoag PTA. She is currently on heparin drip 1050 U/hr and per MD no obvious bleeding problems. HL 1016 @ 0103 = 0.33  Goal of Therapy:  INR 2-3 Monitor platelets by anticoagulation protocol: Yes   Plan:  Will begin bridging patient to warfarin therapy tonight. Will begin 4 mg daily due to age  and borderline baseline albumin (2.6). Possible interaction with allopurinol. Will continue to monitor CBC and PT/INR.   Date  Dose  INR 10/16  4 mg  1.1 (10/15 pm)   Darrow Bussing, PharmD Pharmacy Resident 09/27/2016 10:19 AM    Afternoon Addendum: Per Dr. Posey Pronto, discussed plan with cardiologist; will hold off on warfarin for tonight and wait for vascular to see pt. Will d/c warfarin orders for tonight and reassess in AM. Either IVC filter vs warfarin. Discussed previously on rounds about pt with anemia and plt count trending down from admission/low (pt has known hx of thrombocytopenia). Will need to continue to monitor CBC closely.   Rayna Sexton, PharmD, BCPS Clinical Pharmacist 09/27/2016 3:05 PM

## 2016-09-27 NOTE — Progress Notes (Signed)
  End of hd 

## 2016-09-27 NOTE — Progress Notes (Signed)
Spoke with Dr. Overton Mam it is ok to hold tonights propranolol tonight. Blood pressure is 90/50

## 2016-09-27 NOTE — Clinical Social Work Note (Signed)
Clinical Social Work Assessment  Patient Details  Name: [name] MRN: [id number] Date of Birth: May 14, 1934  Date of referral:  09/27/16               Reason for consult:  Discharge Planning                Permission sought to share information with:  Family Supports Permission granted to share information::  Yes, Verbal Permission Granted  Name::        Agency::     Relationship::  Garnet Koyanagi- Husband  Contact Information:     Housing/Transportation Living arrangements for the past 2 months:  Single Family Home Source of Information:  Patient, Spouse Patient Interpreter Needed:  None Criminal Activity/Legal Involvement Pertinent to Current Situation/Hospitalization:  No - Comment as needed Significant Relationships:  Other Family Members, Spouse Lives with:  Spouse Do you feel safe going back to the place where you live?  Yes Need for family participation in patient care:  Yes (Comment) Garnet Koyanagi- Husband)  Care giving concerns:  Patient is from [facility] for STR    Social Worker assessment / plan:  CSW met with patient and her husband at discharge. Per patient's husband she is from [facility]. Reported that he'd like her to return at discharge. Requested EMS to transport. Stated she hasn't been there long. Inquired about bed hold. CSW requested [facility]- Admissions at [facility] to speak to patient about bed hold. Patient to return to [facility] at discharge. [facility]us John stated he's able to accept patient back. FL2/ PASRR completed. CSW will continue to follow and assist.   Employment status:  Retired Forensic scientist:  Medicare PT Recommendations:  [place] / Referral to community resources:  [place]  Patient/Family's Response to care:  In agreement to return to [facility] for STR.   Patient/Family's Understanding of and Emotional Response to Diagnosis, Current Treatment, and Prognosis:  Stated they understood. Thanked CSW for assistance.   Emotional  Assessment Appearance:  Appears stated age Attitude/Demeanor/Rapport:   (None) Affect (typically observed):  Accepting, Calm, Pleasant Orientation:  Oriented to Self Alcohol / Substance use:  Not Applicable Psych involvement (Current and /or in the community):  No (Comment)  Discharge Needs  Concerns to be addressed:  Discharge Planning Concerns Readmission within the last 30 days:  No Current discharge risk:  Chronically ill Barriers to Discharge:  Continued Medical Work up   [employer], [place] 09/27/2016, 4:24 PM

## 2016-09-27 NOTE — Progress Notes (Signed)
Pre hd info 

## 2016-09-27 NOTE — Progress Notes (Signed)
Shift assessment completed. Pt is drowsy but easily awakened, husband at bedside. Pt is on room air, lungs are clear/decreased bilat. HR is irregular, abdomen is soft, bs heard. PIV #20 intact to R wrist with heparin drip infusing at 1050untis/hr. PIV #20 intact to Rac with iv ns with 68meq kcl infusing at 50 mls/hr, sitets are free of redness and swelling. L leg is elevated and weeping serous fluid from ankle area, medial shin is purplish in color, 2+ pitting edema, cap refill is wnl to toes. R leg has immobilizer in place, cap refill is wnl. Pt stated both her legs hurt but the l is worse. Since assessment completed, ivf has been dc'd, pt has left the unit for dialysis. Pt received percocet with meds for pain. Dr. Posey Pronto has rounded on pt.

## 2016-09-27 NOTE — Progress Notes (Signed)
Pre hd assessment  

## 2016-09-27 NOTE — Progress Notes (Signed)
Post hd assessment 

## 2016-09-27 NOTE — Op Note (Signed)
[place] [facility]   OPERATIVE NOTE    PRE-OPERATIVE DIAGNOSIS: DVT, anemia, low platelet count and fall risk making patient poor candidate for long term anticoagulation  POST-OPERATIVE DIAGNOSIS: same as above  PROCEDURE: 1.   Ultrasound guidance for vascular access to the right femoral vein 2.   Catheter placement into the inferior vena cava 3.   Inferior venacavogram 4.   Placement of a Cook Celect IVC filter  SURGEON: Leotis Pain, MD  ASSISTANT(S): None  ANESTHESIA: local with Sedation for approximately 15 minutes using 1 mg of Versed   ESTIMATED BLOOD LOSS: minimal  CONTRAST: 5 cc  FLUORO TIME: less than one minute  FINDING(S): 1.  Patent IVC  SPECIMEN(S):  none  INDICATIONS:   [name] is a 82 y.o. female who presents with DVT and felt to be a poor candidate for long term anticoagulation for the reasons listed above.  Inferior vena cava filter is indicated for this reason.  Risks and benefits including filter thrombosis, migration, fracture, bleeding, and infection were all discussed.  We discussed that all IVC filters that we place can be removed if desired from the patient once the need for the filter has passed.    DESCRIPTION: After obtaining full informed written consent, the patient was brought back to the vascular suite. The skin was sterilely prepped and draped in a sterile surgical field was created. Moderate conscious sedation was administered during a face to face encounter with the patient throughout the procedure with my supervision of the RN administering medicines and monitoring the patient's vital signs, pulse oximetry, telemetry and mental status throughout from the start of the procedure until the patient was taken to the recovery room. The right femoral vein was accessed under direct ultrasound guidance without difficulty with a Seldinger needle and a J-wire was then placed. After skin nick and dilatation, the delivery sheath was  placed into the inferior vena cava and an inferior venacavogram was performed. This demonstrated a patent IVC with the level of the renal veins at L1.  The filter was then deployed into the inferior vena cava at the level of L2 just below the renal veins. The delivery sheath was then removed. Pressure was held. Sterile dressings were placed. The patient tolerated the procedure well and was taken to the recovery room in stable condition.  COMPLICATIONS: None  CONDITION: Stable  Leotis Pain  09/27/2016, 5:10 PM   This note was created with Dragon Medical transcription system. Any errors in dictation are purely unintentional.

## 2016-09-28 ENCOUNTER — Encounter: Payer: Self-pay | Admitting: Vascular Surgery

## 2016-09-28 LAB — CBC
HCT: 34 % — ABNORMAL LOW (ref 35.0–47.0)
Hemoglobin: 11.2 g/dL — ABNORMAL LOW (ref 12.0–16.0)
MCH: 34.4 pg — ABNORMAL HIGH (ref 26.0–34.0)
MCHC: 32.8 g/dL (ref 32.0–36.0)
MCV: 104.9 fL — ABNORMAL HIGH (ref 80.0–100.0)
PLATELETS: 110 10*3/uL — AB (ref 150–440)
RBC: 3.24 MIL/uL — AB (ref 3.80–5.20)
RDW: 18.2 % — AB (ref 11.5–14.5)
WBC: 9.5 10*3/uL (ref 3.6–11.0)

## 2016-09-28 LAB — GLUCOSE, CAPILLARY
GLUCOSE-CAPILLARY: 157 mg/dL — AB (ref 65–99)
GLUCOSE-CAPILLARY: 200 mg/dL — AB (ref 65–99)

## 2016-09-28 LAB — PROTIME-INR
INR: 1.05
Prothrombin Time: 13.7 seconds (ref 11.4–15.2)

## 2016-09-28 LAB — HEPATITIS B SURFACE ANTIGEN: HEP B S AG: NEGATIVE

## 2016-09-28 LAB — PARATHYROID HORMONE, INTACT (NO CA): PTH: 51 pg/mL (ref 15–65)

## 2016-09-28 LAB — HEPARIN LEVEL (UNFRACTIONATED): Heparin Unfractionated: 0.25 IU/mL — ABNORMAL LOW (ref 0.30–0.70)

## 2016-09-28 MED ORDER — APIXABAN 5 MG PO TABS
5.0000 mg | ORAL_TABLET | Freq: Two times a day (BID) | ORAL | Status: DC
  Administered 2016-09-28: 5 mg via ORAL

## 2016-09-28 MED ORDER — APIXABAN 5 MG PO TABS: 5.0000 mg | ORAL_TABLET | Freq: Two times a day (BID) | ORAL | 1 refills | Status: DC

## 2016-09-28 MED ORDER — APIXABAN 5 MG PO TABS
10.0000 mg | ORAL_TABLET | Freq: Two times a day (BID) | ORAL | Status: DC
  Filled 2016-09-28: qty 2

## 2016-09-28 MED ORDER — APIXABAN 5 MG PO TABS: 5.0000 mg | ORAL_TABLET | Freq: Two times a day (BID) | ORAL | Status: DC

## 2016-09-28 MED ORDER — HEPARIN BOLUS VIA INFUSION
1100.0000 [IU] | Freq: Once | INTRAVENOUS | Status: AC
  Administered 2016-09-28: 1100 [IU] via INTRAVENOUS
  Filled 2016-09-28: qty 1100

## 2016-09-28 NOTE — Discharge Instructions (Signed)
Resume your HD MWF °

## 2016-09-28 NOTE — Plan of Care (Signed)
Problem: Bowel/Gastric: Goal: Will not experience complications related to bowel motility Outcome: Completed/Met Date Met: 09/28/16 Pt has met goals for discharge.

## 2016-09-28 NOTE — Progress Notes (Signed)
Shift assessment completed at 0745. Pt was easily awakened, cpap removed, pt is alert and oriented x2-3. Pt in no distress, is asking about going to dialysis today, etc. Husband at bedside. Pt is on room air, lungs are clear bilat, hr is irregular, abdomen is soft, bs heard. Pt is wearing incontinence brief. L arm has dressing intact to fistula site, bruit and thrill noted. PIV intact to rac, and PIV intact to r wrist heparin drip is infusing at 1200 units/hr, sites are free of redness and swelling. Pt's R leg has knee immobilizer intact, ppp, cap refill is wnl. Dressing to R groin is intact. L leg is elevated, dressing intact to top of L foot, ppp, cap refill is wnl. Bruising noted to medial aspect of L thigh, and extending down the shin. Pt c/o pain to her legs when she moves them, is able to move them very little.hob is elevated, srx2, call bell in reach.

## 2016-09-28 NOTE — Discharge Summary (Signed)
[facility] NAME: [name]    MR#:  [id number]  DATE OF BIRTH:  26-Apr-1934  DATE OF ADMISSION:  09/26/2016 ADMITTING PHYSICIAN: Idelle Crouch, MD  DATE OF DISCHARGE: 09/28/16  PRIMARY CARE PHYSICIAN: Rusty Aus, MD    ADMISSION DIAGNOSIS:  Leg edema [R60.0] Venous thrombosis [I82.90]  DISCHARGE DIAGNOSIS:  Acute right LE dVT s/p IVC filter placement and on po Eliquis for 6 weeks only Chronic anemia/TCP ESRD on Hd Chronic bilateral LE edema and venous congestion  SECONDARY DIAGNOSIS:   Past Medical History:  Diagnosis Date  . A-fib (HCC)    not on anticoagulation  . Anemia   . Bilateral lower extremity edema   . CHF (congestive heart failure) ([place])   . Diabetes mellitus without complication [facility])    Patient takes Insulin  . Dysrhythmia   . ESRD (end stage renal disease) ([place])    Monday, wednesday, Friday DIalysis  . Essential tremor   . HTN (hypertension)   . OSA (obstructive sleep apnea)   . Osteoarthritis   . Renal insufficiency    Patient is on dialysis and normal days are M,W and F.  . Skin cancer    Resected from legs    HOSPITAL COURSE:  [name] a 82 y.o.femalehas a past medical history significant for ESRD on HD, chronic a-fib, and thrombocytopenia s/p recent right knee surgery who presents from SNF with worsening LE edema and pain. In ER, pt appears to be volume compensated but LE US reveals acute DVT on right and Baker's cyst on left. Denies fever. No Cp. Does have chronic DOE.   1. Acute right lower extremity DVT appears secondary to immobilization secondary to right lateral femoral condyle fracture which has immobilizer -IV heparin drip---change to po eliquis (short term treatment due to anemia and TCP and risk of fall )-per vascular recommendations. This was d/w nephrology as well -Patient has chronic anemia chronic thrombocytopenia in the setting of end-stage renal disease  and is chronically ill -Vascular consult appreciated. Pt is s/p IVC filter placement on oct 16th  2. Right popliteal Baker's cyst symptomatic management  3. End stage renal disease on hemodialysis Nephrology consultation for resuming in-house dialysis  4. Chronic A. fib rate controlled.  -Patient is not on chronic anticoagulation due to chronic anemia, cytopenia and risk of bleeding with falls -Continue cardiac meds  5. Bilateral lower extremity edema dependent chronic  6. DVT prophylaxis patient is on IV heparin drip--changed to po eliquis  D/c plans d/w husband D/c to [facility] today  CONSULTS OBTAINED:  Treatment Team:  Algernon Huxley, MD Munsoor Holley Raring, MD Teodoro Spray, MD Earnestine Leys, MD  DRUG ALLERGIES:   Allergies  Allergen Reactions  . Angiotensin Receptor Blockers Other (See Comments)    Reaction:  Hyperkalemia  . Penicillins Rash and Other (See Comments)    rash    DISCHARGE MEDICATIONS:   Current Discharge Medication List    START taking these medications   Details  apixaban (ELIQUIS) 5 MG TABS tablet Take 1 tablet (5 mg total) by mouth 2 (two) times daily. Qty: 60 tablet, Refills: 1      CONTINUE these medications which have NOT CHANGED   Details  allopurinol (ZYLOPRIM) 100 MG tablet Take 100 mg by mouth daily.     calcium acetate (PHOSLO) 667 MG capsule Take 1 capsule (667 mg total) by mouth 2 (two) times daily between meals. Qty: 60 capsule,  Refills: 0    cholecalciferol (VITAMIN D) 1000 units tablet Take 1,000 Units by mouth daily.    docusate sodium (COLACE) 100 MG capsule Take 1 capsule (100 mg total) by mouth 2 (two) times daily. Qty: 10 capsule, Refills: 0    fluticasone (FLONASE) 50 MCG/ACT nasal spray Place 2 sprays into both nostrils at bedtime.    guaiFENesin (MUCINEX) 600 MG 12 hr tablet Take 1 tablet (600 mg total) by mouth 2 (two) times daily. Qty: 20 tablet, Refills: 0    insulin glargine (LANTUS) 100 UNIT/ML injection Inject  0.08 mLs (8 Units total) into the skin at bedtime. Qty: 10 mL, Refills: 11    ipratropium-albuterol (DUONEB) 0.5-2.5 (3) MG/3ML SOLN Take 3 mLs by nebulization 4 (four) times daily as needed (for wheezing/shortness of breath).    lidocaine-prilocaine (EMLA) cream Apply 1 application topically as needed. Apply to access site (left forearm) 1 hour prior to dialysis and cover with clear wrap. Mon, wed, fri 0500    midodrine (PROAMATINE) 10 MG tablet Take 1 tablet (10 mg total) by mouth 3 (three) times daily with meals. Qty: 90 tablet, Refills: 2    Multiple Vitamin (MULTIVITAMIN WITH MINERALS) TABS tablet Take 1 tablet by mouth daily.    nystatin cream (MYCOSTATIN) Apply 1 application topically 2 (two) times daily as needed (for itching).     pantoprazole (PROTONIX) 40 MG tablet Take 40 mg by mouth daily.    polyethylene glycol (MIRALAX / GLYCOLAX) packet Take 17 g by mouth daily. In 4-8oz fluid of choice    promethazine (PHENERGAN) 25 MG tablet Take 25 mg by mouth every 6 (six) hours as needed for nausea or vomiting.     propranolol (INDERAL) 20 MG tablet Take 1 tablet (20 mg total) by mouth 2 (two) times daily. Qty: 60 tablet, Refills: 2    QUEtiapine (SEROQUEL) 25 MG tablet Take 1 tablet (25 mg total) by mouth at bedtime. Qty: 30 tablet, Refills: 2    RENVELA 800 MG tablet Take 800-1,600 mg by mouth 5 (five) times daily. Pt takes two tablets three times daily with meals and one tablet two times daily with snacks.    senna (SENOKOT) 8.6 MG TABS tablet Take 2 tablets (17.2 mg total) by mouth daily as needed for mild constipation. Qty: 30 each, Refills: 0    traMADol (ULTRAM) 50 MG tablet Take 1 tablet (50 mg total) by mouth every 6 (six) hours as needed for moderate pain or severe pain. Qty: 30 tablet, Refills: 0        If you experience worsening of your admission symptoms, develop shortness of breath, life threatening emergency, suicidal or homicidal thoughts you must seek  medical attention immediately by calling 911 or calling your MD immediately  if symptoms less severe.  You Must read complete instructions/literature along with all the possible adverse reactions/side effects for all the Medicines you take and that have been prescribed to you. Take any new Medicines after you have completely understood and accept all the possible adverse reactions/side effects.   Please note  You were cared for by a hospitalist during your hospital stay. If you have any questions about your discharge medications or the care you received while you were in the hospital after you are discharged, you can call the unit and asked to speak with the hospitalist on call if the hospitalist that took care of you is not available. Once you are discharged, your primary care physician will handle any further medical issues.  Please note that NO REFILLS for any discharge medications will be authorized once you are discharged, as it is imperative that you return to your primary care physician (or establish a relationship with a primary care physician if you do not have one) for your aftercare needs so that they can reassess your need for medications and monitor your lab values. Today   SUBJECTIVE    No new complaints VITAL SIGNS:  Blood pressure (!) 101/45, pulse (!) 109, temperature 98.1 F (36.7 C), temperature source Oral, resp. rate 18, height 5\' 1"  (1.549 m), weight 102.2 kg (225 lb 6.4 oz), SpO2 94 %.  I/O:   Intake/Output Summary (Last 24 hours) at 09/28/16 1107 Last data filed at 09/27/16 2320  Gross per 24 hour  Intake              162 ml  Output              216 ml  Net              -54 ml    PHYSICAL EXAMINATION:  GENERAL:  82 y.o.-year-old patient lying in the bed with no acute distress.  EYES: Pupils equal, round, reactive to light and accommodation. No scleral icterus. Extraocular muscles intact.  HEENT: Head atraumatic, normocephalic. Oropharynx and nasopharynx clear.   NECK:  Supple, no jugular venous distention. No thyroid enlargement, no tenderness.  LUNGS: Normal breath sounds bilaterally, no wheezing, rales,rhonchi or crepitation. No use of accessory muscles of respiration.  CARDIOVASCULAR: S1, S2 normal. No murmurs, rubs, or gallops.  ABDOMEN: Soft, non-tender, non-distended. Bowel sounds present. No organomegaly or mass.  EXTREMITIES: chronic pedal edema,no cyanosis, or clubbing. Right knee immobiliser NEUROLOGIC: Cranial nerves II through XII are intact. Muscle strength 5/5 in all extremities. Sensation intact. Gait not checked.  PSYCHIATRIC: The patient is alert and oriented x 3.  SKIN: No obvious rash, lesion, or ulcer.   DATA REVIEW:   CBC   Recent Labs Lab 09/28/16 0340  WBC 9.5  HGB 11.2*  HCT 34.0*  PLT 110*    Chemistries   Recent Labs Lab 09/27/16 0103  NA 128*  K 3.9  CL 93*  CO2 29  GLUCOSE 191*  BUN 28*  CREATININE 3.77*  CALCIUM 8.7*  AST 22  ALT 11*  ALKPHOS 148*  BILITOT 1.5*    Microbiology Results   No results found for this or any previous visit (from the past 240 hour(s)).  RADIOLOGY:  Dg Knee 1-2 Views Right  Result Date: 09/28/2016 CLINICAL DATA:  Fracture. EXAM: RIGHT KNEE - 1-2 VIEW COMPARISON:  09/09/2016. FINDINGS: Total knee replacement again noted. Hardware intact. Displaced periprosthetic lateral femoral condyle fracture again noted. Displacement may have increased slightly appears to have increased from prior exam. Medial femoral condylar region difficult to image due to patient positioning. Peripheral vascular calcification . IMPRESSION: 1. Displaced periprosthetic lateral femoral condyle fracture again noted. Displacement may have increased slightly from prior exam . 2. Peripheral vascular disease. Electronically Signed   By: Marcello Moores  Register   On: 09/28/2016 07:02   US Venous Img Lower Bilateral  Result Date: 09/26/2016 CLINICAL DATA:  82 year old female with a history of pain and  swelling bilateral legs. EXAM: BILATERAL LOWER EXTREMITY VENOUS DOPPLER ULTRASOUND TECHNIQUE: Gray-scale sonography with graded compression, as well as color Doppler and duplex ultrasound were performed to evaluate the lower extremity deep venous systems from the level of the common femoral vein and including the common femoral, femoral, profunda femoral, popliteal and  calf veins including the posterior tibial, peroneal and gastrocnemius veins when visible. The superficial great saphenous vein was also interrogated. Spectral Doppler was utilized to evaluate flow at rest and with distal augmentation maneuvers in the common femoral, femoral and popliteal veins. COMPARISON:  09/11/2016 FINDINGS: RIGHT LOWER EXTREMITY Common Femoral Vein: No evidence of thrombus. Normal compressibility, respiratory phasicity and response to augmentation. Saphenofemoral Junction: No evidence of thrombus. Normal compressibility and flow on color Doppler imaging. Profunda Femoral Vein: No evidence of thrombus. Normal compressibility and flow on color Doppler imaging. Femoral Vein: Nonocclusive thrombus of the proximal right femoral vein, new from the prior. Popliteal Vein: No evidence of thrombus. Normal compressibility, respiratory phasicity and response to augmentation. Calf Veins: No evidence of thrombus. Normal compressibility and flow on color Doppler imaging. Superficial Great Saphenous Vein: No evidence of thrombus. Normal compressibility and flow on color Doppler imaging. Other Findings:  Edema of the right lower extremity. LEFT LOWER EXTREMITY Common Femoral Vein: No evidence of thrombus. Normal compressibility, respiratory phasicity and response to augmentation. Saphenofemoral Junction: No evidence of thrombus. Normal compressibility and flow on color Doppler imaging. Profunda Femoral Vein: No evidence of thrombus. Normal compressibility and flow on color Doppler imaging. Femoral Vein: No evidence of thrombus. Normal  compressibility, respiratory phasicity and response to augmentation. Popliteal Vein: No evidence of thrombus. Normal compressibility, respiratory phasicity and response to augmentation. Calf Veins: No evidence of thrombus. Normal compressibility and flow on color Doppler imaging. Superficial Great Saphenous Vein: No evidence of thrombus. Normal compressibility and flow on color Doppler imaging. Other Findings: Heterogeneously echoic cystic structure in the left popliteal fossa measuring as large as 6 cm. Lower extremity edema. IMPRESSION: Right: Survey is positive for nonocclusive DVT involving proximal right femoral vein, new from the comparison duplex survey. Edema Left: Survey of the left lower extremity negative for DVT. Likely Baker cyst in the left popliteal fossa. Edema Technologist performing the exam will contact the ordering physician in the emergency department. Signed, Dulcy Fanny. Earleen Newport, DO [facility] Radiology Electronically Signed   By: Corrie Mckusick D.O.   On: 09/26/2016 14:03   Dg Chest Portable 1 View  Result Date: 09/26/2016 CLINICAL DATA:  Left leg pain and swelling today. EXAM: PORTABLE CHEST 1 VIEW COMPARISON:  09/19/2016 and prior exam FINDINGS: Cardiomegaly, right hemidiaphragm elevation and right basilar scarring again noted. There is no evidence of focal airspace disease, pulmonary edema, suspicious pulmonary nodule/mass, pleural effusion, or pneumothorax. No acute bony abnormalities are identified. IMPRESSION: Cardiomegaly without evidence of acute cardiopulmonary disease. Electronically Signed   By: Margarette Canada M.D.   On: 09/26/2016 12:11     Management plans discussed with the patient, family and they are in agreement.  CODE STATUS:     Code Status Orders        Start     Ordered   09/26/16 1928  Do not attempt resuscitation (DNR)  Continuous    Question Answer Comment  In the event of cardiac or respiratory ARREST Do  not call a "code blue"   In the event of cardiac or respiratory ARREST Do not perform Intubation, CPR, defibrillation or ACLS   In the event of cardiac or respiratory ARREST Use medication by any route, position, wound care, and other measures to relive pain and suffering. May use oxygen, suction and manual treatment of airway obstruction as needed for comfort.      09/26/16 1927    Code Status History    Date Active  Date Inactive Code Status Order ID Comments User Context   09/11/2016 12:12 PM 09/21/2016  3:50 PM DNR [id number]  Laverle Hobby, MD Inpatient   09/10/2016 12:48 AM 09/10/2016  2:44 PM Full Code [id number]  Harvie Bridge, DO Inpatient   04/06/2016  4:59 PM 04/10/2016  7:57 PM Full Code [id number]  Epifanio Lesches, MD ED   11/20/2015  3:03 PM 11/20/2015  6:10 PM Full Code [id number]  Algernon Huxley, MD Inpatient   08/14/2015  5:40 AM 08/21/2015 10:45 PM Full Code [id number]  Juluis Mire, MD Inpatient   08/02/2015  1:44 PM 08/11/2015 10:21 PM Full Code [id number]  Gladstone Lighter, MD Inpatient      TOTAL TIME TAKING CARE OF THIS PATIENT: 40 minutes.    [name] M.D on 09/28/2016 at 11:07 AM  Between 7am to 6pm - Pager - [phone] After 6pm go to [email or web address] - password [email or web address] [place] [facility]  Office  [phone]  CC: Primary care physician; Rusty Aus, MD

## 2016-09-28 NOTE — Progress Notes (Signed)
[facility]  ROUNDING NOTE   Subjective:  Patient had hemodialysis yesterday. Overall feeling a bit better. She is being transitioned to an oral anticoagulant.   Objective:  Vital signs in last 24 hours:  Temp:  [97.3 F (36.3 C)-98.2 F (36.8 C)] 98.1 F (36.7 C) (10/17 0834) Pulse Rate:  [99-109] 108 (10/17 1443) Resp:  [14-23] 18 (10/17 1443) BP: (80-106)/(41-59) 106/45 (10/17 1443) SpO2:  [94 %-100 %] 99 % (10/17 1443) Weight:  [102.2 kg (225 lb 6.4 oz)-103.9 kg (229 lb 0.9 oz)] 102.2 kg (225 lb 6.4 oz) (10/17 0609)  Weight change: 4.109 kg (9 lb 0.9 oz) Filed Weights   09/27/16 0500 09/27/16 1600 09/28/16 0609  Weight: 102.5 kg (226 lb) 103.9 kg (229 lb 0.9 oz) 102.2 kg (225 lb 6.4 oz)    Intake/Output: I/O last 3 completed shifts: In: 1478.8 [P.O.:720; I.V.:658.8; IV Piggyback:100] Out: 216 [Other:216]   Intake/Output this shift:  No intake/output data recorded.  Physical Exam: General: No acute distress   Head: Normocephalic, atraumatic  Eyes: Anicteric  Neck: Supple, trachea midline  Lungs:  Scattered rhonchi, normal effort   Heart: Irregular, no rubs   Abdomen:  Soft, nontender, BS present   Extremities: Right leg in an immobilizer, 1+ peripheral edema  Neurologic: Alert and oriented, Following commands   Skin: No lesions  Access: LUE AVF    Basic Metabolic Panel:  Recent Labs Lab 09/26/16 1148 09/27/16 0103  NA 130* 128*  K 3.6 3.9  CL 91* 93*  CO2 33* 29  GLUCOSE 209* 191*  BUN 24* 28*  CREATININE 3.39* 3.77*  CALCIUM 8.8* 8.7*  PHOS  --  2.5    Liver Function Tests:  Recent Labs Lab 09/26/16 1148 09/27/16 0103  AST 24 22  ALT 12* 11*  ALKPHOS 162* 148*  BILITOT 1.5* 1.5*  PROT 5.8* 5.4*  ALBUMIN 2.8* 2.6*   No results for input(s): LIPASE, AMYLASE in the last 168 hours. No results for input(s): AMMONIA in the last 168 hours.  CBC:  Recent Labs Lab 09/26/16 1148 09/27/16 0103 09/28/16 0340  WBC 8.5 8.2  9.5  NEUTROABS 5.3  --   --   HGB 11.3* 10.6* 11.2*  HCT 34.3* 31.4* 34.0*  MCV 103.5* 102.0* 104.9*  PLT 117* 106* 110*    Cardiac Enzymes:  Recent Labs Lab 09/26/16 1148  TROPONINI 0.07*    BNP: Invalid input(s): POCBNP  CBG:  Recent Labs Lab 09/27/16 1202 09/27/16 1803 09/27/16 2113 09/28/16 0730 09/28/16 1151  GLUCAP 158* 160* 148* 157* 200*    Microbiology: Results for orders placed or performed during the hospital encounter of 09/09/16  MRSA PCR Screening     Status: Abnormal   Collection Time: 09/10/16  1:47 AM  Result Value Ref Range Status   MRSA by PCR POSITIVE (A) NEGATIVE Final    Comment:        The GeneXpert MRSA Assay (FDA approved for NASAL specimens only), is one component of a comprehensive MRSA colonization surveillance program. It is not intended to diagnose MRSA infection nor to guide or monitor treatment for MRSA infections. RESULT CALLED TO, READ BACK BY AND VERIFIED WITH: MONIQUE ROBERSON ON 09/10/16 AT 0359 BY TLB   CULTURE, BLOOD (ROUTINE X 2) w Reflex to ID Panel     Status: None   Collection Time: 09/10/16  8:19 AM  Result Value Ref Range Status   Specimen Description BLOOD RIGHT HAND  Final   Special Requests BOTTLES DRAWN AEROBIC  AND ANAEROBIC .5ML  Final   Culture NO GROWTH 5 DAYS  Final   Report Status 09/15/2016 FINAL  Final  CULTURE, BLOOD (ROUTINE X 2) w Reflex to ID Panel     Status: None   Collection Time: 09/10/16  6:04 PM  Result Value Ref Range Status   Specimen Description BLOOD RIGHT HAND  Final   Special Requests BOTTLES DRAWN AEROBIC AND ANAEROBIC .5ML  Final   Culture NO GROWTH 5 DAYS  Final   Report Status 09/15/2016 FINAL  Final    Coagulation Studies:  Recent Labs  09/26/16 1635 09/28/16 0340  LABPROT 14.4 13.7  INR 1.11 1.05    Urinalysis: No results for input(s): COLORURINE, LABSPEC, PHURINE, GLUCOSEU, HGBUR, BILIRUBINUR, KETONESUR, PROTEINUR, UROBILINOGEN, NITRITE, LEUKOCYTESUR in the last  72 hours.  Invalid input(s): APPERANCEUR    Imaging: Dg Knee 1-2 Views Right  Result Date: 09/28/2016 CLINICAL DATA:  Fracture. EXAM: RIGHT KNEE - 1-2 VIEW COMPARISON:  09/09/2016. FINDINGS: Total knee replacement again noted. Hardware intact. Displaced periprosthetic lateral femoral condyle fracture again noted. Displacement may have increased slightly appears to have increased from prior exam. Medial femoral condylar region difficult to image due to patient positioning. Peripheral vascular calcification . IMPRESSION: 1. Displaced periprosthetic lateral femoral condyle fracture again noted. Displacement may have increased slightly from prior exam . 2. Peripheral vascular disease. Electronically Signed   By: Marcello Moores  Register   On: 09/28/2016 07:02     Medications:     . allopurinol  100 mg Oral QHS  . apixaban  5 mg Oral BID  . calcium acetate  667 mg Oral BID BM  . cholecalciferol  1,000 Units Oral QHS  . docusate sodium  100 mg Oral BID  . epoetin (EPOGEN/PROCRIT) injection  4,000 Units Intravenous Q M,W,F-HD  . fluticasone  2 spray Each Nare QHS  . insulin aspart  0-9 Units Subcutaneous TID WC  . insulin glargine  8 Units Subcutaneous QHS  . midodrine  10 mg Oral BID WC  . multivitamin with minerals  1 tablet Oral QHS  . pantoprazole  40 mg Oral QHS  . polyethylene glycol  17 g Oral QHS  . propranolol  20 mg Oral BID  . QUEtiapine  25 mg Oral QHS  . sevelamer carbonate  1,600 mg Oral TID WC  . sevelamer carbonate  800 mg Oral BID  . sodium chloride flush  3 mL Intravenous Q12H  . traZODone  50 mg Oral QHS   acetaminophen **OR** acetaminophen, alteplase, bisacodyl, heparin, ipratropium-albuterol, lidocaine (PF), lidocaine-prilocaine, nystatin cream, ondansetron **OR** ondansetron (ZOFRAN) IV, pentafluoroprop-tetrafluoroeth, senna, traMADol  Assessment/ Plan:  82 y.o. white female with end-stage renal disease on hemodialysis, diabetes mellitus type 2, hypertension, obstructive  sleep apnea, atrial fibrillation, arthritis, anemia, distal right femoral fracture, admitted on 09/26/2016 for Leg edema [R60.0] Venous thrombosis [I82.90] , DVT  MWF [place]  1.  End-stage renal disease-  Pt completed HD yesterday, no acute indication for dialysis today, we will plan for HD again tomorrow as outpt.  2.  Hypotension:  - continue midodrine 10mg  po bid.    3.  Anemia of chronic kidney disease - continue epogen as an outpt.   4.  Secondary hyperparathyroidism: Phos 2.5 yesterday, continue renvela.    5. Right LE DVT:  IVC filter placed, pt to be started on eliquis.     LOS: 2 Meilyn Heindl 10/17/20173:36 PM

## 2016-09-28 NOTE — Progress Notes (Signed)
Chaplain making his rounds visited this Pt, whom he had seen awhile ago. This was a follow-up visit to see how the Pt was doing. Pt was sleeping at the time of this visit, but Ch spoke to her husband and then offered prayers for patient, and spiritual support for husband who appeared to be overwhelmed physically. Chaplains promised to visit pt again soon.   09/28/16 1300  Clinical Encounter Type  Visited With Patient and family together  Visit Type Follow-up;Spiritual support  Spiritual Encounters  Spiritual Needs Prayer

## 2016-09-28 NOTE — Progress Notes (Signed)
LCSW spoke with RN and MD. Patient stable to discharge back to SNF today. Patient will return to [facility] and LCSW confirmed with admission coordinator.  Family at bedside and will be made aware. Patient will require EMS and LCSW will arrange for transport.  No other needs at this time.  Lane Hacker, MSW Clinical Social Work: Hydrologist for :  [name]:  [phone]

## 2016-09-28 NOTE — Consult Note (Signed)
ANTICOAGULATION CONSULT NOTE - Follow up [place] for heparin drip Indication: DVT  Allergies  Allergen Reactions  . Angiotensin Receptor Blockers Other (See Comments)    Reaction:  Hyperkalemia  . Penicillins Rash and Other (See Comments)    rash    Patient Measurements: Height: 5\' 1"  (154.9 cm) Weight: 229 lb 0.9 oz (103.9 kg) IBW/kg (Calculated) : 47.8 Heparin Dosing Weight: 71.8kg  Vital Signs: Temp: 98 F (36.7 C) (10/17 0527) Temp Source: Oral (10/17 0527) BP: 99/48 (10/17 0527) Pulse Rate: 109 (10/17 0527)  Labs:  Recent Labs  09/26/16 1148 09/26/16 1635 09/27/16 0103 09/27/16 0907 09/28/16 0340  HGB 11.3*  --  10.6*  --  11.2*  HCT 34.3*  --  31.4*  --  34.0*  PLT 117*  --  106*  --  110*  APTT  --  37*  --   --   --   LABPROT  --  14.4  --   --  13.7  INR  --  1.11  --   --  1.05  HEPARINUNFRC  --   --  0.33 0.36 0.25*  CREATININE 3.39*  --  3.77*  --   --   TROPONINI 0.07*  --   --   --   --     Estimated Creatinine Clearance: 13.2 mL/min (by C-G formula based on SCr of 3.77 mg/dL (H)).   Medical History: Past Medical History:  Diagnosis Date  . A-fib (HCC)    not on anticoagulation  . Anemia   . Bilateral lower extremity edema   . CHF (congestive heart failure) ([place])   . Diabetes mellitus without complication [facility])    Patient takes Insulin  . Dysrhythmia   . ESRD (end stage renal disease) ([place])    Monday, wednesday, Friday DIalysis  . Essential tremor   . HTN (hypertension)   . OSA (obstructive sleep apnea)   . Osteoarthritis   . Renal insufficiency    Patient is on dialysis and normal days are M,W and F.  . Skin cancer    Resected from legs    Assessment: Pt is a 82 year old female s/p right knee fracture who presents with a right lower extremity DVT. Med rec shows no home anticoagulation. Baseline labs ordered.  Goal of Therapy:  Heparin level 0.3-0.7 units/ml Monitor platelets by anticoagulation protocol:  Yes   Plan:  Give 3600 units bolus x 1 Start heparin infusion at 1050 units/hr Check anti-Xa level in 8 hours and daily while on heparin Continue to monitor H&H and platelets  10/16 0103 HL therapeutic x 1. Continue current rate. Will recheck HL in 8 hours.  10/16 0907 HL of 0.36, repeat level therapeutic. RN confirms heparin running at 1050 units/hr and no s/sx of bleeding. Delay in lab reporting result this AM due to one of their instruments being down. Will continue heparin drip at current rate. Next heparin level in AM and CBC in AM.   10/17 AM heparin level 0.25. 1100 unit bolus and increase rate to 1200 units/hr. Recheck in 8 hours.   Pharmacy will continue to follow.   Rayna Sexton, PharmD, BCPS Clinical Pharmacist 09/28/2016 5:34 AM

## 2016-09-28 NOTE — Progress Notes (Signed)
ANTICOAGULATION CONSULT NOTE -Follow up [place] for warfarin dosing Indication: DVT  Allergies  Allergen Reactions  . Angiotensin Receptor Blockers Other (See Comments)    Reaction:  Hyperkalemia  . Penicillins Rash and Other (See Comments)    rash    Patient Measurements: Height: 5\' 1"  (154.9 cm) Weight: 225 lb 6.4 oz (102.2 kg) IBW/kg (Calculated) : 47.8 Heparin Dosing Weight: 71.8  Vital Signs: Temp: 98 F (36.7 C) (10/17 0527) Temp Source: Oral (10/17 0527) BP: 99/48 (10/17 0527) Pulse Rate: 109 (10/17 0527)  Labs:  Recent Labs  09/26/16 1148 09/26/16 1635 09/27/16 0103 09/27/16 0907 09/28/16 0340  HGB 11.3*  --  10.6*  --  11.2*  HCT 34.3*  --  31.4*  --  34.0*  PLT 117*  --  106*  --  110*  APTT  --  37*  --   --   --   LABPROT  --  14.4  --   --  13.7  INR  --  1.11  --   --  1.05  HEPARINUNFRC  --   --  0.33 0.36 0.25*  CREATININE 3.39*  --  3.77*  --   --   TROPONINI 0.07*  --   --   --   --     Estimated Creatinine Clearance: 13.1 mL/min (by C-G formula based on SCr of 3.77 mg/dL (H)).   Medical History: Past Medical History:  Diagnosis Date  . A-fib (HCC)    not on anticoagulation  . Anemia   . Bilateral lower extremity edema   . CHF (congestive heart failure) ([place])   . Diabetes mellitus without complication [facility])    Patient takes Insulin  . Dysrhythmia   . ESRD (end stage renal disease) ([place])    Monday, wednesday, Friday DIalysis  . Essential tremor   . HTN (hypertension)   . OSA (obstructive sleep apnea)   . Osteoarthritis   . Renal insufficiency    Patient is on dialysis and normal days are M,W and F.  . Skin cancer    Resected from legs    Medications:  Scheduled:  . allopurinol  100 mg Oral QHS  . calcium acetate  667 mg Oral BID BM  . cholecalciferol  1,000 Units Oral QHS  . docusate sodium  100 mg Oral BID  . epoetin (EPOGEN/PROCRIT) injection  4,000 Units Intravenous Q M,W,F-HD  . fluticasone  2 spray  Each Nare QHS  . insulin aspart  0-9 Units Subcutaneous TID WC  . insulin glargine  8 Units Subcutaneous QHS  . midodrine  10 mg Oral BID WC  . multivitamin with minerals  1 tablet Oral QHS  . pantoprazole  40 mg Oral QHS  . polyethylene glycol  17 g Oral QHS  . propranolol  20 mg Oral BID  . QUEtiapine  25 mg Oral QHS  . sevelamer carbonate  1,600 mg Oral TID WC  . sevelamer carbonate  800 mg Oral BID  . sodium chloride flush  3 mL Intravenous Q12H  . traZODone  50 mg Oral QHS   Infusions:  . heparin 1,200 Units/hr (09/28/16 0544)    Assessment: Pharmacy consulted to dose warfarin for DVT in this  41yoF with PMH of ESRD on HD, s/p right knee surgery and chronic Afib not on anticoag PTA. She is currently on heparin drip 1050 U/hr and per MD no obvious bleeding problems. HL 1016 @ 0103 = 0.33  Goal of Therapy:  INR  2-3 Monitor platelets by anticoagulation protocol: Yes   Plan:  Will begin bridging patient to warfarin therapy tonight. Will begin 4 mg daily due to age and borderline baseline albumin (2.6). Possible interaction with allopurinol. Will continue to monitor CBC and PT/INR.   Date  Dose  INR 10/15  none  1.1  10/16  none  none 10/17    1.05    10/17:  Vascular placed IVC filter yesterday 10/16. Patient on Heparin Drip. F/u to determine if Warfarin to be continued. No order currently.   Chinita Greenland PharmD Clinical Pharmacist 09/28/2016

## 2016-09-28 NOTE — Progress Notes (Signed)
Dr. Posey Pronto has been in to round on pt, explained to pt and husband that pt can be started on eliquis po today and pt can return to [facility]. Pt received zofran for c/o nausea.

## 2016-09-28 NOTE — Consult Note (Signed)
ORTHOPAEDIC CONSULTATION  REQUESTING PHYSICIAN: Fritzi Mandes, MD  Chief Complaint: Right knee injury  HPI: [name] is a 82 y.o. female who complains of  right knee injury following a fall on 09/09/2016.  Patient was admitted to [facility] at that time for multiple medical problems and was in the ICU for an extended period of time.  She did not undergo any surgery at that time.  Initial x-rays showed a minimally displaced supracondylar fracture of the right distal femur with a total knee replacement.  She was treated with a knee immobilizer.  She did have surgery by myself on the opposite leg last year for a similar injury.  The patient was discharged recently, but then readmitted for DVT.  A vena cava filter was inserted yesterday.  X-rays today show minimal change in the appearance of the fracture fragments.  X-rays are not optimal, but there does not appear to be any severe displacement or angulation.  Patient remains fairly comfortable.  Due to her multiple medical issues she is not deemed to be a surgical candidate.  Past Medical History:  Diagnosis Date  . A-fib (HCC)    not on anticoagulation  . Anemia   . Bilateral lower extremity edema   . CHF (congestive heart failure) ([place])   . Diabetes mellitus without complication [facility])    Patient takes Insulin  . Dysrhythmia   . ESRD (end stage renal disease) ([place])    Monday, wednesday, Friday DIalysis  . Essential tremor   . HTN (hypertension)   . OSA (obstructive sleep apnea)   . Osteoarthritis   . Renal insufficiency    Patient is on dialysis and normal days are M,W and F.  . Skin cancer    Resected from legs   Past Surgical History:  Procedure Laterality Date  . ABDOMINAL HYSTERECTOMY    . ANKLE FRACTURE SURGERY     right ankle  . CHOLECYSTECTOMY    . ESOPHAGOGASTRODUODENOSCOPY (EGD) WITH PROPOFOL N/A 10/09/2015   Procedure: ESOPHAGOGASTRODUODENOSCOPY (EGD) WITH PROPOFOL;  Surgeon: Hulen Luster, MD;   Location: [facility] ENDOSCOPY;  Service: Gastroenterology;  Laterality: N/A;  . FEMUR FRACTURE SURGERY     left femur  . KNEE ARTHROPLASTY    . NEUROPLASTY / TRANSPOSITION MEDIAN NERVE AT CARPAL TUNNEL BILATERAL    . PERIPHERAL VASCULAR CATHETERIZATION Left 08/06/2015   Procedure: A/V Shuntogram/Fistulagram;  Surgeon: Algernon Huxley, MD;  Location: [place] CV LAB;  Service: Cardiovascular;  Laterality: Left;  . PERIPHERAL VASCULAR CATHETERIZATION Left 11/20/2015   Procedure: A/V Shuntogram/Fistulagram;  Surgeon: Algernon Huxley, MD;  Location: [place] CV LAB;  Service: Cardiovascular;  Laterality: Left;  . PERIPHERAL VASCULAR CATHETERIZATION N/A 11/20/2015   Procedure: A/V Shunt Intervention;  Surgeon: Algernon Huxley, MD;  [place] [facility];  Service: Cardiovascular;  Laterality: N/A;  . PERIPHERAL VASCULAR CATHETERIZATION N/A 09/27/2016   Procedure: IVC Filter Insertion;  Surgeon: Algernon Huxley, MD;  Location: [place] CV LAB;  Service: Cardiovascular;  Laterality: N/A;  . TUBAL LIGATION     Social History   Social History  . Marital status: Married    Spouse name: N/A  . Number of children: N/A  . Years of education: N/A   Social History Main Topics  . Smoking status: Never Smoker  . Smokeless tobacco: Never Used  . Alcohol use No  . Drug use: No  . Sexual activity: Not Asked   Other Topics Concern  . None  Social History Narrative  . None   Family History  Problem Relation Age of Onset  . Diabetes type II Father   . Breast cancer Sister 43  . Breast cancer Maternal Grandmother    Allergies  Allergen Reactions  . Angiotensin Receptor Blockers Other (See Comments)    Reaction:  Hyperkalemia  . Penicillins Rash and Other (See Comments)    rash   Prior to Admission medications   Medication Sig Start Date End Date Taking? Authorizing Provider  allopurinol (ZYLOPRIM) 100 MG tablet Take 100 mg by mouth daily.    Yes Historical Provider, MD  calcium acetate  (PHOSLO) 667 MG capsule Take 1 capsule (667 mg total) by mouth 2 (two) times daily between meals. 04/10/16  Yes Vaughan Basta, MD  cholecalciferol (VITAMIN D) 1000 units tablet Take 1,000 Units by mouth daily.   Yes Historical Provider, MD  docusate sodium (COLACE) 100 MG capsule Take 1 capsule (100 mg total) by mouth 2 (two) times daily. 04/10/16  Yes Vaughan Basta, MD  fluticasone (FLONASE) 50 MCG/ACT nasal spray Place 2 sprays into both nostrils at bedtime.   Yes Historical Provider, MD  guaiFENesin (MUCINEX) 600 MG 12 hr tablet Take 1 tablet (600 mg total) by mouth 2 (two) times daily. 09/21/16  Yes Gladstone Lighter, MD  insulin glargine (LANTUS) 100 UNIT/ML injection Inject 0.08 mLs (8 Units total) into the skin at bedtime. 09/21/16  Yes Gladstone Lighter, MD  ipratropium-albuterol (DUONEB) 0.5-2.5 (3) MG/3ML SOLN Take 3 mLs by nebulization 4 (four) times daily as needed (for wheezing/shortness of breath).   Yes Historical Provider, MD  lidocaine-prilocaine (EMLA) cream Apply 1 application topically as needed. Apply to access site (left forearm) 1 hour prior to dialysis and cover with clear wrap. Mon, wed, fri 0500   Yes Historical Provider, MD  midodrine (PROAMATINE) 10 MG tablet Take 1 tablet (10 mg total) by mouth 3 (three) times daily with meals. Patient taking differently: Take 10 mg by mouth 3 (three) times daily with meals. [place] Reek, thu, sat - 0900,1800,2100 09/21/16  Yes Gladstone Lighter, MD  Multiple Vitamin (MULTIVITAMIN WITH MINERALS) TABS tablet Take 1 tablet by mouth daily.   Yes Historical Provider, MD  nystatin cream (MYCOSTATIN) Apply 1 application topically 2 (two) times daily as needed (for itching).    Yes Historical Provider, MD  pantoprazole (PROTONIX) 40 MG tablet Take 40 mg by mouth daily.   Yes Historical Provider, MD  polyethylene glycol (MIRALAX / GLYCOLAX) packet Take 17 g by mouth daily. In 4-8oz fluid of choice   Yes Historical Provider, MD   promethazine (PHENERGAN) 25 MG tablet Take 25 mg by mouth every 6 (six) hours as needed for nausea or vomiting.    Yes Historical Provider, MD  propranolol (INDERAL) 20 MG tablet Take 1 tablet (20 mg total) by mouth 2 (two) times daily. 09/21/16  Yes Gladstone Lighter, MD  QUEtiapine (SEROQUEL) 25 MG tablet Take 1 tablet (25 mg total) by mouth at bedtime. 09/21/16  Yes Gladstone Lighter, MD  RENVELA 800 MG tablet Take 800-1,600 mg by mouth 5 (five) times daily. Pt takes two tablets three times daily with meals and one tablet two times daily with snacks. 06/30/15  Yes Historical Provider, MD  senna (SENOKOT) 8.6 MG TABS tablet Take 2 tablets (17.2 mg total) by mouth daily as needed for mild constipation. 04/10/16  Yes Vaughan Basta, MD  traMADol (ULTRAM) 50 MG tablet Take 1 tablet (50 mg total) by mouth every 6 (six) hours as  needed for moderate pain or severe pain. 09/21/16  Yes Gladstone Lighter, MD  apixaban (ELIQUIS) 5 MG TABS tablet Take 1 tablet (5 mg total) by mouth 2 (two) times daily. 09/28/16 10/26/16  Fritzi Mandes, MD   Dg Knee 1-2 Views Right  Result Date: 09/28/2016 CLINICAL DATA:  Fracture. EXAM: RIGHT KNEE - 1-2 VIEW COMPARISON:  09/09/2016. FINDINGS: Total knee replacement again noted. Hardware intact. Displaced periprosthetic lateral femoral condyle fracture again noted. Displacement may have increased slightly appears to have increased from prior exam. Medial femoral condylar region difficult to image due to patient positioning. Peripheral vascular calcification . IMPRESSION: 1. Displaced periprosthetic lateral femoral condyle fracture again noted. Displacement may have increased slightly from prior exam . 2. Peripheral vascular disease. Electronically Signed   By: Marcello Moores  Register   On: 09/28/2016 07:02   US Venous Img Lower Bilateral  Result Date: 09/26/2016 CLINICAL DATA:  82 year old female with a history of pain and swelling bilateral legs. EXAM: BILATERAL LOWER EXTREMITY  VENOUS DOPPLER ULTRASOUND TECHNIQUE: Gray-scale sonography with graded compression, as well as color Doppler and duplex ultrasound were performed to evaluate the lower extremity deep venous systems from the level of the common femoral vein and including the common femoral, femoral, profunda femoral, popliteal and calf veins including the posterior tibial, peroneal and gastrocnemius veins when visible. The superficial great saphenous vein was also interrogated. Spectral Doppler was utilized to evaluate flow at rest and with distal augmentation maneuvers in the common femoral, femoral and popliteal veins. COMPARISON:  09/11/2016 FINDINGS: RIGHT LOWER EXTREMITY Common Femoral Vein: No evidence of thrombus. Normal compressibility, respiratory phasicity and response to augmentation. Saphenofemoral Junction: No evidence of thrombus. Normal compressibility and flow on color Doppler imaging. Profunda Femoral Vein: No evidence of thrombus. Normal compressibility and flow on color Doppler imaging. Femoral Vein: Nonocclusive thrombus of the proximal right femoral vein, new from the prior. Popliteal Vein: No evidence of thrombus. Normal compressibility, respiratory phasicity and response to augmentation. Calf Veins: No evidence of thrombus. Normal compressibility and flow on color Doppler imaging. Superficial Great Saphenous Vein: No evidence of thrombus. Normal compressibility and flow on color Doppler imaging. Other Findings:  Edema of the right lower extremity. LEFT LOWER EXTREMITY Common Femoral Vein: No evidence of thrombus. Normal compressibility, respiratory phasicity and response to augmentation. Saphenofemoral Junction: No evidence of thrombus. Normal compressibility and flow on color Doppler imaging. Profunda Femoral Vein: No evidence of thrombus. Normal compressibility and flow on color Doppler imaging. Femoral Vein: No evidence of thrombus. Normal compressibility, respiratory phasicity and response to augmentation.  Popliteal Vein: No evidence of thrombus. Normal compressibility, respiratory phasicity and response to augmentation. Calf Veins: No evidence of thrombus. Normal compressibility and flow on color Doppler imaging. Superficial Great Saphenous Vein: No evidence of thrombus. Normal compressibility and flow on color Doppler imaging. Other Findings: Heterogeneously echoic cystic structure in the left popliteal fossa measuring as large as 6 cm. Lower extremity edema. IMPRESSION: Right: Survey is positive for nonocclusive DVT involving proximal right femoral vein, new from the comparison duplex survey. Edema Left: Survey of the left lower extremity negative for DVT. Likely Baker cyst in the left popliteal fossa. Edema Technologist performing the exam will contact the ordering physician in the emergency department. Signed, Dulcy Fanny. Earleen Newport, DO [facility] Radiology Electronically Signed   By: Corrie Mckusick D.O.   On: 09/26/2016 14:03    Positive ROS: All other systems have been reviewed and were otherwise negative with the exception of those  mentioned in the HPI and as above.  Physical Exam: General: Alert, no acute distress Cardiovascular: No pedal edema Respiratory: No cyanosis, no use of accessory musculature GI: No organomegaly, abdomen is soft and non-tender Skin: No lesions in the area of chief complaint Neurologic: Sensation intact distally Psychiatric: Patient is competent for consent with normal mood and affect Lymphatic: No axillary or cervical lymphadenopathy  MUSCULOSKELETAL: Both legs with edema.  Mild pain, right leg.  Neurovascular status intact.  Alert and oriented.  Brace is not applied appropriately.  Assessment: Supracondylar fracture right distal femur with total knee replacement.  Minimal angulation.  Plan: Continue immobilization with the immobilizer.  Compression of the lower legs with Ace bandages or TED hose. I do not believe she is  an operative candidate She will need to be bed to chair for an extended period of time.   Park Breed, MD [phone]   09/28/2016 12:11 PM

## 2016-09-28 NOTE — Progress Notes (Signed)
This Probation officer gave report to Ms. Ishmael Holter, Therapist, sports at [employer]. Pt left her via EMS transport at approx 1440. This writer applied ace wraps to pt's legs bilat, and R knee immobilizer was repositioned per Dr. Ammie Ferrier request. Pt tolerated but c/o pain whenever her legs were touched. Pt able to accomplish very little movement on her own.

## 2016-09-28 NOTE — Progress Notes (Signed)
[facility]  Daily Progress Note   Subjective: 1 Day Post-Op: Ultrasound guidance for vascular access to the right femoral vein, Catheter placement into the inferior vena cava, Inferior venacavogram with Placement of a Cook Celect IVC filter.  Patient resting comfortably in bed being fed lunch by her husband. Patient without complaint.   Objective: Vitals:   09/27/16 1955 09/28/16 0527 09/28/16 0609 09/28/16 0834  BP: (!) 90/50 (!) 99/48  (!) 101/45  Pulse: 99 (!) 109  (!) 109  Resp:  19  18  Temp:  98 F (36.7 C)  98.1 F (36.7 C)  TempSrc:  Oral  Oral  SpO2:  100%  94%  Weight:   225 lb 6.4 oz (102.2 kg)   Height:   5\' 1"  (1.549 m)     Intake/Output Summary (Last 24 hours) at 09/28/16 1317 Last data filed at 09/27/16 2320  Gross per 24 hour  Intake              120 ml  Output              216 ml  Net              -96 ml   Physical Exam: Slightly confused but seems to be baseline, NAD CV: RRR Pulmonary: CTA Bilaterally Abdomen: Soft, Nontender, Nondistended Right Groin: Dressing in place - clean and dry. No swelling or drainage noted.  Vascular: Bilateral moderate edema. Warm. Non-tender.   Laboratory: CBC    Component Value Date/Time   WBC 9.5 09/28/2016 0340   HGB 11.2 (L) 09/28/2016 0340   HGB 8.6 (L) 03/10/2015 0811   HCT 34.0 (L) 09/28/2016 0340   HCT 28.7 (L) 08/14/2015 1519   PLT 110 (L) 09/28/2016 0340   PLT 183 03/10/2015 0426   BMET    Component Value Date/Time   NA 128 (L) 09/27/2016 0103   NA 130 (L) 03/10/2015 0426   K 3.9 09/27/2016 0103   K 5.3 (H) 03/10/2015 0811   CL 93 (L) 09/27/2016 0103   CL 91 (L) 03/10/2015 0426   CO2 29 09/27/2016 0103   CO2 26 03/10/2015 0426   GLUCOSE 191 (H) 09/27/2016 0103   GLUCOSE 85 03/10/2015 0426   BUN 28 (H) 09/27/2016 0103   BUN 41 (H) 03/10/2015 0426   CREATININE 3.77 (H) 09/27/2016 0103   CREATININE 6.65 (H) 03/10/2015 0426   CALCIUM 8.7 (L) 09/27/2016 0103   CALCIUM 7.6  (L) 03/10/2015 0426   GFRNONAA 10 (L) 09/27/2016 0103   GFRNONAA 5 (L) 03/10/2015 0426   GFRAA 12 (L) 09/27/2016 0103   GFRAA 6 (L) 03/10/2015 0426   Assessment/Planning: 82 year old female s/p POD #1: Ultrasound guidance for vascular access to the right femoral vein, Catheter placement into the inferior vena cava, Inferior venacavogram with Placement of a Cook Celect IVC filter for who presents with DVT and felt to be a poor candidate for long term anticoagulation - stable 1) Access site healing well.  2) Patient to follow up in 6-8 weeks after discharge in our office with RLE Venous Duplex for DVT 3) Will sign off for now. Please reconsult if needed.  Marcelle Overlie PA-C 09/28/2016 1:17 PM

## 2016-10-12 ENCOUNTER — Ambulatory Visit (INDEPENDENT_AMBULATORY_CARE_PROVIDER_SITE_OTHER): Payer: Medicare Other | Admitting: Vascular Surgery

## 2016-10-12 ENCOUNTER — Encounter (INDEPENDENT_AMBULATORY_CARE_PROVIDER_SITE_OTHER): Payer: Self-pay | Admitting: Vascular Surgery

## 2016-10-12 VITALS — BP 99/55 | HR 94 | Resp 17 | Ht 64.0 in | Wt 218.0 lb

## 2016-10-12 DIAGNOSIS — I82411 Acute embolism and thrombosis of right femoral vein: Secondary | ICD-10-CM | POA: Diagnosis not present

## 2016-10-12 DIAGNOSIS — M7989 Other specified soft tissue disorders: Secondary | ICD-10-CM

## 2016-10-12 DIAGNOSIS — Z992 Dependence on renal dialysis: Secondary | ICD-10-CM

## 2016-10-12 DIAGNOSIS — Z794 Long term (current) use of insulin: Secondary | ICD-10-CM

## 2016-10-12 DIAGNOSIS — N186 End stage renal disease: Secondary | ICD-10-CM | POA: Diagnosis not present

## 2016-10-12 DIAGNOSIS — E119 Type 2 diabetes mellitus without complications: Secondary | ICD-10-CM

## 2016-10-12 DIAGNOSIS — S72424S Nondisplaced fracture of lateral condyle of right femur, sequela: Secondary | ICD-10-CM

## 2016-10-12 NOTE — Assessment & Plan Note (Signed)
Certainly worsens or lower extremity swelling and her immobility making the swelling worse. Going to see the orthopedic surgeon today and then should return for an Haematologist.

## 2016-10-12 NOTE — Progress Notes (Signed)
History of Present Illness  There is no documented history at this time  Assessments & Plan   There are no diagnoses linked to this encounter.    Additional instructions  Subjective:  Patient presents with venous ulcer of the Bilateral lower extremity.    Procedure:  3 layer unna wrap was placed Bilateral lower extremity.   Plan:   Follow up in one week.  

## 2016-10-12 NOTE — Assessment & Plan Note (Signed)
Certainly contribute some lower extremity swelling. Has been getting dialysis almost every day.

## 2016-10-12 NOTE — Assessment & Plan Note (Signed)
The patient has what is likely subacute DVT in the right lower extremity. In some regards, this is likely associated with her fracture. She is on anticoagulation. She had a filter placed. At this point, her swelling is so severe S do think we need to wrap her legs with Unna boots. She has a filter in place for protection and anticoagulation. We will wrap these weekly, and recheck her in several weeks.

## 2016-10-12 NOTE — Assessment & Plan Note (Signed)
blood glucose control important in reducing the progression of atherosclerotic disease. Also, involved in wound healing. On appropriate medications.  

## 2016-10-12 NOTE — Progress Notes (Signed)
MRN : [id number]  [name] is a 82 y.o. (January 24, 1934) female who presents with chief complaint of  Chief Complaint  Patient presents with  . Re-evaluation    Lymphadema  .  History of Present Illness: Patient returns in follow up for Worsening leg swelling. She had weeping of the skin. Her right leg is more swollen than the left, and this is the leg with the subacute DVT. She is very immobile. She looks much more debilitated now that she has on previous visits. She does not have fever or chills..    Past Medical History:  Diagnosis Date  . A-fib (HCC)    not on anticoagulation  . Anemia   . Bilateral lower extremity edema   . CHF (congestive heart failure) ([place])   . Diabetes mellitus without complication [facility])    Patient takes Insulin  . Dysrhythmia   . ESRD (end stage renal disease) ([place])    Monday, wednesday, Friday DIalysis  . Essential tremor   . HTN (hypertension)   . OSA (obstructive sleep apnea)   . Osteoarthritis   . Renal insufficiency    Patient is on dialysis and normal days are M,W and F.  . Skin cancer    Resected from legs    Past Surgical History:  Procedure Laterality Date  . ABDOMINAL HYSTERECTOMY    . ANKLE FRACTURE SURGERY     right ankle  . CHOLECYSTECTOMY    . ESOPHAGOGASTRODUODENOSCOPY (EGD) WITH PROPOFOL N/A 10/09/2015   Procedure: ESOPHAGOGASTRODUODENOSCOPY (EGD) WITH PROPOFOL;  Surgeon: Hulen Luster, MD;  Location: [facility] ENDOSCOPY;  Service: Gastroenterology;  Laterality: N/A;  . FEMUR FRACTURE SURGERY     left femur  . KNEE ARTHROPLASTY    . NEUROPLASTY / TRANSPOSITION MEDIAN NERVE AT CARPAL TUNNEL BILATERAL    . PERIPHERAL VASCULAR CATHETERIZATION Left 08/06/2015   Procedure: A/V Shuntogram/Fistulagram;  Surgeon: Algernon Huxley, MD;  Location: [place] CV LAB;  Service: Cardiovascular;  Laterality: Left;  . PERIPHERAL VASCULAR CATHETERIZATION Left 11/20/2015   Procedure: A/V Shuntogram/Fistulagram;  Surgeon: Algernon Huxley, MD;   Location: [place] CV LAB;  Service: Cardiovascular;  Laterality: Left;  . PERIPHERAL VASCULAR CATHETERIZATION N/A 11/20/2015   Procedure: A/V Shunt Intervention;  Surgeon: Algernon Huxley, MD;  Location: [place] CV LAB;  Service: Cardiovascular;  Laterality: N/A;  . PERIPHERAL VASCULAR CATHETERIZATION N/A 09/27/2016   Procedure: IVC Filter Insertion;  Surgeon: Algernon Huxley, MD;  Location: [place] CV LAB;  Service: Cardiovascular;  Laterality: N/A;  . TUBAL LIGATION      Social History Social History  Substance Use Topics  . Smoking status: Never Smoker  . Smokeless tobacco: Never Used  . Alcohol use No     Family History Family History  Problem Relation Age of Onset  . Diabetes type II Father   . Breast cancer Sister 19  . Breast cancer Maternal Grandmother      Current Outpatient Prescriptions  Medication Sig Dispense Refill  . allopurinol (ZYLOPRIM) 100 MG tablet Take 100 mg by mouth daily.     Marland Kitchen apixaban (ELIQUIS) 5 MG TABS tablet Take 1 tablet (5 mg total) by mouth 2 (two) times daily. 60 tablet 1  . calcium acetate (PHOSLO) 667 MG capsule Take 1 capsule (667 mg total) by mouth 2 (two) times daily between meals. 60 capsule 0  . cholecalciferol (VITAMIN D) 1000 units tablet Take 1,000 Units by mouth daily.    Marland Kitchen docusate sodium (COLACE) 100 MG  capsule Take 1 capsule (100 mg total) by mouth 2 (two) times daily. 10 capsule 0  . fluticasone (FLONASE) 50 MCG/ACT nasal spray Place 2 sprays into both nostrils at bedtime.    Marland Kitchen guaiFENesin (MUCINEX) 600 MG 12 hr tablet Take 1 tablet (600 mg total) by mouth 2 (two) times daily. 20 tablet 0  . insulin glargine (LANTUS) 100 UNIT/ML injection Inject 0.08 mLs (8 Units total) into the skin at bedtime. 10 mL 11  . ipratropium-albuterol (DUONEB) 0.5-2.5 (3) MG/3ML SOLN Take 3 mLs by nebulization 4 (four) times daily as needed (for wheezing/shortness of breath).    . lidocaine-prilocaine (EMLA) cream Apply 1 application topically as  needed. Apply to access site (left forearm) 1 hour prior to dialysis and cover with clear wrap. Mon, wed, fri 0500    . midodrine (PROAMATINE) 10 MG tablet Take 1 tablet (10 mg total) by mouth 3 (three) times daily with meals. (Patient taking differently: Take 10 mg by mouth 3 (three) times daily with meals. Sun, tue, thu, sat - 0900,1800,2100) 90 tablet 2  . Multiple Vitamin (MULTIVITAMIN WITH MINERALS) TABS tablet Take 1 tablet by mouth daily.    Marland Kitchen nystatin cream (MYCOSTATIN) Apply 1 application topically 2 (two) times daily as needed (for itching).     . pantoprazole (PROTONIX) 40 MG tablet Take 40 mg by mouth daily.    . polyethylene glycol (MIRALAX / GLYCOLAX) packet Take 17 g by mouth daily. In 4-8oz fluid of choice    . promethazine (PHENERGAN) 25 MG tablet Take 25 mg by mouth every 6 (six) hours as needed for nausea or vomiting.     . propranolol (INDERAL) 20 MG tablet Take 1 tablet (20 mg total) by mouth 2 (two) times daily. 60 tablet 2  . QUEtiapine (SEROQUEL) 25 MG tablet Take 1 tablet (25 mg total) by mouth at bedtime. 30 tablet 2  . RENVELA 800 MG tablet Take 800-1,600 mg by mouth 5 (five) times daily. Pt takes two tablets three times daily with meals and one tablet two times daily with snacks.    . senna (SENOKOT) 8.6 MG TABS tablet Take 2 tablets (17.2 mg total) by mouth daily as needed for mild constipation. 30 each 0  . traMADol (ULTRAM) 50 MG tablet Take 1 tablet (50 mg total) by mouth every 6 (six) hours as needed for moderate pain or severe pain. 30 tablet 0   No current facility-administered medications for this visit.     Allergies  Allergen Reactions  . Angiotensin Receptor Blockers Other (See Comments)    Reaction:  Hyperkalemia  . Penicillins Rash and Other (See Comments)    rash     REVIEW OF SYSTEMS (Negative unless checked)  Constitutional: _0 Weight loss  _1 Fever  _2 Chills Cardiac: _3 Chest pain   _4 Chest pressure   _5 Palpitations   _6 Shortness of breath when  laying flat   _7 Shortness of breath at rest   _8 Shortness of breath with exertion. Vascular:  _9 Pain in legs with walking   _10 Pain in legs at rest   _11 Pain in legs when laying flat   _12 Claudication   _13 Pain in feet when walking  _14 Pain in feet at rest  _15 Pain in feet when laying flat   _16 History of DVT   _17 Phlebitis   _18 Swelling in legs   _19 Varicose veins   _20 Non-healing ulcers Pulmonary:   _21 Uses home oxygen   _22 Productive cough   _23 Hemoptysis   _24 Wheeze  _25 COPD    Neurologic:  _26 Dizziness  _27 Blackouts   _28   Seizures   _0 History of stroke   _1 History of TIA  _2 Aphasia   _3 Temporary blindness   _4 Dysphagia   _5 Weakness or numbness in arms   _6 Weakness or numbness in legs Musculoskeletal:  _7 Arthritis   _8 Joint swelling   _9 Joint pain   _10 Low back pain Hematologic:  _11 Easy bruising  _12 Easy bleeding   _13 Hypercoagulable state   _14 Anemic  _15 Thrombocytopenia Gastrointestinal:  _16 Blood in stool   _17 Vomiting blood  _18 Gastroesophageal reflux/heartburn   _19 Difficulty swallowing. Genitourinary:  _20 Chronic kidney disease   _21 Difficult urination  _22 Frequent urination  _23 Burning with urination   _24 Blood in urine Skin:  _25 Rashes   _26 Ulcers   _27 Wounds Psychological:  _28 History of anxiety   _29  History of major depression.  Physical Examination  Vitals:   10/12/16 0829  BP: (!) 99/55  Pulse: 94  Resp: 17  Weight: 218 lb (98.9 kg)  Height: _30  (1.626 m)   Body mass index is 37.42 kg/m. Gen:  WD/WN, Debilitated appearing Head: [place]/AT, No temporalis wasting. Ear/Nose/Throat: Hearing grossly intact, trachea midline Eyes: Conjunctiva clear. Sclera non-icteric Neck: Supple.  No JVD. Trachea midline Pulmonary:  Good air movement, respirations not labored, no use of accessory muscles.  Cardiac: RRR, normal S1, S2. Vascular:  Vessel Right Left  Radial Palpable Palpable                                   Gastrointestinal: soft, non-tender/non-distended. No guarding/reflex.  Musculoskeletal:No  deformity or atrophy. 3-4+ right lower extremity edema and 3+ left lower extremity edema. Neurologic: Sensation grossly intact in extremities. Speech is fluent. Somewhat lethargic Psychiatric: Patient is somnolent and husband provides much of the history Dermatologic: No rashes or ulcers noted.  No cellulitis or open wounds. Lymph : No Cervical, Axillary, or Inguinal lymphadenopathy.     Labs Recent Results (from the past 2160 hour(s))  CBC     Status: Abnormal   Collection Time: 09/09/16  5:54 PM  Result Value Ref Range   WBC 15.2 (H) 3.6 - 11.0 K/uL   RBC 3.47 (L) 3.80 - 5.20 MIL/uL   Hemoglobin 11.8 (L) 12.0 - 16.0 g/dL   HCT 35.2 35.0 - 47.0 %   MCV 101.3 (H) 80.0 - 100.0 fL   MCH 34.0 26.0 - 34.0 pg   MCHC 33.5 32.0 - 36.0 g/dL   RDW 15.7 (H) 11.5 - 14.5 %   Platelets 159 150 - 440 K/uL  Comprehensive metabolic panel     Status: Abnormal   Collection Time: 09/09/16  5:54 PM  Result Value Ref Range   Sodium 125 (L) 135 - 145 mmol/L   Potassium 5.6 (H) 3.5 - 5.1 mmol/L    Comment: HEMOLYSIS AT THIS LEVEL MAY AFFECT RESULT   Chloride 95 (L) 101 - 111 mmol/L   CO2 23 22 - 32 mmol/L   Glucose, Bld 153 (H) 65 - 99 mg/dL   BUN 41 (H) 6 - 20 mg/dL   Creatinine, Ser 4.11 (H) 0.44 - 1.00 mg/dL   Calcium 8.8 (L) 8.9 - 10.3 mg/dL   Total Protein 7.1 6.5 - 8.1 g/dL   Albumin 3.2 (L) 3.5 - 5.0 g/dL   AST 40 15 - 41 U/L   ALT 13 (L) 14 - 54 U/L   Alkaline Phosphatase 260 (H) 38 - 126 U/L   Total Bilirubin 1.9 (H) 0.3 - 1.2 mg/dL   GFR calc non Af Amer 9 (L) >60 mL/min  GFR calc Af Amer 11 (L) >60 mL/min    Comment: (NOTE) The eGFR has been calculated using the CKD EPI equation. This calculation has not been validated in all clinical situations. eGFR's persistently <60 mL/min signify possible Chronic Kidney Disease.    Anion gap 7 5 - 15  Troponin I     Status: Abnormal   Collection Time: 09/09/16  5:54 PM  Result Value Ref Range   Troponin I 0.03 (HH) <0.03 ng/mL     Comment: CRITICAL RESULT CALLED TO, READ BACK BY AND VERIFIED WITH ALIVIA TAYLOR 09/09/16 @ 1944  MLK   Glucose, capillary     Status: Abnormal   Collection Time: 09/10/16 12:25 AM  Result Value Ref Range   Glucose-Capillary 155 (H) 65 - 99 mg/dL  Troponin I     Status: Abnormal   Collection Time: 09/10/16 12:29 AM  Result Value Ref Range   Troponin I 0.03 (HH) <0.03 ng/mL    Comment: CRITICAL VALUE NOTED. VALUE IS CONSISTENT WITH PREVIOUSLY REPORTED/CALLED VALUE BY MLK/CAF   Magnesium     Status: None   Collection Time: 09/10/16 12:29 AM  Result Value Ref Range   Magnesium 1.8 1.7 - 2.4 mg/dL  Phosphorus     Status: None   Collection Time: 09/10/16 12:29 AM  Result Value Ref Range   Phosphorus 4.1 2.5 - 4.6 mg/dL  Glucose, capillary     Status: Abnormal   Collection Time: 09/10/16  1:41 AM  Result Value Ref Range   Glucose-Capillary 159 (H) 65 - 99 mg/dL  MRSA PCR Screening     Status: Abnormal   Collection Time: 09/10/16  1:47 AM  Result Value Ref Range   MRSA by PCR POSITIVE (A) NEGATIVE    Comment:        The GeneXpert MRSA Assay (FDA approved for NASAL specimens only), is one component of a comprehensive MRSA colonization surveillance program. It is not intended to diagnose MRSA infection nor to guide or monitor treatment for MRSA infections. RESULT CALLED TO, READ BACK BY AND VERIFIED WITH: MONIQUE ROBERSON ON 09/10/16 AT 0359 BY TLB   Troponin I     Status: Abnormal   Collection Time: 09/10/16  6:23 AM  Result Value Ref Range   Troponin I 0.07 (HH) <0.03 ng/mL    Comment: CRITICAL VALUE NOTED. VALUE IS CONSISTENT WITH PREVIOUSLY REPORTED/CALLED VALUE...[facility]  CBC     Status: Abnormal   Collection Time: 09/10/16  6:23 AM  Result Value Ref Range   WBC 16.2 (H) 3.6 - 11.0 K/uL   RBC 3.65 (L) 3.80 - 5.20 MIL/uL   Hemoglobin 12.2 12.0 - 16.0 g/dL   HCT 38.3 35.0 - 47.0 %   MCV 104.9 (H) 80.0 - 100.0 fL   MCH 33.4 26.0 - 34.0 pg   MCHC 31.8 (L) 32.0 - 36.0 g/dL     RDW 15.8 (H) 11.5 - 14.5 %   Platelets 148 (L) 150 - 440 K/uL  Comprehensive metabolic panel     Status: Abnormal   Collection Time: 09/10/16  6:23 AM  Result Value Ref Range   Sodium 125 (L) 135 - 145 mmol/L   Potassium 6.4 (HH) 3.5 - 5.1 mmol/L    Comment: CRITICAL RESULT CALLED TO, READ BACK BY AND VERIFIED WITH BRITTANY RUDD AT 7290 ON 09/10/16.Marland KitchenMarland KitchenMaywood    Chloride 97 (L) 101 - 111 mmol/L   CO2 14 (L) 22 - 32 mmol/L   Glucose, Bld 214 (H) 65 - 99 mg/dL  BUN 45 (H) 6 - 20 mg/dL   Creatinine, Ser 4.31 (H) 0.44 - 1.00 mg/dL   Calcium 8.8 (L) 8.9 - 10.3 mg/dL   Total Protein 7.2 6.5 - 8.1 g/dL   Albumin 3.2 (L) 3.5 - 5.0 g/dL   AST 37 15 - 41 U/L   ALT 14 14 - 54 U/L   Alkaline Phosphatase 245 (H) 38 - 126 U/L   Total Bilirubin 2.2 (H) 0.3 - 1.2 mg/dL   GFR calc non Af Amer 9 (L) >60 mL/min   GFR calc Af Amer 10 (L) >60 mL/min    Comment: (NOTE) The eGFR has been calculated using the CKD EPI equation. This calculation has not been validated in all clinical situations. eGFR's persistently <60 mL/min signify possible Chronic Kidney Disease.    Anion gap 14 5 - 15  Glucose, capillary     Status: Abnormal   Collection Time: 09/10/16  7:41 AM  Result Value Ref Range   Glucose-Capillary 245 (H) 65 - 99 mg/dL  CULTURE, BLOOD (ROUTINE X 2) w Reflex to ID Panel     Status: None   Collection Time: 09/10/16  8:19 AM  Result Value Ref Range   Specimen Description BLOOD RIGHT HAND    Special Requests BOTTLES DRAWN AEROBIC AND ANAEROBIC .5ML    Culture NO GROWTH 5 DAYS    Report Status 09/15/2016 FINAL   Lactic acid, plasma     Status: Abnormal   Collection Time: 09/10/16  8:39 AM  Result Value Ref Range   Lactic Acid, Venous 5.3 (HH) 0.5 - 1.9 mmol/L    Comment: CRITICAL RESULT CALLED TO, READ BACK BY AND VERIFIED WITH BRITTANY RUDD 09/10/16 0938 TCH   Procalcitonin - Baseline     Status: None   Collection Time: 09/10/16  8:39 AM  Result Value Ref Range   Procalcitonin <0.10  ng/mL    Comment:        Interpretation: PCT (Procalcitonin) <= 0.5 ng/mL: Systemic infection (sepsis) is not likely. Local bacterial infection is possible. (NOTE)         ICU PCT Algorithm               Non ICU PCT Algorithm    ----------------------------     ------------------------------         PCT < 0.25 ng/mL                 PCT < 0.1 ng/mL     Stopping of antibiotics            Stopping of antibiotics       strongly encouraged.               strongly encouraged.    ----------------------------     ------------------------------       PCT level decrease by               PCT < 0.25 ng/mL       >= 80% from peak PCT       OR PCT 0.25 - 0.5 ng/mL          Stopping of antibiotics                                             encouraged.     Stopping of antibiotics  encouraged.    ----------------------------     ------------------------------       PCT level decrease by              PCT >= 0.25 ng/mL       < 80% from peak PCT        AND PCT >= 0.5 ng/mL            Continuin g antibiotics                                              encouraged.       Continuing antibiotics            encouraged.    ----------------------------     ------------------------------     PCT level increase compared          PCT > 0.5 ng/mL         with peak PCT AND          PCT >= 0.5 ng/mL             Escalation of antibiotics                                          strongly encouraged.      Escalation of antibiotics        strongly encouraged.   Phosphorus     Status: Abnormal   Collection Time: 09/10/16 10:05 AM  Result Value Ref Range   Phosphorus 5.7 (H) 2.5 - 4.6 mg/dL  Lactic acid, plasma     Status: Abnormal   Collection Time: 09/10/16 11:45 AM  Result Value Ref Range   Lactic Acid, Venous 4.1 (HH) 0.5 - 1.9 mmol/L    Comment: CRITICAL RESULT CALLED TO, READ BACK BY AND VERIFIED WITH BRITTANY RUDD AT 1240 ON 09/10/16.Marland KitchenMarland KitchenSumner Community Hospital   Fibrin derivatives D-Dimer ([facility] only)     Status:  Abnormal   Collection Time: 09/10/16 11:45 AM  Result Value Ref Range   Fibrin derivatives D-dimer ([facility]) >7,500 (H) 0 - 499    Comment: <> Exclusion of Venous Thromboembolism (VTE) - OUTPATIENTS ONLY        (Emergency Department or [place])             0-499 ng/ml (FEU)  : With a low to intermediate pretest                                        probability for VTE this test result                                        excludes the diagnosis of VTE.           > 499 ng/ml (FEU)  : VTE not excluded.  Additional work up                                   for VTE is required.   <>  Testing on Inpatients and Evaluation of Disseminated Intravascular  Coagulation (DIC)             Reference Range:   0-499 ng/ml (FEU)   Troponin I     Status: Abnormal   Collection Time: 09/10/16 11:45 AM  Result Value Ref Range   Troponin I 0.20 (HH) <0.03 ng/mL    Comment: CRITICAL VALUE NOTED. VALUE IS CONSISTENT WITH PREVIOUSLY REPORTED/CALLED VALUE...[facility]  Protime-INR     Status: Abnormal   Collection Time: 09/10/16 11:45 AM  Result Value Ref Range   Prothrombin Time 15.7 (H) 11.4 - 15.2 seconds   INR 1.24   APTT     Status: Abnormal   Collection Time: 09/10/16 11:45 AM  Result Value Ref Range   aPTT 44 (H) 24 - 36 seconds    Comment:        IF BASELINE aPTT IS ELEVATED, SUGGEST PATIENT RISK ASSESSMENT BE USED TO DETERMINE APPROPRIATE ANTICOAGULANT THERAPY.   Glucose, capillary     Status: Abnormal   Collection Time: 09/10/16 11:58 AM  Result Value Ref Range   Glucose-Capillary 183 (H) 65 - 99 mg/dL   Comment 1 Notify RN   Blood gas, arterial     Status: Abnormal   Collection Time: 09/10/16 12:00 PM  Result Value Ref Range   FIO2 0.32    Delivery systems NASAL CANNULA    pH, Arterial 7.40 7.350 - 7.450   pCO2 arterial 41 32.0 - 48.0 mmHg   pO2, Arterial 53 (L) 83.0 - 108.0 mmHg   Bicarbonate 25.4 20.0 - 28.0 mmol/L   Acid-Base Excess 0.5 0.0 - 2.0 mmol/L   O2 Saturation 87.0 %    Patient temperature 37.0    Collection site RIGHT RADIAL    Sample type ARTERIAL DRAW    Allens test (pass/fail) PASS PASS  Blood gas, venous     Status: Abnormal   Collection Time: 09/10/16  1:10 PM  Result Value Ref Range   FIO2 0.40    Delivery systems NASAL CANNULA    pH, Ven 7.37 7.250 - 7.430   pCO2, Ven 51 44.0 - 60.0 mmHg   pO2, Ven 32.0 32.0 - 45.0 mmHg   Bicarbonate 29.5 (H) 20.0 - 28.0 mmol/L   Acid-Base Excess 3.4 (H) 0.0 - 2.0 mmol/L   O2 Saturation 59.4 %   Patient temperature 37.0    Collection site VENOUS    Sample type VENOUS   Potassium     Status: Abnormal   Collection Time: 09/10/16  1:50 PM  Result Value Ref Range   Potassium 2.6 (LL) 3.5 - 5.1 mmol/L    Comment: CRITICAL RESULT CALLED TO, READ BACK BY AND VERIFIED WITH TRACY COLLIE 09/10/16 1422 SGD   ECHOCARDIOGRAM COMPLETE     Status: None   Collection Time: 09/10/16  3:47 PM  Result Value Ref Range   Weight 3,492.09 oz   Height 61 in   BP 115/84 mmHg  Glucose, capillary     Status: Abnormal   Collection Time: 09/10/16  4:30 PM  Result Value Ref Range   Glucose-Capillary 129 (H) 65 - 99 mg/dL  Potassium     Status: None   Collection Time: 09/10/16  5:41 PM  Result Value Ref Range   Potassium 3.6 3.5 - 5.1 mmol/L  CULTURE, BLOOD (ROUTINE X 2) w Reflex to ID Panel     Status: None   Collection Time: 09/10/16  6:04 PM  Result Value Ref Range   Specimen Description BLOOD RIGHT HAND    Special Requests BOTTLES  DRAWN AEROBIC AND ANAEROBIC .5ML    Culture NO GROWTH 5 DAYS    Report Status 09/15/2016 FINAL   Glucose, capillary     Status: None   Collection Time: 09/10/16  9:17 PM  Result Value Ref Range   Glucose-Capillary 85 65 - 99 mg/dL  Heparin level (unfractionated)     Status: None   Collection Time: 09/10/16 10:23 PM  Result Value Ref Range   Heparin Unfractionated 0.52 0.30 - 0.70 IU/mL    Comment:        IF HEPARIN RESULTS ARE BELOW EXPECTED VALUES, AND PATIENT DOSAGE HAS BEEN  CONFIRMED, SUGGEST FOLLOW UP TESTING OF ANTITHROMBIN III LEVELS.   Blood gas, arterial     Status: Abnormal   Collection Time: 09/10/16 10:53 PM  Result Value Ref Range   FIO2 1.00    Delivery systems BILEVEL POSITIVE AIRWAY PRESSURE    Inspiratory PAP 14    Expiratory PAP 8    pH, Arterial 7.43 7.350 - 7.450   pCO2 arterial 35 32.0 - 48.0 mmHg   pO2, Arterial 230 (H) 83.0 - 108.0 mmHg   Bicarbonate 23.2 20.0 - 28.0 mmol/L   Acid-base deficit 0.7 0.0 - 2.0 mmol/L   O2 Saturation 99.8 %   Patient temperature 37.0    Collection site RIGHT RADIAL    Sample type ARTERIAL DRAW    Allens test (pass/fail) PASS PASS   Mechanical Rate 8   Basic metabolic panel     Status: Abnormal   Collection Time: 09/11/16  2:12 AM  Result Value Ref Range   Sodium 129 (L) 135 - 145 mmol/L   Potassium 4.1 3.5 - 5.1 mmol/L   Chloride 93 (L) 101 - 111 mmol/L   CO2 23 22 - 32 mmol/L   Glucose, Bld 98 65 - 99 mg/dL   BUN 26 (H) 6 - 20 mg/dL   Creatinine, Ser 3.21 (H) 0.44 - 1.00 mg/dL   Calcium 8.9 8.9 - 10.3 mg/dL   GFR calc non Af Amer 13 (L) >60 mL/min   GFR calc Af Amer 15 (L) >60 mL/min    Comment: (NOTE) The eGFR has been calculated using the CKD EPI equation. This calculation has not been validated in all clinical situations. eGFR's persistently <60 mL/min signify possible Chronic Kidney Disease.    Anion gap 13 5 - 15  Magnesium     Status: None   Collection Time: 09/11/16  2:12 AM  Result Value Ref Range   Magnesium 1.8 1.7 - 2.4 mg/dL  Phosphorus     Status: None   Collection Time: 09/11/16  2:12 AM  Result Value Ref Range   Phosphorus 4.1 2.5 - 4.6 mg/dL  CBC with Differential/Platelet     Status: Abnormal   Collection Time: 09/11/16  2:12 AM  Result Value Ref Range   WBC 21.8 (H) 3.6 - 11.0 K/uL   RBC 3.37 (L) 3.80 - 5.20 MIL/uL   Hemoglobin 11.1 (L) 12.0 - 16.0 g/dL   HCT 33.8 (L) 35.0 - 47.0 %   MCV 100.2 (H) 80.0 - 100.0 fL   MCH 33.0 26.0 - 34.0 pg   MCHC 33.0 32.0 -  36.0 g/dL   RDW 15.9 (H) 11.5 - 14.5 %   Platelets 148 (L) 150 - 440 K/uL   Neutrophils Relative % 80 %   Neutro Abs 17.5 (H) 1.4 - 6.5 K/uL   Lymphocytes Relative 11 %   Lymphs Abs 2.3 1.0 - 3.6 K/uL   Monocytes  Relative 8 %   Monocytes Absolute 1.8 (H) 0.2 - 0.9 K/uL   Eosinophils Relative 0 %   Eosinophils Absolute 0.1 0 - 0.7 K/uL   Basophils Relative 1 %   Basophils Absolute 0.1 0 - 0.1 K/uL  Heparin level (unfractionated)     Status: None   Collection Time: 09/11/16  6:58 AM  Result Value Ref Range   Heparin Unfractionated 0.64 0.30 - 0.70 IU/mL    Comment:        IF HEPARIN RESULTS ARE BELOW EXPECTED VALUES, AND PATIENT DOSAGE HAS BEEN CONFIRMED, SUGGEST FOLLOW UP TESTING OF ANTITHROMBIN III LEVELS.   Procalcitonin     Status: None   Collection Time: 09/11/16  6:58 AM  Result Value Ref Range   Procalcitonin 5.47 ng/mL    Comment:        Interpretation: PCT > 2 ng/mL: Systemic infection (sepsis) is likely, unless other causes are known. (NOTE)         ICU PCT Algorithm               Non ICU PCT Algorithm    ----------------------------     ------------------------------         PCT < 0.25 ng/mL                 PCT < 0.1 ng/mL     Stopping of antibiotics            Stopping of antibiotics       strongly encouraged.               strongly encouraged.    ----------------------------     ------------------------------       PCT level decrease by               PCT < 0.25 ng/mL       >= 80% from peak PCT       OR PCT 0.25 - 0.5 ng/mL          Stopping of antibiotics                                             encouraged.     Stopping of antibiotics           encouraged.    ----------------------------     ------------------------------       PCT level decrease by              PCT >= 0.25 ng/mL       < 80% from peak PCT        AND PCT >= 0.5 ng/mL            Continuing antibiotics                                               encouraged.       Continuing  antibiotics            encouraged.    ----------------------------     ------------------------------     PCT level increase compared          PCT > 0.5 ng/mL         with peak PCT AND  PCT >= 0.5 ng/mL             Escalation of antibiotics                                          strongly encouraged.      Escalation of antibiotics        strongly encouraged.   Glucose, capillary     Status: Abnormal   Collection Time: 09/11/16  7:20 AM  Result Value Ref Range   Glucose-Capillary 104 (H) 65 - 99 mg/dL  Glucose, capillary     Status: Abnormal   Collection Time: 09/11/16 11:42 AM  Result Value Ref Range   Glucose-Capillary 115 (H) 65 - 99 mg/dL  Glucose, capillary     Status: Abnormal   Collection Time: 09/11/16  3:55 PM  Result Value Ref Range   Glucose-Capillary 126 (H) 65 - 99 mg/dL  Glucose, capillary     Status: Abnormal   Collection Time: 09/11/16  9:37 PM  Result Value Ref Range   Glucose-Capillary 167 (H) 65 - 99 mg/dL   Comment 1 Notify RN   Procalcitonin     Status: None   Collection Time: 09/12/16  5:04 AM  Result Value Ref Range   Procalcitonin 7.26 ng/mL    Comment:        Interpretation: PCT > 2 ng/mL: Systemic infection (sepsis) is likely, unless other causes are known. (NOTE)         ICU PCT Algorithm               Non ICU PCT Algorithm    ----------------------------     ------------------------------         PCT < 0.25 ng/mL                 PCT < 0.1 ng/mL     Stopping of antibiotics            Stopping of antibiotics       strongly encouraged.               strongly encouraged.    ----------------------------     ------------------------------       PCT level decrease by               PCT < 0.25 ng/mL       >= 80% from peak PCT       OR PCT 0.25 - 0.5 ng/mL          Stopping of antibiotics                                             encouraged.     Stopping of antibiotics           encouraged.    ----------------------------      ------------------------------       PCT level decrease by              PCT >= 0.25 ng/mL       < 80% from peak PCT        AND PCT >= 0.5 ng/mL            Continuing antibiotics  encouraged.       Continuing antibiotics            encouraged.    ----------------------------     ------------------------------     PCT level increase compared          PCT > 0.5 ng/mL         with peak PCT AND          PCT >= 0.5 ng/mL             Escalation of antibiotics                                          strongly encouraged.      Escalation of antibiotics        strongly encouraged.   Heparin level (unfractionated)     Status: Abnormal   Collection Time: 09/12/16  5:04 AM  Result Value Ref Range   Heparin Unfractionated 0.13 (L) 0.30 - 0.70 IU/mL    Comment:        IF HEPARIN RESULTS ARE BELOW EXPECTED VALUES, AND PATIENT DOSAGE HAS BEEN CONFIRMED, SUGGEST FOLLOW UP TESTING OF ANTITHROMBIN III LEVELS.   Basic metabolic panel     Status: Abnormal   Collection Time: 09/12/16  5:04 AM  Result Value Ref Range   Sodium 127 (L) 135 - 145 mmol/L   Potassium 5.3 (H) 3.5 - 5.1 mmol/L   Chloride 92 (L) 101 - 111 mmol/L   CO2 22 22 - 32 mmol/L   Glucose, Bld 190 (H) 65 - 99 mg/dL   BUN 46 (H) 6 - 20 mg/dL   Creatinine, Ser 4.39 (H) 0.44 - 1.00 mg/dL   Calcium 8.6 (L) 8.9 - 10.3 mg/dL   GFR calc non Af Amer 9 (L) >60 mL/min   GFR calc Af Amer 10 (L) >60 mL/min    Comment: (NOTE) The eGFR has been calculated using the CKD EPI equation. This calculation has not been validated in all clinical situations. eGFR's persistently <60 mL/min signify possible Chronic Kidney Disease.    Anion gap 13 5 - 15  CBC with Differential/Platelet     Status: Abnormal   Collection Time: 09/12/16  5:04 AM  Result Value Ref Range   WBC 17.1 (H) 3.6 - 11.0 K/uL   RBC 3.16 (L) 3.80 - 5.20 MIL/uL   Hemoglobin 10.5 (L) 12.0 - 16.0 g/dL   HCT 31.8 (L) 35.0 - 47.0 %   MCV  100.8 (H) 80.0 - 100.0 fL   MCH 33.2 26.0 - 34.0 pg   MCHC 33.0 32.0 - 36.0 g/dL   RDW 16.1 (H) 11.5 - 14.5 %   Platelets 132 (L) 150 - 440 K/uL   Neutrophils Relative % 81 %   Lymphocytes Relative 10 %   Monocytes Relative 5 %   Eosinophils Relative 0 %   Basophils Relative 0 %   Band Neutrophils 2 %   Metamyelocytes Relative 1 %   Myelocytes 1 %   Promyelocytes Absolute 0 %   Blasts 0 %   nRBC 0 0 /100 WBC   Other 0 %   Neutro Abs 14.5 (H) 1.4 - 6.5 K/uL   Lymphs Abs 1.7 1.0 - 3.6 K/uL   Monocytes Absolute 0.9 0.2 - 0.9 K/uL   Eosinophils Absolute 0.0 0 - 0.7 K/uL   Basophils Absolute 0.0 0 - 0.1 K/uL  RBC Morphology MIXED RBC POPULATION    Smear Review LARGE PLATELETS PRESENT   Glucose, capillary     Status: Abnormal   Collection Time: 09/12/16  8:19 AM  Result Value Ref Range   Glucose-Capillary 203 (H) 65 - 99 mg/dL  Glucose, capillary     Status: Abnormal   Collection Time: 09/12/16 11:11 AM  Result Value Ref Range   Glucose-Capillary 189 (H) 65 - 99 mg/dL  Phosphorus     Status: Abnormal   Collection Time: 09/12/16  5:06 PM  Result Value Ref Range   Phosphorus 6.4 (H) 2.5 - 4.6 mg/dL  Glucose, capillary     Status: Abnormal   Collection Time: 09/12/16  5:44 PM  Result Value Ref Range   Glucose-Capillary 136 (H) 65 - 99 mg/dL  Glucose, capillary     Status: Abnormal   Collection Time: 09/12/16  9:26 PM  Result Value Ref Range   Glucose-Capillary 115 (H) 65 - 99 mg/dL   Comment 1 Notify RN   Basic metabolic panel     Status: Abnormal   Collection Time: 09/13/16  4:15 AM  Result Value Ref Range   Sodium 134 (L) 135 - 145 mmol/L   Potassium 3.7 3.5 - 5.1 mmol/L   Chloride 95 (L) 101 - 111 mmol/L   CO2 29 22 - 32 mmol/L   Glucose, Bld 161 (H) 65 - 99 mg/dL   BUN 29 (H) 6 - 20 mg/dL   Creatinine, Ser 2.82 (H) 0.44 - 1.00 mg/dL   Calcium 8.4 (L) 8.9 - 10.3 mg/dL   GFR calc non Af Amer 15 (L) >60 mL/min   GFR calc Af Amer 17 (L) >60 mL/min    Comment:  (NOTE) The eGFR has been calculated using the CKD EPI equation. This calculation has not been validated in all clinical situations. eGFR's persistently <60 mL/min signify possible Chronic Kidney Disease.    Anion gap 10 5 - 15  CBC     Status: Abnormal   Collection Time: 09/13/16  4:15 AM  Result Value Ref Range   WBC 13.5 (H) 3.6 - 11.0 K/uL   RBC 3.05 (L) 3.80 - 5.20 MIL/uL   Hemoglobin 10.3 (L) 12.0 - 16.0 g/dL   HCT 30.4 (L) 35.0 - 47.0 %   MCV 99.8 80.0 - 100.0 fL   MCH 33.9 26.0 - 34.0 pg   MCHC 34.0 32.0 - 36.0 g/dL   RDW 16.0 (H) 11.5 - 14.5 %   Platelets 112 (L) 150 - 440 K/uL  Magnesium     Status: None   Collection Time: 09/13/16  4:15 AM  Result Value Ref Range   Magnesium 1.8 1.7 - 2.4 mg/dL  Phosphorus     Status: None   Collection Time: 09/13/16  4:15 AM  Result Value Ref Range   Phosphorus 4.2 2.5 - 4.6 mg/dL  Glucose, capillary     Status: Abnormal   Collection Time: 09/13/16  7:22 AM  Result Value Ref Range   Glucose-Capillary 164 (H) 65 - 99 mg/dL  Glucose, capillary     Status: Abnormal   Collection Time: 09/13/16 11:05 AM  Result Value Ref Range   Glucose-Capillary 181 (H) 65 - 99 mg/dL  Glucose, capillary     Status: Abnormal   Collection Time: 09/13/16 12:42 PM  Result Value Ref Range   Glucose-Capillary 164 (H) 65 - 99 mg/dL  Glucose, capillary     Status: Abnormal   Collection Time: 09/13/16  4:14 PM  Result Value Ref Range   Glucose-Capillary 187 (H) 65 - 99 mg/dL  Glucose, capillary     Status: Abnormal   Collection Time: 09/13/16  8:56 PM  Result Value Ref Range   Glucose-Capillary 157 (H) 65 - 99 mg/dL  CBC     Status: Abnormal   Collection Time: 09/14/16  5:08 AM  Result Value Ref Range   WBC 14.0 (H) 3.6 - 11.0 K/uL   RBC 3.13 (L) 3.80 - 5.20 MIL/uL   Hemoglobin 10.6 (L) 12.0 - 16.0 g/dL   HCT 31.6 (L) 35.0 - 47.0 %   MCV 101.0 (H) 80.0 - 100.0 fL   MCH 34.0 26.0 - 34.0 pg   MCHC 33.6 32.0 - 36.0 g/dL   RDW 16.3 (H) 11.5 - 14.5 %    Platelets 108 (L) 150 - 440 K/uL  Glucose, capillary     Status: Abnormal   Collection Time: 09/14/16  7:28 AM  Result Value Ref Range   Glucose-Capillary 158 (H) 65 - 99 mg/dL  Glucose, capillary     Status: Abnormal   Collection Time: 09/14/16 11:29 AM  Result Value Ref Range   Glucose-Capillary 175 (H) 65 - 99 mg/dL  Glucose, capillary     Status: Abnormal   Collection Time: 09/14/16  6:52 PM  Result Value Ref Range   Glucose-Capillary 110 (H) 65 - 99 mg/dL  Glucose, capillary     Status: Abnormal   Collection Time: 09/14/16  9:06 PM  Result Value Ref Range   Glucose-Capillary 100 (H) 65 - 99 mg/dL  Glucose, capillary     Status: Abnormal   Collection Time: 09/15/16  7:53 AM  Result Value Ref Range   Glucose-Capillary 160 (H) 65 - 99 mg/dL  Glucose, capillary     Status: Abnormal   Collection Time: 09/15/16 11:42 AM  Result Value Ref Range   Glucose-Capillary 194 (H) 65 - 99 mg/dL  Glucose, capillary     Status: Abnormal   Collection Time: 09/15/16  3:42 PM  Result Value Ref Range   Glucose-Capillary 164 (H) 65 - 99 mg/dL  Glucose, capillary     Status: Abnormal   Collection Time: 09/15/16  9:29 PM  Result Value Ref Range   Glucose-Capillary 206 (H) 65 - 99 mg/dL   Comment 1 Notify RN   Glucose, capillary     Status: Abnormal   Collection Time: 09/16/16  7:27 AM  Result Value Ref Range   Glucose-Capillary 132 (H) 65 - 99 mg/dL  Glucose, capillary     Status: Abnormal   Collection Time: 09/16/16 11:33 AM  Result Value Ref Range   Glucose-Capillary 182 (H) 65 - 99 mg/dL  Vancomycin, random     Status: None   Collection Time: 09/16/16  3:00 PM  Result Value Ref Range   Vancomycin Rm 24     Comment:        Random Vancomycin therapeutic range is dependent on dosage and time of specimen collection. A peak range is 20.0-40.0 ug/mL A trough range is 5.0-15.0 ug/mL          Glucose, capillary     Status: Abnormal   Collection Time: 09/16/16  5:29 PM  Result Value  Ref Range   Glucose-Capillary 134 (H) 65 - 99 mg/dL  Glucose, capillary     Status: Abnormal   Collection Time: 09/16/16  9:06 PM  Result Value Ref Range   Glucose-Capillary 156 (H) 65 - 99 mg/dL   Comment 1 Notify RN  CBC     Status: Abnormal   Collection Time: 09/17/16  3:23 AM  Result Value Ref Range   WBC 12.8 (H) 3.6 - 11.0 K/uL   RBC 3.16 (L) 3.80 - 5.20 MIL/uL   Hemoglobin 10.9 (L) 12.0 - 16.0 g/dL   HCT 32.2 (L) 35.0 - 47.0 %   MCV 102.1 (H) 80.0 - 100.0 fL   MCH 34.6 (H) 26.0 - 34.0 pg   MCHC 33.9 32.0 - 36.0 g/dL   RDW 16.7 (H) 11.5 - 14.5 %   Platelets 82 (L) 150 - 440 K/uL  Glucose, capillary     Status: Abnormal   Collection Time: 09/17/16  7:50 AM  Result Value Ref Range   Glucose-Capillary 173 (H) 65 - 99 mg/dL   Comment 1 Notify RN   Glucose, capillary     Status: Abnormal   Collection Time: 09/17/16 11:08 AM  Result Value Ref Range   Glucose-Capillary 230 (H) 65 - 99 mg/dL   Comment 1 Notify RN   Glucose, capillary     Status: Abnormal   Collection Time: 09/17/16  5:27 PM  Result Value Ref Range   Glucose-Capillary 189 (H) 65 - 99 mg/dL   Comment 1 Notify RN   Glucose, capillary     Status: Abnormal   Collection Time: 09/17/16  9:19 PM  Result Value Ref Range   Glucose-Capillary 226 (H) 65 - 99 mg/dL  CBC     Status: Abnormal   Collection Time: 09/18/16  3:24 AM  Result Value Ref Range   WBC 13.5 (H) 3.6 - 11.0 K/uL   RBC 3.06 (L) 3.80 - 5.20 MIL/uL   Hemoglobin 10.3 (L) 12.0 - 16.0 g/dL   HCT 31.8 (L) 35.0 - 47.0 %   MCV 103.8 (H) 80.0 - 100.0 fL   MCH 33.7 26.0 - 34.0 pg   MCHC 32.5 32.0 - 36.0 g/dL   RDW 17.9 (H) 11.5 - 14.5 %   Platelets 74 (L) 150 - 440 K/uL  Basic metabolic panel     Status: Abnormal   Collection Time: 09/18/16  3:24 AM  Result Value Ref Range   Sodium 136 135 - 145 mmol/L   Potassium 3.9 3.5 - 5.1 mmol/L   Chloride 97 (L) 101 - 111 mmol/L   CO2 31 22 - 32 mmol/L   Glucose, Bld 234 (H) 65 - 99 mg/dL   BUN 28 (H) 6 - 20  mg/dL   Creatinine, Ser 4.05 (H) 0.44 - 1.00 mg/dL   Calcium 8.3 (L) 8.9 - 10.3 mg/dL   GFR calc non Af Amer 10 (L) >60 mL/min   GFR calc Af Amer 11 (L) >60 mL/min    Comment: (NOTE) The eGFR has been calculated using the CKD EPI equation. This calculation has not been validated in all clinical situations. eGFR's persistently <60 mL/min signify possible Chronic Kidney Disease.    Anion gap 8 5 - 15  Glucose, capillary     Status: Abnormal   Collection Time: 09/18/16  7:48 AM  Result Value Ref Range   Glucose-Capillary 215 (H) 65 - 99 mg/dL   Comment 1 Notify RN   Glucose, capillary     Status: Abnormal   Collection Time: 09/18/16 11:27 AM  Result Value Ref Range   Glucose-Capillary 232 (H) 65 - 99 mg/dL   Comment 1 Notify RN   Renal function panel     Status: Abnormal   Collection Time: 09/18/16  3:40 PM  Result Value Ref  Range   Sodium 133 (L) 135 - 145 mmol/L   Potassium 4.1 3.5 - 5.1 mmol/L   Chloride 96 (L) 101 - 111 mmol/L   CO2 29 22 - 32 mmol/L   Glucose, Bld 279 (H) 65 - 99 mg/dL   BUN 30 (H) 6 - 20 mg/dL   Creatinine, Ser 4.47 (H) 0.44 - 1.00 mg/dL   Calcium 8.2 (L) 8.9 - 10.3 mg/dL   Phosphorus 2.2 (L) 2.5 - 4.6 mg/dL   Albumin 2.7 (L) 3.5 - 5.0 g/dL   GFR calc non Af Amer 8 (L) >60 mL/min   GFR calc Af Amer 10 (L) >60 mL/min    Comment: (NOTE) The eGFR has been calculated using the CKD EPI equation. This calculation has not been validated in all clinical situations. eGFR's persistently <60 mL/min signify possible Chronic Kidney Disease.    Anion gap 8 5 - 15  CBC     Status: Abnormal   Collection Time: 09/18/16  3:40 PM  Result Value Ref Range   WBC 14.2 (H) 3.6 - 11.0 K/uL   RBC 3.07 (L) 3.80 - 5.20 MIL/uL   Hemoglobin 10.5 (L) 12.0 - 16.0 g/dL   HCT 31.6 (L) 35.0 - 47.0 %   MCV 103.0 (H) 80.0 - 100.0 fL   MCH 34.1 (H) 26.0 - 34.0 pg   MCHC 33.1 32.0 - 36.0 g/dL   RDW 17.6 (H) 11.5 - 14.5 %   Platelets 76 (L) 150 - 440 K/uL  Glucose, capillary      Status: Abnormal   Collection Time: 09/18/16  9:44 PM  Result Value Ref Range   Glucose-Capillary 198 (H) 65 - 99 mg/dL   Comment 1 Notify RN   Glucose, capillary     Status: Abnormal   Collection Time: 09/19/16  7:34 AM  Result Value Ref Range   Glucose-Capillary 171 (H) 65 - 99 mg/dL   Comment 1 Notify RN   Glucose, capillary     Status: Abnormal   Collection Time: 09/19/16 11:16 AM  Result Value Ref Range   Glucose-Capillary 247 (H) 65 - 99 mg/dL   Comment 1 Notify RN   Glucose, capillary     Status: Abnormal   Collection Time: 09/19/16  4:32 PM  Result Value Ref Range   Glucose-Capillary 252 (H) 65 - 99 mg/dL   Comment 1 Notify RN   Glucose, capillary     Status: Abnormal   Collection Time: 09/19/16  9:51 PM  Result Value Ref Range   Glucose-Capillary 205 (H) 65 - 99 mg/dL   Comment 1 Notify RN   Basic metabolic panel     Status: Abnormal   Collection Time: 09/20/16  3:30 AM  Result Value Ref Range   Sodium 133 (L) 135 - 145 mmol/L   Potassium 4.3 3.5 - 5.1 mmol/L   Chloride 95 (L) 101 - 111 mmol/L   CO2 30 22 - 32 mmol/L   Glucose, Bld 155 (H) 65 - 99 mg/dL   BUN 22 (H) 6 - 20 mg/dL   Creatinine, Ser 3.63 (H) 0.44 - 1.00 mg/dL   Calcium 8.4 (L) 8.9 - 10.3 mg/dL   GFR calc non Af Amer 11 (L) >60 mL/min   GFR calc Af Amer 13 (L) >60 mL/min    Comment: (NOTE) The eGFR has been calculated using the CKD EPI equation. This calculation has not been validated in all clinical situations. eGFR's persistently <60 mL/min signify possible Chronic Kidney Disease.    Anion  gap 8 5 - 15  Glucose, capillary     Status: Abnormal   Collection Time: 09/20/16  8:35 AM  Result Value Ref Range   Glucose-Capillary 157 (H) 65 - 99 mg/dL  Glucose, capillary     Status: Abnormal   Collection Time: 09/20/16 11:39 AM  Result Value Ref Range   Glucose-Capillary 247 (H) 65 - 99 mg/dL   Comment 1 Notify RN   CBC     Status: Abnormal   Collection Time: 09/20/16  2:08 PM  Result Value Ref  Range   WBC 12.9 (H) 3.6 - 11.0 K/uL   RBC 3.13 (L) 3.80 - 5.20 MIL/uL   Hemoglobin 10.6 (L) 12.0 - 16.0 g/dL   HCT 32.6 (L) 35.0 - 47.0 %   MCV 104.2 (H) 80.0 - 100.0 fL   MCH 33.9 26.0 - 34.0 pg   MCHC 32.5 32.0 - 36.0 g/dL   RDW 18.9 (H) 11.5 - 14.5 %   Platelets 65 (L) 150 - 440 K/uL  Glucose, capillary     Status: Abnormal   Collection Time: 09/20/16  4:41 PM  Result Value Ref Range   Glucose-Capillary 215 (H) 65 - 99 mg/dL   Comment 1 Notify RN   Glucose, capillary     Status: Abnormal   Collection Time: 09/20/16  9:04 PM  Result Value Ref Range   Glucose-Capillary 125 (H) 65 - 99 mg/dL   Comment 1 Notify RN   Glucose, capillary     Status: Abnormal   Collection Time: 09/21/16  7:13 AM  Result Value Ref Range   Glucose-Capillary 148 (H) 65 - 99 mg/dL  Glucose, capillary     Status: Abnormal   Collection Time: 09/21/16 11:39 AM  Result Value Ref Range   Glucose-Capillary 236 (H) 65 - 99 mg/dL   Comment 1 Notify RN   Comprehensive metabolic panel     Status: Abnormal   Collection Time: 09/26/16 11:48 AM  Result Value Ref Range   Sodium 130 (L) 135 - 145 mmol/L   Potassium 3.6 3.5 - 5.1 mmol/L   Chloride 91 (L) 101 - 111 mmol/L   CO2 33 (H) 22 - 32 mmol/L   Glucose, Bld 209 (H) 65 - 99 mg/dL   BUN 24 (H) 6 - 20 mg/dL   Creatinine, Ser 3.39 (H) 0.44 - 1.00 mg/dL   Calcium 8.8 (L) 8.9 - 10.3 mg/dL   Total Protein 5.8 (L) 6.5 - 8.1 g/dL   Albumin 2.8 (L) 3.5 - 5.0 g/dL   AST 24 15 - 41 U/L   ALT 12 (L) 14 - 54 U/L   Alkaline Phosphatase 162 (H) 38 - 126 U/L   Total Bilirubin 1.5 (H) 0.3 - 1.2 mg/dL   GFR calc non Af Amer 12 (L) >60 mL/min   GFR calc Af Amer 14 (L) >60 mL/min    Comment: (NOTE) The eGFR has been calculated using the CKD EPI equation. This calculation has not been validated in all clinical situations. eGFR's persistently <60 mL/min signify possible Chronic Kidney Disease.    Anion gap 6 5 - 15  Troponin I     Status: Abnormal   Collection Time:  09/26/16 11:48 AM  Result Value Ref Range   Troponin I 0.07 (HH) <0.03 ng/mL    Comment: CRITICAL RESULT CALLED TO, READ BACK BY AND VERIFIED WITH Kainat TEAGUE @ 1610 09/26/16 BY [facility]   Brain natriuretic peptide     Status: Abnormal   Collection Time: 09/26/16  11:48 AM  Result Value Ref Range   B Natriuretic Peptide 663.0 (H) 0.0 - 100.0 pg/mL  CBC with Differential     Status: Abnormal   Collection Time: 09/26/16 11:48 AM  Result Value Ref Range   WBC 8.5 3.6 - 11.0 K/uL   RBC 3.32 (L) 3.80 - 5.20 MIL/uL   Hemoglobin 11.3 (L) 12.0 - 16.0 g/dL   HCT 34.3 (L) 35.0 - 47.0 %   MCV 103.5 (H) 80.0 - 100.0 fL   MCH 34.0 26.0 - 34.0 pg   MCHC 32.8 32.0 - 36.0 g/dL   RDW 18.5 (H) 11.5 - 14.5 %   Platelets 117 (L) 150 - 440 K/uL   Neutrophils Relative % 63 %   Neutro Abs 5.3 1.4 - 6.5 K/uL   Lymphocytes Relative 17 %   Lymphs Abs 1.5 1.0 - 3.6 K/uL   Monocytes Relative 17 %   Monocytes Absolute 1.4 (H) 0.2 - 0.9 K/uL   Eosinophils Relative 2 %   Eosinophils Absolute 0.1 0 - 0.7 K/uL   Basophils Relative 1 %   Basophils Absolute 0.1 0 - 0.1 K/uL  Lactic acid, plasma     Status: None   Collection Time: 09/26/16 12:03 PM  Result Value Ref Range   Lactic Acid, Venous 1.6 0.5 - 1.9 mmol/L  APTT     Status: Abnormal   Collection Time: 09/26/16  4:35 PM  Result Value Ref Range   aPTT 37 (H) 24 - 36 seconds    Comment:        IF BASELINE aPTT IS ELEVATED, SUGGEST PATIENT RISK ASSESSMENT BE USED TO DETERMINE APPROPRIATE ANTICOAGULANT THERAPY.   Protime-INR     Status: None   Collection Time: 09/26/16  4:35 PM  Result Value Ref Range   Prothrombin Time 14.4 11.4 - 15.2 seconds   INR 1.11   Glucose, capillary     Status: Abnormal   Collection Time: 09/26/16  9:14 PM  Result Value Ref Range   Glucose-Capillary 192 (H) 65 - 99 mg/dL  Heparin level (unfractionated)     Status: None   Collection Time: 09/27/16  1:03 AM  Result Value Ref Range   Heparin Unfractionated 0.33 0.30 - 0.70  IU/mL    Comment:        IF HEPARIN RESULTS ARE BELOW EXPECTED VALUES, AND PATIENT DOSAGE HAS BEEN CONFIRMED, SUGGEST FOLLOW UP TESTING OF ANTITHROMBIN III LEVELS.   Comprehensive metabolic panel     Status: Abnormal   Collection Time: 09/27/16  1:03 AM  Result Value Ref Range   Sodium 128 (L) 135 - 145 mmol/L   Potassium 3.9 3.5 - 5.1 mmol/L   Chloride 93 (L) 101 - 111 mmol/L   CO2 29 22 - 32 mmol/L   Glucose, Bld 191 (H) 65 - 99 mg/dL   BUN 28 (H) 6 - 20 mg/dL   Creatinine, Ser 3.77 (H) 0.44 - 1.00 mg/dL   Calcium 8.7 (L) 8.9 - 10.3 mg/dL   Total Protein 5.4 (L) 6.5 - 8.1 g/dL   Albumin 2.6 (L) 3.5 - 5.0 g/dL   AST 22 15 - 41 U/L   ALT 11 (L) 14 - 54 U/L   Alkaline Phosphatase 148 (H) 38 - 126 U/L   Total Bilirubin 1.5 (H) 0.3 - 1.2 mg/dL   GFR calc non Af Amer 10 (L) >60 mL/min   GFR calc Af Amer 12 (L) >60 mL/min    Comment: (NOTE) The eGFR has been calculated using  the CKD EPI equation. This calculation has not been validated in all clinical situations. eGFR's persistently <60 mL/min signify possible Chronic Kidney Disease.    Anion gap 6 5 - 15  CBC     Status: Abnormal   Collection Time: 09/27/16  1:03 AM  Result Value Ref Range   WBC 8.2 3.6 - 11.0 K/uL   RBC 3.08 (L) 3.80 - 5.20 MIL/uL   Hemoglobin 10.6 (L) 12.0 - 16.0 g/dL   HCT 31.4 (L) 35.0 - 47.0 %   MCV 102.0 (H) 80.0 - 100.0 fL   MCH 34.3 (H) 26.0 - 34.0 pg   MCHC 33.6 32.0 - 36.0 g/dL   RDW 18.1 (H) 11.5 - 14.5 %   Platelets 106 (L) 150 - 440 K/uL  Phosphorus     Status: None   Collection Time: 09/27/16  1:03 AM  Result Value Ref Range   Phosphorus 2.5 2.5 - 4.6 mg/dL  Parathyroid hormone, intact (no Ca)     Status: None   Collection Time: 09/27/16  1:03 AM  Result Value Ref Range   PTH 51 15 - 65 pg/mL    Comment: (NOTE) Performed At: [facility] [address] [place], [place] [id number] Lindon Romp MD [id number]   Glucose, capillary     Status: Abnormal   Collection Time:  09/27/16  7:27 AM  Result Value Ref Range   Glucose-Capillary 126 (H) 65 - 99 mg/dL  Heparin level (unfractionated)     Status: None   Collection Time: 09/27/16  9:07 AM  Result Value Ref Range   Heparin Unfractionated 0.36 0.30 - 0.70 IU/mL    Comment:        IF HEPARIN RESULTS ARE BELOW EXPECTED VALUES, AND PATIENT DOSAGE HAS BEEN CONFIRMED, SUGGEST FOLLOW UP TESTING OF ANTITHROMBIN III LEVELS.   Glucose, capillary     Status: Abnormal   Collection Time: 09/27/16 12:02 PM  Result Value Ref Range   Glucose-Capillary 158 (H) 65 - 99 mg/dL  Hepatitis B surface antigen     Status: None   Collection Time: 09/27/16  1:39 PM  Result Value Ref Range   Hepatitis B Surface Ag Negative Negative    Comment: (NOTE) Performed At: [facility] [place], [place] [id number] Lindon Romp MD ER:[id number]   Glucose, capillary     Status: Abnormal   Collection Time: 09/27/16  6:03 PM  Result Value Ref Range   Glucose-Capillary 160 (H) 65 - 99 mg/dL   Comment 1 Notify RN   Glucose, capillary     Status: Abnormal   Collection Time: 09/27/16  9:13 PM  Result Value Ref Range   Glucose-Capillary 148 (H) 65 - 99 mg/dL  Protime-INR     Status: None   Collection Time: 09/28/16  3:40 AM  Result Value Ref Range   Prothrombin Time 13.7 11.4 - 15.2 seconds   INR 1.05   CBC     Status: Abnormal   Collection Time: 09/28/16  3:40 AM  Result Value Ref Range   WBC 9.5 3.6 - 11.0 K/uL   RBC 3.24 (L) 3.80 - 5.20 MIL/uL   Hemoglobin 11.2 (L) 12.0 - 16.0 g/dL   HCT 34.0 (L) 35.0 - 47.0 %   MCV 104.9 (H) 80.0 - 100.0 fL   MCH 34.4 (H) 26.0 - 34.0 pg   MCHC 32.8 32.0 - 36.0 g/dL   RDW 18.2 (H) 11.5 - 14.5 %   Platelets 110 (L) 150 - 440  K/uL  Heparin level (unfractionated)     Status: Abnormal   Collection Time: 09/28/16  3:40 AM  Result Value Ref Range   Heparin Unfractionated 0.25 (L) 0.30 - 0.70 IU/mL    Comment:        IF HEPARIN RESULTS ARE BELOW EXPECTED VALUES, AND  PATIENT DOSAGE HAS BEEN CONFIRMED, SUGGEST FOLLOW UP TESTING OF ANTITHROMBIN III LEVELS.   Glucose, capillary     Status: Abnormal   Collection Time: 09/28/16  7:30 AM  Result Value Ref Range   Glucose-Capillary 157 (H) 65 - 99 mg/dL  Glucose, capillary     Status: Abnormal   Collection Time: 09/28/16 11:51 AM  Result Value Ref Range   Glucose-Capillary 200 (H) 65 - 99 mg/dL   Comment 1 Notify RN     Radiology Dg Knee 1-2 Views Right  Result Date: 09/28/2016 CLINICAL DATA:  Fracture. EXAM: RIGHT KNEE - 1-2 VIEW COMPARISON:  09/09/2016. FINDINGS: Total knee replacement again noted. Hardware intact. Displaced periprosthetic lateral femoral condyle fracture again noted. Displacement may have increased slightly appears to have increased from prior exam. Medial femoral condylar region difficult to image due to patient positioning. Peripheral vascular calcification . IMPRESSION: 1. Displaced periprosthetic lateral femoral condyle fracture again noted. Displacement may have increased slightly from prior exam . 2. Peripheral vascular disease. Electronically Signed   By: Marcello Moores  Register   On: 09/28/2016 07:02   US Venous Img Lower Bilateral  Result Date: 09/26/2016 CLINICAL DATA:  82 year old female with a history of pain and swelling bilateral legs. EXAM: BILATERAL LOWER EXTREMITY VENOUS DOPPLER ULTRASOUND TECHNIQUE: Gray-scale sonography with graded compression, as well as color Doppler and duplex ultrasound were performed to evaluate the lower extremity deep venous systems from the level of the common femoral vein and including the common femoral, femoral, profunda femoral, popliteal and calf veins including the posterior tibial, peroneal and gastrocnemius veins when visible. The superficial great saphenous vein was also interrogated. Spectral Doppler was utilized to evaluate flow at rest and with distal augmentation maneuvers in the common femoral, femoral and popliteal veins. COMPARISON:   09/11/2016 FINDINGS: RIGHT LOWER EXTREMITY Common Femoral Vein: No evidence of thrombus. Normal compressibility, respiratory phasicity and response to augmentation. Saphenofemoral Junction: No evidence of thrombus. Normal compressibility and flow on color Doppler imaging. Profunda Femoral Vein: No evidence of thrombus. Normal compressibility and flow on color Doppler imaging. Femoral Vein: Nonocclusive thrombus of the proximal right femoral vein, new from the prior. Popliteal Vein: No evidence of thrombus. Normal compressibility, respiratory phasicity and response to augmentation. Calf Veins: No evidence of thrombus. Normal compressibility and flow on color Doppler imaging. Superficial Great Saphenous Vein: No evidence of thrombus. Normal compressibility and flow on color Doppler imaging. Other Findings:  Edema of the right lower extremity. LEFT LOWER EXTREMITY Common Femoral Vein: No evidence of thrombus. Normal compressibility, respiratory phasicity and response to augmentation. Saphenofemoral Junction: No evidence of thrombus. Normal compressibility and flow on color Doppler imaging. Profunda Femoral Vein: No evidence of thrombus. Normal compressibility and flow on color Doppler imaging. Femoral Vein: No evidence of thrombus. Normal compressibility, respiratory phasicity and response to augmentation. Popliteal Vein: No evidence of thrombus. Normal compressibility, respiratory phasicity and response to augmentation. Calf Veins: No evidence of thrombus. Normal compressibility and flow on color Doppler imaging. Superficial Great Saphenous Vein: No evidence of thrombus. Normal compressibility and flow on color Doppler imaging. Other Findings: Heterogeneously echoic cystic structure in the left popliteal fossa measuring as large as 6 cm. Lower extremity  edema. IMPRESSION: Right: Survey is positive for nonocclusive DVT involving proximal right femoral vein, new from the comparison duplex survey. Edema Left: Survey of  the left lower extremity negative for DVT. Likely Baker cyst in the left popliteal fossa. Edema Technologist performing the exam will contact the ordering physician in the emergency department. Signed, Dulcy Fanny. Earleen Newport, DO [facility] Electronically Signed   By: Corrie Mckusick D.O.   On: 09/26/2016 14:03   Dg Chest Portable 1 View  Result Date: 09/26/2016 CLINICAL DATA:  Left leg pain and swelling today. EXAM: PORTABLE CHEST 1 VIEW COMPARISON:  09/19/2016 and prior exam FINDINGS: Cardiomegaly, right hemidiaphragm elevation and right basilar scarring again noted. There is no evidence of focal airspace disease, pulmonary edema, suspicious pulmonary nodule/mass, pleural effusion, or pneumothorax. No acute bony abnormalities are identified. IMPRESSION: Cardiomegaly without evidence of acute cardiopulmonary disease. Electronically Signed   By: Margarette Canada M.D.   On: 09/26/2016 12:11   Dg Chest Port 1 View  Result Date: 09/19/2016 CLINICAL DATA:  End-stage renal disease. Recent pneumonia. Hypertension. EXAM: PORTABLE CHEST 1 VIEW COMPARISON:  September 11, 2016 FINDINGS: There is patchy atelectasis in the right base, stable. No new opacity. There is cardiomegaly with mild pulmonary venous hypertension. No adenopathy. There is atherosclerotic calcification in the aorta. Central catheter has been removed. No pneumothorax. IMPRESSION: Stable atelectasis right base. No new opacity. Stable cardiomegaly with pulmonary venous hypertension indicative of a degree of pulmonary vascular congestion. Aortic atherosclerosis. No pneumothorax. Electronically Signed   By: Lowella Grip III M.D.   On: 09/19/2016 07:36     Assessment/Plan  ESRD on hemodialysis ([place]) Certainly contribute some lower extremity swelling. Has been getting dialysis almost every day.  DM (diabetes mellitus) blood glucose control important in reducing the progression of atherosclerotic  disease. Also, involved in wound healing. On appropriate medications.   Closed nondisplaced fracture of lateral condyle of right femur (HCC) Certainly worsens or lower extremity swelling and her immobility making the swelling worse. Going to see the orthopedic surgeon today and then should return for an Haematologist.  DVT (deep venous thrombosis) ([place]) The patient has what is likely subacute DVT in the right lower extremity. In some regards, this is likely associated with her fracture. She is on anticoagulation. She had a filter placed. At this point, her swelling is so severe S do think we need to wrap her legs with Unna boots. She has a filter in place for protection and anticoagulation. We will wrap these weekly, and recheck her in several weeks.  Swelling of limb The patient has what is likely subacute DVT in the right lower extremity. In some regards, this is likely associated with her fracture. She is on anticoagulation. She had a filter placed. At this point, her swelling is so severe S do think we need to wrap her legs with Unna boots. She has a filter in place for protection and anticoagulation. We will wrap these weekly, and recheck her in several weeks.    Leotis Pain, MD  10/12/2016 9:14 AM    This note was created with Dragon medical transcription system.  Any errors from dictation are purely unintentional

## 2016-10-15 ENCOUNTER — Telehealth (INDEPENDENT_AMBULATORY_CARE_PROVIDER_SITE_OTHER): Payer: Self-pay

## 2016-10-15 NOTE — Telephone Encounter (Signed)
[facility] nurse called to state that the patient is already under their care, and the patient has new wrap orders with a new home health agency to do her wraps and there should only be one? She wants the okay to cancel the order for [facility], and the ok for [facility] to do the wraps since they are already working with the patient originally.

## 2016-10-19 ENCOUNTER — Encounter (INDEPENDENT_AMBULATORY_CARE_PROVIDER_SITE_OTHER): Payer: Medicare Other

## 2016-10-26 ENCOUNTER — Encounter (INDEPENDENT_AMBULATORY_CARE_PROVIDER_SITE_OTHER): Payer: Medicare Other

## 2016-11-02 ENCOUNTER — Encounter (INDEPENDENT_AMBULATORY_CARE_PROVIDER_SITE_OTHER): Payer: Medicare Other

## 2016-11-03 ENCOUNTER — Telehealth (INDEPENDENT_AMBULATORY_CARE_PROVIDER_SITE_OTHER): Payer: Self-pay

## 2016-11-03 NOTE — Telephone Encounter (Signed)
Yes, please continue unna wraps but she should either see [place] or podiatry for the eschar that are present. Thanks!

## 2016-11-03 NOTE — Telephone Encounter (Signed)
Called and left a message for Ria Comment regarding the patient and her [employer], per [employer] the [employer]. They should be continued and the patient needs to be seen. See previous notes.

## 2016-11-03 NOTE — Telephone Encounter (Signed)
Ria Comment from [employer] called wanting to know if she should continue Unna boots, the patient's swelling has went down but she has lots of areas especially the heel and top of the feet that has eschar.  What to do

## 2016-11-09 ENCOUNTER — Ambulatory Visit (INDEPENDENT_AMBULATORY_CARE_PROVIDER_SITE_OTHER): Payer: Medicare Other | Admitting: Vascular Surgery

## 2016-11-16 ENCOUNTER — Encounter (INDEPENDENT_AMBULATORY_CARE_PROVIDER_SITE_OTHER): Payer: Self-pay

## 2016-11-16 ENCOUNTER — Ambulatory Visit (INDEPENDENT_AMBULATORY_CARE_PROVIDER_SITE_OTHER): Payer: Medicare Other | Admitting: Vascular Surgery

## 2016-11-16 ENCOUNTER — Encounter (INDEPENDENT_AMBULATORY_CARE_PROVIDER_SITE_OTHER): Payer: Self-pay | Admitting: Vascular Surgery

## 2016-11-16 ENCOUNTER — Other Ambulatory Visit (INDEPENDENT_AMBULATORY_CARE_PROVIDER_SITE_OTHER): Payer: Self-pay | Admitting: Vascular Surgery

## 2016-11-16 VITALS — BP 99/55 | HR 118 | Resp 16

## 2016-11-16 DIAGNOSIS — I890 Lymphedema, not elsewhere classified: Secondary | ICD-10-CM

## 2016-11-16 DIAGNOSIS — Z992 Dependence on renal dialysis: Secondary | ICD-10-CM | POA: Diagnosis not present

## 2016-11-16 DIAGNOSIS — N186 End stage renal disease: Secondary | ICD-10-CM

## 2016-11-16 DIAGNOSIS — Z794 Long term (current) use of insulin: Secondary | ICD-10-CM

## 2016-11-16 DIAGNOSIS — E119 Type 2 diabetes mellitus without complications: Secondary | ICD-10-CM

## 2016-11-16 NOTE — Progress Notes (Signed)
Subjective:    Patient ID: [name], female    DOB: 19-Aug-1934, 82 y.o.   MRN: [id number] Chief Complaint  Patient presents with  . Follow-up   Patient presents at the request of her nephrologist for a "poorly functioning" fistula. As per nephrologist, access is occluded at the venous site / arterial patent. Patient states her access is "not working right". The patient denies any fistula skin breakdown, pain, edema, pallor or ulceration of the arm / hand. The patient was also seen for her bilateral lower extremity edema with ankle ulceration. Unna boot do not seem to be placed correctly. They are being placed high enough on the leg and low enough toward the toes. Two shallow non-infected ulcerations noted on front of bilateral ankles. Patient denies any fever, nausea or vomiting.    Review of Systems  Constitutional: Negative.   HENT: Negative.   Eyes: Negative.   Respiratory: Negative.   Cardiovascular: Negative.   Gastrointestinal: Negative.   Endocrine: Negative.   Genitourinary:       Unable to dialyze adequately  Musculoskeletal: Negative.   Skin: Positive for wound (Bilateral Ankle Ulcerations).  Allergic/Immunologic: Negative.   Neurological: Negative.   Hematological: Negative.   Psychiatric/Behavioral: Negative.       Objective:   Physical Exam  Constitutional: She appears well-developed and well-nourished.  Obese. Wheelchair bound.   HENT:  Head: Normocephalic and atraumatic.  Right Ear: External ear normal.  Left Ear: External ear normal.  Eyes: Conjunctivae and EOM are normal. Pupils are equal, round, and reactive to light.  Neck: Normal range of motion.  Cardiovascular: Normal rate, regular rhythm and normal heart sounds.   Pulses:      Radial pulses are 2+ on the right side, and 2+ on the left side.  Hard to appreciate pedal pulses due to patients lymphedema and body habitus.  Pulmonary/Chest: Effort normal and breath sounds normal.  Abdominal: Soft.  Bowel sounds are normal.  Musculoskeletal: Normal range of motion. She exhibits edema (Moderate Bilateral Edema).  Neurological: She is alert.  Skin:  Two shallow ulcerations noted on front of ankle bilaterally.   Psychiatric: She has a normal mood and affect. Her behavior is normal. Judgment and thought content normal.   BP (!) 99/55   Pulse (!) 118   Resp 16   Past Medical History:  Diagnosis Date  . A-fib (HCC)    not on anticoagulation  . Anemia   . Bilateral lower extremity edema   . CHF (congestive heart failure) ([place])   . Diabetes mellitus without complication [facility])    Patient takes Insulin  . Dysrhythmia   . ESRD (end stage renal disease) ([place])    Monday, wednesday, Friday DIalysis  . Essential tremor   . HTN (hypertension)   . OSA (obstructive sleep apnea)   . Osteoarthritis   . Renal insufficiency    Patient is on dialysis and normal days are M,W and F.  . Skin cancer    Resected from legs   Social History   Social History  . Marital status: Married    Spouse name: N/A  . Number of children: N/A  . Years of education: N/A   Occupational History  . Not on file.   Social History Main Topics  . Smoking status: Never Smoker  . Smokeless tobacco: Never Used  . Alcohol use No  . Drug use: No  . Sexual activity: Not on file   Other Topics Concern  . Not on  file   Social History Narrative  . No narrative on file   Past Surgical History:  Procedure Laterality Date  . ABDOMINAL HYSTERECTOMY    . ANKLE FRACTURE SURGERY     right ankle  . CHOLECYSTECTOMY    . ESOPHAGOGASTRODUODENOSCOPY (EGD) WITH PROPOFOL N/A 10/09/2015   Procedure: ESOPHAGOGASTRODUODENOSCOPY (EGD) WITH PROPOFOL;  Surgeon: Hulen Luster, MD;  Location: [facility] ENDOSCOPY;  Service: Gastroenterology;  Laterality: N/A;  . FEMUR FRACTURE SURGERY     left femur  . KNEE ARTHROPLASTY    . NEUROPLASTY / TRANSPOSITION MEDIAN NERVE AT CARPAL TUNNEL BILATERAL    . PERIPHERAL VASCULAR CATHETERIZATION  Left 08/06/2015   Procedure: A/V Shuntogram/Fistulagram;  Surgeon: Algernon Huxley, MD;  Location: [place] CV LAB;  Service: Cardiovascular;  Laterality: Left;  . PERIPHERAL VASCULAR CATHETERIZATION Left 11/20/2015   Procedure: A/V Shuntogram/Fistulagram;  Surgeon: Algernon Huxley, MD;  Location: [place] CV LAB;  Service: Cardiovascular;  Laterality: Left;  . PERIPHERAL VASCULAR CATHETERIZATION N/A 11/20/2015   Procedure: A/V Shunt Intervention;  Surgeon: Algernon Huxley, MD;  Location: [place] CV LAB;  Service: Cardiovascular;  Laterality: N/A;  . PERIPHERAL VASCULAR CATHETERIZATION N/A 09/27/2016   Procedure: IVC Filter Insertion;  Surgeon: Algernon Huxley, MD;  Location: [place] CV LAB;  Service: Cardiovascular;  Laterality: N/A;  . TUBAL LIGATION     Family History  Problem Relation Age of Onset  . Diabetes type II Father   . Breast cancer Sister 83  . Breast cancer Maternal Grandmother    Allergies  Allergen Reactions  . Angiotensin Receptor Blockers Other (See Comments)    Reaction:  Hyperkalemia  . Penicillins Rash and Other (See Comments)    rash      Assessment & Plan:  Patient presents at the request of her nephrologist for a "poorly functioning" fistula. As per nephrologist, access is occluded at the venous site / arterial patent. Patient states her access is "not working right". The patient denies any fistula skin breakdown, pain, edema, pallor or ulceration of the arm / hand. The patient was also seen for her bilateral lower extremity edema with ankle ulceration. Unna boot do not seem to be placed correctly. They are being placed high enough on the leg and low enough toward the toes. Two shallow non-infected ulcerations noted on front of bilateral ankles. Patient denies any fever, nausea or vomiting.   1. ESRD on hemodialysis ([place]) - Worsening Patient unable to dialyze adequately. Recommend LUE fistulogram in an attempt to restore function to fistula. Procedure, risks  and benefits explained to patient and husband. All questions answered.Patient is willing to proceed.  2. Lymphedema - Stable Patient receives home nursing services for bilateral unna boot changes. Continue three times a week zinc oxide unna boots to the bilateral lower extremity.  Follow up in one month for wound check  3. Type 2 diabetes mellitus without complication, with long-term current use of insulin (HCC) - Stable Encouraged good control as its slows the progression of atherosclerotic disease  Current Outpatient Prescriptions on File Prior to Visit  Medication Sig Dispense Refill  . allopurinol (ZYLOPRIM) 100 MG tablet Take 100 mg by mouth daily.     Marland Kitchen docusate sodium (COLACE) 100 MG capsule Take 1 capsule (100 mg total) by mouth 2 (two) times daily. 10 capsule 0  . fluticasone (FLONASE) 50 MCG/ACT nasal spray Place 2 sprays into both nostrils at bedtime.    . insulin glargine (LANTUS) 100 UNIT/ML injection Inject 0.08 mLs (  8 Units total) into the skin at bedtime. 10 mL 11  . lidocaine-prilocaine (EMLA) cream Apply 1 application topically as needed. Apply to access site (left forearm) 1 hour prior to dialysis and cover with clear wrap. Mon, wed, fri 0500    . midodrine (PROAMATINE) 10 MG tablet Take 1 tablet (10 mg total) by mouth 3 (three) times daily with meals. (Patient taking differently: Take 10 mg by mouth 3 (three) times daily with meals. Sun, tue, thu, sat - 0900,1800,2100) 90 tablet 2  . Multiple Vitamin (MULTIVITAMIN WITH MINERALS) TABS tablet Take 1 tablet by mouth daily.    Marland Kitchen nystatin cream (MYCOSTATIN) Apply 1 application topically 2 (two) times daily as needed (for itching).     . pantoprazole (PROTONIX) 40 MG tablet Take 40 mg by mouth daily.    . polyethylene glycol (MIRALAX / GLYCOLAX) packet Take 17 g by mouth daily. In 4-8oz fluid of choice    . promethazine (PHENERGAN) 25 MG tablet Take 25 mg by mouth every 6 (six) hours as needed for nausea or vomiting.     Marland Kitchen  RENVELA 800 MG tablet Take 800-1,600 mg by mouth 5 (five) times daily. Pt takes two tablets three times daily with meals and one tablet two times daily with snacks.    . traMADol (ULTRAM) 50 MG tablet Take 1 tablet (50 mg total) by mouth every 6 (six) hours as needed for moderate pain or severe pain. 30 tablet 0  . apixaban (ELIQUIS) 5 MG TABS tablet Take 1 tablet (5 mg total) by mouth 2 (two) times daily. 60 tablet 1  . calcium acetate (PHOSLO) 667 MG capsule Take 1 capsule (667 mg total) by mouth 2 (two) times daily between meals. (Patient not taking: Reported on 11/16/2016) 60 capsule 0  . cholecalciferol (VITAMIN D) 1000 units tablet Take 1,000 Units by mouth daily.    Marland Kitchen guaiFENesin (MUCINEX) 600 MG 12 hr tablet Take 1 tablet (600 mg total) by mouth 2 (two) times daily. (Patient not taking: Reported on 11/16/2016) 20 tablet 0  . ipratropium-albuterol (DUONEB) 0.5-2.5 (3) MG/3ML SOLN Take 3 mLs by nebulization 4 (four) times daily as needed (for wheezing/shortness of breath).    . propranolol (INDERAL) 20 MG tablet Take 1 tablet (20 mg total) by mouth 2 (two) times daily. (Patient not taking: Reported on 11/16/2016) 60 tablet 2  . QUEtiapine (SEROQUEL) 25 MG tablet Take 1 tablet (25 mg total) by mouth at bedtime. (Patient not taking: Reported on 11/16/2016) 30 tablet 2  . senna (SENOKOT) 8.6 MG TABS tablet Take 2 tablets (17.2 mg total) by mouth daily as needed for mild constipation. (Patient not taking: Reported on 11/16/2016) 30 each 0   No current facility-administered medications on file prior to visit.     There are no Patient Instructions on file for this visit. No Follow-up on file.   Shriley Joffe A Wynston Romey, PA-C

## 2016-11-18 ENCOUNTER — Encounter: Admission: RE | Payer: Self-pay | Source: Ambulatory Visit

## 2016-11-18 ENCOUNTER — Ambulatory Visit: Admission: RE | Admit: 2016-11-18 | Payer: Medicare Other | Source: Ambulatory Visit | Admitting: Vascular Surgery

## 2016-11-18 SURGERY — A/V FISTULAGRAM
Anesthesia: Moderate Sedation | Laterality: Left

## 2016-12-03 ENCOUNTER — Telehealth (INDEPENDENT_AMBULATORY_CARE_PROVIDER_SITE_OTHER): Payer: Self-pay

## 2016-12-03 NOTE — Telephone Encounter (Signed)
[name] from [employer] called stating the patient has Unna boots on but when she went to change them she had black scabbing on her right leg and foot that is no draining yellow, with a foul odor. She has not put the Unna boots back on as of yet because she was wondering if the patient needs to go to the ED to be evaluated.

## 2016-12-03 NOTE — Telephone Encounter (Signed)
I called Larena Glassman and gave her the information regarding the patient. Per Dr. Lucky Cowboy if she is having a fever and otherwise feeling bad then she needs to go to the ER , if not then replace the Unna boot and we will have her come in next week to be seen.

## 2016-12-08 ENCOUNTER — Telehealth (INDEPENDENT_AMBULATORY_CARE_PROVIDER_SITE_OTHER): Payer: Self-pay

## 2016-12-14 ENCOUNTER — Ambulatory Visit (INDEPENDENT_AMBULATORY_CARE_PROVIDER_SITE_OTHER): Payer: Medicare Other | Admitting: Vascular Surgery

## 2016-12-14 ENCOUNTER — Encounter (INDEPENDENT_AMBULATORY_CARE_PROVIDER_SITE_OTHER): Payer: Self-pay | Admitting: Vascular Surgery

## 2016-12-14 VITALS — BP 88/53 | HR 109 | Resp 16 | Ht 62.0 in | Wt 205.0 lb

## 2016-12-14 DIAGNOSIS — I481 Persistent atrial fibrillation: Secondary | ICD-10-CM | POA: Diagnosis not present

## 2016-12-14 DIAGNOSIS — L98499 Non-pressure chronic ulcer of skin of other sites with unspecified severity: Secondary | ICD-10-CM

## 2016-12-14 DIAGNOSIS — I890 Lymphedema, not elsewhere classified: Secondary | ICD-10-CM

## 2016-12-14 DIAGNOSIS — Z794 Long term (current) use of insulin: Secondary | ICD-10-CM

## 2016-12-14 DIAGNOSIS — IMO0002 Reserved for concepts with insufficient information to code with codable children: Secondary | ICD-10-CM

## 2016-12-14 DIAGNOSIS — N186 End stage renal disease: Secondary | ICD-10-CM | POA: Diagnosis not present

## 2016-12-14 DIAGNOSIS — Z992 Dependence on renal dialysis: Secondary | ICD-10-CM

## 2016-12-14 DIAGNOSIS — I4819 Other persistent atrial fibrillation: Secondary | ICD-10-CM

## 2016-12-14 DIAGNOSIS — E119 Type 2 diabetes mellitus without complications: Secondary | ICD-10-CM

## 2016-12-14 NOTE — Assessment & Plan Note (Signed)
Renal failure certainly worsens his wound healing potential. If possible, removal of more fluid would be of great benefit to her lower extremity swelling.

## 2016-12-14 NOTE — Assessment & Plan Note (Signed)
The patient has multiple ulcerations of both lower extremities including bilateral heel ulcers, bilateral foot ulcers, and a large right calf ulceration. All her markedly worsened by her severe swelling and I think putting her in Unna boots today will be of some help. We will also assess her for arterial insufficiency given her large number of atherosclerotic risk factors. This is a very severe and limb threatening problem and I had a long discussion today with the patient and her husband regarding the fact that limb loss is certainly possible. I will see her back following her noninvasive studies.

## 2016-12-14 NOTE — Assessment & Plan Note (Signed)
Her legs are very largely swollen bilaterally with severe ulcerations.

## 2016-12-14 NOTE — Assessment & Plan Note (Signed)
Certainly worsens for wound healing situation. I think it's very important at this point we'll also assess her perfusion. Glucose control very important for wound healing as well as reduction of progression of atherosclerosis.

## 2016-12-14 NOTE — Progress Notes (Signed)
MRN : [id number]  [name] is a 83 y.o. ([birth year]) female who presents with chief complaint of  Chief Complaint  Patient presents with  . Re-evaluation    Unna boot check  .  History of Present Illness: Patient returns today in follow up of Bilateral lower extremity ulcerations. She has developed heel ulcerations, foot ulcerations, and a right posterior calf ulceration with necrotic eschar. She has lots of pain in her legs. Her lower extremity swelling is really pretty massive at this point. She is quite immobile after her knee fracture on the right and her debilitated state. She was critically ill and spent a long time and hospital earlier this year, and has never really recovered. She denies fevers or chills or signs systemic infection. They have not been doing that in Unna boots at home. Her husband reports that they have been unable to take off much fluid secondary to low blood pressure with dialysis. This has resulted in massive fluid buildup. Her primary care physician tried a diuretic but she really does not make that much urine.  Current Outpatient Prescriptions  Medication Sig Dispense Refill  . allopurinol (ZYLOPRIM) 100 MG tablet Take 100 mg by mouth daily.     Marland Kitchen amiodarone (PACERONE) 100 MG tablet Take 100 mg by mouth daily.    Marland Kitchen apixaban (ELIQUIS) 5 MG TABS tablet Take 1 tablet (5 mg total) by mouth 2 (two) times daily. 60 tablet 1  . benzonatate (TESSALON) 100 MG capsule Take by mouth 3 (three) times daily as needed for cough.    . calcium acetate (PHOSLO) 667 MG capsule Take 1 capsule (667 mg total) by mouth 2 (two) times daily between meals. (Patient not taking: Reported on 11/16/2016) 60 capsule 0  . cholecalciferol (VITAMIN D) 1000 units tablet Take 1,000 Units by mouth daily.    Marland Kitchen dextromethorphan (DELSYM COUGH CHILDRENS) 30 MG/5ML liquid Take by mouth as needed for cough.    . diltiazem (CARDIZEM CD) 180 MG 24 hr capsule Take 180 mg by mouth daily.    Marland Kitchen docusate  sodium (COLACE) 100 MG capsule Take 1 capsule (100 mg total) by mouth 2 (two) times daily. 10 capsule 0  . fluticasone (FLONASE) 50 MCG/ACT nasal spray Place 2 sprays into both nostrils at bedtime.    . folic acid-vitamin b complex-vitamin c-selenium-zinc (DIALYVITE) 3 MG TABS tablet Take 1 tablet by mouth daily.    Marland Kitchen guaiFENesin (MUCINEX) 600 MG 12 hr tablet Take 1 tablet (600 mg total) by mouth 2 (two) times daily. (Patient not taking: Reported on 11/16/2016) 20 tablet 0  . insulin glargine (LANTUS) 100 UNIT/ML injection Inject 0.08 mLs (8 Units total) into the skin at bedtime. 10 mL 11  . ipratropium-albuterol (DUONEB) 0.5-2.5 (3) MG/3ML SOLN Take 3 mLs by nebulization 4 (four) times daily as needed (for wheezing/shortness of breath).    . lidocaine-prilocaine (EMLA) cream Apply 1 application topically as needed. Apply to access site (left forearm) 1 hour prior to dialysis and cover with clear wrap. Mon, wed, fri 0500    . meclizine (ANTIVERT) 32 MG tablet Take 32 mg by mouth 3 (three) times daily as needed.    . midodrine (PROAMATINE) 10 MG tablet Take 1 tablet (10 mg total) by mouth 3 (three) times daily with meals. (Patient taking differently: Take 10 mg by mouth 3 (three) times daily with meals. Sun, tue, thu, sat - 0900,1800,2100) 90 tablet 2  . mirtazapine (REMERON) 7.5 MG tablet Take 7.5 mg by  mouth at bedtime.    . Multiple Vitamin (MULTIVITAMIN WITH MINERALS) TABS tablet Take 1 tablet by mouth daily.    Marland Kitchen nystatin cream (MYCOSTATIN) Apply 1 application topically 2 (two) times daily as needed (for itching).     . ondansetron (ZOFRAN) 4 MG tablet Take 4 mg by mouth every 8 (eight) hours as needed for nausea or vomiting.    . pantoprazole (PROTONIX) 40 MG tablet Take 40 mg by mouth daily.    . polyethylene glycol (MIRALAX / GLYCOLAX) packet Take 17 g by mouth daily. In 4-8oz fluid of choice    . promethazine (PHENERGAN) 25 MG tablet Take 25 mg by mouth every 6 (six) hours as needed for nausea  or vomiting.     . propranolol (INDERAL) 20 MG tablet Take 1 tablet (20 mg total) by mouth 2 (two) times daily. (Patient not taking: Reported on 11/16/2016) 60 tablet 2  . QUEtiapine (SEROQUEL) 25 MG tablet Take 1 tablet (25 mg total) by mouth at bedtime. (Patient not taking: Reported on 11/16/2016) 30 tablet 2  . ranitidine (ZANTAC) 150 MG capsule Take 150 mg by mouth 2 (two) times daily.    Marland Kitchen RENVELA 800 MG tablet Take 800-1,600 mg by mouth 5 (five) times daily. Pt takes two tablets three times daily with meals and one tablet two times daily with snacks.    . senna (SENOKOT) 8.6 MG TABS tablet Take 2 tablets (17.2 mg total) by mouth daily as needed for mild constipation. (Patient not taking: Reported on 11/16/2016) 30 each 0  . traMADol (ULTRAM) 50 MG tablet Take 1 tablet (50 mg total) by mouth every 6 (six) hours as needed for moderate pain or severe pain. 30 tablet 0   No current facility-administered medications for this visit.     Past Medical History:  Diagnosis Date  . A-fib (HCC)    not on anticoagulation  . Anemia   . Bilateral lower extremity edema   . CHF (congestive heart failure) ([place])   . Diabetes mellitus without complication [facility])    Patient takes Insulin  . Dysrhythmia   . ESRD (end stage renal disease) ([place])    Monday, wednesday, Friday DIalysis  . Essential tremor   . HTN (hypertension)   . OSA (obstructive sleep apnea)   . Osteoarthritis   . Renal insufficiency    Patient is on dialysis and normal days are M,W and F.  . Skin cancer    Resected from legs    Past Surgical History:  Procedure Laterality Date  . ABDOMINAL HYSTERECTOMY    . ANKLE FRACTURE SURGERY     right ankle  . CHOLECYSTECTOMY    . ESOPHAGOGASTRODUODENOSCOPY (EGD) WITH PROPOFOL N/A 10/09/2015   Procedure: ESOPHAGOGASTRODUODENOSCOPY (EGD) WITH PROPOFOL;  Surgeon: Hulen Luster, MD;  Location: [facility] ENDOSCOPY;  Service: Gastroenterology;  Laterality: N/A;  . FEMUR FRACTURE SURGERY     left femur    . KNEE ARTHROPLASTY    . NEUROPLASTY / TRANSPOSITION MEDIAN NERVE AT CARPAL TUNNEL BILATERAL    . PERIPHERAL VASCULAR CATHETERIZATION Left 08/06/2015   Procedure: A/V Shuntogram/Fistulagram;  Surgeon: Algernon Huxley, MD;  Location: [place] CV LAB;  Service: Cardiovascular;  Laterality: Left;  . PERIPHERAL VASCULAR CATHETERIZATION Left 11/20/2015   Procedure: A/V Shuntogram/Fistulagram;  Surgeon: Algernon Huxley, MD;  Location: [place] CV LAB;  Service: Cardiovascular;  Laterality: Left;  . PERIPHERAL VASCULAR CATHETERIZATION N/A 11/20/2015   Procedure: A/V Shunt Intervention;  Surgeon: Algernon Huxley, MD;  Location:  [place] CV LAB;  Service: Cardiovascular;  Laterality: N/A;  . PERIPHERAL VASCULAR CATHETERIZATION N/A 09/27/2016   Procedure: IVC Filter Insertion;  Surgeon: Algernon Huxley, MD;  Location: [place] CV LAB;  Service: Cardiovascular;  Laterality: N/A;  . TUBAL LIGATION      Social History Social History  Substance Use Topics  . Smoking status: Never Smoker  . Smokeless tobacco: Never Used  . Alcohol use No  Married, husband accompanies her today  Family History Family History  Problem Relation Age of Onset  . Diabetes type II Father   . Breast cancer Sister 63  . Breast cancer Maternal Grandmother   No bleeding disorders.  Allergies  Allergen Reactions  . Angiotensin Receptor Blockers Other (See Comments)    Reaction:  Hyperkalemia  . Penicillins Rash and Other (See Comments)    rash     REVIEW OF SYSTEMS (Negative unless checked)  Constitutional: [] Weight loss  [] Fever  [] Chills Cardiac: [] Chest pain   [] Chest pressure   [] Palpitations   [] Shortness of breath when laying flat   [] Shortness of breath at rest   [] Shortness of breath with exertion. Vascular:  [] Pain in legs with walking   [] Pain in legs at rest   [] Pain in legs when laying flat   [] Claudication   [] Pain in feet when walking  [] Pain in feet at rest  [] Pain in feet when laying flat    [] History of DVT   [] Phlebitis   [x] Swelling in legs   [] Varicose veins   [x] Non-healing ulcers Pulmonary:   [] Uses home oxygen   [] Productive cough   [] Hemoptysis   [] Wheeze  [] COPD   [] Asthma Neurologic:  [] Dizziness  [] Blackouts   [] Seizures   [x] History of stroke   [] History of TIA  [] Aphasia   [] Temporary blindness   [] Dysphagia   [] Weakness or numbness in arms   [] Weakness or numbness in legs Musculoskeletal:  [] Arthritis   [] Joint swelling   [x] Joint pain   [] Low back pain Hematologic:  [] Easy bruising  [] Easy bleeding   [] Hypercoagulable state   [] Anemic   Gastrointestinal:  [] Blood in stool   [] Vomiting blood  [] Gastroesophageal reflux/heartburn   [] Abdominal pain Genitourinary:  [x] Chronic kidney disease   [] Difficult urination  [] Frequent urination  [] Burning with urination   [] Hematuria Skin:  [] Rashes   [x] Ulcers   [x] Wounds Psychological:  [] History of anxiety   []  History of major depression.  Physical Examination  BP (!) 88/53 (BP Location: Right Arm)   Pulse (!) 109   Resp 16   Ht 5' 2"  (1.575 m)   Wt 205 lb (93 kg)   BMI 37.49 kg/m  Gen:  WD/WN, NAD Head: [place]/AT, No temporalis wasting. Ear/Nose/Throat: Hearing grossly intact, nares w/o erythema or drainage, trachea midline Eyes: Conjunctiva clear. Sclera non-icteric Neck: Supple.  No JVD.  Pulmonary:  Good air movement, no use of accessory muscles.  Cardiac: Irregularly irregular Vascular: Thrill and bruit are present in left arm AV fistula Vessel Right Left  Radial Palpable Palpable  Ulnar Palpable Palpable  Brachial Palpable Palpable  Carotid Palpable, without bruit Palpable, without bruit  Aorta Not palpable N/A  Femoral Palpable Palpable  Popliteal Not Palpable Not Palpable  PT Not Palpable Not Palpable  DP Not Palpable Not Palpable   Gastrointestinal: soft, non-tender/non-distended. No guarding/reflex.  Musculoskeletal:  4+, massive bilateral lower extremity edema. Wheelchair-bound Neurologic: Sensation  grossly intact in extremities.  Symmetrical.  Speech is fluent.  Psychiatric: Judgment intact, Mood & affect appropriate for pt's  clinical situation. Dermatologic: The patient has large ulcerations with necrotic eschar and fibrinous exudate on both heels, both anterior ankle and top of the foot areas, and the right posterior lateral calf area. All are reasonably large greater than 5 cm. None have any granulation tissue to speak of. Lymph : No Cervical, Axillary, or Inguinal lymphadenopathy.      Labs Recent Results (from the past 2160 hour(s))  Glucose, capillary     Status: Abnormal   Collection Time: 09/15/16 11:42 AM  Result Value Ref Range   Glucose-Capillary 194 (H) 65 - 99 mg/dL  Glucose, capillary     Status: Abnormal   Collection Time: 09/15/16  3:42 PM  Result Value Ref Range   Glucose-Capillary 164 (H) 65 - 99 mg/dL  Glucose, capillary     Status: Abnormal   Collection Time: 09/15/16  9:29 PM  Result Value Ref Range   Glucose-Capillary 206 (H) 65 - 99 mg/dL   Comment 1 Notify RN   Glucose, capillary     Status: Abnormal   Collection Time: 09/16/16  7:27 AM  Result Value Ref Range   Glucose-Capillary 132 (H) 65 - 99 mg/dL  Glucose, capillary     Status: Abnormal   Collection Time: 09/16/16 11:33 AM  Result Value Ref Range   Glucose-Capillary 182 (H) 65 - 99 mg/dL  Vancomycin, random     Status: None   Collection Time: 09/16/16  3:00 PM  Result Value Ref Range   Vancomycin Rm 24     Comment:        Random Vancomycin therapeutic range is dependent on dosage and time of specimen collection. A peak range is 20.0-40.0 ug/mL A trough range is 5.0-15.0 ug/mL          Glucose, capillary     Status: Abnormal   Collection Time: 09/16/16  5:29 PM  Result Value Ref Range   Glucose-Capillary 134 (H) 65 - 99 mg/dL  Glucose, capillary     Status: Abnormal   Collection Time: 09/16/16  9:06 PM  Result Value Ref Range   Glucose-Capillary 156 (H) 65 - 99 mg/dL   Comment 1  Notify RN   CBC     Status: Abnormal   Collection Time: 09/17/16  3:23 AM  Result Value Ref Range   WBC 12.8 (H) 3.6 - 11.0 K/uL   RBC 3.16 (L) 3.80 - 5.20 MIL/uL   Hemoglobin 10.9 (L) 12.0 - 16.0 g/dL   HCT 32.2 (L) 35.0 - 47.0 %   MCV 102.1 (H) 80.0 - 100.0 fL   MCH 34.6 (H) 26.0 - 34.0 pg   MCHC 33.9 32.0 - 36.0 g/dL   RDW 16.7 (H) 11.5 - 14.5 %   Platelets 82 (L) 150 - 440 K/uL  Glucose, capillary     Status: Abnormal   Collection Time: 09/17/16  7:50 AM  Result Value Ref Range   Glucose-Capillary 173 (H) 65 - 99 mg/dL   Comment 1 Notify RN   Glucose, capillary     Status: Abnormal   Collection Time: 09/17/16 11:08 AM  Result Value Ref Range   Glucose-Capillary 230 (H) 65 - 99 mg/dL   Comment 1 Notify RN   Glucose, capillary     Status: Abnormal   Collection Time: 09/17/16  5:27 PM  Result Value Ref Range   Glucose-Capillary 189 (H) 65 - 99 mg/dL   Comment 1 Notify RN   Glucose, capillary     Status: Abnormal   Collection Time: 09/17/16  9:19 PM  Result Value Ref Range   Glucose-Capillary 226 (H) 65 - 99 mg/dL  CBC     Status: Abnormal   Collection Time: 09/18/16  3:24 AM  Result Value Ref Range   WBC 13.5 (H) 3.6 - 11.0 K/uL   RBC 3.06 (L) 3.80 - 5.20 MIL/uL   Hemoglobin 10.3 (L) 12.0 - 16.0 g/dL   HCT 31.8 (L) 35.0 - 47.0 %   MCV 103.8 (H) 80.0 - 100.0 fL   MCH 33.7 26.0 - 34.0 pg   MCHC 32.5 32.0 - 36.0 g/dL   RDW 17.9 (H) 11.5 - 14.5 %   Platelets 74 (L) 150 - 440 K/uL  Basic metabolic panel     Status: Abnormal   Collection Time: 09/18/16  3:24 AM  Result Value Ref Range   Sodium 136 135 - 145 mmol/L   Potassium 3.9 3.5 - 5.1 mmol/L   Chloride 97 (L) 101 - 111 mmol/L   CO2 31 22 - 32 mmol/L   Glucose, Bld 234 (H) 65 - 99 mg/dL   BUN 28 (H) 6 - 20 mg/dL   Creatinine, Ser 4.05 (H) 0.44 - 1.00 mg/dL   Calcium 8.3 (L) 8.9 - 10.3 mg/dL   GFR calc non Af Amer 10 (L) >60 mL/min   GFR calc Af Amer 11 (L) >60 mL/min    Comment: (NOTE) The eGFR has been  calculated using the CKD EPI equation. This calculation has not been validated in all clinical situations. eGFR's persistently <60 mL/min signify possible Chronic Kidney Disease.    Anion gap 8 5 - 15  Glucose, capillary     Status: Abnormal   Collection Time: 09/18/16  7:48 AM  Result Value Ref Range   Glucose-Capillary 215 (H) 65 - 99 mg/dL   Comment 1 Notify RN   Glucose, capillary     Status: Abnormal   Collection Time: 09/18/16 11:27 AM  Result Value Ref Range   Glucose-Capillary 232 (H) 65 - 99 mg/dL   Comment 1 Notify RN   Renal function panel     Status: Abnormal   Collection Time: 09/18/16  3:40 PM  Result Value Ref Range   Sodium 133 (L) 135 - 145 mmol/L   Potassium 4.1 3.5 - 5.1 mmol/L   Chloride 96 (L) 101 - 111 mmol/L   CO2 29 22 - 32 mmol/L   Glucose, Bld 279 (H) 65 - 99 mg/dL   BUN 30 (H) 6 - 20 mg/dL   Creatinine, Ser 4.47 (H) 0.44 - 1.00 mg/dL   Calcium 8.2 (L) 8.9 - 10.3 mg/dL   Phosphorus 2.2 (L) 2.5 - 4.6 mg/dL   Albumin 2.7 (L) 3.5 - 5.0 g/dL   GFR calc non Af Amer 8 (L) >60 mL/min   GFR calc Af Amer 10 (L) >60 mL/min    Comment: (NOTE) The eGFR has been calculated using the CKD EPI equation. This calculation has not been validated in all clinical situations. eGFR's persistently <60 mL/min signify possible Chronic Kidney Disease.    Anion gap 8 5 - 15  CBC     Status: Abnormal   Collection Time: 09/18/16  3:40 PM  Result Value Ref Range   WBC 14.2 (H) 3.6 - 11.0 K/uL   RBC 3.07 (L) 3.80 - 5.20 MIL/uL   Hemoglobin 10.5 (L) 12.0 - 16.0 g/dL   HCT 31.6 (L) 35.0 - 47.0 %   MCV 103.0 (H) 80.0 - 100.0 fL   MCH 34.1 (H) 26.0 -  34.0 pg   MCHC 33.1 32.0 - 36.0 g/dL   RDW 17.6 (H) 11.5 - 14.5 %   Platelets 76 (L) 150 - 440 K/uL  Glucose, capillary     Status: Abnormal   Collection Time: 09/18/16  9:44 PM  Result Value Ref Range   Glucose-Capillary 198 (H) 65 - 99 mg/dL   Comment 1 Notify RN   Glucose, capillary     Status: Abnormal   Collection Time:  09/19/16  7:34 AM  Result Value Ref Range   Glucose-Capillary 171 (H) 65 - 99 mg/dL   Comment 1 Notify RN   Glucose, capillary     Status: Abnormal   Collection Time: 09/19/16 11:16 AM  Result Value Ref Range   Glucose-Capillary 247 (H) 65 - 99 mg/dL   Comment 1 Notify RN   Glucose, capillary     Status: Abnormal   Collection Time: 09/19/16  4:32 PM  Result Value Ref Range   Glucose-Capillary 252 (H) 65 - 99 mg/dL   Comment 1 Notify RN   Glucose, capillary     Status: Abnormal   Collection Time: 09/19/16  9:51 PM  Result Value Ref Range   Glucose-Capillary 205 (H) 65 - 99 mg/dL   Comment 1 Notify RN   Basic metabolic panel     Status: Abnormal   Collection Time: 09/20/16  3:30 AM  Result Value Ref Range   Sodium 133 (L) 135 - 145 mmol/L   Potassium 4.3 3.5 - 5.1 mmol/L   Chloride 95 (L) 101 - 111 mmol/L   CO2 30 22 - 32 mmol/L   Glucose, Bld 155 (H) 65 - 99 mg/dL   BUN 22 (H) 6 - 20 mg/dL   Creatinine, Ser 3.63 (H) 0.44 - 1.00 mg/dL   Calcium 8.4 (L) 8.9 - 10.3 mg/dL   GFR calc non Af Amer 11 (L) >60 mL/min   GFR calc Af Amer 13 (L) >60 mL/min    Comment: (NOTE) The eGFR has been calculated using the CKD EPI equation. This calculation has not been validated in all clinical situations. eGFR's persistently <60 mL/min signify possible Chronic Kidney Disease.    Anion gap 8 5 - 15  Glucose, capillary     Status: Abnormal   Collection Time: 09/20/16  8:35 AM  Result Value Ref Range   Glucose-Capillary 157 (H) 65 - 99 mg/dL  Glucose, capillary     Status: Abnormal   Collection Time: 09/20/16 11:39 AM  Result Value Ref Range   Glucose-Capillary 247 (H) 65 - 99 mg/dL   Comment 1 Notify RN   CBC     Status: Abnormal   Collection Time: 09/20/16  2:08 PM  Result Value Ref Range   WBC 12.9 (H) 3.6 - 11.0 K/uL   RBC 3.13 (L) 3.80 - 5.20 MIL/uL   Hemoglobin 10.6 (L) 12.0 - 16.0 g/dL   HCT 32.6 (L) 35.0 - 47.0 %   MCV 104.2 (H) 80.0 - 100.0 fL   MCH 33.9 26.0 - 34.0 pg    MCHC 32.5 32.0 - 36.0 g/dL   RDW 18.9 (H) 11.5 - 14.5 %   Platelets 65 (L) 150 - 440 K/uL  Glucose, capillary     Status: Abnormal   Collection Time: 09/20/16  4:41 PM  Result Value Ref Range   Glucose-Capillary 215 (H) 65 - 99 mg/dL   Comment 1 Notify RN   Glucose, capillary     Status: Abnormal   Collection Time: 09/20/16  9:04  PM  Result Value Ref Range   Glucose-Capillary 125 (H) 65 - 99 mg/dL   Comment 1 Notify RN   Glucose, capillary     Status: Abnormal   Collection Time: 09/21/16  7:13 AM  Result Value Ref Range   Glucose-Capillary 148 (H) 65 - 99 mg/dL  Glucose, capillary     Status: Abnormal   Collection Time: 09/21/16 11:39 AM  Result Value Ref Range   Glucose-Capillary 236 (H) 65 - 99 mg/dL   Comment 1 Notify RN   Comprehensive metabolic panel     Status: Abnormal   Collection Time: 09/26/16 11:48 AM  Result Value Ref Range   Sodium 130 (L) 135 - 145 mmol/L   Potassium 3.6 3.5 - 5.1 mmol/L   Chloride 91 (L) 101 - 111 mmol/L   CO2 33 (H) 22 - 32 mmol/L   Glucose, Bld 209 (H) 65 - 99 mg/dL   BUN 24 (H) 6 - 20 mg/dL   Creatinine, Ser 3.39 (H) 0.44 - 1.00 mg/dL   Calcium 8.8 (L) 8.9 - 10.3 mg/dL   Total Protein 5.8 (L) 6.5 - 8.1 g/dL   Albumin 2.8 (L) 3.5 - 5.0 g/dL   AST 24 15 - 41 U/L   ALT 12 (L) 14 - 54 U/L   Alkaline Phosphatase 162 (H) 38 - 126 U/L   Total Bilirubin 1.5 (H) 0.3 - 1.2 mg/dL   GFR calc non Af Amer 12 (L) >60 mL/min   GFR calc Af Amer 14 (L) >60 mL/min    Comment: (NOTE) The eGFR has been calculated using the CKD EPI equation. This calculation has not been validated in all clinical situations. eGFR's persistently <60 mL/min signify possible Chronic Kidney Disease.    Anion gap 6 5 - 15  Troponin I     Status: Abnormal   Collection Time: 09/26/16 11:48 AM  Result Value Ref Range   Troponin I 0.07 (HH) <0.03 ng/mL    Comment: CRITICAL RESULT CALLED TO, READ BACK BY AND VERIFIED WITH Khamora TEAGUE @ 1304 09/26/16 BY [facility]   Brain natriuretic  peptide     Status: Abnormal   Collection Time: 09/26/16 11:48 AM  Result Value Ref Range   B Natriuretic Peptide 663.0 (H) 0.0 - 100.0 pg/mL  CBC with Differential     Status: Abnormal   Collection Time: 09/26/16 11:48 AM  Result Value Ref Range   WBC 8.5 3.6 - 11.0 K/uL   RBC 3.32 (L) 3.80 - 5.20 MIL/uL   Hemoglobin 11.3 (L) 12.0 - 16.0 g/dL   HCT 34.3 (L) 35.0 - 47.0 %   MCV 103.5 (H) 80.0 - 100.0 fL   MCH 34.0 26.0 - 34.0 pg   MCHC 32.8 32.0 - 36.0 g/dL   RDW 18.5 (H) 11.5 - 14.5 %   Platelets 117 (L) 150 - 440 K/uL   Neutrophils Relative % 63 %   Neutro Abs 5.3 1.4 - 6.5 K/uL   Lymphocytes Relative 17 %   Lymphs Abs 1.5 1.0 - 3.6 K/uL   Monocytes Relative 17 %   Monocytes Absolute 1.4 (H) 0.2 - 0.9 K/uL   Eosinophils Relative 2 %   Eosinophils Absolute 0.1 0 - 0.7 K/uL   Basophils Relative 1 %   Basophils Absolute 0.1 0 - 0.1 K/uL  Lactic acid, plasma     Status: None   Collection Time: 09/26/16 12:03 PM  Result Value Ref Range   Lactic Acid, Venous 1.6 0.5 - 1.9 mmol/L  APTT     Status: Abnormal   Collection Time: 09/26/16  4:35 PM  Result Value Ref Range   aPTT 37 (H) 24 - 36 seconds    Comment:        IF BASELINE aPTT IS ELEVATED, SUGGEST PATIENT RISK ASSESSMENT BE USED TO DETERMINE APPROPRIATE ANTICOAGULANT THERAPY.   Protime-INR     Status: None   Collection Time: 09/26/16  4:35 PM  Result Value Ref Range   Prothrombin Time 14.4 11.4 - 15.2 seconds   INR 1.11   Glucose, capillary     Status: Abnormal   Collection Time: 09/26/16  9:14 PM  Result Value Ref Range   Glucose-Capillary 192 (H) 65 - 99 mg/dL  Heparin level (unfractionated)     Status: None   Collection Time: 09/27/16  1:03 AM  Result Value Ref Range   Heparin Unfractionated 0.33 0.30 - 0.70 IU/mL    Comment:        IF HEPARIN RESULTS ARE BELOW EXPECTED VALUES, AND PATIENT DOSAGE HAS BEEN CONFIRMED, SUGGEST FOLLOW UP TESTING OF ANTITHROMBIN III LEVELS.   Comprehensive metabolic panel      Status: Abnormal   Collection Time: 09/27/16  1:03 AM  Result Value Ref Range   Sodium 128 (L) 135 - 145 mmol/L   Potassium 3.9 3.5 - 5.1 mmol/L   Chloride 93 (L) 101 - 111 mmol/L   CO2 29 22 - 32 mmol/L   Glucose, Bld 191 (H) 65 - 99 mg/dL   BUN 28 (H) 6 - 20 mg/dL   Creatinine, Ser 3.77 (H) 0.44 - 1.00 mg/dL   Calcium 8.7 (L) 8.9 - 10.3 mg/dL   Total Protein 5.4 (L) 6.5 - 8.1 g/dL   Albumin 2.6 (L) 3.5 - 5.0 g/dL   AST 22 15 - 41 U/L   ALT 11 (L) 14 - 54 U/L   Alkaline Phosphatase 148 (H) 38 - 126 U/L   Total Bilirubin 1.5 (H) 0.3 - 1.2 mg/dL   GFR calc non Af Amer 10 (L) >60 mL/min   GFR calc Af Amer 12 (L) >60 mL/min    Comment: (NOTE) The eGFR has been calculated using the CKD EPI equation. This calculation has not been validated in all clinical situations. eGFR's persistently <60 mL/min signify possible Chronic Kidney Disease.    Anion gap 6 5 - 15  CBC     Status: Abnormal   Collection Time: 09/27/16  1:03 AM  Result Value Ref Range   WBC 8.2 3.6 - 11.0 K/uL   RBC 3.08 (L) 3.80 - 5.20 MIL/uL   Hemoglobin 10.6 (L) 12.0 - 16.0 g/dL   HCT 31.4 (L) 35.0 - 47.0 %   MCV 102.0 (H) 80.0 - 100.0 fL   MCH 34.3 (H) 26.0 - 34.0 pg   MCHC 33.6 32.0 - 36.0 g/dL   RDW 18.1 (H) 11.5 - 14.5 %   Platelets 106 (L) 150 - 440 K/uL  Phosphorus     Status: None   Collection Time: 09/27/16  1:03 AM  Result Value Ref Range   Phosphorus 2.5 2.5 - 4.6 mg/dL  Parathyroid hormone, intact (no Ca)     Status: None   Collection Time: 09/27/16  1:03 AM  Result Value Ref Range   PTH 51 15 - 65 pg/mL    Comment: (NOTE) Performed At: [facility] [place], [place] [id number] Lindon Romp MD [id number]   Glucose, capillary     Status: Abnormal   Collection  Time: 09/27/16  7:27 AM  Result Value Ref Range   Glucose-Capillary 126 (H) 65 - 99 mg/dL  Heparin level (unfractionated)     Status: None   Collection Time: 09/27/16  9:07 AM  Result Value Ref Range    Heparin Unfractionated 0.36 0.30 - 0.70 IU/mL    Comment:        IF HEPARIN RESULTS ARE BELOW EXPECTED VALUES, AND PATIENT DOSAGE HAS BEEN CONFIRMED, SUGGEST FOLLOW UP TESTING OF ANTITHROMBIN III LEVELS.   Glucose, capillary     Status: Abnormal   Collection Time: 09/27/16 12:02 PM  Result Value Ref Range   Glucose-Capillary 158 (H) 65 - 99 mg/dL  Hepatitis B surface antigen     Status: None   Collection Time: 09/27/16  1:39 PM  Result Value Ref Range   Hepatitis B Surface Ag Negative Negative    Comment: (NOTE) Performed At: [facility] [place], [place] [id number] Lindon Romp MD [id number]   Glucose, capillary     Status: Abnormal   Collection Time: 09/27/16  6:03 PM  Result Value Ref Range   Glucose-Capillary 160 (H) 65 - 99 mg/dL   Comment 1 Notify RN   Glucose, capillary     Status: Abnormal   Collection Time: 09/27/16  9:13 PM  Result Value Ref Range   Glucose-Capillary 148 (H) 65 - 99 mg/dL  Protime-INR     Status: None   Collection Time: 09/28/16  3:40 AM  Result Value Ref Range   Prothrombin Time 13.7 11.4 - 15.2 seconds   INR 1.05   CBC     Status: Abnormal   Collection Time: 09/28/16  3:40 AM  Result Value Ref Range   WBC 9.5 3.6 - 11.0 K/uL   RBC 3.24 (L) 3.80 - 5.20 MIL/uL   Hemoglobin 11.2 (L) 12.0 - 16.0 g/dL   HCT 34.0 (L) 35.0 - 47.0 %   MCV 104.9 (H) 80.0 - 100.0 fL   MCH 34.4 (H) 26.0 - 34.0 pg   MCHC 32.8 32.0 - 36.0 g/dL   RDW 18.2 (H) 11.5 - 14.5 %   Platelets 110 (L) 150 - 440 K/uL  Heparin level (unfractionated)     Status: Abnormal   Collection Time: 09/28/16  3:40 AM  Result Value Ref Range   Heparin Unfractionated 0.25 (L) 0.30 - 0.70 IU/mL    Comment:        IF HEPARIN RESULTS ARE BELOW EXPECTED VALUES, AND PATIENT DOSAGE HAS BEEN CONFIRMED, SUGGEST FOLLOW UP TESTING OF ANTITHROMBIN III LEVELS.   Glucose, capillary     Status: Abnormal   Collection Time: 09/28/16  7:30 AM  Result Value Ref Range    Glucose-Capillary 157 (H) 65 - 99 mg/dL  Glucose, capillary     Status: Abnormal   Collection Time: 09/28/16 11:51 AM  Result Value Ref Range   Glucose-Capillary 200 (H) 65 - 99 mg/dL   Comment 1 Notify RN     Radiology No results found.    Assessment/Plan  Lymphedema Her legs are very largely swollen bilaterally with severe ulcerations.  ESRD on hemodialysis [facility]) Renal failure certainly worsens his wound healing potential. If possible, removal of more fluid would be of great benefit to her lower extremity swelling.  Atrial fibrillation (HCC) On anticoagulation  DM (diabetes mellitus) Certainly worsens for wound healing situation. I think it's very important at this point we'll also assess her perfusion. Glucose control very important for wound healing as well as reduction  of progression of atherosclerosis.  Ulceration ([place]) The patient has multiple ulcerations of both lower extremities including bilateral heel ulcers, bilateral foot ulcers, and a large right calf ulceration. All her markedly worsened by her severe swelling and I think putting her in [place] boots today will be of some help. We will also assess her for arterial insufficiency given her large number of atherosclerotic risk factors. This is a very severe and limb threatening problem and I had a long discussion today with the patient and her husband regarding the fact that limb loss is certainly possible. I will see her back following her noninvasive studies.    Leotis Pain, MD  12/14/2016 11:33 AM    This note was created with Dragon medical transcription system.  Any errors from dictation are purely unintentional

## 2016-12-14 NOTE — Assessment & Plan Note (Signed)
On anticoagulation 

## 2016-12-22 ENCOUNTER — Emergency Department
Admission: EM | Admit: 2016-12-22 | Discharge: 2016-12-22 | Disposition: A | Discharge status: Home / Self Care | Payer: Medicare Other | Attending: Emergency Medicine | Admitting: Emergency Medicine

## 2016-12-22 ENCOUNTER — Encounter: Payer: Self-pay | Admitting: Emergency Medicine

## 2016-12-22 DIAGNOSIS — I509 Heart failure, unspecified: Secondary | ICD-10-CM | POA: Diagnosis not present

## 2016-12-22 DIAGNOSIS — Z794 Long term (current) use of insulin: Secondary | ICD-10-CM | POA: Insufficient documentation

## 2016-12-22 DIAGNOSIS — L03114 Cellulitis of left upper limb: Secondary | ICD-10-CM | POA: Insufficient documentation

## 2016-12-22 DIAGNOSIS — Z992 Dependence on renal dialysis: Secondary | ICD-10-CM | POA: Diagnosis not present

## 2016-12-22 DIAGNOSIS — Z79899 Other long term (current) drug therapy: Secondary | ICD-10-CM | POA: Diagnosis not present

## 2016-12-22 DIAGNOSIS — I132 Hypertensive heart and chronic kidney disease with heart failure and with stage 5 chronic kidney disease, or end stage renal disease: Secondary | ICD-10-CM | POA: Diagnosis not present

## 2016-12-22 DIAGNOSIS — E1122 Type 2 diabetes mellitus with diabetic chronic kidney disease: Secondary | ICD-10-CM | POA: Diagnosis not present

## 2016-12-22 DIAGNOSIS — L539 Erythematous condition, unspecified: Secondary | ICD-10-CM | POA: Diagnosis present

## 2016-12-22 DIAGNOSIS — Z85828 Personal history of other malignant neoplasm of skin: Secondary | ICD-10-CM | POA: Diagnosis not present

## 2016-12-22 DIAGNOSIS — N186 End stage renal disease: Secondary | ICD-10-CM | POA: Diagnosis not present

## 2016-12-22 LAB — CBC WITH DIFFERENTIAL/PLATELET
Basophils Absolute: 0.1 10*3/uL (ref 0–0.1)
Basophils Relative: 1 %
Eosinophils Absolute: 0.5 10*3/uL (ref 0–0.7)
Eosinophils Relative: 6 %
HCT: 37.5 % (ref 35.0–47.0)
Hemoglobin: 12.1 g/dL (ref 12.0–16.0)
Lymphocytes Relative: 28 %
Lymphs Abs: 2.4 10*3/uL (ref 1.0–3.6)
MCH: 32 pg (ref 26.0–34.0)
MCHC: 32.2 g/dL (ref 32.0–36.0)
MCV: 99.3 fL (ref 80.0–100.0)
Monocytes Absolute: 1.2 10*3/uL — ABNORMAL HIGH (ref 0.2–0.9)
Monocytes Relative: 14 %
Neutro Abs: 4.5 10*3/uL (ref 1.4–6.5)
Neutrophils Relative %: 51 %
Platelets: 177 10*3/uL (ref 150–440)
RBC: 3.78 MIL/uL — ABNORMAL LOW (ref 3.80–5.20)
RDW: 17.1 % — ABNORMAL HIGH (ref 11.5–14.5)
WBC: 8.7 10*3/uL (ref 3.6–11.0)

## 2016-12-22 LAB — BASIC METABOLIC PANEL
Anion gap: 8 (ref 5–15)
BUN: 14 mg/dL (ref 6–20)
CO2: 31 mmol/L (ref 22–32)
Calcium: 9 mg/dL (ref 8.9–10.3)
Chloride: 97 mmol/L — ABNORMAL LOW (ref 101–111)
Creatinine, Ser: 3.32 mg/dL — ABNORMAL HIGH (ref 0.44–1.00)
GFR calc Af Amer: 14 mL/min — ABNORMAL LOW (ref >60)
GFR calc non Af Amer: 12 mL/min — ABNORMAL LOW (ref >60)
Glucose, Bld: 176 mg/dL — ABNORMAL HIGH (ref 65–99)
Potassium: 3.5 mmol/L (ref 3.5–5.1)
Sodium: 136 mmol/L (ref 135–145)

## 2016-12-22 MED ORDER — CLINDAMYCIN HCL 300 MG PO CAPS: 300.0000 mg | ORAL_CAPSULE | Freq: Three times a day (TID) | ORAL | 0 refills | Status: DC

## 2016-12-22 MED ORDER — CLINDAMYCIN HCL 150 MG PO CAPS
300.0000 mg | ORAL_CAPSULE | Freq: Once | ORAL | Status: AC
  Administered 2016-12-22: 300 mg via ORAL
  Filled 2016-12-22: qty 2

## 2016-12-22 NOTE — Discharge Instructions (Signed)
Return to the ER for any worsening redness, or if you develop pain or swelling at the arm, or for any system wide symptoms such as fever, nausea, vomiting, headache or dizziness - or any other symptoms concerning to you.  I spoke with Dr. Holley Raring, and he requests that you call your dialysis center to get seen tomorrow for dialysis.

## 2016-12-22 NOTE — ED Triage Notes (Signed)
Pt to ED via EMS from dialysis c/o abscess to left arm.  Pt receives dialysis MWF, did not have dialysis today due to abscess at fistula.  Pt left forearm red, denies pain.

## 2016-12-22 NOTE — ED Provider Notes (Signed)
[facility] Emergency Department Provider Note ____________________________________________   I have reviewed the triage vital signs and the triage nursing note.  HISTORY  Chief Complaint Abscess and Vascular Access Problem   Historian Patient  HPI [name] is a 83 y.o. female provided by EMS from dialysis center because of redness around her left forearm past for access site. Patient states she's not having any increased pain, and there is not any more swelling in, the morphology looks the same.  She did state that there is redness there is not typical, and there is a scab that was oozing this morning at dialysis but is scabbed over again.  Symptoms are mild and nothing makes it worse or better.    Past Medical History:  Diagnosis Date  . A-fib (HCC)    not on anticoagulation  . Anemia   . Bilateral lower extremity edema   . CHF (congestive heart failure) ([place])   . Diabetes mellitus without complication [facility])    Patient takes Insulin  . Dysrhythmia   . ESRD (end stage renal disease) ([place])    Monday, wednesday, Friday DIalysis  . Essential tremor   . HTN (hypertension)   . OSA (obstructive sleep apnea)   . Osteoarthritis   . Renal insufficiency    Patient is on dialysis and normal days are M,W and F.  . Skin cancer    Resected from legs    Patient Active Problem List   Diagnosis Date Noted  . Ulceration ([place]) 12/14/2016  . Lymphedema 11/16/2016  . Pressure injury of skin 09/27/2016  . DVT (deep venous thrombosis) ([place]) 09/26/2016  . Palliative care by specialist   . Closed nondisplaced fracture of lateral condyle of right femur ([place])   . Hyponatremia 09/09/2016  . Hypoxia 04/06/2016  . Healthcare-associated pneumonia 08/14/2015  . Hypoglycemia 08/14/2015  . Hypotension 08/14/2015  . ESRD on hemodialysis ([place]) 08/14/2015  . DM (diabetes mellitus) ([place]) 08/14/2015  . Pleural effusion on right   . Protein-calorie malnutrition,  severe ([place]) 08/06/2015  . Community acquired pneumonia   . Pleural effusion 08/03/2015  . Acute respiratory failure ([place]) 08/03/2015  . Cough 08/03/2015  . Atrial fibrillation ([place]) 08/03/2015    Past Surgical History:  Procedure Laterality Date  . ABDOMINAL HYSTERECTOMY    . ANKLE FRACTURE SURGERY     right ankle  . CHOLECYSTECTOMY    . ESOPHAGOGASTRODUODENOSCOPY (EGD) WITH PROPOFOL N/A 10/09/2015   Procedure: ESOPHAGOGASTRODUODENOSCOPY (EGD) WITH PROPOFOL;  Surgeon: Hulen Luster, MD;  Location: [facility] ENDOSCOPY;  Service: Gastroenterology;  Laterality: N/A;  . FEMUR FRACTURE SURGERY     left femur  . KNEE ARTHROPLASTY    . NEUROPLASTY / TRANSPOSITION MEDIAN NERVE AT CARPAL TUNNEL BILATERAL    . PERIPHERAL VASCULAR CATHETERIZATION Left 08/06/2015   Procedure: A/V Shuntogram/Fistulagram;  Surgeon: Algernon Huxley, MD;  Location: [place] CV LAB;  Service: Cardiovascular;  Laterality: Left;  . PERIPHERAL VASCULAR CATHETERIZATION Left 11/20/2015   Procedure: A/V Shuntogram/Fistulagram;  Surgeon: Algernon Huxley, MD;  Location: [place] CV LAB;  Service: Cardiovascular;  Laterality: Left;  . PERIPHERAL VASCULAR CATHETERIZATION N/A 11/20/2015   Procedure: A/V Shunt Intervention;  Surgeon: Algernon Huxley, MD;  Location: [place] CV LAB;  Service: Cardiovascular;  Laterality: N/A;  . PERIPHERAL VASCULAR CATHETERIZATION N/A 09/27/2016   Procedure: IVC Filter Insertion;  Surgeon: Algernon Huxley, MD;  Location: [place] CV LAB;  Service: Cardiovascular;  Laterality: N/A;  . TUBAL LIGATION  Prior to Admission medications   Medication Sig Start Date End Date Taking? Authorizing Provider  allopurinol (ZYLOPRIM) 100 MG tablet Take 100 mg by mouth daily.     Historical Provider, MD  amiodarone (PACERONE) 100 MG tablet Take 100 mg by mouth daily.    Historical Provider, MD  apixaban (ELIQUIS) 5 MG TABS tablet Take 1 tablet (5 mg total) by mouth 2 (two) times daily. 09/28/16 10/26/16  Fritzi Mandes, MD  benzonatate (TESSALON) 100 MG capsule Take by mouth 3 (three) times daily as needed for cough.    Historical Provider, MD  calcium acetate (PHOSLO) 667 MG capsule Take 1 capsule (667 mg total) by mouth 2 (two) times daily between meals. Patient not taking: Reported on 11/16/2016 04/10/16   Vaughan Basta, MD  cholecalciferol (VITAMIN D) 1000 units tablet Take 1,000 Units by mouth daily.    Historical Provider, MD  clindamycin (CLEOCIN) 300 MG capsule Take 1 capsule (300 mg total) by mouth 3 (three) times daily. 12/22/16   Lisa Roca, MD  dextromethorphan (DELSYM COUGH CHILDRENS) 30 MG/5ML liquid Take by mouth as needed for cough.    Historical Provider, MD  diltiazem (CARDIZEM CD) 180 MG 24 hr capsule Take 180 mg by mouth daily.    Historical Provider, MD  docusate sodium (COLACE) 100 MG capsule Take 1 capsule (100 mg total) by mouth 2 (two) times daily. 04/10/16   Vaughan Basta, MD  fluticasone (FLONASE) 50 MCG/ACT nasal spray Place 2 sprays into both nostrils at bedtime.    Historical Provider, MD  folic acid-vitamin b complex-vitamin c-selenium-zinc (DIALYVITE) 3 MG TABS tablet Take 1 tablet by mouth daily.    Historical Provider, MD  guaiFENesin (MUCINEX) 600 MG 12 hr tablet Take 1 tablet (600 mg total) by mouth 2 (two) times daily. Patient not taking: Reported on 11/16/2016 09/21/16   Gladstone Lighter, MD  insulin glargine (LANTUS) 100 UNIT/ML injection Inject 0.08 mLs (8 Units total) into the skin at bedtime. 09/21/16   Gladstone Lighter, MD  ipratropium-albuterol (DUONEB) 0.5-2.5 (3) MG/3ML SOLN Take 3 mLs by nebulization 4 (four) times daily as needed (for wheezing/shortness of breath).    Historical Provider, MD  lidocaine-prilocaine (EMLA) cream Apply 1 application topically as needed. Apply to access site (left forearm) 1 hour prior to dialysis and cover with clear wrap. Mon, wed, fri 0500    Historical Provider, MD  meclizine (ANTIVERT) 32 MG tablet Take 32 mg by  mouth 3 (three) times daily as needed.    Historical Provider, MD  midodrine (PROAMATINE) 10 MG tablet Take 1 tablet (10 mg total) by mouth 3 (three) times daily with meals. Patient taking differently: Take 10 mg by mouth 3 (three) times daily with meals. [place] Re[place], thu, sat - 0900,1800,2100 09/21/16   Gladstone Lighter, MD  mirtazapine (REMERON) 7.5 MG tablet Take 7.5 mg by mouth at bedtime.    Historical Provider, MD  Multiple Vitamin (MULTIVITAMIN WITH MINERALS) TABS tablet Take 1 tablet by mouth daily.    Historical Provider, MD  nystatin cream (MYCOSTATIN) Apply 1 application topically 2 (two) times daily as needed (for itching).     Historical Provider, MD  ondansetron (ZOFRAN) 4 MG tablet Take 4 mg by mouth every 8 (eight) hours as needed for nausea or vomiting.    Historical Provider, MD  pantoprazole (PROTONIX) 40 MG tablet Take 40 mg by mouth daily.    Historical Provider, MD  polyethylene glycol (MIRALAX / GLYCOLAX) packet Take 17 g by mouth daily. In [place]  fluid of choice    Historical Provider, MD  promethazine (PHENERGAN) 25 MG tablet Take 25 mg by mouth every 6 (six) hours as needed for nausea or vomiting.     Historical Provider, MD  propranolol (INDERAL) 20 MG tablet Take 1 tablet (20 mg total) by mouth 2 (two) times daily. Patient not taking: Reported on 11/16/2016 09/21/16   Gladstone Lighter, MD  QUEtiapine (SEROQUEL) 25 MG tablet Take 1 tablet (25 mg total) by mouth at bedtime. Patient not taking: Reported on 11/16/2016 09/21/16   Gladstone Lighter, MD  ranitidine (ZANTAC) 150 MG capsule Take 150 mg by mouth 2 (two) times daily.    Historical Provider, MD  RENVELA 800 MG tablet Take 800-1,600 mg by mouth 5 (five) times daily. Pt takes two tablets three times daily with meals and one tablet two times daily with snacks. 06/30/15   Historical Provider, MD  senna (SENOKOT) 8.6 MG TABS tablet Take 2 tablets (17.2 mg total) by mouth daily as needed for mild constipation. Patient not  taking: Reported on 11/16/2016 04/10/16   Vaughan Basta, MD  traMADol (ULTRAM) 50 MG tablet Take 1 tablet (50 mg total) by mouth every 6 (six) hours as needed for moderate pain or severe pain. 09/21/16   Gladstone Lighter, MD    Allergies  Allergen Reactions  . Angiotensin Receptor Blockers Other (See Comments)    Reaction:  Hyperkalemia  . Penicillins Rash and Other (See Comments)    rash    Family History  Problem Relation Age of Onset  . Diabetes type II Father   . Breast cancer Sister 36  . Breast cancer Maternal Grandmother     Social History Social History  Substance Use Topics  . Smoking status: Never Smoker  . Smokeless tobacco: Never Used  . Alcohol use No    Review of Systems  Constitutional: Negative for fever. Eyes: Negative for visual changes. ENT: Negative for sore throat. Cardiovascular: Negative for chest pain. Respiratory: Negative for shortness of breath. Gastrointestinal: Negative for abdominal pain, vomiting and diarrhea. Genitourinary: Negative for dysuria. Musculoskeletal: Negative for back pain. Skin: Redness of the left forearm Neurological: Negative for headache. 10 point Review of Systems otherwise negative ____________________________________________   PHYSICAL EXAM:  VITAL SIGNS: ED Triage Vitals  Enc Vitals Group     BP 12/22/16 1305 (!) 90/51     Pulse Rate 12/22/16 1305 88     Resp 12/22/16 1305 17     Temp 12/22/16 1305 97.4 F (36.3 C)     Temp Source 12/22/16 1305 Oral     SpO2 12/22/16 1305 100 %     Weight 12/22/16 1306 234 lb (106.1 kg)     Height 12/22/16 1306 5\' 2"  (1.575 m)     Head Circumference --      Peak Flow --      Pain Score --      Pain Loc --      Pain Edu? --      Excl. in [place]? --      Constitutional: Alert and oriented. Well appearing and in no distress. HEENT   Head: Normocephalic and atraumatic.      Eyes: Conjunctivae are normal. PERRL. Normal extraocular movements.      Ears:          Nose: No congestion/rhinnorhea.   Mouth/Throat: Mucous membranes are moist.   Neck: No stridor. Cardiovascular/Chest: Normal rate, regular rhythm.  No murmurs, rubs, or gallops. Respiratory: Normal respiratory effort without tachypnea nor  retractions. Breath sounds are clear and equal bilaterally. No wheezes/rales/rhonchi. Gastrointestinal: Soft. No distention, no guarding, no rebound. Nontender.  Obese  Genitourinary/rectal:Deferred Musculoskeletal: Left forearm, fistula with palpable thrill, nontender. 2 small scabs. Small amount of erythema surrounding the fistula site. No abscess. No induration. Neurologic:  Normal speech and language. No gross or focal neurologic deficits are appreciated. Skin:  Left forearm slight erythema around the forearm fistula. Psychiatric: Mood and affect are normal. Speech and behavior are normal. Patient exhibits appropriate insight and judgment.   ____________________________________________  LABS (pertinent positives/negatives)  Labs Reviewed  CBC WITH DIFFERENTIAL/PLATELET - Abnormal; Notable for the following:       Result Value   RBC 3.78 (*)    RDW 17.1 (*)    Monocytes Absolute 1.2 (*)    All other components within normal limits  BASIC METABOLIC PANEL - Abnormal; Notable for the following:    Chloride 97 (*)    Glucose, Bld 176 (*)    Creatinine, Ser 3.32 (*)    GFR calc non Af Amer 12 (*)    GFR calc Af Amer 14 (*)    All other components within normal limits    ____________________________________________    EKG I, Lisa Roca, MD, the attending physician have personally viewed and interpreted all ECGs.  None ____________________________________________  RADIOLOGY All Xrays were viewed by me. Imaging interpreted by Radiologist.  None __________________________________________  PROCEDURES  Procedure(s) performed: None  Critical Care performed: None  ____________________________________________   ED COURSE /  ASSESSMENT AND PLAN  Pertinent labs & imaging results that were available during my care of the patient were reviewed by me and considered in my medical decision making (see chart for details).   Small amount of erythema concerning for cellulitis of the left forearm at the site of the fistula. There is no evidence of abscess. No streaking. Patient denies systemic symptoms of illness. She is afebrile here. She does not report fevers. She does have slightly low blood pressure, however on review of prior vital signs, it appears that she chronically has blood pressure in the 90s. I don't believe this is an indication of sepsis.  I'm going to check labs to check renal function and need for dialysis. I will start her on clindamycin for cellulitis to left forearm.  Again no fever, no elevated white blood cell count. No left shift. No systemic symptoms. She does have blood pressure 90/58, but per patient this is baseline. On my chart review this appears to be baseline. Urinalysis [place] with Dr. Holley Raring, who knows her well and also states that this is typical/baseline blood pressure for her.  Dr. Holley Raring recommends that she call the dialysis center and she will be seen tomorrow since she missed her dialysis today. No indication that she needs emergency dialysis in the emergency department now.  CONSULTATIONS:   Dr. Holley Raring, nephrology   Patient / Family / Caregiver informed of clinical course, medical decision-making process, and agree with plan.   I discussed return precautions, follow-up instructions, and discharge instructions with patient and/or family.   ___________________________________________   FINAL CLINICAL IMPRESSION(S) / ED DIAGNOSES   Final diagnoses:  Cellulitis of left arm              Note: This dictation was prepared with Dragon dictation. Any transcriptional errors that result from this process are unintentional    Lisa Roca, MD 12/22/16 1453

## 2016-12-23 ENCOUNTER — Ambulatory Visit (INDEPENDENT_AMBULATORY_CARE_PROVIDER_SITE_OTHER): Payer: Medicare Other

## 2016-12-23 ENCOUNTER — Encounter (INDEPENDENT_AMBULATORY_CARE_PROVIDER_SITE_OTHER): Payer: Self-pay

## 2016-12-23 ENCOUNTER — Encounter (INDEPENDENT_AMBULATORY_CARE_PROVIDER_SITE_OTHER): Payer: Self-pay | Admitting: Vascular Surgery

## 2016-12-23 ENCOUNTER — Ambulatory Visit (INDEPENDENT_AMBULATORY_CARE_PROVIDER_SITE_OTHER): Payer: Medicare Other | Admitting: Vascular Surgery

## 2016-12-23 VITALS — BP 94/63 | HR 88 | Resp 16

## 2016-12-23 DIAGNOSIS — Z992 Dependence on renal dialysis: Secondary | ICD-10-CM

## 2016-12-23 DIAGNOSIS — L98499 Non-pressure chronic ulcer of skin of other sites with unspecified severity: Secondary | ICD-10-CM | POA: Diagnosis not present

## 2016-12-23 DIAGNOSIS — N186 End stage renal disease: Secondary | ICD-10-CM

## 2016-12-23 DIAGNOSIS — E119 Type 2 diabetes mellitus without complications: Secondary | ICD-10-CM

## 2016-12-23 DIAGNOSIS — L89899 Pressure ulcer of other site, unspecified stage: Secondary | ICD-10-CM

## 2016-12-23 DIAGNOSIS — IMO0002 Reserved for concepts with insufficient information to code with codable children: Secondary | ICD-10-CM

## 2016-12-23 DIAGNOSIS — Z794 Long term (current) use of insulin: Secondary | ICD-10-CM | POA: Diagnosis not present

## 2016-12-24 NOTE — Progress Notes (Signed)
Subjective:    Patient ID: [name], female    DOB: [birth year], 83 y.o.   MRN: [id number] Chief Complaint  Patient presents with  . Follow-up   Patient last seen on 12/15/15 in follow up of bilateral lower extremity ulcerations. She has developed heel ulcerations, foot ulcerations, and a right posterior calf ulceration with necrotic eschar. She has lots of pain in her legs. Her lower extremity swelling is really massive at this point. She is quite immobile after her knee fracture on the right and her debilitated state. She was critically ill and spent a long time and hospital earlier this year, and has never really recovered. She denies fevers or chills or signs systemic infection. Unna boot now have been restarted once a week by nursing services. Her husband reports that they have been unable to take off much fluid secondary to low blood pressure with dialysis. This has resulted in massive fluid buildup. Her primary care physician tried a diuretic but she really does not make that much urine. She was sent to the ED yesterday by dialysis for "bleeding" however upon arrival to the ED she was treated for cellulitis of the left arm (where access is located) with clindamycin and discharged home. She denies any issues with the function of her access. Today the patient underwent an ABI which was notable for no significant lower extremity arterial disease, toe brachial indicies are normal.     Review of Systems  Constitutional: Negative.   HENT: Negative.   Eyes: Negative.   Respiratory: Negative.   Cardiovascular: Positive for leg swelling.       Bilateral Lower Extremity Pain  Gastrointestinal: Negative.   Endocrine: Negative.   Genitourinary:       ESRD - prolonged bleeding from fistula  Musculoskeletal: Negative.   Skin:       Bilateral ulcerations.   Allergic/Immunologic: Negative.   Neurological: Negative.   Hematological: Negative.   Psychiatric/Behavioral: Negative.         Objective:   Physical Exam Gen:  WD/WN, NAD Head: [place]/AT, No temporalis wasting. Ear/Nose/Throat: Hearing grossly intact, nares w/o erythema or drainage, trachea midline Eyes: Conjunctiva clear. Sclera non-icteric Neck: Supple.  No JVD.  Pulmonary:  Good air movement, no use of accessory muscles.  Cardiac: Irregularly irregular Vascular: Thrill and bruit are present in left arm AV fistula Vessel Right Left  Radial Palpable Palpable  Ulnar Palpable Palpable  Brachial Palpable Palpable  Carotid Palpable, without bruit Palpable, without bruit  Aorta Not palpable N/A  Femoral Palpable Palpable  Popliteal Not Palpable Not Palpable  PT Not Palpable Not Palpable  DP Not Palpable Not Palpable   Left Upper Extremity: Fistula mildly aneurysmal, no skin threatening noted. No erythema, no cellulitis noted. No ecchymosis noted. Bruit and thrill noted.   Gastrointestinal: soft, non-tender/non-distended. No guarding/reflex.  Musculoskeletal:  4+, massive bilateral lower extremity edema. Wheelchair-bound Neurologic: Sensation grossly intact in extremities.  Symmetrical.  Speech is fluent.  Psychiatric: Judgment intact, Mood & affect appropriate for pt's clinical situation. Dermatologic: The patient has large ulcerations with necrotic eschar and fibrinous exudate on both heels, both anterior ankle and top of the foot areas, and the right posterior lateral calf area. All are reasonably large greater than 5 cm. None have any granulation tissue to speak of. Lymph : No Cervical, Axillary, or Inguinal lymphadenopathy.  BP 94/63   Pulse 88   Resp 16   Past Medical History:  Diagnosis Date  . A-fib ([place])  not on anticoagulation  . Anemia   . Bilateral lower extremity edema   . CHF (congestive heart failure) ([place])   . Diabetes mellitus without complication [facility])    Patient takes Insulin  . Dysrhythmia   . ESRD (end stage renal disease) ([place])    Monday, wednesday, Friday DIalysis  .  Essential tremor   . HTN (hypertension)   . OSA (obstructive sleep apnea)   . Osteoarthritis   . Renal insufficiency    Patient is on dialysis and normal days are M,W and F.  . Skin cancer    Resected from legs    Social History   Social History  . Marital status: Married    Spouse name: N/A  . Number of children: N/A  . Years of education: N/A   Occupational History  . Not on file.   Social History Main Topics  . Smoking status: Never Smoker  . Smokeless tobacco: Never Used  . Alcohol use No  . Drug use: No  . Sexual activity: Not on file   Other Topics Concern  . Not on file   Social History Narrative  . No narrative on file    Past Surgical History:  Procedure Laterality Date  . ABDOMINAL HYSTERECTOMY    . ANKLE FRACTURE SURGERY     right ankle  . CHOLECYSTECTOMY    . ESOPHAGOGASTRODUODENOSCOPY (EGD) WITH PROPOFOL N/A 10/09/2015   Procedure: ESOPHAGOGASTRODUODENOSCOPY (EGD) WITH PROPOFOL;  Surgeon: Hulen Luster, MD;  Location: [facility] ENDOSCOPY;  Service: Gastroenterology;  Laterality: N/A;  . FEMUR FRACTURE SURGERY     left femur  . KNEE ARTHROPLASTY    . NEUROPLASTY / TRANSPOSITION MEDIAN NERVE AT CARPAL TUNNEL BILATERAL    . PERIPHERAL VASCULAR CATHETERIZATION Left 08/06/2015   Procedure: A/V Shuntogram/Fistulagram;  Surgeon: Algernon Huxley, MD;  Location: [place] CV LAB;  Service: Cardiovascular;  Laterality: Left;  . PERIPHERAL VASCULAR CATHETERIZATION Left 11/20/2015   Procedure: A/V Shuntogram/Fistulagram;  Surgeon: Algernon Huxley, MD;  Location: [place] CV LAB;  Service: Cardiovascular;  Laterality: Left;  . PERIPHERAL VASCULAR CATHETERIZATION N/A 11/20/2015   Procedure: A/V Shunt Intervention;  Surgeon: Algernon Huxley, MD;  Location: [place] CV LAB;  Service: Cardiovascular;  Laterality: N/A;  . PERIPHERAL VASCULAR CATHETERIZATION N/A 09/27/2016   Procedure: IVC Filter Insertion;  Surgeon: Algernon Huxley, MD;  Location: [place] CV LAB;  Service:  Cardiovascular;  Laterality: N/A;  . TUBAL LIGATION      Family History  Problem Relation Age of Onset  . Diabetes type II Father   . Breast cancer Sister 31  . Breast cancer Maternal Grandmother     Allergies  Allergen Reactions  . Angiotensin Receptor Blockers Other (See Comments)    Reaction:  Hyperkalemia  . Penicillins Rash and Other (See Comments)    rash       Assessment & Plan:  Patient last seen on 12/15/15 in follow up of bilateral lower extremity ulcerations. She has developed heel ulcerations, foot ulcerations, and a right posterior calf ulceration with necrotic eschar. She has lots of pain in her legs. Her lower extremity swelling is really massive at this point. She is quite immobile after her knee fracture on the right and her debilitated state. She was critically ill and spent a long time and hospital earlier this year, and has never really recovered. She denies fevers or chills or signs systemic infection. Unna boot now have been restarted once a week by nursing services. Her  husband reports that they have been unable to take off much fluid secondary to low blood pressure with dialysis. This has resulted in massive fluid buildup. Her primary care physician tried a diuretic but she really does not make that much urine. She was sent to the ED yesterday by dialysis for "bleeding" however upon arrival to the ED she was treated for cellulitis of the left arm (where access is located) with clindamycin and discharged home. She denies any issues with the function of her access.   1. ESRD on hemodialysis ([place]) - Worsening Will episode of bleeding at dialysis that prompted a trip to ED - recommend LUE fistulogram to assess patency and function of access. Procedure, risks and benefits explained to patient. All questions answered. Patient and husband understand and wish to proceed.   2. Type 2 diabetes mellitus without complication, with long-term current use of insulin (HCC) -  Stable Encouraged good control as its slows the progression of atherosclerotic disease  3. Decubitus ulcer of other site, unspecified ulcer stage - Stable ABI's relatively normal.  Will start [employer] again and see patient back in one month for wound check. If wounds worsen may consider lower extremity angiogram at that time.  Current Outpatient Prescriptions on File Prior to Visit  Medication Sig Dispense Refill  . allopurinol (ZYLOPRIM) 100 MG tablet Take 100 mg by mouth daily.     Marland Kitchen amiodarone (PACERONE) 100 MG tablet Take 100 mg by mouth daily.    . benzonatate (TESSALON) 100 MG capsule Take by mouth 3 (three) times daily as needed for cough.    . calcium acetate (PHOSLO) 667 MG capsule Take 1 capsule (667 mg total) by mouth 2 (two) times daily between meals. 60 capsule 0  . cholecalciferol (VITAMIN D) 1000 units tablet Take 1,000 Units by mouth daily.    . clindamycin (CLEOCIN) 300 MG capsule Take 1 capsule (300 mg total) by mouth 3 (three) times daily. 30 capsule 0  . dextromethorphan (DELSYM COUGH CHILDRENS) 30 MG/5ML liquid Take by mouth as needed for cough.    . diltiazem (CARDIZEM CD) 180 MG 24 hr capsule Take 180 mg by mouth daily.    Marland Kitchen docusate sodium (COLACE) 100 MG capsule Take 1 capsule (100 mg total) by mouth 2 (two) times daily. 10 capsule 0  . fluticasone (FLONASE) 50 MCG/ACT nasal spray Place 2 sprays into both nostrils at bedtime.    . folic acid-vitamin b complex-vitamin c-selenium-zinc (DIALYVITE) 3 MG TABS tablet Take 1 tablet by mouth daily.    Marland Kitchen guaiFENesin (MUCINEX) 600 MG 12 hr tablet Take 1 tablet (600 mg total) by mouth 2 (two) times daily. 20 tablet 0  . insulin glargine (LANTUS) 100 UNIT/ML injection Inject 0.08 mLs (8 Units total) into the skin at bedtime. 10 mL 11  . ipratropium-albuterol (DUONEB) 0.5-2.5 (3) MG/3ML SOLN Take 3 mLs by nebulization 4 (four) times daily as needed (for wheezing/shortness of breath).    . lidocaine-prilocaine (EMLA) cream Apply 1  application topically as needed. Apply to access site (left forearm) 1 hour prior to dialysis and cover with clear wrap. Mon, wed, fri 0500    . meclizine (ANTIVERT) 32 MG tablet Take 32 mg by mouth 3 (three) times daily as needed.    . midodrine (PROAMATINE) 10 MG tablet Take 1 tablet (10 mg total) by mouth 3 (three) times daily with meals. (Patient taking differently: Take 10 mg by mouth 3 (three) times daily with meals. Sun, [place], thu, sat - 0900,1800,2100)  90 tablet 2  . mirtazapine (REMERON) 7.5 MG tablet Take 7.5 mg by mouth at bedtime.    . Multiple Vitamin (MULTIVITAMIN WITH MINERALS) TABS tablet Take 1 tablet by mouth daily.    Marland Kitchen nystatin cream (MYCOSTATIN) Apply 1 application topically 2 (two) times daily as needed (for itching).     . ondansetron (ZOFRAN) 4 MG tablet Take 4 mg by mouth every 8 (eight) hours as needed for nausea or vomiting.    . pantoprazole (PROTONIX) 40 MG tablet Take 40 mg by mouth daily.    . polyethylene glycol (MIRALAX / GLYCOLAX) packet Take 17 g by mouth daily. In 4-8oz fluid of choice    . promethazine (PHENERGAN) 25 MG tablet Take 25 mg by mouth every 6 (six) hours as needed for nausea or vomiting.     . propranolol (INDERAL) 20 MG tablet Take 1 tablet (20 mg total) by mouth 2 (two) times daily. 60 tablet 2  . QUEtiapine (SEROQUEL) 25 MG tablet Take 1 tablet (25 mg total) by mouth at bedtime. 30 tablet 2  . ranitidine (ZANTAC) 150 MG capsule Take 150 mg by mouth 2 (two) times daily.    Marland Kitchen RENVELA 800 MG tablet Take 800-1,600 mg by mouth 5 (five) times daily. Pt takes two tablets three times daily with meals and one tablet two times daily with snacks.    . senna (SENOKOT) 8.6 MG TABS tablet Take 2 tablets (17.2 mg total) by mouth daily as needed for mild constipation. 30 each 0  . traMADol (ULTRAM) 50 MG tablet Take 1 tablet (50 mg total) by mouth every 6 (six) hours as needed for moderate pain or severe pain. 30 tablet 0  . apixaban (ELIQUIS) 5 MG TABS tablet Take  1 tablet (5 mg total) by mouth 2 (two) times daily. 60 tablet 1   No current facility-administered medications on file prior to visit.     There are no Patient Instructions on file for this visit. No Follow-up on file.   Cabela Pacifico A Bentlie Withem, PA-C

## 2016-12-28 ENCOUNTER — Ambulatory Visit: Payer: Medicare Other | Admitting: Internal Medicine

## 2016-12-28 ENCOUNTER — Other Ambulatory Visit (INDEPENDENT_AMBULATORY_CARE_PROVIDER_SITE_OTHER): Payer: Self-pay | Admitting: Vascular Surgery

## 2017-01-03 ENCOUNTER — Ambulatory Visit
Admission: RE | Admit: 2017-01-03 | Discharge: 2017-01-03 | Disposition: A | Discharge status: Home / Self Care | Payer: Medicare Other | Source: Ambulatory Visit | Attending: Vascular Surgery | Admitting: Vascular Surgery

## 2017-01-03 ENCOUNTER — Encounter
Admission: RE | Disposition: A | Discharge status: Home / Self Care | Payer: Self-pay | Source: Ambulatory Visit | Attending: Vascular Surgery

## 2017-01-03 DIAGNOSIS — Z888 Allergy status to other drugs, medicaments and biological substances status: Secondary | ICD-10-CM | POA: Insufficient documentation

## 2017-01-03 DIAGNOSIS — T82590A Other mechanical complication of surgically created arteriovenous fistula, initial encounter: Secondary | ICD-10-CM | POA: Insufficient documentation

## 2017-01-03 DIAGNOSIS — E1122 Type 2 diabetes mellitus with diabetic chronic kidney disease: Secondary | ICD-10-CM | POA: Insufficient documentation

## 2017-01-03 DIAGNOSIS — G4733 Obstructive sleep apnea (adult) (pediatric): Secondary | ICD-10-CM | POA: Insufficient documentation

## 2017-01-03 DIAGNOSIS — I132 Hypertensive heart and chronic kidney disease with heart failure and with stage 5 chronic kidney disease, or end stage renal disease: Secondary | ICD-10-CM | POA: Insufficient documentation

## 2017-01-03 DIAGNOSIS — N186 End stage renal disease: Secondary | ICD-10-CM | POA: Insufficient documentation

## 2017-01-03 DIAGNOSIS — Z880 Allergy status to penicillin: Secondary | ICD-10-CM | POA: Diagnosis not present

## 2017-01-03 DIAGNOSIS — L89619 Pressure ulcer of right heel, unspecified stage: Secondary | ICD-10-CM | POA: Insufficient documentation

## 2017-01-03 DIAGNOSIS — Z803 Family history of malignant neoplasm of breast: Secondary | ICD-10-CM | POA: Diagnosis not present

## 2017-01-03 DIAGNOSIS — L89629 Pressure ulcer of left heel, unspecified stage: Secondary | ICD-10-CM | POA: Diagnosis not present

## 2017-01-03 DIAGNOSIS — L89899 Pressure ulcer of other site, unspecified stage: Secondary | ICD-10-CM | POA: Diagnosis not present

## 2017-01-03 DIAGNOSIS — G250 Essential tremor: Secondary | ICD-10-CM | POA: Diagnosis not present

## 2017-01-03 DIAGNOSIS — M1990 Unspecified osteoarthritis, unspecified site: Secondary | ICD-10-CM | POA: Diagnosis not present

## 2017-01-03 DIAGNOSIS — Z7901 Long term (current) use of anticoagulants: Secondary | ICD-10-CM | POA: Insufficient documentation

## 2017-01-03 DIAGNOSIS — I509 Heart failure, unspecified: Secondary | ICD-10-CM | POA: Insufficient documentation

## 2017-01-03 DIAGNOSIS — Y832 Surgical operation with anastomosis, bypass or graft as the cause of abnormal reaction of the patient, or of later complication, without mention of misadventure at the time of the procedure: Secondary | ICD-10-CM | POA: Diagnosis not present

## 2017-01-03 DIAGNOSIS — Z833 Family history of diabetes mellitus: Secondary | ICD-10-CM | POA: Diagnosis not present

## 2017-01-03 DIAGNOSIS — Z90710 Acquired absence of both cervix and uterus: Secondary | ICD-10-CM | POA: Diagnosis not present

## 2017-01-03 DIAGNOSIS — T82868A Thrombosis of vascular prosthetic devices, implants and grafts, initial encounter: Secondary | ICD-10-CM | POA: Diagnosis not present

## 2017-01-03 DIAGNOSIS — Z85828 Personal history of other malignant neoplasm of skin: Secondary | ICD-10-CM | POA: Insufficient documentation

## 2017-01-03 DIAGNOSIS — Z794 Long term (current) use of insulin: Secondary | ICD-10-CM | POA: Insufficient documentation

## 2017-01-03 DIAGNOSIS — D649 Anemia, unspecified: Secondary | ICD-10-CM | POA: Insufficient documentation

## 2017-01-03 DIAGNOSIS — Z9049 Acquired absence of other specified parts of digestive tract: Secondary | ICD-10-CM | POA: Diagnosis not present

## 2017-01-03 DIAGNOSIS — Z992 Dependence on renal dialysis: Secondary | ICD-10-CM | POA: Insufficient documentation

## 2017-01-03 DIAGNOSIS — I4891 Unspecified atrial fibrillation: Secondary | ICD-10-CM | POA: Insufficient documentation

## 2017-01-03 HISTORY — PX: PERIPHERAL VASCULAR CATHETERIZATION: SHX172C

## 2017-01-03 LAB — POTASSIUM (ARMC VASCULAR LAB ONLY): Potassium (ARMC vascular lab): 3.2 — ABNORMAL LOW (ref 3.5–5.1)

## 2017-01-03 SURGERY — A/V FISTULAGRAM
Anesthesia: Moderate Sedation

## 2017-01-03 MED ORDER — FENTANYL CITRATE (PF) 100 MCG/2ML IJ SOLN
INTRAMUSCULAR | Status: DC | PRN
  Administered 2017-01-03: 50 ug via INTRAVENOUS

## 2017-01-03 MED ORDER — HYDROMORPHONE HCL 1 MG/ML IJ SOLN: 1.0000 mg | Freq: Once | INTRAMUSCULAR | Status: DC

## 2017-01-03 MED ORDER — IOPAMIDOL (ISOVUE-300) INJECTION 61%
INTRAVENOUS | Status: DC | PRN
  Administered 2017-01-03: 25 mL via INTRAVENOUS

## 2017-01-03 MED ORDER — SODIUM CHLORIDE FLUSH 0.9 % IV SOLN
INTRAVENOUS | Status: AC
  Filled 2017-01-03: qty 30

## 2017-01-03 MED ORDER — HEPARIN SODIUM (PORCINE) 1000 UNIT/ML IJ SOLN
INTRAMUSCULAR | Status: AC
Start: 2017-01-03 — End: 2017-01-03
  Filled 2017-01-03: qty 1

## 2017-01-03 MED ORDER — FAMOTIDINE 20 MG PO TABS: 40.0000 mg | ORAL_TABLET | ORAL | Status: DC | PRN

## 2017-01-03 MED ORDER — ONDANSETRON HCL 4 MG/2ML IJ SOLN: 4.0000 mg | Freq: Four times a day (QID) | INTRAMUSCULAR | Status: DC | PRN

## 2017-01-03 MED ORDER — METHYLPREDNISOLONE SODIUM SUCC 125 MG IJ SOLR: 125.0000 mg | INTRAMUSCULAR | Status: DC | PRN

## 2017-01-03 MED ORDER — MIDAZOLAM HCL 5 MG/5ML IJ SOLN
INTRAMUSCULAR | Status: AC
  Filled 2017-01-03: qty 5

## 2017-01-03 MED ORDER — HEPARIN (PORCINE) IN NACL 2-0.9 UNIT/ML-% IJ SOLN
INTRAMUSCULAR | Status: AC
  Filled 2017-01-03: qty 1000

## 2017-01-03 MED ORDER — MIDAZOLAM HCL 2 MG/2ML IJ SOLN
INTRAMUSCULAR | Status: DC | PRN
  Administered 2017-01-03: 2 mg via INTRAVENOUS

## 2017-01-03 MED ORDER — LIDOCAINE-EPINEPHRINE (PF) 2 %-1:200000 IJ SOLN
INTRAMUSCULAR | Status: AC
  Filled 2017-01-03: qty 20

## 2017-01-03 MED ORDER — FENTANYL CITRATE (PF) 100 MCG/2ML IJ SOLN
INTRAMUSCULAR | Status: AC
  Filled 2017-01-03: qty 2

## 2017-01-03 MED ORDER — SODIUM CHLORIDE 0.9 % IV SOLN: INTRAVENOUS | Status: DC

## 2017-01-03 MED ORDER — CLINDAMYCIN PHOSPHATE 300 MG/50ML IV SOLN
300.0000 mg | Freq: Once | INTRAVENOUS | Status: AC
Start: 2017-01-03 — End: 2017-01-03
  Administered 2017-01-03: 300 mg via INTRAVENOUS

## 2017-01-03 MED ORDER — CLINDAMYCIN PHOSPHATE 300 MG/50ML IV SOLN
INTRAVENOUS | Status: AC
  Filled 2017-01-03: qty 50

## 2017-01-03 SURGICAL SUPPLY — 5 items
CANNULA 5F STIFF (CANNULA) ×4 IMPLANT
DRAPE BRACHIAL (DRAPES) ×4 IMPLANT
PACK ANGIOGRAPHY (CUSTOM PROCEDURE TRAY) ×4 IMPLANT
SHEATH BRITE TIP 6FRX5.5 (SHEATH) ×4 IMPLANT
TOWEL OR 17X26 4PK STRL BLUE (TOWEL DISPOSABLE) ×4 IMPLANT

## 2017-01-03 NOTE — H&P (Signed)
[facility] History & Physical Update  The patient was interviewed and re-examined.  The patient's previous History and Physical has been reviewed and is unchanged.  There is no change in the plan of care. We plan to proceed with the scheduled procedure.  Leotis Pain, MD  01/03/2017, 10:46 AM

## 2017-01-03 NOTE — Op Note (Signed)
[facility]    OPERATIVE NOTE   PROCEDURE: 1.   Left brachiocephalic arteriovenous fistula cannulation under ultrasound guidance 2.   Left arm fistulagram including central venogram   PRE-OPERATIVE DIAGNOSIS: 1. ESRD 2. Poorly functional left radiocephalic AVF with prolonged bleeding  POST-OPERATIVE DIAGNOSIS: same as above   SURGEON: Leotis Pain, MD  ANESTHESIA: local with MCS  ESTIMATED BLOOD LOSS: minimal  FINDING(S): 1. No stenosis in the AVF with dual outflow in the upper arm and normal central venous circulation  SPECIMEN(S):  None  CONTRAST: 25 cc  FLUORO TIME: 1 minutes  MODERATE CONSCIOUS SEDATION TIME: Approximately 15 minutes with 2 mg of Versed and 50 mcg of Fentanyl   INDICATIONS: [name] is a 83 y.o. female who presents with malfunctioning left radiocephalic arteriovenous fistula.  The patient is scheduled for left arm fistulagram.  The patient is aware the risks include but are not limited to: bleeding, infection, thrombosis of the cannulated access, and possible anaphylactic reaction to the contrast.  The patient is aware of the risks of the procedure and elects to proceed forward.  DESCRIPTION: After full informed written consent was obtained, the patient was brought back to the angiography suite and placed supine upon the angiography table.  The patient was connected to monitoring equipment. Moderate conscious sedation was administered with a face to face encounter with the patient throughout the procedure with my supervision of the RN administering medicines and monitoring the patient's vital signs and mental status throughout from the start of the procedure until the patient was taken to the recovery room. The left arm was prepped and draped in the standard fashion for a percutaneous access intervention.  Under ultrasound guidance, the left radiocephalic arteriovenous fistula was cannulated with a micropuncture needle under direct  ultrasound guidance and a permanent image was performed.  The microwire was advanced into the fistula and the needle was exchanged for the a microsheath. Hand injections were completed to image the access including the central venous system. This demonstrated no stenosis in the AVF with dual outflow in the upper arm and normal central venous circulation.  Based on the images, this patient will need no intervention.  A 4-0 Monocryl purse-string suture was sewn around the sheath.  The sheath was removed while tying down the suture.  A sterile bandage was applied to the puncture site.  COMPLICATIONS: None  CONDITION: Stable   Leotis Pain  01/03/2017 11:50 AM   This note was created with Dragon Medical transcription system. Any errors in dictation are purely unintentional.

## 2017-01-04 ENCOUNTER — Encounter: Payer: Self-pay | Admitting: Vascular Surgery

## 2017-01-04 ENCOUNTER — Encounter: Payer: Medicare Other | Attending: Internal Medicine | Admitting: Internal Medicine

## 2017-01-04 DIAGNOSIS — G4730 Sleep apnea, unspecified: Secondary | ICD-10-CM | POA: Insufficient documentation

## 2017-01-04 DIAGNOSIS — Z794 Long term (current) use of insulin: Secondary | ICD-10-CM | POA: Diagnosis not present

## 2017-01-04 DIAGNOSIS — Z96653 Presence of artificial knee joint, bilateral: Secondary | ICD-10-CM | POA: Insufficient documentation

## 2017-01-04 DIAGNOSIS — Z880 Allergy status to penicillin: Secondary | ICD-10-CM | POA: Diagnosis not present

## 2017-01-04 DIAGNOSIS — E1142 Type 2 diabetes mellitus with diabetic polyneuropathy: Secondary | ICD-10-CM | POA: Insufficient documentation

## 2017-01-04 DIAGNOSIS — M1990 Unspecified osteoarthritis, unspecified site: Secondary | ICD-10-CM | POA: Diagnosis not present

## 2017-01-04 DIAGNOSIS — L89613 Pressure ulcer of right heel, stage 3: Secondary | ICD-10-CM | POA: Diagnosis not present

## 2017-01-04 DIAGNOSIS — E1122 Type 2 diabetes mellitus with diabetic chronic kidney disease: Secondary | ICD-10-CM | POA: Insufficient documentation

## 2017-01-04 DIAGNOSIS — M109 Gout, unspecified: Secondary | ICD-10-CM | POA: Diagnosis not present

## 2017-01-04 DIAGNOSIS — E11621 Type 2 diabetes mellitus with foot ulcer: Secondary | ICD-10-CM | POA: Insufficient documentation

## 2017-01-04 DIAGNOSIS — N186 End stage renal disease: Secondary | ICD-10-CM | POA: Diagnosis not present

## 2017-01-04 DIAGNOSIS — E785 Hyperlipidemia, unspecified: Secondary | ICD-10-CM | POA: Diagnosis not present

## 2017-01-04 DIAGNOSIS — Z85828 Personal history of other malignant neoplasm of skin: Secondary | ICD-10-CM | POA: Insufficient documentation

## 2017-01-04 DIAGNOSIS — L97213 Non-pressure chronic ulcer of right calf with necrosis of muscle: Secondary | ICD-10-CM | POA: Diagnosis not present

## 2017-01-04 DIAGNOSIS — E669 Obesity, unspecified: Secondary | ICD-10-CM | POA: Diagnosis not present

## 2017-01-04 DIAGNOSIS — I4891 Unspecified atrial fibrillation: Secondary | ICD-10-CM | POA: Diagnosis not present

## 2017-01-04 DIAGNOSIS — L97515 Non-pressure chronic ulcer of other part of right foot with muscle involvement without evidence of necrosis: Secondary | ICD-10-CM | POA: Insufficient documentation

## 2017-01-04 DIAGNOSIS — I120 Hypertensive chronic kidney disease with stage 5 chronic kidney disease or end stage renal disease: Secondary | ICD-10-CM | POA: Diagnosis not present

## 2017-01-04 DIAGNOSIS — Z6838 Body mass index (BMI) 38.0-38.9, adult: Secondary | ICD-10-CM | POA: Diagnosis not present

## 2017-01-04 DIAGNOSIS — L89620 Pressure ulcer of left heel, unstageable: Secondary | ICD-10-CM | POA: Diagnosis not present

## 2017-01-04 DIAGNOSIS — Z992 Dependence on renal dialysis: Secondary | ICD-10-CM | POA: Insufficient documentation

## 2017-01-04 DIAGNOSIS — K7460 Unspecified cirrhosis of liver: Secondary | ICD-10-CM | POA: Insufficient documentation

## 2017-01-04 DIAGNOSIS — L97525 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 without evidence of necrosis: Secondary | ICD-10-CM | POA: Insufficient documentation

## 2017-01-04 NOTE — Progress Notes (Signed)
[name], [name] ([id number]) Visit Report for 01/04/2017 Abuse/Suicide Risk Screen Details Patient Name: [name], [name] Date of Service: 01/04/2017 9:30 AM Medical Record Patient Account Number: [id number] Number: Treating RN: Ahmed Prima 1934-08-29 (83 y.o. Other Clinician: Date of Birth/Sex: Female) Treating ROBSON, MICHAEL Primary Care Mairlyn Tegtmeyer: Emily Filbert Kito Cuffe/Extender: G Referring Inaki Vantine: Melina Modena in Treatment: 0 Abuse/Suicide Risk Screen Items Answer ABUSE/SUICIDE RISK SCREEN: Has anyone close to you tried to hurt or harm you recentlyo No Do you feel uncomfortable with anyone in your familyo No Has anyone forced you do things that you didnot want to doo No Do you have any thoughts of harming yourselfo No Patient displays signs or symptoms of abuse and/or neglect. No Electronic Signature(s) Signed: 01/04/2017 3:17:24 PM By: Alric Quan Entered By: Alric Quan on 01/04/2017 09:45:28 [name] ([id number]) -------------------------------------------------------------------------------- Activities of Daily Living Details Patient Name: [name] Date of Service: 01/04/2017 9:30 AM Medical Record Patient Account Number: [id number] Number: Treating RN: Ahmed Prima [birth year] (83 y.o. Other Clinician: Date of Birth/Sex: Female) Treating ROBSON, MICHAEL Primary Care Jajuan Skoog: Emily Filbert Saifullah Jolley/Extender: G Referring Fatmata Legere: Melina Modena in Treatment: 0 Activities of Daily Living Items Answer Activities of Daily Living (Please select one for each item) Drive Automobile Not Able Take Medications Not Able Use Telephone Not Able Care for Appearance Not Able Use Toilet Not Able Manus Rudd / Shower Not Able Dress Self Not Able Feed Self Not Able Walk Not Able Get In / Out Bed Not Able Housework Not Able Prepare Meals Not Able Handle Money Not Able Shop for Self Not Able Electronic Signature(s) Signed:  01/04/2017 3:17:24 PM By: Alric Quan Entered By: Alric Quan on 01/04/2017 09:45:58 [name] ([id number]) -------------------------------------------------------------------------------- Education Assessment Details Patient Name: [name] Date of Service: 01/04/2017 9:30 AM Medical Record Patient Account Number: [id number] Number: Treating RN: Ahmed Prima [birth year] (83 y.o. Other Clinician: Date of Birth/Sex: Female) Treating ROBSON, MICHAEL Primary Care Kadelyn Dimascio: Emily Filbert Elexius Minar/Extender: G Referring Xaniyah Buchholz: Melina Modena in Treatment: 0 Primary Learner Assessed: Patient Learning Preferences/Education Level/Primary Language Learning Preference: Explanation, Printed Material Highest Education Level: High School Preferred Language: English Cognitive Barrier Assessment/Beliefs Language Barrier: No Translator Needed: No Memory Deficit: No Emotional Barrier: No Cultural/Religious Beliefs Affecting Medical No Care: Physical Barrier Assessment Impaired Vision: Yes Glasses Impaired Hearing: No Decreased Hand dexterity: No Knowledge/Comprehension Assessment Knowledge Level: Medium Comprehension Level: Medium Ability to understand written Medium instructions: Ability to understand verbal Medium instructions: Motivation Assessment Anxiety Level: Anxious Cooperation: Cooperative Education Importance: Acknowledges Need Interest in Health Problems: Asks Questions Perception: Coherent Willingness to Engage in Self- High Management Activities: Readiness to Engage in Self- High Management Activities: [name], [name] ([id number]) Electronic Signature(s) Signed: 01/04/2017 3:17:24 PM By: Alric Quan Entered By: Alric Quan on 01/04/2017 09:46:26 [name], [name] ([id number]) -------------------------------------------------------------------------------- Fall Risk Assessment Details Patient Name: [name] Date of  Service: 01/04/2017 9:30 AM Medical Record Patient Account Number: [id number] Number: Treating RN: Ahmed Prima 06-Oct-1934 (83 y.o. Other Clinician: Date of Birth/Sex: Female) Treating ROBSON, MICHAEL Primary Care Yecheskel Kurek: Emily Filbert Gaylen Venning/Extender: G Referring Brae Gartman: Melina Modena in Treatment: 0 Fall Risk Assessment Items Have you had 2 or more falls in the last 12 monthso 0 Yes Have you had any fall that resulted in injury in the last 12 monthso 0 Yes FALL RISK ASSESSMENT: History of falling - immediate or within 3 months 0 No Secondary diagnosis 15 Yes Ambulatory aid None/bed rest/wheelchair/nurse 0  Yes Crutches/cane/walker 0 No Furniture 0 No IV Access/Saline Lock 0 No Gait/Training Normal/bed rest/immobile 0 Yes Weak 10 Yes Impaired 20 Yes Mental Status Oriented to own ability 0 No Electronic Signature(s) Signed: 01/04/2017 3:17:24 PM By: Alric Quan Entered By: Alric Quan on 01/04/2017 09:47:13 [name] ([id number]) -------------------------------------------------------------------------------- Foot Assessment Details Patient Name: [name] Date of Service: 01/04/2017 9:30 AM Medical Record Patient Account Number: [id number] Number: Treating RN: Ahmed Prima August 18, 1934 (83 y.o. Other Clinician: Date of Birth/Sex: Female) Treating ROBSON, MICHAEL Primary Care Lavere Shinsky: Emily Filbert Amario Longmore/Extender: G Referring Brittiny Levitz: Melina Modena in Treatment: 0 Foot Assessment Items Site Locations + = Sensation present, - = Sensation absent, C = Callus, U = Ulcer R = Redness, W = Warmth, M = Maceration, PU = Pre-ulcerative lesion F = Fissure, S = Swelling, D = Dryness Assessment Right: Left: Other Deformity: No No Prior Foot Ulcer: No No Prior Amputation: No No Charcot Joint: No No Ambulatory Status: Non-ambulatory Assistance Device: Wheelchair Gait: Electronic Signature(s) Signed: 01/04/2017  3:17:24 PM By: Alric Quan Entered By: Alric Quan on 01/04/2017 09:48:44 [name] ([id number]) [name], [name] ([id number]) -------------------------------------------------------------------------------- Nutrition Risk Assessment Details Patient Name: [name] Date of Service: 01/04/2017 9:30 AM Medical Record Patient Account Number: [id number] Number: Treating RN: Ahmed Prima [birth year] (83 y.o. Other Clinician: Date of Birth/Sex: Female) Treating ROBSON, MICHAEL Primary Care Jasher Barkan: Emily Filbert Burgundy Matuszak/Extender: G Referring Carel Carrier: Melina Modena in Treatment: 0 Height (in): 61 Weight (lbs): 206 Body Mass Index (BMI): 38.9 Nutrition Risk Assessment Items NUTRITION RISK SCREEN: I have an illness or condition that made me change the kind and/or 2 Yes amount of food I eat I eat fewer than two meals per day 3 Yes I eat few fruits and vegetables, or milk products 0 No I have three or more drinks of beer, liquor or wine almost every day 0 No I have tooth or mouth problems that make it hard for me to eat 0 No I don't always have enough money to buy the food I need 0 No I eat alone most of the time 0 No I take three or more different prescribed or over-the-counter drugs a 1 Yes day Without wanting to, I have lost or gained 10 pounds in the last six 0 No months I am not always physically able to shop, cook and/or feed myself 2 Yes Nutrition Protocols Good Risk Protocol Moderate Risk Protocol Electronic Signature(s) Signed: 01/04/2017 3:17:24 PM By: Alric Quan Entered By: Alric Quan on 01/04/2017 09:47:28

## 2017-01-05 NOTE — Progress Notes (Signed)
Connie, Tucker ([id number]) Visit Report for 01/04/2017 Chief Complaint Document Details Patient Name: [name], [name] Date of Service: 01/04/2017 9:30 AM Medical Record Patient Account Number: [id number] Number: Treating RN: Ahmed Prima [birth year] (83 y.o. Other Clinician: Date of Birth/Sex: Female) Treating Annaston Upham Primary Care Provider: Emily Filbert Provider/Extender: G Referring Provider: Melina Modena in Treatment: 0 Information Obtained from: Patient Chief Complaint 01/04/17; patient is here for review of wounds on her bilateral lower extremities referred from her primary physician Dr. Emily Filbert [facility] Electronic Signature(s) Signed: 01/04/2017 4:44:13 PM By: Linton Ham MD Entered By: Linton Ham on 01/04/2017 12:25:06 [name] ([id number]) -------------------------------------------------------------------------------- Debridement Details Patient Name: [name] Date of Service: 01/04/2017 9:30 AM Medical Record Patient Account Number: [id number] Number: Treating RN: Ahmed Prima 04/10/1934 (83 y.o. Other Clinician: Date of Birth/Sex: Female) Treating Tyriek Hofman Primary Care Provider: Emily Filbert Provider/Extender: G Referring Provider: Melina Modena in Treatment: 0 Debridement Performed for Wound #1 Left,Dorsal Foot Assessment: Performed By: Physician Ricard Dillon, MD Debridement: Debridement Pre-procedure Yes - 10:23 Verification/Time Out Taken: Start Time: 10:25 Pain Control: Lidocaine 4% Topical Solution Level: Skin/Subcutaneous Tissue Total Area Debrided (L x 3 (cm) x 6 (cm) = 18 (cm) W): Tissue and other Viable, Non-Viable, Exudate, Fibrin/Slough, Subcutaneous material debrided: Instrument: Blade, Forceps Bleeding: Minimum Hemostasis Achieved: Pressure End Time: 10:27 Procedural Pain: 0 Post Procedural Pain: 0 Response to Treatment: Procedure was tolerated  well Post Debridement Measurements of Total Wound Length: (cm) 3 Width: (cm) 6 Depth: (cm) 0.3 Volume: (cm) 4.241 Character of Wound/Ulcer Post Requires Further Debridement Debridement: Severity of Tissue Post Debridement: Fat layer exposed Post Procedure Diagnosis Same as Pre-procedure Electronic Signature(s) Signed: 01/04/2017 3:17:24 PM By: Alric Quan Signed: 01/04/2017 4:44:13 PM By: Linton Ham MD [place], [name] ([id number]) Entered By: Linton Ham on 01/04/2017 12:23:46 [name] ([id number]) -------------------------------------------------------------------------------- Debridement Details Patient Name: [name] Date of Service: 01/04/2017 9:30 AM Medical Record Patient Account Number: [id number] Number: Treating RN: Ahmed Prima [birth year] (83 y.o. Other Clinician: Date of Birth/Sex: Female) Treating Champion Corales Primary Care Provider: Emily Filbert Provider/Extender: G Referring Provider: Melina Modena in Treatment: 0 Debridement Performed for Wound #2 Right,Dorsal Foot Assessment: Performed By: Physician Ricard Dillon, MD Debridement: Debridement Pre-procedure Yes - 10:23 Verification/Time Out Taken: Start Time: 10:24 Pain Control: Lidocaine 4% Topical Solution Level: Skin/Subcutaneous Tissue Total Area Debrided (L x 6 (cm) x 4 (cm) = 24 (cm) W): Tissue and other Viable, Non-Viable, Exudate, Fibrin/Slough, Subcutaneous material debrided: Instrument: Blade, Forceps Bleeding: Minimum Hemostasis Achieved: Pressure End Time: 10:25 Procedural Pain: 0 Post Procedural Pain: 0 Response to Treatment: Procedure was tolerated well Post Debridement Measurements of Total Wound Length: (cm) 6 Width: (cm) 4 Depth: (cm) 0.3 Volume: (cm) 5.655 Character of Wound/Ulcer Post Requires Further Debridement Debridement: Severity of Tissue Post Debridement: Fat layer exposed Post Procedure Diagnosis Same as  Pre-procedure Electronic Signature(s) Signed: 01/04/2017 3:17:24 PM By: Alric Quan Signed: 01/04/2017 4:44:13 PM By: Linton Ham MD [place] ([id number]) Entered By: Linton Ham on 01/04/2017 12:24:05 [name] ([id number]) -------------------------------------------------------------------------------- HPI Details Patient Name: [name] Date of Service: 01/04/2017 9:30 AM Medical Record Patient Account Number: [id number] Number: Treating RN: Ahmed Prima 09-Jun-1934 (83 y.o. Other Clinician: Date of Birth/Sex: Female) Treating Rithika Seel Primary Care Provider: Emily Filbert Provider/Extender: G Referring Provider: Melina Modena in Treatment: 0 History of Present Illness HPI Description: 01/04/17; this is a 83 year old  fairly disabled woman who comes accompanied by her husband today. She is here for review of wounds on her bilateral feet and right calf. There is a hopeful note from Dr. Sabra Heck that is included. The patient is a type II diabetic with end-stage renal failure on dialysis, she is here for bilateral wounds in her feet and heels as well as the right calf wound posteriorly. The history here is a bit difficult to follow. Her husband states that she had a fracture of her leg in October. Indeed [facility] she had a closed nondisplaced fracture of the lateral condyle of the right femur. This was managed nonoperatively with a knee brace. Her husband thinks that the brace itself caused the wounds on the right leg although that would not of caused ones on the left. Apparently the wounds have been there for 2 months and the patient is being followed by [facility] with bilateral Unna boots. She is also followed in Dr. Ozella Almond office of vascular surgery. She had recent arterial studies that showed biphasic waveforms bilaterally with a ABI on the right of 1.01 on the left at 0.93. T ABIs on the right at 0.72 and on  the left at 0.75. He was not felt to have significant arterial disease. I am also not clear what [facility] was primarily dressing the wounds with under the wraps the patient had an admission on 10/17 with an acute right leg DVT status post IVC filter placement and was placed on Eliquis for 6 weeks only. Electronic Signature(s) Signed: 01/04/2017 4:44:13 PM By: Linton Ham MD Entered By: Linton Ham on 01/04/2017 12:34:39 [name] ([id number]) -------------------------------------------------------------------------------- Physical Exam Details Patient Name: [name] Date of Service: 01/04/2017 9:30 AM Medical Record Patient Account Number: [id number] Number: Treating RN: Ahmed Prima 01-Oct-1934 (83 y.o. Other Clinician: Date of Birth/Sex: Female) Treating Elaysia Devargas Primary Care Provider: Emily Filbert Provider/Extender: G Referring Provider: Melina Modena in Treatment: 0 Constitutional Patient is hypertensive.. I believe controlled atrial fib. Respirations regular, non-labored and within target range.. Temperature is normal and within the target range for the patient.. Patient appears chronically ill. Eyes Conjunctivae clear. No discharge.Marland Kitchen Respiratory Respiratory effort is easy and symmetric bilaterally. Rate is normal at rest and on room air.. Shallow but otherwise clear. Cardiovascular Heart sounds are regular. 2 to 3/6 pansystolic murmur at the lower left sternal border probably TR. JVP is elevated at 90o. Pedal pulses palpable and strong bilaterally.. Edema present in both extremities. What appears to be chronic venous insufficiency with hemosiderin deposition her edema is well controlled with the current wound bruits. She also has what appears to be dark purpuric-looking lesions on both legs.. Gastrointestinal (GI) Obese, distended bowel sounds are positive. No liver or spleen enlargement or tenderness.. Lymphatic None  palpable in the popliteal or inguinal area. Musculoskeletal Bilateral total knee replacements. Neurological Reduced vibration sense and light touch. Psychiatric Patient appears depressed today.. Notes Exam; the patient has multitude of serious wounds. Including right dorsal foot medially, left dorsal foot medially, right heel calcaneus, left heel plantar and Achilles, 2 on the right upper anterior medial leg. All of these are covered with various degrees of eschar and necrotic tissue. The dorsal areas on both feet are debrided with scalpel and pickups removing black eschar and necrotic subcutaneous tissue. She was not transported into one of our exam table today therefore I could not get out the wounds on her bilateral heels or the right lateral leg. Electronic Signature(s)  Signed: 01/04/2017 4:44:13 PM By: Linton Ham MD Entered By: Linton Ham on 01/04/2017 12:39:28 [name] ([id number], YANKE ([id number]) -------------------------------------------------------------------------------- Physician Orders Details Patient Name: [name] Date of Service: 01/04/2017 9:30 AM Medical Record Patient Account Number: [id number] Number: Treating RN: Ahmed Prima 12-04-1934 (83 y.o. 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 o Cleanse wound with mild soap and water Wound #2 Right,Dorsal Foot o Clean wound with Normal Saline. o Cleanse wound with mild soap and water Wound #3 Left Calcaneus o Clean wound with Normal Saline. o Cleanse wound with mild soap and water Wound #4 Right Calcaneus o Clean wound with Normal Saline. o Cleanse wound with mild soap and water Wound #5 Right,Anterior Lower Leg o Clean wound with Normal Saline. o Cleanse wound with mild soap and water Wound #6 Right,Posterior Lower Leg [place], Jessicamarie R. ([id number]) o Clean wound with Normal Saline. o Cleanse wound with mild soap and water Anesthetic Wound #1 Left,Dorsal Foot o Topical Lidocaine 4% cream applied to wound bed prior to debridement - for clinic use only Wound #2 Right,Dorsal Foot o Topical Lidocaine 4% cream applied to wound bed prior to debridement - for clinic use only Wound #3 Left Calcaneus o Topical Lidocaine 4% cream applied to wound bed prior to debridement - for clinic use only Wound #4 Right Calcaneus o Topical Lidocaine 4% cream applied to wound bed prior to debridement - for clinic use only Wound #5 Right,Anterior Lower Leg o Topical Lidocaine 4% cream applied to wound bed prior to debridement - for clinic use only Wound #6 Right,Posterior Lower Leg o Topical Lidocaine 4% cream applied to wound bed prior to debridement - for clinic use only Primary Wound Dressing Wound #1 Left,Dorsal Foot o Santyl Ointment Wound #2 Right,Dorsal Foot o Santyl Ointment Wound #3 Left Calcaneus o Santyl Ointment Wound #4 Right Calcaneus o Santyl Ointment Wound #5 Right,Anterior Lower Leg o Prisma Ag Wound #6 Right,Posterior Lower Leg o Prisma Ag Secondary  Dressing Wound #1 Left,Dorsal Foot o ABD pad o Dry Gauze Wound #2 Right,Dorsal Foot o ABD pad Hua, Eliz R. ([id number]) o Dry Gauze Wound #3 Left Calcaneus o ABD pad o Dry Gauze Wound #4 Right Calcaneus o ABD pad o Dry Gauze Wound #5 Right,Anterior Lower Leg o ABD pad o Dry Gauze Wound #6 Right,Posterior Lower Leg o ABD pad o Dry Gauze Dressing Change Frequency Wound #1 Left,Dorsal Foot o Change dressing every week Wound #2 Right,Dorsal Foot o Change dressing every week Wound #3 Left Calcaneus o Change dressing every week Wound #4 Right Calcaneus o Change dressing every week Wound #5 Right,Anterior Lower Leg o Change dressing every week Wound #6 Right,Posterior Lower Leg o Change dressing every week Follow-up Appointments Wound #1 Left,Dorsal Foot o  Return Appointment in 1 week. Wound #2 Right,Dorsal Foot o Return Appointment in 1 week. Wound #3 Left Calcaneus o Return Appointment in 1 week. Wound #4 Right Calcaneus o Return Appointment in 1 week. KARLE, WILLENBRING ([id number]) Wound #5 Right,Anterior Lower Leg o Return Appointment in 1 week. Wound #6 Right,Posterior Lower Leg o Return Appointment in 1 week. Edema Control Wound #1 Left,Dorsal Foot o 3 Layer Compression System - Bilateral o Elevate legs to the level of the heart and pump ankles as often as possible Wound #2 Right,Dorsal Foot o 3 Layer Compression System - Bilateral o Elevate legs to the level of the heart and pump ankles as often as possible Wound #3 Left Calcaneus o 3 Layer Compression System - Bilateral o Elevate legs to the level of the heart and pump ankles as often as possible Wound #4 Right Calcaneus o 3 Layer Compression System - Bilateral o Elevate legs to the level of the heart and pump ankles as often as possible Wound #5 Right,Anterior Lower Leg o 3 Layer Compression System - Bilateral o Elevate legs to the level  of the heart and pump ankles as often as possible Wound #6 Right,Posterior Lower Leg o 3 Layer Compression System - Bilateral o Elevate legs to the level of the heart and pump ankles as often as possible Off-Loading Wound #1 Left,Dorsal Foot o Turn and reposition every 2 hours o Other: - float heels above heart level while in the bed Wound #2 Right,Dorsal Foot o Turn and reposition every 2 hours o Other: - float heels above heart level while in the bed Wound #3 Left Calcaneus o Turn and reposition every 2 hours o Other: - float heels above heart level while in the bed Wound #4 Right Calcaneus o Turn and reposition every 2 hours o Other: - float heels above heart level while in the bed Moss, Ninel R. ([id number]) Wound #5 Right,Anterior Lower Leg o Turn and reposition every 2 hours o Other: - float heels above heart level while in the bed Wound #6 Right,Posterior Lower Leg o Turn and reposition every 2 hours o Other: - float heels above heart level while in the bed Additional Orders / Instructions Wound #1 Left,Dorsal Foot o Increase protein intake. Wound #2 Right,Dorsal Foot o Increase protein intake. Wound #3 Left Calcaneus o Increase protein intake. Wound #4 Right Calcaneus o Increase protein intake. Wound #5 Right,Anterior Lower Leg o Increase protein intake. Wound #6 Right,Posterior Lower Leg o Increase protein intake. Home Health Wound #1 [place] Visits o Home Health Nurse may visit PRN to address patientos wound care needs. o FACE TO FACE ENCOUNTER: MEDICARE and MEDICAID PATIENTS: I certify that this patient is under my care and that I had a face-to-face encounter that meets the physician face-to-face encounter requirements with this patient on this date. The encounter with the patient was in whole or in part for the following MEDICAL CONDITION: (primary reason for [place]) MEDICAL  NECESSITY: I certify, that based on my findings, NURSING services are a medically necessary home health service. HOME BOUND STATUS: I certify that my clinical findings support that this patient is homebound (i.e., Due to illness or injury, pt requires aid of supportive devices such as crutches, cane, wheelchairs, walkers, the use of special transportation or the assistance of another person to leave their place of residence. There is a normal inability to leave the home and doing so requires considerable and taxing effort. Other absences are for  medical reasons / religious services and are infrequent or of short duration when for other reasons). o If current dressing causes regression in wound condition, may D/C ordered dressing product/s and apply Normal Saline Moist Dressing daily until next [place] / Other MD appointment. [place] of regression in wound condition at [phone]. o Please direct any NON-WOUND related issues/requests for orders to patient's Primary Care Physician VARVARA, WALLACE ([id number]) Wound #2 [place] Nurse may visit PRN to address patientos wound care needs. o FACE TO FACE ENCOUNTER: MEDICARE and MEDICAID PATIENTS: I certify that this patient is under my care and that I had a face-to-face encounter that meets the physician face-to-face encounter requirements with this patient on this date. The encounter with the patient was in whole or in part for the following MEDICAL CONDITION: (primary reason for [place]) MEDICAL NECESSITY: I certify, that based on my findings, NURSING services are a medically necessary home health service. HOME BOUND STATUS: I certify that my clinical findings support that this patient is homebound (i.e., Due to illness or injury, pt requires aid of supportive devices such as crutches, cane, wheelchairs, walkers, the use of  special transportation or the assistance of another person to leave their place of residence. There is a normal inability to leave the home and doing so requires considerable and taxing effort. Other absences are for medical reasons / religious services and are infrequent or of short duration when for other reasons). o If current dressing causes regression in wound condition, may D/C ordered dressing product/s and apply Normal Saline Moist Dressing daily until next [place] / Other MD appointment. [place] of regression in wound condition at [phone]. o Please direct any NON-WOUND related issues/requests for orders to patient's Primary Care Physician Wound #3 Left [place] Nurse may visit PRN to address patientos wound care needs. o FACE TO FACE ENCOUNTER: MEDICARE and MEDICAID PATIENTS: I certify that this patient is under my care and that I had a face-to-face encounter that meets the physician face-to-face encounter requirements with this patient on this date. The encounter with the patient was in whole or in part for the following MEDICAL CONDITION: (primary reason for [place]) MEDICAL NECESSITY: I certify, that based on my findings, NURSING services are a medically necessary home health service. HOME BOUND STATUS: I certify that my clinical findings support that this patient is homebound (i.e., Due to illness or injury, pt requires aid of supportive devices such as crutches, cane, wheelchairs, walkers, the use of special transportation or the assistance of another person to leave their place of residence. There is a normal inability to leave the home and doing so requires considerable and taxing effort. Other absences are for medical reasons / religious services and are infrequent or of short duration when for other reasons). o If current dressing causes regression in wound condition, may  D/C ordered dressing product/s and apply Normal Saline Moist Dressing daily until next [place] / Other MD appointment. [place] of regression in wound condition at [phone]. o Please direct any NON-WOUND related issues/requests for orders to patient's Primary Care Physician Wound #4 Right [place] Nurse may visit PRN to address patientos wound care needs. o FACE TO FACE ENCOUNTER: MEDICARE and MEDICAID PATIENTS: I certify that this patient is under my care and  that I had a face-to-face encounter that meets the physician face-to-face encounter requirements with this patient on this date. The encounter with the patient was in whole or in part for the following MEDICAL CONDITION: (primary reason for [place]) PETRONIA, JOICE ([id number]) MEDICAL NECESSITY: I certify, that based on my findings, NURSING services are a medically necessary home health service. HOME BOUND STATUS: I certify that my clinical findings support that this patient is homebound (i.e., Due to illness or injury, pt requires aid of supportive devices such as crutches, cane, wheelchairs, walkers, the use of special transportation or the assistance of another person to leave their place of residence. There is a normal inability to leave the home and doing so requires considerable and taxing effort. Other absences are for medical reasons / religious services and are infrequent or of short duration when for other reasons). o If current dressing causes regression in wound condition, may D/C ordered dressing product/s and apply Normal Saline Moist Dressing daily until next [place] / Other MD appointment. [place] of regression in wound condition at [phone]. o Please direct any NON-WOUND related issues/requests for orders to patient's Primary Care Physician Wound #5 [place] Nurse may visit PRN to address patientos wound care needs. o FACE TO FACE ENCOUNTER: MEDICARE and MEDICAID PATIENTS: I certify that this patient is under my care and that I had a face-to-face encounter that meets the physician face-to-face encounter requirements with this patient on this date. The encounter with the patient was in whole or in part for the following MEDICAL CONDITION: (primary reason for [place]) MEDICAL NECESSITY: I certify, that based on my findings, NURSING services are a medically necessary home health service. HOME BOUND STATUS: I certify that my clinical findings support that this patient is homebound (i.e., Due to illness or injury, pt requires aid of supportive devices such as crutches, cane, wheelchairs, walkers, the use of special transportation or the assistance of another person to leave their place of residence. There is a normal inability to leave the home and doing so requires considerable and taxing effort. Other absences are for medical reasons / religious services and are infrequent or of short duration when for other reasons). o If current dressing causes regression in wound condition, may D/C ordered dressing product/s and apply Normal Saline Moist Dressing daily until next [place] / Other MD appointment. [place] of regression in wound condition at [phone]. o Please direct any NON-WOUND related issues/requests for orders to patient's Primary Care Physician Wound #6 [place] Nurse may visit PRN to address patientos wound care needs. o FACE TO FACE ENCOUNTER: MEDICARE and MEDICAID PATIENTS: I certify that this patient is under my care and that I had a face-to-face encounter that meets the physician face-to-face encounter requirements with this patient on this date. The encounter with the patient was  in whole or in part for the following MEDICAL CONDITION: (primary reason for [place]) MEDICAL NECESSITY: I certify, that based on my findings, NURSING services are a medically necessary home health service. HOME BOUND STATUS: I certify that my clinical findings support that this patient is homebound (i.e., Due to illness or injury, pt requires aid of supportive devices such as crutches, cane, wheelchairs, walkers, the use of special transportation or the assistance of another person to leave their place of residence. There is  a normal inability to leave the home and doing so requires considerable and taxing effort. Other absences are for medical reasons / religious services and are infrequent or of short duration when for other reasons). AZALEE, PONDS R. ([id number]) o If current dressing causes regression in wound condition, may D/C ordered dressing product/s and apply Normal Saline Moist Dressing daily until next [place] / Other MD appointment. [place] of regression in wound condition at [phone]. o Please direct any NON-WOUND related issues/requests for orders to patient's Primary Care Physician Medications-please add to medication list. Wound #1 Left,Dorsal Foot o Other: - Vitamin C, Zinc, MVI Wound #2 Right,Dorsal Foot o Other: - Vitamin C, Zinc, MVI Wound #3 Left Calcaneus o Other: - Vitamin C, Zinc, MVI Wound #4 Right Calcaneus o Other: - Vitamin C, Zinc, MVI Wound #5 Right,Anterior Lower Leg o Other: - Vitamin C, Zinc, MVI Wound #6 Right,Posterior Lower Leg o Other: - Vitamin C, Zinc, MVI Radiology o X-ray, ankle - bilateral o X-ray, foot - bilateral Electronic Signature(s) Signed: 01/04/2017 3:17:24 PM By: Alric Quan Signed: 01/04/2017 4:44:13 PM By: Linton Ham MD Entered By: Alric Quan on 01/04/2017 10:59:46 [name]  ([id number]) -------------------------------------------------------------------------------- Problem List Details Patient Name: [name] Date of Service: 01/04/2017 9:30 AM Medical Record Patient Account Number: [id number] Number: Treating RN: Ahmed Prima May 28, 1934 (83 y.o. 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/04/2017 Yes muscle involvement without evidence of necrosis L97.515 Non-pressure chronic ulcer of other part of right foot with 01/04/2017 Yes muscle involvement without evidence of necrosis L89.613 Pressure ulcer of right heel, stage 3 01/04/2017 Yes L89.620 Pressure ulcer of left heel, unstageable 01/04/2017 Yes L97.213 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 Other Clinician: Date of Birth/Sex: Female) Treating Avrohom Mckelvin Primary Care Provider: Emily Filbert Provider/Extender: G Referring Provider: Melina Modena in Treatment: 0 Subjective Chief Complaint Information obtained from  Patient 01/04/17; patient is here for review of wounds on her bilateral lower extremities referred from her primary physician Dr. Emily Filbert [facility] History of Present Illness (HPI) 01/04/17; this is a 83 year old fairly disabled woman who comes accompanied by her husband today. She is here for review of wounds on her bilateral feet and right calf. There is a hopeful note from Dr. Sabra Heck that is included. The patient is a type II diabetic with end-stage renal failure on dialysis, she is here for bilateral wounds in her feet and heels as well as the right calf wound posteriorly. The history here is a bit difficult to follow. Her husband states that she had a fracture of her leg in October. Indeed looking through [facility] she had a closed nondisplaced fracture of the lateral condyle of the right femur. This was managed nonoperatively with a  knee brace. Her husband thinks that the brace itself caused the wounds on the right leg although that would not of caused ones on the left. Apparently the wounds have been there for 2 months and the patient is being followed by [facility] with bilateral Unna boots. She is also followed in Dr. Ozella Almond office of vascular surgery. She had recent arterial studies that showed biphasic waveforms bilaterally with a ABI on the right of 1.01 on the left at 0.93. T ABIs on the right at 0.72 and on the left at 0.75. He was not felt to have significant arterial disease. I am also not clear what [facility] was primarily dressing the wounds with under the wraps the patient had an admission on 10/17 with an acute right leg DVT status post IVC filter placement and was placed on Eliquis for 6 weeks only. Wound History Patient reportedly has not tested positive for osteomyelitis. Patient reportedly has had testing performed to evaluate circulation in the legs. Patient experiences the following problems associated with their wounds: infection,  swelling. Patient History Information obtained from Patient. Allergies penicillin ALABAMA, LLEWELYN ([id number]) Family History Cancer - Siblings, Heart Disease - Siblings, Kidney Disease - Siblings, No family history of Diabetes, Hereditary Spherocytosis, Hypertension, Lung Disease, Seizures, Stroke, Thyroid Problems, Tuberculosis. Social History Never smoker, Marital Status - Married, Alcohol Use - Never, Drug Use - No History, Caffeine Use - Daily. Medical History Respiratory Patient has history of Sleep Apnea Cardiovascular Patient has history of Arrhythmia - a-fib, Hypertension Gastrointestinal Patient has history of Cirrhosis Endocrine Patient has history of Type II Diabetes Genitourinary Patient has history of End Stage Renal Disease - dialysis Musculoskeletal Patient has history of Gout, Osteoarthritis Patient is treated with Insulin. Blood sugar is tested. Review of Systems (ROS) Constitutional Symptoms (General Health) The patient has no complaints or symptoms. Eyes Complains or has symptoms of Glasses / Contacts. Ear/Nose/Mouth/Throat vertigo thats recurrent Hematologic/Lymphatic The patient has no complaints or symptoms. Respiratory Complains or has symptoms of Shortness of Breath. Cardiovascular hyperlipidemia Gastrointestinal vascular ectasia of colon Immunological The patient has no complaints or symptoms. Integumentary (Skin) Complains or has symptoms of Wounds, psoriasis Neurologic The patient has no complaints or symptoms. Oncologic skin cancer Psychiatric The patient has no complaints or symptoms. MERILYN, FOBES ([id number]) Objective Constitutional Patient is hypertensive.. I believe controlled atrial fib. Respirations regular, non-labored and within target range.. Temperature is normal and within the target range for the patient.. Patient appears chronically ill. Vitals Time Taken: 9:34 AM, Height: 61 in, Source: Stated, Weight: 206 lbs, Source:  Stated, BMI: 38.9, Temperature: 97.7 F, Pulse: 75 bpm, Respiratory Rate: 16 breaths/min, Blood Pressure: 135/100 mmHg. Eyes Conjunctivae clear. No discharge.Marland Kitchen Respiratory Respiratory effort is easy and symmetric bilaterally. Rate is normal at rest and on room air.. Shallow but otherwise clear. Cardiovascular Heart sounds are regular. 2 to 3/6 pansystolic murmur at the lower left sternal border probably TR. JVP is elevated at 90. Pedal pulses palpable and strong bilaterally.. Edema present in both extremities. What appears to be chronic venous insufficiency with hemosiderin deposition her edema is well controlled with the current wound bruits. She also has what appears to be dark purpuric-looking lesions on both legs.. Gastrointestinal (GI) Obese, distended bowel sounds are positive. No liver or spleen enlargement or tenderness.. Lymphatic None palpable in the popliteal or inguinal area. Musculoskeletal Bilateral total knee replacements. Neurological Reduced vibration sense and light touch. Psychiatric Patient appears depressed today.. General Notes: Exam; the patient has multitude of  serious wounds. Including right dorsal foot medially, left dorsal foot medially, right heel calcaneus, left heel plantar and Achilles, 2 on the right upper anterior medial leg. All of these are covered with various degrees of eschar and necrotic tissue. The dorsal areas on both feet are debrided with scalpel and pickups removing black eschar and necrotic subcutaneous tissue. She was not transported into one of our exam table today therefore I could not get out the wounds on her AUNYE, NANNEY R. ([id number]) bilateral heels or the right lateral leg. Integumentary (Hair, Skin) Wound #1 status is Open. Original cause of wound was Gradually Appeared. The wound is located on the Left,Dorsal Foot. The wound measures 3cm length x 6cm width x 0.2cm depth; 14.137cm^2 area and 2.827cm^3 volume. The wound is  limited to skin breakdown. There is no tunneling noted. There is a large amount of serosanguineous drainage noted. The wound margin is distinct with the outline attached to the wound base. There is no granulation within the wound bed. There is a large (67-100%) amount of necrotic tissue within the wound bed including Eschar. The periwound skin appearance exhibited: Erythema. The surrounding wound skin color is noted with erythema which is circumferential. Periwound temperature was noted as No Abnormality. The periwound has tenderness on palpation. Wound #2 status is Open. Original cause of wound was Gradually Appeared. The wound is located on the Right,Dorsal Foot. The wound measures 6cm length x 4cm width x 0.2cm depth; 18.85cm^2 area and 3.77cm^3 volume. The wound is limited to skin breakdown. There is no tunneling or undermining noted. There is a large amount of serosanguineous drainage noted. The wound margin is distinct with the outline attached to the wound base. There is small (1-33%) pink granulation within the wound bed. There is a large (67-100%) amount of necrotic tissue within the wound bed including Eschar. The periwound skin appearance exhibited: Maceration, Erythema. The surrounding wound skin color is noted with erythema which is circumferential. Periwound temperature was noted as No Abnormality. The periwound has tenderness on palpation. Wound #3 status is Open. Original cause of wound was Pressure Injury. The wound is located on the Left Calcaneus. The wound measures 4cm length x 9cm width x 0.1cm depth; 28.274cm^2 area and 2.827cm^3 volume. There is no tunneling or undermining noted. There is a large amount of serous drainage noted. The wound margin is distinct with the outline attached to the wound base. There is no granulation within the wound bed. There is a large (67-100%) amount of necrotic tissue within the wound bed including Eschar. The periwound skin appearance  exhibited: Erythema. The surrounding wound skin color is noted with erythema which is circumferential. Periwound temperature was noted as No Abnormality. The periwound has tenderness on palpation. Wound #4 status is Open. Original cause of wound was Pressure Injury. The wound is located on the Right Calcaneus. The wound measures 5cm length x 5cm width x 0.2cm depth; 19.635cm^2 area and 3.927cm^3 volume. There is no tunneling or undermining noted. There is a large amount of serous drainage noted. The wound margin is distinct with the outline attached to the wound base. There is no granulation within the wound bed. There is a large (67-100%) amount of necrotic tissue within the wound bed including Eschar and Adherent Slough. The periwound skin appearance exhibited: Erythema. The surrounding wound skin color is noted with erythema which is circumferential. Periwound temperature was noted as No Abnormality. The periwound has tenderness on palpation. Wound #5 status is Open. Original cause of wound was  Gradually Appeared. The wound is located on the Right,Anterior Lower Leg. The wound measures 2cm length x 0.5cm width x 0.1cm depth; 0.785cm^2 area and 0.079cm^3 volume. There is no tunneling or undermining noted. There is a large amount of serosanguineous drainage noted. The wound margin is distinct with the outline attached to the wound base. There is large (67-100%) red granulation within the wound bed. There is no necrotic tissue within the wound bed. Periwound temperature was noted as No Abnormality. The periwound has tenderness on palpation. Wound #6 status is Open. Original cause of wound was Gradually Appeared. The wound is located on the Right,Posterior Lower Leg. The wound measures 6.5cm length x 2cm width x 1.5cm depth; 10.21cm^2 area and 15.315cm^3 volume. There is no tunneling or undermining noted. There is a large amount of serosanguineous drainage noted. The wound margin is distinct with  the outline attached to the wound base. LACHLAN, BROLL R. ([id number]) There is no granulation within the wound bed. There is a large (67-100%) amount of necrotic tissue within the wound bed including Adherent Slough. The periwound skin appearance exhibited: Maceration, Erythema. The surrounding wound skin color is noted with erythema which is circumferential. Periwound temperature was noted as No Abnormality. The periwound has tenderness on palpation. 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 There was a Skin/Subcutaneous Tissue Debridement [id number] debridement with total area of 18 sq cm performed by Ricard Dillon, MD. with the following instrument(s): Blade and Forceps to remove Viable and Non-Viable tissue/material including Exudate, Fibrin/Slough, and Subcutaneous after achieving pain control using Lidocaine 4% Topical Solution. A time out was conducted at 10:23, prior to the start of the procedure. A Minimum amount of bleeding was controlled with Pressure. The procedure was tolerated well with a pain level of 0 throughout and a pain level of 0 following the procedure. Post Debridement Measurements: 3cm length x 6cm width x 0.3cm depth; 4.241cm^3 volume. Character of Wound/Ulcer Post Debridement requires further debridement. Severity of Tissue Post Debridement  is: Fat layer exposed. Post procedure Diagnosis Wound #1: Same as Pre-Procedure Wound #2 Wound #2 is a Diabetic Wound/Ulcer of the Lower Extremity located on the Right,Dorsal Foot . There was a Skin/Subcutaneous Tissue Debridement [id number]) debridement with total area of 24 sq cm performed by Ricard Dillon, MD. with the following instrument(s): Blade and Forceps to remove Viable and Non-Viable tissue/material including Exudate, Fibrin/Slough, and Subcutaneous after achieving pain control using Lidocaine 4% Topical Solution. A time out was conducted at 10:23, prior to the start of the procedure. [place] ([id number]) A Minimum amount of bleeding was controlled with Pressure. The procedure was tolerated well with a pain level of 0 throughout and a pain level of 0 following the procedure. Post Debridement Measurements: 6cm length x 4cm width x 0.3cm depth; 5.655cm^3 volume. Character of Wound/Ulcer Post Debridement requires further debridement. Severity of Tissue Post Debridement is: Fat layer exposed. Post procedure Diagnosis Wound #  2: Same as Pre-Procedure Plan Wound Cleansing: Wound #1 Left,Dorsal Foot: Clean wound with Normal Saline. Cleanse wound with mild soap and water Wound #2 Right,Dorsal Foot: Clean wound with Normal Saline. Cleanse wound with mild soap and water Wound #3 Left Calcaneus: Clean wound with Normal Saline. Cleanse wound with mild soap and water Wound #4 Right Calcaneus: Clean wound with Normal Saline. Cleanse wound with mild soap and water Wound #5 Right,Anterior Lower Leg: Clean wound with Normal Saline. Cleanse wound with mild soap and water Wound #6 Right,Posterior Lower Leg: Clean wound with Normal Saline. Cleanse wound with mild soap and water Anesthetic: Wound #1 Left,Dorsal Foot: Topical Lidocaine 4% cream applied to wound bed prior to debridement - for clinic use only Wound #2 Right,Dorsal Foot: Topical Lidocaine 4% cream applied to  wound bed prior to debridement - for clinic use only Wound #3 Left Calcaneus: Topical Lidocaine 4% cream applied to wound bed prior to debridement - for clinic use only Wound #4 Right Calcaneus: Topical Lidocaine 4% cream applied to wound bed prior to debridement - for clinic use only Wound #5 Right,Anterior Lower Leg: Topical Lidocaine 4% cream applied to wound bed prior to debridement - for clinic use only Wound #6 Right,Posterior Lower Leg: Topical Lidocaine 4% cream applied to wound bed prior to debridement - for clinic use only Primary Wound Dressing: Wound #1 Left,Dorsal Foot: Santyl Ointment Wound #2 Right,Dorsal Foot: KILEIGH, TREGONING ([id number]) Santyl Ointment Wound #3 Left Calcaneus: Santyl Ointment Wound #4 Right Calcaneus: Santyl Ointment Wound #5 Right,Anterior Lower Leg: Prisma Ag Wound #6 Right,Posterior Lower Leg: Prisma Ag Secondary Dressing: Wound #1 Left,Dorsal Foot: ABD pad Dry Gauze Wound #2 Right,Dorsal Foot: ABD pad Dry Gauze Wound #3 Left Calcaneus: ABD pad Dry Gauze Wound #4 Right Calcaneus: ABD pad Dry Gauze Wound #5 Right,Anterior Lower Leg: ABD pad Dry Gauze Wound #6 Right,Posterior Lower Leg: ABD pad Dry Gauze Dressing Change Frequency: Wound #1 Left,Dorsal Foot: Change dressing every week Wound #2 Right,Dorsal Foot: Change dressing every week Wound #3 Left Calcaneus: Change dressing every week Wound #4 Right Calcaneus: Change dressing every week Wound #5 Right,Anterior Lower Leg: Change dressing every week Wound #6 Right,Posterior Lower Leg: Change dressing every week Follow-up Appointments: Wound #1 Left,Dorsal Foot: Return Appointment in 1 week. Wound #2 Right,Dorsal Foot: Return Appointment in 1 week. Wound #3 Left Calcaneus: Return Appointment in 1 week. Wound #4 Right Calcaneus: Return Appointment in 1 week. Wound #5 Right,Anterior Lower Leg: TYE, TRENHOLM R. ([id number]) Return Appointment in 1 week. Wound #6  Right,Posterior Lower Leg: Return Appointment in 1 week. Edema Control: Wound #1 Left,Dorsal Foot: 3 Layer Compression System - Bilateral Elevate legs to the level of the heart and pump ankles as often as possible Wound #2 Right,Dorsal Foot: 3 Layer Compression System - Bilateral Elevate legs to the level of the heart and pump ankles as often as possible Wound #3 Left Calcaneus: 3 Layer Compression System - Bilateral Elevate legs to the level of the heart and pump ankles as often as possible Wound #4 Right Calcaneus: 3 Layer Compression System - Bilateral Elevate legs to the level of the heart and pump ankles as often as possible Wound #5 Right,Anterior Lower Leg: 3 Layer Compression System - Bilateral Elevate legs to the level of the heart and pump ankles as often as possible Wound #6 Right,Posterior Lower Leg: 3 Layer Compression System - Bilateral Elevate legs to the level of the heart and pump ankles as often as possible Off-Loading: Wound #1 Left,Dorsal  Foot: Turn and reposition every 2 hours Other: - float heels above heart level while in the bed Wound #2 Right,Dorsal Foot: Turn and reposition every 2 hours Other: - float heels above heart level while in the bed Wound #3 Left Calcaneus: Turn and reposition every 2 hours Other: - float heels above heart level while in the bed Wound #4 Right Calcaneus: Turn and reposition every 2 hours Other: - float heels above heart level while in the bed Wound #5 Right,Anterior Lower Leg: Turn and reposition every 2 hours Other: - float heels above heart level while in the bed Wound #6 Right,Posterior Lower Leg: Turn and reposition every 2 hours Other: - float heels above heart level while in the bed Additional Orders / Instructions: Wound #1 Left,Dorsal Foot: Increase protein intake. Wound #2 Right,Dorsal Foot: Increase protein intake. Wound #3 Left Calcaneus: Increase protein intake. Wound #4 Right Calcaneus: Increase protein  intake. Wound #5 Right,Anterior Lower Leg: Clouse, Nhi R. ([id number]) Increase protein intake. Wound #6 Right,Posterior Lower Leg: Increase protein intake. Home Health: Wound #1 Left,Dorsal Foot: [place] Nurse may visit PRN to address patient s wound care needs. FACE TO FACE ENCOUNTER: MEDICARE and MEDICAID PATIENTS: I certify that this patient is under my care and that I had a face-to-face encounter that meets the physician face-to-face encounter requirements with this patient on this date. The encounter with the patient was in whole or in part for the following MEDICAL CONDITION: (primary reason for [place]) MEDICAL NECESSITY: I certify, that based on my findings, NURSING services are a medically necessary home health service. HOME BOUND STATUS: I certify that my clinical findings support that this patient is homebound (i.e., Due to illness or injury, pt requires aid of supportive devices such as crutches, cane, wheelchairs, walkers, the use of special transportation or the assistance of another person to leave their place of residence. There is a normal inability to leave the home and doing so requires considerable and taxing effort. Other absences are for medical reasons / religious services and are infrequent or of short duration when for other reasons). If current dressing causes regression in wound condition, may D/C ordered dressing product/s and apply Normal Saline Moist Dressing daily until next [place] / Other MD appointment. [place] of regression in wound condition at [phone]. Please direct any NON-WOUND related issues/requests for orders to patient's Primary Care Physician Wound #2 Right,Dorsal Foot: [place] Nurse may visit PRN to address patient s wound care needs. FACE TO FACE ENCOUNTER: MEDICARE and MEDICAID PATIENTS: I certify that this patient is under my care  and that I had a face-to-face encounter that meets the physician face-to-face encounter requirements with this patient on this date. The encounter with the patient was in whole or in part for the following MEDICAL CONDITION: (primary reason for [place]) MEDICAL NECESSITY: I certify, that based on my findings, NURSING services are a medically necessary home health service. HOME BOUND STATUS: I certify that my clinical findings support that this patient is homebound (i.e., Due to illness or injury, pt requires aid of supportive devices such as crutches, cane, wheelchairs, walkers, the use of special transportation or the assistance of another person to leave their place of residence. There is a normal inability to leave the home and doing so requires considerable and taxing effort. Other absences are for medical reasons / religious services and are infrequent or of short duration when for  other reasons). If current dressing causes regression in wound condition, may D/C ordered dressing product/s and apply Normal Saline Moist Dressing daily until next [place] / Other MD appointment. [place] of regression in wound condition at [phone]. Please direct any NON-WOUND related issues/requests for orders to patient's Primary Care Physician Wound #3 Left Calcaneus: [place] Nurse may visit PRN to address patient s wound care needs. FACE TO FACE ENCOUNTER: MEDICARE and MEDICAID PATIENTS: I certify that this patient is under my care and that I had a face-to-face encounter that meets the physician face-to-face encounter requirements with this patient on this date. The encounter with the patient was in whole or in part for the following MEDICAL CONDITION: (primary reason for [place]) MEDICAL NECESSITY: I certify, that based on my findings, NURSING services are a medically necessary home health service. HOME BOUND STATUS: I  certify that my clinical findings support that this patient is homebound (i.e., Due to illness or injury, pt requires aid of supportive devices such as crutches, cane, wheelchairs, walkers, the use of special transportation or the assistance of another person to leave their place of residence. There is a normal inability to leave the home and doing so requires considerable and taxing effort. Other absences are for medical reasons / religious services and are infrequent or of short duration when for other reasons). SARIKA, HARGETT R. ([id number]) If current dressing causes regression in wound condition, may D/C ordered dressing product/s and apply Normal Saline Moist Dressing daily until next [place] / Other MD appointment. [place] of regression in wound condition at [phone]. Please direct any NON-WOUND related issues/requests for orders to patient's Primary Care Physician Wound #4 Right Calcaneus: [place] Nurse may visit PRN to address patient s wound care needs. FACE TO FACE ENCOUNTER: MEDICARE and MEDICAID PATIENTS: I certify that this patient is under my care and that I had a face-to-face encounter that meets the physician face-to-face encounter requirements with this patient on this date. The encounter with the patient was in whole or in part for the following MEDICAL CONDITION: (primary reason for [place]) MEDICAL NECESSITY: I certify, that based on my findings, NURSING services are a medically necessary home health service. HOME BOUND STATUS: I certify that my clinical findings support that this patient is homebound (i.e., Due to illness or injury, pt requires aid of supportive devices such as crutches, cane, wheelchairs, walkers, the use of special transportation or the assistance of another person to leave their place of residence. There is a normal inability to leave the home and doing so requires considerable and  taxing effort. Other absences are for medical reasons / religious services and are infrequent or of short duration when for other reasons). If current dressing causes regression in wound condition, may D/C ordered dressing product/s and apply Normal Saline Moist Dressing daily until next [place] / Other MD appointment. [place] of regression in wound condition at [phone]. Please direct any NON-WOUND related issues/requests for orders to patient's Primary Care Physician Wound #5 Right,Anterior Lower Leg: [place] Nurse may visit PRN to address patient s wound care needs. FACE TO FACE ENCOUNTER: MEDICARE and MEDICAID PATIENTS: I certify that this patient is under my care and that I had a face-to-face encounter that meets the physician face-to-face encounter requirements with this patient on this date. The encounter with the patient was  in whole or in part for the following MEDICAL CONDITION: (primary reason for Home Healthcare) MEDICAL NECESSITY: I certify, that based on my findings, NURSING services are a medically necessary home health service. HOME BOUND STATUS: I certify that my clinical findings support that this patient is homebound (i.e., Due to illness or injury, pt requires aid of supportive devices such as crutches, cane, wheelchairs, walkers, the use of special transportation or the assistance of another person to leave their place of residence. There is a normal inability to leave the home and doing so requires considerable and taxing effort. Other absences are for medical reasons / religious services and are infrequent or of short duration when for other reasons). If current dressing causes regression in wound condition, may D/C ordered dressing product/s and apply Normal Saline Moist Dressing daily until next [place] / Other MD appointment. [place] of regression in wound condition at  [phone]. Please direct any NON-WOUND related issues/requests for orders to patient's Primary Care Physician Wound #6 Right,Posterior Lower Leg: [place] Nurse may visit PRN to address patient s wound care needs. FACE TO FACE ENCOUNTER: MEDICARE and MEDICAID PATIENTS: I certify that this patient is under my care and that I had a face-to-face encounter that meets the physician face-to-face encounter requirements with this patient on this date. The encounter with the patient was in whole or in part for the following MEDICAL CONDITION: (primary reason for [place]) MEDICAL NECESSITY: I certify, that based on my findings, NURSING services are a medically necessary home health service. HOME BOUND STATUS: I certify that my clinical findings support that this patient is homebound (i.e., Due to illness or injury, pt requires aid of supportive devices such as crutches, cane, wheelchairs, walkers, the use of special transportation or the assistance of another person to leave their place of residence. There is a normal inability to leave the home and doing so requires considerable and taxing effort. Other absences are for medical reasons / religious services and are infrequent or of short duration when for other reasons). NYELLI, SKELLENGER R. ([id number]) If current dressing causes regression in wound condition, may D/C ordered dressing product/s and apply Normal Saline Moist Dressing daily until next [place] / Other MD appointment. [place] of regression in wound condition at [phone]. Please direct any NON-WOUND related issues/requests for orders to patient's Primary Care Physician Medications-please add to medication list.: Wound #1 Left,Dorsal Foot: Other: - Vitamin C, Zinc, MVI Wound #2 Right,Dorsal Foot: Other: - Vitamin C, Zinc, MVI Wound #3 Left Calcaneus: Other: - Vitamin C, Zinc, MVI Wound #4 Right Calcaneus: Other: -  Vitamin C, Zinc, MVI Wound #5 Right,Anterior Lower Leg: Other: - Vitamin C, Zinc, MVI Wound #6 Right,Posterior Lower Leg: Other: - Vitamin C, Zinc, MVI Radiology ordered were: X-ray, ankle - bilateral, X-ray, foot - bilateral #1 extensive wounds over the dorsal feet and on her bilateral heels. There is a lot of tissue loss here. No overt infection and her perfusion seems adequate. The patient is concerned about limb loss and I talked with her frankly about this as long as she does not get infected I think there is room to work with the wounds here given her normal perfusion. If these do get infected then certainly limb loss is possible. #2 the exact pathogenesis of the wounds isn't really clear. Her husband states that the brace they put on her right leg causes some of the Santyl I'm  not exactly sure what happened on the left. I'm going to x-ray both feet and ankles looking for osteomyelitis under the extensive wounds #3 the patient has had recent arterial studies that indicate adequate perfusion. There was not felt to be significant lower extremity arterial disease. Her toe brachial index were normal. This was on 12/24/16 #4 patient clearly has evidence of right-sided heart failure probably pulmonary hypertension. She has recently had her diuretics increased by Dr. Sabra Heck which I think was appropriate. #4 the patient has chronic venous insufficiency probably some degree of lymphedema. She has significant purpuric areas on her bilateral lower extremities which is probably hemosiderin deposition from venous insufficiency but I would wonder about a primary skin condition. I'm certainly not going to biopsy these at the moment. CATHER, TORGERSON R. ([id number]) #5 applied Santyl to the wounds on the dorsal feet and heels. Silver collagen to the areas on the right lateral calf. Profore light compression. I'm going to attempt to leave these were ALL WEEK AS THE PATIENT REALLY COULD NOT AFFORD A SANTYL  copay Electronic Signature(s) Signed: 01/04/2017 4:44:13 PM By: Linton Ham MD Entered By: Linton Ham on 01/04/2017 12:43:54 [name] ([id number]) -------------------------------------------------------------------------------- ROS/PFSH Details Patient Name: [name] Date of Service: 01/04/2017 9:30 AM Medical Record Patient Account Number: [id number] Number: Treating RN: Ahmed Prima September 02, 1934 (83 y.o. Other Clinician: Date of Birth/Sex: Female) Treating Nella Botsford Primary Care Provider: Emily Filbert Provider/Extender: G Referring Provider: Melina Modena in Treatment: 0 Information Obtained From Patient Wound History Do you currently have one or more open woundso Yes Approximately how long have you had your woundso 26months How have you been treating your wound(s) until nowo unna wraps Has your wound(s) ever healed and then re-openedo No Have you had any lab work done in the past montho No Have you tested positive for an antibiotic resistant organism (MRSA, VRE)o Yes Date: 06/23/2016 Have you tested positive for osteomyelitis (bone infection)o No Have you had any tests for circulation on your legso Yes Who ordered the testo Dr, Lucky Cowboy Where was the test doneo [facility] Have you had other problems associated with your woundso Infection, Swelling Eyes Complaints and Symptoms: Positive for: Glasses / Contacts Respiratory Complaints and Symptoms: Positive for: Shortness of Breath Medical History: Positive for: Sleep Apnea Integumentary (Skin) Complaints and Symptoms: Positive for: Wounds Review of System Notes: psoriasis Constitutional Symptoms (General Health) PAGAN, LOVEALL R. ([id number]) Complaints and Symptoms: No Complaints or Symptoms Ear/Nose/Mouth/Throat Complaints and Symptoms: Review of System Notes: vertigo thats recurrent Hematologic/Lymphatic Complaints and Symptoms: No Complaints or Symptoms Cardiovascular Complaints  and Symptoms: Review of System Notes: hyperlipidemia Medical History: Positive for: Arrhythmia - a-fib; Hypertension Gastrointestinal Complaints and Symptoms: Review of System Notes: vascular ectasia of colon Medical History: Positive for: Cirrhosis Endocrine Medical History: Positive for: Type II Diabetes Treated with: Insulin Blood sugar tested every day: Yes Tested : 2x a day Genitourinary Medical History: Positive for: End Stage Renal Disease - dialysis Immunological Complaints and Symptoms: No Complaints or Symptoms Musculoskeletal MAKISHA, MESKER ([id number]) Medical History: Positive for: Gout; Osteoarthritis Neurologic Complaints and Symptoms: No Complaints or Symptoms Oncologic Complaints and Symptoms: Review of System Notes: skin cancer Psychiatric Complaints and Symptoms: No Complaints or Symptoms Immunizations Pneumococcal Vaccine: Received Pneumococcal Vaccination: Yes Family and Social History Cancer: Yes - Siblings; Diabetes: No; Heart Disease: Yes - Siblings; Hereditary Spherocytosis: No; Hypertension: No; Kidney Disease: Yes - Siblings; Lung Disease: No; Seizures: No; Stroke: No; Thyroid Problems: No; Tuberculosis: No; Never smoker;  Marital Status - Married; Alcohol Use: Never; Drug Use: No History; Caffeine Use: Daily; Financial Concerns: No; Food, Clothing or Shelter Needs: No; Support System Lacking: No; Transportation Concerns: No; Advanced Directives: No; Patient does not want information on Advanced Directives; Do not resuscitate: No; Living Will: No; Medical Power of Attorney: No Electronic Signature(s) Signed: 01/04/2017 3:17:24 PM By: Alric Quan Signed: 01/04/2017 4:44:13 PM By: Linton Ham MD Entered By: Alric Quan on 01/04/2017 09:45:22 BRICHELLE, DARRIN ([id number]) -------------------------------------------------------------------------------- [place] Details Patient Name: [name] Date of Service:  01/04/2017 Medical Record Patient Account Number: [id number] Number: Treating RN: Ahmed Prima 1934/08/18 (83 y.o. 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 without L97.525 evidence of necrosis Non-pressure chronic ulcer of other part of right foot with muscle involvement without L97.515 evidence of necrosis L89.613 Pressure ulcer of right heel, stage 3 L89.620 Pressure ulcer of left heel, unstageable 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 Facility Procedures CPT4: Description Modifier Quantity Code [id number] - WOUND CARE VISIT-LEV 3 EST PT 1 CPT4: [id number] - DEB SUBQ TISSUE 20 SQ CM/< 1 ICD-10 Description Diagnosis L97.525 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 without evidence of necrosis L97.515 Non-pressure chronic ulcer of other part of right  foot with muscle involvement without evidence of necrosis CPT4: [id number] - DEB SUBQ TISS EA ADDL 20CM 2 ICD-10 Description Diagnosis L97.525 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

## 2017-01-05 NOTE — Progress Notes (Addendum)
[name] ([id number]) Visit Report for 01/04/2017 Allergy List Details Patient Name: [name] Date of Service: 01/04/2017 9:30 AM Medical Record Patient Account Number: [id number] Number: Treating RN: Ahmed Prima 19-May-1934 (83 y.o. Other Clinician: Date of Birth/Sex: Female) Treating ROBSON, MICHAEL Primary Care Sakina Briones: Emily Filbert Carol Theys/Extender: G Referring Ashliegh Parekh: Emily Filbert Weeks in Treatment: 0 Allergies Active Allergies penicillin Allergy Notes Electronic Signature(s) Signed: 01/04/2017 3:17:24 PM By: Alric Quan Entered By: Alric Quan on 01/04/2017 09:36:03 [name] ([id number]) -------------------------------------------------------------------------------- Arrival Information Details Patient Name: [name] Date of Service: 01/04/2017 9:30 AM Medical Record Patient Account Number: [id number] Number: Treating RN: Ahmed Prima 04-30-1934 (83 y.o. Other Clinician: Date of Birth/Sex: Female) Treating ROBSON, MICHAEL Primary Care Sheryl Saintil: Emily Filbert Dorrie Cocuzza/Extender: G Referring Bellamy Rubey: Melina Modena in Treatment: 0 Visit Information Patient Arrived: Wheel Chair Arrival Time: 09:28 Accompanied By: husband Transfer Assistance: Civil Service fast streamer Patient Identification Verified: Yes Secondary Verification Process Yes Completed: Patient Requires Transmission-Based No Precautions: Patient Has Alerts: Yes Patient Alerts: DM II Electronic Signature(s) Signed: 01/04/2017 3:17:24 PM By: Alric Quan Entered By: Alric Quan on 01/04/2017 09:28:52 [name] ([id number]) -------------------------------------------------------------------------------- Clinic Level of Care Assessment Details Patient Name: [name] Date of Service: 01/04/2017 9:30 AM Medical Record Patient Account Number: [id number] Number: Treating RN: Ahmed Prima 13-Apr-1934 (83 y.o. Other  Clinician: Date of Birth/Sex: Female) Treating ROBSON, MICHAEL Primary Care Terrace Chiem: Emily Filbert Alverta Caccamo/Extender: G Referring Aadyn Buchheit: Melina Modena in Treatment: 0 Clinic Level of Care Assessment Items TOOL 1 Quantity Score X - Use when EandM and Procedure is performed on INITIAL visit 1 0 ASSESSMENTS - Nursing Assessment / Reassessment X - General Physical Exam (combine w/ comprehensive assessment (listed just 1 20 below) when performed on new pt. evals) X - Comprehensive Assessment (HX, ROS, Risk Assessments, Wounds Hx, etc.) 1 25 ASSESSMENTS - Wound and Skin Assessment / Reassessment []  - Dermatologic / Skin Assessment (not related to wound area) 0 ASSESSMENTS - Ostomy and/or Continence Assessment and Care []  - Incontinence Assessment and Management 0 []  - Ostomy Care Assessment and Management (repouching, etc.) 0 PROCESS - Coordination of Care []  - Simple Patient / Family Education for ongoing care 0 X - Complex (extensive) Patient / Family Education for ongoing care 1 20 X - Staff obtains Programmer, systems, Records, Test Results / Process Orders 1 10 X - Staff telephones HHA, Nursing Homes / Clarify orders / etc 1 10 []  - Routine Transfer to another Facility (non-emergent condition) 0 []  - Routine Hospital Admission (non-emergent condition) 0 X - New Admissions / Biomedical engineer / Ordering NPWT, Apligraf, etc. 1 15 []  - Emergency Hospital Admission (emergent condition) 0 PROCESS - Special Needs []  - Pediatric / Minor Patient Management 0 [name], Connie R. ([id number]) []  - Isolation Patient Management 0 []  - Hearing / Language / Visual special needs 0 []  - Assessment of Community assistance (transportation, D/C planning, etc.) 0 []  - Additional assistance / Altered mentation 0 []  - Support Surface(s) Assessment (bed, cushion, seat, etc.) 0 INTERVENTIONS - Miscellaneous []  - External ear exam 0 []  - Patient Transfer (multiple staff / Civil Service fast streamer / Similar devices)  0 []  - Simple Staple / Suture removal (25 or less) 0 []  - Complex Staple / Suture removal (26 or more) 0 []  - Hypo/Hyperglycemic Management (do not check if billed separately) 0 []  - Ankle / Brachial Index (ABI) - do not check if billed separately 0 Has the patient been seen at the  hospital within the last three years: Yes Total Score: 100 Level Of Care: New/Established - Level 3 Electronic Signature(s) Signed: 01/04/2017 3:17:24 PM By: Alric Quan Entered By: Alric Quan on 01/04/2017 13:01:11 [name] ([id number]) -------------------------------------------------------------------------------- Encounter Discharge Information Details Patient Name: [name] Date of Service: 01/04/2017 9:30 AM Medical Record Patient Account Number: [id number] Number: Treating RN: Ahmed Prima [birth year] (83 y.o. Other Clinician: Date of Birth/Sex: Female) Treating ROBSON, MICHAEL Primary Care Sandeep Radell: Emily Filbert Emyah Roznowski/Extender: G Referring Mahima Hottle: Melina Modena in Treatment: 0 Encounter Discharge Information Items Discharge Pain Level: 6 Discharge Condition: Stable Ambulatory Status: Wheelchair Discharge Destination: Home Transportation: Other Accompanied By: husband Schedule Follow-up Appointment: Yes Medication Reconciliation completed and provided to Patient/Care No Jasmarie Coppock: Provided on Clinical Summary of Care: 01/04/2017 Form Type Recipient Paper Patient MB Electronic Signature(s) Signed: 01/04/2017 3:17:24 PM By: Alric Quan Previous Signature: 01/04/2017 11:05:48 AM Version By: Ruthine Dose Entered By: Alric Quan on 01/04/2017 12:42:13 [name] ([id number]) -------------------------------------------------------------------------------- Lower Extremity Assessment Details Patient Name: [name] Date of Service: 01/04/2017 9:30 AM Medical Record Patient Account Number: [id number] Number: Treating RN:  Ahmed Prima 1934/05/02 (83 y.o. Other Clinician: Date of Birth/Sex: Female) Treating ROBSON, [place] Primary Care Bevelyn Arriola: Emily Filbert Shayde Gervacio/Extender: G Referring Garlene Apperson: Melina Modena in Treatment: 0 Edema Assessment Assessed: [Left: No] [Right: No] E[Left: dema] [Right: :] Calf Left: Right: Point of Measurement: 31 cm From Medial Instep 39 cm 36 cm Ankle Left: Right: Point of Measurement: 8 cm From Medial Instep 20.5 cm 29 cm Vascular Assessment Pulses: Dorsalis Pedis Palpable: [Left:No] [Right:No] Posterior Tibial Extremity colors, hair growth, and conditions: Extremity Color: [Left:Mottled] [Right:Mottled] Temperature of Extremity: [Left:Warm] [Right:Warm] Capillary Refill: [Left:> 3 seconds] [Right:> 3 seconds] Toe Nail Assessment Left: Right: Thick: No No Discolored: No No Deformed: No No Improper Length and Hygiene: Yes Yes Electronic Signature(s) Signed: 01/04/2017 3:17:24 PM By: Alric Quan Entered By: Alric Quan on 01/04/2017 10:01:59 [name] ([id number]) [name], [name] ([id number]) -------------------------------------------------------------------------------- Multi Wound Chart Details Patient Name: [name] Date of Service: 01/04/2017 9:30 AM Medical Record Patient Account Number: [id number] Number: Treating RN: Ahmed Prima 29-Apr-1934 (83 y.o. 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 the Lower Extremity Comorbid History: Sleep Apnea, Arrhythmia, Sleep Apnea, Arrhythmia, Sleep Apnea, Arrhythmia, Hypertension, Cirrhosis , Hypertension, Cirrhosis , Hypertension, Cirrhosis , Type II Diabetes, End Type II Diabetes, End Type II Diabetes, End Stage Renal Disease, Stage Renal Disease, Stage Renal Disease, Gout, Osteoarthritis Gout, Osteoarthritis Gout, Osteoarthritis Date Acquired: 11/04/2016 11/04/2016 11/04/2016 Weeks of Treatment: 0 0 0 Wound Status: Open Open Open Measurements L x W x D 3x6x0.2 6x4x0.2 4x9x0.1 (cm) Area (cm) : 14.137 18.85 28.274 Volume (cm) : 2.827 3.77 2.827 % Reduction in Area: 0.00% 0.00% 0.00% % Reduction in Volume: 0.00% 0.00% 0.00% Classification: Grade 1 Grade 1 Category/Stage II HBO Classification: N/A N/A Grade 1 Exudate Amount: Large Large Large Exudate Type: Serosanguineous Serosanguineous Serous Exudate Color: red, brown red, brown amber Cowman, Minda R. ([id number]) Wound Margin: Distinct, outline attached Distinct, outline attached Distinct, outline  attached Granulation Amount: None Present (0%) Small (1-33%) None Present (0%) Granulation Quality: N/A Pink N/A Necrotic Amount: Large (67-100%) Large (67-100%) Large (67-100%) Necrotic Tissue: Eschar Eschar Eschar Exposed Structures: Fascia: No Fascia: No N/A Fat Layer (Subcutaneous Fat Layer (Subcutaneous Tissue) Exposed: No Tissue) Exposed: No Tendon: No Tendon: No Muscle: No Muscle: No Joint: No Joint: No Bone: No Bone: No Limited to Skin Limited to Skin Breakdown Breakdown Epithelialization: None None None Debridement: Debridement [id number] Debridement [id number] N/A [id number]) 11047) Pre-procedure 10:23 10:23 N/A Verification/Time Out Taken: Pain Control: Lidocaine 4% Topical Lidocaine 4% Topical N/A Solution Solution Tissue Debrided: Fibrin/Slough, Exudates, Fibrin/Slough, Exudates, N/A Subcutaneous Subcutaneous Level:  Skin/Subcutaneous Skin/Subcutaneous N/A Tissue Tissue Debridement Area (sq 18 24 N/A cm): Instrument: Blade, Forceps Blade, Forceps N/A Bleeding: Minimum Minimum N/A Hemostasis Achieved: Pressure Pressure N/A Procedural Pain: 0 0 N/A Post Procedural Pain: 0 0 N/A Debridement Treatment Procedure was tolerated Procedure was tolerated N/A Response: well well Post Debridement 3x6x0.3 6x4x0.3 N/A Measurements L x W x D (cm) Post Debridement 4.241 5.655 N/A Volume: (cm) Periwound Skin Texture: No Abnormalities Noted No Abnormalities Noted No Abnormalities Noted Periwound Skin No Abnormalities Noted Maceration: Yes No Abnormalities Noted Moisture: Periwound Skin Color: Erythema: Yes Erythema: Yes Erythema: Yes Erythema Location: Circumferential Circumferential Circumferential Temperature: No Abnormality No Abnormality No Abnormality Tenderness on Yes Yes Yes Palpation: Wound Preparation: [name], [name] ([id number]) Ulcer Cleansing: Ulcer Cleansing: Ulcer Cleansing: Rinsed/Irrigated with Rinsed/Irrigated with Rinsed/Irrigated with Saline Saline Saline Topical Anesthetic Topical Anesthetic Topical Anesthetic Applied: Other: Applied: Other: Applied: Other: LIDOCAINE 4% LIDOCAINE 4% LIDOCAINE 4% Procedures Performed: Debridement Debridement N/A Wound Number: [id number] 6  Photos: Wound Location: Right Calcaneus Right Lower Leg - Anterior Right Lower Leg - Posterior Wounding Event: Pressure Injury Gradually Appeared Gradually Appeared Primary Etiology: Pressure Ulcer 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315 % Reduction in Area: 0.00% 0.00% 0.00% % Reduction in Volume: 0.00% 0.00% 0.00% Classification: Category/Stage II Grade 1 Grade 1 HBO Classification: Grade 1 N/A N/A Exudate Amount: Large Large Large Exudate Type: Serous Serosanguineous Serosanguineous Exudate Color: amber red, brown red, brown Wound Margin: Distinct, outline attached Distinct, outline attached Distinct, outline attached Granulation Amount: None Present (0%) Large (67-100%) None Present (0%) Granulation Quality: N/A Red N/A Necrotic Amount: Large (67-100%) None Present (0%) Large (67-100%) Necrotic Tissue: Eschar, Adherent [place] Exposed Structures: N/A N/A N/A Epithelialization: None None None Debridement: N/A N/A N/A Pain Control: N/A N/A N/A Tissue Debrided: N/A N/A N/A [name], Connie R. ([id number]) Level: N/A N/A N/A Debridement Area (sq N/A N/A N/A cm): Instrument: N/A N/A N/A Bleeding: N/A N/A N/A Hemostasis Achieved: N/A N/A N/A Procedural Pain: N/A N/A N/A Post Procedural Pain: N/A N/A N/A Debridement Treatment N/A N/A N/A Response: Post Debridement N/A N/A N/A Measurements L x W x D (cm) Post Debridement N/A N/A N/A Volume: (cm) Periwound Skin Texture: No Abnormalities Noted No Abnormalities Noted No Abnormalities Noted Periwound Skin No Abnormalities Noted No Abnormalities Noted  Maceration: Yes Moisture: Periwound Skin Color: Erythema: Yes No Abnormalities Noted Erythema: Yes Erythema Location: Circumferential N/A Circumferential Temperature: No Abnormality No Abnormality No Abnormality Tenderness on Yes Yes Yes Palpation: Wound Preparation: Ulcer Cleansing: Ulcer Cleansing: Ulcer Cleansing: Rinsed/Irrigated with  Rinsed/Irrigated with Rinsed/Irrigated with Saline Saline Saline Topical Anesthetic Topical Anesthetic Topical Anesthetic Applied: Other: Applied: Other: Applied: Other: LIDOCAINE 4% LIDOCAINE 4% LIDOCAINE 4% Procedures Performed: N/A N/A N/A Treatment Notes Electronic Signature(s) Signed: 01/04/2017 3:17:24 PM By: Alric Quan Entered By: Alric Quan on 01/04/2017 12:37:56 [name] ([id number]) -------------------------------------------------------------------------------- Multi-Disciplinary Care Plan Details Patient Name: [name] Date of Service: 01/04/2017 9:30 AM Medical Record Patient Account Number: [id number] Number: Treating RN: Ahmed Prima 01/29/1934 (83 y.o. Other Clinician: Date of Birth/Sex: Female) Treating ROBSON, [place] Primary Care Aleasha Fregeau: Emily Filbert Reema Chick/Extender: G Referring Shaquera Ansley: Melina Modena in Treatment: 0 Active Inactive ` Abuse / Safety / Falls / Self Care Management Nursing Diagnoses: Potential for falls Goals: Patient will remain injury free Date Initiated: 01/04/2017 Target Resolution Date: 02/24/2017 Goal Status: Active Interventions: Assess fall risk on admission and as needed Assess self care needs on admission and as needed Notes: ` Nutrition Nursing Diagnoses: Imbalanced nutrition Impaired glucose control: actual or potential Potential for alteratiion in Nutrition/Potential for imbalanced nutrition Goals: Patient/caregiver agrees to and verbalizes understanding of need to use nutritional supplements and/or vitamins as prescribed Date Initiated: 01/04/2017 Target Resolution Date: 02/24/2017 Goal Status: Active Interventions: Assess patient nutrition upon admission and as needed per policy Notes: [name], [name] ([id number]) Orientation to the Wound Care Program Nursing Diagnoses: Knowledge deficit related to the wound healing center program Goals: Patient/caregiver will verbalize  understanding of the [place] [facility] Date Initiated: 01/04/2017 Target Resolution Date: 02/24/2017 Goal Status: Active Interventions: Provide education on orientation to the wound center Notes: ` Pain, Acute or Chronic Nursing Diagnoses: Pain, acute or chronic: actual or potential Potential alteration in comfort, pain Goals: Patient will verbalize adequate pain control and receive pain control interventions during procedures as needed Date Initiated: 01/04/2017 Target Resolution Date: 02/24/2017 Goal Status: Active Interventions: Assess comfort goal upon admission Complete pain assessment as per visit requirements Notes: ` Wound/Skin Impairment Nursing Diagnoses: Impaired tissue integrity Goals: Ulcer/skin breakdown will have a volume reduction of 80% by week 12 Date Initiated: 01/04/2017 Target Resolution Date: 02/24/2017 Goal Status: Active Interventions: Assess patient/caregiver ability to perform ulcer/skin care regimen upon admission and as needed [name], [name] ([id number]) Notes: Electronic Signature(s) Signed: 01/04/2017 3:17:24 PM By: Alric Quan Entered By: Alric Quan on 01/04/2017 12:37:41 [name] ([id number]) -------------------------------------------------------------------------------- Pain Assessment Details Patient Name: [name] Date of Service: 01/04/2017 9:30 AM Medical Record Patient Account Number: [id number] Number: Treating RN: Ahmed Prima 21-Dec-1933 (83 y.o. Other Clinician: Date of Birth/Sex: Female) Treating ROBSON, MICHAEL Primary Care [place]: Emily Filbert Khallid Pasillas/Extender: G Referring Jaliah Foody: Melina Modena in Treatment: 0 Active Problems Location of Pain Severity and Description of Pain Patient Has Paino Yes Site Locations Pain Location: Pain in Ulcers With Dressing Change: Yes Duration of the Pain. Constant / Intermittento Constant Rate the pain. Current Pain Level: 10 Worst  Pain Level: 10 Least Pain Level: 5 Character of Pain Describe the Pain: Aching, Burning Pain Management and Medication Current Pain Management: Electronic Signature(s) Signed: 01/04/2017 3:17:24 PM By: Alric Quan Entered By: Alric Quan on 01/04/2017 09:34:37 [name] ([id number]) -------------------------------------------------------------------------------- Patient/Caregiver Education Details Patient Name: [name] Date of Service: 01/04/2017 9:30 AM Medical Record Patient Account  Number: [id number] Number: Treating RN: Ahmed Prima 12/07/1934 (83 y.o. Other Clinician: Date of Birth/Gender: Female) Treating ROBSON, [place] Primary Care Physician: Emily Filbert Physician/Extender: G Referring Physician: Melina Modena in Treatment: 0 Education Assessment Education Provided To: Patient Education Topics Provided Welcome To [facility]: Handouts: [facility] Methods: Explain/Verbal Responses: State content correctly Wound/Skin Impairment: Handouts: Other: change dressing as ordered and do not get dressings wet Methods: Demonstration, Explain/Verbal Responses: State content correctly Electronic Signature(s) Signed: 01/04/2017 3:17:24 PM By: Alric Quan Entered By: Alric Quan on 01/04/2017 12:42:32 [name] ([id number]) -------------------------------------------------------------------------------- Wound Assessment Details Patient Name: [name] Date of Service: 01/04/2017 9:30 AM Medical Record Patient Account Number: [id number] Number: Treating RN: Ahmed Prima 1934/06/10 (83 y.o. Other Clinician: Date of Birth/Sex: Female) Treating ROBSON, [place] Primary Care Yarethzy Croak: Emily Filbert Henryk Ursin/Extender: G Referring Hillery Bhalla: Melina Modena in Treatment: 0 Wound Status Wound Number: 1 Primary Diabetic Wound/Ulcer of the Lower Etiology: Extremity Wound Location:  Left Foot - Dorsal Wound Open Wounding Event: Gradually Appeared Status: Date Acquired: 11/04/2016 Comorbid Sleep Apnea, Arrhythmia, Hypertension, Weeks Of Treatment: 0 History: Cirrhosis , Type II Diabetes, End Stage Clustered Wound: No Renal Disease, Gout, Osteoarthritis Wound Measurements Length: (cm) 3 Width: (cm) 6 Depth: (cm) 0.2 Area: (cm) 14.137 Volume: (cm) 2.827 % Reduction in Area: 0% % Reduction in Volume: 0% Epithelialization: None Tunneling: No Wound Description Classification: Grade 1 Wound Margin: Distinct, outline attached Exudate Amount: Large Exudate Type: Serosanguineous Exudate Color: red, brown Foul Odor After Cleansing: No Slough/Fibrino No Wound Bed Granulation Amount: None Present (0%) Exposed Structure Necrotic Amount: Large (67-100%) Fascia Exposed: No Necrotic Quality: Eschar Fat Layer (Subcutaneous Tissue) Exposed: No Tendon Exposed: No Muscle Exposed: No Joint Exposed: No Bone Exposed: No Limited to Skin Breakdown Periwound Skin Texture Texture Color No Abnormalities Noted: No No Abnormalities Noted: No Erythema: Yes Moisture [name], Connie R. ([id number]) No Abnormalities Noted: No Erythema Location: Circumferential Temperature / Pain Temperature: No Abnormality Tenderness on Palpation: Yes Wound Preparation Ulcer Cleansing: Rinsed/Irrigated with Saline Topical Anesthetic Applied: Other: LIDOCAINE 4%, Electronic Signature(s) Signed: 04/12/2017 4:43:15 PM By: Alric Quan Previous Signature: 01/04/2017 3:17:24 PM Version By: Alric Quan Entered By: Alric Quan on 02/22/2017 17:13:07 [name] ([id number]) -------------------------------------------------------------------------------- Wound Assessment Details Patient Name: [name] Date of Service: 01/04/2017 9:30 AM Medical Record Patient Account Number: [id number] Number: Treating RN: Ahmed Prima 05-15-1934 (83 y.o. Other  Clinician: Date of Birth/Sex: Female) Treating ROBSON, [place] Primary Care Marvalene Barrett: Emily Filbert Cuthbert Turton/Extender: G Referring Callyn Severtson: Melina Modena in Treatment: 0 Wound Status Wound Number: 2 Primary Diabetic Wound/Ulcer of the Lower Etiology: Extremity Wound Location: Foot - Dorsal Wound Open Wounding Event: Gradually Appeared Status: Date Acquired: 11/04/2016 Comorbid Sleep Apnea, Arrhythmia, Hypertension, Weeks Of Treatment: 0 History: Cirrhosis , Type II Diabetes, End Stage Clustered Wound: No Renal Disease, Gout, Osteoarthritis Photos Photo Uploaded By: Alric Quan on 01/04/2017 11:32:10 Wound Measurements Length: (cm) 6 Width: (cm) 4 Depth: (cm) 0.2 Area: (cm) 18.85 Volume: (cm) 3.77 % Reduction in Area: 0% % Reduction in Volume: 0% Epithelialization: None Tunneling: No Undermining: No Wound Description Classification: Grade 1 Wound Margin: Distinct, outline attached Exudate Amount: Large Exudate Type: Serosanguineous Exudate Color: red, brown Wound Bed Granulation Amount: Small (1-33%) Exposed Structure Granulation Quality: Pink Fascia Exposed: No Necrotic Amount: Large (67-100%) Fat Layer (Subcutaneous Tissue) Exposed: No Necrotic Quality: Eschar Tendon Exposed: No [name], Connie R. ([id number]) Muscle Exposed: No Joint Exposed: No Bone  Exposed: No Limited to Skin Breakdown Periwound Skin Texture Texture Color No Abnormalities Noted: No No Abnormalities Noted: No Erythema: Yes Moisture Erythema Location: Circumferential No Abnormalities Noted: No Maceration: Yes Temperature / Pain Temperature: No Abnormality Tenderness on Palpation: Yes Wound Preparation Ulcer Cleansing: Rinsed/Irrigated with Saline Topical Anesthetic Applied: Other: LIDOCAINE 4%, Electronic Signature(s) Signed: 01/04/2017 3:17:24 PM By: Alric Quan Entered By: Alric Quan on 01/04/2017 10:12:53 [name]  ([id number]) -------------------------------------------------------------------------------- Wound Assessment Details Patient Name: [name] Date of Service: 01/04/2017 9:30 AM Medical Record Patient Account Number: [id number] Number: Treating RN: Ahmed Prima Sep 11, 1934 (83 y.o. Other Clinician: Date of Birth/Sex: Female) Treating ROBSON, [place] Primary Care Karlis Cregg: Emily Filbert Amandalee Lacap/Extender: G Referring Laxmi Choung: Melina Modena in Treatment: 0 Wound Status Wound Number: 3 Primary Pressure Ulcer Etiology: Wound Location: Left Calcaneus Wound Open Wounding Event: Pressure Injury Status: Date Acquired: 11/04/2016 Comorbid Sleep Apnea, Arrhythmia, Hypertension, Weeks Of Treatment: 0 History: Cirrhosis , Type II Diabetes, End Stage Clustered Wound: No Renal Disease, Gout, Osteoarthritis Photos Photo Uploaded By: Alric Quan on 01/04/2017 11:32:10 Wound Measurements Length: (cm) 4 Width: (cm) 9 Depth: (cm) 0.1 Area: (cm) 28.274 Volume: (cm) 2.827 % Reduction in Area: 0% % Reduction in Volume: 0% Epithelialization: None Tunneling: No Undermining: No Wound Description Classification: Category/Stage II Foul O Diabetic Severity (Wagner): Grade 1 Slough Wound Margin: Distinct, outline attached Exudate Amount: Large Exudate Type: Serous Exudate Color: amber dor After Cleansing: No /Fibrino No Wound Bed Granulation Amount: None Present (0%) Necrotic Amount: Large (67-100%) Necrotic Quality: [name], Connie R. ([id number]) Periwound Skin Texture Texture Color No Abnormalities Noted: No No Abnormalities Noted: No Erythema: Yes Moisture Erythema Location: Circumferential No Abnormalities Noted: No Temperature / Pain Temperature: No Abnormality Tenderness on Palpation: Yes Wound Preparation Ulcer Cleansing: Rinsed/Irrigated with Saline Topical Anesthetic Applied: Other: LIDOCAINE 4%, Electronic Signature(s) Signed:  01/04/2017 3:17:24 PM By: Alric Quan Entered By: Alric Quan on 01/04/2017 10:13:46 [name] ([id number]) -------------------------------------------------------------------------------- Wound Assessment Details Patient Name: [name] Date of Service: 01/04/2017 9:30 AM Medical Record Patient Account Number: [id number] Number: Treating RN: Ahmed Prima 04/27/1934 (83 y.o. Other Clinician: Date of Birth/Sex: Female) Treating ROBSON, [place] [facility] Kwanza Cancelliere: Emily Filbert Cassity Christian/Extender: G Referring Samnang Shugars: Melina Modena in Treatment: 0 Wound Status Wound Number: 4 Primary Pressure Ulcer Etiology: Wound Location: Right Calcaneus Wound Open Wounding Event: Pressure Injury Status: Date Acquired: 11/04/2016 Comorbid Sleep Apnea, Arrhythmia, Hypertension, Weeks Of Treatment: 0 History: Cirrhosis , Type II Diabetes, End Stage Clustered Wound: No Renal Disease, Gout, Osteoarthritis Photos Photo Uploaded By: Alric Quan on 01/04/2017 11:33:48 Wound Measurements Length: (cm) 5 Width: (cm) 5 Depth: (cm) 0.2 Area: (cm) 19.635 Volume: (cm) 3.927 % Reduction in Area: 0% % Reduction in Volume: 0% Epithelialization: None Tunneling: No Undermining: No Wound Description Classification: Category/Stage II Foul O Diabetic Severity (Wagner): Grade 1 Slough Wound Margin: Distinct, outline attached Exudate Amount: Large Exudate Type: Serous Exudate Color: amber dor After Cleansing: No /Fibrino No Wound Bed Granulation Amount: None Present (0%) Necrotic Amount: Large (67-100%) Necrotic Quality: Eschar, Adherent Slough Schoenchen, Colin R. ([id number]) Periwound Skin Texture Texture Color No Abnormalities Noted: No No Abnormalities Noted: No Erythema: Yes Moisture Erythema Location: Circumferential No Abnormalities Noted: No Temperature / Pain Temperature: No Abnormality Tenderness on Palpation: Yes Wound Preparation Ulcer  Cleansing: Rinsed/Irrigated with Saline Topical Anesthetic Applied: Other: LIDOCAINE 4%, Electronic Signature(s) Signed: 01/04/2017 3:17:24 PM By: Alric Quan Entered By: Alric Quan on 01/04/2017 10:14:15 [name] ([id number]) --------------------------------------------------------------------------------  Wound Assessment Details Patient Name: SHELBYE, IGO Date of Service: 01/04/2017 9:30 AM Medical Record Patient Account Number: [id number] Number: Treating RN: Ahmed Prima 1934-07-16 (83 y.o. Other Clinician: Date of Birth/Sex: Female) Treating ROBSON, [place] [facility] Addaleigh Nicholls: Emily Filbert Leia Coletti/Extender: G Referring Nadeem Romanoski: Melina Modena in Treatment: 0 Wound Status Wound Number: 5 Primary Diabetic Wound/Ulcer of the Lower Etiology: Extremity Wound Location: Right Lower Leg - Anterior Wound Open Wounding Event: Gradually Appeared Status: Date Acquired: 11/04/2016 Comorbid Sleep Apnea, Arrhythmia, Hypertension, Weeks Of Treatment: 0 History: Cirrhosis , Type II Diabetes, End Stage Clustered Wound: No Renal Disease, Gout, Osteoarthritis Photos Photo Uploaded By: Alric Quan on 01/04/2017 11:33:48 Wound Measurements Length: (cm) 2 Width: (cm) 0.5 Depth: (cm) 0.1 Area: (cm) 0.785 Volume: (cm) 0.079 % Reduction in Area: 0% % Reduction in Volume: 0% Epithelialization: None Tunneling: No Undermining: No Wound Description Classification: Grade 1 Foul Odor Aft Wound Margin: Distinct, outline attached Slough/Fibrin Exudate Amount: Large Exudate Type: Serosanguineous Exudate Color: red, brown er Cleansing: No o No Wound Bed Granulation Amount: Large (67-100%) Granulation Quality: Red Necrotic Amount: None Present (0%) Periwound Skin Texture Mccallum, Makiah R. ([id number]) Texture Color No Abnormalities Noted: No No Abnormalities Noted: No Moisture Temperature / Pain No Abnormalities Noted: No Temperature: No  Abnormality Tenderness on Palpation: Yes Wound Preparation Ulcer Cleansing: Rinsed/Irrigated with Saline Topical Anesthetic Applied: Other: LIDOCAINE 4%, Electronic Signature(s) Signed: 01/04/2017 3:17:24 PM By: Alric Quan Entered By: Alric Quan on 01/04/2017 10:15:08 [name] ([id number]) -------------------------------------------------------------------------------- Wound Assessment Details Patient Name: [name] Date of Service: 01/04/2017 9:30 AM Medical Record Patient Account Number: [id number] Number: Treating RN: Ahmed Prima 11/20/1934 (83 y.o. Other Clinician: Date of Birth/Sex: Female) Treating ROBSON, [place] [facility] Tessla Spurling: Emily Filbert Merrilee Ancona/Extender: G Referring Jibri Schriefer: Melina Modena in Treatment: 0 Wound Status Wound Number: 6 Primary Diabetic Wound/Ulcer of the Lower Etiology: Extremity Wound Location: Right Lower Leg - Posterior Wound Open Wounding Event: Gradually Appeared Status: Date Acquired: 11/04/2016 Comorbid Sleep Apnea, Arrhythmia, Hypertension, Weeks Of Treatment: 0 History: Cirrhosis , Type II Diabetes, End Stage Clustered Wound: No Renal Disease, Gout, Osteoarthritis Photos Photo Uploaded By: Alric Quan on 01/04/2017 12:00:10 Wound Measurements Length: (cm) 6.5 Width: (cm) 2 Depth: (cm) 1.5 Area: (cm) 10.21 Volume: (cm) 15.315 % Reduction in Area: 0% % Reduction in Volume: 0% Epithelialization: None Tunneling: No Undermining: No Wound Description Classification: Grade 1 Foul Odor Afte Wound Margin: Distinct, outline attached Slough/Fibrino Exudate Amount: Large Exudate Type: Serosanguineous Exudate Color: red, brown r Cleansing: No No Wound Bed Granulation Amount: None Present (0%) Necrotic Amount: Large (67-100%) Necrotic Quality: Adherent [address] JAQUETA, KASAI R. ([id number]) Texture Color No Abnormalities Noted: No No Abnormalities Noted:  No Erythema: Yes Moisture Erythema Location: Circumferential No Abnormalities Noted: No Maceration: Yes Temperature / Pain Temperature: No Abnormality Tenderness on Palpation: Yes Wound Preparation Ulcer Cleansing: Rinsed/Irrigated with Saline Topical Anesthetic Applied: Other: LIDOCAINE 4%, Electronic Signature(s) Signed: 01/04/2017 3:17:24 PM By: Alric Quan Entered By: Alric Quan on 01/04/2017 10:16:18 [name] ([id number]) -------------------------------------------------------------------------------- Ingenio Details Patient Name: [name] Date of Service: 01/04/2017 9:30 AM Medical Record Patient Account Number: [id number] Number: Treating RN: Ahmed Prima 06/20/1934 (83 y.o. Other Clinician: Date of Birth/Sex: Female) Treating ROBSON, MICHAEL Primary Care Xavier Fournier: Emily Filbert Milia Warth/Extender: G Referring Nisha Dhami: Melina Modena in Treatment: 0 Vital Signs Time Taken: 09:34 Temperature (F): 97.7 Height (in): 61 Pulse (bpm): 75 Source: Stated Respiratory Rate (breaths/min): 16 Weight (lbs): 206  Blood Pressure (mmHg): 135/100 Source: Stated Reference Range: 80 - 120 mg / dl Body Mass Index (BMI): 38.9 Electronic Signature(s) Signed: 01/04/2017 3:17:24 PM By: Alric Quan Entered By: Alric Quan on 01/04/2017 09:35:51

## 2017-01-06 ENCOUNTER — Ambulatory Visit
Admission: RE | Admit: 2017-01-06 | Discharge: 2017-01-06 | Disposition: A | Discharge status: Home / Self Care | Payer: Medicare Other | Source: Ambulatory Visit | Attending: Internal Medicine | Admitting: Internal Medicine

## 2017-01-06 ENCOUNTER — Other Ambulatory Visit: Payer: Self-pay | Admitting: Internal Medicine

## 2017-01-06 DIAGNOSIS — B999 Unspecified infectious disease: Secondary | ICD-10-CM

## 2017-01-06 DIAGNOSIS — M85871 Other specified disorders of bone density and structure, right ankle and foot: Secondary | ICD-10-CM | POA: Diagnosis not present

## 2017-01-06 DIAGNOSIS — I739 Peripheral vascular disease, unspecified: Secondary | ICD-10-CM | POA: Insufficient documentation

## 2017-01-06 DIAGNOSIS — M85872 Other specified disorders of bone density and structure, left ankle and foot: Secondary | ICD-10-CM | POA: Insufficient documentation

## 2017-01-06 DIAGNOSIS — M869 Osteomyelitis, unspecified: Secondary | ICD-10-CM | POA: Diagnosis not present

## 2017-01-11 ENCOUNTER — Other Ambulatory Visit: Payer: Self-pay | Admitting: Internal Medicine

## 2017-01-11 ENCOUNTER — Encounter: Payer: Medicare Other | Admitting: Internal Medicine

## 2017-01-11 DIAGNOSIS — M869 Osteomyelitis, unspecified: Secondary | ICD-10-CM

## 2017-01-11 DIAGNOSIS — L97515 Non-pressure chronic ulcer of other part of right foot with muscle involvement without evidence of necrosis: Secondary | ICD-10-CM

## 2017-01-11 DIAGNOSIS — E11621 Type 2 diabetes mellitus with foot ulcer: Secondary | ICD-10-CM | POA: Diagnosis not present

## 2017-01-11 DIAGNOSIS — L97525 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 without evidence of necrosis: Secondary | ICD-10-CM

## 2017-01-12 NOTE — Progress Notes (Signed)
[name], [name] ([id number]) Visit Report for 01/11/2017 Arrival Information Details Patient Name: [name], [name] Date of Service: 01/11/2017 12:30 PM Medical Record Patient Account Number: [id number] Number: Treating RN: Ahmed Prima 03/06/1934 (83 y.o. Other Clinician: Date of Birth/Sex: Female) Treating ROBSON, MICHAEL Primary Care Raney Koeppen: Emily Filbert Shedrick Sarli/Extender: G Referring [employer]: Melina Modena in Treatment: 1 Visit Information History Since Last Visit All ordered tests and consults were completed: No Patient Arrived: Wheel Chair Added or deleted any medications: No Arrival Time: 12:30 Any new allergies or adverse reactions: No Accompanied By: husband Had a fall or experienced change in No activities of daily living that may affect Transfer Assistance: Harrel Lemon Lift risk of falls: Patient Identification Verified: Yes Signs or symptoms of abuse/neglect since last No Secondary Verification Process Yes visito Completed: Hospitalized since last visit: No Patient Requires Transmission-Based No Has Dressing in Place as Prescribed: Yes Precautions: Has Compression in Place as Prescribed: Yes Patient Has Alerts: Yes Pain Present Now: Yes Patient Alerts: DM II Electronic Signature(s) Signed: 01/11/2017 4:30:41 PM By: Alric Quan Entered By: Alric Quan on 01/11/2017 12:30:51 [name] ([id number]) -------------------------------------------------------------------------------- Encounter Discharge Information Details Patient Name: [name] Date of Service: 01/11/2017 12:30 PM Medical Record Patient Account Number: [id number] Number: Treating RN: Ahmed Prima [birth year] (83 y.o. Other Clinician: Date of Birth/Sex: Female) Treating ROBSON, MICHAEL Primary Care Justina Bertini: Emily Filbert Faraaz Wolin/Extender: G Referring Elisabel Hanover: Melina Modena in Treatment: 1 Encounter Discharge Information Items Discharge Pain Level:  0 Discharge Condition: Stable Ambulatory Status: Wheelchair Discharge Destination: Home Transportation: Private Auto Accompanied By: husband Schedule Follow-up Appointment: Yes Medication Reconciliation completed and provided to Patient/Care No Quinn Bartling: Provided on Clinical Summary of Care: 01/11/2017 Form Type Recipient Paper Patient MB Electronic Signature(s) Signed: 01/11/2017 1:52:50 PM By: Ruthine Dose Entered By: Ruthine Dose on 01/11/2017 13:52:49 [name] ([id number]) -------------------------------------------------------------------------------- Lower Extremity Assessment Details Patient Name: [name] Date of Service: 01/11/2017 12:30 PM Medical Record Patient Account Number: [id number] Number: Treating RN: Ahmed Prima 02-07-1934 (83 y.o. Other Clinician: Date of Birth/Sex: Female) Treating ROBSON, MICHAEL Primary Care Juris Gosnell: Emily Filbert Takeshi Teasdale/Extender: G Referring Daneesha Quinteros: Melina Modena in Treatment: 1 Edema Assessment Assessed: [Left: No] [Right: No] E[Left: dema] [Right: :] Calf Left: Right: Point of Measurement: 31 cm From Medial Instep 39 cm 36 cm Ankle Left: Right: Point of Measurement: 8 cm From Medial Instep 20.5 cm 29 cm Vascular Assessment Pulses: Dorsalis Pedis Palpable: [Left:Yes] [Right:Yes] Posterior Tibial Extremity colors, hair growth, and conditions: Extremity Color: [Left:Mottled] [Right:Mottled] Temperature of Extremity: [Left:Cool] [Right:Cool] Capillary Refill: [Left:> 3 seconds] [Right:> 3 seconds] Electronic Signature(s) Signed: 01/11/2017 4:30:41 PM By: Alric Quan Entered By: Alric Quan on 01/11/2017 12:58:44 [name] ([id number]) -------------------------------------------------------------------------------- Multi Wound Chart Details Patient Name: [name] Date of Service: 01/11/2017 12:30 PM Medical Record Patient Account Number:  [id number] Number: Treating RN: Ahmed Prima [birth year] (83 y.o. 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 the Lower Extremity Comorbid History: N/A Sleep Apnea, Arrhythmia, Sleep Apnea, Arrhythmia, Hypertension, Cirrhosis , Hypertension, Cirrhosis , Type II Diabetes,  End Type II Diabetes, End Stage Renal Disease, Stage Renal Disease, Gout, Osteoarthritis Gout, Osteoarthritis Date Acquired: 11/04/2016 11/04/2016 11/04/2016 Weeks of Treatment: 1 1 1  Wound Status: Open Open Open Measurements L x W x D 4x5.2x0.2 6x4.3x0.2 4x9x0.1 (cm) Area (cm) : 16.336 20.263 28.274 Volume (cm) : 3.267 4.053 2.827 % Reduction in Area: -15.60% -7.50% 0.00% % Reduction in Volume: -15.60% -7.50% 0.00% Classification: Grade 1 Grade 1 Category/Stage II HBO Classification: N/A N/A Grade 1 Exudate Amount: N/A Large Large [name], Connie R. ([id number]) Exudate Type: N/A Purulent Serous Exudate Color: N/A yellow, brown, green amber Wound Margin: N/A Distinct, outline attached Distinct, outline attached Granulation Amount: N/A Small (1-33%) None Present (0%) Granulation Quality: N/A Pink N/A Necrotic Amount: N/A Large (67-100%) Large (67-100%) Necrotic Tissue: N/A Eschar, Adherent Slough Eschar Epithelialization: N/A None None Debridement: Debridement [id number] Debridement [id number] N/A [id number]) 11047) Pre-procedure 13:08 13:08  N/A Verification/Time Out Taken: Pain Control: Lidocaine 4% Topical Lidocaine 4% Topical N/A Solution Solution Tissue Debrided: Fibrin/Slough, Exudates, Fibrin/Slough, Exudates, N/A Subcutaneous Subcutaneous Level: Skin/Subcutaneous Skin/Subcutaneous N/A Tissue Tissue Debridement Area (sq 20.8 25.8 N/A cm): Instrument: Curette Curette N/A Bleeding: Moderate Moderate N/A Hemostasis Achieved: Silver Nitrate Silver Nitrate N/A Procedural Pain: 0 0 N/A Post Procedural Pain: 0 0 N/A Debridement Treatment Procedure was tolerated Procedure was tolerated N/A Response: well well Post Debridement 4x5.2x0.3 6x4.3x0.3 N/A Measurements L x W x D (cm) Post Debridement 4.901 6.079 N/A Volume: (cm) Periwound Skin Texture: No Abnormalities Noted No Abnormalities Noted No Abnormalities Noted Periwound Skin No Abnormalities Noted Maceration: Yes No Abnormalities Noted Moisture: Periwound Skin Color: No Abnormalities Noted Erythema: Yes Erythema: Yes Erythema Location: N/A Circumferential Circumferential Temperature: N/A No Abnormality No Abnormality Tenderness on No Yes Yes Palpation: Wound Preparation: N/A Ulcer Cleansing: Ulcer Cleansing: Rinsed/Irrigated with Rinsed/Irrigated with Saline Saline Topical Anesthetic Topical Anesthetic Applied: Other: Applied: Other: LIDOCAINE 4% LIDOCAINE 4% Procedures Performed: Debridement Debridement N/A [name], [name] ([id number]) Wound Number: [id number]  Photos: Wound Location: Right Calcaneus Right Lower Leg - Anterior Right Lower Leg - Posterior Wounding Event: Pressure Injury Gradually Appeared Gradually Appeared Primary Etiology: Pressure Ulcer 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 the Lower Extremity Comorbid History: Sleep Apnea, Arrhythmia, Sleep Apnea, Arrhythmia, Sleep Apnea, Arrhythmia, Hypertension, Cirrhosis , Hypertension, Cirrhosis , Hypertension, Cirrhosis , Type II Diabetes, End Type II Diabetes, End Type  II Diabetes, End Stage Renal Disease, Stage Renal Disease, Stage Renal Disease, Gout, Osteoarthritis Gout, Osteoarthritis Gout, Osteoarthritis Date Acquired: 11/04/2016 11/04/2016 11/04/2016 Weeks of Treatment: 1 1 1  Wound Status: Open Open Open Measurements L x W x D 5.3x2.7x0.2 1.2x0.3x0.1 5x1.8x1.3 (cm) Area (cm) : 11.239 0.283 7.069 Volume (cm) : 2.248 0.028 9.189 % Reduction in Area: 42.80% 63.90% 30.80% % Reduction in Volume: 42.80% 64.60% 40.00% Classification: Category/Stage II Grade 1 Grade 1 HBO Classification: Grade 1 N/A N/A Exudate Amount: Large Large Large Exudate Type: Purulent Serosanguineous Purulent Exudate Color: yellow, brown, green red, brown yellow, brown, green Wound Margin: Distinct, outline attached Distinct, outline attached Distinct, outline attached Granulation Amount: None Present (0%) Large (67-100%) None Present (0%) Granulation Quality: N/A Red N/A Necrotic Amount: Large (67-100%) Small (1-33%) Large (67-100%) Necrotic Tissue: Eschar, Adherent [place] Exposed Structures: N/A N/A N/A Epithelialization: None None None Debridement: N/A N/A N/A Pain Control: N/A N/A N/A Tissue Debrided: N/A N/A N/A Level: N/A N/A N/A Debridement Area (sq N/A N/A N/A cm): Instrument: N/A N/A N/A Bleeding: N/A N/A N/A Hemostasis Achieved: N/A N/A N/A Procedural Pain: N/A  N/A N/A [name], [name] ([id number]) Post Procedural Pain: N/A N/A N/A Debridement Treatment N/A N/A N/A Response: Post Debridement N/A N/A N/A Measurements L x W x D (cm) Post Debridement N/A N/A N/A Volume: (cm) Periwound Skin Texture: No Abnormalities Noted No Abnormalities Noted No Abnormalities Noted Periwound Skin No Abnormalities Noted No Abnormalities Noted Maceration: Yes Moisture: Periwound Skin Color: Erythema: Yes No Abnormalities Noted Erythema: Yes Erythema Location: Circumferential N/A Circumferential Temperature: No Abnormality No Abnormality No  Abnormality Tenderness on Yes Yes Yes Palpation: Wound Preparation: Ulcer Cleansing: Ulcer Cleansing: Ulcer Cleansing: Rinsed/Irrigated with Rinsed/Irrigated with Rinsed/Irrigated with Saline Saline Saline, Other: soap and water Topical Anesthetic Topical Anesthetic Applied: Other: Applied: Other: Topical Anesthetic LIDOCAINE 4% LIDOCAINE 4% Applied: Other: LIDOCAINE 4% Procedures Performed: N/A N/A N/A Wound Number: 7 N/A N/A Photos: N/A N/A Wound Location: Right Malleolus - Lateral N/A N/A Wounding Event: Gradually Appeared N/A N/A Primary Etiology: Diabetic Wound/Ulcer of N/A N/A the Lower Extremity Comorbid History: Sleep Apnea, Arrhythmia, N/A N/A Hypertension, Cirrhosis , Type II Diabetes, End Stage Renal Disease, Gout, Osteoarthritis Date Acquired: 01/11/2017 N/A N/A Weeks of Treatment: 0 N/A N/A Wound Status: Open N/A N/A Measurements L x W x D 0.8x0.8x0.3 N/A N/A (cm) Area (cm) : 0.503 N/A N/A Volume (cm) : 0.151 N/A N/A [name], Connie R. ([id number]) % Reduction in Area: N/A N/A N/A % Reduction in Volume: N/A N/A N/A Classification: Grade 2 N/A N/A HBO Classification: N/A N/A N/A Exudate Amount: Large N/A N/A Exudate Type: Serous N/A N/A Exudate Color: amber N/A N/A Wound Margin: Distinct, outline attached N/A N/A Granulation Amount: None Present (0%) N/A N/A Granulation Quality: N/A N/A N/A Necrotic Amount: Large (67-100%) N/A N/A Necrotic Tissue: Adherent Slough N/A N/A Exposed Structures: Fascia: No N/A N/A Fat Layer (Subcutaneous Tissue) Exposed: No Tendon: No Muscle: No Joint: No Bone: No Limited to Skin Breakdown Epithelialization: None N/A N/A Debridement: N/A N/A N/A Pain Control: N/A N/A N/A Tissue Debrided: N/A N/A N/A Level: N/A N/A N/A Debridement Area (sq N/A N/A N/A cm): Instrument: N/A N/A N/A Bleeding: N/A N/A N/A Hemostasis Achieved: N/A N/A N/A Procedural Pain: N/A N/A N/A Post Procedural Pain: N/A N/A N/A Debridement  Treatment N/A N/A N/A Response: Post Debridement N/A N/A N/A Measurements L x W x D (cm) Post Debridement N/A N/A N/A Volume: (cm) Periwound Skin Texture: No Abnormalities Noted N/A N/A Periwound Skin No Abnormalities Noted N/A N/A Moisture: Periwound Skin Color: No Abnormalities Noted N/A N/A Erythema Location: N/A N/A N/A Temperature: No Abnormality N/A N/A Tenderness on Yes N/A N/A Palpation: Wound Preparation: N/A N/A [name], Connie R. ([id number]) Ulcer Cleansing: Rinsed/Irrigated with Saline, Other: soap and water Topical Anesthetic Applied: Other: lidocaine 4% Procedures Performed: N/A N/A N/A Treatment Notes Wound #1 (Left, Dorsal Foot) 1. Cleansed with: Clean wound with Normal Saline 2. Anesthetic Topical Lidocaine 4% cream to wound bed prior to debridement 4. Dressing Applied: Iodoflex 5. Secondary Dressing Applied ABD Pad 7. Secured with 3 Layer Compression System - Bilateral Notes drawtex Wound #2 (Right, Dorsal Foot) 1. Cleansed with: Clean wound with Normal Saline 2. Anesthetic Topical Lidocaine 4% cream to wound bed prior to debridement 4. Dressing Applied: Iodoflex 5. Secondary Dressing Applied ABD Pad 7. Secured with 3 Layer Compression System - Bilateral Notes drawtex Wound #3 (Left Calcaneus) 1. Cleansed with: Clean wound with Normal Saline 2. Anesthetic Topical Lidocaine 4% cream to wound bed prior to debridement 4. Dressing Applied: Medihoney Gel [name], Connie R. ([id number]) 5. Secondary Dressing Applied ABD Pad  7. Secured with 3 Layer Compression System - Bilateral Notes drawtex Wound #4 (Right Calcaneus) 1. Cleansed with: Clean wound with Normal Saline 2. Anesthetic Topical Lidocaine 4% cream to wound bed prior to debridement 4. Dressing Applied: Medihoney Gel 5. Secondary Dressing Applied ABD Pad 7. Secured with 3 Layer Compression System - Bilateral Notes drawtex Wound #5 (Right, Anterior Lower Leg) 1. Cleansed  with: Clean wound with Normal Saline Cleanse wound with antibacterial soap and water 2. Anesthetic Topical Lidocaine 4% cream to wound bed prior to debridement 4. Dressing Applied: Prisma Ag 5. Secondary Dressing Applied ABD Pad 7. Secured with 3 Layer Compression System - Bilateral Wound #6 (Right, Posterior Lower Leg) 1. Cleansed with: Clean wound with Normal Saline Cleanse wound with antibacterial soap and water 2. Anesthetic Topical Lidocaine 4% cream to wound bed prior to debridement 4. Dressing Applied: Prisma Ag 5. Secondary Dressing Applied ABD Pad 7. Secured with 3 Layer Compression System - Bilateral [name], Connie R. ([id number]) Wound #7 (Right, Lateral Malleolus) 1. Cleansed with: Clean wound with Normal Saline Cleanse wound with antibacterial soap and water 2. Anesthetic Topical Lidocaine 4% cream to wound bed prior to debridement 4. Dressing Applied: Prisma Ag 5. Secondary Dressing Applied ABD Pad 7. Secured with 3 Layer Compression System - Bilateral Electronic Signature(s) Signed: 01/11/2017 5:54:02 PM By: Linton Ham MD Previous Signature: 01/11/2017 4:30:41 PM Version By: Alric Quan Entered By: Linton Ham on 01/11/2017 17:16:11 [name] ([id number]) -------------------------------------------------------------------------------- Multi-Disciplinary Care Plan Details Patient Name: [name] Date of Service: 01/11/2017 12:30 PM Medical Record Patient Account Number: [id number] Number: Treating RN: Ahmed Prima 1934-04-20 (83 y.o. Other Clinician: Date of Birth/Sex: Female) Treating ROBSON, Bond Primary Care Jaselle Pryer: Emily Filbert Cottrell Gentles/Extender: G Referring Antawan Mchugh: Melina Modena in Treatment: 1 Active Inactive ` Abuse / Safety / Falls / Self Care Management Nursing Diagnoses: Potential for falls Goals: Patient will remain injury free Date Initiated: 01/04/2017 Target Resolution Date:  02/24/2017 Goal Status: Active Interventions: Assess fall risk on admission and as needed Assess self care needs on admission and as needed Notes: ` Nutrition Nursing Diagnoses: Imbalanced nutrition Impaired glucose control: actual or potential Potential for alteratiion in Nutrition/Potential for imbalanced nutrition Goals: Patient/caregiver agrees to and verbalizes understanding of need to use nutritional supplements and/or vitamins as prescribed Date Initiated: 01/04/2017 Target Resolution Date: 02/24/2017 Goal Status: Active Interventions: Assess patient nutrition upon admission and as needed per policy Notes: JASIEL, FLORENCIO ([id number]) Orientation to the Wound Care Program Nursing Diagnoses: Knowledge deficit related to the wound healing center program Goals: Patient/caregiver will verbalize understanding of the [place] Program Date Initiated: 01/04/2017 Target Resolution Date: 02/24/2017 Goal Status: Active Interventions: Provide education on orientation to the wound center Notes: ` Pain, Acute or Chronic Nursing Diagnoses: Pain, acute or chronic: actual or potential Potential alteration in comfort, pain Goals: Patient will verbalize adequate pain control and receive pain control interventions during procedures as needed Date Initiated: 01/04/2017 Target Resolution Date: 02/24/2017 Goal Status: Active Interventions: Assess comfort goal upon admission Complete pain assessment as per visit requirements Notes: ` Wound/Skin Impairment Nursing Diagnoses: Impaired tissue integrity Goals: Ulcer/skin breakdown will have a volume reduction of 80% by week 12 Date Initiated: 01/04/2017 Target Resolution Date: 02/24/2017 Goal Status: Active Interventions: Assess patient/caregiver ability to perform ulcer/skin care regimen upon admission and as needed BENISHA, CASTETTER ([id number]) Notes: Electronic Signature(s) Signed: 01/11/2017 4:30:41 PM By: Alric Quan Entered By: Alric Quan on 01/11/2017 12:58:48 [name] ([id number]) --------------------------------------------------------------------------------  Pain Assessment Details Patient Name: MAUDEAN, STIRRAT Date of Service: 01/11/2017 12:30 PM Medical Record Patient Account Number: [id number] Number: Treating RN: Ahmed Prima 10-Jan-1934 (83 y.o. Other Clinician: Date of Birth/Sex: Female) Treating ROBSON, MICHAEL Primary Care Emmalina Espericueta: Emily Filbert Annistyn Depass/Extender: G Referring Brea Coleson: Melina Modena in Treatment: 1 Active Problems Location of Pain Severity and Description of Pain Patient Has Paino Yes Site Locations Pain Location: Pain in Ulcers With Dressing Change: Yes Rate the pain. Current Pain Level: 6 Character of Pain Describe the Pain: Aching, Tender Pain Management and Medication Current Pain Management: Electronic Signature(s) Signed: 01/11/2017 4:30:41 PM By: Alric Quan Entered By: Alric Quan on 01/11/2017 12:31:05 [name] ([id number]) -------------------------------------------------------------------------------- Patient/Caregiver Education Details Patient Name: [name] Date of Service: 01/11/2017 12:30 PM Medical Record Patient Account Number: [id number] Number: Treating RN: Ahmed Prima 03/20/1934 (83 y.o. Other Clinician: Date of Birth/Gender: Female) Treating ROBSON, Cisne Primary Care Physician: Emily Filbert Physician/Extender: G Referring Physician: Melina Modena in Treatment: 1 Education Assessment Education Provided To: Patient Education Topics Provided Wound/Skin Impairment: Handouts: Other: change dressing as ordered and do not get dressing wet Methods: Demonstration, Explain/Verbal Responses: State content correctly Electronic Signature(s) Signed: 01/11/2017 4:30:41 PM By: Alric Quan Entered By: Alric Quan on 01/11/2017 12:59:26 [name]  ([id number]) -------------------------------------------------------------------------------- Wound Assessment Details Patient Name: [name] Date of Service: 01/11/2017 12:30 PM Medical Record Patient Account Number: [id number] Number: Treating RN: Ahmed Prima Apr 22, 1934 (83 y.o. Other Clinician: Date of Birth/Sex: Female) Treating ROBSON, MICHAEL Primary Care Eziah Negro: Emily Filbert Ebony Rickel/Extender: G Referring Jynesis Nakamura: Melina Modena in Treatment: 1 Wound Status Wound Number: 1 Primary Diabetic Wound/Ulcer of the Lower Etiology: Extremity Wound Location: Left, Dorsal Foot Wound Status: Open Wounding Event: Gradually Appeared Date Acquired: 11/04/2016 Weeks Of Treatment: 1 Clustered Wound: No Photos Photo Uploaded By: Alric Quan on 01/11/2017 15:18:05 Wound Measurements Length: (cm) 4 Width: (cm) 5.2 Depth: (cm) 0.2 Area: (cm) 16.336 Volume: (cm) 3.267 % Reduction in Area: -15.6% % Reduction in Volume: -15.6% Wound Description Classification: Grade 1 Periwound Skin Texture Texture Color No Abnormalities Noted: No No Abnormalities Noted: No Moisture No Abnormalities Noted: No Treatment Notes Nimmons, Camarie R. ([id number]) Wound #1 (Left, Dorsal Foot) 1. Cleansed with: Clean wound with Normal Saline 2. Anesthetic Topical Lidocaine 4% cream to wound bed prior to debridement 4. Dressing Applied: Iodoflex 5. Secondary Dressing Applied ABD Pad 7. Secured with 3 Layer Compression System - Bilateral Notes drawtex Electronic Signature(s) Signed: 01/11/2017 4:30:41 PM By: Alric Quan Entered By: Alric Quan on 01/11/2017 12:51:04 [name] ([id number]) -------------------------------------------------------------------------------- Wound Assessment Details Patient Name: [name] Date of Service: 01/11/2017 12:30 PM Medical Record Patient Account Number: [id number] Number: Treating RN: Ahmed Prima Aug 18, 1934 (83 y.o. Other Clinician: Date of Birth/Sex: Female) Treating ROBSON, [place] Primary Care Anessa Charley: Emily Filbert Dewitt Judice/Extender: G Referring Shameria Trimarco: Melina Modena in Treatment: 1 Wound Status Wound Number: 2 Primary Diabetic Wound/Ulcer of the Lower Etiology: Extremity Wound Location: Right Foot - Dorsal Wound Open Wounding Event: Gradually Appeared Status: Date Acquired: 11/04/2016 Comorbid Sleep Apnea, Arrhythmia, Hypertension, Weeks Of Treatment: 1 History: Cirrhosis , Type II Diabetes, End Stage Clustered Wound: No Renal Disease, Gout, Osteoarthritis Photos Photo Uploaded By: Alric Quan on 01/11/2017 15:18:19 Wound Measurements Length: (cm) 6 Width: (cm) 4.3 Depth: (cm) 0.2 Area: (cm) 20.263 Volume: (cm) 4.053 % Reduction in Area: -7.5% % Reduction in Volume: -7.5% Epithelialization: None Tunneling: No Undermining: No Wound Description Classification: Grade 1  Wound Margin: Distinct, outline attached Exudate Amount: Large Exudate Type: Purulent Exudate Color: yellow, brown, green Wound Bed Granulation Amount: Small (1-33%) Exposed Structure Granulation Quality: Pink Fascia Exposed: No [name], Connie R. ([id number]) Necrotic Amount: Large (67-100%) Fat Layer (Subcutaneous Tissue) Exposed: No Necrotic Quality: Eschar, Adherent Slough Tendon Exposed: No Muscle Exposed: No Joint Exposed: No Bone Exposed: No Limited to Skin Breakdown Periwound Skin Texture Texture Color No Abnormalities Noted: No No Abnormalities Noted: No Erythema: Yes Moisture Erythema Location: Circumferential No Abnormalities Noted: No Maceration: Yes Temperature / Pain Temperature: No Abnormality Tenderness on Palpation: Yes Wound Preparation Ulcer Cleansing: Rinsed/Irrigated with Saline Topical Anesthetic Applied: Other: LIDOCAINE 4%, Treatment Notes Wound #2 (Right, Dorsal Foot) 1. Cleansed with: Clean wound with Normal Saline 2.  Anesthetic Topical Lidocaine 4% cream to wound bed prior to debridement 4. Dressing Applied: Iodoflex 5. Secondary Dressing Applied ABD Pad 7. Secured with 3 Layer Compression System - Bilateral Notes drawtex Electronic Signature(s) Signed: 01/11/2017 4:30:41 PM By: Alric Quan Entered By: Alric Quan on 01/11/2017 12:50:45 [name] ([id number]) -------------------------------------------------------------------------------- Wound Assessment Details Patient Name: [name] Date of Service: 01/11/2017 12:30 PM Medical Record Patient Account Number: [id number] Number: Treating RN: Ahmed Prima 1934-08-13 (83 y.o. Other Clinician: Date of Birth/Sex: Female) Treating ROBSON, [place] Primary Care Atiba Kimberlin: Emily Filbert Hisham Provence/Extender: G Referring Alyannah Sanks: Melina Modena in Treatment: 1 Wound Status Wound Number: 3 Primary Pressure Ulcer Etiology: Wound Location: Left Calcaneus Wound Open Wounding Event: Pressure Injury Status: Date Acquired: 11/04/2016 Comorbid Sleep Apnea, Arrhythmia, Hypertension, Weeks Of Treatment: 1 History: Cirrhosis , Type II Diabetes, End Stage Clustered Wound: No Renal Disease, Gout, Osteoarthritis Photos Photo Uploaded By: Alric Quan on 01/11/2017 15:18:30 Wound Measurements Length: (cm) 4 Width: (cm) 9 Depth: (cm) 0.1 Area: (cm) 28.274 Volume: (cm) 2.827 % Reduction in Area: 0% % Reduction in Volume: 0% Epithelialization: None Tunneling: No Undermining: No Wound Description Classification: Category/Stage II Foul O Diabetic Severity (Wagner): Grade 1 Slough Wound Margin: Distinct, outline attached Exudate Amount: Large Exudate Type: Serous Exudate Color: amber dor After Cleansing: No /Fibrino No Wound Bed Granulation Amount: None Present (0%) Gawlik, Arretta R. ([id number]) Necrotic Amount: Large (67-100%) Necrotic Quality: Eschar Periwound Skin Texture Texture Color No Abnormalities  Noted: No No Abnormalities Noted: No Erythema: Yes Moisture Erythema Location: Circumferential No Abnormalities Noted: No Temperature / Pain Temperature: No Abnormality Tenderness on Palpation: Yes Wound Preparation Ulcer Cleansing: Rinsed/Irrigated with Saline Topical Anesthetic Applied: Other: LIDOCAINE 4%, Treatment Notes Wound #3 (Left Calcaneus) 1. Cleansed with: Clean wound with Normal Saline 2. Anesthetic Topical Lidocaine 4% cream to wound bed prior to debridement 4. Dressing Applied: Medihoney Gel 5. Secondary Dressing Applied ABD Pad 7. Secured with 3 Layer Compression System - Bilateral Notes drawtex Electronic Signature(s) Signed: 01/11/2017 4:30:41 PM By: Alric Quan Entered By: Alric Quan on 01/11/2017 12:50:09 [name] ([id number]) -------------------------------------------------------------------------------- Wound Assessment Details Patient Name: [name] Date of Service: 01/11/2017 12:30 PM Medical Record Patient Account Number: [id number] Number: Treating RN: Ahmed Prima 10-03-1934 (83 y.o. Other Clinician: Date of Birth/Sex: Female) Treating ROBSON, MICHAEL Primary Care Egidio Lofgren: Emily Filbert Kamylle Axelson/Extender: G Referring Chandlor Noecker: Melina Modena in Treatment: 1 Wound Status Wound Number: 4 Primary Pressure Ulcer Etiology: Wound Location: Right Calcaneus Wound Open Wounding Event: Pressure Injury Status: Date Acquired: 11/04/2016 Comorbid Sleep Apnea, Arrhythmia, Hypertension, Weeks Of Treatment: 1 History: Cirrhosis , Type II Diabetes, End Stage Clustered Wound: No Renal Disease, Gout, Osteoarthritis Photos Photo Uploaded By: Alric Quan on 01/11/2017  15:18:47 Wound Measurements Length: (cm) 5.3 Width: (cm) 2.7 Depth: (cm) 0.2 Area: (cm) 11.239 Volume: (cm) 2.248 % Reduction in Area: 42.8% % Reduction in Volume: 42.8% Epithelialization: None Tunneling: No Undermining: No Wound  Description Classification: Category/Stage II Foul O Diabetic Severity (Wagner): Grade 1 Slough Wound Margin: Distinct, outline attached Exudate Amount: Large Exudate Type: Purulent Exudate Color: yellow, brown, green dor After Cleansing: No /Fibrino No Wound Bed Granulation Amount: None Present (0%) [name], Meggan R. ([id number]) Necrotic Amount: Large (67-100%) Necrotic Quality: Eschar, Adherent Slough Periwound Skin Texture Texture Color No Abnormalities Noted: No No Abnormalities Noted: No Erythema: Yes Moisture Erythema Location: Circumferential No Abnormalities Noted: No Temperature / Pain Temperature: No Abnormality Tenderness on Palpation: Yes Wound Preparation Ulcer Cleansing: Rinsed/Irrigated with Saline Topical Anesthetic Applied: Other: LIDOCAINE 4%, Treatment Notes Wound #4 (Right Calcaneus) 1. Cleansed with: Clean wound with Normal Saline 2. Anesthetic Topical Lidocaine 4% cream to wound bed prior to debridement 4. Dressing Applied: Medihoney Gel 5. Secondary Dressing Applied ABD Pad 7. Secured with 3 Layer Compression System - Bilateral Notes drawtex Electronic Signature(s) Signed: 01/11/2017 4:30:41 PM By: Alric Quan Entered By: Alric Quan on 01/11/2017 12:49:52 [name] ([id number]) -------------------------------------------------------------------------------- Wound Assessment Details Patient Name: [name] Date of Service: 01/11/2017 12:30 PM Medical Record Patient Account Number: [id number] Number: Treating RN: Ahmed Prima May 08, 1934 (83 y.o. Other Clinician: Date of Birth/Sex: Female) Treating ROBSON, [place] Primary Care Kaysan Peixoto: Emily Filbert Maximilian Tallo/Extender: G Referring Yecheskel Kurek: Melina Modena in Treatment: 1 Wound Status Wound Number: 5 Primary Diabetic Wound/Ulcer of the Lower Etiology: Extremity Wound Location: Right Lower Leg - Anterior Wound Open Wounding Event: Gradually  Appeared Status: Date Acquired: 11/04/2016 Comorbid Sleep Apnea, Arrhythmia, Hypertension, Weeks Of Treatment: 1 History: Cirrhosis , Type II Diabetes, End Stage Clustered Wound: No Renal Disease, Gout, Osteoarthritis Photos Photo Uploaded By: Alric Quan on 01/11/2017 15:19:01 Wound Measurements Length: (cm) 1.2 Width: (cm) 0.3 Depth: (cm) 0.1 Area: (cm) 0.283 Volume: (cm) 0.028 % Reduction in Area: 63.9% % Reduction in Volume: 64.6% Epithelialization: None Tunneling: No Undermining: No Wound Description Classification: Grade 1 Foul Odor Aft Wound Margin: Distinct, outline attached Slough/Fibrin Exudate Amount: Large Exudate Type: Serosanguineous Exudate Color: red, brown er Cleansing: No o No Wound Bed Granulation Amount: Large (67-100%) Granulation Quality: Red Bisson, Renelda R. ([id number]) Necrotic Amount: Small (1-33%) Necrotic Quality: Eschar Periwound Skin Texture Texture Color No Abnormalities Noted: No No Abnormalities Noted: No Moisture Temperature / Pain No Abnormalities Noted: No Temperature: No Abnormality Tenderness on Palpation: Yes Wound Preparation Ulcer Cleansing: Rinsed/Irrigated with Saline Topical Anesthetic Applied: Other: LIDOCAINE 4%, Treatment Notes Wound #5 (Right, Anterior Lower Leg) 1. Cleansed with: Clean wound with Normal Saline Cleanse wound with antibacterial soap and water 2. Anesthetic Topical Lidocaine 4% cream to wound bed prior to debridement 4. Dressing Applied: Prisma Ag 5. Secondary Dressing Applied ABD Pad 7. Secured with 3 Layer Compression System - Bilateral Electronic Signature(s) Signed: 01/11/2017 4:30:41 PM By: Alric Quan Entered By: Alric Quan on 01/11/2017 12:49:13 [name] ([id number]) -------------------------------------------------------------------------------- Wound Assessment Details Patient Name: [name] Date of Service: 01/11/2017 12:30 PM Medical Record Patient  Account Number: [id number] Number: Treating RN: Ahmed Prima [birth year] (83 y.o. Other Clinician: Date of Birth/Sex: Female) Treating ROBSON, MICHAEL Primary Care Malori Myers: Emily Filbert Jasier Calabretta/Extender: G Referring Yailen Zemaitis: Melina Modena in Treatment: 1 Wound Status Wound Number: 6 Primary Diabetic Wound/Ulcer of the Lower Etiology: Extremity Wound Location: Right Lower Leg - Posterior Wound Open Wounding Event: Gradually  Appeared Status: Date Acquired: 11/04/2016 Comorbid Sleep Apnea, Arrhythmia, Hypertension, Weeks Of Treatment: 1 History: Cirrhosis , Type II Diabetes, End Stage Clustered Wound: No Renal Disease, Gout, Osteoarthritis Photos Photo Uploaded By: Alric Quan on 01/11/2017 15:19:14 Wound Measurements Length: (cm) 5 Width: (cm) 1.8 Depth: (cm) 1.3 Area: (cm) 7.069 Volume: (cm) 9.189 % Reduction in Area: 30.8% % Reduction in Volume: 40% Epithelialization: None Tunneling: No Undermining: No Wound Description Classification: Grade 1 Foul Odor Afte Wound Margin: Distinct, outline attached Slough/Fibrino Exudate Amount: Large Exudate Type: Purulent Exudate Color: yellow, brown, green r Cleansing: No No Wound Bed Granulation Amount: None Present (0%) Necrotic Amount: Large (67-100%) Eifler, Azaryah R. ([id number]) Necrotic Quality: Adherent Slough Periwound Skin Texture Texture Color No Abnormalities Noted: No No Abnormalities Noted: No Erythema: Yes Moisture Erythema Location: Circumferential No Abnormalities Noted: No Maceration: Yes Temperature / Pain Temperature: No Abnormality Tenderness on Palpation: Yes Wound Preparation Ulcer Cleansing: Rinsed/Irrigated with Saline, Other: soap and water, Topical Anesthetic Applied: Other: LIDOCAINE 4%, Treatment Notes Wound #6 (Right, Posterior Lower Leg) 1. Cleansed with: Clean wound with Normal Saline Cleanse wound with antibacterial soap and water 2. Anesthetic Topical  Lidocaine 4% cream to wound bed prior to debridement 4. Dressing Applied: Prisma Ag 5. Secondary Dressing Applied ABD Pad 7. Secured with 3 Layer Compression System - Bilateral Electronic Signature(s) Signed: 01/11/2017 4:30:41 PM By: Alric Quan Entered By: Alric Quan on 01/11/2017 12:48:32 [name] ([id number]) -------------------------------------------------------------------------------- Wound Assessment Details Patient Name: [name] Date of Service: 01/11/2017 12:30 PM Medical Record Patient Account Number: [id number] Number: Treating RN: Ahmed Prima Feb 20, 1934 (83 y.o. Other Clinician: Date of Birth/Sex: Female) Treating ROBSON, [place] Primary Care Daianna Vasques: Emily Filbert Ziana Heyliger/Extender: G Referring Nakiah Osgood: Melina Modena in Treatment: 1 Wound Status Wound Number: 7 Primary Diabetic Wound/Ulcer of the Lower Etiology: Extremity Wound Location: Right Malleolus - Lateral Wound Open Wounding Event: Gradually Appeared Status: Date Acquired: 01/11/2017 Comorbid Sleep Apnea, Arrhythmia, Hypertension, Weeks Of Treatment: 0 History: Cirrhosis , Type II Diabetes, End Stage Clustered Wound: No Renal Disease, Gout, Osteoarthritis Photos Photo Uploaded By: Alric Quan on 01/11/2017 15:19:29 Wound Measurements Length: (cm) 0.8 Width: (cm) 0.8 Depth: (cm) 0.3 Area: (cm) 0.503 Volume: (cm) 0.151 % Reduction in Area: % Reduction in Volume: Epithelialization: None Tunneling: No Undermining: No Wound Description Classification: Grade 2 Foul Odor Afte Wound Margin: Distinct, outline attached Slough/Fibrino Exudate Amount: Large Exudate Type: Serous Exudate Color: amber r Cleansing: No No Wound Bed Granulation Amount: None Present (0%) Exposed Structure Necrotic Amount: Large (67-100%) Fascia Exposed: No Dossantos, Janmarie R. ([id number]) Necrotic Quality: Adherent Slough Fat Layer (Subcutaneous Tissue) Exposed: No Tendon  Exposed: No Muscle Exposed: No Joint Exposed: No Bone Exposed: No Limited to Skin Breakdown Periwound Skin Texture Texture Color No Abnormalities Noted: No No Abnormalities Noted: No Moisture Temperature / Pain No Abnormalities Noted: No Temperature: No Abnormality Tenderness on Palpation: Yes Wound Preparation Ulcer Cleansing: Rinsed/Irrigated with Saline, Other: soap and water, Topical Anesthetic Applied: Other: lidocaine 4%, Treatment Notes Wound #7 (Right, Lateral Malleolus) 1. Cleansed with: Clean wound with Normal Saline Cleanse wound with antibacterial soap and water 2. Anesthetic Topical Lidocaine 4% cream to wound bed prior to debridement 4. Dressing Applied: Prisma Ag 5. Secondary Dressing Applied ABD Pad 7. Secured with 3 Layer Compression System - Bilateral Electronic Signature(s) Signed: 01/11/2017 4:30:41 PM By: Alric Quan Entered By: Alric Quan on 01/11/2017 12:47:18 [name] ([id number]) -------------------------------------------------------------------------------- [place] Details Patient Name: [name] Date of Service: 01/11/2017 12:30  PM Medical Record Patient Account Number: [id number] Number: Treating RN: Ahmed Prima [birth year] (83 y.o. Other Clinician: Date of Birth/Sex: Female) Treating ROBSON, MICHAEL Primary Care Siria Calandro: Emily Filbert Belky Mundo/Extender: G Referring Yalena Colon: Melina Modena in Treatment: 1 Vital Signs Time Taken: 12:31 Temperature (F): 97.6 Height (in): 61 Pulse (bpm): 100 Weight (lbs): 206 Respiratory Rate (breaths/min): 16 Body Mass Index (BMI): 38.9 Blood Pressure (mmHg): 95/58 Reference Range: 80 - 120 mg / dl Electronic Signature(s) Signed: 01/11/2017 4:30:41 PM By: Alric Quan Entered By: Alric Quan on 01/11/2017 12:32:42

## 2017-01-13 NOTE — Progress Notes (Addendum)
SHEVON, VARLEY ([id number]) Visit Report for 01/11/2017 Chief Complaint Document Details Patient Name: [name], [name] Date of Service: 01/11/2017 12:30 PM Medical Record Patient Account Number: [id number] Number: Treating RN: Ahmed Prima 1933-12-21 (83 y.o. Other Clinician: Date of Birth/Sex: Female) Treating Donie Lemelin Primary Care Provider: Emily Filbert Provider/Extender: G Referring Provider: Melina Modena in Treatment: 1 Information Obtained from: Patient Chief Complaint 01/04/17; patient is here for review of wounds on her bilateral lower extremities referred from her primary physician Dr. Emily Filbert [facility] Electronic Signature(s) Signed: 01/11/2017 5:54:02 PM By: Linton Ham MD Entered By: Linton Ham on 01/11/2017 17:16:46 [name] ([id number]) -------------------------------------------------------------------------------- Debridement Details Patient Name: [name] Date of Service: 01/11/2017 12:30 PM Medical Record Patient Account Number: [id number] Number: Treating RN: Ahmed Prima 1934/02/20 (83 y.o. Other Clinician: Date of Birth/Sex: Female) Treating Javarus Dorner Primary Care Provider: Emily Filbert Provider/Extender: G Referring Provider: Melina Modena in Treatment: 1 Debridement Performed for Wound #1 Left,Dorsal Foot Assessment: Performed By: Physician Ricard Dillon, MD Debridement: Debridement Pre-procedure Yes - 13:08 Verification/Time Out Taken: Start Time: 13:09 Pain Control: Lidocaine 4% Topical Solution Level: Skin/Subcutaneous Tissue Total Area Debrided (L x 4 (cm) x 5.2 (cm) = 20.8 (cm) W): Tissue and other Viable, Non-Viable, Exudate, Fibrin/Slough, Subcutaneous material debrided: Instrument: Curette Bleeding: Moderate Hemostasis Achieved: Silver Nitrate End Time: 13:11 Procedural Pain: 0 Post Procedural Pain: 0 Response to Treatment: Procedure was tolerated  well Post Debridement Measurements of Total Wound Length: (cm) 4 Width: (cm) 5.2 Depth: (cm) 0.3 Volume: (cm) 4.901 Character of Wound/Ulcer Post Requires Further Debridement Debridement: Severity of Tissue Post Debridement: Fat layer exposed Post Procedure Diagnosis Same as Pre-procedure Electronic Signature(s) Signed: 01/11/2017 5:54:02 PM By: Linton Ham MD Signed: 01/12/2017 4:02:27 PM By: Osborn Coho ([id number]) Previous Signature: 01/11/2017 4:30:41 PM Version By: Alric Quan Entered By: Linton Ham on 01/11/2017 17:16:24 [name] ([id number]) -------------------------------------------------------------------------------- Debridement Details Patient Name: [name] Date of Service: 01/11/2017 12:30 PM Medical Record Patient Account Number: [id number] Number: Treating RN: Ahmed Prima 1934/11/18 (83 y.o. Other Clinician: Date of Birth/Sex: Female) Treating Shannel Zahm Primary Care Provider: Emily Filbert Provider/Extender: G Referring Provider: Melina Modena in Treatment: 1 Debridement Performed for Wound #2 Right,Dorsal Foot Assessment: Performed By: Physician Ricard Dillon, MD Debridement: Debridement Pre-procedure Yes - 13:08 Verification/Time Out Taken: Start Time: 13:11 Pain Control: Lidocaine 4% Topical Solution Level: Skin/Subcutaneous Tissue Total Area Debrided (L x 6 (cm) x 4.3 (cm) = 25.8 (cm) W): Tissue and other Viable, Non-Viable, Exudate, Fibrin/Slough, Subcutaneous material debrided: Instrument: Curette Bleeding: Moderate Hemostasis Achieved: Silver Nitrate End Time: 13:13 Procedural Pain: 0 Post Procedural Pain: 0 Response to Treatment: Procedure was tolerated well Post Debridement Measurements of Total Wound Length: (cm) 6 Width: (cm) 4.3 Depth: (cm) 0.3 Volume: (cm) 6.079 Character of Wound/Ulcer Post Requires Further Debridement Debridement: Severity of Tissue  Post Debridement: Fat layer exposed Post Procedure Diagnosis Same as Pre-procedure Electronic Signature(s) Signed: 01/11/2017 5:54:02 PM By: Linton Ham MD Signed: 01/12/2017 4:02:27 PM By: Osborn Coho ([id number]) Previous Signature: 01/11/2017 4:30:41 PM Version By: Alric Quan Entered By: Linton Ham on 01/11/2017 17:16:37 [name] ([id number]) -------------------------------------------------------------------------------- HPI Details Patient Name: [name] Date of Service: 01/11/2017 12:30 PM Medical Record Patient Account Number: [id number] Number: Treating RN: Ahmed Prima December 07, 1934 (83 y.o. Other Clinician: Date of Birth/Sex: Female) Treating Weslee Fogg Primary Care Provider: Emily Filbert Provider/Extender: G Referring  Provider: Melina Modena in Treatment: 1 History of Present Illness HPI Description: 01/04/17; this is a 83 year old fairly disabled woman who comes accompanied by her husband today. She is here for review of wounds on her bilateral feet and right calf. There is a hopeful note from Dr. Sabra Heck that is included. The patient is a type II diabetic with end-stage renal failure on dialysis, she is here for bilateral wounds in her feet and heels as well as the right calf wound posteriorly. The history here is a bit difficult to follow. Her husband states that she had a fracture of her leg in October. Indeed looking [facility] she had a closed nondisplaced fracture of the lateral condyle of the right femur. This was managed nonoperatively with a knee brace. Her husband thinks that the brace itself caused the wounds on the right leg although that would not of caused ones on the left. Apparently the wounds have been there for 2 months and the patient is being followed by [facility] with bilateral Unna boots. She is also followed in Dr. Ozella Almond office of vascular surgery. She had recent  arterial studies that showed biphasic waveforms bilaterally with a ABI on the right of 1.01 on the left at 0.93. T ABIs on the right at 0.72 and on the left at 0.75. He was not felt to have significant arterial disease. I am also not clear what [facility] was primarily dressing the wounds with under the wraps the patient had an admission on 10/17 with an acute right leg DVT status post IVC filter placement and was placed on Eliquis for 6 weeks only. 01/11/17; the x-rays that I ordered last week on this patient have been completed the left is the problem area. She is had a previous distal tib-fib fracture which appears to be better than the last imaging view I can find in September. She appears to have osteomyelitis of the lateral aspect of the talus. Left foot showed bone resorption and loss of cortical Along the medial margin of the navicular and medial cuneiform consistent with on the osteomyelitis. rRght foot and ankle showed diffuse soft tissue swelling without obvious focal bone destruction there was a suggestion of a right fourth metatarsal fracture. boen I actually tried to get more history out of the patient and her husband who is present I've also spoken to her son on the phone. It would appear that the deep area on the lateral aspect of her right knee was abrasive injury also the area on the lateral aspect just above the ankle. The she also probably consistent with pressure ulcers and dorsal ankle extensive wounds probably wrap injuries although I am guessing about some of this. She does not have an arterial issue per Dr. dew. Her left tib-fib fracture was apparently in April as well she had a left femur fracture sometime in 2014 2015. She has a rod in her left femur hopefully we can get her through an MRI. I looked through [facility] back to 2009 I don't actually see an x-ray of the left femur Electronic Signature(s) Signed: 01/11/2017 5:54:02 PM By: Linton Ham  MD Ritchey, [name] ([id number]) Entered By: Linton Ham on 01/11/2017 17:23:54 [name] ([id number]) -------------------------------------------------------------------------------- Physical Exam Details Patient Name: [name] Date of Service: 01/11/2017 12:30 PM Medical Record Patient Account Number: [id number] Number: Treating RN: Ahmed Prima [birth year] (83 y.o. Other Clinician: Date of Birth/Sex: Female) Treating Fredrika Canby Primary Care Provider: Emily Filbert Provider/Extender: Darnell Level  Referring Provider: Melina Modena in Treatment: 1 Constitutional Patient is hypotensive. She is on chronic dialysis. Pulse regular and within target range for patient.. Temperature is normal and within the target range for the patient.. Patient's appearance is neat and clean. Appears in no acute distress. Well nourished and well developed.Marland Kitchen Respiratory Respiratory effort is easy and symmetric bilaterally. Rate is normal at rest and on room air.. . Cardiovascular She appears to be euvolemic. Pedal pulses palpable and strong bilaterally.. Notes Wound exam; the patient has colossal wounds over both dorsal feet these are necrotic underwent debridement with a #5 curet removing necrotic surface material hemostasis was silver nitrate oShe has a large black eschar over the left heel I did not attempt to debridement this oDeep wound over the right Achilles with exposed Achilles tendon. oSmall wound over the right lateral ankle/lower leg this appears to be stable. Deep wound over the right lateral leg just above the knee. Electronic Signature(s) Signed: 01/11/2017 5:54:02 PM By: Linton Ham MD Entered By: Linton Ham on 01/11/2017 17:26:00 [name] ([id number]) -------------------------------------------------------------------------------- Physician Orders Details Patient Name: [name] Date of Service: 01/11/2017 12:30 PM Medical Record Patient Account  Number: [id number] Number: Treating RN: Ahmed Prima 1934-09-24 (83 y.o. Other Clinician: Date of Birth/Sex: Female) Treating Marasia Newhall Primary Care Provider: Emily Filbert Provider/Extender: G Referring Provider: Melina Modena in Treatment: 1 Verbal / Phone Orders: Yes Clinician: Carolyne Fiscal, Debi Read Back and Verified: Yes Diagnosis Coding Wound Cleansing Wound #1 Left,Dorsal Foot o Clean wound with Normal Saline. o Cleanse wound with mild soap and water Wound #2 Right,Dorsal Foot o Clean wound with Normal Saline. o Cleanse wound with mild soap and water Wound #3 Left Calcaneus o Clean wound with Normal Saline. o Cleanse wound with mild soap and water Wound #4 Right Calcaneus o Clean wound with Normal Saline. o Cleanse wound with mild soap and water Wound #5 Right,Anterior Lower Leg o Clean wound with Normal Saline. o Cleanse wound with mild soap and water Wound #6 Right,Posterior Lower Leg o Clean wound with Normal Saline. o Cleanse wound with mild soap and water Wound #7 Right,Lateral Malleolus o Clean wound with Normal Saline. o Cleanse wound with mild soap and water Anesthetic Wound #1 Left,Dorsal Foot o Topical Lidocaine 4% cream applied to wound bed prior to debridement - for clinic use only Wound #2 Right,Dorsal Foot o Topical Lidocaine 4% cream applied to wound bed prior to debridement - for clinic use only ELLANORA, TANIS R. ([id number]) Wound #3 Left Calcaneus o Topical Lidocaine 4% cream applied to wound bed prior to debridement - for clinic use only Wound #4 Right Calcaneus o Topical Lidocaine 4% cream applied to wound bed prior to debridement - for clinic use only Wound #5 Right,Anterior Lower Leg o Topical Lidocaine 4% cream applied to wound bed prior to debridement - for clinic use only Wound #6 Right,Posterior Lower Leg o Topical Lidocaine 4% cream applied to wound bed prior to debridement - for  clinic use only Wound #7 Right,Lateral Malleolus o Topical Lidocaine 4% cream applied to wound bed prior to debridement - for clinic use only Primary Wound Dressing Wound #1 Left,Dorsal Foot o Iodoflex Wound #2 Right,Dorsal Foot o Iodoflex Wound #3 Left Calcaneus o Medihoney gel Wound #4 Right Calcaneus o Medihoney gel Wound #5 Right,Anterior Lower Leg o Prisma Ag Wound #6 Right,Posterior Lower Leg o Prisma Ag Wound #7 Right,Lateral Malleolus o Prisma Ag Secondary Dressing Wound #1 Left,Dorsal Foot o ABD pad o  Dry Gauze o Drawtex Wound #2 Right,Dorsal Foot o ABD pad o Dry Gauze o Drawtex Fraticelli, Dafne R. ([id number]) Wound #3 Left Calcaneus o ABD pad o Dry Gauze Wound #4 Right Calcaneus o ABD pad o Dry Gauze Wound #5 Right,Anterior Lower Leg o ABD pad o Dry Gauze Wound #6 Right,Posterior Lower Leg o ABD pad o Dry Gauze o Drawtex Wound #7 Right,Lateral Malleolus o ABD pad o Dry Gauze Dressing Change Frequency Wound #1 Left,Dorsal Foot o Three times weekly - HHRN change wraps on Thursdays and pt will have this changed on Tuesdays in the [facility] Wound #2 Right,Dorsal Foot o Three times weekly - HHRN change wraps on Thursdays and pt will have this changed on Tuesdays in the [facility] Wound #3 Left Calcaneus o Three times weekly - HHRN change wraps on Thursdays and pt will have this changed on Tuesdays in the [facility] Wound #4 Right Calcaneus o Three times weekly - HHRN change wraps on Thursdays and pt will have this changed on Tuesdays in the [facility] Wound #5 Right,Anterior Lower Leg o Three times weekly - HHRN change wraps on Thursdays and pt will have this changed on Tuesdays in the [facility] Wound #6 Right,Posterior Lower Leg o Three times weekly - HHRN change wraps on Thursdays and pt will have this changed on Tuesdays in the [facility] Wound #7  Right,Lateral Malleolus KRYSTELLE, FOSKETT R. ([id number]) o Three times weekly - HHRN change wraps on Thursdays and pt will have this changed on Tuesdays in the [facility] Follow-up Appointments Wound #1 Left,Dorsal Foot o Return Appointment in 1 week. Wound #2 Right,Dorsal Foot o Return Appointment in 1 week. Wound #3 Left Calcaneus o Return Appointment in 1 week. Wound #4 Right Calcaneus o Return Appointment in 1 week. Wound #5 Right,Anterior Lower Leg o Return Appointment in 1 week. Wound #6 Right,Posterior Lower Leg o Return Appointment in 1 week. Edema Control Wound #1 Left,Dorsal Foot o 3 Layer Compression System - Bilateral o Elevate legs to the level of the heart and pump ankles as often as possible Wound #2 Right,Dorsal Foot o 3 Layer Compression System - Bilateral o Elevate legs to the level of the heart and pump ankles as often as possible Wound #3 Left Calcaneus o 3 Layer Compression System - Bilateral o Elevate legs to the level of the heart and pump ankles as often as possible Wound #4 Right Calcaneus o 3 Layer Compression System - Bilateral o Elevate legs to the level of the heart and pump ankles as often as possible Wound #5 Right,Anterior Lower Leg o 3 Layer Compression System - Bilateral o Elevate legs to the level of the heart and pump ankles as often as possible Wound #6 Right,Posterior Lower Leg o 3 Layer Compression System - Bilateral o Elevate legs to the level of the heart and pump ankles as often as possible Wound #7 Right,Lateral Malleolus o 3 Layer Compression System - Bilateral Creely, Karisha R. ([id number]) o Elevate legs to the level of the heart and pump ankles as often as possible Off-Loading Wound #1 Left,Dorsal Foot o Turn and reposition every 2 hours o Other: - float heels above heart level while in the bed Wound #2 Right,Dorsal Foot o Turn and reposition every 2 hours o Other: - float heels  above heart level while in the bed Wound #3 Left Calcaneus o Turn and reposition every 2 hours o Other: - float heels above heart  level while in the bed Wound #4 Right Calcaneus o Turn and reposition every 2 hours o Other: - float heels above heart level while in the bed Wound #5 Right,Anterior Lower Leg o Turn and reposition every 2 hours o Other: - float heels above heart level while in the bed Wound #6 Right,Posterior Lower Leg o Turn and reposition every 2 hours o Other: - float heels above heart level while in the bed Wound #7 Right,Lateral Malleolus o Turn and reposition every 2 hours o Other: - float heels above heart level while in the bed Additional Orders / Instructions Wound #1 Left,Dorsal Foot o Increase protein intake. Wound #2 Right,Dorsal Foot o Increase protein intake. Wound #3 Left Calcaneus o Increase protein intake. Wound #4 Right Calcaneus o Increase protein intake. Wound #5 Right,Anterior Lower Leg o Increase protein intake. Wound #6 Right,Posterior Lower Leg Manny, Josue R. ([id number]) o Increase protein intake. Wound #7 Right,Lateral Malleolus o Increase protein intake. Home Health Wound #1 [place] Visits o Home Health Nurse may visit PRN to address patientos wound care needs. o FACE TO FACE ENCOUNTER: MEDICARE and MEDICAID PATIENTS: I certify that this patient is under my care and that I had a face-to-face encounter that meets the physician face-to-face encounter requirements with this patient on this date. The encounter with the patient was in whole or in part for the following MEDICAL CONDITION: (primary reason for [place]) MEDICAL NECESSITY: I certify, that based on my findings, NURSING services are a medically necessary home health service. HOME BOUND STATUS: I certify that my clinical findings support that this patient is homebound (i.e., Due to illness or injury, pt  requires aid of supportive devices such as crutches, cane, wheelchairs, walkers, the use of special transportation or the assistance of another person to leave their place of residence. There is a normal inability to leave the home and doing so requires considerable and taxing effort. Other absences are for medical reasons / religious services and are infrequent or of short duration when for other reasons). o If current dressing causes regression in wound condition, may D/C ordered dressing product/s and apply Normal Saline Moist Dressing daily until next [place] / Other MD appointment. [place] of regression in wound condition at [phone]. o Please direct any NON-WOUND related issues/requests for orders to patient's Primary Care Physician Wound #2 [place] Nurse may visit PRN to address patientos wound care needs. o FACE TO FACE ENCOUNTER: MEDICARE and MEDICAID PATIENTS: I certify that this patient is under my care and that I had a face-to-face encounter that meets the physician face-to-face encounter requirements with this patient on this date. The encounter with the patient was in whole or in part for the following MEDICAL CONDITION: (primary reason for [place]) MEDICAL NECESSITY: I certify, that based on my findings, NURSING services are a medically necessary home health service. HOME BOUND STATUS: I certify that my clinical findings support that this patient is homebound (i.e., Due to illness or injury, pt requires aid of supportive devices such as crutches, cane, wheelchairs, walkers, the use of special transportation or the assistance of another person to leave their place of residence. There is a normal inability to leave the home and doing so requires considerable and taxing effort. Other absences are for medical reasons / religious services and are infrequent or of short  duration when for other reasons). o If current dressing  causes regression in wound condition, may D/C ordered dressing product/s and apply Normal Saline Moist Dressing daily until next [place] / Other MD appointment. [place] of regression in wound condition at [phone]. o Please direct any NON-WOUND related issues/requests for orders to patient's Primary Care Physician Wound #3 Left [place] Visits ICYSS, WHISONANT ([id number] Nurse may visit PRN to address patientos wound care needs. o FACE TO FACE ENCOUNTER: MEDICARE and MEDICAID PATIENTS: I certify that this patient is under my care and that I had a face-to-face encounter that meets the physician face-to-face encounter requirements with this patient on this date. The encounter with the patient was in whole or in part for the following MEDICAL CONDITION: (primary reason for [place]) MEDICAL NECESSITY: I certify, that based on my findings, NURSING services are a medically necessary home health service. HOME BOUND STATUS: I certify that my clinical findings support that this patient is homebound (i.e., Due to illness or injury, pt requires aid of supportive devices such as crutches, cane, wheelchairs, walkers, the use of special transportation or the assistance of another person to leave their place of residence. There is a normal inability to leave the home and doing so requires considerable and taxing effort. Other absences are for medical reasons / religious services and are infrequent or of short duration when for other reasons). o If current dressing causes regression in wound condition, may D/C ordered dressing product/s and apply Normal Saline Moist Dressing daily until next [place] / Other MD appointment. [place] of regression in wound condition at [phone]. o Please direct any NON-WOUND related  issues/requests for orders to patient's Primary Care Physician Wound #4 Right [place] Nurse may visit PRN to address patientos wound care needs. o FACE TO FACE ENCOUNTER: MEDICARE and MEDICAID PATIENTS: I certify that this patient is under my care and that I had a face-to-face encounter that meets the physician face-to-face encounter requirements with this patient on this date. The encounter with the patient was in whole or in part for the following MEDICAL CONDITION: (primary reason for [place]) MEDICAL NECESSITY: I certify, that based on my findings, NURSING services are a medically necessary home health service. HOME BOUND STATUS: I certify that my clinical findings support that this patient is homebound (i.e., Due to illness or injury, pt requires aid of supportive devices such as crutches, cane, wheelchairs, walkers, the use of special transportation or the assistance of another person to leave their place of residence. There is a normal inability to leave the home and doing so requires considerable and taxing effort. Other absences are for medical reasons / religious services and are infrequent or of short duration when for other reasons). o If current dressing causes regression in wound condition, may D/C ordered dressing product/s and apply Normal Saline Moist Dressing daily until next [place] / Other MD appointment. [place] of regression in wound condition at [phone]. o Please direct any NON-WOUND related issues/requests for orders to patient's Primary Care Physician Wound #5 [place] Nurse may visit PRN to address patientos wound care needs. o FACE TO FACE ENCOUNTER: MEDICARE and MEDICAID PATIENTS: I certify that this patient is under my care and that I had a face-to-face encounter that meets the physician  face-to-face encounter requirements with this patient on this date.  The encounter with the patient was in whole or in part for the following MEDICAL CONDITION: (primary reason for [place]) MEDICAL NECESSITY: I certify, that based on my findings, NURSING services are a medically necessary home health service. HOME BOUND STATUS: I certify that my clinical findings support that this patient is homebound (i.e., Due to illness or injury, pt requires aid of SUKHPREET, FIECHTNER. ([id number]) supportive devices such as crutches, cane, wheelchairs, walkers, the use of special transportation or the assistance of another person to leave their place of residence. There is a normal inability to leave the home and doing so requires considerable and taxing effort. Other absences are for medical reasons / religious services and are infrequent or of short duration when for other reasons). o If current dressing causes regression in wound condition, may D/C ordered dressing product/s and apply Normal Saline Moist Dressing daily until next [place] / Other MD appointment. [place] of regression in wound condition at [phone]. o Please direct any NON-WOUND related issues/requests for orders to patient's Primary Care Physician Wound #6 [place] Nurse may visit PRN to address patientos wound care needs. o FACE TO FACE ENCOUNTER: MEDICARE and MEDICAID PATIENTS: I certify that this patient is under my care and that I had a face-to-face encounter that meets the physician face-to-face encounter requirements with this patient on this date. The encounter with the patient was in whole or in part for the following MEDICAL CONDITION: (primary reason for [place]) MEDICAL NECESSITY: I certify, that based on my findings, NURSING services are a medically necessary home health service. HOME BOUND STATUS: I certify  that my clinical findings support that this patient is homebound (i.e., Due to illness or injury, pt requires aid of supportive devices such as crutches, cane, wheelchairs, walkers, the use of special transportation or the assistance of another person to leave their place of residence. There is a normal inability to leave the home and doing so requires considerable and taxing effort. Other absences are for medical reasons / religious services and are infrequent or of short duration when for other reasons). o If current dressing causes regression in wound condition, may D/C ordered dressing product/s and apply Normal Saline Moist Dressing daily until next [place] / Other MD appointment. [place] of regression in wound condition at [phone]. o Please direct any NON-WOUND related issues/requests for orders to patient's Primary Care Physician Wound #7 [place] Nurse may visit PRN to address patientos wound care needs. o FACE TO FACE ENCOUNTER: MEDICARE and MEDICAID PATIENTS: I certify that this patient is under my care and that I had a face-to-face encounter that meets the physician face-to-face encounter requirements with this patient on this date. The encounter with the patient was in whole or in part for the following MEDICAL CONDITION: (primary reason for [place]) MEDICAL NECESSITY: I certify, that based on my findings, NURSING services are a medically necessary home health service. HOME BOUND STATUS: I certify that my clinical findings support that this patient is homebound (i.e., Due to illness or injury, pt requires aid of supportive devices such as crutches, cane, wheelchairs, walkers, the use of special transportation or the assistance of another person to leave their place of residence. There is a normal inability to leave the home and doing so requires considerable and  taxing effort. Other absences are for medical  reasons / religious services and are infrequent or of short duration when for other reasons). o If current dressing causes regression in wound condition, may D/C ordered dressing product/s and apply Normal Saline Moist Dressing daily until next [place] / Other MD appointment. [place] of regression in wound condition at [phone]. TMYA, PIZZOFERRATO ([id number]) o Please direct any NON-WOUND related issues/requests for orders to patient's Primary Care Physician Medications-please add to medication list. Wound #1 Left,Dorsal Foot o Other: - Vitamin C, Zinc, MVI Wound #2 Right,Dorsal Foot o Other: - Vitamin C, Zinc, MVI Wound #3 Left Calcaneus o Other: - Vitamin C, Zinc, MVI Wound #4 Right Calcaneus o Other: - Vitamin C, Zinc, MVI Wound #5 Right,Anterior Lower Leg o Other: - Vitamin C, Zinc, MVI Wound #6 Right,Posterior Lower Leg o Other: - Vitamin C, Zinc, MVI Wound #7 Right,Lateral Malleolus o Other: - Vitamin C, Zinc, MVI Radiology o MRI, lower extremity without contrast - bilateral feet, ankles Electronic Signature(s) Signed: 01/12/2017 4:02:27 PM By: Alric Quan Signed: 01/12/2017 5:08:44 PM By: Linton Ham MD Previous Signature: 01/11/2017 4:30:41 PM Version By: Alric Quan Previous Signature: 01/11/2017 5:54:02 PM Version By: Linton Ham MD Entered By: Alric Quan on 01/12/2017 11:22:31 [name] ([id number]) -------------------------------------------------------------------------------- Problem List Details Patient Name: [name] Date of Service: 01/11/2017 12:30 PM Medical Record Patient Account Number: [id number] Number: Treating RN: Ahmed Prima 10-14-1934 (83 y.o. 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 without evidence of necrosis L97.515 Non-pressure chronic ulcer of other part of right foot with 01/04/2017 Yes muscle involvement without evidence of necrosis L89.613 Pressure ulcer of right heel, stage 3 01/04/2017 Yes L89.620 Pressure ulcer of left heel, unstageable 01/04/2017 Yes 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 Other Clinician: Date of Birth/Sex: Female) Treating Devaunte Gasparini Primary Care Provider: Emily Filbert Provider/Extender: G Referring Provider: Melina Modena in Treatment: 1 Subjective Chief Complaint Information obtained from Patient 01/04/17; patient is here for review of wounds on her bilateral lower extremities referred from her primary physician Dr. Emily Fil[facility] History of Present Illness (HPI) 01/04/17; this is a 83 year old fairly disabled woman who comes accompanied by her husband today. She is here for review of wounds on her bilateral feet and right calf. There is a hopeful note from Dr. Sabra Heck that is included. 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 The history here is a bit difficult to follow. Her husband states that she had a fracture of her leg in October. Indeed looking [facility] she had a closed nondisplaced fracture of the lateral condyle  of the right femur. This was managed nonoperatively with a knee brace. Her husband thinks that the brace itself caused the wounds on the right leg although that would not of caused ones on the left. Apparently the wounds have been there for 2 months and the patient is being followed by [facility] with bilateral Unna boots. She is also followed in Dr. Ozella Almond office of vascular surgery. She had recent arterial studies that showed biphasic waveforms bilaterally with a ABI on the right of 1.01 on the left at 0.93. T ABIs on the right at 0.72 and on the left at 0.75. He was not felt to have significant arterial disease. I am also not clear what [facility] was primarily dressing the wounds with under the wraps the patient had an admission on 10/17 with an acute right leg DVT status post IVC filter placement and was placed on Eliquis for 6 weeks only. 01/11/17; the x-rays that I ordered last week on this patient have been completed the left is the problem area. She is had a previous distal tib-fib fracture which appears to be better than the last imaging view I can find in September. She appears to have osteomyelitis of the lateral aspect of the talus. Left foot showed bone resorption and loss of cortical Along the medial margin of the navicular and medial cuneiform consistent with on the osteomyelitis. rRght foot and ankle showed diffuse soft tissue swelling without obvious focal bone destruction there was a suggestion of a right fourth metatarsal fracture. boen I actually tried to get more history out of the patient and her husband who is present I've also  spoken to her son on the phone. It would appear that the deep area on the lateral aspect of her right knee was abrasive injury also the area on the lateral aspect just above the ankle. The she also probably consistent with pressure ulcers and dorsal ankle extensive wounds probably wrap injuries although I am guessing about some of this. She does not have an arterial issue per Dr. dew. Her left tib-fib fracture was apparently in April [place], [place] ([id number]) as well she had a left femur fracture sometime in 2014 2015. She has a rod in her left femur hopefully we can get her through an MRI. I looked through [facility] back to 2009 I don't actually see an x-ray of the left femur Objective Constitutional Patient is hypotensive. She is on chronic dialysis. Pulse regular and within target range for patient.. Temperature is normal and within the target range for the patient.. Patient's appearance is neat and clean. Appears in no acute distress. Well nourished and well developed.. Vitals Time Taken: 12:31 PM, Height: 61 in, Weight: 206 lbs, BMI: 38.9, Temperature: 97.6 F, Pulse: 100 bpm, Respiratory Rate: 16 breaths/min, Blood Pressure: 95/58 mmHg. Respiratory Respiratory effort is easy and symmetric bilaterally. Rate is normal at rest and on room air.. Cardiovascular She appears to be euvolemic. Pedal pulses palpable and strong bilaterally.. General Notes: Wound exam; the patient has colossal wounds over both dorsal feet these are necrotic underwent debridement with a #5 curet removing necrotic surface material hemostasis was silver nitrate She has a large black eschar over the left heel I did not attempt to debridement this Deep wound over the right Achilles with exposed Achilles tendon. Small wound over the right lateral ankle/lower leg this appears to be stable. Deep wound over the right lateral leg just above the knee. Integumentary (Hair, Skin) Wound #1 status is Open.  Original  cause of wound was Gradually Appeared. The wound is located on the Left,Dorsal Foot. The wound measures 4cm length x 5.2cm width x 0.2cm depth; 16.336cm^2 area and 3.267cm^3 volume. Wound #2 status is Open. Original cause of wound was Gradually Appeared. The wound is located on the Right,Dorsal Foot. The wound measures 6cm length x 4.3cm width x 0.2cm depth; 20.263cm^2 area and 4.053cm^3 volume. The wound is limited to skin breakdown. There is no tunneling or undermining noted. There is a large amount of purulent drainage noted. The wound margin is distinct with the outline attached to the wound base. There is small (1-33%) pink granulation within the wound bed. There is a large (67- 100%) amount of necrotic tissue within the wound bed including Eschar and Adherent Slough. The periwound skin appearance exhibited: Maceration, Erythema. The surrounding wound skin color is noted with erythema which is circumferential. Periwound temperature was noted as No Abnormality. The periwound has tenderness on palpation. Wound #3 status is Open. Original cause of wound was Pressure Injury. The wound is located on the Left Calcaneus. The wound measures 4cm length x 9cm width x 0.1cm depth; 28.274cm^2 area and 2.827cm^3 [place], Parisa R. ([id number]) volume. There is no tunneling or undermining noted. There is a large amount of serous drainage noted. The wound margin is distinct with the outline attached to the wound base. There is no granulation within the wound bed. There is a large (67-100%) amount of necrotic tissue within the wound bed including Eschar. The periwound skin appearance exhibited: Erythema. The surrounding wound skin color is noted with erythema which is circumferential. Periwound temperature was noted as No Abnormality. The periwound has tenderness on palpation. Wound #4 status is Open. Original cause of wound was Pressure Injury. The wound is located on the Right Calcaneus. The wound measures  5.3cm length x 2.7cm width x 0.2cm depth; 11.239cm^2 area and 2.248cm^3 volume. There is no tunneling or undermining noted. There is a large amount of purulent drainage noted. The wound margin is distinct with the outline attached to the wound base. There is no granulation within the wound bed. There is a large (67-100%) amount of necrotic tissue within the wound bed including Eschar and Adherent Slough. The periwound skin appearance exhibited: Erythema. The surrounding wound skin color is noted with erythema which is circumferential. Periwound temperature was noted as No Abnormality. The periwound has tenderness on palpation. Wound #5 status is Open. Original cause of wound was Gradually Appeared. The wound is located on the Right,Anterior Lower Leg. The wound measures 1.2cm length x 0.3cm width x 0.1cm depth; 0.283cm^2 area and 0.028cm^3 volume. There is no tunneling or undermining noted. There is a large amount of serosanguineous drainage noted. The wound margin is distinct with the outline attached to the wound base. There is large (67-100%) red granulation within the wound bed. There is a small (1-33%) amount of necrotic tissue within the wound bed including Eschar. Periwound temperature was noted as No Abnormality. The periwound has tenderness on palpation. Wound #6 status is Open. Original cause of wound was Gradually Appeared. The wound is located on the Right,Posterior Lower Leg. The wound measures 5cm length x 1.8cm width x 1.3cm depth; 7.069cm^2 area and 9.189cm^3 volume. There is no tunneling or undermining noted. There is a large amount of purulent drainage noted. The wound margin is distinct with the outline attached to the wound base. There is no granulation within the wound bed. There is a large (67-100%) amount of necrotic tissue within the  wound bed including Adherent Slough. The periwound skin appearance exhibited: Maceration, Erythema. The surrounding wound skin color is noted  with erythema which is circumferential. Periwound temperature was noted as No Abnormality. The periwound has tenderness on palpation. Wound #7 status is Open. Original cause of wound was Gradually Appeared. The wound is located on the Right,Lateral Malleolus. The wound measures 0.8cm length x 0.8cm width x 0.3cm depth; 0.503cm^2 area and 0.151cm^3 volume. The wound is limited to skin breakdown. There is no tunneling or undermining noted. There is a large amount of serous drainage noted. The wound margin is distinct with the outline attached to the wound base. There is no granulation within the wound bed. There is a large (67-100%) amount of necrotic tissue within the wound bed including Adherent Slough. Periwound temperature was noted as No Abnormality. The periwound has tenderness on palpation. Assessment Active Problems ICD-10 E11.621 - Type 2 diabetes mellitus with foot ulcer Rabelo, Zi R. 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 There was a Skin/Subcutaneous Tissue Debridement [id number]) debridement with total area of 20.8 sq cm performed by Ricard Dillon, MD. with the following instrument(s): Curette to remove Viable and Non-Viable tissue/material including Exudate, Fibrin/Slough, and Subcutaneous after achieving pain control using Lidocaine 4% Topical Solution. A time out was conducted at 13:08, prior to the start of the  procedure. A Moderate amount of bleeding was controlled with Silver Nitrate. The procedure was tolerated well with a pain level of 0 throughout and a pain level of 0 following the procedure. Post Debridement Measurements: 4cm length x 5.2cm width x 0.3cm depth; 4.901cm^3 volume. Character of Wound/Ulcer Post Debridement requires further debridement. Severity of Tissue Post Debridement is: Fat layer exposed. Post procedure Diagnosis Wound #1: Same as Pre-Procedure Wound #2 Wound #2 is a Diabetic Wound/Ulcer of the Lower Extremity located on the Right,Dorsal Foot . There was a Skin/Subcutaneous Tissue Debridement [id number]) debridement with total area of 25.8 sq cm performed by Ricard Dillon, MD. with the following instrument(s): Curette to remove Viable and Non-Viable tissue/material including Exudate, Fibrin/Slough, and Subcutaneous after achieving pain control using Lidocaine 4% Topical Solution. A time out was conducted at 13:08, prior to the start of the procedure. A Moderate amount of bleeding was controlled with Silver Nitrate. The procedure was tolerated well with a pain level of 0 throughout and a pain level of 0 following the procedure. Post Debridement Measurements: 6cm length x 4.3cm width x 0.3cm depth; 6.079cm^3 volume. Character of Wound/Ulcer Post Debridement requires further debridement. Severity of Tissue Post Debridement is: Fat layer exposed. Post procedure Diagnosis Wound #2: Same as Pre-Procedure Plan RUBBY, KOUNTZ R. ([id number]) Wound Cleansing: Wound #1 Left,Dorsal Foot: Clean wound with Normal Saline. Cleanse wound with mild soap and water Wound #2 Right,Dorsal Foot: Clean wound with Normal Saline. Cleanse wound with mild soap and water Wound #3 Left Calcaneus: Clean wound with Normal Saline. Cleanse wound with mild soap and water Wound #4 Right Calcaneus: Clean wound with Normal Saline. Cleanse wound  with mild soap and water Wound #5 Right,Anterior Lower  Leg: Clean wound with Normal Saline. Cleanse wound with mild soap and water Wound #6 Right,Posterior Lower Leg: Clean wound with Normal Saline. Cleanse wound with mild soap and water Wound #7 Right,Lateral Malleolus: Clean wound with Normal Saline. Cleanse wound with mild soap and water Anesthetic: Wound #1 Left,Dorsal Foot: Topical Lidocaine 4% cream applied to wound bed prior to debridement - for clinic use only Wound #2 Right,Dorsal Foot: Topical Lidocaine 4% cream applied to wound bed prior to debridement - for clinic use only Wound #3 Left Calcaneus: Topical Lidocaine 4% cream applied to wound bed prior to debridement - for clinic use only Wound #4 Right Calcaneus: Topical Lidocaine 4% cream applied to wound bed prior to debridement - for clinic use only Wound #5 Right,Anterior Lower Leg: Topical Lidocaine 4% cream applied to wound bed prior to debridement - for clinic use only Wound #6 Right,Posterior Lower Leg: Topical Lidocaine 4% cream applied to wound bed prior to debridement - for clinic use only Wound #7 Right,Lateral Malleolus: Topical Lidocaine 4% cream applied to wound bed prior to debridement - for clinic use only Primary Wound Dressing: Wound #1 Left,Dorsal Foot: Iodoflex Wound #2 Right,Dorsal Foot: Iodoflex Wound #3 Left Calcaneus: Medihoney gel Wound #4 Right Calcaneus: Medihoney gel Wound #5 Right,Anterior Lower Leg: Prisma Ag Wound #6 Right,Posterior Lower Leg: SHAVONDRA, MACADAMS R. ([id number]) Wound #7 Right,Lateral Malleolus: Prisma Ag Secondary Dressing: Wound #1 Left,Dorsal Foot: ABD pad Dry Gauze Drawtex Wound #2 Right,Dorsal Foot: ABD pad Dry Gauze Drawtex Wound #3 Left Calcaneus: ABD pad Dry Gauze Wound #4 Right Calcaneus: ABD pad Dry Gauze Wound #5 Right,Anterior Lower Leg: ABD pad Dry Gauze Wound #6 Right,Posterior Lower Leg: ABD pad Dry Gauze Drawtex Wound #7 Right,Lateral Malleolus: ABD pad Dry Gauze Dressing Change  Frequency: Wound #1 Left,Dorsal Foot: Three times weekly - HHRN change wraps on Thursdays and pt will have this changed on Tuesdays in the [facility] Wound #2 Right,Dorsal Foot: Three times weekly - HHRN change wraps on Thursdays and pt will have this changed on Tuesdays in the [facility] Wound #3 Left Calcaneus: Three times weekly - HHRN change wraps on Thursdays and pt will have this changed on Tuesdays in the [facility] Wound #4 Right Calcaneus: Three times weekly - HHRN change wraps on Thursdays and pt will have this changed on Tuesdays in the [facility] Wound #5 Right,Anterior Lower Leg: Three times weekly - HHRN change wraps on Thursdays and pt will have this changed on Tuesdays in the [facility] Wound #6 Right,Posterior Lower Leg: Three times weekly - HHRN change wraps on Thursdays and pt will have this changed on Tuesdays in the [place] #7 Right,Lateral Malleolus: Three times weekly - HHRN change wraps on Thursdays and pt will have this changed on Tuesdays in the [facility] Follow-up Appointments: Wound #1 Left,Dorsal Foot: CARAMIA, FETTIG R. ([id number]) Return Appointment in 1 week. Wound #2 Right,Dorsal Foot: Return Appointment in 1 week. Wound #3 Left Calcaneus: Return Appointment in 1 week. Wound #4 Right Calcaneus: Return Appointment in 1 week. Wound #5 Right,Anterior Lower Leg: Return Appointment in 1 week. Wound #6 Right,Posterior Lower Leg: Return Appointment in 1 week. Edema Control: Wound #1 Left,Dorsal Foot: 3 Layer Compression System - Bilateral Elevate legs to the level of the heart and pump ankles as often as possible Wound #2 Right,Dorsal Foot: 3 Layer Compression System - Bilateral Elevate legs  to the level of the heart and pump ankles as often as possible Wound #3 Left Calcaneus: 3 Layer Compression System - Bilateral Elevate legs to the level of the heart and pump ankles as often as  possible Wound #4 Right Calcaneus: 3 Layer Compression System - Bilateral Elevate legs to the level of the heart and pump ankles as often as possible Wound #5 Right,Anterior Lower Leg: 3 Layer Compression System - Bilateral Elevate legs to the level of the heart and pump ankles as often as possible Wound #6 Right,Posterior Lower Leg: 3 Layer Compression System - Bilateral Elevate legs to the level of the heart and pump ankles as often as possible Wound #7 Right,Lateral Malleolus: 3 Layer Compression System - Bilateral Elevate legs to the level of the heart and pump ankles as often as possible Off-Loading: Wound #1 Left,Dorsal Foot: Turn and reposition every 2 hours Other: - float heels above heart level while in the bed Wound #2 Right,Dorsal Foot: Turn and reposition every 2 hours Other: - float heels above heart level while in the bed Wound #3 Left Calcaneus: Turn and reposition every 2 hours Other: - float heels above heart level while in the bed Wound #4 Right Calcaneus: Turn and reposition every 2 hours Other: - float heels above heart level while in the bed Wound #5 Right,Anterior Lower Leg: Turn and reposition every 2 hours Other: - float heels above heart level while in the bed Wound #6 Right,Posterior Lower Leg: Turn and reposition every 2 hours Boutin, Yer R. ([id number]) Other: - float heels above heart level while in the bed Wound #7 Right,Lateral Malleolus: Turn and reposition every 2 hours Other: - float heels above heart level while in the bed Additional Orders / Instructions: Wound #1 Left,Dorsal Foot: Increase protein intake. Wound #2 Right,Dorsal Foot: Increase protein intake. Wound #3 Left Calcaneus: Increase protein intake. Wound #4 Right Calcaneus: Increase protein intake. Wound #5 Right,Anterior Lower Leg: Increase protein intake. Wound #6 Right,Posterior Lower Leg: Increase protein intake. Wound #7 Right,Lateral Malleolus: Increase protein  intake. Home Health: Wound #1 Left,Dorsal Foot: [place] Nurse may visit PRN to address patient s wound care needs. FACE TO FACE ENCOUNTER: MEDICARE and MEDICAID PATIENTS: I certify that this patient is under my care and that I had a face-to-face encounter that meets the physician face-to-face encounter requirements with this patient on this date. The encounter with the patient was in whole or in part for the following MEDICAL CONDITION: (primary reason for [place]) MEDICAL NECESSITY: I certify, that based on my findings, NURSING services are a medically necessary home health service. HOME BOUND STATUS: I certify that my clinical findings support that this patient is homebound (i.e., Due to illness or injury, pt requires aid of supportive devices such as crutches, cane, wheelchairs, walkers, the use of special transportation or the assistance of another person to leave their place of residence. There is a normal inability to leave the home and doing so requires considerable and taxing effort. Other absences are for medical reasons / religious services and are infrequent or of short duration when for other reasons). If current dressing causes regression in wound condition, may D/C ordered dressing product/s and apply Normal Saline Moist Dressing daily until next [place] / Other MD appointment. [place] of regression in wound condition at [phone]. Please direct any NON-WOUND related issues/requests for orders to patient's Primary Care Physician Wound #2 Right,Dorsal Foot: [place]  Nurse may visit PRN to address patient s wound care needs. FACE TO FACE ENCOUNTER: MEDICARE and MEDICAID PATIENTS: I certify that this patient is under my care and that I had a face-to-face encounter that meets the physician face-to-face encounter requirements with this patient on this date. The encounter with  the patient was in whole or in part for the following MEDICAL CONDITION: (primary reason for [place]) MEDICAL NECESSITY: I certify, that based on my findings, NURSING services are a medically necessary home health service. HOME BOUND STATUS: I certify that my clinical findings support that this patient is homebound (i.e., Due to illness or injury, pt requires aid of supportive devices such as crutches, cane, wheelchairs, walkers, the use of special transportation or the assistance of another person to leave their place of residence. There is a normal inability to leave the home and doing so requires considerable and taxing effort. Other absences are for medical reasons / religious services and are infrequent or of short duration when for other reasons). If current dressing causes regression in wound condition, may D/C ordered dressing product/s and apply MAKIYLA, MARCELLA R. ([id number]) Normal Saline Moist Dressing daily until next [place] / Other MD appointment. [place] of regression in wound condition at [phone]. Please direct any NON-WOUND related issues/requests for orders to patient's Primary Care Physician Wound #3 Left Calcaneus: [place] Nurse may visit PRN to address patient s wound care needs. FACE TO FACE ENCOUNTER: MEDICARE and MEDICAID PATIENTS: I certify that this patient is under my care and that I had a face-to-face encounter that meets the physician face-to-face encounter requirements with this patient on this date. The encounter with the patient was in whole or in part for the following MEDICAL CONDITION: (primary reason for [place]) MEDICAL NECESSITY: I certify, that based on my findings, NURSING services are a medically necessary home health service. HOME BOUND STATUS: I certify that my clinical findings support that this patient is homebound (i.e., Due to illness or injury, pt requires aid of  supportive devices such as crutches, cane, wheelchairs, walkers, the use of special transportation or the assistance of another person to leave their place of residence. There is a normal inability to leave the home and doing so requires considerable and taxing effort. Other absences are for medical reasons / religious services and are infrequent or of short duration when for other reasons). If current dressing causes regression in wound condition, may D/C ordered dressing product/s and apply Normal Saline Moist Dressing daily until next [place] / Other MD appointment. [place] of regression in wound condition at [phone]. Please direct any NON-WOUND related issues/requests for orders to patient's Primary Care Physician Wound #4 Right Calcaneus: [place] Nurse may visit PRN to address patient s wound care needs. FACE TO FACE ENCOUNTER: MEDICARE and MEDICAID PATIENTS: I certify that this patient is under my care and that I had a face-to-face encounter that meets the physician face-to-face encounter requirements with this patient on this date. The encounter with the patient was in whole or in part for the following MEDICAL CONDITION: (primary reason for [place]) MEDICAL NECESSITY: I certify, that based on my findings, NURSING services are a medically necessary home health service. HOME BOUND STATUS: I certify that my clinical findings support that this patient is homebound (i.e., Due to illness or injury, pt requires aid of supportive devices such as crutches, cane, wheelchairs, walkers,  the use of special transportation or the assistance of another person to leave their place of residence. There is a normal inability to leave the home and doing so requires considerable and taxing effort. Other absences are for medical reasons / religious services and are infrequent or of short duration when for other reasons). If current  dressing causes regression in wound condition, may D/C ordered dressing product/s and apply Normal Saline Moist Dressing daily until next [place] / Other MD appointment. [place] of regression in wound condition at [phone]. Please direct any NON-WOUND related issues/requests for orders to patient's Primary Care Physician Wound #5 Right,Anterior Lower Leg: [place] Nurse may visit PRN to address patient s wound care needs. FACE TO FACE ENCOUNTER: MEDICARE and MEDICAID PATIENTS: I certify that this patient is under my care and that I had a face-to-face encounter that meets the physician face-to-face encounter requirements with this patient on this date. The encounter with the patient was in whole or in part for the following MEDICAL CONDITION: (primary reason for [place]) MEDICAL NECESSITY: I certify, that based on my findings, NURSING services are a medically necessary home health service. HOME BOUND STATUS: I certify that my clinical findings support that this patient is homebound (i.e., Due to illness or injury, pt requires aid of supportive devices such as crutches, cane, wheelchairs, walkers, the use of special transportation or the assistance of another person to leave their place of residence. There is a normal inability to leave the home and doing so requires considerable and taxing effort. Other absences are for medical reasons / religious services and are infrequent or of short duration when for other reasons). If current dressing causes regression in wound condition, may D/C ordered dressing product/s and apply WEALTHA, DOMINGO R. ([id number]) Normal Saline Moist Dressing daily until next [place] / Other MD appointment. [place] of regression in wound condition at [phone]. Please direct any NON-WOUND related issues/requests for orders to patient's Primary Care Physician Wound #6  Right,Posterior Lower Leg: [place] Nurse may visit PRN to address patient s wound care needs. FACE TO FACE ENCOUNTER: MEDICARE and MEDICAID PATIENTS: I certify that this patient is under my care and that I had a face-to-face encounter that meets the physician face-to-face encounter requirements with this patient on this date. The encounter with the patient was in whole or in part for the following MEDICAL CONDITION: (primary reason for [place]) MEDICAL NECESSITY: I certify, that based on my findings, NURSING services are a medically necessary home health service. HOME BOUND STATUS: I certify that my clinical findings support that this patient is homebound (i.e., Due to illness or injury, pt requires aid of supportive devices such as crutches, cane, wheelchairs, walkers, the use of special transportation or the assistance of another person to leave their place of residence. There is a normal inability to leave the home and doing so requires considerable and taxing effort. Other absences are for medical reasons / religious services and are infrequent or of short duration when for other reasons). If current dressing causes regression in wound condition, may D/C ordered dressing product/s and apply Normal Saline Moist Dressing daily until next [place] / Other MD appointment. [place] of regression in wound condition at [phone]. Please direct any NON-WOUND related issues/requests for orders to patient's Primary Care Physician Wound #7 Right,Lateral Malleolus: [place] Nurse  may visit PRN to address patient s wound care needs. FACE TO FACE ENCOUNTER: MEDICARE and MEDICAID PATIENTS: I certify that this patient is under my care and that I had a face-to-face encounter that meets the physician face-to-face encounter requirements with this patient on this date. The encounter with the patient was in  whole or in part for the following MEDICAL CONDITION: (primary reason for [place]) MEDICAL NECESSITY: I certify, that based on my findings, NURSING services are a medically necessary home health service. HOME BOUND STATUS: I certify that my clinical findings support that this patient is homebound (i.e., Due to illness or injury, pt requires aid of supportive devices such as crutches, cane, wheelchairs, walkers, the use of special transportation or the assistance of another person to leave their place of residence. There is a normal inability to leave the home and doing so requires considerable and taxing effort. Other absences are for medical reasons / religious services and are infrequent or of short duration when for other reasons). If current dressing causes regression in wound condition, may D/C ordered dressing product/s and apply Normal Saline Moist Dressing daily until next [place] / Other MD appointment. [place] of regression in wound condition at [phone]. Please direct any NON-WOUND related issues/requests for orders to patient's Primary Care Physician Medications-please add to medication list.: Wound #1 Left,Dorsal Foot: Other: - Vitamin C, Zinc, MVI Wound #2 Right,Dorsal Foot: Other: - Vitamin C, Zinc, MVI Wound #3 Left Calcaneus: Other: - Vitamin C, Zinc, MVI Wound #4 Right Calcaneus: Other: - Vitamin C, Zinc, MVI Wound #5 Right,Anterior Lower Leg: Other: - Vitamin C, Zinc, MVI Wound #6 Right,Posterior Lower Leg: Other: - Vitamin C, Zinc, MVI Wound #7 Right,Lateral MalleolusTEAYA, BENZA R. ([id number]) Other: - Vitamin C, Zinc, MVI Radiology ordered were: MRI, lower extremity without contrast - bilateral feet, ankles #1 the primary dressing I had in mind was Santyl however I think this would take a copious amount of Santyl. Therefore changed to Iodoflex to the dorsal ankle wounds, Medihoney to the heels continuing Prisma to the  right lateral leg and right lateral upper leg. #2 the question of osteomyelitis in the left foot will need to be further explored with MRI without contrast if the patient can have an MRI given the prior rod in her left thigh. I cannot find any reference to this. #3 for a variety of reasons the patient is nonambulatory. Has to be transferred at home with a [facility] lift. If it turns out there is a serious infection in the left foot, an argument could be made for an amputation although I think we'll allow the MRI to dictate that discussion. I have prepared the family and the patient for this discussion awaiting of course the MRI result which hopefully we can obtain. #4 orders to home health Electronic Signature(s) Signed: 01/13/2017 12:48:24 PM By: Gretta Cool RN, BSN, Kim RN, BSN Signed: 01/26/2017 7:48:39 AM By: Linton Ham MD Previous Signature: 01/11/2017 5:54:02 PM Version By: Linton Ham MD Entered By: Gretta Cool RN, BSN, Kim on 01/13/2017 12:48:24 ADAN, DECKARD ([id number]) -------------------------------------------------------------------------------- [place] Details Patient Name: [name] Date of Service: 01/11/2017 Medical Record Patient Account Number: [id number] Number: Treating RN: Ahmed Prima [birth year] (83 y.o. 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 without L97.515 evidence of necrosis L89.613 Pressure ulcer of right heel, stage 3 L89.620 Pressure ulcer of left heel, unstageable 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 Facility Procedures CPT4: Description Modifier Quantity Code [id number] - DEB SUBQ TISSUE 20 SQ CM/< 1 ICD-10 Description Diagnosis E11.621 Type 2 diabetes mellitus with foot ulcer L97.525 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  without evidence of necrosis 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

## 2017-01-18 ENCOUNTER — Encounter: Payer: Medicare Other | Attending: Internal Medicine | Admitting: Internal Medicine

## 2017-01-18 DIAGNOSIS — G4730 Sleep apnea, unspecified: Secondary | ICD-10-CM | POA: Insufficient documentation

## 2017-01-18 DIAGNOSIS — I120 Hypertensive chronic kidney disease with stage 5 chronic kidney disease or end stage renal disease: Secondary | ICD-10-CM | POA: Insufficient documentation

## 2017-01-18 DIAGNOSIS — Z992 Dependence on renal dialysis: Secondary | ICD-10-CM | POA: Insufficient documentation

## 2017-01-18 DIAGNOSIS — E785 Hyperlipidemia, unspecified: Secondary | ICD-10-CM | POA: Diagnosis not present

## 2017-01-18 DIAGNOSIS — L89613 Pressure ulcer of right heel, stage 3: Secondary | ICD-10-CM | POA: Diagnosis not present

## 2017-01-18 DIAGNOSIS — E11621 Type 2 diabetes mellitus with foot ulcer: Secondary | ICD-10-CM | POA: Diagnosis present

## 2017-01-18 DIAGNOSIS — Z96653 Presence of artificial knee joint, bilateral: Secondary | ICD-10-CM | POA: Insufficient documentation

## 2017-01-18 DIAGNOSIS — E669 Obesity, unspecified: Secondary | ICD-10-CM | POA: Diagnosis not present

## 2017-01-18 DIAGNOSIS — K7460 Unspecified cirrhosis of liver: Secondary | ICD-10-CM | POA: Insufficient documentation

## 2017-01-18 DIAGNOSIS — E1122 Type 2 diabetes mellitus with diabetic chronic kidney disease: Secondary | ICD-10-CM | POA: Insufficient documentation

## 2017-01-18 DIAGNOSIS — M109 Gout, unspecified: Secondary | ICD-10-CM | POA: Diagnosis not present

## 2017-01-18 DIAGNOSIS — Z6838 Body mass index (BMI) 38.0-38.9, adult: Secondary | ICD-10-CM | POA: Insufficient documentation

## 2017-01-18 DIAGNOSIS — Z794 Long term (current) use of insulin: Secondary | ICD-10-CM | POA: Diagnosis not present

## 2017-01-18 DIAGNOSIS — L97515 Non-pressure chronic ulcer of other part of right foot with muscle involvement without evidence of necrosis: Secondary | ICD-10-CM | POA: Diagnosis not present

## 2017-01-18 DIAGNOSIS — Z880 Allergy status to penicillin: Secondary | ICD-10-CM | POA: Diagnosis not present

## 2017-01-18 DIAGNOSIS — L97213 Non-pressure chronic ulcer of right calf with necrosis of muscle: Secondary | ICD-10-CM | POA: Insufficient documentation

## 2017-01-18 DIAGNOSIS — L89620 Pressure ulcer of left heel, unstageable: Secondary | ICD-10-CM | POA: Diagnosis not present

## 2017-01-18 DIAGNOSIS — L97525 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 without evidence of necrosis: Secondary | ICD-10-CM | POA: Insufficient documentation

## 2017-01-18 DIAGNOSIS — M1990 Unspecified osteoarthritis, unspecified site: Secondary | ICD-10-CM | POA: Insufficient documentation

## 2017-01-18 DIAGNOSIS — N186 End stage renal disease: Secondary | ICD-10-CM | POA: Insufficient documentation

## 2017-01-18 DIAGNOSIS — Z85828 Personal history of other malignant neoplasm of skin: Secondary | ICD-10-CM | POA: Insufficient documentation

## 2017-01-18 DIAGNOSIS — I4891 Unspecified atrial fibrillation: Secondary | ICD-10-CM | POA: Insufficient documentation

## 2017-01-18 DIAGNOSIS — E1142 Type 2 diabetes mellitus with diabetic polyneuropathy: Secondary | ICD-10-CM | POA: Insufficient documentation

## 2017-01-20 NOTE — Progress Notes (Signed)
SHALAUNDA, SUBASIC ([id number]) Visit Report for 01/18/2017 Chief Complaint Document Details Patient Name: Connie, Tucker Date of Service: 01/18/2017 12:30 PM Medical Record Patient Account Number: [id number] Number: Treating RN: Ahmed Prima 31-Oct-1934 (83 y.o. Other Clinician: Date of Birth/Sex: Female) Treating Samanta Gal Primary Care Provider: Emily Filbert Provider/Extender: G Referring Provider: Melina Modena in Treatment: 2 Information Obtained from: Patient Chief Complaint 01/04/17; patient is here for review of wounds on her bilateral lower extremities referred from her primary physician Dr. Emily Filbert [facility] Electronic Signature(s) Signed: 01/19/2017 4:28:51 PM By: Linton Ham MD Entered By: Linton Ham on 01/18/2017 14:24:40 [name] ([id number]) -------------------------------------------------------------------------------- HPI Details Patient Name: [name] Date of Service: 01/18/2017 12:30 PM Medical Record Patient Account Number: [id number] Number: Treating RN: Ahmed Prima 1934/06/17 (83 y.o. Other Clinician: Date of Birth/Sex: Female) Treating Leeza Heiner Primary Care Provider: Emily Filbert Provider/Extender: G Referring Provider: Melina Modena in Treatment: 2 History of Present Illness HPI Description: 01/04/17; this is a 83 year old fairly disabled woman who comes accompanied by her husband today. She is here for review of wounds on her bilateral feet and right calf. There is a hopeful note from Dr. Sabra Heck that is included. The patient is a type II diabetic with end-stage renal failure on dialysis, she is here for bilateral wounds in her feet and heels as well as the right calf wound posteriorly. The history here is a bit difficult to follow. Her husband states that she had a fracture of her leg in October. Indeed looking [facility] she had a closed nondisplaced fracture of the  lateral condyle of the right femur. This was managed nonoperatively with a knee brace. Her husband thinks that the brace itself caused the wounds on the right leg although that would not of caused ones on the left. Apparently the wounds have been there for 2 months and the patient is being followed by [facility] with bilateral Unna boots. She is also followed in Dr. Ozella Almond office of vascular surgery. She had recent arterial studies that showed biphasic waveforms bilaterally with a ABI on the right of 1.01 on the left at 0.93. T ABIs on the right at 0.72 and on the left at 0.75. He was not felt to have significant arterial disease. I am also not clear what [facility] was primarily dressing the wounds with under the wraps the patient had an admission on 10/17 with an acute right leg DVT status post IVC filter placement and was placed on Eliquis for 6 weeks only. 01/11/17; the x-rays that I ordered last week on this patient have been completed the left is the problem area. She is had a previous distal tib-fib fracture which appears to be better than the last imaging view I can find in September. She appears to have osteomyelitis of the lateral aspect of the talus. Left foot showed bone resorption and loss of cortical Along the medial margin of the navicular and medial cuneiform consistent with on the osteomyelitis. rRght foot and ankle showed diffuse soft tissue swelling without obvious focal bone destruction there was a suggestion of a right fourth metatarsal fracture. boen I actually tried to get more history out of the patient and her husband who is present I've also spoken to her son on the phone. It would appear that the deep area on the lateral aspect of her right knee was abrasive injury also the area on the lateral aspect just above the ankle.  The she also probably consistent with pressure ulcers and dorsal ankle extensive wounds probably wrap injuries although I am guessing  about some of this. She does not have an arterial issue per Dr. dew. Her left tib-fib fracture was apparently in April as well she had a left femur fracture sometime in 2014 2015. She has a rod in her left femur hopefully we can get her through an MRI. I looked through [facility] back to 2009 I don't actually see an x-ray of the left femur. 01/18/17; her MRI is booked for February 13. We had some trouble with home health and the dressings that were prescribed so they left the current wrap on all week. There is increasing erythema on the medial aspect of the right foot compatible with cellulitis she will need an antibiotic Connie, Tucker ([id number]) Electronic Signature(s) Signed: 01/19/2017 4:28:51 PM By: Linton Ham MD Entered By: Linton Ham on 01/18/2017 14:26:25 [name] ([id number]) -------------------------------------------------------------------------------- Physical Exam Details Patient Name: [name] Date of Service: 01/18/2017 12:30 PM Medical Record Patient Account Number: [id number] Number: Treating RN: Ahmed Prima [birth year] (83 y.o. Other Clinician: Date of Birth/Sex: Female) Treating Harrison Paulson Primary Care Provider: Emily Filbert Provider/Extender: G Referring Provider: Melina Modena in Treatment: 2 Constitutional Patient is hypotensive.. Pulse regular and within target range for patient.Marland Kitchen Respirations regular, non-labored and within target range.. Temperature is normal and within the target range for the patient.. Patient looks chronically ill. Eyes Conjunctivae clear. No discharge.Marland Kitchen Respiratory Above normal respiratory effort noted. Respiratory rate elveaated. Shallow but no adventitious sounds. Cardiovascular Obvious elevation of the jugular venous pressure. Pedal pulses are palpable bilaterally. Lymphatic Nonpalpable popliteal or inguinal area. Integumentary (Hair, Skin) Inner lower legs there are both scattered  petechiae and intradermal hemorrhage in her bilateral lower legs although the skin looks somewhat better probably with edema control. Cause of this is not really clear. Psychiatric No evidence of depression, anxiety, or agitation. Calm, cooperative, and communicative. Appropriate interactions and affect.. Notes Wound exam; oThe large areas over her dorsal feet which I am assuming or wrap injuries at least initially. Still nonviable surface over most of this I did not attempt debridement again today. oThick black eschar over the left heel once again I would like to see the MRI before I attempt anything on this area. oDeep area over the right Achilles with some exposed tendon. This does not look too bad. oLarge area on the lateral aspect of the right knee area again with exposed tendon. Again the base of this wound does not look to unhealthy. Electronic Signature(s) Signed: 01/19/2017 4:28:51 PM By: Linton Ham MD Entered By: Linton Ham on 01/18/2017 14:38:56 [name] ([id number]) -------------------------------------------------------------------------------- Physician Orders Details Patient Name: [name] Date of Service: 01/18/2017 12:30 PM Medical Record Patient Account Number: [id number] Number: Treating RN: Cornell Barman March 12, 1934 (83 y.o. Other Clinician: Date of Birth/Sex: Female) Treating Zuleyma Scharf Primary Care Provider: Emily Filbert Provider/Extender: G Referring Provider: Melina Modena in Treatment: 2 Verbal / Phone Orders: No Diagnosis Coding Wound Cleansing Wound #1 Left,Dorsal Foot o Clean wound with Normal Saline. o Cleanse wound with mild soap and water Wound #2 Right,Dorsal Foot o Clean wound with Normal Saline. o Cleanse wound with mild soap and water Wound #3 Left Calcaneus o Clean wound with Normal Saline. o Cleanse wound with mild soap and water Wound #4 Right Calcaneus o Clean wound with Normal  Saline. o Cleanse wound with mild soap and water Wound #  5 Right,Anterior Lower Leg o Clean wound with Normal Saline. o Cleanse wound with mild soap and water Wound #6 Right,Posterior Lower Leg o Clean wound with Normal Saline. o Cleanse wound with mild soap and water Wound #7 Right,Lateral Malleolus o Clean wound with Normal Saline. o Cleanse wound with mild soap and water Anesthetic Wound #1 Left,Dorsal Foot o Topical Lidocaine 4% cream applied to wound bed prior to debridement - for clinic use only Wound #2 Right,Dorsal Foot o Topical Lidocaine 4% cream applied to wound bed prior to debridement - for clinic use only DAZIYA, CERECERES R. ([id number]) Wound #3 Left Calcaneus o Topical Lidocaine 4% cream applied to wound bed prior to debridement - for clinic use only Wound #4 Right Calcaneus o Topical Lidocaine 4% cream applied to wound bed prior to debridement - for clinic use only Wound #5 Right,Anterior Lower Leg o Topical Lidocaine 4% cream applied to wound bed prior to debridement - for clinic use only Wound #6 Right,Posterior Lower Leg o Topical Lidocaine 4% cream applied to wound bed prior to debridement - for clinic use only Wound #7 Right,Lateral Malleolus o Topical Lidocaine 4% cream applied to wound bed prior to debridement - for clinic use only Primary Wound Dressing Wound #1 Left,Dorsal Foot o Iodoflex Wound #2 Right,Dorsal Foot o Iodoflex Wound #3 Left Calcaneus o Medihoney gel Wound #4 Right Calcaneus o Medihoney gel Wound #5 Right,Anterior Lower Leg o Prisma Ag Wound #6 Right,Posterior Lower Leg o Prisma Ag Wound #7 Right,Lateral Malleolus o Prisma Ag Secondary Dressing Wound #1 Left,Dorsal Foot o ABD pad o Dry Gauze o Drawtex Wound #2 Right,Dorsal Foot o ABD pad o Dry Gauze o Drawtex [place], Connie R. ([id number]) Wound #3 Left Calcaneus o ABD pad o Dry Gauze Wound #4 Right Calcaneus o ABD  pad o Dry Gauze Wound #5 Right,Anterior Lower Leg o ABD pad o Dry Gauze Wound #6 Right,Posterior Lower Leg o ABD pad o Dry Gauze o Drawtex Wound #7 Right,Lateral Malleolus o ABD pad o Dry Gauze Dressing Change Frequency Wound #1 Left,Dorsal Foot o Three times weekly - HHRN change wraps on Thursdays and pt will have this changed on Tuesdays in the [facility] Wound #2 Right,Dorsal Foot o Three times weekly - HHRN change wraps on Thursdays and pt will have this changed on Tuesdays in the [facility] Wound #3 Left Calcaneus o Three times weekly - HHRN change wraps on Thursdays and pt will have this changed on Tuesdays in the [facility] Wound #4 Right Calcaneus o Three times weekly - HHRN change wraps on Thursdays and pt will have this changed on Tuesdays in the [facility] Wound #5 Right,Anterior Lower Leg o Three times weekly - HHRN change wraps on Thursdays and pt will have this changed on Tuesdays in the [facility] Wound #6 Right,Posterior Lower Leg o Three times weekly - HHRN change wraps on Thursdays and pt will have this changed on Tuesdays in the [facility] Wound #7 Right,Lateral Malleolus Connie, PONCEDELEON R. ([id number]) o Three times weekly - HHRN change wraps on Thursdays and pt will have this changed on Tuesdays in the [facility] Follow-up Appointments Wound #1 Left,Dorsal Foot o Return Appointment in 1 week. Wound #2 Right,Dorsal Foot o Return Appointment in 1 week. Wound #3 Left Calcaneus o Return Appointment in 1 week. Wound #4 Right Calcaneus o Return Appointment in 1 week. Wound #5 Right,Anterior Lower Leg o Return Appointment in 1 week. Wound #6 Right,Posterior  Lower Leg o Return Appointment in 1 week. Edema Control Wound #1 Left,Dorsal Foot o 3 Layer Compression System - Bilateral o Elevate legs to the level of the heart and pump ankles as often as possible Wound #2  Right,Dorsal Foot o 3 Layer Compression System - Bilateral o Elevate legs to the level of the heart and pump ankles as often as possible Wound #3 Left Calcaneus o 3 Layer Compression System - Bilateral o Elevate legs to the level of the heart and pump ankles as often as possible Wound #4 Right Calcaneus o 3 Layer Compression System - Bilateral o Elevate legs to the level of the heart and pump ankles as often as possible Wound #5 Right,Anterior Lower Leg o 3 Layer Compression System - Bilateral o Elevate legs to the level of the heart and pump ankles as often as possible Wound #6 Right,Posterior Lower Leg o 3 Layer Compression System - Bilateral o Elevate legs to the level of the heart and pump ankles as often as possible Wound #7 Right,Lateral Malleolus o 3 Layer Compression System - Bilateral Codispoti, Hendrix R. ([id number]) o Elevate legs to the level of the heart and pump ankles as often as possible Off-Loading Wound #1 Left,Dorsal Foot o Turn and reposition every 2 hours o Other: - float heels above heart level while in the bed Wound #2 Right,Dorsal Foot o Turn and reposition every 2 hours o Other: - float heels above heart level while in the bed Wound #3 Left Calcaneus o Turn and reposition every 2 hours o Other: - float heels above heart level while in the bed Wound #4 Right Calcaneus o Turn and reposition every 2 hours o Other: - float heels above heart level while in the bed Wound #5 Right,Anterior Lower Leg o Turn and reposition every 2 hours o Other: - float heels above heart level while in the bed Wound #6 Right,Posterior Lower Leg o Turn and reposition every 2 hours o Other: - float heels above heart level while in the bed Wound #7 Right,Lateral Malleolus o Turn and reposition every 2 hours o Other: - float heels above heart level while in the bed Additional Orders / Instructions Wound #1 Left,Dorsal Foot o  Increase protein intake. Wound #2 Right,Dorsal Foot o Increase protein intake. Wound #3 Left Calcaneus o Increase protein intake. Wound #4 Right Calcaneus o Increase protein intake. Wound #5 Right,Anterior Lower Leg o Increase protein intake. Wound #6 Right,Posterior Lower Leg [name], Connie R. ([id number]) o Increase protein intake. Wound #7 Right,Lateral Malleolus o Increase protein intake. Home Health Wound #1 [place] Visits o Home Health Nurse may visit PRN to address patientos wound care needs. o FACE TO FACE ENCOUNTER: MEDICARE and MEDICAID PATIENTS: I certify that this patient is under my care and that I had a face-to-face encounter that meets the physician face-to-face encounter requirements with this patient on this date. The encounter with the patient was in whole or in part for the following MEDICAL CONDITION: (primary reason for [place]) MEDICAL NECESSITY: I certify, that based on my findings, NURSING services are a medically necessary home health service. HOME BOUND STATUS: I certify that my clinical findings support that this patient is homebound (i.e., Due to illness or injury, pt requires aid of supportive devices such as crutches, cane, wheelchairs, walkers, the use of special transportation or the assistance of another person to leave their place of residence. There is a normal inability to leave the home and doing so  requires considerable and taxing effort. Other absences are for medical reasons / religious services and are infrequent or of short duration when for other reasons). o If current dressing causes regression in wound condition, may D/C ordered dressing product/s and apply Normal Saline Moist Dressing daily until next [place] / Other MD appointment. [place] of regression in wound condition at [phone]. o Please direct any NON-WOUND related issues/requests for  orders to patient's Primary Care Physician Wound #2 [place] Nurse may visit PRN to address patientos wound care needs. o FACE TO FACE ENCOUNTER: MEDICARE and MEDICAID PATIENTS: I certify that this patient is under my care and that I had a face-to-face encounter that meets the physician face-to-face encounter requirements with this patient on this date. The encounter with the patient was in whole or in part for the following MEDICAL CONDITION: (primary reason for [place]) MEDICAL NECESSITY: I certify, that based on my findings, NURSING services are a medically necessary home health service. HOME BOUND STATUS: I certify that my clinical findings support that this patient is homebound (i.e., Due to illness or injury, pt requires aid of supportive devices such as crutches, cane, wheelchairs, walkers, the use of special transportation or the assistance of another person to leave their place of residence. There is a normal inability to leave the home and doing so requires considerable and taxing effort. Other absences are for medical reasons / religious services and are infrequent or of short duration when for other reasons). o If current dressing causes regression in wound condition, may D/C ordered dressing product/s and apply Normal Saline Moist Dressing daily until next [place] / Other MD appointment. [place] of regression in wound condition at [phone]. o Please direct any NON-WOUND related issues/requests for orders to patient's Primary Care Physician Wound #3 Left [place] Visits [name], [name] ([id number] Nurse may visit PRN to address patientos wound care needs. o FACE TO FACE ENCOUNTER: MEDICARE and MEDICAID PATIENTS: I certify that this patient is under my care and that I had a face-to-face encounter that meets the physician  face-to-face encounter requirements with this patient on this date. The encounter with the patient was in whole or in part for the following MEDICAL CONDITION: (primary reason for [place]) MEDICAL NECESSITY: I certify, that based on my findings, NURSING services are a medically necessary home health service. HOME BOUND STATUS: I certify that my clinical findings support that this patient is homebound (i.e., Due to illness or injury, pt requires aid of supportive devices such as crutches, cane, wheelchairs, walkers, the use of special transportation or the assistance of another person to leave their place of residence. There is a normal inability to leave the home and doing so requires considerable and taxing effort. Other absences are for medical reasons / religious services and are infrequent or of short duration when for other reasons). o If current dressing causes regression in wound condition, may D/C ordered dressing product/s and apply Normal Saline Moist Dressing daily until next [place] / Other MD appointment. [place] of regression in wound condition at [phone]. o Please direct any NON-WOUND related issues/requests for orders to patient's Primary Care Physician Wound #4 Right [place] Nurse may visit PRN to address patientos wound care needs. o FACE TO FACE ENCOUNTER: MEDICARE and MEDICAID PATIENTS: I  certify that this patient is under my care and that I had a face-to-face encounter that meets the physician face-to-face encounter requirements with this patient on this date. The encounter with the patient was in whole or in part for the following MEDICAL CONDITION: (primary reason for [place]) MEDICAL NECESSITY: I certify, that based on my findings, NURSING services are a medically necessary home health service. HOME BOUND STATUS: I certify that my clinical findings support  that this patient is homebound (i.e., Due to illness or injury, pt requires aid of supportive devices such as crutches, cane, wheelchairs, walkers, the use of special transportation or the assistance of another person to leave their place of residence. There is a normal inability to leave the home and doing so requires considerable and taxing effort. Other absences are for medical reasons / religious services and are infrequent or of short duration when for other reasons). o If current dressing causes regression in wound condition, may D/C ordered dressing product/s and apply Normal Saline Moist Dressing daily until next [place] / Other MD appointment. [place] of regression in wound condition at [phone]. o Please direct any NON-WOUND related issues/requests for orders to patient's Primary Care Physician Wound #5 [place] Nurse may visit PRN to address patientos wound care needs. o FACE TO FACE ENCOUNTER: MEDICARE and MEDICAID PATIENTS: I certify that this patient is under my care and that I had a face-to-face encounter that meets the physician face-to-face encounter requirements with this patient on this date. The encounter with the patient was in whole or in part for the following MEDICAL CONDITION: (primary reason for [place]) MEDICAL NECESSITY: I certify, that based on my findings, NURSING services are a medically necessary home health service. HOME BOUND STATUS: I certify that my clinical findings support that this patient is homebound (i.e., Due to illness or injury, pt requires aid of DARLANE, TRIPLET. ([id number]) supportive devices such as crutches, cane, wheelchairs, walkers, the use of special transportation or the assistance of another person to leave their place of residence. There is a normal inability to leave the home and doing so requires considerable and taxing  effort. Other absences are for medical reasons / religious services and are infrequent or of short duration when for other reasons). o If current dressing causes regression in wound condition, may D/C ordered dressing product/s and apply Normal Saline Moist Dressing daily until next [place] / Other MD appointment. [place] of regression in wound condition at [phone]. o Please direct any NON-WOUND related issues/requests for orders to patient's Primary Care Physician Wound #6 [place] Nurse may visit PRN to address patientos wound care needs. o FACE TO FACE ENCOUNTER: MEDICARE and MEDICAID PATIENTS: I certify that this patient is under my care and that I had a face-to-face encounter that meets the physician face-to-face encounter requirements with this patient on this date. The encounter with the patient was in whole or in part for the following MEDICAL CONDITION: (primary reason for [place]) MEDICAL NECESSITY: I certify, that based on my findings, NURSING services are a medically necessary home health service. HOME BOUND STATUS: I certify that my clinical findings support that this patient is homebound (i.e., Due to illness or injury, pt requires aid of supportive devices such as crutches, cane, wheelchairs, walkers, the use of special transportation or the assistance of another  person to leave their place of residence. There is a normal inability to leave the home and doing so requires considerable and taxing effort. Other absences are for medical reasons / religious services and are infrequent or of short duration when for other reasons). o If current dressing causes regression in wound condition, may D/C ordered dressing product/s and apply Normal Saline Moist Dressing daily until next [place] / Other MD appointment. [place] of regression  in wound condition at [phone]. o Please direct any NON-WOUND related issues/requests for orders to patient's Primary Care Physician Wound #7 [place] Nurse may visit PRN to address patientos wound care needs. o FACE TO FACE ENCOUNTER: MEDICARE and MEDICAID PATIENTS: I certify that this patient is under my care and that I had a face-to-face encounter that meets the physician face-to-face encounter requirements with this patient on this date. The encounter with the patient was in whole or in part for the following MEDICAL CONDITION: (primary reason for [place]) MEDICAL NECESSITY: I certify, that based on my findings, NURSING services are a medically necessary home health service. HOME BOUND STATUS: I certify that my clinical findings support that this patient is homebound (i.e., Due to illness or injury, pt requires aid of supportive devices such as crutches, cane, wheelchairs, walkers, the use of special transportation or the assistance of another person to leave their place of residence. There is a normal inability to leave the home and doing so requires considerable and taxing effort. Other absences are for medical reasons / religious services and are infrequent or of short duration when for other reasons). o If current dressing causes regression in wound condition, may D/C ordered dressing product/s and apply Normal Saline Moist Dressing daily until next [place] / Other MD appointment. [place] of regression in wound condition at [phone]. [name], [name] ([id number]) o Please direct any NON-WOUND related issues/requests for orders to patient's Primary Care Physician Medications-please add to medication list. Wound #1 Left,Dorsal Foot o Other: - Vitamin C, Zinc, MVI Wound #2 Right,Dorsal Foot o Other: - Vitamin C, Zinc, MVI Wound #3 Left Calcaneus o Other: -  Vitamin C, Zinc, MVI Wound #4 Right Calcaneus o Other: - Vitamin C, Zinc, MVI Wound #5 Right,Anterior Lower Leg o Other: - Vitamin C, Zinc, MVI Wound #6 Right,Posterior Lower Leg o Other: - Vitamin C, Zinc, MVI Wound #7 Right,Lateral Malleolus o Other: - Vitamin C, Zinc, MVI Patient Medications Allergies: penicillin Notifications Medication Indication Start End doxycycline monohydrate 01/18/2017 DOSE bid - oral 100 mg capsule - bid capsule oral Electronic Signature(s) Signed: 01/18/2017 2:42:13 PM By: Linton Ham MD Entered By: Linton Ham on 01/18/2017 14:42:12 [name] ([id number]) -------------------------------------------------------------------------------- Prescription 01/18/2017 Patient Name: [name] Rx Reference #: Date of Birth: 1934-03-15 Provider: Ricard Dillon MD Sex: F NPI#: [id number] Phone #: [phone] DEA#: [id number] License #: [id number] Patient Address: [address] [place], [place] [address] [facility] [address] [place], [place] [address] [phone] Allergies penicillin Medication Medication: Route: Strength: Form: doxycycline monohydrate oral 100 mg capsule Class: TETRACYCLINES Dose: Frequency / Time: Indication: bid bid capsule oral Number of Refills: Number of Units: 0 Twenty (20) Capsule(s) Generic Substitution: Start Date: End Date: Administered at Substitution Permitted [id number] Facility: No Note to Pharmacy: Signature(s): Date(s): [name], [name] ([id number]) Electronic Signature(s) Signed: 01/19/2017 4:28:51 PM By: Linton Ham MD  Entered By: Linton Ham on 01/18/2017 14:42:13 [name] ([id number]) --------------------------------------------------------------------------------  Problem List Details Patient Name: [name] Date of Service: 01/18/2017 12:30 PM Medical Record Patient Account Number:  [id number] Number: Treating RN: Ahmed Prima 1934-07-25 (83 y.o. 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/2017 Yes muscle involvement without evidence of necrosis L97.515 Non-pressure chronic ulcer of other part of right foot with 01/04/2017 Yes muscle involvement without evidence of necrosis L89.613 Pressure ulcer of right heel, stage 3 01/04/2017 Yes L89.620 Pressure ulcer of left heel, unstageable 01/04/2017 Yes L97.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 Other Clinician: Date of Birth/Sex: Female) Treating Emanuelle Hammerstrom Primary Care Provider: Emily Filbert Provider/Extender: G Referring Provider: Melina Modena in Treatment: 2 Subjective Chief Complaint Information obtained from Patient 01/04/17; patient is here for review of wounds on her bilateral lower extremities referred from her primary physician Dr. Emily Filbert [facility] History of Present Illness (HPI) 01/04/17; this is a  83 year old fairly disabled woman who comes accompanied by her husband today. She is here for review of wounds on her bilateral feet and right calf. There is a hopeful note from Dr. Sabra Heck that is included. The patient is a type II diabetic with end-stage renal failure on dialysis, she is here for bilateral wounds in her feet and heels as well as the right calf wound posteriorly. The history here is a bit difficult to follow. Her husband states that she had a fracture of her leg in October. Indeed looking through [facility] she had a closed nondisplaced fracture of the lateral condyle of the right femur. This was managed nonoperatively with a knee brace. Her husband thinks that the brace itself caused the wounds on the right leg although that would not of caused ones on the left. Apparently the wounds have been there for 2 months and the patient is being followed by [facility] with bilateral Unna boots. She is also followed in Dr. Ozella Almond office of vascular surgery. She had recent arterial studies that showed biphasic waveforms bilaterally with a ABI on the right of 1.01 on the left at 0.93. T ABIs on the right at 0.72 and on the left at 0.75. He was not felt to have significant arterial disease. I am also not clear what [facility] was primarily dressing the wounds with under the wraps the patient had an admission on 10/17 with an acute right leg DVT status post IVC filter placement and was placed on Eliquis for 6 weeks only. 01/11/17; the x-rays that I ordered last week on this patient have been completed the left is the problem area. She is had a previous distal tib-fib fracture which appears to be better than the last imaging view I can find in September. She appears to  have osteomyelitis of the lateral aspect of the talus. Left foot showed bone resorption and loss of cortical Along the medial margin of the navicular and medial cuneiform consistent with on the osteomyelitis.  rRght foot and ankle showed diffuse soft tissue swelling without obvious focal bone destruction there was a suggestion of a right fourth metatarsal fracture. boen I actually tried to get more history out of the patient and her husband who is present I've also spoken to her son on the phone. It would appear that the deep area on the lateral aspect of her right knee was abrasive injury also the area on the lateral aspect just above the ankle. The she also probably consistent with pressure ulcers and dorsal ankle extensive wounds probably wrap injuries although I am guessing about some of this. She does not have an arterial issue per Dr. dew. Her left tib-fib fracture was apparently in April Millett, Galateo ([id number]) as well she had a left femur fracture sometime in 2014 2015. She has a rod in her left femur hopefully we can get her through an MRI. I looked through [facility] back to 2009 I don't actually see an x-ray of the left femur. 01/18/17; her MRI is booked for February 13. We had some trouble with home health and the dressings that were prescribed so they left the current wrap on all week. There is increasing erythema on the medial aspect of the right foot compatible with cellulitis she will need an antibiotic Objective Constitutional Patient is hypotensive.. Pulse regular and within target range for patient.Marland Kitchen Respirations regular, non-labored and within target range.. Temperature is normal and within the target range for the patient.. Patient looks chronically ill. Vitals Time Taken: 1:05 PM, Height: 61 in, Weight: 206 lbs, BMI: 38.9, Temperature: 97.9 F, Pulse: 96 bpm, Respiratory Rate: 16 breaths/min, Blood Pressure: 97/85 mmHg. Eyes Conjunctivae clear. No discharge.Marland Kitchen Respiratory Above normal respiratory effort noted. Respiratory rate elveaated. Shallow but no adventitious sounds. Cardiovascular Obvious elevation of the jugular venous pressure. Pedal pulses are palpable  bilaterally. Lymphatic Nonpalpable popliteal or inguinal area. Psychiatric No evidence of depression, anxiety, or agitation. Calm, cooperative, and communicative. Appropriate interactions and affect.. General Notes: Wound exam; The large areas over her dorsal feet which I am assuming or wrap injuries at least initially. Still nonviable surface over most of this I did not attempt debridement again today. Thick black eschar over the left heel once again I would like to see the MRI before I attempt anything on this area. Deep area over the right Achilles with some exposed tendon. This does not look too bad. Large area on the lateral aspect of the right knee area again with exposed tendon. Again the base of this wound does not look to unhealthy. Integumentary (Hair, Skin) Inner lower legs there are both scattered petechiae and intradermal hemorrhage in her bilateral lower legs [name], Annalucia R. ([id number]) although the skin looks somewhat better probably with edema control. Cause of this is not really clear. Wound #1 status is Open. Original cause of wound was Gradually Appeared. The wound is located on the Left,Dorsal Foot. The wound measures 4cm length x 5.5cm width x 0.6cm depth; 17.279cm^2 area and 10.367cm^3 volume. There is Fat Layer (Subcutaneous Tissue) Exposed exposed. There is no tunneling or undermining noted. There is a medium amount of serous drainage noted. The wound margin is well defined and not attached to the wound base. There is no granulation within the wound bed. There is a large (67- 100%)  amount of necrotic tissue within the wound bed including Eschar and Adherent Slough. The periwound skin appearance exhibited: Callus, Crepitus, Excoriation, Induration, Rash, Scarring, Dry/Scaly, Maceration, Atrophie Blanche, Cyanosis, Ecchymosis, Hemosiderin Staining, Mottled, Pallor, Rubor, Erythema. The surrounding wound skin color is noted with erythema. Wound #2 status is Open. Original  cause of wound was Gradually Appeared. The wound is located on the Right,Dorsal Foot. The wound measures 6cm length x 3.2cm width x 0.5cm depth; 15.08cm^2 area and 7.54cm^3 volume. There is Fat Layer (Subcutaneous Tissue) Exposed exposed. There is no tunneling or undermining noted. There is a large amount of purulent drainage noted. The wound margin is distinct with the outline attached to the wound base. There is small (1-33%) pink granulation within the wound bed. There is a large (67-100%) amount of necrotic tissue within the wound bed including Eschar and Adherent Slough. The periwound skin appearance exhibited: Maceration, Erythema. The surrounding wound skin color is noted with erythema which is circumferential. Periwound temperature was noted as No Abnormality. The periwound has tenderness on palpation. Wound #3 status is Open. Original cause of wound was Pressure Injury. The wound is located on the Left Calcaneus. The wound measures 3.5cm length x 8cm width x 0.5cm depth; 21.991cm^2 area and 10.996cm^3 volume. There is no tunneling or undermining noted. There is a large amount of serous drainage noted. The wound margin is distinct with the outline attached to the wound base. There is no granulation within the wound bed. There is a large (67-100%) amount of necrotic tissue within the wound bed including Eschar. The periwound skin appearance exhibited: Erythema. The surrounding wound skin color is noted with erythema which is circumferential. Periwound temperature was noted as No Abnormality. The periwound has tenderness on palpation. Wound #4 status is Open. Original cause of wound was Pressure Injury. The wound is located on the Right Calcaneus. The wound measures 6.5cm length x 2.5cm width x 0.6cm depth; 12.763cm^2 area and 7.658cm^3 volume. There is no tunneling or undermining noted. There is a large amount of purulent drainage noted. The wound margin is distinct with the outline  attached to the wound base. There is no granulation within the wound bed. There is a large (67-100%) amount of necrotic tissue within the wound bed including Eschar and Adherent Slough. The periwound skin appearance exhibited: Erythema. The surrounding wound skin color is noted with erythema which is circumferential. Periwound temperature was noted as No Abnormality. The periwound has tenderness on palpation. Wound #5 status is Open. Original cause of wound was Gradually Appeared. The wound is located on the Right,Anterior Lower Leg. The wound measures 0.9cm length x 0.8cm width x 0.3cm depth; 0.565cm^2 area and 0.17cm^3 volume. There is Fat Layer (Subcutaneous Tissue) Exposed exposed. There is no tunneling or undermining noted. There is a large amount of serosanguineous drainage noted. The wound margin is distinct with the outline attached to the wound base. There is large (67-100%) red granulation within the wound bed. There is a small (1-33%) amount of necrotic tissue within the wound bed including Eschar. Periwound temperature was noted as No Abnormality. The periwound has tenderness on palpation. Wound #6 status is Open. Original cause of wound was Gradually Appeared. The wound is located on the Right,Posterior Lower Leg. The wound measures 5.4cm length x 2cm width x 1.8cm depth; 8.482cm^2 area and 15.268cm^3 volume. There is no tunneling or undermining noted. There is a large amount of purulent [name], Connie R. ([id number]) drainage noted. The wound margin is distinct with the outline attached to  the wound base. There is no granulation within the wound bed. There is a large (67-100%) amount of necrotic tissue within the wound bed including Adherent Slough. The periwound skin appearance exhibited: Maceration, Erythema. The surrounding wound skin color is noted with erythema which is circumferential. Periwound temperature was noted as No Abnormality. The periwound has tenderness on  palpation. Wound #7 status is Open. Original cause of wound was Gradually Appeared. The wound is located on the Right,Lateral Malleolus. The wound measures 0.8cm length x 0.9cm width x 0.3cm depth; 0.565cm^2 area and 0.17cm^3 volume. Wound #8 status is Open. Original cause of wound was Gradually Appeared. The wound is located on the Right,Lateral,Posterior Lower Leg. The wound measures 0.3cm length x 0.8cm width x 0.1cm depth; 0.188cm^2 area and 0.019cm^3 volume. The wound is limited to skin breakdown. There is no tunneling or undermining noted. There is a small amount of serosanguineous drainage noted. The wound margin is distinct with the outline attached to the wound base. There is small (1-33%) pink granulation within the wound bed. There is a medium (34-66%) amount of necrotic tissue within the wound bed including Adherent Slough. 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 There was a Three Layer Compression Therapy Procedure by Cornell Barman, RN. Post procedure Diagnosis Wound #4: Same as Pre-Procedure [name], Connie R. ([id number]) Plan Wound Cleansing: Wound #1 Left,Dorsal Foot: Clean wound with Normal Saline. Cleanse wound with mild soap and water Wound #2 Right,Dorsal Foot: Clean wound with Normal Saline. Cleanse wound with mild soap and water Wound #3 Left Calcaneus: Clean wound with Normal  Saline. Cleanse wound with mild soap and water Wound #4 Right Calcaneus: Clean wound with Normal Saline. Cleanse wound with mild soap and water Wound #5 Right,Anterior Lower Leg: Clean wound with Normal Saline. Cleanse wound with mild soap and water Wound #6 Right,Posterior Lower Leg: Clean wound with Normal Saline. Cleanse wound with mild soap and water Wound #7 Right,Lateral Malleolus: Clean wound with Normal Saline. Cleanse wound with mild soap and water Anesthetic: Wound #1 Left,Dorsal Foot: Topical Lidocaine 4% cream applied to wound bed prior to debridement - for clinic use only Wound #2 Right,Dorsal Foot: Topical Lidocaine 4% cream applied to wound bed prior to debridement - for clinic use only Wound #3 Left Calcaneus: Topical Lidocaine 4% cream applied to wound bed prior to debridement - for clinic use only Wound #4 Right Calcaneus: Topical Lidocaine 4% cream applied to wound bed prior to debridement - for clinic use only Wound #5 Right,Anterior Lower Leg: Topical Lidocaine 4% cream applied to wound bed prior to debridement - for clinic use only Wound #6 Right,Posterior Lower Leg: Topical Lidocaine 4% cream applied to wound bed prior to debridement - for clinic use only Wound #7 Right,Lateral Malleolus: Topical Lidocaine 4% cream applied to wound bed prior to debridement - for clinic use only Primary Wound Dressing: Wound #1 Left,Dorsal Foot: Iodoflex Wound #2 Right,Dorsal Foot: Iodoflex Wound #3 Left Calcaneus: Medihoney  gel Wound #4 Right Calcaneus: BREAHNA, LAMOREAUX R. ([id number]) Medihoney gel Wound #5 Right,Anterior Lower Leg: Prisma Ag Wound #6 Right,Posterior Lower Leg: Prisma Ag Wound #7 Right,Lateral Malleolus: Prisma Ag Secondary Dressing: Wound #1 Left,Dorsal Foot: ABD pad Dry Gauze Drawtex Wound #2 Right,Dorsal Foot: ABD pad Dry Gauze Drawtex Wound #3 Left Calcaneus: ABD pad Dry Gauze Wound #4 Right Calcaneus: ABD pad Dry Gauze Wound #5  Right,Anterior Lower Leg: ABD pad Dry Gauze Wound #6 Right,Posterior Lower Leg: ABD pad Dry Gauze Drawtex Wound #7 Right,Lateral Malleolus: ABD pad Dry Gauze Dressing Change Frequency: Wound #1 Left,Dorsal Foot: Three times weekly - HHRN change wraps on Thursdays and pt will have this changed on Tuesdays in the [facility] Wound #2 Right,Dorsal Foot: Three times weekly - HHRN change wraps on Thursdays and pt will have this changed on Tuesdays in the [facility] Wound #3 Left Calcaneus: Three times weekly - HHRN change wraps on Thursdays and pt will have this changed on Tuesdays in the [facility] Wound #4 Right Calcaneus: Three times weekly - HHRN change wraps on Thursdays and pt will have this changed on Tuesdays in the [facility] Wound #5 Right,Anterior Lower Leg: Three times weekly - HHRN change wraps on Thursdays and pt will have this changed on Tuesdays in the [facility] Wound #6 Right,Posterior Lower Leg: Three times weekly - HHRN change wraps on Thursdays and pt will have this changed on Tuesdays in the [place] ([id number]) Wound #7 Right,Lateral Malleolus: Three times weekly - HHRN change wraps on Thursdays and pt will have this changed on Tuesdays in the [facility] Follow-up Appointments: Wound #1 Left,Dorsal Foot: Return Appointment in 1 week. Wound #2 Right,Dorsal Foot: Return Appointment in 1 week. Wound #3 Left Calcaneus: Return Appointment in 1 week. Wound #4 Right Calcaneus: Return Appointment in 1 week. Wound #5 Right,Anterior Lower Leg: Return Appointment in 1 week. Wound #6 Right,Posterior Lower Leg: Return Appointment in 1 week. Edema Control: Wound #1 Left,Dorsal Foot: 3 Layer Compression System - Bilateral Elevate legs to the level of the heart and pump ankles as often as possible Wound #2 Right,Dorsal Foot: 3 Layer Compression System - Bilateral Elevate legs to the level of the  heart and pump ankles as often as possible Wound #3 Left Calcaneus: 3 Layer Compression System - Bilateral Elevate legs to the level of the heart and pump ankles as often as possible Wound #4 Right Calcaneus: 3 Layer Compression System - Bilateral Elevate legs to the level of the heart and pump ankles as often as possible Wound #5 Right,Anterior Lower Leg: 3 Layer Compression System - Bilateral Elevate legs to the level of the heart and pump ankles as often as possible Wound #6 Right,Posterior Lower Leg: 3 Layer Compression System - Bilateral Elevate legs to the level of the heart and pump ankles as often as possible Wound #7 Right,Lateral Malleolus: 3 Layer Compression System - Bilateral Elevate legs to the level of the heart and pump ankles as often as possible Off-Loading: Wound #1 Left,Dorsal Foot: Turn and reposition every 2 hours Other: - float heels above heart level while in the bed Wound #2 Right,Dorsal Foot: Turn and reposition every 2 hours Other: - float heels above heart level while in the bed Wound #3 Left Calcaneus: Turn and reposition every 2 hours Other: - float heels above heart level while in the bed Wound #4 Right Calcaneus: Turn and reposition every 2 hours Other: -  float heels above heart level while in the bed Farson, Arionna R. ([id number]) Wound #5 Right,Anterior Lower Leg: Turn and reposition every 2 hours Other: - float heels above heart level while in the bed Wound #6 Right,Posterior Lower Leg: Turn and reposition every 2 hours Other: - float heels above heart level while in the bed Wound #7 Right,Lateral Malleolus: Turn and reposition every 2 hours Other: - float heels above heart level while in the bed Additional Orders / Instructions: Wound #1 Left,Dorsal Foot: Increase protein intake. Wound #2 Right,Dorsal Foot: Increase protein intake. Wound #3 Left Calcaneus: Increase protein intake. Wound #4 Right Calcaneus: Increase protein intake. Wound  #5 Right,Anterior Lower Leg: Increase protein intake. Wound #6 Right,Posterior Lower Leg: Increase protein intake. Wound #7 Right,Lateral Malleolus: Increase protein intake. Home Health: Wound #1 Left,Dorsal Foot: [place] Nurse may visit PRN to address patient s wound care needs. FACE TO FACE ENCOUNTER: MEDICARE and MEDICAID PATIENTS: I certify that this patient is under my care and that I had a face-to-face encounter that meets the physician face-to-face encounter requirements with this patient on this date. The encounter with the patient was in whole or in part for the following MEDICAL CONDITION: (primary reason for [place]) MEDICAL NECESSITY: I certify, that based on my findings, NURSING services are a medically necessary home health service. HOME BOUND STATUS: I certify that my clinical findings support that this patient is homebound (i.e., Due to illness or injury, pt requires aid of supportive devices such as crutches, cane, wheelchairs, walkers, the use of special transportation or the assistance of another person to leave their place of residence. There is a normal inability to leave the home and doing so requires considerable and taxing effort. Other absences are for medical reasons / religious services and are infrequent or of short duration when for other reasons). If current dressing causes regression in wound condition, may D/C ordered dressing product/s and apply Normal Saline Moist Dressing daily until next [place] / Other MD appointment. [place] of regression in wound condition at [phone]. Please direct any NON-WOUND related issues/requests for orders to patient's Primary Care Physician Wound #2 Right,Dorsal Foot: [place] Nurse may visit PRN to address patient s wound care needs. FACE TO FACE ENCOUNTER: MEDICARE and MEDICAID PATIENTS: I certify that this patient is  under my care and that I had a face-to-face encounter that meets the physician face-to-face encounter requirements with this patient on this date. The encounter with the patient was in whole or in part for the following MEDICAL CONDITION: (primary reason for [place]) MEDICAL NECESSITY: I certify, that based on my findings, NURSING services are a medically necessary home health service. HOME BOUND STATUS: I certify that my clinical findings support that this patient is homebound (i.e., Due to ENID, BELIN. ([id number]) illness or injury, pt requires aid of supportive devices such as crutches, cane, wheelchairs, walkers, the use of special transportation or the assistance of another person to leave their place of residence. There is a normal inability to leave the home and doing so requires considerable and taxing effort. Other absences are for medical reasons / religious services and are infrequent or of short duration when for other reasons). If current dressing causes regression in wound condition, may D/C ordered dressing product/s and apply Normal Saline Moist Dressing daily until next [place] / Other MD appointment. [place] of regression in wound condition  at [phone]. Please direct any NON-WOUND related issues/requests for orders to patient's Primary Care Physician Wound #3 Left Calcaneus: [place] Nurse may visit PRN to address patient s wound care needs. FACE TO FACE ENCOUNTER: MEDICARE and MEDICAID PATIENTS: I certify that this patient is under my care and that I had a face-to-face encounter that meets the physician face-to-face encounter requirements with this patient on this date. The encounter with the patient was in whole or in part for the following MEDICAL CONDITION: (primary reason for [place]) MEDICAL NECESSITY: I certify, that based on my findings, NURSING services are a medically necessary home  health service. HOME BOUND STATUS: I certify that my clinical findings support that this patient is homebound (i.e., Due to illness or injury, pt requires aid of supportive devices such as crutches, cane, wheelchairs, walkers, the use of special transportation or the assistance of another person to leave their place of residence. There is a normal inability to leave the home and doing so requires considerable and taxing effort. Other absences are for medical reasons / religious services and are infrequent or of short duration when for other reasons). If current dressing causes regression in wound condition, may D/C ordered dressing product/s and apply Normal Saline Moist Dressing daily until next [place] / Other MD appointment. [place] of regression in wound condition at [phone]. Please direct any NON-WOUND related issues/requests for orders to patient's Primary Care Physician Wound #4 Right Calcaneus: [place] Nurse may visit PRN to address patient s wound care needs. FACE TO FACE ENCOUNTER: MEDICARE and MEDICAID PATIENTS: I certify that this patient is under my care and that I had a face-to-face encounter that meets the physician face-to-face encounter requirements with this patient on this date. The encounter with the patient was in whole or in part for the following MEDICAL CONDITION: (primary reason for [place]) MEDICAL NECESSITY: I certify, that based on my findings, NURSING services are a medically necessary home health service. HOME BOUND STATUS: I certify that my clinical findings support that this patient is homebound (i.e., Due to illness or injury, pt requires aid of supportive devices such as crutches, cane, wheelchairs, walkers, the use of special transportation or the assistance of another person to leave their place of residence. There is a normal inability to leave the home and doing so requires  considerable and taxing effort. Other absences are for medical reasons / religious services and are infrequent or of short duration when for other reasons). If current dressing causes regression in wound condition, may D/C ordered dressing product/s and apply Normal Saline Moist Dressing daily until next [place] / Other MD appointment. [place] of regression in wound condition at [phone]. Please direct any NON-WOUND related issues/requests for orders to patient's Primary Care Physician Wound #5 Right,Anterior Lower Leg: [place] Nurse may visit PRN to address patient s wound care needs. FACE TO FACE ENCOUNTER: MEDICARE and MEDICAID PATIENTS: I certify that this patient is under my care and that I had a face-to-face encounter that meets the physician face-to-face encounter requirements with this patient on this date. The encounter with the patient was in whole or in part for the following MEDICAL CONDITION: (primary reason for [place]) MEDICAL NECESSITY: I certify, that based on my findings, NURSING services are a medically necessary home health service. HOME BOUND STATUS: I certify that my clinical findings support that  this patient is homebound (i.e., Due to RITTANY, HAVINS. ([id number]) illness or injury, pt requires aid of supportive devices such as crutches, cane, wheelchairs, walkers, the use of special transportation or the assistance of another person to leave their place of residence. There is a normal inability to leave the home and doing so requires considerable and taxing effort. Other absences are for medical reasons / religious services and are infrequent or of short duration when for other reasons). If current dressing causes regression in wound condition, may D/C ordered dressing product/s and apply Normal Saline Moist Dressing daily until next [place] / Other MD appointment. [place] of regression in wound condition at [phone]. Please direct any NON-WOUND related issues/requests for orders to patient's Primary Care Physician Wound #6 Right,Posterior Lower Leg: [place] Nurse may visit PRN to address patient s wound care needs. FACE TO FACE ENCOUNTER: MEDICARE and MEDICAID PATIENTS: I certify that this patient is under my care and that I had a face-to-face encounter that meets the physician face-to-face encounter requirements with this patient on this date. The encounter with the patient was in whole or in part for the following MEDICAL CONDITION: (primary reason for [place]) MEDICAL NECESSITY: I certify, that based on my findings, NURSING services are a medically necessary home health service. HOME BOUND STATUS: I certify that my clinical findings support that this patient is homebound (i.e., Due to illness or injury, pt requires aid of supportive devices such as crutches, cane, wheelchairs, walkers, the use of special transportation or the assistance of another person to leave their place of residence. There is a normal inability to leave the home and doing so requires considerable and taxing effort. Other absences are for medical reasons / religious services and are infrequent or of short duration when for other reasons). If current dressing causes regression in wound condition, may D/C ordered dressing product/s and apply Normal Saline Moist Dressing daily until next [place] / Other MD appointment. [place] of regression in wound condition at [phone]. Please direct any NON-WOUND related issues/requests for orders to patient's Primary Care Physician Wound #7 Right,Lateral Malleolus: [place] Nurse may visit PRN to address patient s wound care needs. FACE TO FACE ENCOUNTER: MEDICARE and MEDICAID PATIENTS: I certify that this patient is  under my care and that I had a face-to-face encounter that meets the physician face-to-face encounter requirements with this patient on this date. The encounter with the patient was in whole or in part for the following MEDICAL CONDITION: (primary reason for [place]) MEDICAL NECESSITY: I certify, that based on my findings, NURSING services are a medically necessary home health service. HOME BOUND STATUS: I certify that my clinical findings support that this patient is homebound (i.e., Due to illness or injury, pt requires aid of supportive devices such as crutches, cane, wheelchairs, walkers, the use of special transportation or the assistance of another person to leave their place of residence. There is a normal inability to leave the home and doing so requires considerable and taxing effort. Other absences are for medical reasons / religious services and are infrequent or of short duration when for other reasons). If current dressing causes regression in wound condition, may D/C ordered dressing product/s and apply Normal Saline Moist Dressing daily until next [place] / Other MD appointment. [place] of regression in wound condition at [phone]. Please direct any  NON-WOUND related issues/requests for orders to patient's Primary Care Physician Medications-please add to medication list.: Wound #1 Left,Dorsal Foot: Other: - Vitamin C, Zinc, MVI Wound #2 Right,Dorsal Foot: Other: - Vitamin C, Zinc, MVI Wound #3 Left Calcaneus: Other: - Vitamin C, Zinc, MVI Wound #4 Right Calcaneus: Other: - Vitamin C, Zinc, MVI Mecham, Dally R. ([id number]) Wound #5 Right,Anterior Lower Leg: Other: - Vitamin C, Zinc, MVI Wound #6 Right,Posterior Lower Leg: Other: - Vitamin C, Zinc, MVI Wound #7 Right,Lateral Malleolus: Other: - Vitamin C, Zinc, MVI The following medication(s) was prescribed: doxycycline monohydrate oral 100 mg capsule bid bid capsule oral starting  01/18/2017 doxycycline 100 bid for 10 days for cellulitis on right medial foot no change to dressings MRI of right foot, if she has extensive bone destruction she may require an amputation Electronic Signature(s) Signed: 01/19/2017 4:28:51 PM By: Linton Ham MD Entered By: Linton Ham on 01/18/2017 14:59:36 [name] ([id number]) -------------------------------------------------------------------------------- SuperBill Details Patient Name: [name] Date of Service: 01/18/2017 Medical Record Patient Account Number: [id number] Number: Treating RN: Cornell Barman [birth year] (83 y.o. 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

## 2017-01-20 NOTE — Progress Notes (Signed)
[name] ([id number]) Visit Report for 01/18/2017 Arrival Information Details Patient Name: [name], [name] Date of Service: 01/18/2017 12:30 PM Medical Record Patient Account Number: [id number] Number: Treating RN: Cornell Barman [birth year] (83 y.o. Other Clinician: Date of Birth/Sex: Female) Treating ROBSON, Leon Primary Care Vicy Medico: Emily Filbert Tydus Sanmiguel/Extender: G Referring Kealan Buchan: Melina Modena in Treatment: 2 Visit Information History Since Last Visit Added or deleted any medications: No Patient Arrived: Wheel Chair Any new allergies or adverse reactions: No Arrival Time: 12:55 Had a fall or experienced change in No activities of daily living that may affect Accompanied By: husband risk of falls: Transfer Assistance: [employer] fast streamer Signs or symptoms of abuse/neglect since last No Patient Identification Verified: Yes visito Secondary Verification Process Yes Has Dressing in Place as Prescribed: Yes Completed: Pain Present Now: No Patient Requires Transmission-Based No Precautions: Patient Has Alerts: Yes Patient Alerts: DM II Electronic Signature(s) Signed: 01/18/2017 5:09:04 PM By: Gretta Cool, RN, BSN, Kim RN, BSN Entered By: Gretta Cool, RN, BSN, Kim on 01/18/2017 13:05:44 [name] ([id number]) -------------------------------------------------------------------------------- Compression Therapy Details Patient Name: [name] Date of Service: 01/18/2017 12:30 PM Medical Record Patient Account Number: [id number] Number: Treating RN: Cornell Barman Nov 24, 1934 (83 y.o. Other Clinician: Date of Birth/Sex: Female) Treating ROBSON, MICHAEL Primary Care Imran Nuon: Emily Filbert Lajoyce Tamura/Extender: G Referring Skye Rodarte: Melina Modena in Treatment: 2 Compression Therapy Performed for Wound Wound #4 Right Calcaneus Assessment: Performed By: Clinician Cornell Barman, RN Compression Type: Three Layer Post Procedure Diagnosis Same as  Pre-procedure Electronic Signature(s) Signed: 01/18/2017 5:09:04 PM By: Gretta Cool, RN, BSN, Kim RN, BSN Entered By: Gretta Cool, RN, BSN, Kim on 01/18/2017 14:15:37 [name] ([id number]) -------------------------------------------------------------------------------- Encounter Discharge Information Details Patient Name: [name] Date of Service: 01/18/2017 12:30 PM Medical Record Patient Account Number: [id number] Number: Treating RN: Ahmed Prima Oct 12, 1934 (83 y.o. Other Clinician: Date of Birth/Sex: Female) Treating ROBSON, MICHAEL Primary Care Labella Zahradnik: Emily Filbert Reginal Wojcicki/Extender: G Referring Tarren Sabree: Melina Modena in Treatment: 2 Encounter Discharge Information Items Schedule Follow-up Appointment: No Medication Reconciliation completed No and provided to Patient/Care Korie Streat: Provided on Clinical Summary of Care: 01/18/2017 Form Type Recipient Paper Patient MB Electronic Signature(s) Signed: 01/18/2017 2:15:13 PM By: Ruthine Dose Entered By: Ruthine Dose on 01/18/2017 14:15:13 [name] ([id number]) -------------------------------------------------------------------------------- Multi Wound Chart Details Patient Name: [name] Date of Service: 01/18/2017 12:30 PM Medical Record Patient Account Number: [id number] Number: Treating RN: Cornell Barman 1934/02/17 (83 y.o. Other Clinician: Date of Birth/Sex: Female) Treating ROBSON, MICHAEL Primary Care Aubery Date: Emily Filbert Azavion Bouillon/Extender: G Referring Jacquan Savas: Melina Modena in Treatment: 2 Vital Signs Height(in): 61 Pulse(bpm): 96 Weight(lbs): 206 Blood Pressure 97/85 (mmHg): Body Mass Index(BMI): 39 Temperature(F): 97.9 Respiratory Rate 16 (breaths/min): Photos: Wound Location: Left Foot - Dorsal Right Foot - Dorsal Left Calcaneus Wounding Event: Gradually Appeared Gradually Appeared Pressure Injury Primary Etiology: Diabetic Wound/Ulcer of Diabetic Wound/Ulcer  of Pressure Ulcer the Lower Extremity the Lower Extremity Comorbid History: Sleep Apnea, Arrhythmia, Sleep Apnea, Arrhythmia, Sleep Apnea, Arrhythmia, Hypertension, Cirrhosis , Hypertension, Cirrhosis , Hypertension, Cirrhosis , Type II Diabetes, End Type II Diabetes, End Type II Diabetes, End Stage Renal Disease, Stage Renal Disease, Stage Renal Disease, Gout, Osteoarthritis Gout, Osteoarthritis Gout, Osteoarthritis Date Acquired: 11/04/2016 11/04/2016 11/04/2016 Weeks of Treatment: 2 2 2  Wound Status: Open Open Open Measurements L x W x D 4x5.5x0.6 6x3.2x0.5 3.5x8x0.5 (cm) Area (cm) : 17.279 15.08 21.991 Volume (cm) : 10.367 7.54 10.996 % Reduction in Area: -22.20% 20.00% 22.20% %  Reduction in Volume: -266.70% -100.00% -289.00% Classification: Grade 1 Grade 2 Category/Stage II HBO Classification: N/A N/A Grade 1 Exudate Amount: Medium Large Large Exudate Type: Serous Purulent Serous Exudate Color: amber yellow, brown, green amber LEIGHNA, WORMAN R. ([id number]) Wound Margin: Well defined, not attached Distinct, outline attached Distinct, outline attached Granulation Amount: None Present (0%) Small (1-33%) None Present (0%) Granulation Quality: N/A Pink N/A Necrotic Amount: Large (67-100%) Large (67-100%) Large (67-100%) Necrotic Tissue: Eschar, Adherent Slough Eschar, Adherent Slough Eschar Exposed Structures: Fat Layer (Subcutaneous Fat Layer (Subcutaneous N/A Tissue) Exposed: Yes Tissue) Exposed: Yes Fascia: No Fascia: No Tendon: No Tendon: No Muscle: No Muscle: No Joint: No Joint: No Bone: No Bone: No Epithelialization: None None None Periwound Skin Texture: Excoriation: Yes No Abnormalities Noted No Abnormalities Noted Induration: Yes Callus: Yes Crepitus: Yes Rash: Yes Scarring: Yes Periwound Skin Maceration: Yes Maceration: Yes No Abnormalities Noted Moisture: Dry/Scaly: Yes Periwound Skin Color: Atrophie Blanche: Yes Erythema: Yes Erythema: Yes Cyanosis:  Yes Ecchymosis: Yes Erythema: Yes Hemosiderin Staining: Yes Mottled: Yes Pallor: Yes Rubor: Yes Erythema Location: N/A Circumferential Circumferential Temperature: N/A No Abnormality No Abnormality Tenderness on No Yes Yes Palpation: Wound Preparation: Ulcer Cleansing: Other: Ulcer Cleansing: Ulcer Cleansing: soap and water Rinsed/Irrigated with Rinsed/Irrigated with Saline Saline Topical Anesthetic Applied: Other: lidocaine Topical Anesthetic Topical Anesthetic 4% Applied: Other: Applied: Other: LIDOCAINE 4% LIDOCAINE 4% Procedures Performed: N/A N/A N/A Wound Number: [id number]  Photos: Wound Location: Right Calcaneus Right Lower Leg - Anterior Right Lower Leg - Posterior DARIELA, WINKLEPLECK. ([id number]) Wounding Event: Pressure Injury Gradually Appeared Gradually Appeared Primary Etiology: Pressure Ulcer 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 the Lower Extremity Comorbid History: Sleep Apnea, Arrhythmia, Sleep Apnea, Arrhythmia, Sleep Apnea, Arrhythmia, Hypertension, Cirrhosis , Hypertension, Cirrhosis , Hypertension, Cirrhosis , Type II Diabetes, End Type II Diabetes, End Type II Diabetes, End Stage Renal Disease, Stage Renal Disease, Stage Renal Disease, Gout, Osteoarthritis Gout, Osteoarthritis Gout, Osteoarthritis Date Acquired: 11/04/2016 11/04/2016 11/04/2016 Weeks of Treatment: 2 2 2  Wound Status: Open Open Open Measurements L x W x D 6.5x2.5x0.6 0.9x0.8x0.3 5.4x2x1.8 (cm) Area (cm) : 12.763 0.565 8.482 Volume (cm) : 7.658 0.17 15.268 % Reduction in Area: 35.00% 28.00% 16.90% % Reduction in Volume: -95.00% -115.20% 0.30% Classification: Category/Stage II Grade 1 Grade 2 HBO Classification: Grade 1 N/A N/A Exudate Amount: Large Large Large Exudate Type: Purulent Serosanguineous Purulent Exudate Color: yellow, brown, green red, brown yellow, brown, green Wound Margin: Distinct, outline attached Distinct, outline attached Distinct, outline  attached Granulation Amount: None Present (0%) Large (67-100%) None Present (0%) Granulation Quality: N/A Red N/A Necrotic Amount: Large (67-100%) Small (1-33%) Large (67-100%) Necrotic Tissue: Eschar, Adherent [place] Exposed Structures: N/A Fat Layer (Subcutaneous N/A Tissue) Exposed: Yes Epithelialization: None None None Periwound Skin Texture: No Abnormalities Noted No Abnormalities Noted No Abnormalities Noted Periwound Skin No Abnormalities Noted No Abnormalities Noted Maceration: Yes Moisture: Periwound Skin Color: Erythema: Yes No Abnormalities Noted Erythema: Yes Erythema Location: Circumferential N/A Circumferential Temperature: No Abnormality No Abnormality No Abnormality Tenderness on Yes Yes Yes Palpation: Wound Preparation: Ulcer Cleansing: Ulcer Cleansing: Ulcer Cleansing: Rinsed/Irrigated with Rinsed/Irrigated with Rinsed/Irrigated with Saline Saline Saline, Other: soap and water Topical Anesthetic Topical Anesthetic Applied: Other: Applied: Other: Topical Anesthetic LIDOCAINE 4% LIDOCAINE 4% Applied: Other: LIDOCAINE 4% Procedures Performed: Compression Therapy N/A N/A Wound Number: 7 [id number] N/A Hilton, Hildegard R. ([id number]) Photos: N/A Wound Location: Right, Lateral Malleolus Right Lower Leg - Lateral, N/A Posterior Wounding Event: Gradually Appeared  Gradually Appeared N/A 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 Comorbid History: N/A Sleep Apnea, Arrhythmia, N/A Hypertension, Cirrhosis , Type II Diabetes, End Stage Renal Disease, Gout, Osteoarthritis Date Acquired: 01/11/2017 01/10/2017 N/A Weeks of Treatment: 1 0 N/A Wound Status: Open Open N/A Measurements L x W x D 0.8x0.9x0.3 0.3x0.8x0.1 N/A (cm) Area (cm) : 0.565 0.188 N/A Volume (cm) : 0.17 0.019 N/A % Reduction in Area: -12.30% N/A N/A % Reduction in Volume: -12.60% N/A N/A Classification: Grade 2 Partial Thickness N/A HBO  Classification: N/A Grade 1 N/A Exudate Amount: N/A Small N/A Exudate Type: N/A Serosanguineous N/A Exudate Color: N/A red, brown N/A Wound Margin: N/A Distinct, outline attached N/A Granulation Amount: N/A Small (1-33%) N/A Granulation Quality: N/A Pink N/A Necrotic Amount: N/A Medium (34-66%) N/A Necrotic Tissue: N/A Adherent Slough N/A Exposed Structures: N/A Fascia: No N/A Fat Layer (Subcutaneous Tissue) Exposed: No Tendon: No Muscle: No Joint: No Bone: No Limited to Skin Breakdown Epithelialization: N/A None N/A Periwound Skin Texture: No Abnormalities Noted No Abnormalities Noted N/A Periwound Skin No Abnormalities Noted No Abnormalities Noted N/A Moisture: Periwound Skin Color: No Abnormalities Noted No Abnormalities Noted N/A Erythema Location: N/A N/A N/A Temperature: N/A N/A N/A Lepera, Mozella R. ([id number]) Tenderness on No No N/A Palpation: Wound Preparation: N/A Ulcer Cleansing: N/A Rinsed/Irrigated with Saline Procedures Performed: N/A N/A N/A Treatment Notes Wound #1 (Left, Dorsal Foot) 1. Cleansed with: Clean wound with Normal Saline 4. Dressing Applied: Iodoflex 5. Secondary Dressing Applied ABD Pad 7. Secured with 3 Layer Compression System - Bilateral Wound #2 (Right, Dorsal Foot) 1. Cleansed with: Clean wound with Normal Saline 4. Dressing Applied: Iodoflex 5. Secondary Dressing Applied ABD Pad 7. Secured with 3 Layer Compression System - Bilateral Wound #3 (Left Calcaneus) 1. Cleansed with: Cleanse wound with antibacterial soap and water 4. Dressing Applied: Medihoney Gel 5. Secondary Dressing Applied Foam 7. Secured with 3 Layer Compression System - Bilateral Wound #4 (Right Calcaneus) 1. Cleansed with: Cleanse wound with antibacterial soap and water 4. Dressing Applied: Medihoney Gel 5. Secondary Dressing Applied Foam 7. Secured with SANTASIA, SCHOMBERG R. ([id number]) 3 Layer Compression System - Bilateral Wound #5 (Right, Anterior  Lower Leg) 1. Cleansed with: Cleanse wound with antibacterial soap and water 4. Dressing Applied: Prisma Ag 5. Secondary Dressing Applied ABD Pad 7. Secured with 3 Layer Compression System - Bilateral Wound #6 (Right, Posterior Lower Leg) 1. Cleansed with: Cleanse wound with antibacterial soap and water 4. Dressing Applied: Prisma Ag 5. Secondary Dressing Applied ABD Pad 7. Secured with 3 Layer Compression System - Bilateral Wound #7 (Right, Lateral Malleolus) 1. Cleansed with: Cleanse wound with antibacterial soap and water 4. Dressing Applied: Prisma Ag 5. Secondary Dressing Applied ABD Pad 7. Secured with 3 Layer Compression System - Bilateral Wound #8 (Right, Lateral, Posterior Lower Leg) 1. Cleansed with: Cleanse wound with antibacterial soap and water 4. Dressing Applied: Prisma Ag 5. Secondary Dressing Applied ABD Pad 7. Secured with 3 Layer Compression System - Bilateral Electronic Signature(s) Signed: 01/19/2017 4:28:51 PM By: Linton Ham MD [place], [name] ([id number]) Entered By: Linton Ham on 01/18/2017 14:24:19 [name] ([id number]) -------------------------------------------------------------------------------- Pain Assessment Details Patient Name: [name] Date of Service: 01/18/2017 12:30 PM Medical Record Patient Account Number: [id number] Number: Treating RN: Cornell Barman Nov 10, 1934 (83 y.o. Other Clinician: Date of Birth/Sex: Female) Treating ROBSON, MICHAEL Primary Care Adler Alton: Emily Filbert Kourtnie Sachs/Extender: G Referring Siani Utke: Melina Modena in Treatment: 2 Active Problems Location of  Pain Severity and Description of Pain Patient Has Paino Yes Site Locations Pain Management and Medication Current Pain Management: Electronic Signature(s) Signed: 01/18/2017 5:09:04 PM By: Gretta Cool, RN, BSN, Kim RN, BSN Entered By: Gretta Cool, RN, BSN, Kim on 01/18/2017 13:06:25 [name]  ([id number]) -------------------------------------------------------------------------------- Patient/Caregiver Education Details Patient Name: [name] Date of Service: 01/18/2017 12:30 PM Medical Record Patient Account Number: [id number] Number: Treating RN: Cornell Barman 1934/11/18 (83 y.o. Other Clinician: Date of Birth/Gender: Female) Treating ROBSON, [place] Primary Care Physician: Emily Filbert Physician/Extender: G Referring Physician: Melina Modena in Treatment: 2 Education Assessment Education Provided To: Patient and Caregiver Education Topics Provided Tissue Oxygenation: Venous: Controlling Swelling with Multilayered Compression Wraps, Other: Wraps to be changed Handouts: tues/thurs. Methods: Demonstration, Explain/Verbal Responses: State content correctly Welcome [facility]: Electronic Signature(s) Signed: 01/18/2017 5:09:04 PM By: Gretta Cool, RN, BSN, Kim RN, BSN Entered By: Gretta Cool, RN, BSN, Kim on 01/18/2017 14:18:40 [name] ([id number]) -------------------------------------------------------------------------------- Wound Assessment Details Patient Name: [name] Date of Service: 01/18/2017 12:30 PM Medical Record Patient Account Number: [id number] Number: Treating RN: Cornell Barman 1934/09/15 (83 y.o. Other Clinician: Date of Birth/Sex: Female) Treating ROBSON, [place] Primary Care Seleena Reimers: Emily Filbert Jezabella Schriever/Extender: G Referring Carry Weesner: Melina Modena in Treatment: 2 Wound Status Wound Number: 1 Primary Diabetic Wound/Ulcer of the Lower Etiology: Extremity Wound Location: Left Foot - Dorsal Wound Open Wounding Event: Gradually Appeared Status: Date Acquired: 11/04/2016 Comorbid Sleep Apnea, Arrhythmia, Hypertension, Weeks Of Treatment: 2 History: Cirrhosis , Type II Diabetes, End Stage Clustered Wound: No Renal Disease, Gout, Osteoarthritis Photos Wound Measurements Length: (cm) 4 Width: (cm)  5.5 Depth: (cm) 0.6 Area: (cm) 17.279 Volume: (cm) 10.367 % Reduction in Area: -22.2% % Reduction in Volume: -266.7% Epithelialization: None Tunneling: No Undermining: No Wound Description Classification: Grade 1 Foul Odor Afte Wound Margin: Well defined, not attached Slough/Fibrino Exudate Amount: Medium Exudate Type: Serous Exudate Color: amber r Cleansing: No Yes Wound Bed Granulation Amount: None Present (0%) Exposed Structure Necrotic Amount: Large (67-100%) Fascia Exposed: No Necrotic Quality: Eschar, Adherent Slough Fat Layer (Subcutaneous Tissue) Exposed: Yes Tendon Exposed: No Muscle Exposed: No Inglett, Isamar R. ([id number]) Joint Exposed: No Bone Exposed: No Periwound Skin Texture Texture Color No Abnormalities Noted: No No Abnormalities Noted: No Callus: Yes Atrophie Blanche: Yes Crepitus: Yes Cyanosis: Yes Excoriation: Yes Ecchymosis: Yes Induration: Yes Erythema: Yes Rash: Yes Hemosiderin Staining: Yes Scarring: Yes Mottled: Yes Pallor: Yes Moisture Rubor: Yes No Abnormalities Noted: No Dry / Scaly: Yes Maceration: Yes Wound Preparation Ulcer Cleansing: Other: soap and water, Topical Anesthetic Applied: Other: lidocaine 4%, Treatment Notes Wound #1 (Left, Dorsal Foot) 1. Cleansed with: Clean wound with Normal Saline 4. Dressing Applied: Iodoflex 5. Secondary Dressing Applied ABD Pad 7. Secured with 3 Layer Compression System - Bilateral Electronic Signature(s) Signed: 01/18/2017 5:09:04 PM By: Gretta Cool, RN, BSN, Kim RN, BSN Entered By: Gretta Cool, RN, BSN, Kim on 01/18/2017 13:25:41 [name] ([id number]) -------------------------------------------------------------------------------- Wound Assessment Details Patient Name: [name] Date of Service: 01/18/2017 12:30 PM Medical Record Patient Account Number: [id number] Number: Treating RN: Cornell Barman Apr 23, 1934 (83 y.o. Other Clinician: Date of Birth/Sex: Female)  Treating ROBSON, MICHAEL Primary Care Dreama Kuna: Emily Filbert Danell Verno/Extender: G Referring Breniya Goertzen: Melina Modena in Treatment: 2 Wound Status Wound Number: 2 Primary Diabetic Wound/Ulcer of the Lower Etiology: Extremity Wound Location: Right Foot - Dorsal Wound Open Wounding Event: Gradually Appeared Status: Date Acquired: 11/04/2016 Comorbid Sleep Apnea, Arrhythmia, Hypertension, Weeks Of Treatment: 2  History: Cirrhosis , Type II Diabetes, End Stage Clustered Wound: No Renal Disease, Gout, Osteoarthritis Photos Wound Measurements Length: (cm) 6 Width: (cm) 3.2 Depth: (cm) 0.5 Area: (cm) 15.08 Volume: (cm) 7.54 % Reduction in Area: 20% % Reduction in Volume: -100% Epithelialization: None Tunneling: No Undermining: No Wound Description Classification: Grade 2 Foul Odor Aft Wound Margin: Distinct, outline attached Slough/Fibrin Exudate Amount: Large Exudate Type: Purulent Exudate Color: yellow, brown, green er Cleansing: No o Yes Wound Bed Granulation Amount: Small (1-33%) Exposed Structure Granulation Quality: Pink Fascia Exposed: No Necrotic Amount: Large (67-100%) Fat Layer (Subcutaneous Tissue) Exposed: Yes Necrotic Quality: Eschar, Adherent Slough Tendon Exposed: No Muscle Exposed: No Suriano, Nilaya R. ([id number]) Joint Exposed: No Bone Exposed: No Periwound Skin Texture Texture Color No Abnormalities Noted: No No Abnormalities Noted: No Erythema: Yes Moisture Erythema Location: Circumferential No Abnormalities Noted: No Maceration: Yes Temperature / Pain Temperature: No Abnormality Tenderness on Palpation: Yes Wound Preparation Ulcer Cleansing: Rinsed/Irrigated with Saline Topical Anesthetic Applied: Other: LIDOCAINE 4%, Treatment Notes Wound #2 (Right, Dorsal Foot) 1. Cleansed with: Clean wound with Normal Saline 4. Dressing Applied: Iodoflex 5. Secondary Dressing Applied ABD Pad 7. Secured with 3 Layer Compression System -  Bilateral Electronic Signature(s) Signed: 01/18/2017 5:09:04 PM By: Gretta Cool, RN, BSN, Kim RN, BSN Entered By: Gretta Cool, RN, BSN, Kim on 01/18/2017 13:26:41 [name] ([id number]) -------------------------------------------------------------------------------- Wound Assessment Details Patient Name: [name] Date of Service: 01/18/2017 12:30 PM Medical Record Patient Account Number: [id number] Number: Treating RN: Cornell Barman 13-Feb-1934 (83 y.o. Other Clinician: Date of Birth/Sex: Female) Treating ROBSON, MICHAEL Primary Care Dayami Taitt: Emily Filbert Logen Fowle/Extender: G Referring Devontaye Ground: Melina Modena in Treatment: 2 Wound Status Wound Number: 3 Primary Pressure Ulcer Etiology: Wound Location: Left Calcaneus Wound Open Wounding Event: Pressure Injury Status: Date Acquired: 11/04/2016 Comorbid Sleep Apnea, Arrhythmia, Hypertension, Weeks Of Treatment: 2 History: Cirrhosis , Type II Diabetes, End Stage Clustered Wound: No Renal Disease, Gout, Osteoarthritis Photos Wound Measurements Length: (cm) 3.5 Width: (cm) 8 Depth: (cm) 0.5 Area: (cm) 21.991 Volume: (cm) 10.996 % Reduction in Area: 22.2% % Reduction in Volume: -289% Epithelialization: None Tunneling: No Undermining: No Wound Description Classification: Category/Stage II Foul O Diabetic Severity (Wagner): Grade 1 Slough Wound Margin: Distinct, outline attached Exudate Amount: Large Exudate Type: Serous Exudate Color: amber dor After Cleansing: No /Fibrino No Wound Bed Granulation Amount: None Present (0%) Necrotic Amount: Large (67-100%) Necrotic Quality: JAMIELA, BLUMENSCHEIN R. ([id number]) Periwound Skin Texture Texture Color No Abnormalities Noted: No No Abnormalities Noted: No Erythema: Yes Moisture Erythema Location: Circumferential No Abnormalities Noted: No Temperature / Pain Temperature: No Abnormality Tenderness on Palpation: Yes Wound Preparation Ulcer Cleansing:  Rinsed/Irrigated with Saline Topical Anesthetic Applied: Other: LIDOCAINE 4%, Treatment Notes Wound #3 (Left Calcaneus) 1. Cleansed with: Cleanse wound with antibacterial soap and water 4. Dressing Applied: Medihoney Gel 5. Secondary Dressing Applied Foam 7. Secured with 3 Layer Compression System - Bilateral Electronic Signature(s) Signed: 01/18/2017 5:09:04 PM By: Gretta Cool, RN, BSN, Kim RN, BSN Entered By: Gretta Cool, RN, BSN, Kim on 01/18/2017 13:27:30 [name] ([id number]) -------------------------------------------------------------------------------- Wound Assessment Details Patient Name: [name] Date of Service: 01/18/2017 12:30 PM Medical Record Patient Account Number: [id number] Number: Treating RN: Cornell Barman 06/28/1934 (83 y.o. Other Clinician: Date of Birth/Sex: Female) Treating ROBSON, MICHAEL Primary Care Reina Wilton: Emily Filbert Teng Decou/Extender: G Referring Jashae Wiggs: Melina Modena in Treatment: 2 Wound Status Wound Number: 4 Primary Pressure Ulcer Etiology: Wound Location: Right Calcaneus Wound Open Wounding Event: Pressure Injury  Status: Date Acquired: 11/04/2016 Comorbid Sleep Apnea, Arrhythmia, Hypertension, Weeks Of Treatment: 2 History: Cirrhosis , Type II Diabetes, End Stage Clustered Wound: No Renal Disease, Gout, Osteoarthritis Photos Wound Measurements Length: (cm) 6.5 Width: (cm) 2.5 Depth: (cm) 0.6 Area: (cm) 12.763 Volume: (cm) 7.658 % Reduction in Area: 35% % Reduction in Volume: -95% Epithelialization: None Tunneling: No Undermining: No Wound Description Classification: Category/Stage II Foul O Diabetic Severity (Wagner): Grade 1 Slough Wound Margin: Distinct, outline attached Exudate Amount: Large Exudate Type: Purulent Exudate Color: yellow, brown, green dor After Cleansing: No /Fibrino No Wound Bed Granulation Amount: None Present (0%) Necrotic Amount: Large (67-100%) Necrotic Quality: Eschar,  Adherent [address], [address] R[address] ([id number]) Periwound Skin Texture Texture Color No Abnormalities Noted: No No Abnormalities Noted: No Erythema: Yes Moisture Erythema Location: Circumferential No Abnormalities Noted: No Temperature / Pain Temperature: No Abnormality Tenderness on Palpation: Yes Wound Preparation Ulcer Cleansing: Rinsed/Irrigated with Saline Topical Anesthetic Applied: Other: LIDOCAINE 4%, Treatment Notes Wound #4 (Right Calcaneus) 1. Cleansed with: Cleanse wound with antibacterial soap and water 4. Dressing Applied: Medihoney Gel 5. Secondary Dressing Applied Foam 7. Secured with 3 Layer Compression System - Bilateral Electronic Signature(s) Signed: 01/18/2017 5:09:04 PM By: Gretta Cool, RN, BSN, Kim RN, BSN Entered By: Gretta Cool, RN, BSN, Kim on 01/18/2017 13:28:21 TASHENNA, WELLBAUM ([id number]) -------------------------------------------------------------------------------- Wound Assessment Details Patient Name: [name] Date of Service: 01/18/2017 12:30 PM Medical Record Patient Account Number: [id number] Number: Treating RN: Cornell Barman 01-Mar-1934 (83 y.o. Other Clinician: Date of Birth/Sex: Female) Treating ROBSON, [place] Primary Care Brody Bonneau: Emily Filbert Jullien Granquist/Extender: G Referring Issachar Broady: Melina Modena in Treatment: 2 Wound Status Wound Number: 5 Primary Diabetic Wound/Ulcer of the Lower Etiology: Extremity Wound Location: Right Lower Leg - Anterior Wound Open Wounding Event: Gradually Appeared Status: Date Acquired: 11/04/2016 Comorbid Sleep Apnea, Arrhythmia, Hypertension, Weeks Of Treatment: 2 History: Cirrhosis , Type II Diabetes, End Stage Clustered Wound: No Renal Disease, Gout, Osteoarthritis Photos Wound Measurements Length: (cm) 0.9 Width: (cm) 0.8 Depth: (cm) 0.3 Area: (cm) 0.565 Volume: (cm) 0.17 % Reduction in Area: 28% % Reduction in Volume: -115.2% Epithelialization: None Tunneling: No Undermining:  No Wound Description Classification: Grade 1 Foul Odor Aft Wound Margin: Distinct, outline attached Slough/Fibrin Exudate Amount: Large Exudate Type: Serosanguineous Exudate Color: red, brown er Cleansing: No o No Wound Bed Granulation Amount: Large (67-100%) Exposed Structure Granulation Quality: Red Fat Layer (Subcutaneous Tissue) Exposed: Yes Necrotic Amount: Small (1-33%) Necrotic Quality: BERTINE, TERSIGNI R. ([id number]) Periwound Skin Texture Texture Color No Abnormalities Noted: No No Abnormalities Noted: No Moisture Temperature / Pain No Abnormalities Noted: No Temperature: No Abnormality Tenderness on Palpation: Yes Wound Preparation Ulcer Cleansing: Rinsed/Irrigated with Saline Topical Anesthetic Applied: Other: LIDOCAINE 4%, Treatment Notes Wound #5 (Right, Anterior Lower Leg) 1. Cleansed with: Cleanse wound with antibacterial soap and water 4. Dressing Applied: Prisma Ag 5. Secondary Dressing Applied ABD Pad 7. Secured with 3 Layer Compression System - Bilateral Electronic Signature(s) Signed: 01/18/2017 5:09:04 PM By: Gretta Cool, RN, BSN, Kim RN, BSN Entered By: Gretta Cool, RN, BSN, Kim on 01/18/2017 13:29:40 [name] ([id number]) -------------------------------------------------------------------------------- Wound Assessment Details Patient Name: [name] Date of Service: 01/18/2017 12:30 PM Medical Record Patient Account Number: [id number] Number: Treating RN: Cornell Barman 12/30/1933 (83 y.o. Other Clinician: Date of Birth/Sex: Female) Treating ROBSON, MICHAEL Primary Care Levy Wellman: Emily Filbert Cori Justus/Extender: G Referring Hermela Hardt: Melina Modena in Treatment: 2 Wound Status Wound Number: 6 Primary Diabetic Wound/Ulcer of the Lower Etiology: Extremity Wound Location: Right  Lower Leg - Posterior Wound Open Wounding Event: Gradually Appeared Status: Date Acquired: 11/04/2016 Comorbid Sleep Apnea, Arrhythmia,  Hypertension, Weeks Of Treatment: 2 History: Cirrhosis , Type II Diabetes, End Stage Clustered Wound: No Renal Disease, Gout, Osteoarthritis Photos Wound Measurements Length: (cm) 5.4 Width: (cm) 2 Depth: (cm) 1.8 Area: (cm) 8.482 Volume: (cm) 15.268 % Reduction in Area: 16.9% % Reduction in Volume: 0.3% Epithelialization: None Tunneling: No Undermining: No Wound Description Classification: Grade 2 Foul Odor Afte Wound Margin: Distinct, outline attached Slough/Fibrino Exudate Amount: Large Exudate Type: Purulent Exudate Color: yellow, brown, green r Cleansing: No No Wound Bed Granulation Amount: None Present (0%) Necrotic Amount: Large (67-100%) Necrotic Quality: Adherent [id number] MOLLYANNE, SCHWARZKOPF R. ([id number]) Texture Color No Abnormalities Noted: No No Abnormalities Noted: No Erythema: Yes Moisture Erythema Location: Circumferential No Abnormalities Noted: No Maceration: Yes Temperature / Pain Temperature: No Abnormality Tenderness on Palpation: Yes Wound Preparation Ulcer Cleansing: Rinsed/Irrigated with Saline, Other: soap and water, Topical Anesthetic Applied: Other: LIDOCAINE 4%, Treatment Notes Wound #6 (Right, Posterior Lower Leg) 1. Cleansed with: Cleanse wound with antibacterial soap and water 4. Dressing Applied: Prisma Ag 5. Secondary Dressing Applied ABD Pad 7. Secured with 3 Layer Compression System - Bilateral Electronic Signature(s) Signed: 01/18/2017 5:09:04 PM By: Gretta Cool, RN, BSN, Kim RN, BSN Entered By: Gretta Cool, RN, BSN, Kim on 01/18/2017 13:30:49 [name] ([id number]) -------------------------------------------------------------------------------- Wound Assessment Details Patient Name: [name] Date of Service: 01/18/2017 12:30 PM Medical Record Patient Account Number: [id number] Number: Treating RN: Cornell Barman 09-May-1934 (83 y.o. Other Clinician: Date of Birth/Sex: Female) Treating ROBSON,  MICHAEL Primary Care Michele Kerlin: Emily Filbert Reeves Musick/Extender: G Referring Ruchi Stoney: Melina Modena in Treatment: 2 Wound Status Wound Number: 7 Primary Diabetic Wound/Ulcer of the Lower Etiology: Extremity Wound Location: Right, Lateral Malleolus Wound Status: Open Wounding Event: Gradually Appeared Date Acquired: 01/11/2017 Weeks Of Treatment: 1 Clustered Wound: No Photos Photo Uploaded By: Gretta Cool, RN, BSN, Kim on 01/18/2017 14:22:48 Wound Measurements Length: (cm) 0.8 Width: (cm) 0.9 Depth: (cm) 0.3 Area: (cm) 0.565 Volume: (cm) 0.17 % Reduction in Area: -12.3% % Reduction in Volume: -12.6% Wound Description Classification: Grade 2 Periwound Skin Texture Texture Color No Abnormalities Noted: No No Abnormalities Noted: No Moisture No Abnormalities Noted: No Treatment Notes Wound #7 (Right, Lateral Malleolus) 1. Cleansed withKIFFANY, RINGHAM R. ([id number]) Cleanse wound with antibacterial soap and water 4. Dressing Applied: Prisma Ag 5. Secondary Dressing Applied ABD Pad 7. Secured with 3 Layer Compression System - Bilateral Electronic Signature(s) Signed: 01/18/2017 5:09:04 PM By: Gretta Cool, RN, BSN, Kim RN, BSN Entered By: Gretta Cool, RN, BSN, Kim on 01/18/2017 13:31:25 [name] ([id number]) -------------------------------------------------------------------------------- Wound Assessment Details Patient Name: [name] Date of Service: 01/18/2017 12:30 PM Medical Record Patient Account Number: [id number] Number: Treating RN: Cornell Barman 1934-10-15 (83 y.o. Other Clinician: Date of Birth/Sex: Female) Treating ROBSON, MICHAEL Primary Care Barbie Croston: Emily Filbert Latavia Goga/Extender: G Referring Sibbie Flammia: Melina Modena in Treatment: 2 Wound Status Wound Number: 8 Primary Venous Leg Ulcer Etiology: Wound Location: Right Lower Leg - Lateral, Posterior Wound Open Status: Wounding Event: Gradually Appeared Comorbid Sleep Apnea, Arrhythmia,  Hypertension, Date Acquired: 01/10/2017 History: Cirrhosis , Type II Diabetes, End Stage Weeks Of Treatment: 0 Renal Disease, Gout, Osteoarthritis Clustered Wound: No Photos Wound Measurements Length: (cm) 0.3 Width: (cm) 0.8 Depth: (cm) 0.1 Area: (cm) 0.188 Volume: (cm) 0.019 % Reduction in Area: % Reduction in Volume: Epithelialization: None Tunneling: No Undermining: No Wound Description Classification: Partial Thickness Foul  O Diabetic Severity Earleen Newport): Grade 1 Slough Wound Margin: Distinct, outline attached Exudate Amount: Small Exudate Type: Serosanguineous Exudate Color: red, brown dor After Cleansing: No /Fibrino No Wound Bed Granulation Amount: Small (1-33%) Exposed Structure Granulation Quality: Pink Fascia Exposed: No Necrotic Amount: Medium (34-66%) Fat Layer (Subcutaneous Tissue) Exposed: No Necrotic Quality: Adherent Slough Tendon Exposed: No Goodner, Exie R. ([id number]) Muscle Exposed: No Joint Exposed: No Bone Exposed: No Limited to Skin Breakdown Periwound Skin Texture Texture Color No Abnormalities Noted: No No Abnormalities Noted: No Moisture No Abnormalities Noted: No Wound Preparation Ulcer Cleansing: Rinsed/Irrigated with Saline Treatment Notes Wound #8 (Right, Lateral, Posterior Lower Leg) 1. Cleansed with: Cleanse wound with antibacterial soap and water 4. Dressing Applied: Prisma Ag 5. Secondary Dressing Applied ABD Pad 7. Secured with 3 Layer Compression System - Bilateral Electronic Signature(s) Signed: 01/18/2017 5:09:04 PM By: Gretta Cool, RN, BSN, Kim RN, BSN Entered By: Gretta Cool, RN, BSN, Kim on 01/18/2017 13:33:05 [name] ([id number]) -------------------------------------------------------------------------------- Calvin Details Patient Name: [name] Date of Service: 01/18/2017 12:30 PM Medical Record Patient Account Number: [id number] Number: Treating RN: Cornell Barman 02/08/1934 (83 y.o. Other  Clinician: Date of Birth/Sex: Female) Treating ROBSON, MICHAEL Primary Care Machel Violante: Emily Filbert Mikela Senn/Extender: G Referring Fianna Snowball: Melina Modena in Treatment: 2 Vital Signs Time Taken: 13:05 Temperature (F): 97.9 Height (in): 61 Pulse (bpm): 96 Weight (lbs): 206 Respiratory Rate (breaths/min): 16 Body Mass Index (BMI): 38.9 Blood Pressure (mmHg): 97/85 Reference Range: 80 - 120 mg / dl Electronic Signature(s) Signed: 01/18/2017 5:09:04 PM By: Gretta Cool, RN, BSN, Kim RN, BSN Entered By: Gretta Cool, RN, BSN, Kim on 01/18/2017 13:06:56

## 2017-01-21 ENCOUNTER — Ambulatory Visit: Payer: Medicare Other

## 2017-01-25 ENCOUNTER — Encounter: Payer: Medicare Other | Admitting: Internal Medicine

## 2017-01-25 DIAGNOSIS — E11621 Type 2 diabetes mellitus with foot ulcer: Secondary | ICD-10-CM | POA: Diagnosis not present

## 2017-01-26 NOTE — Progress Notes (Signed)
[name], [name] ([id number]) Visit Report for 01/25/2017 Arrival Information Details Patient Name: [name], [name] Date of Service: 01/25/2017 12:30 PM Medical Record Patient Account Number: [id number] Number: Treating RN: Ahmed Prima 1934/10/10 (83 y.o. Other Clinician: Date of Birth/Sex: Female) Treating ROBSON, [place] Primary Care Aurel Nguyen: Emily Filbert Iana Buzan/Extender: G Referring Doshia Dalia: Melina Modena in Treatment: 3 Visit Information History Since Last Visit All ordered tests and consults were completed: No Patient Arrived: Wheel Chair Added or deleted any medications: No Arrival Time: 12:35 Any new allergies or adverse reactions: No Accompanied By: husband Had a fall or experienced change in No activities of daily living that may affect Transfer Assistance: Harrel Lemon Lift risk of falls: Patient Identification Verified: Yes Signs or symptoms of abuse/neglect since last No Secondary Verification Process Yes visito Completed: Hospitalized since last visit: No Patient Requires Transmission-Based No Has Dressing in Place as Prescribed: Yes Precautions: Has Compression in Place as Prescribed: Yes Patient Has Alerts: Yes Pain Present Now: No Patient Alerts: DM II Electronic Signature(s) Signed: 01/25/2017 4:37:07 PM By: Alric Quan Entered By: Alric Quan on 01/25/2017 12:36:17 [name] ([id number]) -------------------------------------------------------------------------------- Encounter Discharge Information Details Patient Name: [name] Date of Service: 01/25/2017 12:30 PM Medical Record Patient Account Number: [id number] Number: Treating RN: Ahmed Prima 1934-09-21 (83 y.o. Other Clinician: Date of Birth/Sex: Female) Treating ROBSON, MICHAEL Primary Care Lynea Rollison: Emily Filbert Merelin Human/Extender: G Referring Ulyess Muto: Melina Modena in Treatment: 3 Encounter Discharge Information Items Discharge Pain Level:  0 Discharge Condition: Stable Ambulatory Status: Wheelchair Discharge Destination: Home Transportation: Private Auto Accompanied By: husband Schedule Follow-up Appointment: Yes Medication Reconciliation completed and provided to Patient/Care No Spiro Ausborn: Provided on Clinical Summary of Care: 01/25/2017 Form Type Recipient Paper Patient MB Electronic Signature(s) Signed: 01/25/2017 1:50:22 PM By: Ruthine Dose Entered By: Ruthine Dose on 01/25/2017 13:50:22 [name] ([id number]) -------------------------------------------------------------------------------- Lower Extremity Assessment Details Patient Name: [name] Date of Service: 01/25/2017 12:30 PM Medical Record Patient Account Number: [id number] Number: Treating RN: Ahmed Prima 1934/02/27 (83 y.o. Other Clinician: Date of Birth/Sex: Female) Treating ROBSON, [place] Primary Care Christie Viscomi: Emily Filbert Brylin Stopper/Extender: G Referring Sharmaine Bain: Melina Modena in Treatment: 3 Edema Assessment Assessed: [Left: No] [Right: No] E[Left: dema] [Right: :] Calf Left: Right: Point of Measurement: 31 cm From Medial Instep 42 cm 38 cm Ankle Left: Right: Point of Measurement: 8 cm From Medial Instep 20.6 cm 30 cm Vascular Assessment Pulses: Dorsalis Pedis Palpable: [Left:No] [Right:No] Posterior Tibial Extremity colors, hair growth, and conditions: Extremity Color: [Left:Mottled] [Right:Mottled] Temperature of Extremity: [Left:Warm] [Right:Warm] Capillary Refill: [Left:< 3 seconds] [Right:< 3 seconds] Electronic Signature(s) Signed: 01/25/2017 4:37:07 PM By: Alric Quan Entered By: Alric Quan on 01/25/2017 14:04:08 [name] ([id number]) -------------------------------------------------------------------------------- Multi Wound Chart Details Patient Name: [name] Date of Service: 01/25/2017 12:30 PM Medical Record Patient Account Number:  [id number] Number: Treating RN: Ahmed Prima 10/20/1934 (83 y.o. Other Clinician: Date of Birth/Sex: Female) Treating ROBSON, MICHAEL Primary Care Connery Shiffler: Emily Filbert Khristine Verno/Extender: G Referring Kreig Parson: Melina Modena in Treatment: 3 Vital Signs Height(in): 61 Pulse(bpm): 86 Weight(lbs): 206 Blood Pressure 102/58 (mmHg): Body Mass Index(BMI): 39 Temperature(F): 98.0 Respiratory Rate 16 (breaths/min): Photos: [1:No Photos] [2:No Photos] [3:No Photos] Wound Location: [1:Left Foot - Dorsal] [2:Right Foot - Dorsal] [3:Left Calcaneus] Wounding Event: [1:Gradually Appeared] [2:Gradually Appeared] [3:Pressure Injury] Primary Etiology: [1: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] [2:the Lower Extremity] [3:Pressure Ulcer] Comorbid History: [1:Sleep Apnea, Arrhythmia, Sleep Apnea, Arrhythmia, Hypertension, Cirrhosis ,  Hypertension, Cirrhosis , Type II Diabetes, End Stage Renal Disease, Gout, Osteoarthritis] [2:Type II Diabetes, End Stage Renal Disease, Gout,  Osteoarthritis] [3:Sleep Apnea, Arrhythmia, Hypertension, Cirrhosis , Type II Diabetes, End Stage Renal Disease, Gout, Osteoarthritis] Date Acquired: [1:11/04/2016] [2:11/04/2016] [3:11/04/2016] Weeks of Treatment: [1:3] [2:3] [3:3] Wound Status: [1:Open] [2:Open] [3:Open] Measurements L x W x D 4.5x6x0.6 [2:6.4x4x0.5] [3:3.5x8x0.5] (cm) Area (cm) : [1:21.206] [2:20.106] [3:21.991] Volume (cm) : [1:12.723] [2:10.053] [3:10.996] % Reduction in Area: [1:-50.00%] [2:-6.70%] [3:22.20%] % Reduction in Volume: -350.10% [2:-166.70%] [3:-289.00%] Classification: [1:Grade 1] [2:Grade 2] [3:Category/Stage II] HBO Classification: [1:N/A] [2:N/A] [3:Grade 1] Exudate Amount: [1:Medium] [2:Large] [3:Large] Exudate Type: [1:Serous] [2:Purulent] [3:Serous] Exudate Color: [1:amber] [2:yellow, brown, green] [3:amber] Wound Margin: [1:Well defined, not attached Distinct, outline attached]  [3:Distinct, outline attached] Granulation Amount: [1:None Present (0%)] [2:Small (1-33%)] [3:None Present (0%)] Granulation Quality: [1:N/A] [2:Pink] [3:N/A] Necrotic Amount: [1:Large (67-100%)] [2:Large (67-100%)] [3:Large (67-100%)] Necrotic Tissue: Eschar, Adherent Slough Eschar, Adherent Slough Eschar Exposed Structures: Fat Layer (Subcutaneous Fat Layer (Subcutaneous N/A Tissue) Exposed: Yes Tissue) Exposed: Yes Fascia: No Fascia: No Tendon: No Tendon: No Muscle: No Muscle: No Joint: No Joint: No Bone: No Bone: No Epithelialization: None None None Debridement: Debridement [id number] Debridement [id number] N/A [id number]) 11047) Pre-procedure 13:17 13:17 N/A Verification/Time Out Taken: Pain Control: Lidocaine 4% Topical Lidocaine 4% Topical N/A Solution Solution Tissue Debrided: Fibrin/Slough, Exudates, Fibrin/Slough, Exudates, N/A Subcutaneous Subcutaneous Level: Skin/Subcutaneous Skin/Subcutaneous N/A Tissue Tissue Debridement Area (sq 27 25.6 N/A cm): Instrument: Curette Curette N/A Bleeding: Minimum Minimum N/A Hemostasis Achieved: Pressure Pressure N/A Procedural Pain: 0 0 N/A Post Procedural Pain: 0 0 N/A Debridement Treatment Procedure was tolerated Procedure was tolerated N/A Response: well well Post Debridement 4.5x6x0.6 6.4x4x0.5 N/A Measurements L x W x D (cm) Post Debridement 12.723 10.053 N/A Volume: (cm) Periwound Skin Texture: Excoriation: Yes No Abnormalities Noted No Abnormalities Noted Induration: Yes Callus: Yes Crepitus: Yes Rash: Yes Scarring: Yes Periwound Skin Maceration: Yes Maceration: Yes No Abnormalities Noted Moisture: Dry/Scaly: Yes Periwound Skin Color: Atrophie Blanche: Yes Erythema: Yes Erythema: Yes Cyanosis: Yes Ecchymosis: Yes Erythema: Yes Hemosiderin Staining: Yes Mottled: Yes Pallor: Yes Rubor: Yes [name], Connie R. ([id number]) Erythema Location: N/A Circumferential Circumferential Temperature: N/A No Abnormality No  Abnormality Tenderness on No Yes Yes Palpation: Wound Preparation: Ulcer Cleansing: Other: Ulcer Cleansing: Ulcer Cleansing: soap and water Rinsed/Irrigated with Rinsed/Irrigated with Saline Saline Topical Anesthetic Applied: Other: lidocaine Topical Anesthetic Topical Anesthetic 4% Applied: Other: Applied: Other: LIDOCAINE 4% LIDOCAINE 4% Procedures Performed: Debridement Debridement N/A Wound Number: [id number]  Photos: No Photos No Photos No Photos Wound Location: Right Calcaneus Right Lower Leg - Anterior Right Lower Leg - Lateral, Posterior Wounding Event: Pressure Injury Gradually Appeared Gradually Appeared Primary Etiology: Pressure Ulcer 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 the Lower Extremity Comorbid History: Sleep Apnea, Arrhythmia, Sleep Apnea, Arrhythmia, Sleep Apnea, Arrhythmia, Hypertension, Cirrhosis , Hypertension, Cirrhosis , Hypertension, Cirrhosis , Type II Diabetes, End Type II Diabetes, End Type II Diabetes, End Stage Renal Disease, Stage Renal Disease, Stage Renal Disease, Gout, Osteoarthritis Gout, Osteoarthritis Gout, Osteoarthritis Date Acquired: 11/04/2016 11/04/2016 11/04/2016 Weeks of Treatment: 3 3 3  Wound Status: Open Open Open Measurements L x W x D 4.6x2.5x0.4 0x0x0 5x2x2 (cm) Area (cm) : 9.032 0 7.854 Volume (cm) : 3.613 0 15.708 % Reduction in Area: 54.00% 100.00% 23.10% % Reduction in Volume: 8.00% 100.00% -2.60% Classification: Category/Stage II Grade 1 Grade 2 HBO Classification: Grade 1 N/A N/A Exudate Amount: Large None Present Large Exudate Type: Purulent N/A  Serosanguineous Exudate Color: yellow, brown, green N/A red, brown Wound Margin: Distinct, outline attached Distinct, outline attached Distinct, outline attached Granulation Amount: None Present (0%) None Present (0%) None Present (0%) Granulation Quality: N/A N/A N/A Necrotic Amount: Large (67-100%) None Present (0%) Large (67-100%) Necrotic  Tissue: Eschar, Adherent [name] Exposed Structures: N/A Fascia: No N/A Fat Layer (Subcutaneous Tissue) Exposed: No Tendon: No Muscle: No [name], Connie R. ([id number]) Joint: No Bone: No Limited to Skin Breakdown Epithelialization: None Large (67-100%) None Debridement: N/A N/A N/A Pain Control: N/A N/A N/A Tissue Debrided: N/A N/A N/A Level: N/A N/A N/A Debridement Area (sq N/A N/A N/A cm): Instrument: N/A N/A N/A Bleeding: N/A N/A N/A Hemostasis Achieved: N/A N/A N/A Procedural Pain: N/A N/A N/A Post Procedural Pain: N/A N/A N/A Debridement Treatment N/A N/A N/A Response: Post Debridement N/A N/A N/A Measurements L x W x D (cm) Post Debridement N/A N/A N/A Volume: (cm) Periwound Skin Texture: No Abnormalities Noted No Abnormalities Noted No Abnormalities Noted Periwound Skin No Abnormalities Noted No Abnormalities Noted Maceration: Yes Moisture: Periwound Skin Color: Erythema: Yes No Abnormalities Noted Erythema: Yes Erythema Location: Circumferential N/A Circumferential Temperature: No Abnormality No Abnormality No Abnormality Tenderness on Yes Yes Yes Palpation: Wound Preparation: Ulcer Cleansing: Ulcer Cleansing: Ulcer Cleansing: Rinsed/Irrigated with Rinsed/Irrigated with Rinsed/Irrigated with Saline Saline Saline, Other: soap and water Topical Anesthetic Topical Anesthetic Applied: Other: Applied: None Topical Anesthetic LIDOCAINE 4% Applied: Other: LIDOCAINE 4% Procedures Performed: N/A N/A N/A Wound Number: [id number] N/A Photos: No Photos No Photos N/A Wound Location: Right Malleolus - Lateral Left Lower Leg - Lateral, N/A Posterior Wounding Event: Gradually Appeared Gradually Appeared N/A 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 Comorbid History: N/A [name], [name] ([id number]) Sleep Apnea, Arrhythmia, Sleep Apnea, Arrhythmia, Hypertension, Cirrhosis , Hypertension, Cirrhosis , Type II  Diabetes, End Type II Diabetes, End Stage Renal Disease, Stage Renal Disease, Gout, Osteoarthritis Gout, Osteoarthritis Date Acquired: 01/11/2017 01/10/2017 N/A Weeks of Treatment: 2 1 N/A Wound Status: Open Open N/A Measurements L x W x D 0.7x0.8x0.3 0.3x0.5x0.1 N/A (cm) Area (cm) : 0.44 0.118 N/A Volume (cm) : 0.132 0.012 N/A % Reduction in Area: 12.50% 37.20% N/A % Reduction in Volume: 12.60% 36.80% N/A Classification: Grade 2 Partial Thickness N/A HBO Classification: N/A Grade 1 N/A Exudate Amount: Large Large N/A Exudate Type: Serous Serosanguineous N/A Exudate Color: amber red, brown N/A Wound Margin: Distinct, outline attached Distinct, outline attached N/A Granulation Amount: None Present (0%) Large (67-100%) N/A Granulation Quality: N/A Pink N/A Necrotic Amount: Large (67-100%) None Present (0%) N/A Necrotic Tissue: Adherent Slough N/A N/A Exposed Structures: N/A Fascia: No N/A Fat Layer (Subcutaneous Tissue) Exposed: No Tendon: No Muscle: No Joint: No Bone: No Limited to Skin Breakdown Epithelialization: None None N/A Debridement: N/A N/A N/A Pain Control: N/A N/A N/A Tissue Debrided: N/A N/A N/A Level: N/A N/A N/A Debridement Area (sq N/A N/A N/A cm): Instrument: N/A N/A N/A Bleeding: N/A N/A N/A Hemostasis Achieved: N/A N/A N/A Procedural Pain: N/A N/A N/A Post Procedural Pain: N/A N/A N/A Debridement Treatment N/A N/A N/A Response: N/A N/A N/A [name], Connie R. ([id number]) Post Debridement Measurements L x W x D (cm) Post Debridement N/A N/A N/A Volume: (cm) Periwound Skin Texture: No Abnormalities Noted No Abnormalities Noted N/A Periwound Skin No Abnormalities Noted No Abnormalities Noted N/A Moisture: Periwound Skin Color: No Abnormalities Noted No Abnormalities Noted N/A Erythema Location: N/A N/A N/A Temperature: N/A No Abnormality N/A Tenderness on No  Yes N/A Palpation: Wound Preparation: Ulcer Cleansing: Ulcer Cleansing:  N/A Rinsed/Irrigated with Rinsed/Irrigated with Saline Saline Topical Anesthetic Topical Anesthetic Applied: Other: lidocaine Applied: Other: lidocaine 4% 4% Procedures Performed: N/A N/A N/A Treatment Notes Wound #1 (Left, Dorsal Foot) 1. Cleansed with: Clean wound with Normal Saline Cleanse wound with antibacterial soap and water 4. Dressing Applied: Iodoflex 5. Secondary Dressing Applied ABD Pad 7. Secured with Tape 3 Layer Compression System - Bilateral Wound #2 (Right, Dorsal Foot) 1. Cleansed with: Clean wound with Normal Saline Cleanse wound with antibacterial soap and water 4. Dressing Applied: Iodoflex 5. Secondary Dressing Applied ABD Pad 7. Secured with Tape 3 Layer Compression System - Bilateral Wound #3 (Left Calcaneus) [name], Connie R. ([id number]) 1. Cleansed with: Clean wound with Normal Saline Cleanse wound with antibacterial soap and water 2. Anesthetic Topical Lidocaine 4% cream to wound bed prior to debridement 4. Dressing Applied: Medihoney Gel 5. Secondary Dressing Applied ABD Pad 7. Secured with Tape 3 Layer Compression System - Bilateral Notes heel cups Wound #4 (Right Calcaneus) 1. Cleansed with: Clean wound with Normal Saline Cleanse wound with antibacterial soap and water 2. Anesthetic Topical Lidocaine 4% cream to wound bed prior to debridement 4. Dressing Applied: Medihoney Gel 5. Secondary Dressing Applied ABD Pad 7. Secured with Tape 3 Layer Compression System - Bilateral Notes heel cups Wound #6 (Right, Lateral, Posterior Lower Leg) 1. Cleansed with: Clean wound with Normal Saline Cleanse wound with antibacterial soap and water 2. Anesthetic Topical Lidocaine 4% cream to wound bed prior to debridement 4. Dressing Applied: Prisma Ag 5. Secondary Dressing Applied ABD Pad 7. Secured with Tape 3 Layer Compression System - Left Lower Extremity Wound #7 (Right, Lateral Malleolus) [name], Smt. R. ([id number]) 1. Cleansed  with: Clean wound with Normal Saline Cleanse wound with antibacterial soap and water 2. Anesthetic Topical Lidocaine 4% cream to wound bed prior to debridement 4. Dressing Applied: Prisma Ag 5. Secondary Dressing Applied ABD Pad 7. Secured with Tape 3 Layer Compression System - Left Lower Extremity Wound #8 (Left, Lateral, Posterior Lower Leg) 1. Cleansed with: Clean wound with Normal Saline Cleanse wound with antibacterial soap and water 2. Anesthetic Topical Lidocaine 4% cream to wound bed prior to debridement 4. Dressing Applied: Prisma Ag 5. Secondary Dressing Applied ABD Pad 7. Secured with Tape 3 Layer Compression System - Left Lower Extremity Electronic Signature(s) Signed: 01/25/2017 4:37:07 PM By: Alric Quan Entered By: Alric Quan on 01/25/2017 14:21:40 [name] ([id number]) -------------------------------------------------------------------------------- [place] Details Patient Name: [name] Date of Service: 01/25/2017 12:30 PM Medical Record Patient Account Number: [id number] Number: Treating RN: Ahmed Prima 1934/05/24 (83 y.o. Other Clinician: Date of Birth/Sex: Female) Treating ROBSON, MICHAEL Primary Care Jorden Minchey: Emily Filbert Dhrithi Riche/Extender: G Referring Emilyann Banka: Melina Modena in Treatment: 3 Active Inactive ` Abuse / Safety / Falls / Self Care Management Nursing Diagnoses: Potential for falls Goals: Patient will remain injury free Date Initiated: 01/04/2017 Target Resolution Date: 02/24/2017 Goal Status: Active Interventions: Assess fall risk on admission and as needed Assess self care needs on admission and as needed Notes: ` Nutrition Nursing Diagnoses: Imbalanced nutrition Impaired glucose control: actual or potential Potential for alteratiion in Nutrition/Potential for imbalanced nutrition Goals: Patient/caregiver agrees to and verbalizes understanding of need to use  nutritional supplements and/or vitamins as prescribed Date Initiated: 01/04/2017 Target Resolution Date: 02/24/2017 Goal Status: Active Interventions: Assess patient nutrition upon admission and as needed per policy Notes: LESLIE, DIMEO ([id number]) Orientation to the Wound  Care Program Nursing Diagnoses: Knowledge deficit related to the wound healing center program Goals: Patient/caregiver will verbalize understanding of the [place] Program Date Initiated: 01/04/2017 Target Resolution Date: 02/24/2017 Goal Status: Active Interventions: Provide education on orientation to the wound center Notes: ` Pain, Acute or Chronic Nursing Diagnoses: Pain, acute or chronic: actual or potential Potential alteration in comfort, pain Goals: Patient will verbalize adequate pain control and receive pain control interventions during procedures as needed Date Initiated: 01/04/2017 Target Resolution Date: 02/24/2017 Goal Status: Active Interventions: Assess comfort goal upon admission Complete pain assessment as per visit requirements Notes: ` Wound/Skin Impairment Nursing Diagnoses: Impaired tissue integrity Goals: Ulcer/skin breakdown will have a volume reduction of 80% by week 12 Date Initiated: 01/04/2017 Target Resolution Date: 02/24/2017 Goal Status: Active Interventions: Assess patient/caregiver ability to perform ulcer/skin care regimen upon admission and as needed BARBAR, LABELL ([id number]) Notes: Electronic Signature(s) Signed: 01/25/2017 4:37:07 PM By: Alric Quan Entered By: Alric Quan on 01/25/2017 14:04:13 [name] ([id number]) -------------------------------------------------------------------------------- Pain Assessment Details Patient Name: [name] Date of Service: 01/25/2017 12:30 PM Medical Record Patient Account Number: [id number] Number: Treating RN: Ahmed Prima [birth year] (83 y.o. Other Clinician: Date of  Birth/Sex: Female) Treating ROBSON, MICHAEL Primary Care Leeland Lovelady: Emily Filbert Kyren Vaux/Extender: G Referring Donovyn Guidice: Melina Modena in Treatment: 3 Active Problems Location of Pain Severity and Description of Pain Patient Has Paino No Site Locations With Dressing Change: No Pain Management and Medication Current Pain Management: Electronic Signature(s) Signed: 01/25/2017 4:37:07 PM By: Alric Quan Entered By: Alric Quan on 01/25/2017 12:36:25 [name] ([id number]) -------------------------------------------------------------------------------- Patient/Caregiver Education Details Patient Name: [name] Date of Service: 01/25/2017 12:30 PM Medical Record Patient Account Number: [id number] Number: Treating RN: Ahmed Prima Jul 22, 1934 (83 y.o. Other Clinician: Date of Birth/Gender: Female) Treating ROBSON, [place] Primary Care Physician: Emily Filbert Physician/Extender: G Referring Physician: Melina Modena in Treatment: 3 Education Assessment Education Provided To: Patient Education Topics Provided Wound/Skin Impairment: Handouts: Other: change dressing as ordered Methods: Demonstration, Explain/Verbal Responses: State content correctly Electronic Signature(s) Signed: 01/25/2017 4:37:07 PM By: Alric Quan Entered By: Alric Quan on 01/25/2017 13:12:18 [name] ([id number]) -------------------------------------------------------------------------------- Wound Assessment Details Patient Name: [name] Date of Service: 01/25/2017 12:30 PM Medical Record Patient Account Number: [id number] Number: Treating RN: Ahmed Prima 1934-08-01 (83 y.o. Other Clinician: Date of Birth/Sex: Female) Treating ROBSON, [place] Primary Care Shyhiem Beeney: Emily Filbert Lajoya Dombek/Extender: G Referring Cambelle Suchecki: Melina Modena in Treatment: 3 Wound Status Wound Number: 1 Primary Diabetic Wound/Ulcer of the  Lower Etiology: Extremity Wound Location: Left Foot - Dorsal Wound Open Wounding Event: Gradually Appeared Status: Date Acquired: 11/04/2016 Comorbid Sleep Apnea, Arrhythmia, Hypertension, Weeks Of Treatment: 3 History: Cirrhosis , Type II Diabetes, End Stage Clustered Wound: No Renal Disease, Gout, Osteoarthritis Photos Photo Uploaded By: Alric Quan on 01/25/2017 14:57:13 Wound Measurements Length: (cm) 4.5 Width: (cm) 6 Depth: (cm) 0.6 Area: (cm) 21.206 Volume: (cm) 12.723 % Reduction in Area: -50% % Reduction in Volume: -350.1% Epithelialization: None Tunneling: No Undermining: No Wound Description Classification: Grade 1 Foul Odor Afte Wound Margin: Well defined, not attached Slough/Fibrino Exudate Amount: Medium Exudate Type: Serous Exudate Color: amber r Cleansing: No Yes Wound Bed Granulation Amount: None Present (0%) Exposed Structure Necrotic Amount: Large (67-100%) Fascia Exposed: No [name], Connie R. ([id number]) Necrotic Quality: Eschar, Adherent Slough Fat Layer (Subcutaneous Tissue) Exposed: Yes Tendon Exposed: No Muscle Exposed: No Joint Exposed: No Bone Exposed: No Periwound Skin Texture Texture Color No  Abnormalities Noted: No No Abnormalities Noted: No Callus: Yes Atrophie Blanche: Yes Crepitus: Yes Cyanosis: Yes Excoriation: Yes Ecchymosis: Yes Induration: Yes Erythema: Yes Rash: Yes Hemosiderin Staining: Yes Scarring: Yes Mottled: Yes Pallor: Yes Moisture Rubor: Yes No Abnormalities Noted: No Dry / Scaly: Yes Maceration: Yes Wound Preparation Ulcer Cleansing: Other: soap and water, Topical Anesthetic Applied: Other: lidocaine 4%, Treatment Notes Wound #1 (Left, Dorsal Foot) 1. Cleansed with: Clean wound with Normal Saline Cleanse wound with antibacterial soap and water 4. Dressing Applied: Iodoflex 5. Secondary Dressing Applied ABD Pad 7. Secured with Tape 3 Layer Compression System - Bilateral Electronic  Signature(s) Signed: 01/25/2017 4:37:07 PM By: Alric Quan Entered By: Alric Quan on 01/25/2017 13:09:40 [name] ([id number]) -------------------------------------------------------------------------------- Wound Assessment Details Patient Name: [name] Date of Service: 01/25/2017 12:30 PM Medical Record Patient Account Number: [id number] Number: Treating RN: Ahmed Prima 09-10-1934 (83 y.o. Other Clinician: Date of Birth/Sex: Female) Treating ROBSON, [place] Primary Care Jozi Malachi: Emily Filbert Trevionne Advani/Extender: G Referring Josephyne Tarter: Melina Modena in Treatment: 3 Wound Status Wound Number: 2 Primary Diabetic Wound/Ulcer of the Lower Etiology: Extremity Wound Location: Right Foot - Dorsal Wound Open Wounding Event: Gradually Appeared Status: Date Acquired: 11/04/2016 Comorbid Sleep Apnea, Arrhythmia, Hypertension, Weeks Of Treatment: 3 History: Cirrhosis , Type II Diabetes, End Stage Clustered Wound: No Renal Disease, Gout, Osteoarthritis Photos Photo Uploaded By: Alric Quan on 01/25/2017 14:57:49 Wound Measurements Length: (cm) 6.4 Width: (cm) 4 Depth: (cm) 0.5 Area: (cm) 20.106 Volume: (cm) 10.053 % Reduction in Area: -6.7% % Reduction in Volume: -166.7% Epithelialization: None Tunneling: No Undermining: No Wound Description Classification: Grade 2 Foul Odor Aft Wound Margin: Distinct, outline attached Slough/Fibrin Exudate Amount: Large Exudate Type: Purulent Exudate Color: yellow, brown, green er Cleansing: No o Yes Wound Bed Granulation Amount: Small (1-33%) Exposed Structure Granulation Quality: Pink Fascia Exposed: No [name], Connie R. ([id number]) Necrotic Amount: Large (67-100%) Fat Layer (Subcutaneous Tissue) Exposed: Yes Necrotic Quality: Eschar, Adherent Slough Tendon Exposed: No Muscle Exposed: No Joint Exposed: No Bone Exposed: No Periwound Skin Texture Texture Color No Abnormalities Noted:  No No Abnormalities Noted: No Erythema: Yes Moisture Erythema Location: Circumferential No Abnormalities Noted: No Maceration: Yes Temperature / Pain Temperature: No Abnormality Tenderness on Palpation: Yes Wound Preparation Ulcer Cleansing: Rinsed/Irrigated with Saline Topical Anesthetic Applied: Other: LIDOCAINE 4%, Treatment Notes Wound #2 (Right, Dorsal Foot) 1. Cleansed with: Clean wound with Normal Saline Cleanse wound with antibacterial soap and water 4. Dressing Applied: Iodoflex 5. Secondary Dressing Applied ABD Pad 7. Secured with Tape 3 Layer Compression System - Bilateral Electronic Signature(s) Signed: 01/25/2017 4:37:07 PM By: Alric Quan Entered By: Alric Quan on 01/25/2017 13:07:11 [name] ([id number]) -------------------------------------------------------------------------------- Wound Assessment Details Patient Name: [name] Date of Service: 01/25/2017 12:30 PM Medical Record Patient Account Number: [id number] Number: Treating RN: Ahmed Prima 10/03/1934 (83 y.o. Other Clinician: Date of Birth/Sex: Female) Treating ROBSON, [place] Primary Care Prestyn Mahn: Emily Filbert Jayden Kratochvil/Extender: G Referring Mitsuru Dault: Melina Modena in Treatment: 3 Wound Status Wound Number: 3 Primary Pressure Ulcer Etiology: Wound Location: Left Calcaneus Wound Open Wounding Event: Pressure Injury Status: Date Acquired: 11/04/2016 Comorbid Sleep Apnea, Arrhythmia, Hypertension, Weeks Of Treatment: 3 History: Cirrhosis , Type II Diabetes, End Stage Clustered Wound: No Renal Disease, Gout, Osteoarthritis Photos Photo Uploaded By: Alric Quan on 01/25/2017 14:57:49 Wound Measurements Length: (cm) 3.5 Width: (cm) 8 Depth: (cm) 0.5 Area: (cm) 21.991 Volume: (cm) 10.996 % Reduction in Area: 22.2% % Reduction in Volume: -289% Epithelialization: None Tunneling:  No Undermining: No Wound Description Classification:  Category/Stage II Foul O Diabetic Severity (Wagner): Grade 1 Slough Wound Margin: Distinct, outline attached Exudate Amount: Large Exudate Type: Serous Exudate Color: amber dor After Cleansing: No /Fibrino No Wound Bed Granulation Amount: None Present (0%) Lazalde, Kylinn R. ([id number]) Necrotic Amount: Large (67-100%) Necrotic Quality: Eschar Periwound Skin Texture Texture Color No Abnormalities Noted: No No Abnormalities Noted: No Erythema: Yes Moisture Erythema Location: Circumferential No Abnormalities Noted: No Temperature / Pain Temperature: No Abnormality Tenderness on Palpation: Yes Wound Preparation Ulcer Cleansing: Rinsed/Irrigated with Saline Topical Anesthetic Applied: Other: LIDOCAINE 4%, Treatment Notes Wound #3 (Left Calcaneus) 1. Cleansed with: Clean wound with Normal Saline Cleanse wound with antibacterial soap and water 2. Anesthetic Topical Lidocaine 4% cream to wound bed prior to debridement 4. Dressing Applied: Medihoney Gel 5. Secondary Dressing Applied ABD Pad 7. Secured with Tape 3 Layer Compression System - Bilateral Notes heel cups Electronic Signature(s) Signed: 01/25/2017 4:37:07 PM By: Alric Quan Entered By: Alric Quan on 01/25/2017 13:06:09 [name] ([id number]) -------------------------------------------------------------------------------- Wound Assessment Details Patient Name: [name] Date of Service: 01/25/2017 12:30 PM Medical Record Patient Account Number: [id number] Number: Treating RN: Ahmed Prima 02/24/1934 (83 y.o. Other Clinician: Date of Birth/Sex: Female) Treating ROBSON, [place] Primary Care Cortney Beissel: Emily Filbert Shaleena Crusoe/Extender: G Referring Wanetta Funderburke: Melina Modena in Treatment: 3 Wound Status Wound Number: 4 Primary Pressure Ulcer Etiology: Wound Location: Right Calcaneus Wound Open Wounding Event: Pressure Injury Status: Date Acquired: 11/04/2016 Comorbid Sleep  Apnea, Arrhythmia, Hypertension, Weeks Of Treatment: 3 History: Cirrhosis , Type II Diabetes, End Stage Clustered Wound: No Renal Disease, Gout, Osteoarthritis Photos Photo Uploaded By: Alric Quan on 01/25/2017 14:58:28 Wound Measurements Length: (cm) 4.6 Width: (cm) 2.5 Depth: (cm) 0.4 Area: (cm) 9.032 Volume: (cm) 3.613 % Reduction in Area: 54% % Reduction in Volume: 8% Epithelialization: None Tunneling: No Undermining: No Wound Description Classification: Category/Stage II Foul O Diabetic Severity (Wagner): Grade 1 Slough Wound Margin: Distinct, outline attached Exudate Amount: Large Exudate Type: Purulent Exudate Color: yellow, brown, green dor After Cleansing: No /Fibrino No Wound Bed Granulation Amount: None Present (0%) Mochizuki, Iona R. ([id number]) Necrotic Amount: Large (67-100%) Necrotic Quality: Eschar, Adherent Slough Periwound Skin Texture Texture Color No Abnormalities Noted: No No Abnormalities Noted: No Erythema: Yes Moisture Erythema Location: Circumferential No Abnormalities Noted: No Temperature / Pain Temperature: No Abnormality Tenderness on Palpation: Yes Wound Preparation Ulcer Cleansing: Rinsed/Irrigated with Saline Topical Anesthetic Applied: Other: LIDOCAINE 4%, Treatment Notes Wound #4 (Right Calcaneus) 1. Cleansed with: Clean wound with Normal Saline Cleanse wound with antibacterial soap and water 2. Anesthetic Topical Lidocaine 4% cream to wound bed prior to debridement 4. Dressing Applied: Medihoney Gel 5. Secondary Dressing Applied ABD Pad 7. Secured with Tape 3 Layer Compression System - Bilateral Notes heel cups Electronic Signature(s) Signed: 01/25/2017 4:37:07 PM By: Alric Quan Entered By: Alric Quan on 01/25/2017 13:05:47 [name] ([id number]) -------------------------------------------------------------------------------- Wound Assessment Details Patient Name: [name] Date of  Service: 01/25/2017 12:30 PM Medical Record Patient Account Number: [id number] Number: Treating RN: Ahmed Prima 02-Nov-1934 (83 y.o. Other Clinician: Date of Birth/Sex: Female) Treating ROBSON, [place] Primary Care Zaylynn Rickett: Emily Filbert Jode Lippe/Extender: G Referring Donn Zanetti: Melina Modena in Treatment: 3 Wound Status Wound Number: 5 Primary Diabetic Wound/Ulcer of the Lower Etiology: Extremity Wound Location: Right Lower Leg - Anterior Wound Open Wounding Event: Gradually Appeared Status: Date Acquired: 11/04/2016 Comorbid Sleep Apnea, Arrhythmia, Hypertension, Weeks Of Treatment: 3 History: Cirrhosis , Type II Diabetes,  End Stage Clustered Wound: No Renal Disease, Gout, Osteoarthritis Photos Photo Uploaded By: Alric Quan on 01/25/2017 14:58:29 Wound Measurements Length: (cm) 0 % Reductio Width: (cm) 0 % Reductio Depth: (cm) 0 Epithelial Area: (cm) 0 Tunneling Volume: (cm) 0 Undermini n in Area: 100% n in Volume: 100% ization: Large (67-100%) : No ng: No Wound Description Classification: Grade 1 Wound Margin: Distinct, outline attached Exudate Amount: None Present Foul Odor After Cleansing: No Slough/Fibrino No Wound Bed Granulation Amount: None Present (0%) Exposed Structure Necrotic Amount: None Present (0%) Fascia Exposed: No Fat Layer (Subcutaneous Tissue) Exposed: No Tendon Exposed: No Fencl, Jetty R. ([id number]) Muscle Exposed: No Joint Exposed: No Bone Exposed: No Limited to Skin Breakdown Periwound Skin Texture Texture Color No Abnormalities Noted: No No Abnormalities Noted: No Moisture Temperature / Pain No Abnormalities Noted: No Temperature: No Abnormality Tenderness on Palpation: Yes Wound Preparation Ulcer Cleansing: Rinsed/Irrigated with Saline Topical Anesthetic Applied: None Electronic Signature(s) Signed: 01/25/2017 4:37:07 PM By: Alric Quan Entered By: Alric Quan on 01/25/2017 13:04:24 [name] ([id number]) -------------------------------------------------------------------------------- Wound Assessment Details Patient Name: [name] Date of Service: 01/25/2017 12:30 PM Medical Record Patient Account Number: [id number] Number: Treating RN: Ahmed Prima 16-Aug-1934 (83 y.o. Other Clinician: Date of Birth/Sex: Female) Treating ROBSON, [place] Primary Care Akaylah Lalley: Emily Filbert Wynne Jury/Extender: G Referring Orel Cooler: Melina Modena in Treatment: 3 Wound Status Wound Number: 6 Primary Diabetic Wound/Ulcer of the Lower Etiology: Extremity Wound Location: Right Lower Leg - Lateral, Posterior Wound Open Status: Wounding Event: Gradually Appeared Comorbid Sleep Apnea, Arrhythmia, Hypertension, Date Acquired: 11/04/2016 History: Cirrhosis , Type II Diabetes, End Stage Weeks Of Treatment: 3 Renal Disease, Gout, Osteoarthritis Clustered Wound: No Photos Photo Uploaded By: Alric Quan on 01/25/2017 14:59:10 Wound Measurements Length: (cm) 5 Width: (cm) 2 Depth: (cm) 2 Area: (cm) 7.854 Volume: (cm) 15.708 % Reduction in Area: 23.1% % Reduction in Volume: -2.6% Epithelialization: None Tunneling: No Undermining: No Wound Description Classification: Grade 2 Foul Odor Afte Wound Margin: Distinct, outline attached Slough/Fibrino Exudate Amount: Large Exudate Type: Serosanguineous Exudate Color: red, brown r Cleansing: No No Wound Bed Granulation Amount: None Present (0%) Necrotic Amount: Large (67-100%) Semper, Lyn R. ([id number]) Necrotic Quality: Adherent Slough Periwound Skin Texture Texture Color No Abnormalities Noted: No No Abnormalities Noted: No Erythema: Yes Moisture Erythema Location: Circumferential No Abnormalities Noted: No Maceration: Yes Temperature / Pain Temperature: No Abnormality Tenderness on Palpation: Yes Wound Preparation Ulcer Cleansing: Rinsed/Irrigated with Saline, Other: soap and water, Topical  Anesthetic Applied: Other: LIDOCAINE 4%, Treatment Notes Wound #6 (Right, Lateral, Posterior Lower Leg) 1. Cleansed with: Clean wound with Normal Saline Cleanse wound with antibacterial soap and water 2. Anesthetic Topical Lidocaine 4% cream to wound bed prior to debridement 4. Dressing Applied: Prisma Ag 5. Secondary Dressing Applied ABD Pad 7. Secured with Tape 3 Layer Compression System - Left Lower Extremity Electronic Signature(s) Signed: 01/25/2017 4:37:07 PM By: Alric Quan Entered By: Alric Quan on 01/25/2017 13:02:37 [name] ([id number]) -------------------------------------------------------------------------------- Wound Assessment Details Patient Name: [name] Date of Service: 01/25/2017 12:30 PM Medical Record Patient Account Number: [id number] Number: Treating RN: Ahmed Prima [birth year] (83 y.o. Other Clinician: Date of Birth/Sex: Female) Treating ROBSON, MICHA[place] Primary Care Evelynn Hench: Emily Filbert Paisely Brick/Extender: G Referring Nikaela Coyne: Melina Modena in Treatment: 3 Wound Status Wound Number: 7 Primary Diabetic Wound/Ulcer of the Lower Etiology: Extremity Wound Location: Right Malleolus - Lateral Wound Open Wounding Event: Gradually Appeared Status: Date Acquired: 01/11/2017 Comorbid Sleep Apnea, Arrhythmia, Hypertension, Weeks  Of Treatment: 2 History: Cirrhosis , Type II Diabetes, End Stage Clustered Wound: No Renal Disease, Gout, Osteoarthritis Photos Photo Uploaded By: Alric Quan on 01/25/2017 14:59:10 Wound Measurements Length: (cm) 0.7 Width: (cm) 0.8 Depth: (cm) 0.3 Area: (cm) 0.44 Volume: (cm) 0.132 % Reduction in Area: 12.5% % Reduction in Volume: 12.6% Epithelialization: None Tunneling: No Undermining: No Wound Description Classification: Grade 2 Foul Odor Afte Wound Margin: Distinct, outline attached Slough/Fibrino Exudate Amount: Large Exudate Type: Serous Exudate Color:  amber r Cleansing: No No Wound Bed Granulation Amount: None Present (0%) Necrotic Amount: Large (67-100%) Yuan, Verna R. ([id number]) Necrotic Quality: Adherent Slough Periwound Skin Texture Texture Color No Abnormalities Noted: No No Abnormalities Noted: No Moisture No Abnormalities Noted: No Wound Preparation Ulcer Cleansing: Rinsed/Irrigated with Saline Topical Anesthetic Applied: Other: lidocaine 4%, Treatment Notes Wound #7 (Right, Lateral Malleolus) 1. Cleansed with: Clean wound with Normal Saline Cleanse wound with antibacterial soap and water 2. Anesthetic Topical Lidocaine 4% cream to wound bed prior to debridement 4. Dressing Applied: Prisma Ag 5. Secondary Dressing Applied ABD Pad 7. Secured with Tape 3 Layer Compression System - Left Lower Extremity Electronic Signature(s) Signed: 01/25/2017 4:37:07 PM By: Alric Quan Entered By: Alric Quan on 01/25/2017 13:00:48 [name] ([id number]) -------------------------------------------------------------------------------- Wound Assessment Details Patient Name: [name] Date of Service: 01/25/2017 12:30 PM Medical Record Patient Account Number: [id number] Number: Treating RN: Ahmed Prima March 28, 1934 (83 y.o. Other Clinician: Date of Birth/Sex: Female) Treating ROBSON, [place] Primary Care Arlando Leisinger: Emily Filbert Berline Semrad/Extender: G Referring Amisha Pospisil: Melina Modena in Treatment: 3 Wound Status Wound Number: 8 Primary Venous Leg Ulcer Etiology: Wound Location: Left Lower Leg - Lateral, Posterior Wound Open Status: Wounding Event: Gradually Appeared Comorbid Sleep Apnea, Arrhythmia, Hypertension, Date Acquired: 01/10/2017 History: Cirrhosis , Type II Diabetes, End Stage Weeks Of Treatment: 1 Renal Disease, Gout, Osteoarthritis Clustered Wound: No Photos Photo Uploaded By: Alric Quan on 01/25/2017 14:59:48 Wound Measurements Length: (cm) 0.3 Width: (cm)  0.5 Depth: (cm) 0.1 Area: (cm) 0.118 Volume: (cm) 0.012 % Reduction in Area: 37.2% % Reduction in Volume: 36.8% Epithelialization: None Tunneling: No Undermining: No Wound Description Classification: Partial Thickness Foul O Diabetic Severity (Wagner): Grade 1 Slough Wound Margin: Distinct, outline attached Exudate Amount: Large Exudate Type: Serosanguineous Exudate Color: red, brown dor After Cleansing: No /Fibrino No Wound Bed Granulation Amount: Large (67-100%) Exposed Structure [place], Azrielle R. ([id number]) Granulation Quality: Pink Fascia Exposed: No Necrotic Amount: None Present (0%) Fat Layer (Subcutaneous Tissue) Exposed: No Tendon Exposed: No Muscle Exposed: No Joint Exposed: No Bone Exposed: No Limited to Skin Breakdown Periwound Skin Texture Texture Color No Abnormalities Noted: No No Abnormalities Noted: No Moisture Temperature / Pain No Abnormalities Noted: No Temperature: No Abnormality Tenderness on Palpation: Yes Wound Preparation Ulcer Cleansing: Rinsed/Irrigated with Saline Topical Anesthetic Applied: Other: lidocaine 4%, Treatment Notes Wound #8 (Left, Lateral, Posterior Lower Leg) 1. Cleansed with: Clean wound with Normal Saline Cleanse wound with antibacterial soap and water 2. Anesthetic Topical Lidocaine 4% cream to wound bed prior to debridement 4. Dressing Applied: Prisma Ag 5. Secondary Dressing Applied ABD Pad 7. Secured with Tape 3 Layer Compression System - Left Lower Extremity Electronic Signature(s) Signed: 01/25/2017 4:37:07 PM By: Alric Quan Entered By: Alric Quan on 01/25/2017 12:59:08 [name] ([id number]) -------------------------------------------------------------------------------- [place] Details Patient Name: [name] Date of Service: 01/25/2017 12:30 PM Medical Record Patient Account Number: [id number] Number: Treating RN: Ahmed Prima Dec 15, 1933 (83 y.o. Other  Clinician: Date of Birth/Sex: Female) Treating  [place] Primary Care Kimla Furth: Emily Filbert Tonette Koehne/Extender: G Referring Navie Lamoreaux: Melina Modena in Treatment: 3 Vital Signs Time Taken: 12:36 Temperature (F): 98.0 Height (in): 61 Pulse (bpm): 86 Weight (lbs): 206 Respiratory Rate (breaths/min): 16 Body Mass Index (BMI): 38.9 Blood Pressure (mmHg): 102/58 Reference Range: 80 - 120 mg / dl Electronic Signature(s) Signed: 01/25/2017 4:37:07 PM By: Alric Quan Entered By: Alric Quan on 01/25/2017 12:38:44

## 2017-01-26 NOTE — Progress Notes (Signed)
MALIA, BEM ([id number]) Visit Report for 01/25/2017 Chief Complaint Document Details Patient Name: [name], [name] Date of Service: 01/25/2017 12:30 PM Medical Record Patient Account Number: [id number] Number: Treating RN: Ahmed Prima [birth year] (83 y.o. Other Clinician: Date of Birth/Sex: Female) Treating ROBSON, MICHAEL Primary Care Provider: Emily Filbert Provider/Extender: G Referring Provider: Melina Modena in Treatment: 3 Information Obtained from: Patient Chief Complaint 01/04/17; patient is here for review of wounds on her bilateral lower extremities referred from her primary physician Dr. Emily Filbert [facility] Electronic Signature(s) Signed: 01/26/2017 7:47:19 AM By: Linton Ham MD Entered By: Linton Ham on 01/25/2017 13:36:23 [name] ([id number]) -------------------------------------------------------------------------------- Debridement Details Patient Name: [name] Date of Service: 01/25/2017 12:30 PM Medical Record Patient Account Number: [id number] Number: Treating RN: Ahmed Prima 1934/09/08 (83 y.o. Other Clinician: Date of Birth/Sex: Female) Treating ROBSON, MICHAEL Primary Care Provider: Emily Filbert Provider/Extender: G Referring Provider: Melina Modena in Treatment: 3 Debridement Performed for Wound #1 Left,Dorsal Foot Assessment: Performed By: Physician Ricard Dillon, MD Debridement: Debridement Pre-procedure Yes - 13:17 Verification/Time Out Taken: Start Time: 13:20 Pain Control: Lidocaine 4% Topical Solution Level: Skin/Subcutaneous Tissue Total Area Debrided (L x 4.5 (cm) x 6 (cm) = 27 (cm) W): Tissue and other Viable, Non-Viable, Exudate, Fibrin/Slough, Subcutaneous material debrided: Instrument: Curette Bleeding: Minimum Hemostasis Achieved: Pressure End Time: 13:22 Procedural Pain: 0 Post Procedural Pain: 0 Response to Treatment: Procedure was tolerated  well Post Debridement Measurements of Total Wound Length: (cm) 4.5 Width: (cm) 6 Depth: (cm) 0.6 Volume: (cm) 12.723 Character of Wound/Ulcer Post Requires Further Debridement Debridement: Severity of Tissue Post Debridement: Fat layer exposed Post Procedure Diagnosis Same as Pre-procedure Electronic Signature(s) Signed: 01/25/2017 4:37:07 PM By: Alric Quan Signed: 01/26/2017 7:47:19 AM By: Linton Ham MD [place], [name] ([id number]) Entered By: Linton Ham on 01/25/2017 13:35:40 [name] ([id number]) -------------------------------------------------------------------------------- Debridement Details Patient Name: [name] Date of Service: 01/25/2017 12:30 PM Medical Record Patient Account Number: [id number] Number: Treating RN: Ahmed Prima 29-Aug-1934 (83 y.o. Other Clinician: Date of Birth/Sex: Female) Treating ROBSON, MICHAEL Primary Care Provider: Emily Filbert Provider/Extender: G Referring Provider: Melina Modena in Treatment: 3 Debridement Performed for Wound #2 Right,Dorsal Foot Assessment: Performed By: Physician Ricard Dillon, MD Debridement: Debridement Pre-procedure Yes - 13:17 Verification/Time Out Taken: Start Time: 13:18 Pain Control: Lidocaine 4% Topical Solution Level: Skin/Subcutaneous Tissue Total Area Debrided (L x 6.4 (cm) x 4 (cm) = 25.6 (cm) W): Tissue and other Viable, Non-Viable, Exudate, Fibrin/Slough, Subcutaneous material debrided: Instrument: Curette Bleeding: Minimum Hemostasis Achieved: Pressure End Time: 13:20 Procedural Pain: 0 Post Procedural Pain: 0 Response to Treatment: Procedure was tolerated well Post Debridement Measurements of Total Wound Length: (cm) 6.4 Width: (cm) 4 Depth: (cm) 0.5 Volume: (cm) 10.053 Character of Wound/Ulcer Post Requires Further Debridement Debridement: Severity of Tissue Post Debridement: Fat layer exposed Post Procedure Diagnosis Same as  Pre-procedure Electronic Signature(s) Signed: 01/25/2017 4:37:07 PM By: Alric Quan Signed: 01/26/2017 7:47:19 AM By: Linton Ham MD [place], [name] ([id number]) Entered By: Linton Ham on 01/25/2017 13:36:04 [name] ([id number]) -------------------------------------------------------------------------------- HPI Details Patient Name: [name] Date of Service: 01/25/2017 12:30 PM Medical Record Patient Account Number: [id number] Number: Treating RN: Ahmed Prima 30-Jul-1934 (83 y.o. Other Clinician: Date of Birth/Sex: Female) Treating ROBSON, MICHAEL Primary Care Provider: Emily Filbert Provider/Extender: G Referring Provider: Melina Modena in Treatment: 3 History of Present Illness HPI Description: 01/04/17; this is a 83 year old fairly disabled  woman who comes accompanied by her husband today. She is here for review of wounds on her bilateral feet and right calf. There is a hopeful note from Dr. Sabra Heck that is included. The patient is a type II diabetic with end-stage renal failure on dialysis, she is here for bilateral wounds in her feet and heels as well as the right calf wound posteriorly. The history here is a bit difficult to follow. Her husband states that she had a fracture of her leg in October. Indeed looking [facility] she had a closed nondisplaced fracture of the lateral condyle of the right femur. This was managed nonoperatively with a knee brace. Her husband thinks that the brace itself caused the wounds on the right leg although that would not of caused ones on the left. Apparently the wounds have been there for 2 months and the patient is being followed by [facility] with bilateral Unna boots. She is also followed in Dr. Ozella Almond office of vascular surgery. She had recent arterial studies that showed biphasic waveforms bilaterally with a ABI on the right of 1.01 on the left at 0.93. T ABIs on the right at 0.72 and on  the left at 0.75. He was not felt to have significant arterial disease. I am also not clear what [facility] was primarily dressing the wounds with under the wraps the patient had an admission on 10/17 with an acute right leg DVT status post IVC filter placement and was placed on Eliquis for 6 weeks only. 01/11/17; the x-rays that I ordered last week on this patient have been completed the left is the problem area. She is had a previous distal tib-fib fracture which appears to be better than the last imaging view I can find in September. She appears to have osteomyelitis of the lateral aspect of the talus. Left foot showed bone resorption and loss of cortical Along the medial margin of the navicular and medial cuneiform consistent with on the osteomyelitis. rRght foot and ankle showed diffuse soft tissue swelling without obvious focal bone destruction there was a suggestion of a right fourth metatarsal fracture. boen I actually tried to get more history out of the patient and her husband who is present I've also spoken to her son on the phone. It would appear that the deep area on the lateral aspect of her right knee was abrasive injury also the area on the lateral aspect just above the ankle. The she also probably consistent with pressure ulcers and dorsal ankle extensive wounds probably wrap injuries although I am guessing about some of this. She does not have an arterial issue per Dr. dew. Her left tib-fib fracture was apparently in April as well she had a left femur fracture sometime in 2014 2015. She has a rod in her left femur hopefully we can get her through an MRI. I looked [facility] back to 2009 I don't actually see an x-ray of the left femur. 01/18/17; her MRI is booked for February 13. We had some trouble with home health and the dressings that were prescribed so they left the current wrap on all week. There is increasing erythema on the medial aspect of the right  foot compatible with cellulitis she will need an antibiotic [name], [name] ([id number]) 01/25/17; patient's MRI is actually booked for February 15 not used Dragon. This is a patient that has multiple bilateral lower extremity wounds. I have never been completely certain how these happened and in fact they may  be multifactorial. I have suspected that the area on her right lateral upper leg, right lateral lower leg were probably brace injuries. She has 2 large wounds with exposed tendon on her dorsal feet bilaterally. I suspect these were wrap injuries. She has pressure ulcers on her bilateral heels. She does not have significant arterial insufficiency and is previously been seen by Dr.Dew. A plain x-ray of her left foot suggested osteomyelitis which is the reason for the MRI. I gave her doxycycline last week for left foot cellulitis which looks better. Electronic Signature(s) Signed: 01/26/2017 7:47:19 AM By: Linton Ham MD Entered By: Linton Ham on 01/25/2017 13:39:04 [name] ([id number]) -------------------------------------------------------------------------------- Physical Exam Details Patient Name: [name] Date of Service: 01/25/2017 12:30 PM Medical Record Patient Account Number: [id number] Number: Treating RN: Ahmed Prima [birth year] (83 y.o. Other Clinician: Date of Birth/Sex: Female) Treating ROBSON, MICHAEL Primary Care Provider: Emily Filbert Provider/Extender: G Referring Provider: Melina Modena in Treatment: 3 Constitutional Sitting or standing Blood Pressure is within target range for patient.. Pulse regular and within target range for patient.Marland Kitchen Respirations regular, non-labored and within target range.. Temperature is normal and within the target range for the patient.. The patient is bright and alert. Chronically ill-looking. Eyes Conjunctivae clear. No discharge.Marland Kitchen Respiratory Respiratory effort is easy and symmetric bilaterally.  Rate is normal at rest and on room air.Marland Kitchen Lymphatic None palpable in the popliteal or inguinal area. Notes Wound exam; 1] the large areas over her dorsal feet which I am assuming were wrap injuries. If there is a different explanation for these I really don't know what these are. The area on the right leg appears to have a viable surface. On the left there is exposed tendon. The erythema around this is better. Both wound areas debrided with a #5 curet #2] the patient has a large black eschar on her left heel which I crosshatched today #3 area on her right heel has exposed Achilles tendon #4 small wound on the right lateral lower leg I think also has surface tendon #5 extensive wound on the lateral aspect of her right knee. This is a deep wound with reasonable granulation although I don't think we are progressing towards closure Electronic Signature(s) Signed: 01/26/2017 7:47:19 AM By: Linton Ham MD Entered By: Linton Ham on 01/25/2017 13:42:21 [name] ([id number]) -------------------------------------------------------------------------------- Physician Orders Details Patient Name: [name] Date of Service: 01/25/2017 12:30 PM Medical Record Patient Account Number: [id number] Number: Treating RN: Ahmed Prima [birth year] (83 y.o. Other Clinician: Date of Birth/Sex: Female) Treating ROBSON, MICHAEL Primary Care Provider: Emily Filbert Provider/Extender: G Referring Provider: Melina Modena in Treatment: 3 Verbal / Phone Orders: Yes Clinician: Carolyne Fiscal, Debi Read Back and Verified: Yes Diagnosis Coding Wound Cleansing Wound #1 Left,Dorsal Foot o Clean wound with Normal Saline. o Cleanse wound with mild soap and water Wound #2 Right,Dorsal Foot o Clean wound with Normal Saline. o Cleanse wound with mild soap and water Wound #3 Left Calcaneus o Clean wound with Normal Saline. o Cleanse wound with mild soap and water Wound #4  Right Calcaneus o Clean wound with Normal Saline. o Cleanse wound with mild soap and water Wound #6 Right,Lateral,Posterior Lower Leg o Clean wound with Normal Saline. o Cleanse wound with mild soap and water Wound #7 Right,Lateral Malleolus o Clean wound with Normal Saline. o Cleanse wound with mild soap and water Anesthetic Wound #1 Left,Dorsal Foot o Topical Lidocaine 4% cream applied to wound bed prior to debridement -  for clinic use only Wound #2 Right,Dorsal Foot o Topical Lidocaine 4% cream applied to wound bed prior to debridement - for clinic use only Wound #3 Left Calcaneus o Topical Lidocaine 4% cream applied to wound bed prior to debridement - for clinic use only Wound #4 Right Calcaneus [name], [name]. ([id number]) o Topical Lidocaine 4% cream applied to wound bed prior to debridement - for clinic use only Wound #6 Right,Lateral,Posterior Lower Leg o Topical Lidocaine 4% cream applied to wound bed prior to debridement - for clinic use only Wound #7 Right,Lateral Malleolus o Topical Lidocaine 4% cream applied to wound bed prior to debridement - for clinic use only Primary Wound Dressing Wound #1 Left,Dorsal Foot o Iodoflex Wound #2 Right,Dorsal Foot o Iodoflex Wound #3 Left Calcaneus o Medihoney gel Wound #4 Right Calcaneus o Medihoney gel Wound #6 Right,Lateral,Posterior Lower Leg o Prisma Ag Wound #7 Right,Lateral Malleolus o Prisma Ag Wound #8 Left,Lateral,Posterior Lower Leg o Prisma Ag Secondary Dressing Wound #1 Left,Dorsal Foot o ABD pad o Dry Gauze o Drawtex Wound #2 Right,Dorsal Foot o ABD pad o Dry Gauze o Drawtex Wound #3 Left Calcaneus o ABD pad o Dry Gauze o Other - heel cup Wound #4 Right Calcaneus o ABD pad Harts, Velva R. ([id number]) o Dry Gauze o Other - heel cup Wound #6 Right,Lateral,Posterior Lower Leg o ABD pad o Dry Gauze o Drawtex Wound #7 Right,Lateral  Malleolus o ABD pad o Dry Gauze Wound #8 Left,Lateral,Posterior Lower Leg o ABD pad o Dry Gauze o Drawtex Dressing Change Frequency Wound #1 Left,Dorsal Foot o Three times weekly - pt will have this changed on [facility]uesdays in the [facility] Wound #2 Right,Dorsal Foot o Three times weekly - pt will have this changed on Tuesdays in the [facility] Wound #3 Left Calcaneus o Three times weekly - pt will have this changed on Tuesdays in the [facility] Wound #4 Right Calcaneus o Three times weekly - pt will have this changed on Tuesdays in the [facility] Wound #6 Right,Lateral,Posterior Lower Leg o Three times weekly - pt will have this changed on Tuesdays in the [facility] Wound #7 Right,Lateral Malleolus o Three times weekly - pt will have this changed on Tuesdays in the [facility] Follow-up Appointments Wound #1 Left,Dorsal Foot o Return Appointment in 1 week. Wound #2 Right,Dorsal Foot o Return Appointment in 1 week. Wound #3 Left Calcaneus o Return Appointment in 1 week. Wound #4 Right Calcaneus o Return Appointment in 1 week. [name], [name] ([id number]) Wound #6 Right,Lateral,Posterior Lower Leg o Return Appointment in 1 week. Edema Control Wound #1 Left,Dorsal Foot o 3 Layer Compression System - Bilateral o Elevate legs to the level of the heart and pump ankles as often as possible Wound #2 Right,Dorsal Foot o 3 Layer Compression System - Bilateral o Elevate legs to the level of the heart and pump ankles as often as possible Wound #3 Left Calcaneus o 3 Layer Compression System - Bilateral o Elevate legs to the level of the heart and pump ankles as often as possible Wound #4 Right Calcaneus o 3 Layer Compression System - Bilateral o Elevate legs to the level of the heart and pump ankles as often as possible Wound #6 Right,Lateral,Posterior Lower Leg o 3 Layer Compression System -  Bilateral o Elevate legs to the level of the heart and pump ankles as often as possible Wound #7 Right,Lateral Malleolus o 3 Layer Compression System - Bilateral   o Elevate legs to the level of the heart and pump ankles as often as possible Off-Loading Wound #1 Left,Dorsal Foot o Turn and reposition every 2 hours o Other: - float heels above heart level while in the bed Wound #2 Right,Dorsal Foot o Turn and reposition every 2 hours o Other: - float heels above heart level while in the bed Wound #3 Left Calcaneus o Turn and reposition every 2 hours o Other: - float heels above heart level while in the bed Wound #4 Right Calcaneus o Turn and reposition every 2 hours o Other: - float heels above heart level while in the bed Wound #6 Right,Lateral,Posterior Lower Leg o Turn and reposition every 2 hours [name], Connie R. ([id number]) o Other: - float heels above heart level while in the bed Wound #7 Right,Lateral Malleolus o Turn and reposition every 2 hours o Other: - float heels above heart level while in the bed Additional Orders / Instructions Wound #1 Left,Dorsal Foot o Increase protein intake. Wound #2 Right,Dorsal Foot o Increase protein intake. Wound #3 Left Calcaneus o Increase protein intake. Wound #4 Right Calcaneus o Increase protein intake. Wound #6 Right,Lateral,Posterior Lower Leg o Increase protein intake. Wound #7 Right,Lateral Malleolus o Increase protein intake. Home Health Wound #1 [place] Visits o Home Health Nurse may visit PRN to address patientos wound care needs. o FACE TO FACE ENCOUNTER: MEDICARE and MEDICAID PATIENTS: I certify that this patient is under my care and that I had a face-to-face encounter that meets the physician face-to-face encounter requirements with this patient on this date. The encounter with the patient was in whole or in part for the following MEDICAL  CONDITION: (primary reason for [place]) MEDICAL NECESSITY: I certify, that based on my findings, NURSING services are a medically necessary home health service. HOME BOUND STATUS: I certify that my clinical findings support that this patient is homebound (i.e., Due to illness or injury, pt requires aid of supportive devices such as crutches, cane, wheelchairs, walkers, the use of special transportation or the assistance of another person to leave their place of residence. There is a normal inability to leave the home and doing so requires considerable and taxing effort. Other absences are for medical reasons / religious services and are infrequent or of short duration when for other reasons). o If current dressing causes regression in wound condition, may D/C ordered dressing product/s and apply Normal Saline Moist Dressing daily until next [place] / Other MD appointment. [place] of regression in wound condition at [phone]. o Please direct any NON-WOUND related issues/requests for orders to patient's Primary Care Physician Wound #2 [place] Visits SHEMECA, WEHRLI ([id number] Nurse may visit PRN to address patientos wound care needs. o FACE TO FACE ENCOUNTER: MEDICARE and MEDICAID PATIENTS: I certify that this patient is under my care and that I had a face-to-face encounter that meets the physician face-to-face encounter requirements with this patient on this date. The encounter with the patient was in whole or in part for the following MEDICAL CONDITION: (primary reason for [place]) MEDICAL NECESSITY: I certify, that based on my findings, NURSING services are a medically necessary home health service. HOME BOUND STATUS: I certify that my clinical findings support that this patient is homebound (i.e., Due to illness or injury, pt requires aid of supportive devices such as crutches,  cane, wheelchairs, walkers, the use of special transportation or  the assistance of another person to leave their place of residence. There is a normal inability to leave the home and doing so requires considerable and taxing effort. Other absences are for medical reasons / religious services and are infrequent or of short duration when for other reasons). o If current dressing causes regression in wound condition, may D/C ordered dressing product/s and apply Normal Saline Moist Dressing daily until next [place] / Other MD appointment. [place] of regression in wound condition at [phone]. o Please direct any NON-WOUND related issues/requests for orders to patient's Primary Care Physician Wound #3 Left [place] Nurse may visit PRN to address patientos wound care needs. o FACE TO FACE ENCOUNTER: MEDICARE and MEDICAID PATIENTS: I certify that this patient is under my care and that I had a face-to-face encounter that meets the physician face-to-face encounter requirements with this patient on this date. The encounter with the patient was in whole or in part for the following MEDICAL CONDITION: (primary reason for [place]) MEDICAL NECESSITY: I certify, that based on my findings, NURSING services are a medically necessary home health service. HOME BOUND STATUS: I certify that my clinical findings support that this patient is homebound (i.e., Due to illness or injury, pt requires aid of supportive devices such as crutches, cane, wheelchairs, walkers, the use of special transportation or the assistance of another person to leave their place of residence. There is a normal inability to leave the home and doing so requires considerable and taxing effort. Other absences are for medical reasons / religious services and are infrequent or of short duration when for other reasons). o If current dressing  causes regression in wound condition, may D/C ordered dressing product/s and apply Normal Saline Moist Dressing daily until next [place] / Other MD appointment. [place] of regression in wound condition at [phone]. o Please direct any NON-WOUND related issues/requests for orders to patient's Primary Care Physician Wound #4 Right [place] Nurse may visit PRN to address patientos wound care needs. o FACE TO FACE ENCOUNTER: MEDICARE and MEDICAID PATIENTS: I certify that this patient is under my care and that I had a face-to-face encounter that meets the physician face-to-face encounter requirements with this patient on this date. The encounter with the patient was in whole or in part for the following MEDICAL CONDITION: (primary reason for [place]) MEDICAL NECESSITY: I certify, that based on my findings, NURSING services are a medically necessary home health service. HOME BOUND STATUS: I certify that my clinical findings support that this patient is homebound (i.e., Due to illness or injury, pt requires aid of LENNOX, KANTOR. ([id number]) supportive devices such as crutches, cane, wheelchairs, walkers, the use of special transportation or the assistance of another person to leave their place of residence. There is a normal inability to leave the home and doing so requires considerable and taxing effort. Other absences are for medical reasons / religious services and are infrequent or of short duration when for other reasons). o If current dressing causes regression in wound condition, may D/C ordered dressing product/s and apply Normal Saline Moist Dressing daily until next [place] / Other MD appointment. [place] of regression in wound condition at [phone]. o Please direct any NON-WOUND related issues/requests for orders to patient's Primary  Care Physician Wound #6 [place]  Visits o [place] Nurse may visit PRN to address patientos wound care needs. o FACE TO FACE ENCOUNTER: MEDICARE and MEDICAID PATIENTS: I certify that this patient is under my care and that I had a face-to-face encounter that meets the physician face-to-face encounter requirements with this patient on this date. The encounter with the patient was in whole or in part for the following MEDICAL CONDITION: (primary reason for [place]) MEDICAL NECESSITY: I certify, that based on my findings, NURSING services are a medically necessary home health service. HOME BOUND STATUS: I certify that my clinical findings support that this patient is homebound (i.e., Due to illness or injury, pt requires aid of supportive devices such as crutches, cane, wheelchairs, walkers, the use of special transportation or the assistance of another person to leave their place of residence. There is a normal inability to leave the home and doing so requires considerable and taxing effort. Other absences are for medical reasons / religious services and are infrequent or of short duration when for other reasons). o If current dressing causes regression in wound condition, may D/C ordered dressing product/s and apply Normal Saline Moist Dressing daily until next [place] / Other MD appointment. [place] of regression in wound condition at [phone]. o Please direct any NON-WOUND related issues/requests for orders to patient's Primary Care Physician Wound #7 [place] Nurse may visit PRN to address patientos wound care needs. o FACE TO FACE ENCOUNTER: MEDICARE and MEDICAID PATIENTS: I certify that this patient is under my care and that I had a face-to-face encounter that meets the physician face-to-face encounter requirements  with this patient on this date. The encounter with the patient was in whole or in part for the following MEDICAL CONDITION: (primary reason for [place]) MEDICAL NECESSITY: I certify, that based on my findings, NURSING services are a medically necessary home health service. HOME BOUND STATUS: I certify that my clinical findings support that this patient is homebound (i.e., Due to illness or injury, pt requires aid of supportive devices such as crutches, cane, wheelchairs, walkers, the use of special transportation or the assistance of another person to leave their place of residence. There is a normal inability to leave the home and doing so requires considerable and taxing effort. Other absences are for medical reasons / religious services and are infrequent or of short duration when for other reasons). o If current dressing causes regression in wound condition, may D/C ordered dressing product/s and apply Normal Saline Moist Dressing daily until next [place] / Other MD appointment. [place] of regression in wound condition at [phone]. [name], [name] ([id number]) o Please direct any NON-WOUND related issues/requests for orders to patient's Primary Care Physician Medications-please add to medication list. Wound #1 Left,Dorsal Foot o Other: - Vitamin C, Zinc, MVI Wound #2 Right,Dorsal Foot o Other: - Vitamin C, Zinc, MVI Wound #3 Left Calcaneus o Other: - Vitamin C, Zinc, MVI Wound #4 Right Calcaneus o Other: - Vitamin C, Zinc, MVI Wound #6 Right,Lateral,Posterior Lower Leg o Other: - Vitamin C, Zinc, MVI Wound #7 Right,Lateral Malleolus o Other: - Vitamin C, Zinc, MVI Electronic Signature(s) Signed: 01/25/2017 4:37:07 PM By: Alric Quan Signed: 01/26/2017 7:47:19 AM By: Linton Ham MD Entered By: Alric Quan on 01/25/2017 [place], Alger  ([id number]) -------------------------------------------------------------------------------- Problem List Details Patient Name: [name] Date of Service: 01/25/2017 12:30 PM Medical Record Patient Account Number:  [id number] Number: Treating RN: Ahmed Prima 1933-12-19 (83 y.o. 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/2017 Yes muscle involvement without evidence of necrosis L97.515 Non-pressure chronic ulcer of other part of right foot with 01/04/2017 Yes muscle involvement without evidence of necrosis L89.613 Pressure ulcer of right heel, stage 3 01/04/2017 Yes L89.620 Pressure ulcer of left heel, unstageable 01/04/2017 Yes L97.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 Other Clinician: Date of Birth/Sex: Female) Treating ROBSON, MICHAEL Primary Care Provider: Emily Filbert Provider/Extender: G Referring Provider: Melina Modena in Treatment: 3 Subjective Chief Complaint Information obtained from  Patient 01/04/17; patient is here for review of wounds on her bilateral lower extremities referred from her primary physician Dr. Emily Fil[facility] History of Present Illness (HPI) 01/04/17; this is a 82 year old fairly disabled woman who comes accompanied by her husband today. She is here for review of wounds on her bilateral feet and right calf. There is a hopeful note from Dr. Sabra Heck that is included. The patient is a type II diabetic with end-stage renal failure on dialysis, she is here for bilateral wounds in her feet and heels as well as the right calf wound posteriorly. The history here is a bit difficult to follow. Her husband states that she had a fracture of her leg in October. Indeed looking through [facility] she had a closed nondisplaced fracture of the lateral condyle of the right femur. This was managed nonoperatively with a knee brace. Her husband thinks that the brace itself caused the wounds on the right leg although that would not of caused ones on the left. Apparently the wounds have been there for 2 months and the patient is being followed by [facility] with bilateral Unna boots. She is also followed in Dr. Ozella Almond office of vascular surgery. She had recent arterial studies that showed biphasic waveforms bilaterally with a ABI on the right of 1.01 on the left at 0.93. T ABIs on the right at 0.72 and on the left at 0.75. He was not felt to have significant arterial disease. I am also not clear what [facility] was primarily dressing the wounds with under the wraps the patient had an admission on 10/17 with an acute right leg DVT status post IVC filter placement and was placed on Eliquis for 6 weeks only. 01/11/17; the x-rays that I ordered last week on this patient have been completed the left is the problem area. She is had a previous distal tib-fib fracture which appears to be better than the last imaging view I can find in September. She  appears to have osteomyelitis of the lateral aspect of the talus. Left foot showed bone resorption and loss of cortical Along the medial margin of the navicular and medial cuneiform consistent with on the  osteomyelitis. rRght foot and ankle showed diffuse soft tissue swelling without obvious focal bone destruction there was a suggestion of a right fourth metatarsal fracture. boen I actually tried to get more history out of the patient and her husband who is present I've also spoken to her son on the phone. It would appear that the deep area on the lateral aspect of her right knee was abrasive injury also the area on the lateral aspect just above the ankle. The she also probably consistent with pressure ulcers and dorsal ankle extensive wounds probably wrap injuries although I am guessing about some of this. She does not have an arterial issue per Dr. dew. Her left tib-fib fracture was apparently in April [name], [name] ([id number]) as well she had a left femur fracture sometime in 2014 2015. She has a rod in her left femur hopefully we can get her through an MRI. I looked through [facility] back to 2009 I don't actually see an x-ray of the left femur. 01/18/17; her MRI is booked for February 13. We had some trouble with home health and the dressings that were prescribed so they left the current wrap on all week. There is increasing erythema on the medial aspect of the right foot compatible with cellulitis she will need an antibiotic 01/25/17; patient's MRI is actually booked for February 15 not used [facility]. This is a patient that has multiple bilateral lower extremity wounds. I have never been completely certain how these happened and in fact they may be multifactorial. I have suspected that the area on her right lateral upper leg, right lateral lower leg were probably brace injuries. She has 2 large wounds with exposed tendon on her dorsal feet bilaterally. I suspect these were wrap injuries.  She has pressure ulcers on her bilateral heels. She does not have significant arterial insufficiency and is previously been seen by Dr.Dew. A plain x-ray of her left foot suggested osteomyelitis which is the reason for the MRI. I gave her doxycycline last week for left foot cellulitis which looks better. Objective Constitutional Sitting or standing Blood Pressure is within target range for patient.. Pulse regular and within target range for patient.Marland Kitchen Respirations regular, non-labored and within target range.. Temperature is normal and within the target range for the patient.. The patient is bright and alert. Chronically ill-looking. Vitals Time Taken: 12:36 PM, Height: 61 in, Weight: 206 lbs, BMI: 38.9, Temperature: 98.0 F, Pulse: 86 bpm, Respiratory Rate: 16 breaths/min, Blood Pressure: 102/58 mmHg. Eyes Conjunctivae clear. No discharge.Marland Kitchen Respiratory Respiratory effort is easy and symmetric bilaterally. Rate is normal at rest and on room air.Marland Kitchen Lymphatic None palpable in the popliteal or inguinal area. General Notes: Wound exam; 1] the large areas over her dorsal feet which I am assuming were wrap injuries. If there is a different explanation for these I really don't know what these are. The area on the right leg appears to have a viable surface. On the left there is exposed tendon. The erythema around this is better. Both wound areas debrided with a #5 curet #2] the patient has a large black eschar on her left heel which I crosshatched today #3 area on her right heel has exposed Achilles tendon #4 small wound on the right lateral lower leg I think also has surface tendon #5 extensive wound on the lateral aspect of her right knee. This is a deep wound with reasonable granulation although I don't think we are progressing towards closure [name], Connie R. ([id number]) Integumentary (Hair,  Skin) Wound #1 status is Open. Original cause of wound was Gradually Appeared. The wound is located on  the Left,Dorsal Foot. The wound measures 4.5cm length x 6cm width x 0.6cm depth; 21.206cm^2 area and 12.723cm^3 volume. There is Fat Layer (Subcutaneous Tissue) Exposed exposed. There is no tunneling or undermining noted. There is a medium amount of serous drainage noted. The wound margin is well defined and not attached to the wound base. There is no granulation within the wound bed. There is a large (67- 100%) amount of necrotic tissue within the wound bed including Eschar and Adherent Slough. The periwound skin appearance exhibited: Callus, Crepitus, Excoriation, Induration, Rash, Scarring, Dry/Scaly, Maceration, Atrophie Blanche, Cyanosis, Ecchymosis, Hemosiderin Staining, Mottled, Pallor, Rubor, Erythema. The surrounding wound skin color is noted with erythema. Wound #2 status is Open. Original cause of wound was Gradually Appeared. The wound is located on the Right,Dorsal Foot. The wound measures 6.4cm length x 4cm width x 0.5cm depth; 20.106cm^2 area and 10.053cm^3 volume. There is Fat Layer (Subcutaneous Tissue) Exposed exposed. There is no tunneling or undermining noted. There is a large amount of purulent drainage noted. The wound margin is distinct with the outline attached to the wound base. There is small (1-33%) pink granulation within the wound bed. There is a large (67-100%) amount of necrotic tissue within the wound bed including Eschar and Adherent Slough. The periwound skin appearance exhibited: Maceration, Erythema. The surrounding wound skin color is noted with erythema which is circumferential. Periwound temperature was noted as No Abnormality. The periwound has tenderness on palpation. Wound #3 status is Open. Original cause of wound was Pressure Injury. The wound is located on the Left Calcaneus. The wound measures 3.5cm length x 8cm width x 0.5cm depth; 21.991cm^2 area and 10.996cm^3 volume. There is no tunneling or undermining noted. There is a large amount of  serous drainage noted. The wound margin is distinct with the outline attached to the wound base. There is no granulation within the wound bed. There is a large (67-100%) amount of necrotic tissue within the wound bed including Eschar. The periwound skin appearance exhibited: Erythema. The surrounding wound skin color is noted with erythema which is circumferential. Periwound temperature was noted as No Abnormality. The periwound has tenderness on palpation. Wound #4 status is Open. Original cause of wound was Pressure Injury. The wound is located on the Right Calcaneus. The wound measures 4.6cm length x 2.5cm width x 0.4cm depth; 9.032cm^2 area and 3.613cm^3 volume. There is no tunneling or undermining noted. There is a large amount of purulent drainage noted. The wound margin is distinct with the outline attached to the wound base. There is no granulation within the wound bed. There is a large (67-100%) amount of necrotic tissue within the wound bed including Eschar and Adherent Slough. The periwound skin appearance exhibited: Erythema. The surrounding wound skin color is noted with erythema which is circumferential. Periwound temperature was noted as No Abnormality. The periwound has tenderness on palpation. Wound #5 status is Open. Original cause of wound was Gradually Appeared. The wound is located on the Right,Anterior Lower Leg. The wound measures 0cm length x 0cm width x 0cm depth; 0cm^2 area and 0cm^3 volume. The wound is limited to skin breakdown. There is no tunneling or undermining noted. There is a none present amount of drainage noted. The wound margin is distinct with the outline attached to the wound base. There is no granulation within the wound bed. There is no necrotic tissue within the wound bed. Periwound  temperature was noted as No Abnormality. The periwound has tenderness on palpation. Wound #6 status is Open. Original cause of wound was Gradually Appeared. The wound is  located on the Right,Lateral,Posterior Lower Leg. The wound measures 5cm length x 2cm width x 2cm depth; 7.854cm^2 area and 15.708cm^3 volume. There is no tunneling or undermining noted. There is a large amount of serosanguineous drainage noted. The wound margin is distinct with the outline attached to the wound base. [name], Connie R. ([id number]) There is no granulation within the wound bed. There is a large (67-100%) amount of necrotic tissue within the wound bed including Adherent Slough. The periwound skin appearance exhibited: Maceration, Erythema. The surrounding wound skin color is noted with erythema which is circumferential. Periwound temperature was noted as No Abnormality. The periwound has tenderness on palpation. Wound #7 status is Open. Original cause of wound was Gradually Appeared. The wound is located on the Right,Lateral Malleolus. The wound measures 0.7cm length x 0.8cm width x 0.3cm depth; 0.44cm^2 area and 0.132cm^3 volume. There is no tunneling or undermining noted. There is a large amount of serous drainage noted. The wound margin is distinct with the outline attached to the wound base. There is no granulation within the wound bed. There is a large (67-100%) amount of necrotic tissue within the wound bed including Adherent Slough. Wound #8 status is Open. Original cause of wound was Gradually Appeared. The wound is located on the Left,Lateral,Posterior Lower Leg. The wound measures 0.3cm length x 0.5cm width x 0.1cm depth; 0.118cm^2 area and 0.012cm^3 volume. The wound is limited to skin breakdown. There is no tunneling or undermining noted. There is a large amount of serosanguineous drainage noted. The wound margin is distinct with the outline attached to the wound base. There is large (67-100%) pink granulation within the wound bed. There is no necrotic tissue within the wound bed. Periwound temperature was noted as No Abnormality. The periwound has tenderness on  palpation. 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 There was a Skin/Subcutaneous Tissue Debridement [id number]) debridement with total area of 27 sq cm performed by Ricard Dillon, MD. with the following instrument(s): Curette to remove Viable and Non-Viable tissue/material including Exudate, Fibrin/Slough, and Subcutaneous after achieving pain control using Azua, Pierre R. ([id number]) Lidocaine 4% Topical Solution. A time out was conducted at 13:17, prior to the start of the procedure. A Minimum amount of bleeding was controlled with Pressure. The procedure was tolerated well with a pain level of 0 throughout and a pain level of 0 following the procedure. Post Debridement Measurements: 4.5cm length x 6cm width x 0.6cm depth; 12.723cm^3 volume. Character of Wound/Ulcer Post Debridement requires further debridement. Severity of Tissue Post Debridement is: Fat layer exposed. Post procedure Diagnosis Wound #1: Same as Pre-Procedure Wound #2 Wound #2 is a Diabetic Wound/Ulcer of the Lower Extremity located on the Right,Dorsal Foot . There was a Skin/Subcutaneous Tissue Debridement [id number]) debridement with total  area of 25.6 sq cm performed by Ricard Dillon, MD. with the following instrument(s): Curette to remove Viable and Non-Viable tissue/material including  Exudate, Fibrin/Slough, and Subcutaneous after achieving pain control using Lidocaine 4% Topical Solution. A time out was conducted at 13:17, prior to the start of the procedure. A Minimum amount of bleeding was controlled with Pressure. The procedure was tolerated well with a pain level of 0 throughout and a pain level of 0 following the procedure. Post Debridement Measurements: 6.4cm length x 4cm width x 0.5cm depth; 10.053cm^3 volume. Character of Wound/Ulcer Post Debridement requires further debridement. Severity of Tissue Post Debridement is: Fat layer exposed. Post procedure Diagnosis Wound #2: Same as Pre-Procedure Plan Wound Cleansing: Wound #1 Left,Dorsal Foot: Clean wound with Normal Saline. Cleanse wound with mild soap and water Wound #2 Right,Dorsal Foot: Clean wound with Normal Saline. Cleanse wound with mild soap and water Wound #3 Left Calcaneus: Clean wound with Normal Saline. Cleanse wound with mild soap and water Wound #4 Right Calcaneus: Clean wound with Normal Saline. Cleanse wound with mild soap and water Wound #6 Right,Lateral,Posterior Lower Leg: Clean wound with Normal Saline. Cleanse wound with mild soap and water Wound #7 Right,Lateral Malleolus: Clean wound with Normal Saline. Cleanse wound with mild soap and water Anesthetic: Wound #1 Left,Dorsal Foot: AVALINA, LIECHTY R. ([id number]) Topical Lidocaine 4% cream applied to wound bed prior to debridement - for clinic use only Wound #2 Right,Dorsal Foot: Topical Lidocaine 4% cream applied to wound bed prior to debridement - for clinic use only Wound #3 Left Calcaneus: Topical Lidocaine 4% cream applied to wound bed prior to debridement - for clinic use only Wound #4 Right Calcaneus: Topical Lidocaine 4% cream applied to wound bed prior to debridement - for clinic use only Wound #6 Right,Lateral,Posterior Lower Leg: Topical Lidocaine 4% cream applied to wound bed prior to debridement - for clinic use  only Wound #7 Right,Lateral Malleolus: Topical Lidocaine 4% cream applied to wound bed prior to debridement - for clinic use only Primary Wound Dressing: Wound #1 Left,Dorsal Foot: Iodoflex Wound #2 Right,Dorsal Foot: Iodoflex Wound #3 Left Calcaneus: Medihoney gel Wound #4 Right Calcaneus: Medihoney gel Wound #6 Right,Lateral,Posterior Lower Leg: Prisma Ag Wound #7 Right,Lateral Malleolus: Prisma Ag Wound #8 Left,Lateral,Posterior Lower Leg: Prisma Ag Secondary Dressing: Wound #1 Left,Dorsal Foot: ABD pad Dry Gauze Drawtex Wound #2 Right,Dorsal Foot: ABD pad Dry Gauze Drawtex Wound #3 Left Calcaneus: ABD pad Dry Gauze Other - heel cup Wound #4 Right Calcaneus: ABD pad Dry Gauze Other - heel cup Wound #6 Right,Lateral,Posterior Lower Leg: ABD pad Dry Gauze Drawtex Wound #7 Right,Lateral Malleolus: ABD pad Dry Gauze Wound #8 Left,Lateral,Posterior Lower Leg: Rust, Carisha R. ([id number]) ABD pad Dry Gauze Drawtex Dressing Change Frequency: Wound #1 Left,Dorsal Foot: Three times weekly - pt will have this changed on Tuesdays in the [facility] Wound #2 Right,Dorsal Foot: Three times weekly - pt will have this changed on Tuesdays in the [facility] Wound #3 Left Calcaneus: Three times weekly - pt will have this changed on Tuesdays in the [facility] Wound #4 Right Calcaneus: Three times weekly - pt will have this changed on Tuesdays in the [facility] Wound #6 Right,Lateral,Posterior Lower Leg: Three times weekly - pt will have this changed on Tuesdays in the [facility] Wound #7 Right,Lateral Malleolus: Three times weekly - pt will have this changed on Tuesdays in the [facility] Follow-up Appointments: Wound #1 Left,Dorsal Foot: Return Appointment in  1 week. Wound #2 Right,Dorsal Foot: Return Appointment in 1 week. Wound #3 Left Calcaneus: Return Appointment in 1 week. Wound #4 Right Calcaneus: Return Appointment in 1  week. Wound #6 Right,Lateral,Posterior Lower Leg: Return Appointment in 1 week. Edema Control: Wound #1 Left,Dorsal Foot: 3 Layer Compression System - Bilateral Elevate legs to the level of the heart and pump ankles as often as possible Wound #2 Right,Dorsal Foot: 3 Layer Compression System - Bilateral Elevate legs to the level of the heart and pump ankles as often as possible Wound #3 Left Calcaneus: 3 Layer Compression System - Bilateral Elevate legs to the level of the heart and pump ankles as often as possible Wound #4 Right Calcaneus: 3 Layer Compression System - Bilateral Elevate legs to the level of the heart and pump ankles as often as possible Wound #6 Right,Lateral,Posterior Lower Leg: 3 Layer Compression System - Bilateral Elevate legs to the level of the heart and pump ankles as often as possible Wound #7 Right,Lateral Malleolus: 3 Layer Compression System - Bilateral Elevate legs to the level of the heart and pump ankles as often as possible Off-Loading: Wound #1 Left,Dorsal Foot: Turn and reposition every 2 hours Other: - float heels above heart level while in the bed Wound #2 Right,Dorsal Foot: [name], TATIS R. ([id number]) Turn and reposition every 2 hours Other: - float heels above heart level while in the bed Wound #3 Left Calcaneus: Turn and reposition every 2 hours Other: - float heels above heart level while in the bed Wound #4 Right Calcaneus: Turn and reposition every 2 hours Other: - float heels above heart level while in the bed Wound #6 Right,Lateral,Posterior Lower Leg: Turn and reposition every 2 hours Other: - float heels above heart level while in the bed Wound #7 Right,Lateral Malleolus: Turn and reposition every 2 hours Other: - float heels above heart level while in the bed Additional Orders / Instructions: Wound #1 Left,Dorsal Foot: Increase protein intake. Wound #2 Right,Dorsal Foot: Increase protein intake. Wound #3 Left  Calcaneus: Increase protein intake. Wound #4 Right Calcaneus: Increase protein intake. Wound #6 Right,Lateral,Posterior Lower Leg: Increase protein intake. Wound #7 Right,Lateral Malleolus: Increase protein intake. Home Health: Wound #1 Left,Dorsal Foot: [place] Nurse may visit PRN to address patient s wound care needs. FACE TO FACE ENCOUNTER: MEDICARE and MEDICAID PATIENTS: I certify that this patient is under my care and that I had a face-to-face encounter that meets the physician face-to-face encounter requirements with this patient on this date. The encounter with the patient was in whole or in part for the following MEDICAL CONDITION: (primary reason for [place]) MEDICAL NECESSITY: I certify, that based on my findings, NURSING services are a medically necessary home health service. HOME BOUND STATUS: I certify that my clinical findings support that this patient is homebound (i.e., Due to illness or injury, pt requires aid of supportive devices such as crutches, cane, wheelchairs, walkers, the use of special transportation or the assistance of another person to leave their place of residence. There is a normal inability to leave the home and doing so requires considerable and taxing effort. Other absences are for medical reasons / religious services and are infrequent or of short duration when for other reasons). If current dressing causes regression in wound condition, may D/C ordered dressing product/s and apply Normal Saline Moist Dressing daily until next [place] / Other MD appointment. [place] of regression in wound condition at [phone]. Please  direct any NON-WOUND related issues/requests for orders to patient's Primary Care Physician Wound #2 Right,Dorsal Foot: [place] Nurse may visit PRN to address patient s wound care needs. FACE TO FACE ENCOUNTER: MEDICARE and  MEDICAID PATIENTS: I certify that this patient is under my care and that I had a face-to-face encounter that meets the physician face-to-face encounter requirements with this patient on this date. The encounter with the patient was in whole or in part for the [place]. ([id number]) following MEDICAL CONDITION: (primary reason for [place]) MEDICAL NECESSITY: I certify, that based on my findings, NURSING services are a medically necessary home health service. HOME BOUND STATUS: I certify that my clinical findings support that this patient is homebound (i.e., Due to illness or injury, pt requires aid of supportive devices such as crutches, cane, wheelchairs, walkers, the use of special transportation or the assistance of another person to leave their place of residence. There is a normal inability to leave the home and doing so requires considerable and taxing effort. Other absences are for medical reasons / religious services and are infrequent or of short duration when for other reasons). If current dressing causes regression in wound condition, may D/C ordered dressing product/s and apply Normal Saline Moist Dressing daily until next [place] / Other MD appointment. [place] of regression in wound condition at [phone]. Please direct any NON-WOUND related issues/requests for orders to patient's Primary Care Physician Wound #3 Left Calcaneus: [place] Nurse may visit PRN to address patient s wound care needs. FACE TO FACE ENCOUNTER: MEDICARE and MEDICAID PATIENTS: I certify that this patient is under my care and that I had a face-to-face encounter that meets the physician face-to-face encounter requirements with this patient on this date. The encounter with the patient was in whole or in part for the following MEDICAL CONDITION: (primary reason for [place]) MEDICAL NECESSITY: I certify, that based on my  findings, NURSING services are a medically necessary home health service. HOME BOUND STATUS: I certify that my clinical findings support that this patient is homebound (i.e., Due to illness or injury, pt requires aid of supportive devices such as crutches, cane, wheelchairs, walkers, the use of special transportation or the assistance of another person to leave their place of residence. There is a normal inability to leave the home and doing so requires considerable and taxing effort. Other absences are for medical reasons / religious services and are infrequent or of short duration when for other reasons). If current dressing causes regression in wound condition, may D/C ordered dressing product/s and apply Normal Saline Moist Dressing daily until next [place] / Other MD appointment. [place] of regression in wound condition at [phone]. Please direct any NON-WOUND related issues/requests for orders to patient's Primary Care Physician Wound #4 Right Calcaneus: [place] Nurse may visit PRN to address patient s wound care needs. FACE TO FACE ENCOUNTER: MEDICARE and MEDICAID PATIENTS: I certify that this patient is under my care and that I had a face-to-face encounter that meets the physician face-to-face encounter requirements with this patient on this date. The encounter with the patient was in whole or in part for the following MEDICAL CONDITION: (primary reason for [place]) MEDICAL NECESSITY: I certify, that based on my findings, NURSING services are a medically necessary home health service. HOME BOUND STATUS: I certify that my clinical findings support that  this patient is homebound (i.e., Due to illness or injury, pt requires aid of supportive devices such as crutches, cane, wheelchairs, walkers, the use of special transportation or the assistance of another person to leave their place of residence. There is a normal  inability to leave the home and doing so requires considerable and taxing effort. Other absences are for medical reasons / religious services and are infrequent or of short duration when for other reasons). If current dressing causes regression in wound condition, may D/C ordered dressing product/s and apply Normal Saline Moist Dressing daily until next [place] / Other MD appointment. [place] of regression in wound condition at [phone]. Please direct any NON-WOUND related issues/requests for orders to patient's Primary Care Physician Wound #6 Right,Lateral,Posterior Lower Leg: [place] Nurse may visit PRN to address patient s wound care needs. FACE TO FACE ENCOUNTER: MEDICARE and MEDICAID PATIENTS: I certify that this patient is under my care and that I had a face-to-face encounter that meets the physician face-to-face encounter requirements with this patient on this date. The encounter with the patient was in whole or in part for the [place]. ([id number]) following MEDICAL CONDITION: (primary reason for [place]) MEDICAL NECESSITY: I certify, that based on my findings, NURSING services are a medically necessary home health service. HOME BOUND STATUS: I certify that my clinical findings support that this patient is homebound (i.e., Due to illness or injury, pt requires aid of supportive devices such as crutches, cane, wheelchairs, walkers, the use of special transportation or the assistance of another person to leave their place of residence. There is a normal inability to leave the home and doing so requires considerable and taxing effort. Other absences are for medical reasons / religious services and are infrequent or of short duration when for other reasons). If current dressing causes regression in wound condition, may D/C ordered dressing product/s and apply Normal Saline Moist Dressing daily until next [place] / Other MD appointment. [place] of regression in wound condition at [phone]. Please direct any NON-WOUND related issues/requests for orders to patient's Primary Care Physician Wound #7 Right,Lateral Malleolus: [place] Nurse may visit PRN to address patient s wound care needs. FACE TO FACE ENCOUNTER: MEDICARE and MEDICAID PATIENTS: I certify that this patient is under my care and that I had a face-to-face encounter that meets the physician face-to-face encounter requirements with this patient on this date. The encounter with the patient was in whole or in part for the following MEDICAL CONDITION: (primary reason for [place]) MEDICAL NECESSITY: I certify, that based on my findings, NURSING services are a medically necessary home health service. HOME BOUND STATUS: I certify that my clinical findings support that this patient is homebound (i.e., Due to illness or injury, pt requires aid of supportive devices such as crutches, cane, wheelchairs, walkers, the use of special transportation or the assistance of another person to leave their place of residence. There is a normal inability to leave the home and doing so requires considerable and taxing effort. Other absences are for medical reasons / religious services and are infrequent or of short duration when for other reasons). If current dressing causes regression in wound condition, may D/C ordered dressing product/s and apply Normal Saline Moist Dressing daily until next [place] / Other MD appointment. [place] of regression in wound condition at [phone]. Please direct any  NON-WOUND related issues/requests for orders to patient's Primary Care Physician Medications-please add to medication list.: Wound #1 Left,Dorsal Foot: Other: - Vitamin C, Zinc, MVI Wound #2 Right,Dorsal Foot: Other: - Vitamin C, Zinc, MVI Wound #3 Left  Calcaneus: Other: - Vitamin C, Zinc, MVI Wound #4 Right Calcaneus: Other: - Vitamin C, Zinc, MVI Wound #6 Right,Lateral,Posterior Lower Leg: Other: - Vitamin C, Zinc, MVI Wound #7 Right,Lateral Malleolus: Other: - Vitamin C, Zinc, MVI Luscher, Eiliana R. ([id number]) o o #1 this patient will have her MRI of the left foot and ankle on Thursday. We should have a better idea about the status of the underlying osteomyelitis identified by her plain x-ray #2 I have not changed the primary dressings which includes Iodoflex to the dorsal at foot and ankles, Medihoney to the left heel and right heel and Prisma to the 2 wounds on the lateral left leg superiorly and inferiorly #3 the patient is nonambulatory and therefore aggressive care for osteomyelitis versus amputation as a curative approach is something that the patient will have to decide. #4 the area on the dorsal left foot has widely exposed tendon and not much in the way of nonviable tissue the chances of healing this are not high. She also has a deep necrotic eschar over the left heel, the status of the tissue under here is not known Engineer, maintenance) Signed: 01/25/2017 4:52:46 PM By: Gretta Cool RN, BSN, Kim RN, BSN Signed: 01/26/2017 7:47:19 AM By: Linton Ham MD Entered By: Gretta Cool, RN, BSN, Kim on 01/25/2017 16:52:46 SHARLON, SCHUELER ([id number]) -------------------------------------------------------------------------------- SuperBill Details Patient Name: [name] Date of Service: 01/25/2017 Medical Record Patient Account Number: [id number] Number: Treating RN: Ahmed Prima 16-Dec-1933 (83 y.o. 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 L97.515 evidence of necrosis L89.613 Pressure ulcer of right heel, stage 3 L89.620 Pressure ulcer of left heel, unstageable 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 Facility Procedures CPT4: Description Modifier Quantity Code [id number] - DEB SUBQ TISSUE 20 SQ CM/< 1 ICD-10 Description Diagnosis L97.525 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

## 2017-01-27 ENCOUNTER — Ambulatory Visit
Admission: RE | Admit: 2017-01-27 | Discharge: 2017-01-27 | Disposition: A | Discharge status: Home / Self Care | Payer: Medicare Other | Source: Ambulatory Visit | Attending: Internal Medicine | Admitting: Internal Medicine

## 2017-01-27 DIAGNOSIS — M869 Osteomyelitis, unspecified: Secondary | ICD-10-CM

## 2017-01-27 DIAGNOSIS — E11621 Type 2 diabetes mellitus with foot ulcer: Secondary | ICD-10-CM | POA: Diagnosis present

## 2017-01-27 DIAGNOSIS — L97525 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 without evidence of necrosis: Secondary | ICD-10-CM

## 2017-01-27 DIAGNOSIS — L97529 Non-pressure chronic ulcer of other part of left foot with unspecified severity: Secondary | ICD-10-CM | POA: Insufficient documentation

## 2017-01-27 DIAGNOSIS — L97509 Non-pressure chronic ulcer of other part of unspecified foot with unspecified severity: Secondary | ICD-10-CM | POA: Diagnosis present

## 2017-01-27 DIAGNOSIS — M609 Myositis, unspecified: Secondary | ICD-10-CM | POA: Diagnosis not present

## 2017-01-27 DIAGNOSIS — M868X0 Other osteomyelitis, multiple sites: Secondary | ICD-10-CM | POA: Diagnosis not present

## 2017-01-27 DIAGNOSIS — L03116 Cellulitis of left lower limb: Secondary | ICD-10-CM | POA: Diagnosis not present

## 2017-02-01 ENCOUNTER — Ambulatory Visit (INDEPENDENT_AMBULATORY_CARE_PROVIDER_SITE_OTHER): Payer: Medicare Other | Admitting: Vascular Surgery

## 2017-02-01 ENCOUNTER — Encounter: Payer: Medicare Other | Admitting: Internal Medicine

## 2017-02-01 DIAGNOSIS — E11621 Type 2 diabetes mellitus with foot ulcer: Secondary | ICD-10-CM | POA: Diagnosis not present

## 2017-02-02 NOTE — Progress Notes (Signed)
HANI, BIALY ([id number]) Visit Report for 02/01/2017 Arrival Information Details Patient Name: [name], [name] Date of Service: 02/01/2017 12:30 PM Medical Record Patient Account Number: [id number] Number: Treating RN: Ahmed Prima [birth year] (83 y.o. Other Clinician: Date of Birth/Sex: Female) Treating ROBSON, Jette Primary Care Tim Wilhide: Emily Filbert Ambar Raphael/Extender: G Referring Ervan Heber: Melina Modena in Treatment: 4 Visit Information History Since Last Visit All ordered tests and consults were completed: No Patient Arrived: Wheel Chair Added or deleted any medications: No Arrival Time: 12:37 Any new allergies or adverse reactions: No Accompanied By: husband Had a fall or experienced change in No activities of daily living that may affect Transfer Assistance: Harrel Lemon Lift risk of falls: Patient Identification Verified: Yes Signs or symptoms of abuse/neglect since last No Secondary Verification Process Yes visito Completed: Has Dressing in Place as Prescribed: Yes Patient Requires Transmission-Based No Has Compression in Place as Prescribed: Yes Precautions: Pain Present Now: No Patient Has Alerts: Yes Patient Alerts: DM II Electronic Signature(s) Signed: 02/01/2017 4:45:40 PM By: Alric Quan Entered By: Alric Quan on 02/01/2017 12:38:00 [name] ([id number]) -------------------------------------------------------------------------------- Encounter Discharge Information Details Patient Name: [name] Date of Service: 02/01/2017 12:30 PM Medical Record Patient Account Number: [id number] Number: Treating RN: Ahmed Prima 03-Aug-1934 (83 y.o. Other Clinician: Date of Birth/Sex: Female) Treating ROBSON, MICHAEL Primary Care Aalani Aikens: Emily Filbert Andrya Roppolo/Extender: G Referring Mitchel Delduca: Melina Modena in Treatment: 4 Encounter Discharge Information Items Discharge Pain Level: 0 Discharge Condition:  Stable Ambulatory Status: Wheelchair Discharge Destination: Home Transportation: Private Auto Accompanied By: husband Schedule Follow-up Appointment: Yes Medication Reconciliation completed and provided to Patient/Care No Rubina Basinski: Provided on Clinical Summary of Care: 02/01/2017 Form Type Recipient Paper Patient MB Electronic Signature(s) Signed: 02/01/2017 1:59:47 PM By: Ruthine Dose Entered By: Ruthine Dose on 02/01/2017 13:59:46 [name] ([id number]) -------------------------------------------------------------------------------- Lower Extremity Assessment Details Patient Name: [name] Date of Service: 02/01/2017 12:30 PM Medical Record Patient Account Number: [id number] Number: Treating RN: Ahmed Prima 06-Jul-1934 (83 y.o. Other Clinician: Date of Birth/Sex: Female) Treating ROBSON, [place] Primary Care Isamar Wellbrock: Emily Filbert Norris Brumbach/Extender: G Referring Allyce Bochicchio: Melina Modena in Treatment: 4 Edema Assessment Assessed: [Left: No] [Right: No] E[Left: dema] [Right: :] Calf Left: Right: Point of Measurement: 31 cm From Medial Instep 41.8 cm 37.8 cm Ankle Left: Right: Point of Measurement: 8 cm From Medial Instep 20.4 cm 29.8 cm Vascular Assessment Pulses: Posterior Tibial Extremity colors, hair growth, and conditions: Extremity Color: [Left:Hyperpigmented] [Right:Hyperpigmented] Temperature of Extremity: [Left:Warm] [Right:Warm] Capillary Refill: [Left:< 3 seconds] [Right:< 3 seconds] Electronic Signature(s) Signed: 02/01/2017 4:45:40 PM By: Alric Quan Entered By: Alric Quan on 02/01/2017 13:07:17 [name] ([id number]) -------------------------------------------------------------------------------- Multi Wound Chart Details Patient Name: [name] Date of Service: 02/01/2017 12:30 PM Medical Record Patient Account Number: [id number] Number: Treating RN: Ahmed Prima 03-Jul-1934 (83 y.o. Other  Clinician: Date of Birth/Sex: Female) Treating ROBSON, MICHAEL Primary Care Janda Cargo: Emily Filbert Adeleigh Barletta/Extender: G Referring Zaydan Papesh: Melina Modena in Treatment: 4 Vital Signs Height(in): 61 Pulse(bpm): 78 Weight(lbs): 206 Blood Pressure 97/52 (mmHg): Body Mass Index(BMI): 39 Temperature(F): 97.8 Respiratory Rate 16 (breaths/min): Photos: [1:No Photos] [2:No Photos] [3:No Photos] Wound Location: [1:Left Foot - Dorsal] [2:Right Foot - Dorsal] [3:Left Calcaneus] Wounding Event: [1:Gradually Appeared] [2:Gradually Appeared] [3:Pressure Injury] Primary Etiology: [1: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] [2:the Lower Extremity] [3:Pressure Ulcer] Comorbid History: [1:Sleep Apnea, Arrhythmia, Sleep Apnea, Arrhythmia, Hypertension, Cirrhosis , Hypertension, Cirrhosis , Type II Diabetes, End Stage Renal Disease,  Gout, Osteoarthritis] [2:Type II Diabetes, End Stage Renal Disease, Gout,  Osteoarthritis] [3:Sleep Apnea, Arrhythmia, Hypertension, Cirrhosis , Type II Diabetes, End Stage Renal Disease, Gout, Osteoarthritis] Date Acquired: [1:11/04/2016] [2:11/04/2016] [3:11/04/2016] Weeks of Treatment: [1:4] [2:4] [3:4] Wound Status: [1:Open] [2:Open] [3:Open] Measurements L x W x D 3.8x5.4x0.4 [2:5.2x3.5x0.3] [3:3.5x8.4x0.5] (cm) Area (cm) : [1:16.116] [2:14.294] [3:23.091] Volume (cm) : [1:6.447] [2:4.288] [3:11.545] % Reduction in Area: [1:-14.00%] [2:24.20%] [3:18.30%] % Reduction in Volume: -128.10% [2:-13.70%] [3:-308.40%] Undermining: [1:No] [2:No] [3:No] Classification: [1:Grade 1] [2:Grade 2] [3:Category/Stage II] HBO Classification: [1:N/A] [2:N/A] [3:Grade 1] Exudate Amount: [1:Medium] [2:Large] [3:Large] Exudate Type: [1:Purulent] [2:Purulent] [3:Serous] Exudate Color: [1:yellow, brown, green] [2:yellow, brown, green] [3:amber] Wound Margin: [1:Well defined, not attached Distinct, outline attached] [3:Distinct, outline  attached] Granulation Amount: [1:Medium (34-66%)] [2:Medium (34-66%)] [3:None Present (0%)] Granulation Quality: [1:Red, Pink] [2:Pink, Hyper-granulation] [3:N/A] Necrotic Amount: Medium (34-66%) Medium (34-66%) Large (67-100%) Necrotic Tissue: Eschar, Adherent Slough Eschar, Adherent Slough Eschar Exposed Structures: Fat Layer (Subcutaneous Fat Layer (Subcutaneous N/A Tissue) Exposed: Yes Tissue) Exposed: Yes Fascia: No Fascia: No Tendon: No Tendon: No Muscle: No Muscle: No Joint: No Joint: No Bone: No Bone: No Epithelialization: None None None Periwound Skin Texture: Excoriation: Yes No Abnormalities Noted No Abnormalities Noted Induration: Yes Callus: Yes Crepitus: Yes Rash: Yes Scarring: Yes Periwound Skin Maceration: Yes Maceration: Yes No Abnormalities Noted Moisture: Dry/Scaly: Yes Periwound Skin Color: Atrophie Blanche: Yes Erythema: Yes Erythema: Yes Cyanosis: Yes Ecchymosis: Yes Erythema: Yes Hemosiderin Staining: Yes Mottled: Yes Pallor: Yes Rubor: Yes Erythema Location: N/A Circumferential Circumferential Temperature: N/A No Abnormality No Abnormality Tenderness on No Yes Yes Palpation: Wound Preparation: Ulcer Cleansing: Other: Ulcer Cleansing: Ulcer Cleansing: soap and water Rinsed/Irrigated with Rinsed/Irrigated with Saline Saline Topical Anesthetic Applied: Other: lidocaine Topical Anesthetic Topical Anesthetic 4% Applied: Other: Applied: Other: LIDOCAINE 4% LIDOCAINE 4% Wound Number: [id number]  Photos: No Photos No Photos No Photos Wound Location: Right Calcaneus Right Lower Leg - Lateral, Right Malleolus - Lateral Posterior Wounding Event: Pressure Injury Gradually Appeared Gradually Appeared Primary Etiology: Pressure Ulcer 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409 0.236 Volume (cm) : 3.845 10.895 0.047 % Reduction in Area: 67.40% 37.20% 53.10% % Reduction in Volume: 2.10% 28.90% 68.90% Starting Position 1 1 (o'clock): Ending Position 1 11 (o'clock): Maximum Distance 1 0.3 (cm): Undermining: Yes No No Classification: Category/Stage II Grade 2 Grade 2 HBO Classification: Grade 1 N/A N/A Exudate Amount: Large Large Large Exudate Type: Purulent Serosanguineous Serous Exudate Color: yellow, brown, green red, brown amber Wound Margin: Distinct, outline attached Distinct, outline attached Distinct, outline attached Granulation Amount: None Present (0%) None Present (0%) None Present (0%) Granulation Quality: N/A N/A N/A Necrotic Amount: Large (67-100%) Large (67-100%) Large (67-100%) Necrotic Tissue: Eschar, Adherent [place] Exposed Structures: N/A N/A N/A Epithelialization: None None None Periwound Skin Texture: No Abnormalities Noted No Abnormalities Noted No Abnormalities Noted Periwound Skin No Abnormalities Noted Maceration: Yes No Abnormalities Noted Moisture: Periwound Skin Color: Erythema: Yes Erythema: Yes No Abnormalities  Noted Erythema Location: Circumferential Circumferential N/A Temperature: No Abnormality No Abnormality N/A Tenderness on Yes Yes No Palpation: Wound Preparation: Ulcer Cleansing: Ulcer Cleansing: Ulcer Cleansing: Rinsed/Irrigated with Rinsed/Irrigated with Rinsed/Irrigated with Saline Saline, Other: soap and Saline water Topical  Anesthetic Topical Anesthetic Applied: Other: Topical Anesthetic Applied: Other: lidocaine LIDOCAINE 4% Applied: Other: 4% LIDOCAINE 4% Wound Number: [id number] N/A Photos: No Photos No Photos N/A Wound Location: Left Lower Leg - Lateral, Left Metatarsal head first - N/A Posterior Lateral MADIHA, CISSEL ([id number]) Wounding Event: Gradually Appeared Gradually Appeared N/A 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 Comorbid History: Sleep Apnea, Arrhythmia, Sleep Apnea, Arrhythmia, N/A Hypertension, Cirrhosis , Hypertension, Cirrhosis , Type II Diabetes, End Type II Diabetes, End Stage Renal Disease, Stage Renal Disease, Gout, Osteoarthritis Gout, Osteoarthritis Date Acquired: 01/10/2017 02/01/2017 N/A Weeks of Treatment: 2 0 N/A Wound Status: Healed - Epithelialized Open N/A Measurements L x W x D 0x0x0 0.4x0.3x0.1 N/A (cm) Area (cm) : 0 0.094 N/A Volume (cm) : 0 0.009 N/A % Reduction in Area: 100.00% N/A N/A % Reduction in Volume: 100.00% N/A N/A Undermining: No N/A N/A Classification: Partial Thickness Grade 2 N/A HBO Classification: Grade 0 N/A N/A Exudate Amount: None Present Large N/A Exudate Type: N/A Serous N/A Exudate Color: N/A amber N/A Wound Margin: Distinct, outline attached Distinct, outline attached N/A Granulation Amount: None Present (0%) Medium (34-66%) N/A Granulation Quality: N/A Red, Pink N/A Necrotic Amount: None Present (0%) Medium (34-66%) N/A Necrotic Tissue: N/A Adherent Slough N/A Exposed Structures: Fascia: No N/A N/A Fat Layer (Subcutaneous Tissue) Exposed: No Tendon: No Muscle: No Joint: No Bone: No Limited to Skin Breakdown Epithelialization: N/A N/A N/A Periwound Skin Texture: No Abnormalities Noted No Abnormalities Noted N/A Periwound Skin No Abnormalities Noted Maceration: Yes N/A Moisture: Periwound Skin Color: No Abnormalities Noted No Abnormalities Noted N/A Erythema Location: N/A N/A  N/A Temperature: N/A N/A N/A Tenderness on No Yes N/A Palpation: Wound Preparation: Ulcer Cleansing: Ulcer Cleansing: N/A Rinsed/Irrigated with Rinsed/Irrigated with Saline Saline, Other: soap and Nunnery, Letetia R. ([id number]) water Topical Anesthetic Applied: None Topical Anesthetic Applied: Other: lidocaine 4% Treatment Notes Wound #1 (Left, Dorsal Foot) 1. Cleansed with: Clean wound with Normal Saline 2. Anesthetic Topical Lidocaine 4% cream to wound bed prior to debridement 4. Dressing Applied: Iodoflex 5. Secondary Dressing Applied ABD Pad Dry Gauze 7. Secured with Tape 3 Layer Compression System - Bilateral Wound #2 (Right, Dorsal Foot) 1. Cleansed with: Clean wound with Normal Saline 2. Anesthetic Topical Lidocaine 4% cream to wound bed prior to debridement 4. Dressing Applied: Iodoflex 5. Secondary Dressing Applied ABD Pad Dry Gauze 7. Secured with Tape 3 Layer Compression System - Bilateral Wound #3 (Left Calcaneus) 1. Cleansed with: Clean wound with Normal Saline 2. Anesthetic Topical Lidocaine 4% cream to wound bed prior to debridement 4. Dressing Applied: Medihoney Gel 5. Secondary Dressing Applied Dry Gauze Notes heel cup Libman, Massie R. ([id number]) Wound #4 (Right Calcaneus) 1. Cleansed with: Clean wound with Normal Saline 2. Anesthetic Topical Lidocaine 4% cream to wound bed prior to debridement 4. Dressing Applied: Medihoney Gel 5. Secondary Dressing Applied Dry Gauze Notes heel cup Wound #6 (Right, Lateral, Posterior Lower Leg) 1. Cleansed with: Clean wound with Normal Saline 2. Anesthetic Topical Lidocaine 4% cream to wound bed prior to debridement 4. Dressing Applied: Prisma Ag 5. Secondary Dressing Applied Bordered Foam Dressing Dry Gauze Wound #7 (Right, Lateral Malleolus) 1. Cleansed with: Clean wound with Normal Saline Cleanse wound with  antibacterial soap and water 2. Anesthetic Topical Lidocaine 4% cream to wound bed  prior to debridement 4. Dressing Applied: Prisma Ag 5. Secondary Dressing Applied ABD Pad Dry Gauze 7. Secured with Tape 3 Layer Compression System - Bilateral Wound #9 (Left, Lateral Metatarsal head first) 1. Cleansed with: Clean wound with Normal Saline Cleanse wound with antibacterial soap and water 2. Anesthetic Topical Lidocaine 4% cream to wound bed prior to debridement 4. Dressing Applied: HALANA, HAEFFNER ([id number]) Prisma Ag 5. Secondary Dressing Applied ABD Pad Dry Gauze 7. Secured with Tape 3 Layer Compression System - Bilateral Electronic Signature(s) Signed: 02/02/2017 9:58:44 AM By: Linton Ham MD Entered By: Linton Ham on 02/01/2017 14:42:28 ELEINA, GONG ([id number]) -------------------------------------------------------------------------------- Multi-Disciplinary Care Plan Details Patient Name: [name] Date of Service: 02/01/2017 12:30 PM Medical Record Patient Account Number: [id number] Number: Treating RN: Ahmed Prima 26-Feb-1934 (83 y.o. Other Clinician: Date of Birth/Sex: Female) Treating ROBSON, [place] Primary Care Jamariya Davidoff: Emily Filbert Neytiri Asche/Extender: G Referring Felesia Stahlecker: Melina Modena in Treatment: 4 Active Inactive ` Abuse / Safety / Falls / Self Care Management Nursing Diagnoses: Potential for falls Goals: Patient will remain injury free Date Initiated: 01/04/2017 Target Resolution Date: 02/24/2017 Goal Status: Active Interventions: Assess fall risk on admission and as needed Assess self care needs on admission and as needed Notes: ` Nutrition Nursing Diagnoses: Imbalanced nutrition Impaired glucose control: actual or potential Potential for alteratiion in Nutrition/Potential for imbalanced nutrition Goals: Patient/caregiver agrees to and verbalizes understanding of need to use nutritional supplements and/or vitamins as prescribed Date Initiated: 01/04/2017 Target Resolution Date:  02/24/2017 Goal Status: Active Interventions: Assess patient nutrition upon admission and as needed per policy Notes: DARASIMI, WALP ([id number]) Orientation to the Wound Care Program Nursing Diagnoses: Knowledge deficit related to the wound healing center program Goals: Patient/caregiver will verbalize understanding of the [facility] Date Initiated: 01/04/2017 Target Resolution Date: 02/24/2017 Goal Status: Active Interventions: Provide education on orientation to the wound center Notes: ` Pain, Acute or Chronic Nursing Diagnoses: Pain, acute or chronic: actual or potential Potential alteration in comfort, pain Goals: Patient will verbalize adequate pain control and receive pain control interventions during procedures as needed Date Initiated: 01/04/2017 Target Resolution Date: 02/24/2017 Goal Status: Active Interventions: Assess comfort goal upon admission Complete pain assessment as per visit requirements Notes: ` Wound/Skin Impairment Nursing Diagnoses: Impaired tissue integrity Goals: Ulcer/skin breakdown will have a volume reduction of 80% by week 12 Date Initiated: 01/04/2017 Target Resolution Date: 02/24/2017 Goal Status: Active Interventions: Assess patient/caregiver ability to perform ulcer/skin care regimen upon admission and as needed EMMANUELA, PERRINS ([id number]) Notes: Electronic Signature(s) Signed: 02/01/2017 4:45:40 PM By: Alric Quan Entered By: Alric Quan on 02/01/2017 13:08:24 [name] ([id number]) -------------------------------------------------------------------------------- Pain Assessment Details Patient Name: [name] Date of Service: 02/01/2017 12:30 PM Medical Record Patient Account Number: [id number] Number: Treating RN: Ahmed Prima 1934/05/17 (83 y.o. Other Clinician: Date of Birth/Sex: Female) Treating ROBSON, MICHAEL Primary Care Koleman Marling: Emily Filbert Garold Sheeler/Extender: G Referring  Elysa Womac: Melina Modena in Treatment: 4 Active Problems Location of Pain Severity and Description of Pain Patient Has Paino No Site Locations With Dressing Change: No Pain Management and Medication Current Pain Management: Electronic Signature(s) Signed: 02/01/2017 4:45:40 PM By: Alric Quan Entered By: Alric Quan on 02/01/2017 12:38:06 [name] ([id number]) -------------------------------------------------------------------------------- Patient/Caregiver Education Details Patient Name: [name] Date of Service: 02/01/2017 12:30 PM Medical Record Patient Account Number: [id number] Number: Treating RN: Ahmed Prima  [birth year] (83 y.o. Other Clinician: Date of Birth/Gender: Female) Treating ROBSON, [place] Primary Care Physician: Emily Filbert Physician/Extender: G Referring Physician: Melina Modena in Treatment: 4 Education Assessment Education Provided To: Patient Education Topics Provided Wound/Skin Impairment: Handouts: Other: change dressing as ordered and do not get dressing wet Methods: Demonstration, Explain/Verbal Responses: State content correctly Electronic Signature(s) Signed: 02/01/2017 4:45:40 PM By: Alric Quan Entered By: Alric Quan on 02/01/2017 13:09:08 [name] ([id number]) -------------------------------------------------------------------------------- Wound Assessment Details Patient Name: [name] Date of Service: 02/01/2017 12:30 PM Medical Record Patient Account Number: [id number] Number: Treating RN: Ahmed Prima 1933/12/27 (83 y.o. Other Clinician: Date of Birth/Sex: Female) Treating ROBSON, [place] Primary Care Devra Stare: Emily Filbert Tessia Kassin/Extender: G Referring Rosellen Lichtenberger: Melina Modena in Treatment: 4 Wound Status Wound Number: 1 Primary Diabetic Wound/Ulcer of the Lower Etiology: Extremity Wound Location: Left Foot - Dorsal Wound Open Wounding Event:  Gradually Appeared Status: Date Acquired: 11/04/2016 Comorbid Sleep Apnea, Arrhythmia, Hypertension, Weeks Of Treatment: 4 History: Cirrhosis , Type II Diabetes, End Stage Clustered Wound: No Renal Disease, Gout, Osteoarthritis Photos Photo Uploaded By: Alric Quan on 02/01/2017 16:52:11 Wound Measurements Length: (cm) 3.8 Width: (cm) 5.4 Depth: (cm) 0.4 Area: (cm) 16.116 Volume: (cm) 6.447 % Reduction in Area: -14% % Reduction in Volume: -128.1% Epithelialization: None Tunneling: No Undermining: No Wound Description Classification: Grade 1 Foul Odor Aft Wound Margin: Well defined, not attached Slough/Fibrin Exudate Amount: Medium Exudate Type: Purulent Exudate Color: yellow, brown, green er Cleansing: No o Yes Wound Bed Granulation Amount: Medium (34-66%) Exposed Structure Granulation Quality: Red, Pink Fascia Exposed: No Diggins, Kairah R. ([id number]) Necrotic Amount: Medium (34-66%) Fat Layer (Subcutaneous Tissue) Exposed: Yes Necrotic Quality: Eschar, Adherent Slough Tendon Exposed: No Muscle Exposed: No Joint Exposed: No Bone Exposed: No Periwound Skin Texture Texture Color No Abnormalities Noted: No No Abnormalities Noted: No Callus: Yes Atrophie Blanche: Yes Crepitus: Yes Cyanosis: Yes Excoriation: Yes Ecchymosis: Yes Induration: Yes Erythema: Yes Rash: Yes Hemosiderin Staining: Yes Scarring: Yes Mottled: Yes Pallor: Yes Moisture Rubor: Yes No Abnormalities Noted: No Dry / Scaly: Yes Maceration: Yes Wound Preparation Ulcer Cleansing: Other: soap and water, Topical Anesthetic Applied: Other: lidocaine 4%, Treatment Notes Wound #1 (Left, Dorsal Foot) 1. Cleansed with: Clean wound with Normal Saline 2. Anesthetic Topical Lidocaine 4% cream to wound bed prior to debridement 4. Dressing Applied: Iodoflex 5. Secondary Dressing Applied ABD Pad Dry Gauze 7. Secured with Tape 3 Layer Compression System - Bilateral Electronic  Signature(s) Signed: 02/01/2017 4:45:40 PM By: Alric Quan Entered By: Alric Quan on 02/01/2017 12:57:38 [name] ([id number]) -------------------------------------------------------------------------------- Wound Assessment Details Patient Name: [name] Date of Service: 02/01/2017 12:30 PM Medical Record Patient Account Number: [id number] Number: Treating RN: Ahmed Prima 22-Apr-1934 (83 y.o. Other Clinician: Date of Birth/Sex: Female) Treating ROBSON, [place] Primary Care Shuntay Everetts: Emily Filbert Naftuli Dalsanto/Extender: G Referring Vangie Henthorn: Melina Modena in Treatment: 4 Wound Status Wound Number: 2 Primary Diabetic Wound/Ulcer of the Lower Etiology: Extremity Wound Location: Right Foot - Dorsal Wound Open Wounding Event: Gradually Appeared Status: Date Acquired: 11/04/2016 Comorbid Sleep Apnea, Arrhythmia, Hypertension, Weeks Of Treatment: 4 History: Cirrhosis , Type II Diabetes, End Stage Clustered Wound: No Renal Disease, Gout, Osteoarthritis Photos Photo Uploaded By: Alric Quan on 02/01/2017 16:52:12 Wound Measurements Length: (cm) 5.2 Width: (cm) 3.5 Depth: (cm) 0.3 Area: (cm) 14.294 Volume: (cm) 4.288 % Reduction in Area: 24.2% % Reduction in Volume: -13.7% Epithelialization: None Tunneling: No Undermining: No Wound Description Classification: Grade 2 Foul Odor Aft Wound  Margin: Distinct, outline attached Slough/Fibrin Exudate Amount: Large Exudate Type: Purulent Exudate Color: yellow, brown, green er Cleansing: No o Yes Wound Bed Granulation Amount: Medium (34-66%) Exposed Structure Granulation Quality: Pink, Hyper-granulation Fascia Exposed: No Sansom, Taylee R. ([id number]) Necrotic Amount: Medium (34-66%) Fat Layer (Subcutaneous Tissue) Exposed: Yes Necrotic Quality: Eschar, Adherent Slough Tendon Exposed: No Muscle Exposed: No Joint Exposed: No Bone Exposed: No Periwound Skin Texture Texture Color No  Abnormalities Noted: No No Abnormalities Noted: No Erythema: Yes Moisture Erythema Location: Circumferential No Abnormalities Noted: No Maceration: Yes Temperature / Pain Temperature: No Abnormality Tenderness on Palpation: Yes Wound Preparation Ulcer Cleansing: Rinsed/Irrigated with Saline Topical Anesthetic Applied: Other: LIDOCAINE 4%, Treatment Notes Wound #2 (Right, Dorsal Foot) 1. Cleansed with: Clean wound with Normal Saline 2. Anesthetic Topical Lidocaine 4% cream to wound bed prior to debridement 4. Dressing Applied: Iodoflex 5. Secondary Dressing Applied ABD Pad Dry Gauze 7. Secured with Tape 3 Layer Compression System - Bilateral Electronic Signature(s) Signed: 02/01/2017 4:45:40 PM By: Alric Quan Entered By: Alric Quan on 02/01/2017 12:59:57 [name] ([id number]) -------------------------------------------------------------------------------- Wound Assessment Details Patient Name: [name] Date of Service: 02/01/2017 12:30 PM Medical Record Patient Account Number: [id number] Number: Treating RN: Ahmed Prima 31-Oct-1934 (83 y.o. Other Clinician: Date of Birth/Sex: Female) Treating ROBSON, [place] Primary Care Artha Chiasson: Emily Filbert Nakota Ackert/Extender: G Referring Dailen Mcclish: Melina Modena in Treatment: 4 Wound Status Wound Number: 3 Primary Pressure Ulcer Etiology: Wound Location: Left Calcaneus Wound Open Wounding Event: Pressure Injury Status: Date Acquired: 11/04/2016 Comorbid Sleep Apnea, Arrhythmia, Hypertension, Weeks Of Treatment: 4 History: Cirrhosis , Type II Diabetes, End Stage Clustered Wound: No Renal Disease, Gout, Osteoarthritis Photos Photo Uploaded By: Alric Quan on 02/01/2017 16:52:38 Wound Measurements Length: (cm) 3.5 Width: (cm) 8.4 Depth: (cm) 0.5 Area: (cm) 23.091 Volume: (cm) 11.545 % Reduction in Area: 18.3% % Reduction in Volume: -308.4% Epithelialization:  None Tunneling: No Undermining: No Wound Description Classification: Category/Stage II Diabetic Severity Earleen Newport): Grade 1 Wound Margin: Distinct, outline attach Exudate Amount: Large Exudate Type: Serous Exudate Color: amber Foul Odor After Cleansing: No Slough/Fibrino No ed Wound Bed Granulation Amount: None Present (0%) Harling, Yeraldy R. ([id number]) Necrotic Amount: Large (67-100%) Necrotic Quality: Eschar Periwound Skin Texture Texture Color No Abnormalities Noted: No No Abnormalities Noted: No Erythema: Yes Moisture Erythema Location: Circumferential No Abnormalities Noted: No Temperature / Pain Temperature: No Abnormality Tenderness on Palpation: Yes Wound Preparation Ulcer Cleansing: Rinsed/Irrigated with Saline Topical Anesthetic Applied: Other: LIDOCAINE 4%, Treatment Notes Wound #3 (Left Calcaneus) 1. Cleansed with: Clean wound with Normal Saline 2. Anesthetic Topical Lidocaine 4% cream to wound bed prior to debridement 4. Dressing Applied: Medihoney Gel 5. Secondary Dressing Applied Dry Gauze Notes heel cup Electronic Signature(s) Signed: 02/01/2017 4:45:40 PM By: Alric Quan Entered By: Alric Quan on 02/01/2017 12:58:29 [name] ([id number]) -------------------------------------------------------------------------------- Wound Assessment Details Patient Name: [name] Date of Service: 02/01/2017 12:30 PM Medical Record Patient Account Number: [id number] Number: Treating RN: Ahmed Prima 10/30/1934 (83 y.o. Other Clinician: Date of Birth/Sex: Female) Treating ROBSON, MICHAEL Primary Care Asuzena Weis: Emily Filbert Ruchel Brandenburger/Extender: G Referring Darl Kuss: Melina Modena in Treatment: 4 Wound Status Wound Number: 4 Primary Pressure Ulcer Etiology: Wound Location: Right Calcaneus Wound Open Wounding Event: Pressure Injury Status: Date Acquired: 11/04/2016 Comorbid Sleep Apnea, Arrhythmia, Hypertension, Weeks  Of Treatment: 4 History: Cirrhosis , Type II Diabetes, End Stage Clustered Wound: No Renal Disease, Gout, Osteoarthritis Photos Photo Uploaded By: Alric Quan on 02/01/2017 16:52:39 Wound Measurements Length: (cm)  4.8 Width: (cm) 1.7 Depth: (cm) 0.6 Area: (cm) 6.409 Volume: (cm) 3.845 % Reduction in Area: 67.4% % Reduction in Volume: 2.1% Epithelialization: None Tunneling: No Undermining: Yes Starting Position (o'clock): 1 Ending Position (o'clock): 11 Maximum Distance: (cm) 0.3 Wound Description Classification: Category/Stage II Diabetic Severity (Wagner): Grade 1 Wound Margin: Distinct, outline attache Exudate Amount: Large Exudate Type: Purulent SALLYE, HENNER R. ([id number]) Foul Odor After Cleansing: No Slough/Fibrino No d Exudate Color: yellow, brown, green Wound Bed Granulation Amount: None Present (0%) Necrotic Amount: Large (67-100%) Necrotic Quality: Eschar, Adherent Slough Periwound Skin Texture Texture Color No Abnormalities Noted: No No Abnormalities Noted: No Erythema: Yes Moisture Erythema Location: Circumferential No Abnormalities Noted: No Temperature / Pain Temperature: No Abnormality Tenderness on Palpation: Yes Wound Preparation Ulcer Cleansing: Rinsed/Irrigated with Saline Topical Anesthetic Applied: Other: LIDOCAINE 4%, Treatment Notes Wound #4 (Right Calcaneus) 1. Cleansed with: Clean wound with Normal Saline 2. Anesthetic Topical Lidocaine 4% cream to wound bed prior to debridement 4. Dressing Applied: Medihoney Gel 5. Secondary Dressing Applied Dry Gauze Notes heel cup Electronic Signature(s) Signed: 02/01/2017 4:45:40 PM By: Alric Quan Entered By: Alric Quan on 02/01/2017 13:01:47 [name] ([id number]) -------------------------------------------------------------------------------- Wound Assessment Details Patient Name: [name] Date of Service: 02/01/2017 12:30 PM Medical Record Patient Account  Number: [id number] Number: Treating RN: Ahmed Prima 05/05/1934 (83 y.o. Other Clinician: Date of Birth/Sex: Female) Treating ROBSON, [place] Primary Care Aislinn Feliz: Emily Filbert Chuckie Mccathern/Extender: G Referring Destiny Trickey: Melina Modena in Treatment: 4 Wound Status Wound Number: 6 Primary Diabetic Wound/Ulcer of the Lower Etiology: Extremity Wound Location: Right Lower Leg - Lateral, Posterior Wound Open Status: Wounding Event: Gradually Appeared Comorbid Sleep Apnea, Arrhythmia, Hypertension, Date Acquired: 11/04/2016 History: Cirrhosis , Type II Diabetes, End Stage Weeks Of Treatment: 4 Renal Disease, Gout, Osteoarthritis Clustered Wound: No Photos Photo Uploaded By: Alric Quan on 02/01/2017 16:53:14 Wound Measurements Length: (cm) 4.8 Width: (cm) 1.7 Depth: (cm) 1.7 Area: (cm) 6.409 Volume: (cm) 10.895 % Reduction in Area: 37.2% % Reduction in Volume: 28.9% Epithelialization: None Tunneling: No Undermining: No Wound Description Classification: Grade 2 Foul Odor Afte Wound Margin: Distinct, outline attached Slough/Fibrino Exudate Amount: Large Exudate Type: Serosanguineous Exudate Color: red, brown r Cleansing: No No Wound Bed Granulation Amount: None Present (0%) Necrotic Amount: Large (67-100%) Dicostanzo, Donabelle R. ([id number]) Necrotic Quality: Adherent Slough Periwound Skin Texture Texture Color No Abnormalities Noted: No No Abnormalities Noted: No Erythema: Yes Moisture Erythema Location: Circumferential No Abnormalities Noted: No Maceration: Yes Temperature / Pain Temperature: No Abnormality Tenderness on Palpation: Yes Wound Preparation Ulcer Cleansing: Rinsed/Irrigated with Saline, Other: soap and water, Topical Anesthetic Applied: Other: LIDOCAINE 4%, Treatment Notes Wound #6 (Right, Lateral, Posterior Lower Leg) 1. Cleansed with: Clean wound with Normal Saline 2. Anesthetic Topical Lidocaine 4% cream to wound bed prior  to debridement 4. Dressing Applied: Prisma Ag 5. Secondary Dressing Applied Bordered Foam Dressing Dry Gauze Electronic Signature(s) Signed: 02/01/2017 4:45:40 PM By: Alric Quan Entered By: Alric Quan on 02/01/2017 13:03:41 [name] ([id number]) -------------------------------------------------------------------------------- Wound Assessment Details Patient Name: [name] Date of Service: 02/01/2017 12:30 PM Medical Record Patient Account Number: [id number] Number: Treating RN: Ahmed Prima 11-Jan-1934 (83 y.o. Other Clinician: Date of Birth/Sex: Female) Treating ROBSON, MICHAEL Primary Care Ayauna Mcnay: Emily Filbert Bartholomew Ramesh/Extender: G Referring Merryn Thaker: Melina Modena in Treatment: 4 Wound Status Wound Number: 7 Primary Diabetic Wound/Ulcer of the Lower Etiology: Extremity Wound Location: Right Malleolus - Lateral Wound Open Wounding Event: Gradually Appeared Status: Date Acquired: 01/11/2017 Comorbid  Sleep Apnea, Arrhythmia, Hypertension, Weeks Of Treatment: 3 History: Cirrhosis , Type II Diabetes, End Stage Clustered Wound: No Renal Disease, Gout, Osteoarthritis Photos Photo Uploaded By: Alric Quan on 02/01/2017 16:53:14 Wound Measurements Length: (cm) 0.6 Width: (cm) 0.5 Depth: (cm) 0.2 Area: (cm) 0.236 Volume: (cm) 0.047 % Reduction in Area: 53.1% % Reduction in Volume: 68.9% Epithelialization: None Tunneling: No Undermining: No Wound Description Classification: Grade 2 Foul Odor Afte Wound Margin: Distinct, outline attached Slough/Fibrino Exudate Amount: Large Exudate Type: Serous Exudate Color: amber r Cleansing: No No Wound Bed Granulation Amount: None Present (0%) Necrotic Amount: Large (67-100%) Seim, Roselin R. ([id number]) Necrotic Quality: Adherent Slough Periwound Skin Texture Texture Color No Abnormalities Noted: No No Abnormalities Noted: No Moisture No Abnormalities Noted: No Wound  Preparation Ulcer Cleansing: Rinsed/Irrigated with Saline Topical Anesthetic Applied: Other: lidocaine 4%, Treatment Notes Wound #7 (Right, Lateral Malleolus) 1. Cleansed with: Clean wound with Normal Saline Cleanse wound with antibacterial soap and water 2. Anesthetic Topical Lidocaine 4% cream to wound bed prior to debridement 4. Dressing Applied: Prisma Ag 5. Secondary Dressing Applied ABD Pad Dry Gauze 7. Secured with Tape 3 Layer Compression System - Bilateral Electronic Signature(s) Signed: 02/01/2017 4:45:40 PM By: Alric Quan Entered By: Alric Quan on 02/01/2017 13:02:42 SAVANAHA, DENNARD ([id number]) -------------------------------------------------------------------------------- Wound Assessment Details Patient Name: [name] Date of Service: 02/01/2017 12:30 PM Medical Record Patient Account Number: [id number] Number: Treating RN: Ahmed Prima 02/27/1934 (83 y.o. Other Clinician: Date of Birth/Sex: Female) Treating ROBSON, [place] Primary Care Chay Mazzoni: Emily Filbert Cordae Mccarey/Extender: G Referring Alishia Lebo: Melina Modena in Treatment: 4 Wound Status Wound Number: 8 Primary Venous Leg Ulcer Etiology: Wound Location: Left Lower Leg - Lateral, Posterior Wound Healed - Epithelialized Status: Wounding Event: Gradually Appeared Comorbid Sleep Apnea, Arrhythmia, Hypertension, Date Acquired: 01/10/2017 History: Cirrhosis , Type II Diabetes, End Stage Weeks Of Treatment: 2 Renal Disease, Gout, Osteoarthritis Clustered Wound: No Photos Photo Uploaded By: Alric Quan on 02/01/2017 16:54:02 Wound Measurements Length: (cm) 0 % Redu Width: (cm) 0 % Redu Depth: (cm) 0 Tunnel Area: (cm) 0 Under Volume: (cm) 0 ction in Area: 100% ction in Volume: 100% ing: No mining: No Wound Description Classification: Partial Thickness Foul O Diabetic Severity (Wagner): Grade 0 Slough Wound Margin: Distinct, outline attached Exudate  Amount: None Present dor After Cleansing: No /Fibrino No Wound Bed Granulation Amount: None Present (0%) Exposed Structure Necrotic Amount: None Present (0%) Fascia Exposed: No Fat Layer (Subcutaneous Tissue) Exposed: No Lofstrom, Salvo. ([id number]) Tendon Exposed: No Muscle Exposed: No Joint Exposed: No Bone Exposed: No Limited to Skin Breakdown Periwound Skin Texture Texture Color No Abnormalities Noted: No No Abnormalities Noted: No Moisture No Abnormalities Noted: No Wound Preparation Ulcer Cleansing: Rinsed/Irrigated with Saline Topical Anesthetic Applied: None Electronic Signature(s) Signed: 02/01/2017 4:45:40 PM By: Alric Quan Entered By: Alric Quan on 02/01/2017 12:53:28 [name] ([id number]) -------------------------------------------------------------------------------- Wound Assessment Details Patient Name: [name] Date of Service: 02/01/2017 12:30 PM Medical Record Patient Account Number: [id number] Number: Treating RN: Ahmed Prima 05-Jan-1934 (83 y.o. Other Clinician: Date of Birth/Sex: Female) Treating ROBSON, [place] Primary Care Rithy Mandley: Emily Filbert Mahkai Fangman/Extender: G Referring Raeli Wiens: Melina Modena in Treatment: 4 Wound Status Wound Number: 9 Primary Diabetic Wound/Ulcer of the Lower Etiology: Extremity Wound Location: Left Metatarsal head first - Lateral Wound Open Status: Wounding Event: Gradually Appeared Comorbid Sleep Apnea, Arrhythmia, Hypertension, Date Acquired: 02/01/2017 History: Cirrhosis , Type II Diabetes, End Stage Weeks Of Treatment: 0 Renal Disease, Gout, Osteoarthritis Clustered  Wound: No Photos Photo Uploaded By: Alric Quan on 02/01/2017 16:54:02 Wound Measurements Length: (cm) Width: (cm) Depth: (cm) Area: (cm) Volume: (cm) 0.4 % Reduction in Area: 0.3 % Reduction in Volume: 0.1 0.094 0.009 Wound Description Classification: Grade 2 Foul Odor Aft Wound Margin:  Distinct, outline attached Slough/Fibrin Exudate Amount: Large Exudate Type: Serous Exudate Color: amber er Cleansing: No o No Wound Bed Granulation Amount: Medium (34-66%) Granulation Quality: Red, Pink Magid, Destane R. ([id number]) Necrotic Amount: Medium (34-66%) Necrotic Quality: Adherent Slough Periwound Skin Texture Texture Color No Abnormalities Noted: No No Abnormalities Noted: No Moisture Temperature / Pain No Abnormalities Noted: No Tenderness on Palpation: Yes Maceration: Yes Wound Preparation Ulcer Cleansing: Rinsed/Irrigated with Saline, Other: soap and water, Topical Anesthetic Applied: Other: lidocaine 4%, Treatment Notes Wound #9 (Left, Lateral Metatarsal head first) 1. Cleansed with: Clean wound with Normal Saline Cleanse wound with antibacterial soap and water 2. Anesthetic Topical Lidocaine 4% cream to wound bed prior to debridement 4. Dressing Applied: Prisma Ag 5. Secondary Dressing Applied ABD Pad Dry Gauze 7. Secured with Tape 3 Layer Compression System - Bilateral Electronic Signature(s) Signed: 02/01/2017 4:45:40 PM By: Alric Quan Entered By: Alric Quan on 02/01/2017 12:56:21 TANYIKA, MULBERRY ([id number]) -------------------------------------------------------------------------------- [place] Details Patient Name: [name] Date of Service: 02/01/2017 12:30 PM Medical Record Patient Account Number: [id number] Number: Treating RN: Ahmed Prima 1933/12/16 (83 y.o. Other Clinician: Date of Birth/Sex: Female) Treating ROBSON, MICHAEL Primary Care Aleah Ahlgrim: Emily Filbert Isadora Delorey/Extender: G Referring Esdras Delair: Melina Modena in Treatment: 4 Vital Signs Time Taken: 12:38 Temperature (F): 97.8 Height (in): 61 Pulse (bpm): 78 Weight (lbs): 206 Respiratory Rate (breaths/min): 16 Body Mass Index (BMI): 38.9 Blood Pressure (mmHg): 97/52 Reference Range: 80 - 120 mg / dl Electronic Signature(s) Signed:  02/01/2017 4:45:40 PM By: Alric Quan Entered By: Alric Quan on 02/01/2017 12:47:26

## 2017-02-02 NOTE — Progress Notes (Addendum)
CATIRIA, DELAGRANGE ([id number]) Visit Report for 02/01/2017 Chief Complaint Document Details Patient Name: [name], [name] Date of Service: 02/01/2017 12:30 PM Medical Record Patient Account Number: [id number] Number: Treating RN: Ahmed Prima 1933/12/18 (83 y.o. Other Clinician: Date of Birth/Sex: Female) Treating ROBSON, MICHAEL Primary Care Provider: Emily Filbert Provider/Extender: G Referring Provider: Melina Modena in Treatment: 4 Information Obtained from: Patient Chief Complaint 01/04/17; patient is here for review of wounds on her bilateral lower extremities referred from her primary physician Dr. Emily Filbert [facility] Electronic Signature(s) Signed: 02/02/2017 9:58:44 AM By: Linton Ham MD Entered By: Linton Ham on 02/01/2017 14:43:07 [name] ([id number]) -------------------------------------------------------------------------------- HPI Details Patient Name: [name] Date of Service: 02/01/2017 12:30 PM Medical Record Patient Account Number: [id number] Number: Treating RN: Ahmed Prima [birth year] (83 y.o. Other Clinician: Date of Birth/Sex: Female) Treating ROBSON, MICHAEL Primary Care Provider: Emily Filbert Provider/Extender: G Referring Provider: Melina Modena in Treatment: 4 History of Present Illness HPI Description: 01/04/17; this is a 83 year old fairly disabled woman who comes accompanied by her husband today. She is here for review of wounds on her bilateral feet and right calf. There is a hopeful note from Dr. Sabra Heck that is included. The patient is a type II diabetic with end-stage renal failure on dialysis, she is here for bilateral wounds in her feet and heels as well as the right calf wound posteriorly. The history here is a bit difficult to follow. Her husband states that she had a fracture of her leg in October. Indeed looking through [facility] she had a closed nondisplaced fracture of  the lateral condyle of the right femur. This was managed nonoperatively with a knee brace. Her husband thinks that the brace itself caused the wounds on the right leg although that would not of caused ones on the left. Apparently the wounds have been there for 2 months and the patient is being followed by [facility] with bilateral Unna boots. She is also followed in Dr. Ozella Almond office of vascular surgery. She had recent arterial studies that showed biphasic waveforms bilaterally with a ABI on the right of 1.01 on the left at 0.93. T ABIs on the right at 0.72 and on the left at 0.75. He was not felt to have significant arterial disease. I am also not clear what [facility] was primarily dressing the wounds with under the wraps the patient had an admission on 10/17 with an acute right leg DVT status post IVC filter placement and was placed on Eliquis for 6 weeks only. 01/11/17; the x-rays that I ordered last week on this patient have been completed the left is the problem area. She is had a previous distal tib-fib fracture which appears to be better than the last imaging view I can find in September. She appears to have osteomyelitis of the lateral aspect of the talus. Left foot showed bone resorption and loss of cortical Along the medial margin of the navicular and medial cuneiform consistent with on the osteomyelitis. rRght foot and ankle showed diffuse soft tissue swelling without obvious focal bone destruction there was a suggestion of a right fourth metatarsal fracture. boen I actually tried to get more history out of the patient and her husband who is present I've also spoken to her son on the phone. It would appear that the deep area on the lateral aspect of her right knee was abrasive injury also the area on the lateral aspect just above the ankle.  The she also probably consistent with pressure ulcers and dorsal ankle extensive wounds probably wrap injuries although I am guessing  about some of this. She does not have an arterial issue per Dr. dew. Her left tib-fib fracture was apparently in April as well she had a left femur fracture sometime in 2014 2015. She has a rod in her left femur hopefully we can get her through an MRI. I looked through [facility] back to 2009 I don't actually see an x-ray of the left femur. 01/18/17; her MRI is booked for February 13. We had some trouble with home health and the dressings that were prescribed so they left the current wrap on all week. There is increasing erythema on the medial aspect of the right foot compatible with cellulitis she will need an antibiotic JAYAUNA, CHOMA ([id number]) 01/25/17; patient's MRI is actually booked for February 15 not used Dragon. This is a patient that has multiple bilateral lower extremity wounds. I have never been completely certain how these happened and in fact they may be multifactorial. I have suspected that the area on her right lateral upper leg, right lateral lower leg were probably brace injuries. She has 2 large wounds with exposed tendon on her dorsal feet bilaterally. I suspect these were wrap injuries. She has pressure ulcers on her bilateral heels. She does not have significant arterial insufficiency and is previously been seen by Dr.Dew. A plain x-ray of her left foot suggested osteomyelitis which is the reason for the MRI. I gave her doxycycline last week for left foot cellulitis which looks better. 02/09/17; her MRI of the left foot showed diffuse cellulitis and mild fasciitis without drainable soft tissue abscess or pyomyositis. She does however have osteomyelitis involving the medial navicular bone, proximal aspect of the medial cuneiform medially and also possible involvement of the distal medial aspect of the talus. The left foot continues to have a large wound on the anterior foot with exposed tendon and a unstageable wound on the right heel covered in a thick black eschar.  Extensive discussion with the patient today. Probably the better option is a left BKA. She is nonambulatory. With regards to her right foot and leg the wounds are stable. There is necrotic tendon in the right anterior dorsal foot, exposed tendon in the right Achilles area and a very deep wound with exposed tendon on the lateral aspect of her right knee. Nevertheless all of these wounds looks somewhat viable to me. I did not MRI of the right leg as the x-ray did not show evidence of osteomyelitis Electronic Signature(s) Signed: 02/02/2017 9:58:44 AM By: Linton Ham MD Entered By: Linton Ham on 02/01/2017 14:46:40 [name] ([id number]) -------------------------------------------------------------------------------- Physical Exam Details Patient Name: [name] Date of Service: 02/01/2017 12:30 PM Medical Record Patient Account Number: [id number] Number: Treating RN: Ahmed Prima 10/05/1934 (83 y.o. Other Clinician: Date of Birth/Sex: Female) Treating ROBSON, MICHAEL Primary Care Provider: Emily Filbert Provider/Extender: G Referring Provider: Melina Modena in Treatment: 4 Constitutional Sitting or standing Blood Pressure is within target range for patient.. Pulse regular and within target range for patient.Marland Kitchen Respirations regular, non-labored and within target range.. Temperature is normal and within the target range for the patient.. Patient's appearance is neat and clean. Appears in no acute distress. Well nourished and well developed.. Eyes Conjunctivae clear. No discharge.Marland Kitchen Respiratory Respiratory effort is easy and symmetric bilaterally. Rate is normal at rest and on room air.. Bilateral breath sounds are clear and equal in  all lobes with no wheezes, rales or rhonchi.. Cardiovascular A. fib, short systolic murmur.. Pedal pulses palpable and strong bilaterally.. Lymphatic None palpable in the popliteal or inguinal area. Musculoskeletal No  palpable tenderness infusion in either ankle.. Notes Wound exam; the large area over her dorsal feet I'm assuming were wrap injuries. These have exposed tendon on both sides. In general the wound on the right dorsal foot looks better. And I think I could debridement the tendon free, she is nonambulatory. oThe left heel looks about the same still a large necrotic eschar over the heel. oRight heel has exposed tendon. I think this could be closed with an Apligraf however. oextensive area on the right lateral knee. Also has exposed tendon in the middle Electronic Signature(s) Signed: 02/02/2017 9:58:44 AM By: Linton Ham MD Entered By: Linton Ham on 02/01/2017 14:50:01 [name] ([id number]) -------------------------------------------------------------------------------- Physician Orders Details Patient Name: [name] Date of Service: 02/01/2017 12:30 PM Medical Record Patient Account Number: [id number] Number: Treating RN: Ahmed Prima Oct 10, 1934 (83 y.o. Other Clinician: Date of Birth/Sex: Female) Treating ROBSON, MICHAEL Primary Care Provider: Emily Filbert Provider/Extender: G Referring Provider: Melina Modena in Treatment: 4 Verbal / Phone Orders: Yes Clinician: Carolyne Fiscal, Debi Read Back and Verified: Yes Diagnosis Coding Wound Cleansing Wound #1 Left,Dorsal Foot o Clean wound with Normal Saline. o Cleanse wound with mild soap and water Wound #2 Right,Dorsal Foot o Clean wound with Normal Saline. o Cleanse wound with mild soap and water Wound #3 Left Calcaneus o Clean wound with Normal Saline. o Cleanse wound with mild soap and water Wound #4 Right Calcaneus o Clean wound with Normal Saline. o Cleanse wound with mild soap and water Wound #6 Right,Lateral,Posterior Lower Leg o Clean wound with Normal Saline. o Cleanse wound with mild soap and water Wound #7 Right,Lateral Malleolus o Clean wound with Normal  Saline. o Cleanse wound with mild soap and water Anesthetic Wound #1 Left,Dorsal Foot o Topical Lidocaine 4% cream applied to wound bed prior to debridement - for clinic use only Wound #2 Right,Dorsal Foot o Topical Lidocaine 4% cream applied to wound bed prior to debridement - for clinic use only Wound #3 Left Calcaneus o Topical Lidocaine 4% cream applied to wound bed prior to debridement - for clinic use only Wound #4 Right Calcaneus NARALI, SAVARIA. ([id number]) o Topical Lidocaine 4% cream applied to wound bed prior to debridement - for clinic use only Wound #6 Right,Lateral,Posterior Lower Leg o Topical Lidocaine 4% cream applied to wound bed prior to debridement - for clinic use only Wound #7 Right,Lateral Malleolus o Topical Lidocaine 4% cream applied to wound bed prior to debridement - for clinic use only Skin Barriers/Peri-Wound Care Wound #6 Right,Lateral,Posterior Lower Leg o Skin Prep Primary Wound Dressing Wound #1 Left,Dorsal Foot o Iodoflex Wound #2 Right,Dorsal Foot o Iodoflex Wound #3 Left Calcaneus o Medihoney gel Wound #4 Right Calcaneus o Medihoney gel Wound #6 Right,Lateral,Posterior Lower Leg o Prisma Ag Wound #7 Right,Lateral Malleolus o Prisma Ag Secondary Dressing Wound #1 Left,Dorsal Foot o ABD pad o Dry Gauze o Drawtex Wound #2 Right,Dorsal Foot o ABD pad o Dry Gauze o Drawtex Wound #3 Left Calcaneus o ABD pad o Dry Gauze o Other - heel cup Wound #4 Right Calcaneus Mer[place], Ka R. ([id number]) o ABD pad o Dry Gauze o Other - heel cup Wound #6 Right,Lateral,Posterior Lower Leg o Dry Gauze o Boardered Foam Dressing o Drawtex Wound #7 Right,Lateral Malleolus o ABD pad o Dry Gauze  Dressing Change Frequency Wound #1 Left,Dorsal Foot o Three times weekly - pt will have this changed on Tuesdays in the [facility] Wound #2 Right,Dorsal Foot o Three times weekly - pt will  have this changed on Tuesdays in the [place] #3 Left Calcaneus o Three times weekly - pt will have this changed on Tuesdays in the [facility] Wound #4 Right Calcaneus o Three times weekly - pt will have this changed on Tuesdays in the [facility] Wound #6 Right,Lateral,Posterior Lower Leg o Three times weekly - pt will have this changed on Tuesdays in the [facility] Wound #7 Right,Lateral Malleolus o Three times weekly - pt will have this changed on Tuesdays in the [facility] Follow-up Appointments Wound #1 Left,Dorsal Foot o Return Appointment in 1 week. Wound #2 Right,Dorsal Foot o Return Appointment in 1 week. Wound #3 Left Calcaneus o Return Appointment in 1 week. Wound #4 Right Calcaneus o Return Appointment in 1 week. Wound #6 Right,Lateral,Posterior Lower Leg o Return Appointment in 1 week. LAKIESHA, RAINS ([id number]) Edema Control Wound #1 Left,Dorsal Foot o 3 Layer Compression System - Bilateral o Elevate legs to the level of the heart and pump ankles as often as possible Wound #2 Right,Dorsal Foot o 3 Layer Compression System - Bilateral o Elevate legs to the level of the heart and pump ankles as often as possible Wound #3 Left Calcaneus o 3 Layer Compression System - Bilateral o Elevate legs to the level of the heart and pump ankles as often as possible Wound #4 Right Calcaneus o 3 Layer Compression System - Bilateral o Elevate legs to the level of the heart and pump ankles as often as possible Wound #6 Right,Lateral,Posterior Lower Leg o 3 Layer Compression System - Bilateral o Elevate legs to the level of the heart and pump ankles as often as possible Wound #7 Right,Lateral Malleolus o 3 Layer Compression System - Bilateral o Elevate legs to the level of the heart and pump ankles as often as possible Off-Loading Wound #1 Left,Dorsal Foot o Turn and reposition every 2 hours o  Other: - float heels above heart level while in the bed Wound #2 Right,Dorsal Foot o Turn and reposition every 2 hours o Other: - float heels above heart level while in the bed Wound #3 Left Calcaneus o Turn and reposition every 2 hours o Other: - float heels above heart level while in the bed Wound #4 Right Calcaneus o Turn and reposition every 2 hours o Other: - float heels above heart level while in the bed Wound #6 Right,Lateral,Posterior Lower Leg o Turn and reposition every 2 hours o Other: - float heels above heart level while in the bed Wound #7 Right,Lateral Malleolus o Turn and reposition every 2 hours o Other: - float heels above heart level while in the bed Latella, Tanara R. ([id number]) Additional Orders / Instructions Wound #1 Left,Dorsal Foot o Increase protein intake. Wound #2 Right,Dorsal Foot o Increase protein intake. Wound #3 Left Calcaneus o Increase protein intake. Wound #4 Right Calcaneus o Increase protein intake. Wound #6 Right,Lateral,Posterior Lower Leg o Increase protein intake. Wound #7 Right,Lateral Malleolus o Increase protein intake. Home Health Wound #1 [place] Visits o Home Health Nurse may visit PRN to address patientos wound care needs. o FACE TO FACE ENCOUNTER: MEDICARE and MEDICAID PATIENTS: I certify that this patient is under my care and that I had a face-to-face encounter  that meets the physician face-to-face encounter requirements with this patient on this date. The encounter with the patient was in whole or in part for the following MEDICAL CONDITION: (primary reason for [place]) MEDICAL NECESSITY: I certify, that based on my findings, NURSING services are a medically necessary home health service. HOME BOUND STATUS: I certify that my clinical findings support that this patient is homebound (i.e., Due to illness or injury, pt requires aid of supportive devices  such as crutches, cane, wheelchairs, walkers, the use of special transportation or the assistance of another person to leave their place of residence. There is a normal inability to leave the home and doing so requires considerable and taxing effort. Other absences are for medical reasons / religious services and are infrequent or of short duration when for other reasons). o If current dressing causes regression in wound condition, may D/C ordered dressing product/s and apply Normal Saline Moist Dressing daily until next [place] / Other MD appointment. [place] of regression in wound condition at [phone]. o Please direct any NON-WOUND related issues/requests for orders to patient's Primary Care Physician Wound #2 [place] Nurse may visit PRN to address patientos wound care needs. o FACE TO FACE ENCOUNTER: MEDICARE and MEDICAID PATIENTS: I certify that this patient is under my care and that I had a face-to-face encounter that meets the physician face-to-face encounter requirements with this patient on this date. The encounter with the patient was in whole or in part for the following MEDICAL CONDITION: (primary reason for [place]) DARLINA, SHIVERDECKER ([id number]) MEDICAL NECESSITY: I certify, that based on my findings, NURSING services are a medically necessary home health service. HOME BOUND STATUS: I certify that my clinical findings support that this patient is homebound (i.e., Due to illness or injury, pt requires aid of supportive devices such as crutches, cane, wheelchairs, walkers, the use of special transportation or the assistance of another person to leave their place of residence. There is a normal inability to leave the home and doing so requires considerable and taxing effort. Other absences are for medical reasons / religious services and are infrequent or of short duration when  for other reasons). o If current dressing causes regression in wound condition, may D/C ordered dressing product/s and apply Normal Saline Moist Dressing daily until next [place] / Other MD appointment. [place] of regression in wound condition at [phone]. o Please direct any NON-WOUND related issues/requests for orders to patient's Primary Care Physician Wound #3 Left [place] Nurse may visit PRN to address patientos wound care needs. o FACE TO FACE ENCOUNTER: MEDICARE and MEDICAID PATIENTS: I certify that this patient is under my care and that I had a face-to-face encounter that meets the physician face-to-face encounter requirements with this patient on this date. The encounter with the patient was in whole or in part for the following MEDICAL CONDITION: (primary reason for [place]) MEDICAL NECESSITY: I certify, that based on my findings, NURSING services are a medically necessary home health service. HOME BOUND STATUS: I certify that my clinical findings support that this patient is homebound (i.e., Due to illness or injury, pt requires aid of supportive devices such as crutches, cane, wheelchairs, walkers, the use of special transportation or the assistance of another person to leave their place of residence. There is a normal inability to leave the home and  doing so requires considerable and taxing effort. Other absences are for medical reasons / religious services and are infrequent or of short duration when for other reasons). o If current dressing causes regression in wound condition, may D/C ordered dressing product/s and apply Normal Saline Moist Dressing daily until next [place] / Other MD appointment. [place] of regression in wound condition at [phone]. o Please direct any NON-WOUND related issues/requests for orders to patient's Primary  Care Physician Wound #4 Right [place] Nurse may visit PRN to address patientos wound care needs. o FACE TO FACE ENCOUNTER: MEDICARE and MEDICAID PATIENTS: I certify that this patient is under my care and that I had a face-to-face encounter that meets the physician face-to-face encounter requirements with this patient on this date. The encounter with the patient was in whole or in part for the following MEDICAL CONDITION: (primary reason for [place]) MEDICAL NECESSITY: I certify, that based on my findings, NURSING services are a medically necessary home health service. HOME BOUND STATUS: I certify that my clinical findings support that this patient is homebound (i.e., Due to illness or injury, pt requires aid of supportive devices such as crutches, cane, wheelchairs, walkers, the use of special transportation or the assistance of another person to leave their place of residence. There is a normal inability to leave the home and doing so requires considerable and taxing effort. Other absences are for medical reasons / religious services and are infrequent or of short duration when for other reasons). AREEYA, NATION R. ([id number]) o If current dressing causes regression in wound condition, may D/C ordered dressing product/s and apply Normal Saline Moist Dressing daily until next [place] / Other MD appointment. [place] of regression in wound condition at [phone]. o Please direct any NON-WOUND related issues/requests for orders to patient's Primary Care Physician Wound #6 [place] Nurse may visit PRN to address patientos wound care needs. o FACE TO FACE ENCOUNTER: MEDICARE and MEDICAID PATIENTS: I certify that this patient is under my care and that I had a face-to-face encounter that meets the physician  face-to-face encounter requirements with this patient on this date. The encounter with the patient was in whole or in part for the following MEDICAL CONDITION: (primary reason for [place]) MEDICAL NECESSITY: I certify, that based on my findings, NURSING services are a medically necessary home health service. HOME BOUND STATUS: I certify that my clinical findings support that this patient is homebound (i.e., Due to illness or injury, pt requires aid of supportive devices such as crutches, cane, wheelchairs, walkers, the use of special transportation or the assistance of another person to leave their place of residence. There is a normal inability to leave the home and doing so requires considerable and taxing effort. Other absences are for medical reasons / religious services and are infrequent or of short duration when for other reasons). o If current dressing causes regression in wound condition, may D/C ordered dressing product/s and apply Normal Saline Moist Dressing daily until next [place] / Other MD appointment. [place] of regression in wound condition at [phone]. o Please direct any NON-WOUND related issues/requests for orders to patient's Primary Care Physician Wound #7 [place] Nurse may visit PRN to address patientos wound care needs. o FACE TO FACE ENCOUNTER: MEDICARE and  MEDICAID PATIENTS: I certify that this patient is under my care and that I had a face-to-face encounter that meets the physician face-to-face encounter requirements with this patient on this date. The encounter with the patient was in whole or in part for the following MEDICAL CONDITION: (primary reason for [place]) MEDICAL NECESSITY: I certify, that based on my findings, NURSING services are a medically necessary home health service. HOME BOUND STATUS: I certify that my clinical  findings support that this patient is homebound (i.e., Due to illness or injury, pt requires aid of supportive devices such as crutches, cane, wheelchairs, walkers, the use of special transportation or the assistance of another person to leave their place of residence. There is a normal inability to leave the home and doing so requires considerable and taxing effort. Other absences are for medical reasons / religious services and are infrequent or of short duration when for other reasons). o If current dressing causes regression in wound condition, may D/C ordered dressing product/s and apply Normal Saline Moist Dressing daily until next [place] / Other MD appointment. [place] of regression in wound condition at [phone]. o Please direct any NON-WOUND related issues/requests for orders to patient's Primary Care Physician Medications-please add to medication list. Wound #1 Left,Dorsal Foot DEKEISHA, CASABLANCA. ([id number]) o Other: - Vitamin C, Zinc, MVI Wound #2 Right,Dorsal Foot o Other: - Vitamin C, Zinc, MVI Wound #3 Left Calcaneus o Other: - Vitamin C, Zinc, MVI Wound #4 Right Calcaneus o Other: - Vitamin C, Zinc, MVI Wound #6 Right,Lateral,Posterior Lower Leg o Other: - Vitamin C, Zinc, MVI Wound #7 Right,Lateral Malleolus o Other: - Vitamin C, Zinc, MVI [employer] o Ortho Electronic Signature(s) Signed: 02/01/2017 4:45:40 PM By: Alric Quan Signed: 02/02/2017 9:58:44 AM By: Linton Ham MD Entered By: Alric Quan on 02/01/2017 16:29:25 [name] ([id number]) -------------------------------------------------------------------------------- Problem List Details Patient Name: [name] Date of Service: 02/01/2017 12:30 PM Medical Record Patient Account Number: [id number] Number: Treating RN: Ahmed Prima [birth year] (83 y.o. 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/04/2017 Yes muscle involvement without evidence of necrosis L97.515 Non-pressure chronic ulcer of other part of right foot with 01/04/2017 Yes muscle involvement without evidence of necrosis L89.613 Pressure ulcer of right heel, stage 3 01/04/2017 Yes L89.620 Pressure ulcer of left heel, unstageable 01/04/2017 Yes L97.213 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 Other Clinician: Date of Birth/Sex: Female) Treating ROBSON, MICHAEL Primary Care Provider: Emily Filbert Provider/Extender: G Referring Provider: Melina Modena in Treatment: 4 Subjective Chief Complaint Information obtained from Patient 01/04/17; patient is here for review of wounds on her bilateral lower extremities referred from her primary physician Dr. Emily Fil[facility] History of Present Illness (HPI) 01/04/17; this is a 83 year old  fairly disabled woman who comes accompanied by her husband today. She is here for review of wounds on her bilateral feet and right calf.  There is a hopeful note from Dr. Sabra Heck that is included. The patient is a type II diabetic with end-stage renal failure on dialysis, she is here for bilateral wounds in her feet and heels as well as the right calf wound posteriorly. The history here is a bit difficult to follow. Her husband states that she had a fracture of her leg in October. Indeed looking through [facility] she had a closed nondisplaced fracture of the lateral condyle of the right femur. This was managed nonoperatively with a knee brace. Her husband thinks that the brace itself caused the wounds on the right leg although that would not of caused ones on the left. Apparently the wounds have been there for 2 months and the patient is being followed by [facility] with bilateral Unna boots. She is also followed in Dr. Ozella Almond office of vascular surgery. She had recent arterial studies that showed biphasic waveforms bilaterally with a ABI on the right of 1.01 on the left at 0.93. T ABIs on the right at 0.72 and on the left at 0.75. He was not felt to have significant arterial disease. I am also not clear what [facility] was primarily dressing the wounds with under the wraps the patient had an admission on 10/17 with an acute right leg DVT status post IVC filter placement and was placed on Eliquis for 6 weeks only. 01/11/17; the x-rays that I ordered last week on this patient have been completed the left is the problem area. She is had a previous distal tib-fib fracture which appears to be better than the last imaging view I can find in September. She appears to have osteomyelitis of the lateral aspect of the talus. Left foot showed bone resorption and loss of cortical Along the medial margin of the navicular and medial cuneiform consistent with on the osteomyelitis. rRght foot and ankle showed diffuse soft tissue swelling without obvious focal bone destruction there was a suggestion of a right fourth metatarsal  fracture. boen I actually tried to get more history out of the patient and her husband who is present I've also spoken to her son on the phone. It would appear that the deep area on the lateral aspect of her right knee was abrasive injury also the area on the lateral aspect just above the ankle. The she also probably consistent with pressure ulcers and dorsal ankle extensive wounds probably wrap injuries although I am guessing about some of this. She does not have an arterial issue per Dr. dew. Her left tib-fib fracture was apparently in April Quach, Morris ([id number]) as well she had a left femur fracture sometime in 2014 2015. She has a rod in her left femur hopefully we can get her through an MRI. I looked through [facility] back to 2009 I don't actually see an x-ray of the left femur. 01/18/17; her MRI is booked for February 13. We had some trouble with home health and the dressings that were prescribed so they left the current wrap on all week. There is increasing erythema on the medial aspect of the right foot compatible with cellulitis she will need an antibiotic 01/25/17; patient's MRI is actually booked for February 15 not used Dragon. This is a patient that has multiple bilateral lower extremity wounds. I have never been completely certain how these happened and in fact  they may be multifactorial. I have suspected that the area on her right lateral upper leg, right lateral lower leg were probably brace injuries. She has 2 large wounds with exposed tendon on her dorsal feet bilaterally. I suspect these were wrap injuries. She has pressure ulcers on her bilateral heels. She does not have significant arterial insufficiency and is previously been seen by Dr.Dew. A plain x-ray of her left foot suggested osteomyelitis which is the reason for the MRI. I gave her doxycycline last week for left foot cellulitis which looks better. 02/09/17; her MRI of the left foot showed diffuse cellulitis  and mild fasciitis without drainable soft tissue abscess or pyomyositis. She does however have osteomyelitis involving the medial navicular bone, proximal aspect of the medial cuneiform medially and also possible involvement of the distal medial aspect of the talus. The left foot continues to have a large wound on the anterior foot with exposed tendon and a unstageable wound on the right heel covered in a thick black eschar. Extensive discussion with the patient today. Probably the better option is a left BKA. She is nonambulatory. With regards to her right foot and leg the wounds are stable. There is necrotic tendon in the right anterior dorsal foot, exposed tendon in the right Achilles area and a very deep wound with exposed tendon on the lateral aspect of her right knee. Nevertheless all of these wounds looks somewhat viable to me. I did not MRI of the right leg as the x-ray did not show evidence of osteomyelitis Objective Constitutional Sitting or standing Blood Pressure is within target range for patient.. Pulse regular and within target range for patient.Marland Kitchen Respirations regular, non-labored and within target range.. Temperature is normal and within the target range for the patient.. Patient's appearance is neat and clean. Appears in no acute distress. Well nourished and well developed.. Vitals Time Taken: 12:38 PM, Height: 61 in, Weight: 206 lbs, BMI: 38.9, Temperature: 97.8 F, Pulse: 78 bpm, Respiratory Rate: 16 breaths/min, Blood Pressure: 97/52 mmHg. Eyes Conjunctivae clear. No discharge.Marland Kitchen Respiratory Respiratory effort is easy and symmetric bilaterally. Rate is normal at rest and on room air.. Bilateral breath sounds are clear and equal in all lobes with no wheezes, rales or rhonchi.Julieanne Manson, [name] ([id number]) Cardiovascular A. fib, short systolic murmur.. Pedal pulses palpable and strong bilaterally.. Lymphatic None palpable in the popliteal or inguinal  area. Musculoskeletal No palpable tenderness infusion in either ankle.. General Notes: Wound exam; the large area over her dorsal feet I'm assuming were wrap injuries. These have exposed tendon on both sides. In general the wound on the right dorsal foot looks better. And I think I could debridement the tendon free, she is nonambulatory. The left heel looks about the same still a large necrotic eschar over the heel. Right heel has exposed tendon. I think this could be closed with an Apligraf however. extensive area on the right lateral knee. Also has exposed tendon in the middle Integumentary (Hair, Skin) Wound #1 status is Open. Original cause of wound was Gradually Appeared. The wound is located on the Left,Dorsal Foot. The wound measures 3.8cm length x 5.4cm width x 0.4cm depth; 16.116cm^2 area and 6.447cm^3 volume. There is Fat Layer (Subcutaneous Tissue) Exposed exposed. There is no tunneling or undermining noted. There is a medium amount of purulent drainage noted. The wound margin is well defined and not attached to the wound base. There is medium (34-66%) red, pink granulation within the wound bed. There is a medium (34-66%) amount  of necrotic tissue within the wound bed including Eschar and Adherent Slough. The periwound skin appearance exhibited: Callus, Crepitus, Excoriation, Induration, Rash, Scarring, Dry/Scaly, Maceration, Atrophie Blanche, Cyanosis, Ecchymosis, Hemosiderin Staining, Mottled, Pallor, Rubor, Erythema. The surrounding wound skin color is noted with erythema. Wound #2 status is Open. Original cause of wound was Gradually Appeared. The wound is located on the Right,Dorsal Foot. The wound measures 5.2cm length x 3.5cm width x 0.3cm depth; 14.294cm^2 area and 4.288cm^3 volume. There is Fat Layer (Subcutaneous Tissue) Exposed exposed. There is no tunneling or undermining noted. There is a large amount of purulent drainage noted. The wound margin is distinct with the  outline attached to the wound base. There is medium (34-66%) pink granulation within the wound bed. There is a medium (34-66%) amount of necrotic tissue within the wound bed including Eschar and Adherent Slough. The periwound skin appearance exhibited: Maceration, Erythema. The surrounding wound skin color is noted with erythema which is circumferential. Periwound temperature was noted as No Abnormality. The periwound has tenderness on palpation. Wound #3 status is Open. Original cause of wound was Pressure Injury. The wound is located on the Left Calcaneus. The wound measures 3.5cm length x 8.4cm width x 0.5cm depth; 23.091cm^2 area and 11.545cm^3 volume. There is no tunneling or undermining noted. There is a large amount of serous drainage noted. The wound margin is distinct with the outline attached to the wound base. There is no granulation within the wound bed. There is a large (67-100%) amount of necrotic tissue within the wound bed including Eschar. The periwound skin appearance exhibited: Erythema. The surrounding wound skin color is noted with erythema which is circumferential. Periwound temperature was noted as No Abnormality. The periwound has tenderness on palpation. Wound #4 status is Open. Original cause of wound was Pressure Injury. The wound is located on the Right Calcaneus. The wound measures 4.8cm length x 1.7cm width x 0.6cm depth; 6.409cm^2 area and 3.845cm^3 volume. There is no tunneling noted, however, there is undermining starting at 1:00 and ending at 11:00 with a maximum distance of 0.3cm. There is a large amount of purulent drainage noted. The [place] R. ([id number]) wound margin is distinct with the outline attached to the wound base. There is no granulation within the wound bed. There is a large (67-100%) amount of necrotic tissue within the wound bed including Eschar and Adherent Slough. The periwound skin appearance exhibited: Erythema. The surrounding wound  skin color is noted with erythema which is circumferential. Periwound temperature was noted as No Abnormality. The periwound has tenderness on palpation. Wound #6 status is Open. Original cause of wound was Gradually Appeared. The wound is located on the Right,Lateral,Posterior Lower Leg. The wound measures 4.8cm length x 1.7cm width x 1.7cm depth; 6.409cm^2 area and 10.895cm^3 volume. There is no tunneling or undermining noted. There is a large amount of serosanguineous drainage noted. The wound margin is distinct with the outline attached to the wound base. There is no granulation within the wound bed. There is a large (67-100%) amount of necrotic tissue within the wound bed including Adherent Slough. The periwound skin appearance exhibited: Maceration, Erythema. The surrounding wound skin color is noted with erythema which is circumferential. Periwound temperature was noted as No Abnormality. The periwound has tenderness on palpation. Wound #7 status is Open. Original cause of wound was Gradually Appeared. The wound is located on the Right,Lateral Malleolus. The wound measures 0.6cm length x 0.5cm width x 0.2cm depth; 0.236cm^2 area and 0.047cm^3 volume. There is no  tunneling or undermining noted. There is a large amount of serous drainage noted. The wound margin is distinct with the outline attached to the wound base. There is no granulation within the wound bed. There is a large (67-100%) amount of necrotic tissue within the wound bed including Adherent Slough. Wound #8 status is Healed - Epithelialized. Original cause of wound was Gradually Appeared. The wound is located on the Left,Lateral,Posterior Lower Leg. The wound measures 0cm length x 0cm width x 0cm depth; 0cm^2 area and 0cm^3 volume. The wound is limited to skin breakdown. There is no tunneling or undermining noted. There is a none present amount of drainage noted. The wound margin is distinct with the outline attached to the  wound base. There is no granulation within the wound bed. There is no necrotic tissue within the wound bed. Wound #9 status is Open. Original cause of wound was Gradually Appeared. The wound is located on the Left,Lateral Metatarsal head first. The wound measures 0.4cm length x 0.3cm width x 0.1cm depth; 0.094cm^2 area and 0.009cm^3 volume. There is a large amount of serous drainage noted. The wound margin is distinct with the outline attached to the wound base. There is medium (34-66%) red, pink granulation within the wound bed. There is a medium (34-66%) amount of necrotic tissue within the wound bed including Adherent Slough. The periwound skin appearance exhibited: Maceration. The periwound has tenderness on palpation. 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 Cleanse wound with mild soap and water Wound #2 Right,Dorsal Foot: Clean wound with Normal Saline. Cleanse wound with mild soap and water Wound #3 Left Calcaneus: Clean wound with Normal Saline. Cleanse wound with mild soap and water Wound #4 Right Calcaneus: Clean wound with Normal Saline. Cleanse wound with mild soap and water Wound #6 Right,Lateral,Posterior Lower Leg: Clean wound with Normal Saline. Cleanse wound with mild soap and water Wound #7 Right,Lateral Malleolus: Clean wound with Normal  Saline. Cleanse wound with mild soap and water Anesthetic: Wound #1 Left,Dorsal Foot: Topical Lidocaine 4% cream applied to wound bed prior to debridement - for clinic use only Wound #2 Right,Dorsal Foot: Topical Lidocaine 4% cream applied to wound bed prior to debridement - for clinic use only Wound #3 Left Calcaneus: Topical Lidocaine 4% cream applied to wound bed prior to debridement - for clinic use only Wound #4 Right Calcaneus: Topical Lidocaine 4% cream applied to wound bed prior to debridement - for clinic use only Wound #6 Right,Lateral,Posterior Lower Leg: Topical Lidocaine 4% cream applied to wound bed prior to debridement - for clinic use only Wound #7 Right,Lateral Malleolus: Topical Lidocaine 4% cream applied to wound bed prior to debridement - for clinic use only Skin Barriers/Peri-Wound Care: Wound #6 Right,Lateral,Posterior Lower Leg: Skin Prep Primary Wound Dressing: Wound #1 Left,Dorsal Foot: JALEIYAH, BERSTLER R. ([id number]) Wound #2 Right,Dorsal Foot: Iodoflex Wound #3 Left Calcaneus: Medihoney gel Wound #4 Right Calcaneus: Medihoney gel Wound #6 Right,Lateral,Posterior Lower Leg:  Prisma Ag Wound #7 Right,Lateral Malleolus: Prisma Ag Secondary Dressing: Wound #1 Left,Dorsal Foot: ABD pad Dry Gauze Drawtex Wound #2 Right,Dorsal Foot: ABD pad Dry Gauze Drawtex Wound #3 Left Calcaneus: ABD pad Dry Gauze Other - heel cup Wound #4 Right Calcaneus: ABD pad Dry Gauze Other - heel cup Wound #6 Right,Lateral,Posterior Lower Leg: Dry Gauze Boardered Foam Dressing Drawtex Wound #7 Right,Lateral Malleolus: ABD pad Dry Gauze Dressing Change Frequency: Wound #1 Left,Dorsal Foot: Three times weekly - pt will have this changed on Tuesdays in the [facility] Wound #2 Right,Dorsal Foot: Three times weekly - pt will have this changed on Tuesdays in the [facility] Wound #3 Left Calcaneus: Three times weekly - pt will have this changed on  Tuesdays in the [facility] Wound #4 Right Calcaneus: Three times weekly - pt will have this changed on Tuesdays in the [facility] Wound #6 Right,Lateral,Posterior Lower Leg: Three times weekly - pt will have this changed on Tuesdays in the [facility] Wound #7 Right,Lateral Malleolus: Three times weekly - pt will have this changed on Tuesdays in the [facility] Follow-up Appointments: Wound #1 Left,Dorsal Foot: Return Appointment in 1 week. Wound #2 Right,Dorsal Foot: KEICHA, PANTALEO R. ([id number]) Return Appointment in 1 week. Wound #3 Left Calcaneus: Return Appointment in 1 week. Wound #4 Right Calcaneus: Return Appointment in 1 week. Wound #6 Right,Lateral,Posterior Lower Leg: Return Appointment in 1 week. Edema Control: Wound #1 Left,Dorsal Foot: 3 Layer Compression System - Bilateral Elevate legs to the level of the heart and pump ankles as often as possible Wound #2 Right,Dorsal Foot: 3 Layer Compression System - Bilateral Elevate legs to the level of the heart and pump ankles as often as possible Wound #3 Left Calcaneus: 3 Layer Compression System - Bilateral Elevate legs to the level of the heart and pump ankles as often as possible Wound #4 Right Calcaneus: 3 Layer Compression System - Bilateral Elevate legs to the level of the heart and pump ankles as often as possible Wound #6 Right,Lateral,Posterior Lower Leg: 3 Layer Compression System - Bilateral Elevate legs to the level of the heart and pump ankles as often as possible Wound #7 Right,Lateral Malleolus: 3 Layer Compression System - Bilateral Elevate legs to the level of the heart and pump ankles as often as possible Off-Loading: Wound #1 Left,Dorsal Foot: Turn and reposition every 2 hours Other: - float heels above heart level while in the bed Wound #2 Right,Dorsal Foot: Turn and reposition every 2 hours Other: - float heels above heart level while in the bed Wound #3 Left  Calcaneus: Turn and reposition every 2 hours Other: - float heels above heart level while in the bed Wound #4 Right Calcaneus: Turn and reposition every 2 hours Other: - float heels above heart level while in the bed Wound #6 Right,Lateral,Posterior Lower Leg: Turn and reposition every 2 hours Other: - float heels above heart level while in the bed Wound #7 Right,Lateral Malleolus: Turn and reposition every 2 hours Other: - float heels above heart level while in the bed Additional Orders / Instructions: Wound #1 Left,Dorsal Foot: Increase protein intake. Wound #2 Right,Dorsal Foot: Increase protein intake. Wound #3 Left Calcaneus: Coole, Taneasha R. ([id number]) Increase protein intake. Wound #4 Right Calcaneus: Increase protein intake. Wound #6 Right,Lateral,Posterior Lower Leg: Increase protein intake. Wound #7 Right,Lateral Malleolus: Increase protein intake. Home Health: Wound #1 Left,Dorsal Foot: [place] Nurse may visit PRN to address patient  s wound care needs. FACE TO FACE ENCOUNTER: MEDICARE and MEDICAID PATIENTS: I certify that this patient is under my care and that I had a face-to-face encounter that meets the physician face-to-face encounter requirements with this patient on this date. The encounter with the patient was in whole or in part for the following MEDICAL CONDITION: (primary reason for [place]) MEDICAL NECESSITY: I certify, that based on my findings, NURSING services are a medically necessary home health service. HOME BOUND STATUS: I certify that my clinical findings support that this patient is homebound (i.e., Due to illness or injury, pt requires aid of supportive devices such as crutches, cane, wheelchairs, walkers, the use of special transportation or the assistance of another person to leave their place of residence. There is a normal inability to leave the home and doing so requires considerable and taxing effort.  Other absences are for medical reasons / religious services and are infrequent or of short duration when for other reasons). If current dressing causes regression in wound condition, may D/C ordered dressing product/s and apply Normal Saline Moist Dressing daily until next [place] / Other MD appointment. [place] of regression in wound condition at [phone]. Please direct any NON-WOUND related issues/requests for orders to patient's Primary Care Physician Wound #2 Right,Dorsal Foot: [place] Nurse may visit PRN to address patient s wound care needs. FACE TO FACE ENCOUNTER: MEDICARE and MEDICAID PATIENTS: I certify that this patient is under my care and that I had a face-to-face encounter that meets the physician face-to-face encounter requirements with this patient on this date. The encounter with the patient was in whole or in part for the following MEDICAL CONDITION: (primary reason for [place]) MEDICAL NECESSITY: I certify, that based on my findings, NURSING services are a medically necessary home health service. HOME BOUND STATUS: I certify that my clinical findings support that this patient is homebound (i.e., Due to illness or injury, pt requires aid of supportive devices such as crutches, cane, wheelchairs, walkers, the use of special transportation or the assistance of another person to leave their place of residence. There is a normal inability to leave the home and doing so requires considerable and taxing effort. Other absences are for medical reasons / religious services and are infrequent or of short duration when for other reasons). If current dressing causes regression in wound condition, may D/C ordered dressing product/s and apply Normal Saline Moist Dressing daily until next [place] / Other MD appointment. [place] of regression in wound condition at [phone]. Please  direct any NON-WOUND related issues/requests for orders to patient's Primary Care Physician Wound #3 Left Calcaneus: [place] Nurse may visit PRN to address patient s wound care needs. FACE TO FACE ENCOUNTER: MEDICARE and MEDICAID PATIENTS: I certify that this patient is under my care and that I had a face-to-face encounter that meets the physician face-to-face encounter requirements with this patient on this date. The encounter with the patient was in whole or in part for the following MEDICAL CONDITION: (primary reason for [place]) MEDICAL NECESSITY: I certify, that based on my findings, NURSING services are a medically necessary home health service. HOME BOUND STATUS: I certify that my clinical findings support that this patient is homebound (i.e., Due to [place], LETHER [place]RON. ([id number]) illness or injury, pt requires aid of supportive devices such as crutches, cane, wheelchairs, walkers, the use of special transportation or the  assistance of another person to leave their place of residence. There is a normal inability to leave the home and doing so requires considerable and taxing effort. Other absences are for medical reasons / religious services and are infrequent or of short duration when for other reasons). If current dressing causes regression in wound condition, may D/C ordered dressing product/s and apply Normal Saline Moist Dressing daily until next [place] / Other MD appointment. [place] of regression in wound condition at [phone]. Please direct any NON-WOUND related issues/requests for orders to patient's Primary Care Physician Wound #4 Right Calcaneus: [place] Nurse may visit PRN to address patient s wound care needs. FACE TO FACE ENCOUNTER: MEDICARE and MEDICAID PATIENTS: I certify that this patient is under my care and that I had a face-to-face encounter that meets the  physician face-to-face encounter requirements with this patient on this date. The encounter with the patient was in whole or in part for the following MEDICAL CONDITION: (primary reason for [place]) MEDICAL NECESSITY: I certify, that based on my findings, NURSING services are a medically necessary home health service. HOME BOUND STATUS: I certify that my clinical findings support that this patient is homebound (i.e., Due to illness or injury, pt requires aid of supportive devices such as crutches, cane, wheelchairs, walkers, the use of special transportation or the assistance of another person to leave their place of residence. There is a normal inability to leave the home and doing so requires considerable and taxing effort. Other absences are for medical reasons / religious services and are infrequent or of short duration when for other reasons). If current dressing causes regression in wound condition, may D/C ordered dressing product/s and apply Normal Saline Moist Dressing daily until next [place] / Other MD appointment. [place] of regression in wound condition at [phone]. Please direct any NON-WOUND related issues/requests for orders to patient's Primary Care Physician Wound #6 Right,Lateral,Posterior Lower Leg: [place] Nurse may visit PRN to address patient s wound care needs. FACE TO FACE ENCOUNTER: MEDICARE and MEDICAID PATIENTS: I certify that this patient is under my care and that I had a face-to-face encounter that meets the physician face-to-face encounter requirements with this patient on this date. The encounter with the patient was in whole or in part for the following MEDICAL CONDITION: (primary reason for [place]) MEDICAL NECESSITY: I certify, that based on my findings, NURSING services are a medically necessary home health service. HOME BOUND STATUS: I certify that my clinical findings  support that this patient is homebound (i.e., Due to illness or injury, pt requires aid of supportive devices such as crutches, cane, wheelchairs, walkers, the use of special transportation or the assistance of another person to leave their place of residence. There is a normal inability to leave the home and doing so requires considerable and taxing effort. Other absences are for medical reasons / religious services and are infrequent or of short duration when for other reasons). If current dressing causes regression in wound condition, may D/C ordered dressing product/s and apply Normal Saline Moist Dressing daily until next [place] / Other MD appointment. [place] of regression in wound condition at [phone]. Please direct any NON-WOUND related issues/requests for orders to patient's Primary Care Physician Wound #7 Right,Lateral Malleolus: [place] Nurse may visit PRN to address patient s wound care needs. FACE TO  FACE ENCOUNTER: MEDICARE and MEDICAID PATIENTS: I certify that this patient is under my care and that I had a face-to-face encounter that meets the physician face-to-face encounter requirements with this patient on this date. The encounter with the patient was in whole or in part for the following MEDICAL CONDITION: (primary reason for [place]) MEDICAL NECESSITY: I certify, that based on my findings, NURSING services are a medically necessary home health service. HOME BOUND STATUS: I certify that my clinical findings support that this patient is homebound (i.e., Due to [place], [place]. ([id number]) illness or injury, pt requires aid of supportive devices such as crutches, cane, wheelchairs, walkers, the use of special transportation or the assistance of another person to leave their place of residence. There is a normal inability to leave the home and doing so requires considerable and taxing effort. Other  absences are for medical reasons / religious services and are infrequent or of short duration when for other reasons). If current dressing causes regression in wound condition, may D/C ordered dressing product/s and apply Normal Saline Moist Dressing daily until next [place] / Other MD appointment. [place] of regression in wound condition at [phone]. Please direct any NON-WOUND related issues/requests for orders to patient's Primary Care Physician Medications-please add to medication list.: Wound #1 Left,Dorsal Foot: Other: - Vitamin C, Zinc, MVI Wound #2 Right,Dorsal Foot: Other: - Vitamin C, Zinc, MVI Wound #3 Left Calcaneus: Other: - Vitamin C, Zinc, MVI Wound #4 Right Calcaneus: Other: - Vitamin C, Zinc, MVI Wound #6 Right,Lateral,Posterior Lower Leg: Other: - Vitamin C, Zinc, MVI Wound #7 Right,Lateral Malleolus: Other: - Vitamin C, Zinc, MVI ordered were: Ortho #1 no change to any dressing extensive discussion re the infection in the left foot, with the son on a phone in the room best option is a left bka vs IV antibiotics +HBO #2 I don't believer there is active infection in the left foot soft tissue in spite of the MRI Report. No abs for now #3 she will discuss with family and decide re amputation Electronic Signature(s) Signed: 02/03/2017 3:26:33 PM By: Gretta Cool RN, BSN, Kim RN, BSN Signed: 02/09/2017 3:57:30 PM By: Linton Ham MD Previous Signature: 02/02/2017 9:58:44 AM Version By: Linton Ham MD Entered By: Gretta Cool RN, BSN, Kim on 02/03/2017 15:26:33 JAHNE, IANNACONE ([id number]) -------------------------------------------------------------------------------- SuperBill Details Patient Name: [name] Date of Service: 02/01/2017 Medical Record Patient Account Number: [id number] Number: Treating RN: Ahmed Prima 09-04-1934 (83 y.o. 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

## 2017-02-04 IMAGING — CT CT ABD-PELV W/ CM
2 of 5 series · 13 of 46 positions shown, 15 images · IV contrast (omnipaque)
Comparison: CT the abdomen and pelvis 03/04/2015. CT of the chest
09/16/2013.

CLINICAL DATA: 81-year-old female with cough since February 2015,
worsening over the past 2 weeks, now productive of yellow and
yellowish green sputum. Shortness of breath on exertion.

EXAM:
CT CHEST, ABDOMEN, AND PELVIS WITH CONTRAST
TECHNIQUE: Multidetector CT imaging of the chest, abdomen and pelvis was
performed following the standard protocol during bolus
administration of intravenous contrast.
CONTRAST:  100mL OMNIPAQUE IOHEXOL 300 MG/ML  SOLN

[Series 2: cap with · axial · 0.78mm/px · z∈[-954,-429]mm · 10 of 119 slices shown, 12 images]
[im 7/119  soft-tissue]
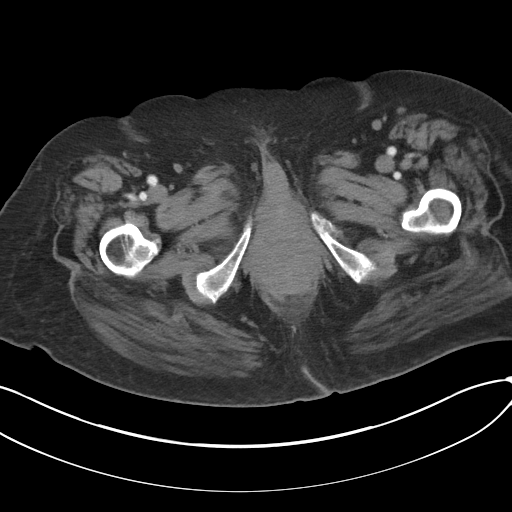
[im 7/119  bone]
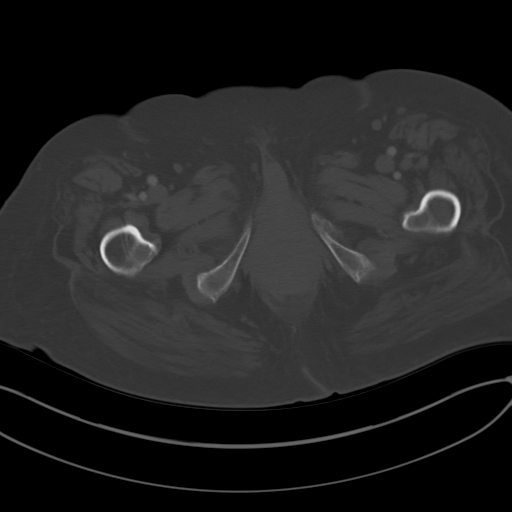
[im 20/119  soft-tissue]
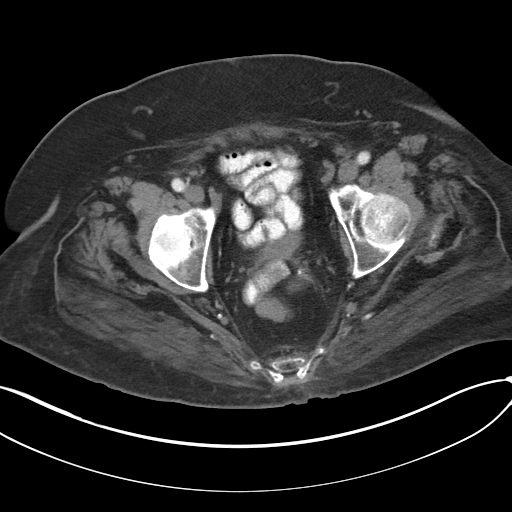
[im 33/119  soft-tissue]
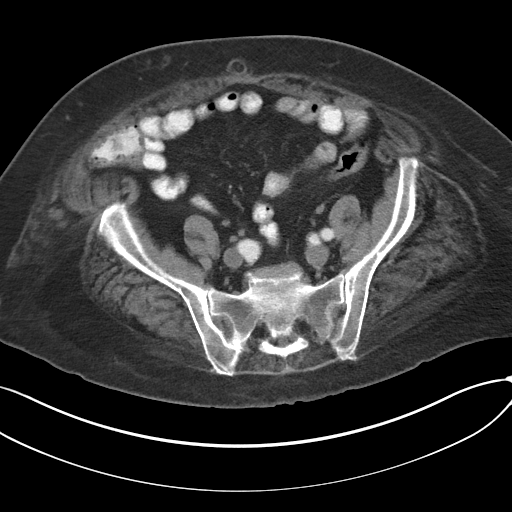
[im 40/119  soft-tissue]
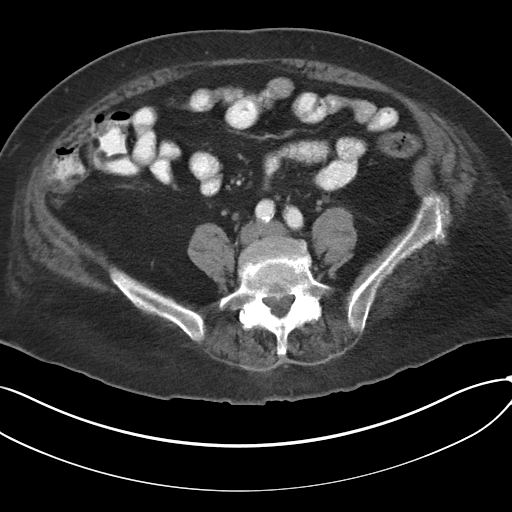
[im 53/119  soft-tissue]
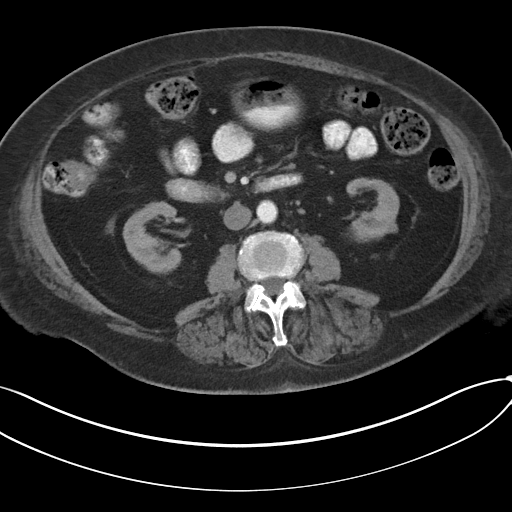
[im 66/119  soft-tissue]
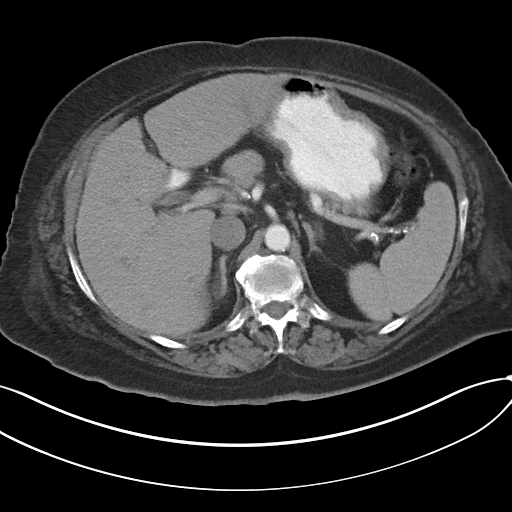
[im 79/119  soft-tissue]
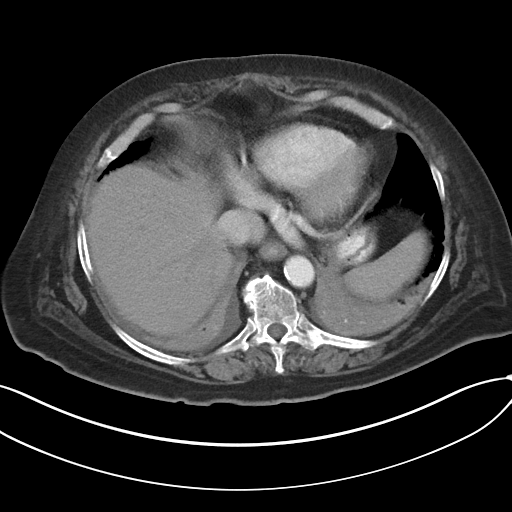
[im 86/119  soft-tissue]
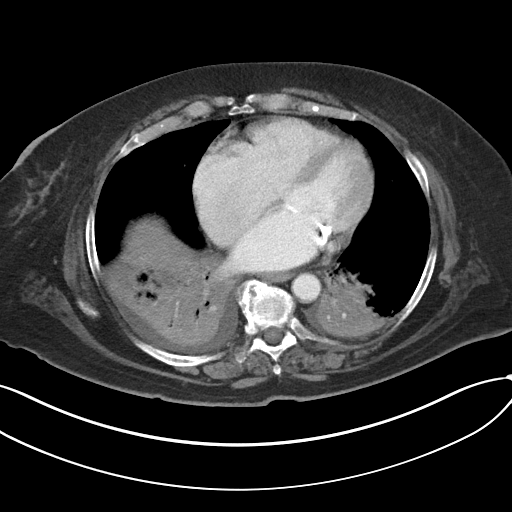
[im 99/119  soft-tissue]
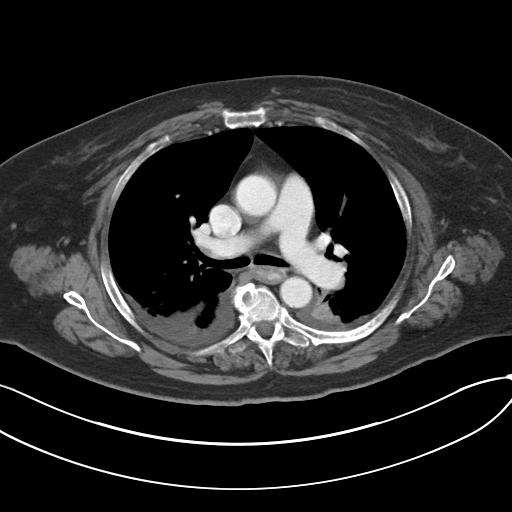
[im 99/119  bone]
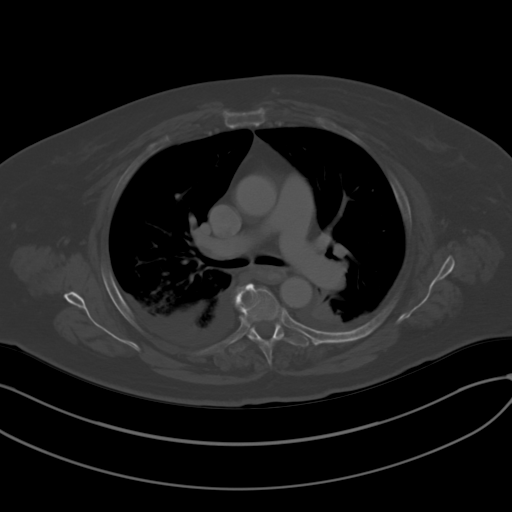
[im 112/119  soft-tissue]
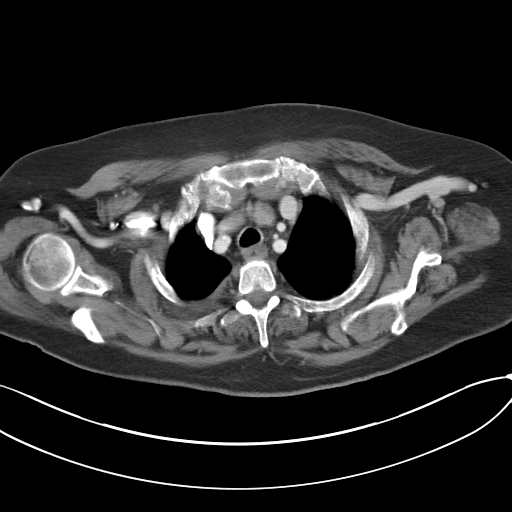

[Series 6: cor cap with · coronal · 0.82mm/px · 3 of 158 slices shown]
[im 53/158  soft-tissue]
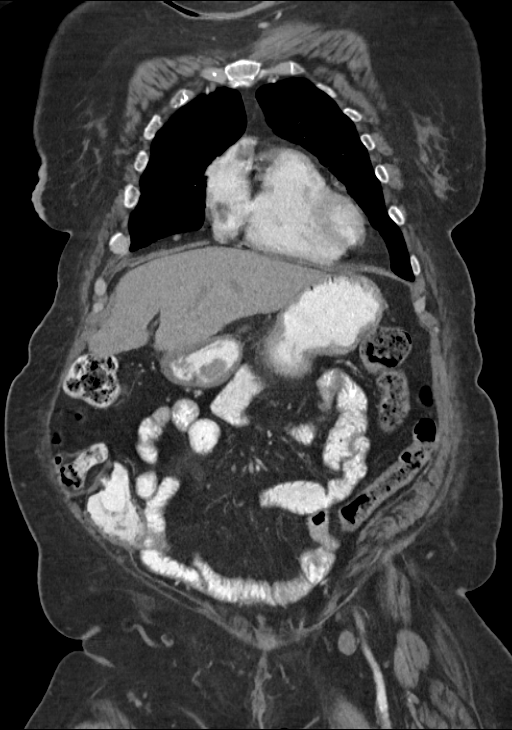
[im 70/158  soft-tissue]
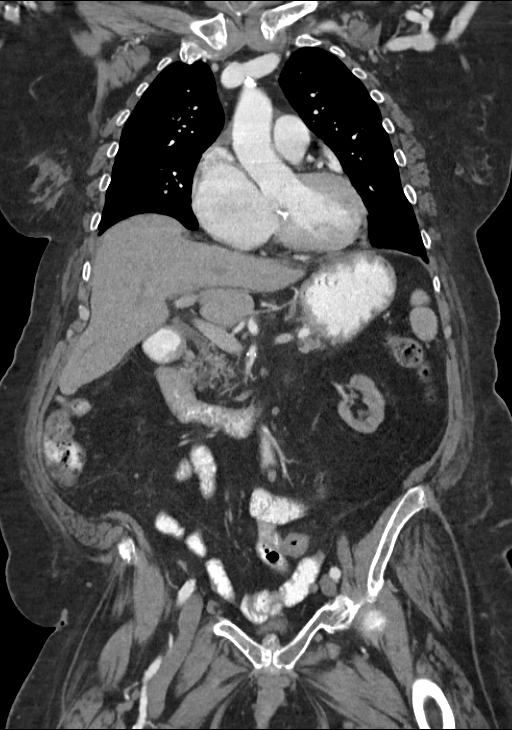
[im 88/158  soft-tissue]
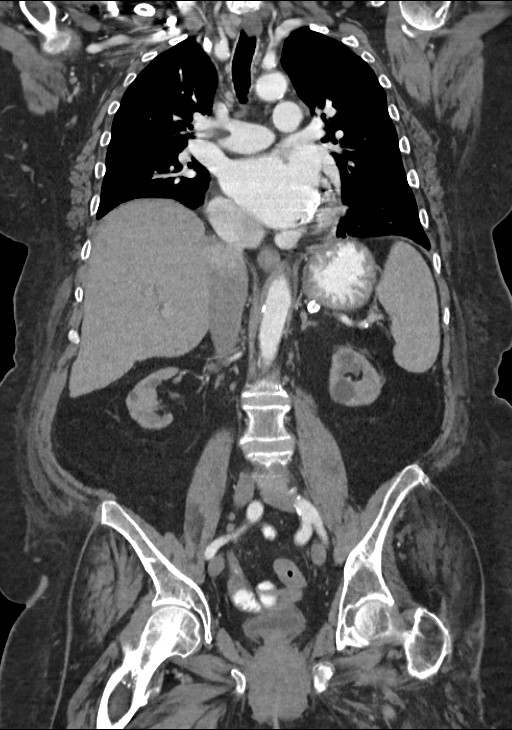

[13 of 46 positions shown; findings below may reference images not displayed]

FINDINGS: CT CHEST FINDINGS

Mediastinum/Lymph Nodes: Heart size is enlarged with biatrial
dilatation. There is no significant pericardial fluid, thickening or
pericardial calcification. Severe tooth thickening calcification of
the mitral valve and mitral annulus. There is atherosclerosis of the
thoracic aorta, the great vessels of the mediastinum and the
coronary arteries, including calcified atherosclerotic plaque in the
left main and right coronary arteries. Numerous prominent borderline
enlarged mediastinal and bilateral hilar lymph nodes, likely
reactive. Esophagus is unremarkable in appearance. No axillary
lymphadenopathy.

Lungs/Pleura: Extensive volume loss and airspace consolidation in
the lower lobes of the lungs bilaterally. Many of the lower lobe
bronchi appear filled with fluid, presumably retained secretions
and/or aspirated material. Small bilateral pleural effusions (right
greater than left) layering dependently. There is also some patchy
peribronchovascular ground-glass attenuation and micronodularity in
the dependent portion of the right upper lobe. No other larger more
suspicious appearing pulmonary nodules or masses are identified.

Musculoskeletal/Soft Tissues: There are no aggressive appearing
lytic or blastic lesions noted in the visualized portions of the
skeleton.

CT ABDOMEN AND PELVIS FINDINGS

Hepatobiliary: The liver has a slightly shrunken appearance and
mildly nodular contour, suggestive of underlying cirrhosis. No
discrete cystic or solid hepatic lesion. No intra or extrahepatic
biliary ductal dilatation. Gallbladder is not visualized, presumably
surgically absent (no surgical clips are noted in the gallbladder
fossa).

Pancreas: No pancreatic mass. No pancreatic ductal dilatation. No
pancreatic or peripancreatic fluid or inflammatory changes.

Spleen: Unremarkable.

Adrenals/Urinary Tract: Multiple small calcifications are noted
within the collecting systems of the kidneys bilaterally, likely a
combination of vascular calcifications and nonobstructive calculi.
The largest nonobstructive calculus is in the interpolar region of
the left kidney measuring 3 mm. Multiple sub cm low-attenuation
lesions in the kidneys bilaterally too small to definitively
characterize, but are favored to represent tiny cysts. 1.9 cm
low-attenuation lesion in the interpolar region of the left kidney
is compatible with a simple cyst. Bilateral adrenal glands are
normal in appearance. No hydroureteronephrosis. Urinary bladder is
normal in appearance.

Stomach/Bowel: The appearance of the stomach is normal. No
pathologic dilatation of small bowel or colon.

Vascular/Lymphatic: Atherosclerosis throughout the abdominal and
pelvic vasculature, without evidence of aneurysm or dissection. No
lymphadenopathy noted in the abdomen or pelvis.

Reproductive: Status post hysterectomy. Ovaries are not confidently
identified may be surgically absent or atrophic.

Other: Tiny umbilical hernia containing only omental fat
incidentally noted. No significant volume of ascites. No
pneumoperitoneum.

Musculoskeletal: Chronic compression fracture of L4 with
approximately 30% loss of anterior vertebral body height is
unchanged. Cavernous hemangioma and L1 vertebral body again
incidentally noted. Old healed fractures of the left superior and
inferior pubic rami are incidentally noted. There are no aggressive
appearing lytic or blastic lesions noted in the visualized portions
of the skeleton.
IMPRESSION: 1. Extensive volume loss and airspace consolidation in the lower
lobes of the lungs bilaterally, and to a lesser extent in the
posterior aspect of the right upper lobe. Given the patient's
chronic symptoms, these findings are concerning for recurrent
aspiration with associated aspiration pneumonia, and further
evaluation by speech therapy to assess for aspiration is recommended
in the near future.
2. Small bilateral pleural effusions layering dependently (right
greater than left).
3. No acute findings in the abdomen or pelvis.
4. Atherosclerosis, including left main and right coronary artery
disease. Assessment for potential risk factor modification, dietary
therapy or pharmacologic therapy may be warranted, if clinically
indicated.
5. The appearance of the liver suggests underlying cirrhosis.
6. Cardiomegaly with biatrial dilatation.
7. There are calcifications of the mitral valve and annulus.
Echocardiographic correlation for evaluation of potential valvular
dysfunction may be warranted if clinically indicated.
8. Additional incidental findings, as above.

## 2017-02-08 ENCOUNTER — Encounter: Payer: Medicare Other | Admitting: Internal Medicine

## 2017-02-08 ENCOUNTER — Ambulatory Visit (INDEPENDENT_AMBULATORY_CARE_PROVIDER_SITE_OTHER): Payer: Medicare Other | Admitting: Vascular Surgery

## 2017-02-08 DIAGNOSIS — E11621 Type 2 diabetes mellitus with foot ulcer: Secondary | ICD-10-CM | POA: Diagnosis not present

## 2017-02-10 NOTE — Progress Notes (Signed)
[name], [name] ([id number]) Visit Report for 02/08/2017 Chief Complaint Document Details Patient Name: [name], [name] Date of Service: 02/08/2017 12:30 PM Medical Record Patient Account Number: [id number] Number: Treating RN: Ahmed Prima 08-05-1934 (83 y.o. Other Clinician: Date of Birth/Sex: Female) Treating Hani Patnode Primary Care Provider: Emily Filbert Provider/Extender: G Referring Provider: Melina Modena in Treatment: 5 Information Obtained from: Patient Chief Complaint 01/04/17; patient is here for review of wounds on her bilateral lower extremities referred from her primary physician Dr. Emily Filbert [facility] Electronic Signature(s) Signed: 02/09/2017 3:56:26 PM By: Linton Ham MD Entered By: Linton Ham on 02/09/2017 07:42:28 [name] ([id number]) -------------------------------------------------------------------------------- HPI Details Patient Name: [name] Date of Service: 02/08/2017 12:30 PM Medical Record Patient Account Number: [id number] Number: Treating RN: Ahmed Prima [birth year] (83 y.o. Other Clinician: Date of Birth/Sex: Female) Treating Shirlyn Savin Primary Care Provider: Emily Filbert Provider/Extender: G Referring Provider: Melina Modena in Treatment: 5 History of Present Illness HPI Description: 01/04/17; this is a 83 year old fairly disabled woman who comes accompanied by her husband today. She is here for review of wounds on her bilateral feet and right calf. There is a hopeful note from Dr. Sabra Heck that is included. The patient is a type II diabetic with end-stage renal failure on dialysis, she is here for bilateral wounds in her feet and heels as well as the right calf wound posteriorly. The history here is a bit difficult to follow. Her husband states that she had a fracture of her leg in October. Indeed looking through [facility] she had a closed nondisplaced fracture of  the lateral condyle of the right femur. This was managed nonoperatively with a knee brace. Her husband thinks that the brace itself caused the wounds on the right leg although that would not of caused ones on the left. Apparently the wounds have been there for 2 months and the patient is being followed by [facility] with bilateral Unna boots. She is also followed in Dr. Ozella Almond office of vascular surgery. She had recent arterial studies that showed biphasic waveforms bilaterally with a ABI on the right of 1.01 on the left at 0.93. T ABIs on the right at 0.72 and on the left at 0.75. He was not felt to have significant arterial disease. I am also not clear what [facility] was primarily dressing the wounds with under the wraps the patient had an admission on 10/17 with an acute right leg DVT status post IVC filter placement and was placed on Eliquis for 6 weeks only. 01/11/17; the x-rays that I ordered last week on this patient have been completed the left is the problem area. She is had a previous distal tib-fib fracture which appears to be better than the last imaging view I can find in September. She appears to have osteomyelitis of the lateral aspect of the talus. Left foot showed bone resorption and loss of cortical Along the medial margin of the navicular and medial cuneiform consistent with on the osteomyelitis. rRght foot and ankle showed diffuse soft tissue swelling without obvious focal bone destruction there was a suggestion of a right fourth metatarsal fracture. boen I actually tried to get more history out of the patient and her husband who is present I've also spoken to her son on the phone. It would appear that the deep area on the lateral aspect of her right knee was abrasive injury also the area on the lateral aspect just above the ankle.  The she also probably consistent with pressure ulcers and dorsal ankle extensive wounds probably wrap injuries although I am guessing  about some of this. She does not have an arterial issue per Dr. dew. Her left tib-fib fracture was apparently in April as well she had a left femur fracture sometime in 2014 2015. She has a rod in her left femur hopefully we can get her through an MRI. I looked through [facility] back to 2009 I don't actually see an x-ray of the left femur. 01/18/17; her MRI is booked for February 13. We had some trouble with home health and the dressings that were prescribed so they left the current wrap on all week. There is increasing erythema on the medial aspect of the right foot compatible with cellulitis she will need an antibiotic [name], [name] ([id number]) 01/25/17; patient's MRI is actually booked for February 15 not used Dragon. This is a patient that has multiple bilateral lower extremity wounds. I have never been completely certain how these happened and in fact they may be multifactorial. I have suspected that the area on her right lateral upper leg, right lateral lower leg were probably brace injuries. She has 2 large wounds with exposed tendon on her dorsal feet bilaterally. I suspect these were wrap injuries. She has pressure ulcers on her bilateral heels. She does not have significant arterial insufficiency and is previously been seen by Dr.Dew. A plain x-ray of her left foot suggested osteomyelitis which is the reason for the MRI. I gave her doxycycline last week for left foot cellulitis which looks better. 02/01/17; her MRI of the left foot showed diffuse cellulitis and mild fasciitis without drainable soft tissue abscess or pyomyositis. She does however have osteomyelitis involving the medial navicular bone, proximal aspect of the medial cuneiform medially and also possible involvement of the distal medial aspect of the talus. The left foot continues to have a large wound on the anterior foot with exposed tendon and a unstageable wound on the right heel covered in a thick black eschar.  Extensive discussion with the patient today. Probably the better option is a left BKA. She is nonambulatory. With regards to her right foot and leg the wounds are stable. There is necrotic tendon in the right anterior dorsal foot, exposed tendon in the right Achilles area and a very deep wound with exposed tendon on the lateral aspect of her right knee. Nevertheless all of these wounds looks somewhat viable to me. I did not MRI of the right leg as the x-ray did not show evidence of osteomyelitis 02/08/17; with regards to her right foot including the dorsal foot and the right heel this is about the same. MRI shows underlying osteomyelitis again an extensive discussion about amputation the patient still seems ambivalent. The areas over the left dorsal foot left medial ankle and the deep wound over the right lateral knee are stable to improved. Given her reasonably normal vascularity I think this foot and limb or potentially salvageable. X-ray of her foot on the right did not show osteomyelitis. I have urged her to consider a left BKA Electronic Signature(s) Signed: 02/09/2017 3:56:26 PM By: Linton Ham MD Entered By: Linton Ham on 02/09/2017 07:44:37 [name] ([id number]) -------------------------------------------------------------------------------- Physical Exam Details Patient Name: [name] Date of Service: 02/08/2017 12:30 PM Medical Record Patient Account Number: [id number] Number: Treating RN: Ahmed Prima 04-07-1934 (83 y.o. Other Clinician: Date of Birth/Sex: Female) Treating Jenille Laszlo Primary Care Provider: Emily Filbert Provider/Extender: Darnell Level  Referring Provider: Melina Modena in Treatment: 5 Constitutional Sitting or standing Blood Pressure is within target range for patient.. Pulse regular and within target range for patient.Marland Kitchen Respirations regular, non-labored and within target range.. Temperature is normal and within the target range  for the patient.. Patient's appearance is neat and clean. Appears in no acute distress. Well nourished and well developed.. Eyes Conjunctivae clear. No discharge.Marland Kitchen Respiratory Respiratory effort is easy and symmetric bilaterally. Rate is normal at rest and on room air.. Cardiovascular Vascular supply seems normal. Lymphatic Nonpalpable in the popliteal or inguinal area. Psychiatric Patient seems competent to make decisions and not depressed.. Notes Wound exam; I think there is some improvement in the area on the right medial heel and the right medial knee. The areas on the left foot are about the same no debridement was attempted in any of these areas. Electronic Signature(s) Signed: 02/09/2017 3:56:26 PM By: Linton Ham MD Entered By: Linton Ham on 02/09/2017 07:46:20 [name] ([id number]) -------------------------------------------------------------------------------- Physician Orders Details Patient Name: [name] Date of Service: 02/08/2017 12:30 PM Medical Record Patient Account Number: [id number] Number: Treating RN: Ahmed Prima Nov 06, 1934 (83 y.o. Other Clinician: Date of Birth/Sex: Female) Treating Jennier Schissler Primary Care Provider: Emily Filbert Provider/Extender: G Referring Provider: Melina Modena in Treatment: 5 Verbal / Phone Orders: Yes Clinician: Carolyne Fiscal, Debi Read Back and Verified: Yes Diagnosis Coding Wound Cleansing Wound #1 Left,Dorsal Foot o Clean wound with Normal Saline. o Cleanse wound with mild soap and water Wound #2 Right,Dorsal Foot o Clean wound with Normal Saline. o Cleanse wound with mild soap and water Wound #3 Left Calcaneus o Clean wound with Normal Saline. o Cleanse wound with mild soap and water Wound #4 Right Calcaneus o Clean wound with Normal Saline. o Cleanse wound with mild soap and water Wound #6 Right,Lateral,Posterior Lower Leg o Clean wound with Normal  Saline. o Cleanse wound with mild soap and water Wound #7 Right,Lateral Malleolus o Clean wound with Normal Saline. o Cleanse wound with mild soap and water Anesthetic Wound #1 Left,Dorsal Foot o Topical Lidocaine 4% cream applied to wound bed prior to debridement - for clinic use only Wound #2 Right,Dorsal Foot o Topical Lidocaine 4% cream applied to wound bed prior to debridement - for clinic use only Wound #3 Left Calcaneus o Topical Lidocaine 4% cream applied to wound bed prior to debridement - for clinic use only Wound #4 Right Calcaneus LEASHA, KAROW. ([id number]) o Topical Lidocaine 4% cream applied to wound bed prior to debridement - for clinic use only Wound #6 Right,Lateral,Posterior Lower Leg o Topical Lidocaine 4% cream applied to wound bed prior to debridement - for clinic use only Wound #7 Right,Lateral Malleolus o Topical Lidocaine 4% cream applied to wound bed prior to debridement - for clinic use only Skin Barriers/Peri-Wound Care Wound #6 Right,Lateral,Posterior Lower Leg o Skin Prep Primary Wound Dressing Wound #1 Left,Dorsal Foot o Iodoflex Wound #2 Right,Dorsal Foot o Iodoflex Wound #3 Left Calcaneus o Medihoney gel Wound #4 Right Calcaneus o Medihoney gel Wound #6 Right,Lateral,Posterior Lower Leg o Prisma Ag Wound #7 Right,Lateral Malleolus o Prisma Ag Secondary Dressing Wound #1 Left,Dorsal Foot o ABD pad o Dry Gauze o Drawtex Wound #2 Right,Dorsal Foot o ABD pad o Dry Gauze o Drawtex Wound #3 Left Calcaneus o ABD pad o Dry Gauze o Other - heel cup Wound #4 Right Calcaneus [name], Connie R. ([id number]) o ABD pad o Dry Gauze o Other - heel cup Wound #6  Right,Lateral,Posterior Lower Leg o Dry Gauze o Boardered Foam Dressing o Drawtex Wound #7 Right,Lateral Malleolus o ABD pad o Dry Gauze Dressing Change Frequency Wound #1 Left,Dorsal Foot o Three times weekly - pt will  have this changed on Tuesdays in the [place] #2 Right,Dorsal Foot o Three times weekly - pt will have this changed on Tuesdays in the [place] #3 Left Calcaneus o Three times weekly - pt will have this changed on Tuesdays in the [facility] Wound #4 Right Calcaneus o Three times weekly - pt will have this changed on Tuesdays in the [facility] Wound #6 Right,Lateral,Posterior Lower Leg o Three times weekly - pt will have this changed on Tuesdays in the [facility] Wound #7 Right,Lateral Malleolus o Three times weekly - pt will have this changed on Tuesdays in the [facility] Follow-up Appointments Wound #1 Left,Dorsal Foot o Return Appointment in 1 week. Wound #2 Right,Dorsal Foot o Return Appointment in 1 week. Wound #3 Left Calcaneus o Return Appointment in 1 week. Wound #4 Right Calcaneus o Return Appointment in 1 week. Wound #6 Right,Lateral,Posterior Lower Leg o Return Appointment in 1 week. [name], [name] ([id number]) Edema Control Wound #1 Left,Dorsal Foot o 3 Layer Compression System - Bilateral o Elevate legs to the level of the heart and pump ankles as often as possible Wound #2 Right,Dorsal Foot o 3 Layer Compression System - Bilateral o Elevate legs to the level of the heart and pump ankles as often as possible Wound #3 Left Calcaneus o 3 Layer Compression System - Bilateral o Elevate legs to the level of the heart and pump ankles as often as possible Wound #4 Right Calcaneus o 3 Layer Compression System - Bilateral o Elevate legs to the level of the heart and pump ankles as often as possible Wound #6 Right,Lateral,Posterior Lower Leg o 3 Layer Compression System - Bilateral o Elevate legs to the level of the heart and pump ankles as often as possible Wound #7 Right,Lateral Malleolus o 3 Layer Compression System - Bilateral o Elevate legs to the level of the heart and  pump ankles as often as possible Off-Loading Wound #1 Left,Dorsal Foot o Turn and reposition every 2 hours o Other: - float heels above heart level while in the bed Wound #2 Right,Dorsal Foot o Turn and reposition every 2 hours o Other: - float heels above heart level while in the bed Wound #3 Left Calcaneus o Turn and reposition every 2 hours o Other: - float heels above heart level while in the bed Wound #4 Right Calcaneus o Turn and reposition every 2 hours o Other: - float heels above heart level while in the bed Wound #6 Right,Lateral,Posterior Lower Leg o Turn and reposition every 2 hours o Other: - float heels above heart level while in the bed Wound #7 Right,Lateral Malleolus o Turn and reposition every 2 hours o Other: - float heels above heart level while in the bed Tullos, Macarena R. ([id number]) Additional Orders / Instructions Wound #1 Left,Dorsal Foot o Increase protein intake. Wound #2 Right,Dorsal Foot o Increase protein intake. Wound #3 Left Calcaneus o Increase protein intake. Wound #4 Right Calcaneus o Increase protein intake. Wound #6 Right,Lateral,Posterior Lower Leg o Increase protein intake. Wound #7 Right,Lateral Malleolus o Increase protein intake. Home Health Wound #1 [place] Visits o Home Health Nurse may visit PRN to address patientos wound care needs. o FACE TO  FACE ENCOUNTER: MEDICARE and MEDICAID PATIENTS: I certify that this patient is under my care and that I had a face-to-face encounter that meets the physician face-to-face encounter requirements with this patient on this date. The encounter with the patient was in whole or in part for the following MEDICAL CONDITION: (primary reason for [place]) MEDICAL NECESSITY: I certify, that based on my findings, NURSING services are a medically necessary home health service. HOME BOUND STATUS: I certify that my clinical  findings support that this patient is homebound (i.e., Due to illness or injury, pt requires aid of supportive devices such as crutches, cane, wheelchairs, walkers, the use of special transportation or the assistance of another person to leave their place of residence. There is a normal inability to leave the home and doing so requires considerable and taxing effort. Other absences are for medical reasons / religious services and are infrequent or of short duration when for other reasons). o If current dressing causes regression in wound condition, may D/C ordered dressing product/s and apply Normal Saline Moist Dressing daily until next [place] / Other MD appointment. [place] of regression in wound condition at [phone]. o Please direct any NON-WOUND related issues/requests for orders to patient's Primary Care Physician Wound #2 [place] Nurse may visit PRN to address patientos wound care needs. o FACE TO FACE ENCOUNTER: MEDICARE and MEDICAID PATIENTS: I certify that this patient is under my care and that I had a face-to-face encounter that meets the physician face-to-face encounter requirements with this patient on this date. The encounter with the patient was in whole or in part for the following MEDICAL CONDITION: (primary reason for [place]) ZAN, LOAYZA ([id number]) MEDICAL NECESSITY: I certify, that based on my findings, NURSING services are a medically necessary home health service. HOME BOUND STATUS: I certify that my clinical findings support that this patient is homebound (i.e., Due to illness or injury, pt requires aid of supportive devices such as crutches, cane, wheelchairs, walkers, the use of special transportation or the assistance of another person to leave their place of residence. There is a normal inability to leave the home and doing so requires considerable and  taxing effort. Other absences are for medical reasons / religious services and are infrequent or of short duration when for other reasons). o If current dressing causes regression in wound condition, may D/C ordered dressing product/s and apply Normal Saline Moist Dressing daily until next [place] / Other MD appointment. [place] of regression in wound condition at [phone]. o Please direct any NON-WOUND related issues/requests for orders to patient's Primary Care Physician Wound #3 Left [place] Nurse may visit PRN to address patientos wound care needs. o FACE TO FACE ENCOUNTER: MEDICARE and MEDICAID PATIENTS: I certify that this patient is under my care and that I had a face-to-face encounter that meets the physician face-to-face encounter requirements with this patient on this date. The encounter with the patient was in whole or in part for the following MEDICAL CONDITION: (primary reason for [place]) MEDICAL NECESSITY: I certify, that based on my findings, NURSING services are a medically necessary home health service. HOME BOUND STATUS: I certify that my clinical findings support that this patient is homebound (i.e., Due to illness or injury, pt requires aid of supportive devices such as crutches, cane, wheelchairs, walkers, the use of special transportation  or the assistance of another person to leave their place of residence. There is a normal inability to leave the home and doing so requires considerable and taxing effort. Other absences are for medical reasons / religious services and are infrequent or of short duration when for other reasons). o If current dressing causes regression in wound condition, may D/C ordered dressing product/s and apply Normal Saline Moist Dressing daily until next [place] / Other MD appointment. [place] of regression in  wound condition at [phone]. o Please direct any NON-WOUND related issues/requests for orders to patient's Primary Care Physician Wound #4 Right [place] Nurse may visit PRN to address patientos wound care needs. o FACE TO FACE ENCOUNTER: MEDICARE and MEDICAID PATIENTS: I certify that this patient is under my care and that I had a face-to-face encounter that meets the physician face-to-face encounter requirements with this patient on this date. The encounter with the patient was in whole or in part for the following MEDICAL CONDITION: (primary reason for [place]) MEDICAL NECESSITY: I certify, that based on my findings, NURSING services are a medically necessary home health service. HOME BOUND STATUS: I certify that my clinical findings support that this patient is homebound (i.e., Due to illness or injury, pt requires aid of supportive devices such as crutches, cane, wheelchairs, walkers, the use of special transportation or the assistance of another person to leave their place of residence. There is a normal inability to leave the home and doing so requires considerable and taxing effort. Other absences are for medical reasons / religious services and are infrequent or of short duration when for other reasons). [name], [name] R. ([id number]) o If current dressing causes regression in wound condition, may D/C ordered dressing product/s and apply Normal Saline Moist Dressing daily until next [place] / Other MD appointment. [place] of regression in wound condition at [phone]. o Please direct any NON-WOUND related issues/requests for orders to patient's Primary Care Physician Wound #6 [place] Nurse may visit PRN to address patientos wound care needs. o FACE TO FACE ENCOUNTER: MEDICARE and MEDICAID PATIENTS: I certify  that this patient is under my care and that I had a face-to-face encounter that meets the physician face-to-face encounter requirements with this patient on this date. The encounter with the patient was in whole or in part for the following MEDICAL CONDITION: (primary reason for [place]) MEDICAL NECESSITY: I certify, that based on my findings, NURSING services are a medically necessary home health service. HOME BOUND STATUS: I certify that my clinical findings support that this patient is homebound (i.e., Due to illness or injury, pt requires aid of supportive devices such as crutches, cane, wheelchairs, walkers, the use of special transportation or the assistance of another person to leave their place of residence. There is a normal inability to leave the home and doing so requires considerable and taxing effort. Other absences are for medical reasons / religious services and are infrequent or of short duration when for other reasons). o If current dressing causes regression in wound condition, may D/C ordered dressing product/s and apply Normal Saline Moist Dressing daily until next [place] / Other MD appointment. [place] of regression in wound condition at [phone]. o Please direct any NON-WOUND related issues/requests for orders to patient's Primary Care Physician Wound #7 [place]  [facility] Visits o [place] Nurse may visit PRN to address patientos wound care needs. o FACE TO FACE ENCOUNTER: MEDICARE and MEDICAID PATIENTS: I certify that this patient is under my care and that I had a face-to-face encounter that meets the physician face-to-face encounter requirements with this patient on this date. The encounter with the patient was in whole or in part for the following MEDICAL CONDITION: (primary reason for [place]) MEDICAL NECESSITY: I certify, that based on my findings, NURSING services are a  medically necessary home health service. HOME BOUND STATUS: I certify that my clinical findings support that this patient is homebound (i.e., Due to illness or injury, pt requires aid of supportive devices such as crutches, cane, wheelchairs, walkers, the use of special transportation or the assistance of another person to leave their place of residence. There is a normal inability to leave the home and doing so requires considerable and taxing effort. Other absences are for medical reasons / religious services and are infrequent or of short duration when for other reasons). o If current dressing causes regression in wound condition, may D/C ordered dressing product/s and apply Normal Saline Moist Dressing daily until next [place] / Other MD appointment. [place] of regression in wound condition at [phone]. o Please direct any NON-WOUND related issues/requests for orders to patient's Primary Care Physician Medications-please add to medication list. Wound #1 Left,Dorsal Foot KALIYHA, ZAMARRIPA. ([id number]) o Other: - Vitamin C, Zinc, MVI Wound #2 Right,Dorsal Foot o Other: - Vitamin C, Zinc, MVI Wound #3 Left Calcaneus o Other: - Vitamin C, Zinc, MVI Wound #4 Right Calcaneus o Other: - Vitamin C, Zinc, MVI Wound #6 Right,Lateral,Posterior Lower Leg o Other: - Vitamin C, Zinc, MVI Wound #7 Right,Lateral Malleolus o Other: - Vitamin C, Zinc, MVI Consults o Vascular - Dr. Lucky Cowboy Electronic Signature(s) Signed: 02/08/2017 4:34:40 PM By: Alric Quan Signed: 02/09/2017 3:56:26 PM By: Linton Ham MD Entered By: Alric Quan on 02/08/2017 14:41:38 [name] ([id number]) -------------------------------------------------------------------------------- Problem List Details Patient Name: [name] Date of Service: 02/08/2017 12:30 PM Medical Record Patient Account Number: [id number] Number: Treating RN: Ahmed Prima [birth year] (83 y.o. Other Clinician: Date of Birth/Sex: Female) Treating Shataya Winkles Primary Care Provider: Emily Filbert Provider/Extender: G Referring Provider: Melina Modena in Treatment: 5 Active Problems ICD-10 Encounter Code Description Active Date Diagnosis E11.621 Type 2 diabetes mellitus with foot ulcer 01/04/2017 Yes L97.525 Non-pressure chronic ulcer of other part of left foot with 01/04/2017 Yes muscle involvement without evidence of necrosis L97.515 Non-pressure chronic ulcer of other part of right foot with 01/04/2017 Yes muscle involvement without evidence of necrosis L89.613 Pressure ulcer of right heel, stage 3 01/04/2017 Yes L89.620 Pressure ulcer of left heel, unstageable 01/04/2017 Yes L97.213 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 Other Clinician: Date of Birth/Sex: Female) Treating Markus Casten Primary Care Provider: Emily Filbert Provider/Extender: G Referring Provider: Melina Modena in Treatment: 5 Subjective Chief Complaint Information obtained from Patient 01/04/17; patient is here for review of wounds on her bilateral  lower extremities referred from her primary physician Dr. Emily [facility] History of Present Illness (HPI) 01/04/17; this is a 83 year old fairly disabled woman who comes accompanied by her  husband today. She is here for review of wounds on her bilateral feet and right calf. There is a hopeful note from Dr. Sabra Heck that is included. The patient is a type II diabetic with end-stage renal failure on dialysis, she is here for bilateral wounds in her feet and heels as well as the right calf wound posteriorly. The history here is a bit difficult to follow. Her husband states that she had a fracture of her leg in October. Indeed looking through [facility] she had a closed nondisplaced fracture of the lateral condyle of the right femur. This was managed nonoperatively with a knee brace. Her husband thinks that the brace itself caused the wounds on the right leg although that would not of caused ones on the left. Apparently the wounds have been there for 2 months and the patient is being followed by [facility] with bilateral Unna boots. She is also followed in Dr. Ozella Almond office of vascular surgery. She had recent arterial studies that showed biphasic waveforms bilaterally with a ABI on the right of 1.01 on the left at 0.93. T ABIs on the right at 0.72 and on the left at 0.75. He was not felt to have significant arterial disease. I am also not clear what [facility] was primarily dressing the wounds with under the wraps the patient had an admission on 10/17 with an acute right leg DVT status post IVC filter placement and was placed on Eliquis for 6 weeks only. 01/11/17; the x-rays that I ordered last week on this patient have been completed the left is the problem area. She is had a previous distal tib-fib fracture which appears to be better than the last imaging view I can find in September. She appears to have osteomyelitis of the lateral aspect of the talus. Left foot showed bone resorption and loss of cortical Along the medial margin of the navicular and medial cuneiform consistent with on the osteomyelitis. rRght foot and ankle showed diffuse soft tissue swelling without  obvious focal bone destruction there was a suggestion of a right fourth metatarsal fracture. boen I actually tried to get more history out of the patient and her husband who is present I've also spoken to her son on the phone. It would appear that the deep area on the lateral aspect of her right knee was abrasive injury also the area on the lateral aspect just above the ankle. The she also probably consistent with pressure ulcers and dorsal ankle extensive wounds probably wrap injuries although I am guessing about some of this. She does not have an arterial issue per Dr. dew. Her left tib-fib fracture was apparently in April [name], [name] ([id number]) as well she had a left femur fracture sometime in 2014 2015. She has a rod in her left femur hopefully we can get her through an MRI. I looked through [facility] back to 2009 I don't actually see an x-ray of the left femur. 01/18/17; her MRI is booked for February 13. We had some trouble with home health and the dressings that were prescribed so they left the current wrap on all week. There is increasing erythema on the medial aspect of the right foot compatible with cellulitis she will need an antibiotic 01/25/17; patient's MRI is actually booked for February 15 not used  Dragon. This is a patient that has multiple bilateral lower extremity wounds. I have never been completely certain how these happened and in fact they may be multifactorial. I have suspected that the area on her right lateral upper leg, right lateral lower leg were probably brace injuries. She has 2 large wounds with exposed tendon on her dorsal feet bilaterally. I suspect these were wrap injuries. She has pressure ulcers on her bilateral heels. She does not have significant arterial insufficiency and is previously been seen by Dr.Dew. A plain x-ray of her left foot suggested osteomyelitis which is the reason for the MRI. I gave her doxycycline last week for left  foot cellulitis which looks better. 02/01/17; her MRI of the left foot showed diffuse cellulitis and mild fasciitis without drainable soft tissue abscess or pyomyositis. She does however have osteomyelitis involving the medial navicular bone, proximal aspect of the medial cuneiform medially and also possible involvement of the distal medial aspect of the talus. The left foot continues to have a large wound on the anterior foot with exposed tendon and a unstageable wound on the right heel covered in a thick black eschar. Extensive discussion with the patient today. Probably the better option is a left BKA. She is nonambulatory. With regards to her right foot and leg the wounds are stable. There is necrotic tendon in the right anterior dorsal foot, exposed tendon in the right Achilles area and a very deep wound with exposed tendon on the lateral aspect of her right knee. Nevertheless all of these wounds looks somewhat viable to me. I did not MRI of the right leg as the x-ray did not show evidence of osteomyelitis 02/08/17; with regards to her right foot including the dorsal foot and the right heel this is about the same. MRI shows underlying osteomyelitis again an extensive discussion about amputation the patient still seems ambivalent. The areas over the left dorsal foot left medial ankle and the deep wound over the right lateral knee are stable to improved. Given her reasonably normal vascularity I think this foot and limb or potentially salvageable. X-ray of her foot on the right did not show osteomyelitis. I have urged her to consider a left BKA Objective Constitutional Sitting or standing Blood Pressure is within target range for patient.. Pulse regular and within target range for patient.Marland Kitchen Respirations regular, non-labored and within target range.. Temperature is normal and within the target range for the patient.. Patient's appearance is neat and clean. Appears in no acute distress.  Well nourished and well developed.. Vitals Time Taken: 12:36 PM, Height: 61 in, Weight: 206 lbs, BMI: 38.9, Temperature: 98.4 F, Pulse: 82 bpm, Respiratory Rate: 16 breaths/min, Blood Pressure: 102/59 mmHg. [name], [name] R. ([id number]) Eyes Conjunctivae clear. No discharge.Marland Kitchen Respiratory Respiratory effort is easy and symmetric bilaterally. Rate is normal at rest and on room air.. Cardiovascular Vascular supply seems normal. Lymphatic Nonpalpable in the popliteal or inguinal area. Psychiatric Patient seems competent to make decisions and not depressed.. General Notes: Wound exam; I think there is some improvement in the area on the right medial heel and the right medial knee. The areas on the left foot are about the same no debridement was attempted in any of these areas. Integumentary (Hair, Skin) Wound #1 status is Open. Original cause of wound was Gradually Appeared. The wound is located on the Left,Dorsal Foot. The wound measures 3.4cm length x 5.2cm width x 0.6cm depth; 13.886cm^2 area and 8.332cm^3 volume. There is Fat Layer (Subcutaneous Tissue) Exposed exposed.  There is no tunneling noted, however, there is undermining starting at 11:00 and ending at 2:00 with a maximum distance of 1.1cm. There is a large amount of purulent drainage noted. The wound margin is well defined and not attached to the wound base. There is medium (34-66%) red, pink granulation within the wound bed. There is a medium (34-66%) amount of necrotic tissue within the wound bed including Eschar and Adherent Slough. The periwound skin appearance exhibited: Callus, Crepitus, Excoriation, Induration, Rash, Scarring, Dry/Scaly, Maceration, Atrophie Blanche, Cyanosis, Ecchymosis, Hemosiderin Staining, Mottled, Pallor, Rubor, Erythema. The surrounding wound skin color is noted with erythema. Periwound temperature was noted as No Abnormality. The periwound has tenderness on palpation. Wound #2 status is Open.  Original cause of wound was Gradually Appeared. The wound is located on the Right,Dorsal Foot. The wound measures 5.5cm length x 3.2cm width x 0.3cm depth; 13.823cm^2 area and 4.147cm^3 volume. There is Fat Layer (Subcutaneous Tissue) Exposed exposed. There is no tunneling or undermining noted. There is a large amount of purulent drainage noted. The wound margin is distinct with the outline attached to the wound base. There is medium (34-66%) pink granulation within the wound bed. There is a medium (34-66%) amount of necrotic tissue within the wound bed including Eschar and Adherent Slough. The periwound skin appearance exhibited: Maceration, Erythema. The surrounding wound skin color is noted with erythema which is circumferential. Periwound temperature was noted as No Abnormality. The periwound has tenderness on palpation. Wound #3 status is Open. Original cause of wound was Pressure Injury. The wound is located on the Left Calcaneus. The wound measures 3.4cm length x 7.8cm width x 0.1cm depth; 20.829cm^2 area and 2.083cm^3 volume. There is no tunneling or undermining noted. There is a large amount of serous drainage noted. The wound margin is distinct with the outline attached to the wound base. There is no granulation within the wound bed. There is a large (67-100%) amount of necrotic tissue within the wound bed including Eschar. The periwound skin appearance exhibited: Erythema. The surrounding wound skin color is noted with erythema which is circumferential. Periwound temperature was noted as No Abnormality. The periwound has tenderness on palpation. [name], [name] R. ([id number]) Wound #4 status is Open. Original cause of wound was Pressure Injury. The wound is located on the Right Calcaneus. The wound measures 4.6cm length x 1.8cm width x 0.2cm depth; 6.503cm^2 area and 1.301cm^3 volume. There is no tunneling or undermining noted. There is a large amount of purulent drainage noted. The wound  margin is distinct with the outline attached to the wound base. There is no granulation within the wound bed. There is a large (67-100%) amount of necrotic tissue within the wound bed including Eschar and Adherent Slough. The periwound skin appearance exhibited: Erythema. The surrounding wound skin color is noted with erythema which is circumferential. Periwound temperature was noted as No Abnormality. The periwound has tenderness on palpation. Wound #6 status is Open. Original cause of wound was Gradually Appeared. The wound is located on the Right,Lateral,Posterior Lower Leg. The wound measures 4.5cm length x 2.5cm width x 2.2cm depth; 8.836cm^2 area and 19.439cm^3 volume. There is no tunneling or undermining noted. There is a large amount of serosanguineous drainage noted. The wound margin is distinct with the outline attached to the wound base. There is large (67-100%) red, pink granulation within the wound bed. There is a small (1-33%) amount of necrotic tissue within the wound bed including Adherent Slough. The periwound skin appearance exhibited: Maceration, Erythema. The surrounding  wound skin color is noted with erythema which is circumferential. Periwound temperature was noted as No Abnormality. The periwound has tenderness on palpation. Wound #7 status is Open. Original cause of wound was Gradually Appeared. The wound is located on the Right,Lateral Malleolus. The wound measures 0.9cm length x 0.6cm width x 0.2cm depth; 0.424cm^2 area and 0.085cm^3 volume. There is no tunneling or undermining noted. There is a large amount of serous drainage noted. The wound margin is distinct with the outline attached to the wound base. There is no granulation within the wound bed. There is a large (67-100%) amount of necrotic tissue within the wound bed including Adherent Slough. Wound #9 status is Open. Original cause of wound was Gradually Appeared. The wound is located on the Left,Lateral  Metatarsal head first. The wound measures 0.4cm length x 0.6cm width x 0.1cm depth; 0.188cm^2 area and 0.019cm^3 volume. There is no tunneling or undermining noted. There is a large amount of serous drainage noted. The wound margin is distinct with the outline attached to the wound base. There is no granulation within the wound bed. There is a large (67-100%) amount of necrotic tissue within the wound bed including Eschar. The periwound skin appearance exhibited: Maceration. The periwound has tenderness on palpation. 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 ([id number]) [phone] - Non-pressure chronic ulcer of right calf with necrosis of muscle E11.42 - Type 2 diabetes mellitus with diabetic polyneuropathy Plan Wound Cleansing: Wound #1 Left,Dorsal Foot: Clean wound with Normal Saline. Cleanse wound with mild soap and water Wound #2 Right,Dorsal Foot: Clean wound with Normal Saline. Cleanse wound with mild soap and water Wound #3 Left Calcaneus: Clean wound with Normal Saline. Cleanse wound with mild soap and water Wound #4 Right Calcaneus: Clean wound with Normal Saline. Cleanse wound with mild soap and water Wound #6 Right,Lateral,Posterior Lower Leg: Clean wound with Normal Saline. Cleanse wound with mild soap and water Wound #7 Right,Lateral Malleolus: Clean wound with Normal Saline. Cleanse wound with mild soap and water Anesthetic: Wound #1 Left,Dorsal Foot: Topical Lidocaine 4% cream applied to wound bed prior to debridement - for clinic use only Wound #2 Right,Dorsal Foot: Topical  Lidocaine 4% cream applied to wound bed prior to debridement - for clinic use only Wound #3 Left Calcaneus: Topical Lidocaine 4% cream applied to wound bed prior to debridement - for clinic use only Wound #4 Right Calcaneus: Topical Lidocaine 4% cream applied to wound bed prior to debridement - for clinic use only Wound #6 Right,Lateral,Posterior Lower Leg: Topical Lidocaine 4% cream applied to wound bed prior to debridement - for clinic use only Wound #7 Right,Lateral Malleolus: Topical Lidocaine 4% cream applied to wound bed prior to debridement - for clinic use only Skin Barriers/Peri-Wound Care: Wound #6 Right,Lateral,Posterior Lower Leg: Skin Prep Primary Wound Dressing: Wound #1 Left,Dorsal Foot: Iodoflex Wound #2 Right,Dorsal Foot: [name], MANIBUSAN R. ([id number]) Wound #3 Left Calcaneus: Medihoney gel Wound #4 Right Calcaneus: Medihoney gel Wound #6 Right,Lateral,Posterior Lower Leg: Prisma Ag Wound #7 Right,Lateral Malleolus: Prisma Ag Secondary Dressing: Wound #1 Left,Dorsal Foot: ABD pad Dry Gauze Drawtex Wound #2 Right,Dorsal Foot: ABD pad Dry Gauze  Drawtex Wound #3 Left Calcaneus: ABD pad Dry Gauze Other - heel cup Wound #4 Right Calcaneus: ABD pad Dry Gauze Other - heel cup Wound #6 Right,Lateral,Posterior Lower Leg: Dry Gauze Boardered Foam Dressing Drawtex Wound #7 Right,Lateral Malleolus: ABD pad Dry Gauze Dressing Change Frequency: Wound #1 Left,Dorsal Foot: Three times weekly - pt will have this changed on Tuesdays in the [facility] Wound #2 Right,Dorsal Foot: Three times weekly - pt will have this changed on Tuesdays in the [facility] Wound #3 Left Calcaneus: Three times weekly - pt will have this changed on Tuesdays in the [facility] Wound #4 Right Calcaneus: Three times weekly - pt will have this changed on Tuesdays in the [facility] Wound #6 Right,Lateral,Posterior Lower Leg: Three times weekly - pt will  have this changed on Tuesdays in the [facility] Wound #7 Right,Lateral Malleolus: Three times weekly - pt will have this changed on Tuesdays in the [facility] Follow-up Appointments: Wound #1 Left,Dorsal Foot: Return Appointment in 1 week. Wound #2 Right,Dorsal Foot: Return Appointment in 1 week. Wound #3 Left Calcaneus: SHALANA, LEFF R. ([id number]) Return Appointment in 1 week. Wound #4 Right Calcaneus: Return Appointment in 1 week. Wound #6 Right,Lateral,Posterior Lower Leg: Return Appointment in 1 week. Edema Control: Wound #1 Left,Dorsal Foot: 3 Layer Compression System - Bilateral Elevate legs to the level of the heart and pump ankles as often as possible Wound #2 Right,Dorsal Foot: 3 Layer Compression System - Bilateral Elevate legs to the level of the heart and pump ankles as often as possible Wound #3 Left Calcaneus: 3 Layer Compression System - Bilateral Elevate legs to the level of the heart and pump ankles as often as possible Wound #4 Right Calcaneus: 3 Layer Compression System - Bilateral Elevate legs to the level of the heart and pump ankles as often as possible Wound #6 Right,Lateral,Posterior Lower Leg: 3 Layer Compression System - Bilateral Elevate legs to the level of the heart and pump ankles as often as possible Wound #7 Right,Lateral Malleolus: 3 Layer Compression System - Bilateral Elevate legs to the level of the heart and pump ankles as often as possible Off-Loading: Wound #1 Left,Dorsal Foot: Turn and reposition every 2 hours Other: - float heels above heart level while in the bed Wound #2 Right,Dorsal Foot: Turn and reposition every 2 hours Other: - float heels above heart level while in the bed Wound #3 Left Calcaneus: Turn and reposition every 2 hours Other: - float heels above heart level while in the bed Wound #4 Right Calcaneus: Turn and reposition every 2 hours Other: - float heels above heart level while in the bed Wound #6  Right,Lateral,Posterior Lower Leg: Turn and reposition every 2 hours Other: - float heels above heart level while in the bed Wound #7 Right,Lateral Malleolus: Turn and reposition every 2 hours Other: - float heels above heart level while in the bed Additional Orders / Instructions: Wound #1 Left,Dorsal Foot: Increase protein intake. Wound #2 Right,Dorsal Foot: Increase protein intake. Wound #3 Left Calcaneus: Increase protein intake. Wound #4 Right Calcaneus: Harvie, Lakeya R. ([id number]) Increase protein intake. Wound #6 Right,Lateral,Posterior Lower Leg: Increase protein intake. Wound #7 Right,Lateral Malleolus: Increase protein intake. Home Health: Wound #1 Left,Dorsal Foot: [place] Nurse may visit PRN to address patient s wound care needs. FACE TO FACE ENCOUNTER: MEDICARE and MEDICAID PATIENTS: I certify that this patient is under my care and that I had a face-to-face  encounter that meets the physician face-to-face encounter requirements with this patient on this date. The encounter with the patient was in whole or in part for the following MEDICAL CONDITION: (primary reason for [place]) MEDICAL NECESSITY: I certify, that based on my findings, NURSING services are a medically necessary home health service. HOME BOUND STATUS: I certify that my clinical findings support that this patient is homebound (i.e., Due to illness or injury, pt requires aid of supportive devices such as crutches, cane, wheelchairs, walkers, the use of special transportation or the assistance of another person to leave their place of residence. There is a normal inability to leave the home and doing so requires considerable and taxing effort. Other absences are for medical reasons / religious services and are infrequent or of short duration when for other reasons). If current dressing causes regression in wound condition, may D/C ordered dressing product/s and  apply Normal Saline Moist Dressing daily until next [place] / Other MD appointment. [place] of regression in wound condition at [phone]. Please direct any NON-WOUND related issues/requests for orders to patient's Primary Care Physician Wound #2 Right,Dorsal Foot: [place] Nurse may visit PRN to address patient s wound care needs. FACE TO FACE ENCOUNTER: MEDICARE and MEDICAID PATIENTS: I certify that this patient is under my care and that I had a face-to-face encounter that meets the physician face-to-face encounter requirements with this patient on this date. The encounter with the patient was in whole or in part for the following MEDICAL CONDITION: (primary reason for [place]) MEDICAL NECESSITY: I certify, that based on my findings, NURSING services are a medically necessary home health service. HOME BOUND STATUS: I certify that my clinical findings support that this patient is homebound (i.e., Due to illness or injury, pt requires aid of supportive devices such as crutches, cane, wheelchairs, walkers, the use of special transportation or the assistance of another person to leave their place of residence. There is a normal inability to leave the home and doing so requires considerable and taxing effort. Other absences are for medical reasons / religious services and are infrequent or of short duration when for other reasons). If current dressing causes regression in wound condition, may D/C ordered dressing product/s and apply Normal Saline Moist Dressing daily until next [place] / Other MD appointment. [place] of regression in wound condition at [phone]. Please direct any NON-WOUND related issues/requests for orders to patient's Primary Care Physician Wound #3 Left Calcaneus: [place] Nurse may visit PRN to address patient s wound care needs. FACE  TO FACE ENCOUNTER: MEDICARE and MEDICAID PATIENTS: I certify that this patient is under my care and that I had a face-to-face encounter that meets the physician face-to-face encounter requirements with this patient on this date. The encounter with the patient was in whole or in part for the following MEDICAL CONDITION: (primary reason for [place]) MEDICAL NECESSITY: I certify, that based on my findings, NURSING services are a medically necessary home health service. HOME BOUND STATUS: I certify that my clinical findings support that this patient is homebound (i.e., Due to illness or injury, pt requires aid of supportive devices such as crutches, cane, wheelchairs, walkers, the use of special transportation or the assistance of another person to leave their place of residence. There is a Ivins, Nou R. ([id number]) normal inability to leave the home and doing so requires considerable and taxing effort.  Other absences are for medical reasons / religious services and are infrequent or of short duration when for other reasons). If current dressing causes regression in wound condition, may D/C ordered dressing product/s and apply Normal Saline Moist Dressing daily until next [place] / Other MD appointment. [place] of regression in wound condition at [phone]. Please direct any NON-WOUND related issues/requests for orders to patient's Primary Care Physician Wound #4 Right Calcaneus: [place] Nurse may visit PRN to address patient s wound care needs. FACE TO FACE ENCOUNTER: MEDICARE and MEDICAID PATIENTS: I certify that this patient is under my care and that I had a face-to-face encounter that meets the physician face-to-face encounter requirements with this patient on this date. The encounter with the patient was in whole or in part for the following MEDICAL CONDITION: (primary reason for [place]) MEDICAL NECESSITY: I  certify, that based on my findings, NURSING services are a medically necessary home health service. HOME BOUND STATUS: I certify that my clinical findings support that this patient is homebound (i.e., Due to illness or injury, pt requires aid of supportive devices such as crutches, cane, wheelchairs, walkers, the use of special transportation or the assistance of another person to leave their place of residence. There is a normal inability to leave the home and doing so requires considerable and taxing effort. Other absences are for medical reasons / religious services and are infrequent or of short duration when for other reasons). If current dressing causes regression in wound condition, may D/C ordered dressing product/s and apply Normal Saline Moist Dressing daily until next [place] / Other MD appointment. [place] of regression in wound condition at [phone]. Please direct any NON-WOUND related issues/requests for orders to patient's Primary Care Physician Wound #6 Right,Lateral,Posterior Lower Leg: [place] Nurse may visit PRN to address patient s wound care needs. FACE TO FACE ENCOUNTER: MEDICARE and MEDICAID PATIENTS: I certify that this patient is under my care and that I had a face-to-face encounter that meets the physician face-to-face encounter requirements with this patient on this date. The encounter with the patient was in whole or in part for the following MEDICAL CONDITION: (primary reason for [place]) MEDICAL NECESSITY: I certify, that based on my findings, NURSING services are a medically necessary home health service. HOME BOUND STATUS: I certify that my clinical findings support that this patient is homebound (i.e., Due to illness or injury, pt requires aid of supportive devices such as crutches, cane, wheelchairs, walkers, the use of special transportation or the assistance of another person to leave  their place of residence. There is a normal inability to leave the home and doing so requires considerable and taxing effort. Other absences are for medical reasons / religious services and are infrequent or of short duration when for other reasons). If current dressing causes regression in wound condition, may D/C ordered dressing product/s and apply Normal Saline Moist Dressing daily until next [place] / Other MD appointment. [place] of regression in wound condition at [phone]. Please direct any NON-WOUND related issues/requests for orders to patient's Primary Care Physician Wound #7 Right,Lateral Malleolus: [place] Nurse may visit PRN to address patient s wound care needs. FACE TO FACE ENCOUNTER: MEDICARE and MEDICAID PATIENTS: I certify that this patient is under my care and that I had a face-to-face encounter that meets the physician face-to-face  encounter requirements with this patient on this date. The encounter with the patient was in whole or in part for the following MEDICAL CONDITION: (primary reason for [place]) MEDICAL NECESSITY: I certify, that based on my findings, NURSING services are a medically necessary home health service. HOME BOUND STATUS: I certify that my clinical findings support that this patient is homebound (i.e., Due to illness or injury, pt requires aid of supportive devices such as crutches, cane, wheelchairs, walkers, the use of special transportation or the assistance of another person to leave their place of residence. There is a Schuneman, Jeryl R. ([id number]) normal inability to leave the home and doing so requires considerable and taxing effort. Other absences are for medical reasons / religious services and are infrequent or of short duration when for other reasons). If current dressing causes regression in wound condition, may D/C ordered dressing product/s and apply Normal Saline  Moist Dressing daily until next [place] / Other MD appointment. [place] of regression in wound condition at [phone]. Please direct any NON-WOUND related issues/requests for orders to patient's Primary Care Physician Medications-please add to medication list.: Wound #1 Left,Dorsal Foot: Other: - Vitamin C, Zinc, MVI Wound #2 Right,Dorsal Foot: Other: - Vitamin C, Zinc, MVI Wound #3 Left Calcaneus: Other: - Vitamin C, Zinc, MVI Wound #4 Right Calcaneus: Other: - Vitamin C, Zinc, MVI Wound #6 Right,Lateral,Posterior Lower Leg: Other: - Vitamin C, Zinc, MVI Wound #7 Right,Lateral Malleolus: Other: - Vitamin C, Zinc, MVI Consults ordered were: Vascular - Dr. Lucky Cowboy #1 extensive and long discussion about a left BKA. I have not burdened her with the possibility of a right BKA. However she has definite osteomyelitis on the left. She is a chronically unwell patient on with chronic renal failure however I think she would probably be stable for BKA or a BKA on both sides. Again she is ambivalent. I've told her and her husband that if we are not going to do just I think she needs IV antibiotics at dialysis. #2 on her right foot the dorsal wound has exposed tendon if I'm going to try to do something about this I'll likely need to remove the tendon. She also has exposed tendon on the medial heel on the right and the upper right lateral knee. #3 I think these wounds are multifactorial including brace injuries, wrap injuries and probably pressure ulcers on the heel #4 again she has osteomyelitis in the left foot and she is still struggling with the idea of the DKA versus medical therapy even though she is nonambulatory Electronic Signature(s) Signed: 02/09/2017 3:56:26 PM By: Linton Ham MD Entered By: Linton Ham on 02/09/2017 07:48:29 [name] ([id number]) -------------------------------------------------------------------------------- [place]  Details Patient Name: [name] Date of Service: 02/08/2017 Medical Record Patient Account Number: [id number] Number: Treating RN: Ahmed Prima 1934-09-21 (83 y.o. 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 without L97.515 evidence of necrosis L89.613 Pressure ulcer of right heel, stage 3 L89.620 Pressure ulcer of left heel, unstageable 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 Facility  Procedures CPT4 Code: [id number] Description: [phone] - WOUND CARE VISIT-LEV 5 EST PT Modifier: Quantity: 1 Physician Procedures CPT4: Description Modifier Quantity Code [id number] - WC PHYS LEVEL 4 - EST PT 1 ICD-10 Description Diagnosis L97.525 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

## 2017-02-10 NOTE — Progress Notes (Signed)
[name], EKINS ([id number]) Visit Report for 02/08/2017 Arrival Information Details Patient Name: [name], [name] Date of Service: 02/08/2017 12:30 PM Medical Record Patient Account Number: [id number] Number: Treating RN: Ahmed Prima 1934/11/08 (83 y.o. Other Clinician: Date of Birth/Sex: Female) Treating ROBSON, [place] Primary Care Adonys Wildes: Emily Filbert Lamis Behrmann/Extender: G Referring Galilee Pierron: Melina Modena in Treatment: 5 Visit Information History Since Last Visit All ordered tests and consults were completed: No Patient Arrived: Wheel Chair Added or deleted any medications: No Arrival Time: 12:33 Any new allergies or adverse reactions: No Accompanied By: husband Had a fall or experienced change in No activities of daily living that may affect Transfer Assistance: Harrel Lemon Lift risk of falls: Patient Identification Verified: Yes Signs or symptoms of abuse/neglect since last No Secondary Verification Process Yes visito Completed: Hospitalized since last visit: No Patient Requires Transmission-Based No Has Dressing in Place as Prescribed: Yes Precautions: Has Compression in Place as Prescribed: Yes Patient Has Alerts: Yes Pain Present Now: No Patient Alerts: DM II Electronic Signature(s) Signed: 02/08/2017 4:34:40 PM By: Alric Quan Entered By: Alric Quan on 02/08/2017 12:36:07 [name] ([id number]) -------------------------------------------------------------------------------- Clinic Level of Care Assessment Details Patient Name: [name] Date of Service: 02/08/2017 12:30 PM Medical Record Patient Account Number: [id number] Number: Treating RN: Ahmed Prima May 15, 1934 (83 y.o. Other Clinician: Date of Birth/Sex: Female) Treating ROBSON, [place] Primary Care Jaquila Santelli: Emily Filbert Braeden Kennan/Extender: G Referring Edris Schneck: Melina Modena in Treatment: 5 Clinic Level of Care Assessment Items TOOL 4 Quantity  Score X - Use when only an EandM is performed on FOLLOW-UP visit 1 0 ASSESSMENTS - Nursing Assessment / Reassessment X - Reassessment of Co-morbidities (includes updates in patient status) 1 10 X - Reassessment of Adherence to Treatment Plan 1 5 ASSESSMENTS - Wound and Skin Assessment / Reassessment []  - Simple Wound Assessment / Reassessment - one wound 0 X - Complex Wound Assessment / Reassessment - multiple wounds 7 5 []  - Dermatologic / Skin Assessment (not related to wound area) 0 ASSESSMENTS - Focused Assessment X - Circumferential Edema Measurements - multi extremities 2 5 []  - Nutritional Assessment / Counseling / Intervention 0 []  - Lower Extremity Assessment (monofilament, tuning fork, pulses) 0 []  - Peripheral Arterial Disease Assessment (using hand held doppler) 0 ASSESSMENTS - Ostomy and/or Continence Assessment and Care []  - Incontinence Assessment and Management 0 []  - Ostomy Care Assessment and Management (repouching, etc.) 0 PROCESS - Coordination of Care []  - Simple Patient / Family Education for ongoing care 0 X - Complex (extensive) Patient / Family Education for ongoing care 1 20 X - Staff obtains Programmer, systems, Records, Test Results / Process Orders 1 10 X - Staff telephones HHA, Nursing Homes / Clarify orders / etc 1 10 [name], Connie R. ([id number]) []  - Routine Transfer to another Facility (non-emergent condition) 0 []  - Routine Hospital Admission (non-emergent condition) 0 []  - New Admissions / Biomedical engineer / Ordering NPWT, Apligraf, etc. 0 []  - Emergency Hospital Admission (emergent condition) 0 X - Simple Discharge Coordination 1 10 []  - Complex (extensive) Discharge Coordination 0 PROCESS - Special Needs []  - Pediatric / Minor Patient Management 0 []  - Isolation Patient Management 0 []  - Hearing / Language / Visual special needs 0 []  - Assessment of Community assistance (transportation, D/C planning, etc.) 0 []  - Additional assistance / Altered  mentation 0 []  - Support Surface(s) Assessment (bed, cushion, seat, etc.) 0 INTERVENTIONS - Wound Cleansing / Measurement []  - Simple Wound Cleansing - one  wound 0 X - Complex Wound Cleansing - multiple wounds 7 5 X - Wound Imaging (photographs - any number of wounds) 1 5 []  - Wound Tracing (instead of photographs) 0 []  - Simple Wound Measurement - one wound 0 X - Complex Wound Measurement - multiple wounds 2 5 INTERVENTIONS - Wound Dressings []  - Small Wound Dressing one or multiple wounds 0 []  - Medium Wound Dressing one or multiple wounds 0 X - Large Wound Dressing one or multiple wounds 2 20 X - Application of Medications - topical 1 5 []  - Application of Medications - injection 0 [name], Connie R. ([id number]) INTERVENTIONS - Miscellaneous []  - External ear exam 0 []  - Specimen Collection (cultures, biopsies, blood, body fluids, etc.) 0 []  - Specimen(s) / Culture(s) sent or taken to Lab for analysis 0 X - Patient Transfer (multiple staff / Civil Service fast streamer / Similar devices) 1 10 []  - Simple Staple / Suture removal (25 or less) 0 []  - Complex Staple / Suture removal (26 or more) 0 []  - Hypo / Hyperglycemic Management (close monitor of Blood Glucose) 0 []  - Ankle / Brachial Index (ABI) - do not check if billed separately 0 X - Vital Signs 1 5 Has the patient been seen at the hospital within the last three years: Yes Total Score: 220 Level Of Care: New/Established - Level 5 Electronic Signature(s) Signed: 02/08/2017 4:34:40 PM By: Alric Quan Entered By: Alric Quan on 02/08/2017 16:33:58 [name] ([id number]) -------------------------------------------------------------------------------- Encounter Discharge Information Details Patient Name: [name] Date of Service: 02/08/2017 12:30 PM Medical Record Patient Account Number: [id number] Number: Treating RN: Ahmed Prima Sep 29, 1934 (83 y.o. Other Clinician: Date of Birth/Sex: Female) Treating ROBSON,  MICHAEL Primary Care Nikkolas Coomes: Emily Filbert Kenyetta Wimbish/Extender: G Referring Jadore Mcguffin: Melina Modena in Treatment: 5 Encounter Discharge Information Items Discharge Pain Level: 0 Discharge Condition: Stable Ambulatory Status: Wheelchair Discharge Destination: Home Transportation: Private Auto Accompanied By: husband Schedule Follow-up Appointment: Yes Medication Reconciliation completed and provided to Patient/Care No Armanda Forand: Provided on Clinical Summary of Care: 02/08/2017 Form Type Recipient Paper Patient MB Electronic Signature(s) Signed: 02/08/2017 1:56:30 PM By: Ruthine Dose Entered By: Ruthine Dose on 02/08/2017 13:56:30 [name] ([id number]) -------------------------------------------------------------------------------- Lower Extremity Assessment Details Patient Name: [name] Date of Service: 02/08/2017 12:30 PM Medical Record Patient Account Number: [id number] Number: Treating RN: Ahmed Prima 26-May-1934 (83 y.o. Other Clinician: Date of Birth/Sex: Female) Treating ROBSON, [place] Primary Care Shayan Bramhall: Emily Filbert Ammon Muscatello/Extender: G Referring Jourdon Zimmerle: Melina Modena in Treatment: 5 Edema Assessment Assessed: [Left: No] [Right: No] E[Left: dema] [Right: :] Calf Left: Right: Point of Measurement: 31 cm From Medial Instep 47.2 cm 48.5 cm Ankle Left: Right: Point of Measurement: 8 cm From Medial Instep 26.5 cm 28.6 cm Vascular Assessment Pulses: Dorsalis Pedis Palpable: [Left:No] [Right:No] Posterior Tibial Extremity colors, hair growth, and conditions: Extremity Color: [Left:Hyperpigmented] [Right:Hyperpigmented] Temperature of Extremity: [Left:Warm] [Right:Warm] Capillary Refill: [Left:> 3 seconds] [Right:> 3 seconds] Electronic Signature(s) Signed: 02/08/2017 4:34:40 PM By: Alric Quan Entered By: Alric Quan on 02/08/2017 12:58:38 [name]  ([id number]) -------------------------------------------------------------------------------- Multi Wound Chart Details Patient Name: [name] Date of Service: 02/08/2017 12:30 PM Medical Record Patient Account Number: [id number] Number: Treating RN: Ahmed Prima [birth year] (83 y.o. 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 the Lower Extremity Comorbid History: Sleep Apnea, Arrhythmia, Sleep Apnea, Arrhythmia, Sleep Apnea, Arrhythmia, Hypertension, Cirrhosis , Hypertension, Cirrhosis , Hypertension, Cirrhosis , Type II Diabetes, End Type II Diabetes, End Type II Diabetes, End Stage Renal Disease, Stage Renal Disease, Stage Renal Disease, Gout, Osteoarthritis Gout, Osteoarthritis Gout, Osteoarthritis Date Acquired: 11/04/2016 11/04/2016 11/04/2016 Weeks of Treatment: 5 5 5  Wound Status: Open Open Open Measurements L x W x D 3.4x5.2x0.6 5.5x3.2x0.3 3.4x7.8x0.1 (cm) Area (cm) : 13.886 13.823 20.829 Volume (cm) : 8.332 4.147 2.083 % Reduction in Area: 1.80% 26.70% 26.30% % Reduction in Volume: -194.70% -10.00% 26.30% Starting Position 1 11 (o'clock): 2 [place], Amariya R. ([id number]) Ending Position 1 (o'clock): Maximum Distance 1 1.1 (cm): Undermining: Yes No No Classification: Grade 1 Grade 2 Category/Stage II HBO Classification: N/A N/A Grade 1 Exudate Amount: Large Large Large Exudate Type:  Purulent Purulent Serous Exudate Color: yellow, brown, green yellow, brown, green amber Wound Margin: Well defined, not attached Distinct, outline attached Distinct, outline attached Granulation Amount: Medium (34-66%) Medium (34-66%) None Present (0%) Granulation Quality: Red, Pink Pink, Hyper-granulation N/A Necrotic Amount: Medium (34-66%) Medium (34-66%) Large (67-100%) Necrotic Tissue: Eschar, Adherent Slough Eschar, Adherent Slough Eschar Exposed Structures: Fat Layer (Subcutaneous Fat Layer (Subcutaneous N/A Tissue) Exposed: Yes Tissue) Exposed: Yes Fascia: No Fascia: No Tendon: No Tendon: No Muscle: No Muscle: No Joint: No Joint: No Bone: No Bone: No Epithelialization: None None None Periwound Skin Texture: Excoriation: Yes No Abnormalities Noted No Abnormalities Noted Induration: Yes Callus: Yes Crepitus: Yes Rash: Yes Scarring: Yes Periwound Skin Maceration: Yes Maceration: Yes No Abnormalities Noted Moisture: Dry/Scaly: Yes Periwound Skin Color: Atrophie Blanche: Yes Erythema: Yes Erythema: Yes Cyanosis: Yes Ecchymosis: Yes Erythema: Yes Hemosiderin Staining: Yes Mottled: Yes Pallor: Yes Rubor: Yes Erythema Location: N/A Circumferential Circumferential Temperature: No Abnormality No Abnormality No Abnormality Tenderness on Yes Yes Yes Palpation: Wound Preparation: Ulcer Cleansing: Other: Ulcer Cleansing: Ulcer Cleansing: soap and water Rinsed/Irrigated with Rinsed/Irrigated with Saline Saline Topical Anesthetic Applied: Other: lidocaine Topical Anesthetic Topical Anesthetic 4% Applied: Other: Applied: Other: LIDOCAINE 4% LIDOCAINE 4% BRIELLE, OZANNE ([id number]) Wound Number: [id number]  Photos: Wound Location: Right Calcaneus Right Lower Leg - Lateral, Right Malleolus - Lateral Posterior Wounding Event: Pressure Injury Gradually Appeared Gradually Appeared Primary Etiology: Pressure Ulcer Diabetic Wound/Ulcer of Diabetic Wound/Ulcer of the Lower  Extremity the Lower Extremity Comorbid History: Sleep Apnea, Arrhythmia, Sleep Apnea, Arrhythmia, Sleep Apnea, Arrhythmia, Hypertension, Cirrhosis , Hypertension, Cirrhosis , Hypertension, Cirrhosis , Type II Diabetes, End Type II Diabetes, End Type II Diabetes, End Stage Renal Disease, Stage Renal Disease, Stage Renal Disease, Gout, Osteoarthritis Gout, Osteoarthritis Gout, Osteoarthritis Date Acquired: 11/04/2016 11/04/2016 01/11/2017 Weeks of Treatment: 5 5 4  Wound Status: Open Open Open Measurements L x W x D 4.6x1.8x0.2 4.5x2.5x2.2 0.9x0.6x0.2 (cm) Area (cm) : 6.503 8.836 0.424 Volume (cm) : 1.301 19.439 0.085 % Reduction in Area: 66.90% 13.50% 15.70% % Reduction in Volume: 66.90% -26.90% 43.70% Undermining: No No No Classification: Category/Stage II Grade 2 Grade 2 HBO Classification: Grade 1 N/A N/A Exudate Amount: Large Large Large Exudate Type: Purulent Serosanguineous Serous Exudate Color: yellow, brown, green red, brown amber Wound Margin: Distinct, outline attached Distinct, outline attached Distinct, outline attached Granulation Amount: None Present (0%) Large (67-100%) None Present (0%) Granulation Quality: N/A  Red, Pink N/A Necrotic Amount: Large (67-100%) Small (1-33%) Large (67-100%) Necrotic Tissue: Eschar, Adherent Jette Exposed Structures: N/A N/A N/A Epithelialization: None None None Periwound Skin Texture: No Abnormalities Noted No Abnormalities Noted No Abnormalities Noted Periwound Skin No Abnormalities Noted Maceration: Yes No Abnormalities Noted Moisture: Periwound Skin Color: Erythema: Yes Erythema: Yes No Abnormalities Noted Erythema Location: Circumferential Circumferential N/A Temperature: No Abnormality No Abnormality N/A Tenderness on Yes Yes No Palpation: Wound Preparation: [name], [name] ([id number]) Ulcer Cleansing: Ulcer Cleansing: Ulcer Cleansing: Rinsed/Irrigated with Rinsed/Irrigated with  Rinsed/Irrigated with Saline Saline, Other: soap and Saline water Topical Anesthetic Topical Anesthetic Applied: Other: Topical Anesthetic Applied: Other: lidocaine LIDOCAINE 4% Applied: Other: 4% LIDOCAINE 4% Wound Number: 9 N/A N/A Photos: N/A N/A Wound Location: Left Metatarsal head first - N/A N/A Lateral Wounding Event: Gradually Appeared N/A N/A Primary Etiology: Diabetic Wound/Ulcer of N/A N/A the Lower Extremity Comorbid History: Sleep Apnea, Arrhythmia, N/A N/A Hypertension, Cirrhosis , Type II Diabetes, End Stage Renal Disease, Gout, Osteoarthritis Date Acquired: 02/01/2017 N/A N/A Weeks of Treatment: 1 N/A N/A Wound Status: Open N/A N/A Measurements L x W x D 0.4x0.6x0.1 N/A N/A (cm) Area (cm) : 0.188 N/A N/A Volume (cm) : 0.019 N/A N/A % Reduction in Area: -100.00% N/A N/A % Reduction in Volume: -111.10% N/A N/A Undermining: No N/A N/A Classification: Grade 2 N/A N/A HBO Classification: N/A N/A N/A Exudate Amount: Large N/A N/A Exudate Type: Serous N/A N/A Exudate Color: amber N/A N/A Wound Margin: Distinct, outline attached N/A N/A Granulation Amount: None Present (0%) N/A N/A Granulation Quality: N/A N/A N/A Necrotic Amount: Large (67-100%) N/A N/A Necrotic Tissue: Eschar N/A N/A Exposed Structures: N/A N/A N/A Epithelialization: N/A N/A N/A [name], Connie R. ([id number]) Periwound Skin Texture: No Abnormalities Noted N/A N/A Periwound Skin Maceration: Yes N/A N/A Moisture: Periwound Skin Color: No Abnormalities Noted N/A N/A Erythema Location: N/A N/A N/A Temperature: N/A N/A N/A Tenderness on Yes N/A N/A Palpation: Wound Preparation: Ulcer Cleansing: N/A N/A Rinsed/Irrigated with Saline, Other: soap and water Topical Anesthetic Applied: Other: lidocaine 4% Treatment Notes Wound #1 (Left, Dorsal Foot) 1. Cleansed with: Clean wound with Normal Saline Cleanse wound with antibacterial soap and water 2. Anesthetic Topical Lidocaine 4% cream  to wound bed prior to debridement 4. Dressing Applied: Iodoflex 5. Secondary Dressing Applied ABD Pad 7. Secured with Tape 3 Layer Compression System - Bilateral Wound #2 (Right, Dorsal Foot) 1. Cleansed with: Clean wound with Normal Saline Cleanse wound with antibacterial soap and water 2. Anesthetic Topical Lidocaine 4% cream to wound bed prior to debridement 4. Dressing Applied: Iodoflex 5. Secondary Dressing Applied ABD Pad 7. Secured with Tape 3 Layer Compression System - Bilateral Wound #3 (Left Calcaneus) [name], Connie R. ([id number]) 1. Cleansed with: Clean wound with Normal Saline Cleanse wound with antibacterial soap and water 2. Anesthetic Topical Lidocaine 4% cream to wound bed prior to debridement 4. Dressing Applied: Medihoney Gel 5. Secondary Dressing Applied Dry Gauze 7. Secured with Tape 3 Layer Compression System - Bilateral Notes heel cups Wound #4 (Right Calcaneus) 1. Cleansed with: Clean wound with Normal Saline Cleanse wound with antibacterial soap and water 2. Anesthetic Topical Lidocaine 4% cream to wound bed prior to debridement 4. Dressing Applied: Medihoney Gel 5. Secondary Dressing Applied Dry Gauze 7. Secured with Tape 3 Layer Compression System - Bilateral Notes heel cups Wound #6 (Right, Lateral, Posterior Lower Leg) 1. Cleansed with: Clean wound with Normal Saline Cleanse wound with antibacterial soap and water  2. Anesthetic Topical Lidocaine 4% cream to wound bed prior to debridement 4. Dressing Applied: Prisma Ag 5. Secondary Dressing Applied ABD Pad 7. Secured with Tape 3 Layer Compression System - Bilateral Wound #7 (Right, Lateral Malleolus) [name], Connie R. ([id number]) 1. Cleansed with: Clean wound with Normal Saline Cleanse wound with antibacterial soap and water 2. Anesthetic Topical Lidocaine 4% cream to wound bed prior to debridement 4. Dressing Applied: Prisma Ag 5. Secondary Dressing Applied ABD Pad 7.  Secured with Tape 3 Layer Compression System - Bilateral Wound #9 (Left, Lateral Metatarsal head first) 1. Cleansed with: Clean wound with Normal Saline Cleanse wound with antibacterial soap and water 2. Anesthetic Topical Lidocaine 4% cream to wound bed prior to debridement 4. Dressing Applied: Prisma Ag 5. Secondary Dressing Applied ABD Pad 7. Secured with Tape 3 Layer Compression System - Bilateral Electronic Signature(s) Signed: 02/09/2017 3:56:26 PM By: Linton Ham MD Entered By: Linton Ham on 02/09/2017 07:42:17 [name] ([id number]) -------------------------------------------------------------------------------- [place] Details Patient Name: [name] Date of Service: 02/08/2017 12:30 PM Medical Record Patient Account Number: [id number] Number: Treating RN: Ahmed Prima 1934-09-19 (83 y.o. Other Clinician: Date of Birth/Sex: Female) Treating ROBSON, [place] Primary Care Jaicob Dia: Emily Filbert Zauria Dombek/Extender: G Referring Tyrene Nader: Melina Modena in Treatment: 5 Active Inactive ` Abuse / Safety / Falls / Self Care Management Nursing Diagnoses: Potential for falls Goals: Patient will remain injury free Date Initiated: 01/04/2017 Target Resolution Date: 02/24/2017 Goal Status: Active Interventions: Assess fall risk on admission and as needed Assess self care needs on admission and as needed Notes: ` Nutrition Nursing Diagnoses: Imbalanced nutrition Impaired glucose control: actual or potential Potential for alteratiion in Nutrition/Potential for imbalanced nutrition Goals: Patient/caregiver agrees to and verbalizes understanding of need to use nutritional supplements and/or vitamins as prescribed Date Initiated: 01/04/2017 Target Resolution Date: 02/24/2017 Goal Status: Active Interventions: Assess patient nutrition upon admission and as needed per policy Notes: KHAILEE, BRY  ([id number]) Orientation to the Wound Care Program Nursing Diagnoses: Knowledge deficit related to the wound healing center program Goals: Patient/caregiver will verbalize understanding of the [place] Program Date Initiated: 01/04/2017 Target Resolution Date: 02/24/2017 Goal Status: Active Interventions: Provide education on orientation to the wound center Notes: ` Pain, Acute or Chronic Nursing Diagnoses: Pain, acute or chronic: actual or potential Potential alteration in comfort, pain Goals: Patient will verbalize adequate pain control and receive pain control interventions during procedures as needed Date Initiated: 01/04/2017 Target Resolution Date: 02/24/2017 Goal Status: Active Interventions: Assess comfort goal upon admission Complete pain assessment as per visit requirements Notes: ` Wound/Skin Impairment Nursing Diagnoses: Impaired tissue integrity Goals: Ulcer/skin breakdown will have a volume reduction of 80% by week 12 Date Initiated: 01/04/2017 Target Resolution Date: 02/24/2017 Goal Status: Active Interventions: Assess patient/caregiver ability to perform ulcer/skin care regimen upon admission and as needed [name], [name] ([id number]) Notes: Electronic Signature(s) Signed: 02/08/2017 4:34:40 PM By: Alric Quan Entered By: Alric Quan on 02/08/2017 12:58:49 [name] ([id number]) -------------------------------------------------------------------------------- Pain Assessment Details Patient Name: [name] Date of Service: 02/08/2017 12:30 PM Medical Record Patient Account Number: [id number] Number: Treating RN: Ahmed Prima 1934-01-06 (83 y.o. Other Clinician: Date of Birth/Sex: Female) Treating ROBSON, MICHAEL Primary Care Jeanpaul Biehl: Emily Filbert Konner Saiz/Extender: G Referring Tanav Orsak: Melina Modena in Treatment: 5 Active Problems Location of Pain Severity and Description of Pain Patient Has Paino  No Site Locations With Dressing Change: No Pain Management and Medication Current Pain Management: Electronic Signature(s)  Signed: 02/08/2017 4:34:40 PM By: Alric Quan Entered By: Alric Quan on 02/08/2017 12:36:16 [name] ([id number]) -------------------------------------------------------------------------------- Patient/Caregiver Education Details Patient Name: [name] Date of Service: 02/08/2017 12:30 PM Medical Record Patient Account Number: [id number] Number: Treating RN: Ahmed Prima 08-06-1934 (83 y.o. Other Clinician: Date of Birth/Gender: Female) Treating ROBSON, [place] Primary Care Physician: Emily Filbert Physician/Extender: G Referring Physician: Melina Modena in Treatment: 5 Education Assessment Education Provided To: Patient Education Topics Provided Wound/Skin Impairment: Handouts: Other: change dressing as ordered and do not get dressing wet Methods: Demonstration, Explain/Verbal Responses: State content correctly Electronic Signature(s) Signed: 02/08/2017 4:34:40 PM By: Alric Quan Entered By: Alric Quan on 02/08/2017 13:02:24 [name] ([id number]) -------------------------------------------------------------------------------- Wound Assessment Details Patient Name: [name] Date of Service: 02/08/2017 12:30 PM Medical Record Patient Account Number: [id number] Number: Treating RN: Ahmed Prima 12-21-1933 (83 y.o. Other Clinician: Date of Birth/Sex: Female) Treating ROBSON, [place] Primary Care Laquasha Groome: Emily Filbert Fedrick Cefalu/Extender: G Referring Shela Esses: Melina Modena in Treatment: 5 Wound Status Wound Number: 1 Primary Diabetic Wound/Ulcer of the Lower Etiology: Extremity Wound Location: Left Foot - Dorsal Wound Open Wounding Event: Gradually Appeared Status: Date Acquired: 11/04/2016 Comorbid Sleep Apnea, Arrhythmia, Hypertension, Weeks Of Treatment: 5 History:  Cirrhosis , Type II Diabetes, End Stage Clustered Wound: No Renal Disease, Gout, Osteoarthritis Photos Photo Uploaded By: Alric Quan on 02/08/2017 15:54:16 Wound Measurements Length: (cm) 3.4 % Reduction in Width: (cm) 5.2 % Reduction in Depth: (cm) 0.6 Epithelializati Area: (cm) 13.886 Tunneling: Volume: (cm) 8.332 Undermining: Starting Pos Ending Posit Maximum Dist Area: 1.8% Volume: -194.7% on: None No Yes ition (o'clock): 11 ion (o'clock): 2 ance: (cm) 1.1 Wound Description Classification: Grade 1 Foul Odor After Wound Margin: Well defined, not attached Slough/Fibrino Exudate Amount: Large Exudate Type: Purulent Exudate Color: yellow, brown, green [place], Vinetta R. ([id number]) Cleansing: No Yes Wound Bed Granulation Amount: Medium (34-66%) Exposed Structure Granulation Quality: Red, Pink Fascia Exposed: No Necrotic Amount: Medium (34-66%) Fat Layer (Subcutaneous Tissue) Exposed: Yes Necrotic Quality: Eschar, Adherent Slough Tendon Exposed: No Muscle Exposed: No Joint Exposed: No Bone Exposed: No Periwound Skin Texture Texture Color No Abnormalities Noted: No No Abnormalities Noted: No Callus: Yes Atrophie Blanche: Yes Crepitus: Yes Cyanosis: Yes Excoriation: Yes Ecchymosis: Yes Induration: Yes Erythema: Yes Rash: Yes Hemosiderin Staining: Yes Scarring: Yes Mottled: Yes Pallor: Yes Moisture Rubor: Yes No Abnormalities Noted: No Dry / Scaly: Yes Temperature / Pain Maceration: Yes Temperature: No Abnormality Tenderness on Palpation: Yes Wound Preparation Ulcer Cleansing: Other: soap and water, Topical Anesthetic Applied: Other: lidocaine 4%, Treatment Notes Wound #1 (Left, Dorsal Foot) 1. Cleansed with: Clean wound with Normal Saline Cleanse wound with antibacterial soap and water 2. Anesthetic Topical Lidocaine 4% cream to wound bed prior to debridement 4. Dressing Applied: Iodoflex 5. Secondary Dressing Applied ABD Pad 7.  Secured with Tape 3 Layer Compression System - Bilateral Electronic Signature(s) Signed: 02/08/2017 4:34:40 PM By: Alric Quan Entered By: Alric Quan on 02/08/2017 12:51:14 [name] ([id number]) [name], [name] ([id number]) -------------------------------------------------------------------------------- Wound Assessment Details Patient Name: [name] Date of Service: 02/08/2017 12:30 PM Medical Record Patient Account Number: [id number] Number: Treating RN: Ahmed Prima 02/04/1934 (83 y.o. Other Clinician: Date of Birth/Sex: Female) Treating ROBSON, MICHAEL Primary Care Eppie Barhorst: Emily Filbert Clancy Mullarkey/Extender: G Referring Curtisha Bendix: Melina Modena in Treatment: 5 Wound Status Wound Number: 2 Primary Diabetic Wound/Ulcer of the Lower Etiology: Extremity Wound Location: Right Foot - Dorsal Wound Open Wounding Event: Gradually Appeared Status:  Date Acquired: 11/04/2016 Comorbid Sleep Apnea, Arrhythmia, Hypertension, Weeks Of Treatment: 5 History: Cirrhosis , Type II Diabetes, End Stage Clustered Wound: No Renal Disease, Gout, Osteoarthritis Photos Photo Uploaded By: Alric Quan on 02/08/2017 15:54:16 Wound Measurements Length: (cm) 5.5 Width: (cm) 3.2 Depth: (cm) 0.3 Area: (cm) 13.823 Volume: (cm) 4.147 % Reduction in Area: 26.7% % Reduction in Volume: -10% Epithelialization: None Tunneling: No Undermining: No Wound Description Classification: Grade 2 Foul Odor Aft Wound Margin: Distinct, outline attached Slough/Fibrin Exudate Amount: Large Exudate Type: Purulent Exudate Color: yellow, brown, green er Cleansing: No o Yes Wound Bed Granulation Amount: Medium (34-66%) Exposed Structure Granulation Quality: Pink, Hyper-granulation Fascia Exposed: No Miedema, Shelaine R. ([id number]) Necrotic Amount: Medium (34-66%) Fat Layer (Subcutaneous Tissue) Exposed: Yes Necrotic Quality: Eschar, Adherent Slough Tendon Exposed:  No Muscle Exposed: No Joint Exposed: No Bone Exposed: No Periwound Skin Texture Texture Color No Abnormalities Noted: No No Abnormalities Noted: No Erythema: Yes Moisture Erythema Location: Circumferential No Abnormalities Noted: No Maceration: Yes Temperature / Pain Temperature: No Abnormality Tenderness on Palpation: Yes Wound Preparation Ulcer Cleansing: Rinsed/Irrigated with Saline Topical Anesthetic Applied: Other: LIDOCAINE 4%, Treatment Notes Wound #2 (Right, Dorsal Foot) 1. Cleansed with: Clean wound with Normal Saline Cleanse wound with antibacterial soap and water 2. Anesthetic Topical Lidocaine 4% cream to wound bed prior to debridement 4. Dressing Applied: Iodoflex 5. Secondary Dressing Applied ABD Pad 7. Secured with Tape 3 Layer Compression System - Bilateral Electronic Signature(s) Signed: 02/08/2017 4:34:40 PM By: Alric Quan Entered By: Alric Quan on 02/08/2017 12:51:44 [name] ([id number]) -------------------------------------------------------------------------------- Wound Assessment Details Patient Name: [name] Date of Service: 02/08/2017 12:30 PM Medical Record Patient Account Number: [id number] Number: Treating RN: Ahmed Prima 1934-07-26 (83 y.o. Other Clinician: Date of Birth/Sex: Female) Treating ROBSON, [place] Primary Care Kaenan Jake: Emily Filbert Nadina Fomby/Extender: G Referring Jyllian Haynie: Melina Modena in Treatment: 5 Wound Status Wound Number: 3 Primary Pressure Ulcer Etiology: Wound Location: Left Calcaneus Wound Open Wounding Event: Pressure Injury Status: Date Acquired: 11/04/2016 Comorbid Sleep Apnea, Arrhythmia, Hypertension, Weeks Of Treatment: 5 History: Cirrhosis , Type II Diabetes, End Stage Clustered Wound: No Renal Disease, Gout, Osteoarthritis Photos Photo Uploaded By: Alric Quan on 02/08/2017 15:54:39 Wound Measurements Length: (cm) 3.4 Width: (cm) 7.8 Depth: (cm)  0.1 Area: (cm) 20.829 Volume: (cm) 2.083 % Reduction in Area: 26.3% % Reduction in Volume: 26.3% Epithelialization: None Tunneling: No Undermining: No Wound Description Classification: Category/Stage II Foul O Diabetic Severity (Wagner): Grade 1 Slough Wound Margin: Distinct, outline attached Exudate Amount: Large Exudate Type: Serous Exudate Color: amber dor After Cleansing: No /Fibrino No Wound Bed Granulation Amount: None Present (0%) [name], Connie R. ([id number]) Necrotic Amount: Large (67-100%) Necrotic Quality: Eschar Periwound Skin Texture Texture Color No Abnormalities Noted: No No Abnormalities Noted: No Erythema: Yes Moisture Erythema Location: Circumferential No Abnormalities Noted: No Temperature / Pain Temperature: No Abnormality Tenderness on Palpation: Yes Wound Preparation Ulcer Cleansing: Rinsed/Irrigated with Saline Topical Anesthetic Applied: Other: LIDOCAINE 4%, Treatment Notes Wound #3 (Left Calcaneus) 1. Cleansed with: Clean wound with Normal Saline Cleanse wound with antibacterial soap and water 2. Anesthetic Topical Lidocaine 4% cream to wound bed prior to debridement 4. Dressing Applied: Medihoney Gel 5. Secondary Dressing Applied Dry Gauze 7. Secured with Tape 3 Layer Compression System - Bilateral Notes heel cups Electronic Signature(s) Signed: 02/08/2017 4:34:40 PM By: Alric Quan Entered By: Alric Quan on 02/08/2017 12:52:23 [name] ([id number]) -------------------------------------------------------------------------------- Wound Assessment Details Patient Name: [name] Date of Service: 02/08/2017 12:30  PM Medical Record Patient Account Number: [id number] Number: Treating RN: Ahmed Prima 1934-12-13 (83 y.o. Other Clinician: Date of Birth/Sex: Female) Treating ROBSON, [place] Primary Care Orrin Yurkovich: Emily Filbert Taegen Lennox/Extender: G Referring Karianne Nogueira: Melina Modena in Treatment:  5 Wound Status Wound Number: 4 Primary Pressure Ulcer Etiology: Wound Location: Right Calcaneus Wound Open Wounding Event: Pressure Injury Status: Date Acquired: 11/04/2016 Comorbid Sleep Apnea, Arrhythmia, Hypertension, Weeks Of Treatment: 5 History: Cirrhosis , Type II Diabetes, End Stage Clustered Wound: No Renal Disease, Gout, Osteoarthritis Photos Photo Uploaded By: Alric Quan on 02/08/2017 15:54:39 Wound Measurements Length: (cm) 4.6 Width: (cm) 1.8 Depth: (cm) 0.2 Area: (cm) 6.503 Volume: (cm) 1.301 % Reduction in Area: 66.9% % Reduction in Volume: 66.9% Epithelialization: None Tunneling: No Undermining: No Wound Description Classification: Category/Stage II Foul O Diabetic Severity (Wagner): Grade 1 Slough Wound Margin: Distinct, outline attached Exudate Amount: Large Exudate Type: Purulent Exudate Color: yellow, brown, green dor After Cleansing: No /Fibrino No Wound Bed Granulation Amount: None Present (0%) [name], Connie R. ([id number]) Necrotic Amount: Large (67-100%) Necrotic Quality: Eschar, Adherent Slough Periwound Skin Texture Texture Color No Abnormalities Noted: No No Abnormalities Noted: No Erythema: Yes Moisture Erythema Location: Circumferential No Abnormalities Noted: No Temperature / Pain Temperature: No Abnormality Tenderness on Palpation: Yes Wound Preparation Ulcer Cleansing: Rinsed/Irrigated with Saline Topical Anesthetic Applied: Other: LIDOCAINE 4%, Treatment Notes Wound #4 (Right Calcaneus) 1. Cleansed with: Clean wound with Normal Saline Cleanse wound with antibacterial soap and water 2. Anesthetic Topical Lidocaine 4% cream to wound bed prior to debridement 4. Dressing Applied: Medihoney Gel 5. Secondary Dressing Applied Dry Gauze 7. Secured with Tape 3 Layer Compression System - Bilateral Notes heel cups Electronic Signature(s) Signed: 02/08/2017 4:34:40 PM By: Alric Quan Entered By: Alric Quan  on 02/08/2017 12:53:10 [name] ([id number]) -------------------------------------------------------------------------------- Wound Assessment Details Patient Name: [name] Date of Service: 02/08/2017 12:30 PM Medical Record Patient Account Number: [id number] Number: Treating RN: Ahmed Prima August 25, 1934 (83 y.o. Other Clinician: Date of Birth/Sex: Female) Treating ROBSON, [place] Primary Care Fynn Adel: Emily Filbert Casondra Gasca/Extender: G Referring Eugen Jeansonne: Melina Modena in Treatment: 5 Wound Status Wound Number: 6 Primary Diabetic Wound/Ulcer of the Lower Etiology: Extremity Wound Location: Right Lower Leg - Lateral, Posterior Wound Open Status: Wounding Event: Gradually Appeared Comorbid Sleep Apnea, Arrhythmia, Hypertension, Date Acquired: 11/04/2016 History: Cirrhosis , Type II Diabetes, End Stage Weeks Of Treatment: 5 Renal Disease, Gout, Osteoarthritis Clustered Wound: No Photos Photo Uploaded By: Alric Quan on 02/08/2017 15:55:05 Wound Measurements Length: (cm) 4.5 Width: (cm) 2.5 Depth: (cm) 2.2 Area: (cm) 8.836 Volume: (cm) 19.439 % Reduction in Area: 13.5% % Reduction in Volume: -26.9% Epithelialization: None Tunneling: No Undermining: No Wound Description Classification: Grade 2 Foul Odor Aft Wound Margin: Distinct, outline attached Slough/Fibrin Exudate Amount: Large Exudate Type: Serosanguineous Exudate Color: red, brown er Cleansing: No o No Wound Bed Granulation Amount: Large (67-100%) Granulation Quality: Red, Pink [name], Connie R. ([id number]) Necrotic Amount: Small (1-33%) Necrotic Quality: Adherent Slough Periwound Skin Texture Texture Color No Abnormalities Noted: No No Abnormalities Noted: No Erythema: Yes Moisture Erythema Location: Circumferential No Abnormalities Noted: No Maceration: Yes Temperature / Pain Temperature: No Abnormality Tenderness on Palpation: Yes Wound Preparation Ulcer  Cleansing: Rinsed/Irrigated with Saline, Other: soap and water, Topical Anesthetic Applied: Other: LIDOCAINE 4%, Treatment Notes Wound #6 (Right, Lateral, Posterior Lower Leg) 1. Cleansed with: Clean wound with Normal Saline Cleanse wound with antibacterial soap and water 2. Anesthetic Topical Lidocaine 4% cream to wound bed prior to debridement  4. Dressing Applied: Prisma Ag 5. Secondary Dressing Applied ABD Pad 7. Secured with Tape 3 Layer Compression System - Bilateral Electronic Signature(s) Signed: 02/08/2017 4:34:40 PM By: Alric Quan Entered By: Alric Quan on 02/08/2017 12:54:16 [name] ([id number]) -------------------------------------------------------------------------------- Wound Assessment Details Patient Name: [name] Date of Service: 02/08/2017 12:30 PM Medical Record Patient Account Number: [id number] Number: Treating RN: Ahmed Prima 10-02-1934 (83 y.o. Other Clinician: Date of Birth/Sex: Female) Treating ROBSON, [place] Primary Care Avelynn Sellin: Emily Filbert Lazarius Rivkin/Extender: G Referring Shelagh Rayman: Melina Modena in Treatment: 5 Wound Status Wound Number: 7 Primary Diabetic Wound/Ulcer of the Lower Etiology: Extremity Wound Location: Right Malleolus - Lateral Wound Open Wounding Event: Gradually Appeared Status: Date Acquired: 01/11/2017 Comorbid Sleep Apnea, Arrhythmia, Hypertension, Weeks Of Treatment: 4 History: Cirrhosis , Type II Diabetes, End Stage Clustered Wound: No Renal Disease, Gout, Osteoarthritis Photos Photo Uploaded By: Alric Quan on 02/08/2017 15:55:06 Wound Measurements Length: (cm) 0.9 Width: (cm) 0.6 Depth: (cm) 0.2 Area: (cm) 0.424 Volume: (cm) 0.085 % Reduction in Area: 15.7% % Reduction in Volume: 43.7% Epithelialization: None Tunneling: No Undermining: No Wound Description Classification: Grade 2 Foul Odor Afte Wound Margin: Distinct, outline attached  Slough/Fibrino Exudate Amount: Large Exudate Type: Serous Exudate Color: amber r Cleansing: No No Wound Bed Granulation Amount: None Present (0%) Necrotic Amount: Large (67-100%) [name], Jaanai R. ([id number]) Necrotic Quality: Adherent Slough Periwound Skin Texture Texture Color No Abnormalities Noted: No No Abnormalities Noted: No Moisture No Abnormalities Noted: No Wound Preparation Ulcer Cleansing: Rinsed/Irrigated with Saline Topical Anesthetic Applied: Other: lidocaine 4%, Treatment Notes Wound #7 (Right, Lateral Malleolus) 1. Cleansed with: Clean wound with Normal Saline Cleanse wound with antibacterial soap and water 2. Anesthetic Topical Lidocaine 4% cream to wound bed prior to debridement 4. Dressing Applied: Prisma Ag 5. Secondary Dressing Applied ABD Pad 7. Secured with Tape 3 Layer Compression System - Bilateral Electronic Signature(s) Signed: 02/08/2017 4:34:40 PM By: Alric Quan Entered By: Alric Quan on 02/08/2017 12:55:27 [name] ([id number]) -------------------------------------------------------------------------------- Wound Assessment Details Patient Name: [name] Date of Service: 02/08/2017 12:30 PM Medical Record Patient Account Number: [id number] Number: Treating RN: Ahmed Prima 1934/04/30 (83 y.o. Other Clinician: Date of Birth/Sex: Female) Treating ROBSON, [place] Primary Care Miyako Oelke: Emily Filbert Kaden Dunkel/Extender: G Referring Kaliann Coryell: Melina Modena in Treatment: 5 Wound Status Wound Number: 9 Primary Diabetic Wound/Ulcer of the Lower Etiology: Extremity Wound Location: Left Metatarsal head first - Lateral Wound Open Status: Wounding Event: Gradually Appeared Comorbid Sleep Apnea, Arrhythmia, Hypertension, Date Acquired: 02/01/2017 History: Cirrhosis , Type II Diabetes, End Stage Weeks Of Treatment: 1 Renal Disease, Gout, Osteoarthritis Clustered Wound: No Photos Photo Uploaded By:  Alric Quan on 02/08/2017 15:55:34 Wound Measurements Length: (cm) 0.4 Width: (cm) 0.6 Depth: (cm) 0.1 Area: (cm) 0.188 Volume: (cm) 0.019 % Reduction in Area: -100% % Reduction in Volume: -111.1% Tunneling: No Undermining: No Wound Description Classification: Grade 2 Foul Odor Afte Wound Margin: Distinct, outline attached Slough/Fibrino Exudate Amount: Large Exudate Type: Serous Exudate Color: amber r Cleansing: No No Wound Bed Granulation Amount: None Present (0%) Necrotic Amount: Large (67-100%) Meinders, Shauniece R. ([id number]) Necrotic Quality: Eschar Periwound Skin Texture Texture Color No Abnormalities Noted: No No Abnormalities Noted: No Moisture Temperature / Pain No Abnormalities Noted: No Tenderness on Palpation: Yes Maceration: Yes Wound Preparation Ulcer Cleansing: Rinsed/Irrigated with Saline, Other: soap and water, Topical Anesthetic Applied: Other: lidocaine 4%, Treatment Notes Wound #9 (Left, Lateral Metatarsal head first) 1. Cleansed with: Clean wound with Normal Saline Cleanse  wound with antibacterial soap and water 2. Anesthetic Topical Lidocaine 4% cream to wound bed prior to debridement 4. Dressing Applied: Prisma Ag 5. Secondary Dressing Applied ABD Pad 7. Secured with Tape 3 Layer Compression System - Bilateral Electronic Signature(s) Signed: 02/08/2017 4:34:40 PM By: Alric Quan Entered By: Alric Quan on 02/08/2017 12:55:44 [name] ([id number]) -------------------------------------------------------------------------------- [place] Details Patient Name: [name] Date of Service: 02/08/2017 12:30 PM Medical Record Patient Account Number: [id number] Number: Treating RN: Ahmed Prima 01-22-1934 (83 y.o. Other Clinician: Date of Birth/Sex: Female) Treating ROBSON, MICHAEL Primary Care Rashada Klontz: Emily Filbert Amarius Toto/Extender: G Referring Tynesha Free: Melina Modena in Treatment: 5 Vital  Signs Time Taken: 12:36 Temperature (F): 98.4 Height (in): 61 Pulse (bpm): 82 Weight (lbs): 206 Respiratory Rate (breaths/min): 16 Body Mass Index (BMI): 38.9 Blood Pressure (mmHg): 102/59 Reference Range: 80 - 120 mg / dl Electronic Signature(s) Signed: 02/08/2017 4:34:40 PM By: Alric Quan Entered By: Alric Quan on 02/08/2017 12:36:42

## 2017-02-15 ENCOUNTER — Encounter (INDEPENDENT_AMBULATORY_CARE_PROVIDER_SITE_OTHER): Payer: Self-pay | Admitting: Vascular Surgery

## 2017-02-15 ENCOUNTER — Encounter: Payer: Medicare Other | Attending: Internal Medicine | Admitting: Internal Medicine

## 2017-02-15 ENCOUNTER — Ambulatory Visit (INDEPENDENT_AMBULATORY_CARE_PROVIDER_SITE_OTHER): Payer: Medicare Other | Admitting: Vascular Surgery

## 2017-02-15 VITALS — BP 92/57 | HR 101 | Resp 16

## 2017-02-15 DIAGNOSIS — L89613 Pressure ulcer of right heel, stage 3: Secondary | ICD-10-CM | POA: Insufficient documentation

## 2017-02-15 DIAGNOSIS — Z96653 Presence of artificial knee joint, bilateral: Secondary | ICD-10-CM | POA: Diagnosis not present

## 2017-02-15 DIAGNOSIS — E669 Obesity, unspecified: Secondary | ICD-10-CM | POA: Diagnosis not present

## 2017-02-15 DIAGNOSIS — I890 Lymphedema, not elsewhere classified: Secondary | ICD-10-CM | POA: Diagnosis not present

## 2017-02-15 DIAGNOSIS — Z880 Allergy status to penicillin: Secondary | ICD-10-CM | POA: Insufficient documentation

## 2017-02-15 DIAGNOSIS — I120 Hypertensive chronic kidney disease with stage 5 chronic kidney disease or end stage renal disease: Secondary | ICD-10-CM | POA: Insufficient documentation

## 2017-02-15 DIAGNOSIS — Z85828 Personal history of other malignant neoplasm of skin: Secondary | ICD-10-CM | POA: Diagnosis not present

## 2017-02-15 DIAGNOSIS — N186 End stage renal disease: Secondary | ICD-10-CM

## 2017-02-15 DIAGNOSIS — E785 Hyperlipidemia, unspecified: Secondary | ICD-10-CM | POA: Diagnosis not present

## 2017-02-15 DIAGNOSIS — Z6838 Body mass index (BMI) 38.0-38.9, adult: Secondary | ICD-10-CM | POA: Diagnosis not present

## 2017-02-15 DIAGNOSIS — K7460 Unspecified cirrhosis of liver: Secondary | ICD-10-CM | POA: Insufficient documentation

## 2017-02-15 DIAGNOSIS — E119 Type 2 diabetes mellitus without complications: Secondary | ICD-10-CM

## 2017-02-15 DIAGNOSIS — L89620 Pressure ulcer of left heel, unstageable: Secondary | ICD-10-CM | POA: Diagnosis not present

## 2017-02-15 DIAGNOSIS — I4891 Unspecified atrial fibrillation: Secondary | ICD-10-CM | POA: Diagnosis not present

## 2017-02-15 DIAGNOSIS — Z992 Dependence on renal dialysis: Secondary | ICD-10-CM

## 2017-02-15 DIAGNOSIS — E11621 Type 2 diabetes mellitus with foot ulcer: Secondary | ICD-10-CM | POA: Diagnosis not present

## 2017-02-15 DIAGNOSIS — E1142 Type 2 diabetes mellitus with diabetic polyneuropathy: Secondary | ICD-10-CM | POA: Insufficient documentation

## 2017-02-15 DIAGNOSIS — L97525 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 without evidence of necrosis: Secondary | ICD-10-CM | POA: Diagnosis not present

## 2017-02-15 DIAGNOSIS — L97515 Non-pressure chronic ulcer of other part of right foot with muscle involvement without evidence of necrosis: Secondary | ICD-10-CM | POA: Insufficient documentation

## 2017-02-15 DIAGNOSIS — L97213 Non-pressure chronic ulcer of right calf with necrosis of muscle: Secondary | ICD-10-CM | POA: Insufficient documentation

## 2017-02-15 DIAGNOSIS — Z794 Long term (current) use of insulin: Secondary | ICD-10-CM | POA: Insufficient documentation

## 2017-02-15 DIAGNOSIS — M109 Gout, unspecified: Secondary | ICD-10-CM | POA: Insufficient documentation

## 2017-02-15 DIAGNOSIS — E1122 Type 2 diabetes mellitus with diabetic chronic kidney disease: Secondary | ICD-10-CM | POA: Insufficient documentation

## 2017-02-15 DIAGNOSIS — M1990 Unspecified osteoarthritis, unspecified site: Secondary | ICD-10-CM | POA: Insufficient documentation

## 2017-02-16 NOTE — Progress Notes (Signed)
[name], [name] ([id number]) Visit Report for 02/15/2017 Chief Complaint Document Details Patient Name: [name], [name] Date of Service: 02/15/2017 12:30 PM Medical Record Patient Account Number: [id number] Number: Treating RN: [name] 1934-08-09 (83 y.o. Other Clinician: Date of Birth/Sex: Female) Treating [name] Primary Care Provider: Emily Tucker Provider/Extender: G Referring Provider: Melina Tucker in Treatment: 6 Information Obtained from: Patient Chief Complaint 01/04/17; patient is here for review of wounds on her bilateral lower extremities referred from her primary physician Dr. Emily [facility] Electronic Signature(s) Signed: 02/16/2017 8:02:19 AM By: Linton Ham MD Entered By: Linton Ham on 02/16/2017 07:36:00 [name] ([id number]) -------------------------------------------------------------------------------- HPI Details Patient Name: [name] Date of Service: 02/15/2017 12:30 PM Medical Record Patient Account Number: [id number] Number: Treating RN: [name] 1934-03-03 (83 y.o. Other Clinician: Date of Birth/Sex: Female) Treating [name] Primary Care Provider: Emily Tucker Provider/Extender: G Referring Provider: Melina Tucker in Treatment: 6 History of Present Illness HPI Description: 01/04/17; this is a 83 year old fairly disabled woman who comes accompanied by her husband today. She is here for review of wounds on her bilateral feet and right calf. There is a hopeful note from [name] that is included. The patient is a type II diabetic with end-stage renal failure on dialysis, she is here for bilateral wounds in her feet and heels as well as the right calf wound posteriorly. The history here is a bit difficult to follow. Her husband states that she had a fracture of her leg in October. Indeed looking through [facility] she had a closed nondisplaced fracture of the  lateral condyle of the right femur. This was managed nonoperatively with a knee brace. Her husband thinks that the brace itself caused the wounds on the right leg although that would not of caused ones on the left. Apparently the wounds have been there for 2 months and the patient is being followed by [facility] with bilateral Unna boots. She is also followed in [name] office of vascular surgery. She had recent arterial studies that showed biphasic waveforms bilaterally with a ABI on the right of 1.01 on the left at 0.93. T ABIs on the right at 0.72 and on the left at 0.75. He was not felt to have significant arterial disease. I am also not clear what [facility] was primarily dressing the wounds with under the wraps the patient had an admission on 10/17 with an acute right leg DVT status post IVC filter placement and was placed on Eliquis for 6 weeks only. 01/11/17; the x-rays that I ordered last week on this patient have been completed the left is the problem area. She is had a previous distal tib-fib fracture which appears to be better than the last imaging view I can find in September. She appears to have osteomyelitis of the lateral aspect of the talus. Left foot showed bone resorption and loss of cortical Along the medial margin of the navicular and medial cuneiform consistent with on the osteomyelitis. rRght foot and ankle showed diffuse soft tissue swelling without obvious focal bone destruction there was a suggestion of a right fourth metatarsal fracture. boen I actually tried to get more history out of the patient and her husband who is present I've also spoken to her son on the phone. It would appear that the deep area on the lateral aspect of her right knee was abrasive injury also the area on the lateral aspect just above the ankle.  The she also probably consistent with pressure ulcers and dorsal ankle extensive wounds probably wrap injuries although I am guessing  about some of this. She does not have an arterial issue per [name]. Her left tib-fib fracture was apparently in April as well she had a left femur fracture sometime in 2014 2015. She has a rod in her left femur hopefully we can get her through an MRI. I looked through [facility] back to 2009 I don't actually see an x-ray of the left femur. 01/18/17; her MRI is booked for February 13. We had some trouble with home health and the dressings that were prescribed so they left the current wrap on all week. There is increasing erythema on the medial aspect of the right foot compatible with cellulitis she will need an antibiotic [name], [name] ([id number]) 01/25/17; patient's MRI is actually booked for February 15 not used Dragon. This is a patient that has multiple bilateral lower extremity wounds. I have never been completely certain how these happened and in fact they may be multifactorial. I have suspected that the area on her right lateral upper leg, right lateral lower leg were probably brace injuries. She has 2 large wounds with exposed tendon on her dorsal feet bilaterally. I suspect these were wrap injuries. She has pressure ulcers on her bilateral heels. She does not have significant arterial insufficiency and is previously been seen by [facility]. A plain x-ray of her left foot suggested osteomyelitis which is the reason for the MRI. I gave her doxycycline last week for left foot cellulitis which looks better. 02/01/17; her MRI of the left foot showed diffuse cellulitis and mild fasciitis without drainable soft tissue abscess or pyomyositis. She does however have osteomyelitis involving the medial navicular bone, proximal aspect of the medial cuneiform medially and also possible involvement of the distal medial aspect of the talus. The left foot continues to have a large wound on the anterior foot with exposed tendon and a unstageable wound on the right heel covered in a thick black eschar.  Extensive discussion with the patient today. Probably the better option is a left BKA. She is nonambulatory. With regards to her right foot and leg the wounds are stable. There is necrotic tendon in the right anterior dorsal foot, exposed tendon in the right Achilles area and a very deep wound with exposed tendon on the lateral aspect of her right knee. Nevertheless all of these wounds looks somewhat viable to me. I did not MRI of the right leg as the x-ray did not show evidence of osteomyelitis 02/08/17; with regards to her right foot including the dorsal foot and the right heel this is about the same. MRI shows underlying osteomyelitis again an extensive discussion about amputation the patient still seems ambivalent. The areas over the left dorsal foot left medial ankle and the deep wound over the right lateral knee are stable to improved. Given her reasonably normal vascularity I think this foot and limb or potentially salvageable. X-ray of her foot on the right did not show osteomyelitis. I have urged her to consider a left BKA 02/15/17 patient returns today. Saw [name] this morning. Apparently the understanding that the patient has is that there is no urgency to the amputation. While this is technically true the patient has underlying osteomyelitis with extensive tissue loss in her left foot. I have not actually talked to Dr. Lucky Cowboy or seen his note and I'll wait to look at this and call him if necessary.  Meantime there is no major change to the wounds which in this case actually is probably a good thing since there is high risk of deterioration in this nonambulatory woman Electronic Signature(s) Signed: 02/16/2017 8:02:19 AM By: Linton Ham MD Entered By: Linton Ham on 02/16/2017 07:38:14 [name] ([id number]) -------------------------------------------------------------------------------- Physical Exam Details Patient Name: [name] Date of Service: 02/15/2017 12:30  PM Medical Record Patient Account Number: [id number] Number: Treating RN: [name] Apr 14, 1934 (83 y.o. Other Clinician: Date of Birth/Sex: Female) Treating Tonisha Silvey Primary Care Provider: Emily Tucker Provider/Extender: G Referring Provider: Melina Tucker in Treatment: 6 Constitutional Patient is hypotensive. Looks stable to dialyzing via shunt in the right arm. Pulse regular and within target range for patient.Marland Kitchen Respirations regular, non-labored and within target range.. Temperature is normal and within the target range for the patient.. Patient's appearance is neat and clean. Appears in no acute distress. Well nourished and well developed.. Eyes Conjunctivae clear. No discharge.Marland Kitchen Respiratory Respiratory effort is easy and symmetric bilaterally. Rate is normal at rest and on room air.. Cardiovascular Pedal pulses are palpable bilaterally. There is not an arterial issue.. Minimal edema in both legs. Lymphatic Nonpalpable in the popliteal or inguinal area.. Musculoskeletal There is no evidence of an acute arthritis in either ankle. Psychiatric No evidence of depression, anxiety, or agitation. Calm, cooperative, and communicative. Appropriate interactions and affect.. Notes Wound exam; oThe patient's bilateral dorsal foot wounds are in roughly the same condition with necrotic tendon on the surface. Any attempt to heal this would mean sacrificing the tendons which is probably not horrible outcome oShe has an extensive necrotic eschar over the left heel which is progressively softer each week. I have not actually seen the tissue underneath this. oOn the medial right heel the wound has exposed tendon as well but the granulation looks healthy. oOn the lateral right knee we have had continued improvement in this wound. Certainly looks smaller but still a deep wound. Electronic Signature(s) Signed: 02/16/2017 8:02:19 AM By: Linton Ham MD Entered By:  Linton Ham on 02/16/2017 07:41:05 [name] ([id number]) -------------------------------------------------------------------------------- Physician Orders Details Patient Name: [name] Date of Service: 02/15/2017 12:30 PM Medical Record Patient Account Number: [id number] Number: Treating RN: [name] [birth year] (83 y.o. Other Clinician: Date of Birth/Sex: Female) Treating Syriana Croslin Primary Care Provider: Emily Tucker Provider/Extender: G Referring Provider: Melina Tucker in Treatment: 6 Verbal / Phone Orders: Yes Clinician: Carolyne Fiscal, Debi Read Back and Verified: Yes Diagnosis Coding Wound Cleansing Wound #1 Left,Dorsal Foot o Clean wound with Normal Saline. o Cleanse wound with mild soap and water Wound #2 Right,Dorsal Foot o Clean wound with Normal Saline. o Cleanse wound with mild soap and water Wound #3 Left Calcaneus o Clean wound with Normal Saline. o Cleanse wound with mild soap and water Wound #4 Right Calcaneus o Clean wound with Normal Saline. o Cleanse wound with mild soap and water Wound #6 Right,Lateral,Posterior Lower Leg o Clean wound with Normal Saline. o Cleanse wound with mild soap and water Wound #7 Right,Lateral Malleolus o Clean wound with Normal Saline. o Cleanse wound with mild soap and water Anesthetic Wound #1 Left,Dorsal Foot o Topical Lidocaine 4% cream applied to wound bed prior to debridement - for clinic use only Wound #2 Right,Dorsal Foot o Topical Lidocaine 4% cream applied to wound bed prior to debridement - for clinic use only Wound #3 Left Calcaneus o Topical Lidocaine 4% cream applied to wound bed prior to debridement - for  clinic use only Wound #4 Right Calcaneus [name], [name]. ([id number]) o Topical Lidocaine 4% cream applied to wound bed prior to debridement - for clinic use only Wound #6 Right,Lateral,Posterior Lower Leg o Topical Lidocaine 4% cream  applied to wound bed prior to debridement - for clinic use only Wound #7 Right,Lateral Malleolus o Topical Lidocaine 4% cream applied to wound bed prior to debridement - for clinic use only Skin Barriers/Peri-Wound Care Wound #6 Right,Lateral,Posterior Lower Leg o Skin Prep Primary Wound Dressing Wound #1 Left,Dorsal Foot o Iodoflex Wound #2 Right,Dorsal Foot o Iodoflex Wound #3 Left Calcaneus o Medihoney gel Wound #4 Right Calcaneus o Medihoney gel Wound #6 Right,Lateral,Posterior Lower Leg o Prisma Ag Wound #7 Right,Lateral Malleolus o Prisma Ag Secondary Dressing Wound #1 Left,Dorsal Foot o ABD pad o Dry Gauze o Drawtex Wound #2 Right,Dorsal Foot o ABD pad o Dry Gauze o Drawtex Wound #3 Left Calcaneus o ABD pad o Dry Gauze o Other - heel cup Wound #4 Right Calcaneus [name], Connie R. ([id number]) o ABD pad o Dry Gauze o Other - heel cup Wound #6 Right,Lateral,Posterior Lower Leg o Dry Gauze o Boardered Foam Dressing o Drawtex Wound #7 Right,Lateral Malleolus o ABD pad o Dry Gauze Dressing Change Frequency Wound #1 Left,Dorsal Foot o Three times weekly - pt will have this changed on [facility]uesdays in the [facility] Wound #2 Right,Dorsal Foot o Three times weekly - pt will have this changed on Tuesdays in the [facility] Wound #3 Left Calcaneus o Three times weekly - pt will have this changed on Tuesdays in the [facility] Wound #4 Right Calcaneus o Three times weekly - pt will have this changed on Tuesdays in the [facility] Wound #6 Right,Lateral,Posterior Lower Leg o Three times weekly - pt will have this changed on Tuesdays in the [facility] Wound #7 Right,Lateral Malleolus o Three times weekly - pt will have this changed on Tuesdays in the [facility] Follow-up Appointments Wound #1 Left,Dorsal Foot o Return Appointment in 1 week. Wound #2 Right,Dorsal  Foot o Return Appointment in 1 week. Wound #3 Left Calcaneus o Return Appointment in 1 week. Wound #4 Right Calcaneus o Return Appointment in 1 week. Wound #6 Right,Lateral,Posterior Lower Leg o Return Appointment in 1 week. [name], [name] ([id number]) Edema Control Wound #1 Left,Dorsal Foot o 3 Layer Compression System - Bilateral o Elevate legs to the level of the heart and pump ankles as often as possible Wound #2 Right,Dorsal Foot o 3 Layer Compression System - Bilateral o Elevate legs to the level of the heart and pump ankles as often as possible Wound #3 Left Calcaneus o 3 Layer Compression System - Bilateral o Elevate legs to the level of the heart and pump ankles as often as possible Wound #4 Right Calcaneus o 3 Layer Compression System - Bilateral o Elevate legs to the level of the heart and pump ankles as often as possible Wound #6 Right,Lateral,Posterior Lower Leg o 3 Layer Compression System - Bilateral o Elevate legs to the level of the heart and pump ankles as often as possible Wound #7 Right,Lateral Malleolus o 3 Layer Compression System - Bilateral o Elevate legs to the level of the heart and pump ankles as often as possible Off-Loading Wound #1 Left,Dorsal Foot o Turn and reposition every 2 hours o Other: - float heels above heart level while in the bed Wound #2 Right,Dorsal Foot o Turn and reposition every 2 hours o  Other: - float heels above heart level while in the bed Wound #3 Left Calcaneus o Turn and reposition every 2 hours o Other: - float heels above heart level while in the bed Wound #4 Right Calcaneus o Turn and reposition every 2 hours o Other: - float heels above heart level while in the bed Wound #6 Right,Lateral,Posterior Lower Leg o Turn and reposition every 2 hours o Other: - float heels above heart level while in the bed Wound #7 Right,Lateral Malleolus o Turn and reposition every 2  hours o Other: - float heels above heart level while in the bed Tucker, Connie R. ([id number]) Additional Orders / Instructions Wound #1 Left,Dorsal Foot o Increase protein intake. Wound #2 Right,Dorsal Foot o Increase protein intake. Wound #3 Left Calcaneus o Increase protein intake. Wound #4 Right Calcaneus o Increase protein intake. Wound #6 Right,Lateral,Posterior Lower Leg o Increase protein intake. Wound #7 Right,Lateral Malleolus o Increase protein intake. Home Health Wound #1 [place] Visits o Home Health Nurse may visit PRN to address patientos wound care needs. o FACE TO FACE ENCOUNTER: MEDICARE and MEDICAID PATIENTS: I certify that this patient is under my care and that I had a face-to-face encounter that meets the physician face-to-face encounter requirements with this patient on this date. The encounter with the patient was in whole or in part for the following MEDICAL CONDITION: (primary reason for [place]) MEDICAL NECESSITY: I certify, that based on my findings, NURSING services are a medically necessary home health service. HOME BOUND STATUS: I certify that my clinical findings support that this patient is homebound (i.e., Due to illness or injury, pt requires aid of supportive devices such as crutches, cane, wheelchairs, walkers, the use of special transportation or the assistance of another person to leave their place of residence. There is a normal inability to leave the home and doing so requires considerable and taxing effort. Other absences are for medical reasons / religious services and are infrequent or of short duration when for other reasons). o If current dressing causes regression in wound condition, may D/C ordered dressing product/s and apply Normal Saline Moist Dressing daily until next [place] / Other MD appointment. [place] of regression in wound condition at  [phone]. o Please direct any NON-WOUND related issues/requests for orders to patient's Primary Care Physician Wound #2 [place] Nurse may visit PRN to address patientos wound care needs. o FACE TO FACE ENCOUNTER: MEDICARE and MEDICAID PATIENTS: I certify that this patient is under my care and that I had a face-to-face encounter that meets the physician face-to-face encounter requirements with this patient on this date. The encounter with the patient was in whole or in part for the following MEDICAL CONDITION: (primary reason for [place]) MASON, BURLEIGH ([id number]) MEDICAL NECESSITY: I certify, that based on my findings, NURSING services are a medically necessary home health service. HOME BOUND STATUS: I certify that my clinical findings support that this patient is homebound (i.e., Due to illness or injury, pt requires aid of supportive devices such as crutches, cane, wheelchairs, walkers, the use of special transportation or the assistance of another person to leave their place of residence. There is a normal inability to leave the home and doing so requires considerable and taxing effort. Other absences are for medical reasons / religious services and are infrequent or of short duration when for other reasons). o If current dressing  causes regression in wound condition, may D/C ordered dressing product/s and apply Normal Saline Moist Dressing daily until next [place] / Other MD appointment. [place] of regression in wound condition at [phone]. o Please direct any NON-WOUND related issues/requests for orders to patient's Primary Care Physician Wound #3 Left [place] Nurse may visit PRN to address patientos wound care needs. o FACE TO FACE ENCOUNTER: MEDICARE and MEDICAID PATIENTS: I certify that this patient is under my care  and that I had a face-to-face encounter that meets the physician face-to-face encounter requirements with this patient on this date. The encounter with the patient was in whole or in part for the following MEDICAL CONDITION: (primary reason for [place]) MEDICAL NECESSITY: I certify, that based on my findings, NURSING services are a medically necessary home health service. HOME BOUND STATUS: I certify that my clinical findings support that this patient is homebound (i.e., Due to illness or injury, pt requires aid of supportive devices such as crutches, cane, wheelchairs, walkers, the use of special transportation or the assistance of another person to leave their place of residence. There is a normal inability to leave the home and doing so requires considerable and taxing effort. Other absences are for medical reasons / religious services and are infrequent or of short duration when for other reasons). o If current dressing causes regression in wound condition, may D/C ordered dressing product/s and apply Normal Saline Moist Dressing daily until next [place] / Other MD appointment. [place] of regression in wound condition at [phone]. o Please direct any NON-WOUND related issues/requests for orders to patient's Primary Care Physician Wound #4 Right [place] Nurse may visit PRN to address patientos wound care needs. o FACE TO FACE ENCOUNTER: MEDICARE and MEDICAID PATIENTS: I certify that this patient is under my care and that I had a face-to-face encounter that meets the physician face-to-face encounter requirements with this patient on this date. The encounter with the patient was in whole or in part for the following MEDICAL CONDITION: (primary reason for [place]) MEDICAL NECESSITY: I certify, that based on my findings, NURSING services are a medically necessary home health service.  HOME BOUND STATUS: I certify that my clinical findings support that this patient is homebound (i.e., Due to illness or injury, pt requires aid of supportive devices such as crutches, cane, wheelchairs, walkers, the use of special transportation or the assistance of another person to leave their place of residence. There is a normal inability to leave the home and doing so requires considerable and taxing effort. Other absences are for medical reasons / religious services and are infrequent or of short duration when for other reasons). ABBYGAEL, CURTISS R. ([id number]) o If current dressing causes regression in wound condition, may D/C ordered dressing product/s and apply Normal Saline Moist Dressing daily until next [place] / Other MD appointment. [place] of regression in wound condition at [phone]. o Please direct any NON-WOUND related issues/requests for orders to patient's Primary Care Physician Wound #6 [place] Nurse may visit PRN to address patientos wound care needs. o FACE TO FACE ENCOUNTER: MEDICARE and MEDICAID PATIENTS: I certify that this patient is under my care and that I had a face-to-face encounter that meets the physician face-to-face encounter requirements with this patient on this date.  The encounter with the patient was in whole or in part for the following MEDICAL CONDITION: (primary reason for [place]) MEDICAL NECESSITY: I certify, that based on my findings, NURSING services are a medically necessary home health service. HOME BOUND STATUS: I certify that my clinical findings support that this patient is homebound (i.e., Due to illness or injury, pt requires aid of supportive devices such as crutches, cane, wheelchairs, walkers, the use of special transportation or the assistance of another person to leave their place of residence. There is a normal  inability to leave the home and doing so requires considerable and taxing effort. Other absences are for medical reasons / religious services and are infrequent or of short duration when for other reasons). o If current dressing causes regression in wound condition, may D/C ordered dressing product/s and apply Normal Saline Moist Dressing daily until next [place] / Other MD appointment. [place] of regression in wound condition at [phone]. o Please direct any NON-WOUND related issues/requests for orders to patient's Primary Care Physician Wound #7 [place] Nurse may visit PRN to address patientos wound care needs. o FACE TO FACE ENCOUNTER: MEDICARE and MEDICAID PATIENTS: I certify that this patient is under my care and that I had a face-to-face encounter that meets the physician face-to-face encounter requirements with this patient on this date. The encounter with the patient was in whole or in part for the following MEDICAL CONDITION: (primary reason for [place]) MEDICAL NECESSITY: I certify, that based on my findings, NURSING services are a medically necessary home health service. HOME BOUND STATUS: I certify that my clinical findings support that this patient is homebound (i.e., Due to illness or injury, pt requires aid of supportive devices such as crutches, cane, wheelchairs, walkers, the use of special transportation or the assistance of another person to leave their place of residence. There is a normal inability to leave the home and doing so requires considerable and taxing effort. Other absences are for medical reasons / religious services and are infrequent or of short duration when for other reasons). o If current dressing causes regression in wound condition, may D/C ordered dressing product/s and apply Normal Saline Moist Dressing daily until next [place] / Other MD appointment. [place] of regression in wound condition at [phone]. o Please direct any NON-WOUND related issues/requests for orders to patient's Primary Care Physician Medications-please add to medication list. Wound #1 Left,Dorsal Foot [name], CAMPOSANO. ([id number]) o Other: - Vitamin C, Zinc, MVI Wound #2 Right,Dorsal Foot o Other: - Vitamin C, Zinc, MVI Wound #3 Left Calcaneus o Other: - Vitamin C, Zinc, MVI Wound #4 Right Calcaneus o Other: - Vitamin C, Zinc, MVI Wound #6 Right,Lateral,Posterior Lower Leg o Other: - Vitamin C, Zinc, MVI Wound #7 Right,Lateral Malleolus o Other: - Vitamin C, Zinc, MVI Electronic Signature(s) Signed: 02/15/2017 3:56:24 PM By: Alric Quan Signed: 02/16/2017 8:02:19 AM By: Linton Ham MD Entered By: Alric Quan on 02/15/2017 13:23:31 [name] ([id number]) -------------------------------------------------------------------------------- Problem List Details Patient Name: [name] Date of Service: 02/15/2017 12:30 PM Medical Record Patient Account Number: [id number] Number: Treating RN: [name] 01-30-1934 (83 y.o. Other Clinician: Date of Birth/Sex: Female) Treating Elfrieda Espino Primary Care Provider: Emily Tucker Provider/Extender: G Referring Provider: Melina Tucker in Treatment: 6 Active Problems ICD-10 Encounter Code Description Active Date Diagnosis E11.621 Type 2 diabetes mellitus with foot ulcer 01/04/2017  Yes L97.525 Non-pressure chronic ulcer of other part of left foot with 01/04/2017 Yes muscle involvement without evidence of necrosis L97.515 Non-pressure chronic ulcer of other part of right foot with 01/04/2017 Yes muscle involvement without evidence of necrosis L89.613 Pressure ulcer of right heel, stage 3 01/04/2017 Yes L89.620 Pressure ulcer of left heel, unstageable 01/04/2017 Yes L97.213 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 Other Clinician: Date of Birth/Sex: Female) Treating Elaina Cara Primary Care Provider: Emily Tucker Provider/Extender: G Referring Provider: Melina Tucker in Treatment: 6 Subjective Chief Complaint Information obtained from Patient 01/04/17; patient is here for review of wounds on her bilateral lower extremities referred from her primary physician Dr. Emily [facility] History of Present Illness (HPI) 01/04/17; this is a 83 year old fairly disabled woman who comes accompanied by her husband today. She is here for review of wounds on her bilateral feet and right calf. There is a hopeful note from [name] that is included. The patient is a type II diabetic with end-stage renal failure on dialysis, she is here for bilateral wounds in her feet and heels as well as the right calf wound posteriorly. The history here is a bit difficult to follow. Her husband states that she had a fracture of her leg in October. Indeed looking through [facility] she had a closed nondisplaced fracture of the lateral condyle of the right femur. This was managed nonoperatively with a knee brace. Her husband thinks that the brace itself caused the wounds on the right leg although that would not of caused ones on the left. Apparently the wounds have been there for 2 months and  the patient is being followed by [facility] with bilateral Unna boots. She is also followed in [name] office of vascular surgery. She had recent arterial studies that showed biphasic waveforms bilaterally with a ABI on the right of 1.01 on the left at 0.93. T ABIs on the right at 0.72 and on the left at 0.75. He was not felt to have significant arterial disease. I am also not clear what [facility] was primarily dressing the wounds with under the wraps the patient had an admission on 10/17 with an acute right leg DVT status post IVC filter placement and was placed on Eliquis for 6 weeks only. 01/11/17; the x-rays that I ordered last week on this patient have been completed the left is the problem area. She is had a previous distal tib-fib fracture which appears to be better than the last imaging view I can find in September. She appears to have osteomyelitis of the lateral aspect of the talus. Left foot showed bone resorption and loss of cortical Along the medial margin of the navicular and medial cuneiform consistent with on the osteomyelitis. rRght foot and ankle showed diffuse soft tissue swelling without obvious focal bone destruction there was a suggestion of a right fourth metatarsal fracture. boen I actually tried to get more history out of the patient and her husband who is present I've also spoken to her son on the phone.  It would appear that the deep area on the lateral aspect of her right knee was abrasive injury also the area on the lateral aspect just above the ankle. The she also probably consistent with pressure ulcers and dorsal ankle extensive wounds probably wrap injuries although I am guessing about some of this. She does not have an arterial issue per [name]. Her left tib-fib fracture was apparently in April Farrel[place], [place] ([id number]) as well she had a left femur fracture sometime in 2014 2015. She has a rod in her left femur hopefully we can get her through an  MRI. I looked through [facility] back to 2009 I don't actually see an x-ray of the left femur. 01/18/17; her MRI is booked for February 13. We had some trouble with home health and the dressings that were prescribed so they left the current wrap on all week. There is increasing erythema on the medial aspect of the right foot compatible with cellulitis she will need an antibiotic 01/25/17; patient's MRI is actually booked for February 15 not used Dragon. This is a patient that has multiple bilateral lower extremity wounds. I have never been completely certain how these happened and in fact they may be multifactorial. I have suspected that the area on her right lateral upper leg, right lateral lower leg were probably brace injuries. She has 2 large wounds with exposed tendon on her dorsal feet bilaterally. I suspect these were wrap injuries. She has pressure ulcers on her bilateral heels. She does not have significant arterial insufficiency and is previously been seen by [facility]. A plain x-ray of her left foot suggested osteomyelitis which is the reason for the MRI. I gave her doxycycline last week for left foot cellulitis which looks better. 02/01/17; her MRI of the left foot showed diffuse cellulitis and mild fasciitis without drainable soft tissue abscess or pyomyositis. She does however have osteomyelitis involving the medial navicular bone, proximal aspect of the medial cuneiform medially and also possible involvement of the distal medial aspect of the talus. The left foot continues to have a large wound on the anterior foot with exposed tendon and a unstageable wound on the right heel covered in a thick black eschar. Extensive discussion with the patient today. Probably the better option is a left BKA. She is nonambulatory. With regards to her right foot and leg the wounds are stable. There is necrotic tendon in the right anterior dorsal foot, exposed tendon in the right Achilles area and a  very deep wound with exposed tendon on the lateral aspect of her right knee. Nevertheless all of these wounds looks somewhat viable to me. I did not MRI of the right leg as the x-ray did not show evidence of osteomyelitis 02/08/17; with regards to her right foot including the dorsal foot and the right heel this is about the same. MRI shows underlying osteomyelitis again an extensive discussion about amputation the patient still seems ambivalent. The areas over the left dorsal foot left medial ankle and the deep wound over the right lateral knee are stable to improved. Given her reasonably normal vascularity I think this foot and limb or potentially salvageable. X-ray of her foot on the right did not show osteomyelitis. I have urged her to consider a left BKA 02/15/17 patient returns today. Saw [name] this morning. Apparently the understanding that the patient has is that there is no urgency to the amputation. While this is technically true the patient has underlying osteomyelitis with extensive tissue  loss in her left foot. I have not actually talked to Dr. Lucky Cowboy or seen his note and I'll wait to look at this and call him if necessary. Meantime there is no major change to the wounds which in this case actually is probably a good thing since there is high risk of deterioration in this nonambulatory woman Objective Constitutional Patient is hypotensive. Looks stable to dialyzing via shunt in the right arm. Pulse regular and within target range for patient.Marland Kitchen Respirations regular, non-labored and within target range.. Temperature is normal and within the target range for the patient.. Patient's appearance is neat and clean. Appears in no acute [name], Connie R. ([id number]) distress. Well nourished and well developed.. Vitals Time Taken: 12:35 PM, Height: 61 in, Weight: 206 lbs, BMI: 38.9, Temperature: 97.7 F, Pulse: 87 bpm, Respiratory Rate: 16 breaths/min, Blood Pressure: 96/48  mmHg. Eyes Conjunctivae clear. No discharge.Marland Kitchen Respiratory Respiratory effort is easy and symmetric bilaterally. Rate is normal at rest and on room air.. Cardiovascular Pedal pulses are palpable bilaterally. There is not an arterial issue.. Minimal edema in both legs. Lymphatic Nonpalpable in the popliteal or inguinal area.. Musculoskeletal There is no evidence of an acute arthritis in either ankle. Psychiatric No evidence of depression, anxiety, or agitation. Calm, cooperative, and communicative. Appropriate interactions and affect.. General Notes: Wound exam; The patient's bilateral dorsal foot wounds are in roughly the same condition with necrotic tendon on the surface. Any attempt to heal this would mean sacrificing the tendons which is probably not horrible outcome She has an extensive necrotic eschar over the left heel which is progressively softer each week. I have not actually seen the tissue underneath this. On the medial right heel the wound has exposed tendon as well but the granulation looks healthy. On the lateral right knee we have had continued improvement in this wound. Certainly looks smaller but still a deep wound. Integumentary (Hair, Skin) Wound #1 status is Open. Original cause of wound was Gradually Appeared. The wound is located on the Left,Dorsal Foot. The wound measures 3.2cm length x 5.4cm width x 0.6cm depth; 13.572cm^2 area and 8.143cm^3 volume. There is Fat Layer (Subcutaneous Tissue) Exposed exposed. There is no tunneling noted, however, there is undermining starting at 12:00 and ending at 2:00 with a maximum distance of 1.3cm. There is a large amount of serosanguineous drainage noted. The wound margin is well defined and not attached to the wound base. There is medium (34-66%) red, pink granulation within the wound bed. There is a medium (34-66%) amount of necrotic tissue within the wound bed including Eschar and Adherent Slough. The periwound skin appearance  exhibited: Callus, Crepitus, Excoriation, Induration, Rash, Scarring, Dry/Scaly, Maceration, Atrophie Blanche, Cyanosis, Ecchymosis, Hemosiderin Staining, Mottled, Pallor, Rubor, Erythema. The surrounding wound skin color is noted with erythema. Periwound temperature was noted as No Abnormality. The periwound has tenderness on palpation. Wound #2 status is Open. Original cause of wound was Gradually Appeared. The wound is located on the Right,Dorsal Foot. The wound measures 6.3cm length x 3cm width x 0.2cm depth; 14.844cm^2 area and 2.969cm^3 volume. There is Fat Layer (Subcutaneous Tissue) Exposed exposed. There is no tunneling or undermining noted. There is a large amount of purulent drainage noted. The wound margin is distinct with the outline attached to the wound base. There is medium (34-66%) pink granulation within the wound bed. [name], Connie R. ([id number]) There is a medium (34-66%) amount of necrotic tissue within the wound bed including Eschar and Adherent Slough. The periwound skin appearance exhibited:  Maceration, Erythema. The surrounding wound skin color is noted with erythema which is circumferential. Periwound temperature was noted as No Abnormality. The periwound has tenderness on palpation. Wound #3 status is Open. Original cause of wound was Pressure Injury. The wound is located on the Left Calcaneus. The wound measures 3.5cm length x 7.8cm width x 0.1cm depth; 21.441cm^2 area and 2.144cm^3 volume. There is no tunneling or undermining noted. There is a large amount of serous drainage noted. The wound margin is distinct with the outline attached to the wound base. There is no granulation within the wound bed. There is a large (67-100%) amount of necrotic tissue within the wound bed including Eschar. The periwound skin appearance exhibited: Erythema. The surrounding wound skin color is noted with erythema which is circumferential. Periwound temperature was noted as No Abnormality.  The periwound has tenderness on palpation. Wound #4 status is Open. Original cause of wound was Pressure Injury. The wound is located on the Right Calcaneus. The wound measures 3cm length x 1cm width x 0.4cm depth; 2.356cm^2 area and 0.942cm^3 volume. There is no tunneling or undermining noted. There is a large amount of serosanguineous drainage noted. The wound margin is distinct with the outline attached to the wound base. There is no granulation within the wound bed. There is a large (67-100%) amount of necrotic tissue within the wound bed including Eschar and Adherent Slough. The periwound skin appearance exhibited: Erythema. The surrounding wound skin color is noted with erythema which is circumferential. Periwound temperature was noted as No Abnormality. The periwound has tenderness on palpation. Wound #6 status is Open. Original cause of wound was Gradually Appeared. The wound is located on the Right,Lateral,Posterior Lower Leg. The wound measures 5cm length x 2.2cm width x 1.8cm depth; 8.639cm^2 area and 15.551cm^3 volume. There is no tunneling or undermining noted. There is a large amount of serosanguineous drainage noted. The wound margin is distinct with the outline attached to the wound base. There is large (67-100%) red, pink granulation within the wound bed. There is a small (1-33%) amount of necrotic tissue within the wound bed including Adherent Slough. The periwound skin appearance exhibited: Maceration, Erythema. The surrounding wound skin color is noted with erythema which is circumferential. Periwound temperature was noted as No Abnormality. The periwound has tenderness on palpation. Wound #7 status is Open. Original cause of wound was Gradually Appeared. The wound is located on the Right,Lateral Malleolus. The wound measures 1cm length x 0.9cm width x 0.2cm depth; 0.707cm^2 area and 0.141cm^3 volume. There is no tunneling or undermining noted. There is a large amount of  serous drainage noted. The wound margin is distinct with the outline attached to the wound base. There is no granulation within the wound bed. There is a large (67-100%) amount of necrotic tissue within the wound bed including Adherent Slough. Wound #9 status is Open. Original cause of wound was Gradually Appeared. The wound is located on the Left,Lateral Metatarsal head first. The wound measures 0.6cm length x 0.8cm width x 0.1cm depth; 0.377cm^2 area and 0.038cm^3 volume. There is no tunneling or undermining noted. There is a large amount of serosanguineous drainage noted. The wound margin is distinct with the outline attached to the wound base. There is no granulation within the wound bed. There is a large (67-100%) amount of necrotic tissue within the wound bed including Eschar. The periwound skin appearance exhibited: Maceration. The periwound has tenderness on palpation. 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 Cleanse wound with mild soap and water Wound #2 Right,Dorsal Foot: Clean wound with Normal Saline. Cleanse wound with mild soap and water Wound #3 Left Calcaneus: Clean wound with Normal Saline. Cleanse wound with mild soap and water Wound #4 Right Calcaneus: Clean wound with Normal Saline. Cleanse wound with mild soap and water Wound #6  Right,Lateral,Posterior Lower Leg: Clean wound with Normal Saline. Cleanse wound with mild soap and water Wound #7 Right,Lateral Malleolus: Clean wound with Normal Saline. Cleanse wound with mild soap and water Anesthetic: Wound #1 Left,Dorsal Foot: Topical Lidocaine 4% cream applied to wound bed prior to debridement - for clinic use only Wound #2 Right,Dorsal Foot: Topical Lidocaine 4% cream applied to wound bed prior to debridement - for clinic use only Wound #3 Left Calcaneus: Topical Lidocaine 4% cream applied to wound bed prior to debridement - for clinic use only Wound #4 Right Calcaneus: Topical Lidocaine 4% cream applied to wound bed prior to debridement - for clinic use only [name], Connie R. ([id number]) Wound #6 Right,Lateral,Posterior Lower Leg: Topical Lidocaine 4% cream applied to wound bed prior to debridement - for clinic use only Wound #7 Right,Lateral Malleolus: Topical Lidocaine 4% cream applied to wound bed prior to debridement - for clinic use only Skin Barriers/Peri-Wound Care: Wound #6 Right,Lateral,Posterior Lower Leg: Skin Prep Primary Wound Dressing: Wound #1 Left,Dorsal Foot: Iodoflex Wound #2 Right,Dorsal Foot: Iodoflex Wound #3 Left Calcaneus: Medihoney gel Wound #4 Right Calcaneus: Medihoney gel Wound #6 Right,Lateral,Posterior Lower Leg: Prisma Ag Wound #7 Right,Lateral Malleolus: Prisma Ag Secondary Dressing: Wound #1 Left,Dorsal Foot: ABD pad Dry Gauze Drawtex Wound #2 Right,Dorsal Foot: ABD pad Dry Gauze Drawtex Wound #3 Left Calcaneus: ABD pad Dry Gauze Other - heel cup Wound #4 Right Calcaneus: ABD pad Dry Gauze Other - heel cup Wound #6 Right,Lateral,Posterior Lower Leg: Dry Gauze Boardered Foam Dressing Drawtex Wound #7 Right,Lateral Malleolus: ABD pad Dry Gauze Dressing Change Frequency: Wound #1 Left,Dorsal Foot: Three times weekly - pt will have this changed on Tuesdays in the [facility] Wound #2 Right,Dorsal  Foot: Three times weekly - pt will have this changed on Tuesdays in the [facility] Wound #3 Left Calcaneus: Three times weekly - pt will have this changed on Tuesdays in the [facility] ([id number]) Wound #4 Right Calcaneus: Three times weekly - pt will have this changed on Tuesdays in the [facility] Wound #6 Right,Lateral,Posterior Lower Leg: Three times weekly - pt will have this changed on Tuesdays in the [facility] Wound #7 Right,Lateral Malleolus: Three times weekly - pt will have this changed on Tuesdays in the [facility] Follow-up Appointments: Wound #1 Left,Dorsal Foot: Return Appointment in 1 week. Wound #2 Right,Dorsal Foot: Return Appointment in 1 week. Wound #3 Left Calcaneus: Return Appointment in 1 week. Wound #4 Right Calcaneus: Return Appointment in 1 week. Wound #6 Right,Lateral,Posterior Lower Leg: Return Appointment in 1 week. Edema Control: Wound #1 Left,Dorsal Foot: 3 Layer  Compression System - Bilateral Elevate legs to the level of the heart and pump ankles as often as possible Wound #2 Right,Dorsal Foot: 3 Layer Compression System - Bilateral Elevate legs to the level of the heart and pump ankles as often as possible Wound #3 Left Calcaneus: 3 Layer Compression System - Bilateral Elevate legs to the level of the heart and pump ankles as often as possible Wound #4 Right Calcaneus: 3 Layer Compression System - Bilateral Elevate legs to the level of the heart and pump ankles as often as possible Wound #6 Right,Lateral,Posterior Lower Leg: 3 Layer Compression System - Bilateral Elevate legs to the level of the heart and pump ankles as often as possible Wound #7 Right,Lateral Malleolus: 3 Layer Compression System - Bilateral Elevate legs to the level of the heart and pump ankles as often as possible Off-Loading: Wound #1 Left,Dorsal Foot: Turn and reposition every 2 hours Other: - float heels above heart  level while in the bed Wound #2 Right,Dorsal Foot: Turn and reposition every 2 hours Other: - float heels above heart level while in the bed Wound #3 Left Calcaneus: Turn and reposition every 2 hours Other: - float heels above heart level while in the bed Wound #4 Right Calcaneus: Turn and reposition every 2 hours Other: - float heels above heart level while in the bed Wound #6 Right,Lateral,Posterior Lower Leg: Turn and reposition every 2 hours [name], Connie R. ([id number]) Other: - float heels above heart level while in the bed Wound #7 Right,Lateral Malleolus: Turn and reposition every 2 hours Other: - float heels above heart level while in the bed Additional Orders / Instructions: Wound #1 Left,Dorsal Foot: Increase protein intake. Wound #2 Right,Dorsal Foot: Increase protein intake. Wound #3 Left Calcaneus: Increase protein intake. Wound #4 Right Calcaneus: Increase protein intake. Wound #6 Right,Lateral,Posterior Lower Leg: Increase protein intake. Wound #7 Right,Lateral Malleolus: Increase protein intake. Home Health: Wound #1 Left,Dorsal Foot: [place] Nurse may visit PRN to address patient s wound care needs. FACE TO FACE ENCOUNTER: MEDICARE and MEDICAID PATIENTS: I certify that this patient is under my care and that I had a face-to-face encounter that meets the physician face-to-face encounter requirements with this patient on this date. The encounter with the patient was in whole or in part for the following MEDICAL CONDITION: (primary reason for [place]) MEDICAL NECESSITY: I certify, that based on my findings, NURSING services are a medically necessary home health service. HOME BOUND STATUS: I certify that my clinical findings support that this patient is homebound (i.e., Due to illness or injury, pt requires aid of supportive devices such as crutches, cane, wheelchairs, walkers, the use of special transportation or the  assistance of another person to leave their place of residence. There is a normal inability to leave the home and doing so requires considerable and taxing effort. Other absences are for medical reasons / religious services and are infrequent or of short duration when for other reasons). If current dressing causes regression in wound condition, may D/C ordered dressing product/s and apply Normal Saline Moist Dressing daily until next [place] / Other MD appointment. [place] of regression in wound condition at [phone]. Please direct any NON-WOUND related issues/requests for orders to patient's Primary Care Physician Wound #2 Right,Dorsal Foot: [place] Nurse may visit PRN to address patient s wound care needs. FACE TO FACE ENCOUNTER: MEDICARE and MEDICAID PATIENTS: I certify that this patient is  under my care and that I had a face-to-face encounter that meets the physician face-to-face encounter requirements with this patient on this date. The encounter with the patient was in whole or in part for the following MEDICAL CONDITION: (primary reason for [place]) MEDICAL NECESSITY: I certify, that based on my findings, NURSING services are a medically necessary home health service. HOME BOUND STATUS: I certify that my clinical findings support that this patient is homebound (i.e., Due to illness or injury, pt requires aid of supportive devices such as crutches, cane, wheelchairs, walkers, the use of special transportation or the assistance of another person to leave their place of residence. There is a normal inability to leave the home and doing so requires considerable and taxing effort. Other absences are for medical reasons / religious services and are infrequent or of short duration when for other reasons). If current dressing causes regression in wound condition, may D/C ordered dressing product/s and apply Normal Saline  Moist Dressing daily until next [place] / Other MD appointment. [place] of regression in wound condition at [phone]. BRITTNEE, GAETANO ([id number]) Please direct any NON-WOUND related issues/requests for orders to patient's Primary Care Physician Wound #3 Left Calcaneus: [place] Nurse may visit PRN to address patient s wound care needs. FACE TO FACE ENCOUNTER: MEDICARE and MEDICAID PATIENTS: I certify that this patient is under my care and that I had a face-to-face encounter that meets the physician face-to-face encounter requirements with this patient on this date. The encounter with the patient was in whole or in part for the following MEDICAL CONDITION: (primary reason for [place]) MEDICAL NECESSITY: I certify, that based on my findings, NURSING services are a medically necessary home health service. HOME BOUND STATUS: I certify that my clinical findings support that this patient is homebound (i.e., Due to illness or injury, pt requires aid of supportive devices such as crutches, cane, wheelchairs, walkers, the use of special transportation or the assistance of another person to leave their place of residence. There is a normal inability to leave the home and doing so requires considerable and taxing effort. Other absences are for medical reasons / religious services and are infrequent or of short duration when for other reasons). If current dressing causes regression in wound condition, may D/C ordered dressing product/s and apply Normal Saline Moist Dressing daily until next [place] / Other MD appointment. [place] of regression in wound condition at [phone]. Please direct any NON-WOUND related issues/requests for orders to patient's Primary Care Physician Wound #4 Right Calcaneus: [place] Nurse may visit PRN to address patient s wound care  needs. FACE TO FACE ENCOUNTER: MEDICARE and MEDICAID PATIENTS: I certify that this patient is under my care and that I had a face-to-face encounter that meets the physician face-to-face encounter requirements with this patient on this date. The encounter with the patient was in whole or in part for the following MEDICAL CONDITION: (primary reason for [place]) MEDICAL NECESSITY: I certify, that based on my findings, NURSING services are a medically necessary home health service. HOME BOUND STATUS: I certify that my clinical findings support that this patient is homebound (i.e., Due to illness or injury, pt requires aid of supportive devices such as crutches, cane, wheelchairs, walkers, the use of special transportation or the assistance of another person to leave their place of residence. There is a normal inability to leave the  home and doing so requires considerable and taxing effort. Other absences are for medical reasons / religious services and are infrequent or of short duration when for other reasons). If current dressing causes regression in wound condition, may D/C ordered dressing product/s and apply Normal Saline Moist Dressing daily until next [place] / Other MD appointment. [place] of regression in wound condition at [phone]. Please direct any NON-WOUND related issues/requests for orders to patient's Primary Care Physician Wound #6 Right,Lateral,Posterior Lower Leg: [place] Nurse may visit PRN to address patient s wound care needs. FACE TO FACE ENCOUNTER: MEDICARE and MEDICAID PATIENTS: I certify that this patient is under my care and that I had a face-to-face encounter that meets the physician face-to-face encounter requirements with this patient on this date. The encounter with the patient was in whole or in part for the following MEDICAL CONDITION: (primary reason for [place]) MEDICAL NECESSITY:  I certify, that based on my findings, NURSING services are a medically necessary home health service. HOME BOUND STATUS: I certify that my clinical findings support that this patient is homebound (i.e., Due to illness or injury, pt requires aid of supportive devices such as crutches, cane, wheelchairs, walkers, the use of special transportation or the assistance of another person to leave their place of residence. There is a normal inability to leave the home and doing so requires considerable and taxing effort. Other absences are for medical reasons / religious services and are infrequent or of short duration when for other reasons). If current dressing causes regression in wound condition, may D/C ordered dressing product/s and apply Normal Saline Moist Dressing daily until next [place] / Other MD appointment. [place] of regression in wound condition at [phone]. LAKERIA, STARKMAN ([id number]) Please direct any NON-WOUND related issues/requests for orders to patient's Primary Care Physician Wound #7 Right,Lateral Malleolus: [place] Nurse may visit PRN to address patient s wound care needs. FACE TO FACE ENCOUNTER: MEDICARE and MEDICAID PATIENTS: I certify that this patient is under my care and that I had a face-to-face encounter that meets the physician face-to-face encounter requirements with this patient on this date. The encounter with the patient was in whole or in part for the following MEDICAL CONDITION: (primary reason for [place]) MEDICAL NECESSITY: I certify, that based on my findings, NURSING services are a medically necessary home health service. HOME BOUND STATUS: I certify that my clinical findings support that this patient is homebound (i.e., Due to illness or injury, pt requires aid of supportive devices such as crutches, cane, wheelchairs, walkers, the use of special transportation or the assistance of  another person to leave their place of residence. There is a normal inability to leave the home and doing so requires considerable and taxing effort. Other absences are for medical reasons / religious services and are infrequent or of short duration when for other reasons). If current dressing causes regression in wound condition, may D/C ordered dressing product/s and apply Normal Saline Moist Dressing daily until next [place] / Other MD appointment. [place] of regression in wound condition at [phone]. Please direct any NON-WOUND related issues/requests for orders to patient's Primary Care Physician Medications-please add to medication list.: Wound #1 Left,Dorsal Foot: Other: - Vitamin C, Zinc, MVI Wound #2 Right,Dorsal Foot: Other: - Vitamin C, Zinc, MVI Wound #3 Left Calcaneus: Other: - Vitamin C, Zinc, MVI Wound #4  Right Calcaneus: Other: - Vitamin C, Zinc, MVI Wound #6 Right,Lateral,Posterior Lower Leg: Other: - Vitamin C, Zinc, MVI Wound #7 Right,Lateral Malleolus: Other: - Vitamin C, Zinc, MVI #1 I'm continuing the same dressings to all her wounds #2 I will look at Dr. Bunnie Domino note and if necessary call him. While the amputation does not need to be done "urgently" she has underlying osteomyelitis in the left foot that I have not been treating. She has on dialysis and we could arrange prolonged antibiotics at dialysis although I would need to understand why in a patient is heading towards amputation this would be necessary #3 the wounds on the anterior foot will require sacrificing of necrotic tendon. If we are attempting to salvage the wound on the right foot I can do this next week. #4 an advanced treatment option like Apligraf might be interesting on the right WINRY, EGNEW ([id number]) Electronic Signature(s) Signed: 02/16/2017 8:02:19 AM By: Linton Ham MD Entered By: Linton Ham on 02/16/2017 07:43:30 [name]  ([id number]) -------------------------------------------------------------------------------- SuperBill Details Patient Name: [name] Date of Service: 02/15/2017 Medical Record Patient Account Number: [id number] Number: Treating RN: [name] 1934/01/27 (83 y.o. 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

## 2017-02-16 NOTE — Progress Notes (Addendum)
Connie, Tucker ([id number]) Visit Report for 02/15/2017 Arrival Information Details Patient Name: [name], [name] Date of Service: 02/15/2017 12:30 PM Medical Record Patient Account Number: [id number] Number: Treating RN: Ahmed Prima October 31, 1934 (83 y.o. Other Clinician: Date of Birth/Sex: Female) Treating ROBSON, [place] Primary Care Claude Waldman: Emily Filbert Runette Scifres/Extender: G Referring Aryella Besecker: Melina Modena in Treatment: 6 Visit Information History Since Last Visit All ordered tests and consults were completed: No Patient Arrived: Wheel Chair Added or deleted any medications: No Arrival Time: 12:34 Any new allergies or adverse reactions: No Accompanied By: husband Had a fall or experienced change in No activities of daily living that may affect Transfer Assistance: Harrel Lemon Lift risk of falls: Patient Identification Verified: Yes Signs or symptoms of abuse/neglect since last No Secondary Verification Process Yes visito Completed: Hospitalized since last visit: No Patient Requires Transmission-Based No Has Dressing in Place as Prescribed: Yes Precautions: Has Compression in Place as Prescribed: Yes Patient Has Alerts: Yes Pain Present Now: No Patient Alerts: DM II Electronic Signature(s) Signed: 02/15/2017 3:56:24 PM By: Alric Quan Entered By: Alric Quan on 02/15/2017 12:34:22 [name] ([id number]) -------------------------------------------------------------------------------- Encounter Discharge Information Details Patient Name: [name] Date of Service: 02/15/2017 12:30 PM Medical Record Patient Account Number: [id number] Number: Treating RN: Ahmed Prima October 08, 1934 (83 y.o. Other Clinician: Date of Birth/Sex: Female) Treating ROBSON, MICHAEL Primary Care Jaydence Arnesen: Emily Filbert Jecenia Leamer/Extender: G Referring Patirica Longshore: Melina Modena in Treatment: 6 Encounter Discharge Information Items Discharge Pain Level:  0 Discharge Condition: Stable Ambulatory Status: Wheelchair Discharge Destination: Home Transportation: Private Auto Accompanied By: self Schedule Follow-up Appointment: Yes Medication Reconciliation completed No and provided to Patient/Care Elaria Osias: Provided on Clinical Summary of Care: 02/15/2017 Form Type Recipient Paper Patient MB Electronic Signature(s) Signed: 02/15/2017 1:54:32 PM By: Ruthine Dose Entered By: Ruthine Dose on 02/15/2017 13:54:31 [name] ([id number]) -------------------------------------------------------------------------------- Lower Extremity Assessment Details Patient Name: [name] Date of Service: 02/15/2017 12:30 PM Medical Record Patient Account Number: [id number] Number: Treating RN: Ahmed Prima Jul 02, 1934 (83 y.o. Other Clinician: Date of Birth/Sex: Female) Treating ROBSON, [place] Primary Care Dustie Brittle: Emily Filbert Hakeem Frazzini/Extender: G Referring Marcial Pless: Melina Modena in Treatment: 6 Edema Assessment Assessed: [Left: No] [Right: No] E[Left: dema] [Right: :] Calf Left: Right: Point of Measurement: 31 cm From Medial Instep 47.2 cm 48.5 cm Ankle Left: Right: Point of Measurement: 8 cm From Medial Instep 26.5 cm 28.6 cm Vascular Assessment Pulses: Posterior Tibial Extremity colors, hair growth, and conditions: Extremity Color: [Left:Hyperpigmented] [Right:Hyperpigmented] Temperature of Extremity: [Left:Warm] [Right:Warm] Capillary Refill: [Left:< 3 seconds] [Right:< 3 seconds] Electronic Signature(s) Signed: 02/15/2017 3:56:24 PM By: Alric Quan Entered By: Alric Quan on 02/15/2017 14:20:25 [name] ([id number]) -------------------------------------------------------------------------------- Multi Wound Chart Details Patient Name: [name] Date of Service: 02/15/2017 12:30 PM Medical Record Patient Account Number: [id number] Number: Treating RN: Ahmed Prima Sep 30, 1934  (83 y.o. Other Clinician: Date of Birth/Sex: Female) Treating ROBSON, MICHAEL Primary Care Jaramiah Bossard: Emily Filbert Lameshia Hypolite/Extender: G Referring Chloee Tena: Melina Modena in Treatment: 6 Vital Signs Height(in): 61 Pulse(bpm): 87 Weight(lbs): 206 Blood Pressure 96/48 (mmHg): Body Mass Index(BMI): 39 Temperature(F): 97.7 Respiratory Rate 16 (breaths/min): Photos: [1:No Photos] [2:No Photos] [3:No Photos] Wound Location: [1:Left Foot - Dorsal] [2:Right Foot - Dorsal] [3:Left Calcaneus] Wounding Event: [1:Gradually Appeared] [2:Gradually Appeared] [3:Pressure Injury] Primary Etiology: [1:Diabetic Wound/Ulcer of the Lower Extremity] [2:Diabetic Wound/Ulcer of the Lower Extremity] [3:Pressure Ulcer] Comorbid History: [1:Sleep Apnea, Arrhythmia, Hypertension, Cirrhosis , Type II Diabetes, End Stage Renal Disease, Gout,  Osteoarthritis] [2:Sleep Apnea, Arrhythmia, Hypertension, Cirrhosis , Type II Diabetes, End Stage Renal Disease, Gout,  Osteoarthritis] [3:Sleep Apnea, Arrhythmia, Hypertension, Cirrhosis , Type II Diabetes, End Stage Renal Disease, Gout, Osteoarthritis] Date Acquired: [1:11/04/2016] [2:11/04/2016] [3:11/04/2016] Weeks of Treatment: [1:6] [2:6] [3:6] Wound Status: [1:Open] [2:Open] [3:Open] Measurements L x W x D 3.2x5.4x0.6 [2:6.3x3x0.2] [3:3.5x7.8x0.1] (cm) Area (cm) : [1:13.572] [2:14.844] [3:21.441] Volume (cm) : [1:8.143] [2:2.969] [3:2.144] % Reduction in Area: [1:4.00%] [2:21.30%] [3:24.20%] % Reduction in Volume: -188.00% [2:21.20%] [3:24.20%] Starting Position 1 12 (o'clock): Ending Position 1 [1:2] (o'clock): Maximum Distance 1 1.3 (cm): Undermining: [1:Yes] [2:No] [3:No] Classification: [1:Grade 1] [2:Grade 2] [3:Category/Stage II] HBO Classification: [1:N/A] [2:N/A] [3:Grade 1] Exudate Amount: Large Large Large Exudate Type: Serosanguineous Purulent Serous Exudate Color: red, brown yellow, brown, green amber Wound Margin: Well defined, not  attached Distinct, outline attached Distinct, outline attached Granulation Amount: Medium (34-66%) Medium (34-66%) None Present (0%) Granulation Quality: Red, Pink Pink, Hyper-granulation N/A Necrotic Amount: Medium (34-66%) Medium (34-66%) Large (67-100%) Necrotic Tissue: Eschar, Adherent Slough Eschar, Adherent Slough Eschar Exposed Structures: Fat Layer (Subcutaneous Fat Layer (Subcutaneous N/A Tissue) Exposed: Yes Tissue) Exposed: Yes Fascia: No Fascia: No Tendon: No Tendon: No Muscle: No Muscle: No Joint: No Joint: No Bone: No Bone: No Epithelialization: None None None Periwound Skin Texture: Excoriation: Yes No Abnormalities Noted No Abnormalities Noted Induration: Yes Callus: Yes Crepitus: Yes Rash: Yes Scarring: Yes Periwound Skin Maceration: Yes Maceration: Yes No Abnormalities Noted Moisture: Dry/Scaly: Yes Periwound Skin Color: Atrophie Blanche: Yes Erythema: Yes Erythema: Yes Cyanosis: Yes Ecchymosis: Yes Erythema: Yes Hemosiderin Staining: Yes Mottled: Yes Pallor: Yes Rubor: Yes Erythema Location: N/A Circumferential Circumferential Temperature: No Abnormality No Abnormality No Abnormality Tenderness on Yes Yes Yes Palpation: Wound Preparation: Ulcer Cleansing: Other: Ulcer Cleansing: Ulcer Cleansing: soap and water Rinsed/Irrigated with Rinsed/Irrigated with Saline Saline Topical Anesthetic Applied: Other: lidocaine Topical Anesthetic Topical Anesthetic 4% Applied: Other: Applied: Other: LIDOCAINE 4% LIDOCAINE 4% Wound Number: [id number]  Photos: No Photos No Photos No Photos Wound Location: Right Calcaneus Right Lower Leg - Lateral, Right Malleolus - Lateral Posterior Wounding Event: Pressure Injury Gradually Appeared Gradually Appeared Primary Etiology: Pressure Ulcer Merlino, Donnis R. ([id number]) 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 the Lower Extremity Comorbid History: Sleep Apnea, Arrhythmia, Sleep Apnea, Arrhythmia,  Sleep Apnea, Arrhythmia, Hypertension, Cirrhosis , Hypertension, Cirrhosis , Hypertension, Cirrhosis , Type II Diabetes, End Type II Diabetes, End Type II Diabetes, End Stage Renal Disease, Stage Renal Disease, Stage Renal Disease, Gout, Osteoarthritis Gout, Osteoarthritis Gout, Osteoarthritis Date Acquired: 11/04/2016 11/04/2016 01/11/2017 Weeks of Treatment: 6 6 5  Wound Status: Open Open Open Measurements L x W x D 3x1x0.4 5x2.2x1.8 1x0.9x0.2 (cm) Area (cm) : 2.356 8.639 0.707 Volume (cm) : 0.942 15.551 0.141 % Reduction in Area: 88.00% 15.40% -40.60% % Reduction in Volume: 76.00% -1.50% 6.60% Undermining: No No No Classification: Category/Stage II Grade 2 Grade 2 HBO Classification: Grade 1 N/A N/A Exudate Amount: Large Large Large Exudate Type: Serosanguineous Serosanguineous Serous Exudate Color: red, brown red, brown amber Wound Margin: Distinct, outline attached Distinct, outline attached Distinct, outline attached Granulation Amount: None Present (0%) Large (67-100%) None Present (0%) Granulation Quality: N/A Red, Pink N/A Necrotic Amount: Large (67-100%) Small (1-33%) Large (67-100%) Necrotic Tissue: Eschar, Adherent [place] Exposed Structures: N/A N/A N/A Epithelialization: None None None Periwound Skin Texture: No Abnormalities Noted No Abnormalities Noted No Abnormalities Noted Periwound Skin No Abnormalities Noted Maceration: Yes No Abnormalities Noted Moisture: Periwound Skin Color: Erythema: Yes  Erythema: Yes No Abnormalities Noted Erythema Location: Circumferential Circumferential N/A Temperature: No Abnormality No Abnormality N/A Tenderness on Yes Yes No Palpation: Wound Preparation: Ulcer Cleansing: Ulcer Cleansing: Ulcer Cleansing: Rinsed/Irrigated with Rinsed/Irrigated with Rinsed/Irrigated with Saline Saline, Other: soap and Saline water Topical Anesthetic Topical Anesthetic Applied: Other: Topical Anesthetic Applied:  Other: lidocaine LIDOCAINE 4% Applied: Other: 4% LIDOCAINE 4% Wound Number: 9 N/A N/A Photos: No Photos N/A N/A Wound Location: N/A N/A REKITA, MIOTKE ([id number]) Left Metatarsal head first - Lateral Wounding Event: Gradually Appeared N/A N/A Primary Etiology: Diabetic Wound/Ulcer of N/A N/A the Lower Extremity Comorbid History: Sleep Apnea, Arrhythmia, N/A N/A Hypertension, Cirrhosis , Type II Diabetes, End Stage Renal Disease, Gout, Osteoarthritis Date Acquired: 02/01/2017 N/A N/A Weeks of Treatment: 2 N/A N/A Wound Status: Open N/A N/A Measurements L x W x D 0.6x0.8x0.1 N/A N/A (cm) Area (cm) : 0.377 N/A N/A Volume (cm) : 0.038 N/A N/A % Reduction in Area: -301.10% N/A N/A % Reduction in Volume: -322.20% N/A N/A Undermining: No N/A N/A Classification: Grade 2 N/A N/A HBO Classification: N/A N/A N/A Exudate Amount: Large N/A N/A Exudate Type: Serosanguineous N/A N/A Exudate Color: red, brown N/A N/A Wound Margin: Distinct, outline attached N/A N/A Granulation Amount: None Present (0%) N/A N/A Granulation Quality: N/A N/A N/A Necrotic Amount: Large (67-100%) N/A N/A Necrotic Tissue: Eschar N/A N/A Exposed Structures: N/A N/A N/A Epithelialization: None N/A N/A Periwound Skin Texture: No Abnormalities Noted N/A N/A Periwound Skin Maceration: Yes N/A N/A Moisture: Periwound Skin Color: No Abnormalities Noted N/A N/A Erythema Location: N/A N/A N/A Temperature: N/A N/A N/A Tenderness on Yes N/A N/A Palpation: Wound Preparation: Ulcer Cleansing: N/A N/A Rinsed/Irrigated with Saline, Other: soap and water Topical Anesthetic Applied: Other: lidocaine 4% Kissner, Lerin R. ([id number]) Treatment Notes Wound #1 (Left, Dorsal Foot) 1. Cleansed with: Clean wound with Normal Saline Cleanse wound with antibacterial soap and water 2. Anesthetic Topical Lidocaine 4% cream to wound bed prior to debridement 4. Dressing Applied: Iodoflex 5. Secondary Dressing  Applied ABD Pad 7. Secured with Tape 3 Layer Compression System - Bilateral Wound #2 (Right, Dorsal Foot) 1. Cleansed with: Clean wound with Normal Saline Cleanse wound with antibacterial soap and water 2. Anesthetic Topical Lidocaine 4% cream to wound bed prior to debridement 4. Dressing Applied: Iodoflex 5. Secondary Dressing Applied ABD Pad 7. Secured with Tape 3 Layer Compression System - Bilateral Wound #3 (Left Calcaneus) 1. Cleansed with: Clean wound with Normal Saline Cleanse wound with antibacterial soap and water 2. Anesthetic Topical Lidocaine 4% cream to wound bed prior to debridement 4. Dressing Applied: Medihoney Gel 5. Secondary Dressing Applied Dry Gauze 7. Secured with Tape 3 Layer Compression System - Bilateral Notes heel cup Wound #4 (Right Calcaneus) Shawhan, Melinda R. ([id number]) 1. Cleansed with: Clean wound with Normal Saline Cleanse wound with antibacterial soap and water 2. Anesthetic Topical Lidocaine 4% cream to wound bed prior to debridement 4. Dressing Applied: Medihoney Gel 5. Secondary Dressing Applied Dry Gauze 7. Secured with Tape 3 Layer Compression System - Bilateral Notes heel cup Wound #6 (Right, Lateral, Posterior Lower Leg) 1. Cleansed with: Clean wound with Normal Saline 2. Anesthetic Topical Lidocaine 4% cream to wound bed prior to debridement 3. Peri-wound Care: Skin Prep 4. Dressing Applied: Prisma Ag 5. Secondary Dressing Applied Bordered Foam Dressing Wound #7 (Right, Lateral Malleolus) 1. Cleansed with: Clean wound with Normal Saline Cleanse wound with antibacterial soap and water 2. Anesthetic Topical Lidocaine 4% cream to wound bed prior to debridement  4. Dressing Applied: Prisma Ag 5. Secondary Dressing Applied Dry Gauze 7. Secured with Tape 3 Layer Compression System - Bilateral Wound #9 (Left, Lateral Metatarsal head first) 1. Cleansed with: Clean wound with Normal Saline Cleanse wound with  antibacterial soap and water 2. Anesthetic Studnicka, Nastacia R. ([id number]) Topical Lidocaine 4% cream to wound bed prior to debridement 4. Dressing Applied: Prisma Ag 5. Secondary Dressing Applied Dry Gauze 7. Secured with Tape 3 Layer Compression System - Bilateral Electronic Signature(s) Signed: 02/16/2017 8:02:19 AM By: Linton Ham MD Previous Signature: 02/15/2017 3:56:24 PM Version By: Alric Quan Entered By: Linton Ham on 02/16/2017 07:35:50 [name] ([id number]) -------------------------------------------------------------------------------- [place] Details Patient Name: [name] Date of Service: 02/15/2017 12:30 PM Medical Record Patient Account Number: [id number] Number: Treating RN: Ahmed Prima 05-30-1934 (83 y.o. Other Clinician: Date of Birth/Sex: Female) Treating ROBSON, [place] Primary Care Izaih Kataoka: Emily Filbert Nahomy [place]/Extender: G Referring Jerimiah Wolman: Melina Modena in Treatment: 6 Active Inactive ` Abuse / Safety / Falls / Self Care Management Nursing Diagnoses: Potential for falls Goals: Patient will remain injury free Date Initiated: 01/04/2017 Target Resolution Date: 02/24/2017 Goal Status: Active Interventions: Assess fall risk on admission and as needed Assess self care needs on admission and as needed Notes: ` Nutrition Nursing Diagnoses: Imbalanced nutrition Impaired glucose control: actual or potential Potential for alteratiion in Nutrition/Potential for imbalanced nutrition Goals: Patient/caregiver agrees to and verbalizes understanding of need to use nutritional supplements and/or vitamins as prescribed Date Initiated: 01/04/2017 Target Resolution Date: 02/24/2017 Goal Status: Active Interventions: Assess patient nutrition upon admission and as needed per policy Notes: AILYNE, PAWLEY ([id number]) Orientation to the Wound Care Program Nursing Diagnoses: Knowledge deficit related  to the wound healing center program Goals: Patient/caregiver will verbalize understanding of the [facility] [place] [facility] Date Initiated: 01/04/2017 Target Resolution Date: 02/24/2017 Goal Status: Active Interventions: Provide education on orientation to the wound center Notes: ` Pain, Acute or Chronic Nursing Diagnoses: Pain, acute or chronic: actual or potential Potential alteration in comfort, pain Goals: Patient will verbalize adequate pain control and receive pain control interventions during procedures as needed Date Initiated: 01/04/2017 Target Resolution Date: 02/24/2017 Goal Status: Active Interventions: Assess comfort goal upon admission Complete pain assessment as per visit requirements Notes: ` Wound/Skin Impairment Nursing Diagnoses: Impaired tissue integrity Goals: Ulcer/skin breakdown will have a volume reduction of 80% by week 12 Date Initiated: 01/04/2017 Target Resolution Date: 02/24/2017 Goal Status: Active Interventions: Assess patient/caregiver ability to perform ulcer/skin care regimen upon admission and as needed VERDA, MEHTA ([id number]) Notes: Electronic Signature(s) Signed: 02/15/2017 3:56:24 PM By: Alric Quan Entered By: Alric Quan on 02/15/2017 13:08:30 [name] ([id number]) -------------------------------------------------------------------------------- Pain Assessment Details Patient Name: [name] Date of Service: 02/15/2017 12:30 PM Medical Record Patient Account Number: [id number] Number: Treating RN: Ahmed Prima [birth year] (83 y.o. Other Clinician: Date of Birth/Sex: Female) Treating ROBSON, MICHAEL Primary Care Kamera Dubas: Emily Filbert Jakeria Caissie/Extender: G Referring Damara Klunder: Melina Modena in Treatment: 6 Active Problems Location of Pain Severity and Description of Pain Patient Has Paino No Site Locations With Dressing Change: No Pain Management and Medication Current Pain  Management: Electronic Signature(s) Signed: 02/15/2017 3:56:24 PM By: Alric Quan Entered By: Alric Quan on 02/15/2017 12:34:28 [name] ([id number]) -------------------------------------------------------------------------------- Patient/Caregiver Education Details Patient Name: [name] Date of Service: 02/15/2017 12:30 PM Medical Record Patient Account Number: [id number] Number: Treating RN: Ahmed Prima 1934-12-08 (83 y.o. Other Clinician: Date of Birth/Gender: Female) Treating ROBSON,  MICHAEL Primary Care Physician: Emily Filbert Physician/Extender: G Referring Physician: Melina Modena in Treatment: 6 Education Assessment Education Provided To: Patient and Caregiver Education Topics Provided Wound/Skin Impairment: Handouts: Other: change dressing as ordered and do not get dressings wet Methods: Demonstration, Explain/Verbal Responses: State content correctly Electronic Signature(s) Signed: 02/15/2017 3:56:24 PM By: Alric Quan Entered By: Alric Quan on 02/15/2017 13:08:23 [name] ([id number]) -------------------------------------------------------------------------------- Wound Assessment Details Patient Name: [name] Date of Service: 02/15/2017 12:30 PM Medical Record Patient Account Number: [id number] Number: Treating RN: Ahmed Prima [birth year] (83 y.o. Other Clinician: Date of Birth/Sex: Female) Treating ROBSON, [place] Primary Care Omkar Stratmann: Emily Filbert Momo Braun/Extender: G Referring Henery Betzold: Melina Modena in Treatment: 6 Wound Status Wound Number: 1 Primary Diabetic Wound/Ulcer of the Lower Etiology: Extremity Wound Location: Left Foot - Dorsal Wound Open Wounding Event: Gradually Appeared Status: Date Acquired: 11/04/2016 Comorbid Sleep Apnea, Arrhythmia, Hypertension, Weeks Of Treatment: 6 History: Cirrhosis , Type II Diabetes, End Stage Clustered Wound: No Renal Disease,  Gout, Osteoarthritis Photos Photo Uploaded By: Alric Quan on 02/16/2017 07:43:26 Wound Measurements Length: (cm) 3.2 % Reduction in Width: (cm) 5.4 % Reduction in Depth: (cm) 0.6 Epithelializati Area: (cm) 13.572 Tunneling: Volume: (cm) 8.143 Undermining: Starting Pos Ending Posit Maximum Dist Area: 4% Volume: -188% on: None No Yes ition (o'clock): 12 ion (o'clock): 2 ance: (cm) 1.3 Wound Description Classification: Grade 1 Foul Odor Afte Wound Margin: Well defined, not attached Slough/Fibrino Exudate Amount: Large Exudate Type: Serosanguineous Exudate Color: red, brown r Cleansing: No Yes Wound Bed Favila, Blanche R. ([id number]) Granulation Amount: Medium (34-66%) Exposed Structure Granulation Quality: Red, Pink Fascia Exposed: No Necrotic Amount: Medium (34-66%) Fat Layer (Subcutaneous Tissue) Exposed: Yes Necrotic Quality: Eschar, Adherent Slough Tendon Exposed: No Muscle Exposed: No Joint Exposed: No Bone Exposed: No Periwound Skin Texture Texture Color No Abnormalities Noted: No No Abnormalities Noted: No Callus: Yes Atrophie Blanche: Yes Crepitus: Yes Cyanosis: Yes Excoriation: Yes Ecchymosis: Yes Induration: Yes Erythema: Yes Rash: Yes Hemosiderin Staining: Yes Scarring: Yes Mottled: Yes Pallor: Yes Moisture Rubor: Yes No Abnormalities Noted: No Dry / Scaly: Yes Temperature / Pain Maceration: Yes Temperature: No Abnormality Tenderness on Palpation: Yes Wound Preparation Ulcer Cleansing: Other: soap and water, Topical Anesthetic Applied: Other: lidocaine 4%, Treatment Notes Wound #1 (Left, Dorsal Foot) 1. Cleansed with: Clean wound with Normal Saline Cleanse wound with antibacterial soap and water 2. Anesthetic Topical Lidocaine 4% cream to wound bed prior to debridement 4. Dressing Applied: Iodoflex 5. Secondary Dressing Applied ABD Pad 7. Secured with Tape 3 Layer Compression System - Bilateral Electronic  Signature(s) Signed: 02/15/2017 3:56:24 PM By: Alric Quan Entered By: Alric Quan on 02/15/2017 12:50:22 [name] ([id number]) JOHNNAE, IMPASTATO ([id number]) -------------------------------------------------------------------------------- Wound Assessment Details Patient Name: [name] Date of Service: 02/15/2017 12:30 PM Medical Record Patient Account Number: [id number] Number: Treating RN: Ahmed Prima 01/04/1934 (83 y.o. Other Clinician: Date of Birth/Sex: Female) Treating ROBSON, MICHAEL Primary Care Jarrell Armond: Emily Filbert Creed Kail/Extender: G Referring Kebra Lowrimore: Melina Modena in Treatment: 6 Wound Status Wound Number: 2 Primary Diabetic Wound/Ulcer of the Lower Etiology: Extremity Wound Location: Right Foot - Dorsal Wound Open Wounding Event: Gradually Appeared Status: Date Acquired: 11/04/2016 Comorbid Sleep Apnea, Arrhythmia, Hypertension, Weeks Of Treatment: 6 History: Cirrhosis , Type II Diabetes, End Stage Clustered Wound: No Renal Disease, Gout, Osteoarthritis Photos Photo Uploaded By: Alric Quan on 02/16/2017 07:43:26 Wound Measurements Length: (cm) 6.3 Width: (cm) 3 Depth: (cm) 0.2 Area: (cm) 14.844 Volume: (cm) 2.969 %  Reduction in Area: 21.3% % Reduction in Volume: 21.2% Epithelialization: None Tunneling: No Undermining: No Wound Description Classification: Grade 2 Foul Odor Aft Wound Margin: Distinct, outline attached Slough/Fibrin Exudate Amount: Large Exudate Type: Purulent Exudate Color: yellow, brown, green er Cleansing: No o Yes Wound Bed Granulation Amount: Medium (34-66%) Exposed Structure Granulation Quality: Pink, Hyper-granulation Fascia Exposed: No Necrotic Amount: Medium (34-66%) Fat Layer (Subcutaneous Tissue) Exposed: Yes Necrotic Quality: Eschar, Adherent Slough Tendon Exposed: No Quale, Peyten R. ([id number]) Muscle Exposed: No Joint Exposed: No Bone Exposed: No Periwound Skin  Texture Texture Color No Abnormalities Noted: No No Abnormalities Noted: No Erythema: Yes Moisture Erythema Location: Circumferential No Abnormalities Noted: No Maceration: Yes Temperature / Pain Temperature: No Abnormality Tenderness on Palpation: Yes Wound Preparation Ulcer Cleansing: Rinsed/Irrigated with Saline Topical Anesthetic Applied: Other: LIDOCAINE 4%, Treatment Notes Wound #2 (Right, Dorsal Foot) 1. Cleansed with: Clean wound with Normal Saline Cleanse wound with antibacterial soap and water 2. Anesthetic Topical Lidocaine 4% cream to wound bed prior to debridement 4. Dressing Applied: Iodoflex 5. Secondary Dressing Applied ABD Pad 7. Secured with Tape 3 Layer Compression System - Bilateral Electronic Signature(s) Signed: 02/15/2017 3:56:24 PM By: Alric Quan Entered By: Alric Quan on 02/15/2017 12:52:22 EYLEEN, RAWLINSON ([id number]) -------------------------------------------------------------------------------- Wound Assessment Details Patient Name: [name] Date of Service: 02/15/2017 12:30 PM Medical Record Patient Account Number: [id number] Number: Treating RN: Ahmed Prima 1934-04-05 (83 y.o. Other Clinician: Date of Birth/Sex: Female) Treating ROBSON, [place] Primary Care Maudy Yonan: Emily Filbert Jakyren Fluegge/Extender: G Referring Shoji Pertuit: Melina Modena in Treatment: 6 Wound Status Wound Number: 3 Primary Pressure Ulcer Etiology: Wound Location: Left Calcaneus Wound Open Wounding Event: Pressure Injury Status: Date Acquired: 11/04/2016 Comorbid Sleep Apnea, Arrhythmia, Hypertension, Weeks Of Treatment: 6 History: Cirrhosis , Type II Diabetes, End Stage Clustered Wound: No Renal Disease, Gout, Osteoarthritis Photos Photo Uploaded By: Alric Quan on 02/16/2017 07:43:47 Wound Measurements Length: (cm) 3.5 Width: (cm) 7.8 Depth: (cm) 0.1 Area: (cm) 21.441 Volume: (cm) 2.144 % Reduction in Area: 24.2% %  Reduction in Volume: 24.2% Epithelialization: None Tunneling: No Undermining: No Wound Description Classification: Category/Stage II Foul O Diabetic Severity (Wagner): Grade 1 Slough Wound Margin: Distinct, outline attached Exudate Amount: Large Exudate Type: Serous Exudate Color: amber dor After Cleansing: No /Fibrino No Wound Bed Granulation Amount: None Present (0%) Necrotic Amount: Large (67-100%) Necrotic Quality: REBBECCA, OSUNA R. ([id number]) Periwound Skin Texture Texture Color No Abnormalities Noted: No No Abnormalities Noted: No Erythema: Yes Moisture Erythema Location: Circumferential No Abnormalities Noted: No Temperature / Pain Temperature: No Abnormality Tenderness on Palpation: Yes Wound Preparation Ulcer Cleansing: Rinsed/Irrigated with Saline Topical Anesthetic Applied: Other: LIDOCAINE 4%, Treatment Notes Wound #3 (Left Calcaneus) 1. Cleansed with: Clean wound with Normal Saline Cleanse wound with antibacterial soap and water 2. Anesthetic Topical Lidocaine 4% cream to wound bed prior to debridement 4. Dressing Applied: Medihoney Gel 5. Secondary Dressing Applied Dry Gauze 7. Secured with Tape 3 Layer Compression System - Bilateral Notes heel cup Electronic Signature(s) Signed: 02/15/2017 3:56:24 PM By: Alric Quan Entered By: Alric Quan on 02/15/2017 12:52:49 [name] ([id number]) -------------------------------------------------------------------------------- Wound Assessment Details Patient Name: [name] Date of Service: 02/15/2017 12:30 PM Medical Record Patient Account Number: [id number] Number: Treating RN: Ahmed Prima 1934-01-14 (83 y.o. Other Clinician: Date of Birth/Sex: Female) Treating ROBSON, MICHAEL Primary Care Donnice Nielsen: Emily Filbert Rebel [place]/Extender: G Referring Wisdom Rickey: Melina Modena in Treatment: 6 Wound Status Wound Number: 4 Primary Pressure Ulcer Etiology: Wound  Location: Right Calcaneus  Wound Open Wounding Event: Pressure Injury Status: Date Acquired: 11/04/2016 Comorbid Sleep Apnea, Arrhythmia, Hypertension, Weeks Of Treatment: 6 History: Cirrhosis , Type II Diabetes, End Stage Clustered Wound: No Renal Disease, Gout, Osteoarthritis Photos Photo Uploaded By: Alric Quan on 02/16/2017 07:43:47 Wound Measurements Length: (cm) 3 Width: (cm) 1 Depth: (cm) 0.4 Area: (cm) 2.356 Volume: (cm) 0.942 % Reduction in Area: 88% % Reduction in Volume: 76% Epithelialization: None Tunneling: No Undermining: No Wound Description Classification: Category/Stage II Foul O Diabetic Severity (Wagner): Grade 1 Slough Wound Margin: Distinct, outline attached Exudate Amount: Large Exudate Type: Serosanguineous Exudate Color: red, brown dor After Cleansing: No /Fibrino No Wound Bed Granulation Amount: None Present (0%) Necrotic Amount: Large (67-100%) Necrotic Quality: Eschar, Adherent [address] [place] R. ([id number]) Periwound Skin Texture Texture Color No Abnormalities Noted: No No Abnormalities Noted: No Erythema: Yes Moisture Erythema Location: Circumferential No Abnormalities Noted: No Temperature / Pain Temperature: No Abnormality Tenderness on Palpation: Yes Wound Preparation Ulcer Cleansing: Rinsed/Irrigated with Saline Topical Anesthetic Applied: Other: LIDOCAINE 4%, Treatment Notes Wound #4 (Right Calcaneus) 1. Cleansed with: Clean wound with Normal Saline Cleanse wound with antibacterial soap and water 2. Anesthetic Topical Lidocaine 4% cream to wound bed prior to debridement 4. Dressing Applied: Medihoney Gel 5. Secondary Dressing Applied Dry Gauze 7. Secured with Tape 3 Layer Compression System - Bilateral Notes heel cup Electronic Signature(s) Signed: 02/15/2017 3:56:24 PM By: Alric Quan Entered By: Alric Quan on 02/15/2017 12:53:23 [name]  ([id number]) -------------------------------------------------------------------------------- Wound Assessment Details Patient Name: [name] Date of Service: 02/15/2017 12:30 PM Medical Record Patient Account Number: [id number] Number: Treating RN: Ahmed Prima [birth year] (83 y.o. Other Clinician: Date of Birth/Sex: Female) Treating ROBSON, [place] Primary Care Dimitry Holsworth: Emily Filbert Courtnie Brenes/Extender: G Referring Madisin Hasan: Melina Modena in Treatment: 6 Wound Status Wound Number: 6 Primary Diabetic Wound/Ulcer of the Lower Etiology: Extremity Wound Location: Right Lower Leg - Lateral, Posterior Wound Open Status: Wounding Event: Gradually Appeared Comorbid Sleep Apnea, Arrhythmia, Hypertension, Date Acquired: 11/04/2016 History: Cirrhosis , Type II Diabetes, End Stage Weeks Of Treatment: 6 Renal Disease, Gout, Osteoarthritis Clustered Wound: No Photos Photo Uploaded By: Alric Quan on 02/16/2017 07:44:10 Wound Measurements Length: (cm) 5 Width: (cm) 2.2 Depth: (cm) 1.8 Area: (cm) 8.639 Volume: (cm) 15.551 % Reduction in Area: 15.4% % Reduction in Volume: -1.5% Epithelialization: None Tunneling: No Undermining: No Wound Description Classification: Grade 2 Foul Odor Aft Wound Margin: Distinct, outline attached Slough/Fibrin Exudate Amount: Large Exudate Type: Serosanguineous Exudate Color: red, brown er Cleansing: No o No Wound Bed Granulation Amount: Large (67-100%) Granulation Quality: Red, Pink Necrotic Amount: Small (1-33%) Necrotic Quality: Adherent [place], Aneita R. ([id number]) Periwound Skin Texture Texture Color No Abnormalities Noted: No No Abnormalities Noted: No Erythema: Yes Moisture Erythema Location: Circumferential No Abnormalities Noted: No Maceration: Yes Temperature / Pain Temperature: No Abnormality Tenderness on Palpation: Yes Wound Preparation Ulcer Cleansing: Rinsed/Irrigated with Saline, Other:  soap and water, Topical Anesthetic Applied: Other: LIDOCAINE 4%, Treatment Notes Wound #6 (Right, Lateral, Posterior Lower Leg) 1. Cleansed with: Clean wound with Normal Saline 2. Anesthetic Topical Lidocaine 4% cream to wound bed prior to debridement 3. Peri-wound Care: Skin Prep 4. Dressing Applied: Prisma Ag 5. Secondary Dressing Applied Bordered Foam Dressing Electronic Signature(s) Signed: 02/15/2017 3:56:24 PM By: Alric Quan Entered By: Alric Quan on 02/15/2017 12:54:34 [name] ([id number]) -------------------------------------------------------------------------------- Wound Assessment Details Patient Name: [name] Date of Service: 02/15/2017 12:30 PM Medical Record Patient Account Number: [id number] Number: Treating RN:  Carolyne Fiscal, Debi [birth year] (83 y.o. Other Clinician: Date of Birth/Sex: Female) Treating ROBSON, [place] Primary Care Laurence Crofford: Emily Filbert Louie Meaders/Extender: G Referring Faizan Geraci: Melina Modena in Treatment: 6 Wound Status Wound Number: 7 Primary Diabetic Wound/Ulcer of the Lower Etiology: Extremity Wound Location: Right Malleolus - Lateral Wound Open Wounding Event: Gradually Appeared Status: Date Acquired: 01/11/2017 Comorbid Sleep Apnea, Arrhythmia, Hypertension, Weeks Of Treatment: 5 History: Cirrhosis , Type II Diabetes, End Stage Clustered Wound: No Renal Disease, Gout, Osteoarthritis Photos Photo Uploaded By: Alric Quan on 02/16/2017 07:44:10 Wound Measurements Length: (cm) 1 Width: (cm) 0.9 Depth: (cm) 0.2 Area: (cm) 0.707 Volume: (cm) 0.141 % Reduction in Area: -40.6% % Reduction in Volume: 6.6% Epithelialization: None Tunneling: No Undermining: No Wound Description Classification: Grade 2 Foul Odor Afte Wound Margin: Distinct, outline attached Slough/Fibrino Exudate Amount: Large Exudate Type: Serous Exudate Color: amber r Cleansing: No No Wound Bed Granulation Amount:  None Present (0%) Necrotic Amount: Large (67-100%) Necrotic Quality: Adherent [address] Texture [place], [place] R. ([id number]) Texture Color No Abnormalities Noted: No No Abnormalities Noted: No Moisture No Abnormalities Noted: No Wound Preparation Ulcer Cleansing: Rinsed/Irrigated with Saline Topical Anesthetic Applied: Other: lidocaine 4%, Treatment Notes Wound #7 (Right, Lateral Malleolus) 1. Cleansed with: Clean wound with Normal Saline Cleanse wound with antibacterial soap and water 2. Anesthetic Topical Lidocaine 4% cream to wound bed prior to debridement 4. Dressing Applied: Prisma Ag 5. Secondary Dressing Applied Dry Gauze 7. Secured with Tape 3 Layer Compression System - Bilateral Electronic Signature(s) Signed: 02/15/2017 3:56:24 PM By: Alric Quan Entered By: Alric Quan on 02/15/2017 12:54:56 [name] ([id number]) -------------------------------------------------------------------------------- Wound Assessment Details Patient Name: [name] Date of Service: 02/15/2017 12:30 PM Medical Record Patient Account Number: [id number] Number: Treating RN: Ahmed Prima 1934-06-15 (83 y.o. Other Clinician: Date of Birth/Sex: Female) Treating ROBSON, Silvana Primary Care [place] Student: Emily Filbert [place]/Extender: G Referring Mackenzi Krogh: Melina Modena in Treatment: 6 Wound Status Wound Number: 9 Primary Diabetic Wound/Ulcer of the Lower Etiology: Extremity Wound Location: Left Metatarsal head first - Lateral Wound Open Status: Wounding Event: Gradually Appeared Comorbid Sleep Apnea, Arrhythmia, Hypertension, Date Acquired: 02/01/2017 History: Cirrhosis , Type II Diabetes, End Stage Weeks Of Treatment: 2 Renal Disease, Gout, Osteoarthritis Clustered Wound: No Photos Photo Uploaded By: Alric Quan on 02/16/2017 07:44:21 Wound Measurements Length: (cm) 0.6 Width: (cm) 0.8 Depth: (cm) 0.1 Area: (cm)  0.377 Volume: (cm) 0.038 % Reduction in Area: -301.1% % Reduction in Volume: -322.2% Epithelialization: None Tunneling: No Undermining: No Wound Description Classification: Grade 2 Foul Odor Afte Wound Margin: Distinct, outline attached Slough/Fibrino Exudate Amount: Large Exudate Type: Serosanguineous Exudate Color: red, brown r Cleansing: No No Wound Bed Granulation Amount: None Present (0%) Necrotic Amount: Large (67-100%) Necrotic Quality: BRIGHID, [place] R. ([id number]) Periwound Skin Texture Texture Color No Abnormalities Noted: No No Abnormalities Noted: No Moisture Temperature / Pain No Abnormalities Noted: No Tenderness on Palpation: Yes Maceration: Yes Wound Preparation Ulcer Cleansing: Rinsed/Irrigated with Saline, Other: soap and water, Topical Anesthetic Applied: Other: lidocaine 4%, Treatment Notes Wound #9 (Left, Lateral Metatarsal head first) 1. Cleansed with: Clean wound with Normal Saline Cleanse wound with antibacterial soap and water 2. Anesthetic Topical Lidocaine 4% cream to wound bed prior to debridement 4. Dressing Applied: Prisma Ag 5. Secondary Dressing Applied Dry Gauze 7. Secured with Tape 3 Layer Compression System - Bilateral Electronic Signature(s) Signed: 02/15/2017 3:56:24 PM By: Alric Quan Entered By: Alric Quan on 02/15/2017 12:55:21 [name] ([id number]) -------------------------------------------------------------------------------- Vitals Details Patient  Name: [name] Date of Service: 02/15/2017 12:30 PM Medical Record Patient Account Number: [id number] Number: Treating RN: Ahmed Prima 22-May-1934 (83 y.o. Other Clinician: Date of Birth/Sex: Female) Treating ROBSON, MICHAEL Primary Care Emmalene Kattner: Emily Filbert Maxson Oddo/Extender: G Referring Brinton Brandel: Melina Modena in Treatment: 6 Vital Signs Time Taken: 12:35 Temperature (F): 97.7 Height (in): 61 Pulse (bpm): 87 Weight  (lbs): 206 Respiratory Rate (breaths/min): 16 Body Mass Index (BMI): 38.9 Blood Pressure (mmHg): 96/48 Reference Range: 80 - 120 mg / dl Electronic Signature(s) Signed: 02/15/2017 3:56:24 PM By: Alric Quan Entered By: Alric Quan on 02/15/2017 12:35:47

## 2017-02-22 ENCOUNTER — Encounter: Payer: Medicare Other | Admitting: Internal Medicine

## 2017-02-22 DIAGNOSIS — E11621 Type 2 diabetes mellitus with foot ulcer: Secondary | ICD-10-CM | POA: Diagnosis not present

## 2017-02-23 ENCOUNTER — Telehealth (INDEPENDENT_AMBULATORY_CARE_PROVIDER_SITE_OTHER): Payer: Self-pay | Admitting: Vascular Surgery

## 2017-02-23 ENCOUNTER — Other Ambulatory Visit
Admission: RE | Admit: 2017-02-23 | Discharge: 2017-02-23 | Disposition: A | Discharge status: Home / Self Care | Payer: Medicare Other | Source: Other Acute Inpatient Hospital | Attending: Internal Medicine | Admitting: Internal Medicine

## 2017-02-23 DIAGNOSIS — M216X9 Other acquired deformities of unspecified foot: Secondary | ICD-10-CM | POA: Insufficient documentation

## 2017-02-23 NOTE — Telephone Encounter (Signed)
Went to Dr. Dellia Nims and he said that he spoke to [facility] about scheduling procedure. Said they have been waiting on a call to see when it could be scheduled.

## 2017-02-23 NOTE — Progress Notes (Signed)
FLORICE, HINDLE ([id number]) Visit Report for 02/22/2017 Arrival Information Details Patient Name: [name], [name] Date of Service: 02/22/2017 3:30 PM Medical Record Patient Account Number: [id number] Number: Treating RN: Ahmed Prima 08/21/1934 (83 y.o. Other Clinician: Date of Birth/Sex: Female) Treating ROBSON, [place] Primary Care Joshoa Shawler: Emily Filbert Reyan Helle/Extender: G Referring Dashae Wilcher: Melina Modena in Treatment: 7 Visit Information History Since Last Visit All ordered tests and consults were completed: No Patient Arrived: Wheel Chair Added or deleted any medications: No Arrival Time: 15:39 Any new allergies or adverse reactions: No Accompanied By: husband Had a fall or experienced change in No activities of daily living that may affect Transfer Assistance: Harrel Lemon Lift risk of falls: Patient Identification Verified: Yes Signs or symptoms of abuse/neglect since last No Secondary Verification Process Yes visito Completed: Hospitalized since last visit: No Patient Requires Transmission-Based No Has Dressing in Place as Prescribed: Yes Precautions: Has Compression in Place as Prescribed: Yes Patient Has Alerts: Yes Pain Present Now: No Patient Alerts: DM II Electronic Signature(s) Signed: 02/22/2017 5:20:09 PM By: Alric Quan Entered By: Alric Quan on 02/22/2017 15:40:22 [name] ([id number]) -------------------------------------------------------------------------------- Encounter Discharge Information Details Patient Name: [name] Date of Service: 02/22/2017 3:30 PM Medical Record Patient Account Number: [id number] Number: Treating RN: Ahmed Prima 1934/01/01 (83 y.o. Other Clinician: Date of Birth/Sex: Female) Treating ROBSON, MICHAEL Primary Care Antrone Walla: Emily Filbert Langley Ingalls/Extender: G Referring Wilfrido Luedke: Melina Modena in Treatment: 7 Encounter Discharge Information Items Discharge Pain Level:  0 Discharge Condition: Stable Ambulatory Status: Wheelchair Discharge Destination: Home Transportation: Other Accompanied By: husband Schedule Follow-up Appointment: Yes Medication Reconciliation completed and provided to Patient/Care No Samariah Hokenson: Provided on Clinical Summary of Care: 02/22/2017 Form Type Recipient Paper Patient MB Electronic Signature(s) Signed: 02/22/2017 5:20:09 PM By: Alric Quan Previous Signature: 02/22/2017 4:46:35 PM Version By: Ruthine Dose Entered By: Alric Quan on 02/22/2017 16:48:41 [name] ([id number]) -------------------------------------------------------------------------------- Lower Extremity Assessment Details Patient Name: [name] Date of Service: 02/22/2017 3:30 PM Medical Record Patient Account Number: [id number] Number: Treating RN: Ahmed Prima 04-17-1934 (83 y.o. Other Clinician: Date of Birth/Sex: Female) Treating ROBSON, MICHAEL Primary Care Amiah Frohlich: Emily Filbert Deveron Shamoon/Extender: G Referring Grier Czerwinski: Melina Modena in Treatment: 7 Edema Assessment Assessed: [Left: No] [Right: No] E[Left: dema] [Right: :] Calf Left: Right: Point of Measurement: 31 cm From Medial Instep 47.2 cm 48.5 cm Ankle Left: Right: Point of Measurement: 8 cm From Medial Instep 26.5 cm 28.6 cm Vascular Assessment Pulses: Posterior Tibial Extremity colors, hair growth, and conditions: Extremity Color: [Left:Hyperpigmented] [Right:Hyperpigmented] Temperature of Extremity: [Left:Warm] [Right:Warm] Capillary Refill: [Left:< 3 seconds] [Right:< 3 seconds] Toe Nail Assessment Left: Right: Thick: No No Discolored: No No Deformed: No Improper Length and Hygiene: Yes Yes Electronic Signature(s) Signed: 02/22/2017 5:20:09 PM By: Alric Quan Entered By: Alric Quan on 02/22/2017 16:10:31 [name] ([id number]) -------------------------------------------------------------------------------- Multi Wound  Chart Details Patient Name: [name] Date of Service: 02/22/2017 3:30 PM Medical Record Patient Account Number: [id number] Number: Treating RN: Ahmed Prima 1934-05-05 (83 y.o. Other Clinician: Date of Birth/Sex: Female) Treating ROBSON, MICHAEL Primary Care Gunter Conde: Emily Filbert Yassmin Binegar/Extender: G Referring Jayleena Stille: Melina Modena in Treatment: 7 Vital Signs Height(in): 61 Pulse(bpm): 105 Weight(lbs): 206 Blood Pressure 86/51 (mmHg): Body Mass Index(BMI): 39 Temperature(F): 97.5 Respiratory Rate 16 (breaths/min): Photos: [1:No Photos] [2:No Photos] [3:No Photos] Wound Location: [1:Left Foot - Dorsal] [2:Right, Dorsal Foot] [3:Left Calcaneus] Wounding Event: [1:Gradually Appeared] [2:Gradually Appeared] [3:Pressure Injury] Primary Etiology: [1:Diabetic Wound/Ulcer of the  Lower Extremity] [2:Diabetic Wound/Ulcer of the Lower Extremity] [3:Pressure Ulcer] Comorbid History: [1:Sleep Apnea, Arrhythmia, Hypertension, Cirrhosis , Type II Diabetes, End Stage Renal Disease, Gout, Osteoarthritis] [2:Sleep Apnea, Arrhythmia, Hypertension, Cirrhosis , Type II Diabetes, End Stage Renal Disease, Gout,  Osteoarthritis] [3:Sleep Apnea, Arrhythmia, Hypertension, Cirrhosis , Type II Diabetes, End Stage Renal Disease, Gout, Osteoarthritis] Date Acquired: [1:11/04/2016] [2:11/04/2016] [3:11/04/2016] Weeks of Treatment: [1:7] [2:7] [3:7] Wound Status: [1:Open] [2:Open] [3:Open] Measurements L x W x D 3.4x4.8x0.8 [2:5.5x3x0.2] [3:3.5x7.8x0.1] (cm) Area (cm) : [1:12.818] [2:12.959] [3:21.441] Volume (cm) : [1:10.254] [2:2.592] [3:2.144] % Reduction in Area: [1:9.30%] [2:31.30%] [3:24.20%] % Reduction in Volume: -262.70% [2:31.20%] [3:24.20%] Starting Position 1 12 (o'clock): Ending Position 1 (o'clock): Maximum Distance 1 1 (cm): Undermining: [1:Yes] [2:No] [3:No] Classification: [1:Grade 1] [2:Grade 2] [3:Category/Stage II] HBO Classification: [1:N/A] [2:N/A]  [3:Grade 1] Exudate Amount: Large Large Large Exudate Type: Serosanguineous Serosanguineous Serous Exudate Color: red, brown red, brown amber Wound Margin: Well defined, not attached Distinct, outline attached Distinct, outline attached Granulation Amount: Medium (34-66%) Medium (34-66%) None Present (0%) Granulation Quality: Red, Pink Pink, Hyper-granulation N/A Necrotic Amount: Medium (34-66%) Medium (34-66%) Large (67-100%) Necrotic Tissue: Eschar, Adherent Slough Eschar, Adherent Slough Eschar Exposed Structures: Fat Layer (Subcutaneous Fat Layer (Subcutaneous N/A Tissue) Exposed: Yes Tissue) Exposed: Yes Fascia: No Fascia: No Tendon: No Tendon: No Muscle: No Muscle: No Joint: No Joint: No Bone: No Bone: No Epithelialization: None None None Periwound Skin Texture: Excoriation: Yes No Abnormalities Noted No Abnormalities Noted Induration: Yes Callus: Yes Crepitus: Yes Rash: Yes Scarring: Yes Periwound Skin Maceration: Yes Maceration: Yes No Abnormalities Noted Moisture: Dry/Scaly: Yes Periwound Skin Color: Atrophie Blanche: Yes Erythema: Yes Erythema: Yes Cyanosis: Yes Ecchymosis: Yes Erythema: Yes Hemosiderin Staining: Yes Mottled: Yes Pallor: Yes Rubor: Yes Erythema Location: N/A Circumferential Circumferential Temperature: No Abnormality No Abnormality No Abnormality Tenderness on Yes Yes Yes Palpation: Wound Preparation: Ulcer Cleansing: Other: Ulcer Cleansing: Ulcer Cleansing: soap and water Rinsed/Irrigated with Rinsed/Irrigated with Saline Saline Topical Anesthetic Applied: Other: lidocaine Topical Anesthetic Topical Anesthetic 4% Applied: Other: Applied: Other: LIDOCAINE 4% LIDOCAINE 4% Wound Number: [id number]  Photos: No Photos No Photos No Photos Wound Location: Right Calcaneus Right Lower Leg - Lateral, Right Malleolus - Lateral Posterior Wounding Event: Pressure Injury Gradually Appeared Gradually Appeared Primary Etiology: Pressure Ulcer Gilkerson,  Ariela Tucker. ([id number]) 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 the Lower Extremity Comorbid History: Sleep Apnea, Arrhythmia, Sleep Apnea, Arrhythmia, Sleep Apnea, Arrhythmia, Hypertension, Cirrhosis , Hypertension, Cirrhosis , Hypertension, Cirrhosis , Type II Diabetes, End Type II Diabetes, End Type II Diabetes, End Stage Renal Disease, Stage Renal Disease, Stage Renal Disease, Gout, Osteoarthritis Gout, Osteoarthritis Gout, Osteoarthritis Date Acquired: 11/04/2016 11/04/2016 01/11/2017 Weeks of Treatment: 7 7 6  Wound Status: Open Open Open Measurements L x W x D 2.5x1x0.4 4.8x2.2x1.8 0.2x0.2x0.2 (cm) Area (cm) : 1.963 8.294 0.031 Volume (cm) : 0.785 14.929 0.006 % Reduction in Area: 90.00% 18.80% 93.80% % Reduction in Volume: 80.00% 2.50% 96.00% Undermining: No No No Classification: Category/Stage II Grade 2 Grade 2 HBO Classification: Grade 1 N/A N/A Exudate Amount: Large Large Large Exudate Type: Serosanguineous Serosanguineous Serous Exudate Color: red, brown red, brown amber Wound Margin: Distinct, outline attached Distinct, outline attached Distinct, outline attached Granulation Amount: None Present (0%) Large (67-100%) None Present (0%) Granulation Quality: N/A Red, Pink N/A Necrotic Amount: Large (67-100%) Small (1-33%) Large (67-100%) Necrotic Tissue: Eschar, Adherent [place] Exposed Structures: N/A N/A N/A Epithelialization: None None None Periwound Skin Texture: No  Abnormalities Noted No Abnormalities Noted No Abnormalities Noted Periwound Skin No Abnormalities Noted Maceration: Yes No Abnormalities Noted Moisture: Periwound Skin Color: Erythema: Yes Erythema: Yes No Abnormalities Noted Erythema Location: Circumferential Circumferential N/A Temperature: No Abnormality No Abnormality N/A Tenderness on Yes Yes No Palpation: Wound Preparation: Ulcer Cleansing: Ulcer Cleansing: Ulcer  Cleansing: Rinsed/Irrigated with Rinsed/Irrigated with Rinsed/Irrigated with Saline Saline, Other: soap and Saline water Topical Anesthetic Topical Anesthetic Applied: Other: Topical Anesthetic Applied: Other: lidocaine LIDOCAINE 4% Applied: Other: 4% LIDOCAINE 4% Wound Number: 9 N/A N/A Photos: No Photos N/A N/A Wound Location: N/A N/A [name], [name] ([id number]) Left Metatarsal head first - Lateral Wounding Event: Gradually Appeared N/A N/A Primary Etiology: Diabetic Wound/Ulcer of N/A N/A the Lower Extremity Comorbid History: Sleep Apnea, Arrhythmia, N/A N/A Hypertension, Cirrhosis , Type II Diabetes, End Stage Renal Disease, Gout, Osteoarthritis Date Acquired: 02/01/2017 N/A N/A Weeks of Treatment: 3 N/A N/A Wound Status: Open N/A N/A Measurements L x W x D 0.6x0.7x0.1 N/A N/A (cm) Area (cm) : 0.33 N/A N/A Volume (cm) : 0.033 N/A N/A % Reduction in Area: -251.10% N/A N/A % Reduction in Volume: -266.70% N/A N/A Undermining: No N/A N/A Classification: Grade 2 N/A N/A HBO Classification: N/A N/A N/A Exudate Amount: Large N/A N/A Exudate Type: Serosanguineous N/A N/A Exudate Color: red, brown N/A N/A Wound Margin: Distinct, outline attached N/A N/A Granulation Amount: None Present (0%) N/A N/A Granulation Quality: N/A N/A N/A Necrotic Amount: Large (67-100%) N/A N/A Necrotic Tissue: Eschar N/A N/A Exposed Structures: N/A N/A N/A Epithelialization: None N/A N/A Periwound Skin Texture: No Abnormalities Noted N/A N/A Periwound Skin Maceration: Yes N/A N/A Moisture: Periwound Skin Color: No Abnormalities Noted N/A N/A Erythema Location: N/A N/A N/A Temperature: N/A N/A N/A Tenderness on Yes N/A N/A Palpation: Wound Preparation: Ulcer Cleansing: N/A N/A Rinsed/Irrigated with Saline, Other: soap and water Topical Anesthetic Applied: Other: lidocaine 4% [name], [name] ([id number]) Treatment Notes Electronic Signature(s) Signed: 02/23/2017 7:54:47 AM By:  Linton Ham MD Entered By: Linton Ham on 02/22/2017 16:58:24 [name] ([id number]) -------------------------------------------------------------------------------- [place] Details Patient Name: [name] Date of Service: 02/22/2017 3:30 PM Medical Record Patient Account Number: [id number] Number: Treating RN: Ahmed Prima Nov 02, 1934 (83 y.o. Other Clinician: Date of Birth/Sex: Female) Treating ROBSON, MICHAEL Primary Care Adams Hinch: Emily Filbert Daianna Vasques/Extender: G Referring Inioluwa Boulay: Melina Modena in Treatment: 7 Active Inactive ` Abuse / Safety / Falls / Self Care Management Nursing Diagnoses: Potential for falls Goals: Patient will remain injury free Date Initiated: 01/04/2017 Target Resolution Date: 02/24/2017 Goal Status: Active Interventions: Assess fall risk on admission and as needed Assess self care needs on admission and as needed Notes: ` Nutrition Nursing Diagnoses: Imbalanced nutrition Impaired glucose control: actual or potential Potential for alteratiion in Nutrition/Potential for imbalanced nutrition Goals: Patient/caregiver agrees to and verbalizes understanding of need to use nutritional supplements and/or vitamins as prescribed Date Initiated: 01/04/2017 Target Resolution Date: 02/24/2017 Goal Status: Active Interventions: Assess patient nutrition upon admission and as needed per policy Notes: [name], [name] ([id number]) Orientation to the Wound Care Program Nursing Diagnoses: Knowledge deficit related to the wound healing center program Goals: Patient/caregiver will verbalize understanding of the [facility] Date Initiated: 01/04/2017 Target Resolution Date: 02/24/2017 Goal Status: Active Interventions: Provide education on orientation to the wound center Notes: ` Pain, Acute or Chronic Nursing Diagnoses: Pain, acute or chronic: actual or potential Potential  alteration in comfort, pain Goals: Patient will verbalize adequate pain control and receive pain  control interventions during procedures as needed Date Initiated: 01/04/2017 Target Resolution Date: 02/24/2017 Goal Status: Active Interventions: Assess comfort goal upon admission Complete pain assessment as per visit requirements Notes: ` Wound/Skin Impairment Nursing Diagnoses: Impaired tissue integrity Goals: Ulcer/skin breakdown will have a volume reduction of 80% by week 12 Date Initiated: 01/04/2017 Target Resolution Date: 02/24/2017 Goal Status: Active Interventions: Assess patient/caregiver ability to perform ulcer/skin care regimen upon admission and as needed [name], [name] ([id number]) Notes: Electronic Signature(s) Signed: 02/22/2017 5:20:09 PM By: Alric Quan Entered By: Alric Quan on 02/22/2017 16:10:36 [name] ([id number]) -------------------------------------------------------------------------------- Pain Assessment Details Patient Name: [name] Date of Service: 02/22/2017 3:30 PM Medical Record Patient Account Number: [id number] Number: Treating RN: Ahmed Prima October 05, 1934 (83 y.o. Other Clinician: Date of Birth/Sex: Female) Treating ROBSON, MICHAEL Primary Care Marcee Jacobs: Emily Filbert Jaycie Kregel/Extender: G Referring Precilla Purnell: Melina Modena in Treatment: 7 Active Problems Location of Pain Severity and Description of Pain Patient Has Paino No Site Locations With Dressing Change: No Pain Management and Medication Current Pain Management: Electronic Signature(s) Signed: 02/22/2017 5:20:09 PM By: Alric Quan Entered By: Alric Quan on 02/22/2017 15:40:30 [name] ([id number]) -------------------------------------------------------------------------------- Patient/Caregiver Education Details Patient Name: [name] Date of Service: 02/22/2017 3:30 PM Medical Record Patient Account Number:  [id number] Number: Treating RN: Ahmed Prima January 01, 1934 (83 y.o. Other Clinician: Date of Birth/Gender: Female) Treating ROBSON, [place] Primary Care Physician: Emily Filbert Physician/Extender: G Referring Physician: Melina Modena in Treatment: 7 Education Assessment Education Provided To: Patient Education Topics Provided Wound/Skin Impairment: Handouts: Other: change dressing as ordered Methods: Demonstration, Explain/Verbal Responses: State content correctly Electronic Signature(s) Signed: 02/22/2017 5:20:09 PM By: Alric Quan Entered By: Alric Quan on 02/22/2017 16:48:55 [name] ([id number]) -------------------------------------------------------------------------------- Wound Assessment Details Patient Name: [name] Date of Service: 02/22/2017 3:30 PM Medical Record Patient Account Number: [id number] Number: Treating RN: Ahmed Prima [birth year] (83 y.o. Other Clinician: Date of Birth/Sex: Female) Treating ROBSON, [place] Primary Care Josian Lanese: Emily Filbert Abayomi Pattison/Extender: G Referring Amaan Meyer: Melina Modena in Treatment: 7 Wound Status Wound Number: 1 Primary Diabetic Wound/Ulcer of the Lower Etiology: Extremity Wound Location: Left Foot - Dorsal Wound Open Wounding Event: Gradually Appeared Status: Date Acquired: 11/04/2016 Comorbid Sleep Apnea, Arrhythmia, Hypertension, Weeks Of Treatment: 7 History: Cirrhosis , Type II Diabetes, End Stage Clustered Wound: No Renal Disease, Gout, Osteoarthritis Photos Photo Uploaded By: Alric Quan on 02/22/2017 17:07:22 Wound Measurements Length: (cm) 3.4 % Reduction in Width: (cm) 4.8 % Reduction in Depth: (cm) 0.8 Epithelializati Area: (cm) 12.818 Tunneling: Volume: (cm) 10.254 Undermining: Starting Pos Maximum Dist Area: 9.3% Volume: -262.7% on: None No Yes ition (o'clock): 12 ance: (cm) 1 Wound Description Classification: Grade 1 Foul  Odor Afte Wound Margin: Well defined, not attached Slough/Fibrino Exudate Amount: Large Exudate Type: Serosanguineous Exudate Color: red, brown Tucker, [name]. ([id number]) Tucker Cleansing: No Yes Wound Bed Granulation Amount: Medium (34-66%) Exposed Structure Granulation Quality: Red, Pink Fascia Exposed: No Necrotic Amount: Medium (34-66%) Fat Layer (Subcutaneous Tissue) Exposed: Yes Necrotic Quality: Eschar, Adherent Slough Tendon Exposed: No Muscle Exposed: No Joint Exposed: No Bone Exposed: No Periwound Skin Texture Texture Color No Abnormalities Noted: No No Abnormalities Noted: No Callus: Yes Atrophie Blanche: Yes Crepitus: Yes Cyanosis: Yes Excoriation: Yes Ecchymosis: Yes Induration: Yes Erythema: Yes Rash: Yes Hemosiderin Staining: Yes Scarring: Yes Mottled: Yes Pallor: Yes Moisture Rubor: Yes No Abnormalities Noted: No Dry / Scaly: Yes Temperature / Pain Maceration: Yes Temperature: No Abnormality Tenderness on Palpation: Yes  Wound Preparation Ulcer Cleansing: Other: soap and water, Topical Anesthetic Applied: Other: lidocaine 4%, Electronic Signature(s) Signed: 02/22/2017 5:20:09 PM By: Alric Quan Entered By: Alric Quan on 02/22/2017 15:59:32 [name] ([id number]) -------------------------------------------------------------------------------- Wound Assessment Details Patient Name: [name] Date of Service: 02/22/2017 3:30 PM Medical Record Patient Account Number: [id number] Number: Treating RN: Ahmed Prima 26-Dec-1933 (83 y.o. Other Clinician: Date of Birth/Sex: Female) Treating ROBSON, [place] Primary Care Isha Seefeld: Emily Filbert Lani Havlik/Extender: G Referring Adriana Lina: Melina Modena in Treatment: 7 Wound Status Wound Number: 2 Primary Diabetic Wound/Ulcer of the Lower Etiology: Extremity Wound Location: Right, Dorsal Foot Wound Open Wounding Event: Gradually Appeared Status: Date Acquired:  11/04/2016 Comorbid Sleep Apnea, Arrhythmia, Hypertension, Weeks Of Treatment: 7 History: Cirrhosis , Type II Diabetes, End Stage Clustered Wound: No Renal Disease, Gout, Osteoarthritis Photos Photo Uploaded By: Alric Quan on 02/22/2017 17:07:22 Wound Measurements Length: (cm) 5.5 Width: (cm) 3 Depth: (cm) 0.2 Area: (cm) 12.959 Volume: (cm) 2.592 % Reduction in Area: 31.3% % Reduction in Volume: 31.2% Epithelialization: None Tunneling: No Undermining: No Wound Description Classification: Grade 2 Foul Odor Aft Wound Margin: Distinct, outline attached Slough/Fibrin Exudate Amount: Large Exudate Type: Serosanguineous Exudate Color: red, brown er Cleansing: No o Yes Wound Bed Granulation Amount: Medium (34-66%) Exposed Structure Granulation Quality: Pink, Hyper-granulation Fascia Exposed: No Tucker, [name]. ([id number]) Necrotic Amount: Medium (34-66%) Fat Layer (Subcutaneous Tissue) Exposed: Yes Necrotic Quality: Eschar, Adherent Slough Tendon Exposed: No Muscle Exposed: No Joint Exposed: No Bone Exposed: No Periwound Skin Texture Texture Color No Abnormalities Noted: No No Abnormalities Noted: No Erythema: Yes Moisture Erythema Location: Circumferential No Abnormalities Noted: No Maceration: Yes Temperature / Pain Temperature: No Abnormality Tenderness on Palpation: Yes Wound Preparation Ulcer Cleansing: Rinsed/Irrigated with Saline Topical Anesthetic Applied: Other: LIDOCAINE 4%, Treatment Notes Wound #2 (Right, Dorsal Foot) 1. Cleansed with: Clean wound with Normal Saline Cleanse wound with antibacterial soap and water 2. Anesthetic Topical Lidocaine 4% cream to wound bed prior to debridement 4. Dressing Applied: Iodoflex 5. Secondary Dressing Applied ABD Pad 7. Secured with Tape 3 Layer Compression System - Bilateral Electronic Signature(s) Signed: 02/22/2017 5:20:09 PM By: Alric Quan Entered By: Alric Quan on 02/22/2017  15:59:43 [name] ([id number]) -------------------------------------------------------------------------------- Wound Assessment Details Patient Name: [name] Date of Service: 02/22/2017 3:30 PM Medical Record Patient Account Number: [id number] Number: Treating RN: Ahmed Prima 23-Jun-1934 (83 y.o. Other Clinician: Date of Birth/Sex: Female) Treating ROBSON, [place] Primary Care Tymere Depuy: Emily Filbert Kern Gingras/Extender: G Referring Evin Loiseau: Melina Modena in Treatment: 7 Wound Status Wound Number: 3 Primary Pressure Ulcer Etiology: Wound Location: Left Calcaneus Wound Open Wounding Event: Pressure Injury Status: Date Acquired: 11/04/2016 Comorbid Sleep Apnea, Arrhythmia, Hypertension, Weeks Of Treatment: 7 History: Cirrhosis , Type II Diabetes, End Stage Clustered Wound: No Renal Disease, Gout, Osteoarthritis Photos Photo Uploaded By: Alric Quan on 02/22/2017 17:07:47 Wound Measurements Length: (cm) 3.5 Width: (cm) 7.8 Depth: (cm) 0.1 Area: (cm) 21.441 Volume: (cm) 2.144 % Reduction in Area: 24.2% % Reduction in Volume: 24.2% Epithelialization: None Tunneling: No Undermining: No Wound Description Classification: Category/Stage II Foul O Diabetic Severity (Wagner): Grade 1 Slough Wound Margin: Distinct, outline attached Exudate Amount: Large Exudate Type: Serous Exudate Color: amber dor After Cleansing: No /Fibrino No Wound Bed Granulation Amount: None Present (0%) Tucker, [name]. ([id number]) Necrotic Amount: Large (67-100%) Necrotic Quality: Eschar Periwound Skin Texture Texture Color No Abnormalities Noted: No No Abnormalities Noted: No Erythema: Yes Moisture Erythema Location: Circumferential No Abnormalities Noted: No Temperature /  Pain Temperature: No Abnormality Tenderness on Palpation: Yes Wound Preparation Ulcer Cleansing: Rinsed/Irrigated with Saline Topical Anesthetic Applied: Other: LIDOCAINE  4%, Treatment Notes Wound #3 (Left Calcaneus) 1. Cleansed with: Clean wound with Normal Saline Cleanse wound with antibacterial soap and water 2. Anesthetic Topical Lidocaine 4% cream to wound bed prior to debridement 4. Dressing Applied: Medihoney Gel 5. Secondary Dressing Applied Dry Gauze 7. Secured with Tape Notes heel cup Electronic Signature(s) Signed: 02/22/2017 5:20:09 PM By: Alric Quan Entered By: Alric Quan on 02/22/2017 16:00:57 [name] ([id number]) -------------------------------------------------------------------------------- Wound Assessment Details Patient Name: [name] Date of Service: 02/22/2017 3:30 PM Medical Record Patient Account Number: [id number] Number: Treating RN: Ahmed Prima 16-Feb-1934 (83 y.o. Other Clinician: Date of Birth/Sex: Female) Treating ROBSON, [place] Primary Care Earland Reish: Emily Filbert Kerrie Latour/Extender: G Referring Khya Halls: Melina Modena in Treatment: 7 Wound Status Wound Number: 4 Primary Pressure Ulcer Etiology: Wound Location: Right Calcaneus Wound Open Wounding Event: Pressure Injury Status: Date Acquired: 11/04/2016 Comorbid Sleep Apnea, Arrhythmia, Hypertension, Weeks Of Treatment: 7 History: Cirrhosis , Type II Diabetes, End Stage Clustered Wound: No Renal Disease, Gout, Osteoarthritis Photos Photo Uploaded By: Alric Quan on 02/22/2017 17:07:48 Wound Measurements Length: (cm) 2.5 Width: (cm) 1 Depth: (cm) 0.4 Area: (cm) 1.963 Volume: (cm) 0.785 % Reduction in Area: 90% % Reduction in Volume: 80% Epithelialization: None Tunneling: No Undermining: No Wound Description Classification: Category/Stage II Foul O Diabetic Severity (Wagner): Grade 1 Slough Wound Margin: Distinct, outline attached Exudate Amount: Large Exudate Type: Serosanguineous Exudate Color: red, brown dor After Cleansing: No /Fibrino No Wound Bed Granulation Amount: None Present  (0%) Tucker, [name]. ([id number]) Necrotic Amount: Large (67-100%) Necrotic Quality: Eschar, Adherent Slough Periwound Skin Texture Texture Color No Abnormalities Noted: No No Abnormalities Noted: No Erythema: Yes Moisture Erythema Location: Circumferential No Abnormalities Noted: No Temperature / Pain Temperature: No Abnormality Tenderness on Palpation: Yes Wound Preparation Ulcer Cleansing: Rinsed/Irrigated with Saline Topical Anesthetic Applied: Other: LIDOCAINE 4%, Treatment Notes Wound #4 (Right Calcaneus) 1. Cleansed with: Clean wound with Normal Saline 2. Anesthetic Topical Lidocaine 4% cream to wound bed prior to debridement 4. Dressing Applied: Prisma Ag 5. Secondary Dressing Applied Dry Gauze 7. Secured with Tape Notes heel cup Electronic Signature(s) Signed: 02/22/2017 5:20:09 PM By: Alric Quan Entered By: Alric Quan on 02/22/2017 16:02:12 [name], [name] ([id number]) -------------------------------------------------------------------------------- Wound Assessment Details Patient Name: [name] Date of Service: 02/22/2017 3:30 PM Medical Record Patient Account Number: [id number] Number: Treating RN: Ahmed Prima 09-08-1934 (83 y.o. Other Clinician: Date of Birth/Sex: Female) Treating ROBSON, [place] Primary Care Haislee Corso: Emily Filbert Muhammadali Ries/Extender: G Referring Amarii Amy: Melina Modena in Treatment: 7 Wound Status Wound Number: 6 Primary Diabetic Wound/Ulcer of the Lower Etiology: Extremity Wound Location: Right Lower Leg - Lateral, Posterior Wound Open Status: Wounding Event: Gradually Appeared Comorbid Sleep Apnea, Arrhythmia, Hypertension, Date Acquired: 11/04/2016 History: Cirrhosis , Type II Diabetes, End Stage Weeks Of Treatment: 7 Renal Disease, Gout, Osteoarthritis Clustered Wound: No Photos Photo Uploaded By: Alric Quan on 02/22/2017 17:08:30 Wound Measurements Length: (cm) 4.8 Width: (cm)  2.2 Depth: (cm) 1.8 Area: (cm) 8.294 Volume: (cm) 14.929 % Reduction in Area: 18.8% % Reduction in Volume: 2.5% Epithelialization: None Tunneling: No Undermining: No Wound Description Classification: Grade 2 Foul Odor Aft Wound Margin: Distinct, outline attached Slough/Fibrin Exudate Amount: Large Exudate Type: Serosanguineous Exudate Color: red, brown er Cleansing: No o No Wound Bed Granulation Amount: Large (67-100%) Granulation Quality: Red, Pink Burridge, [name]. ([id number]) Necrotic Amount: Small (1-33%)  Necrotic Quality: Adherent Slough Periwound Skin Texture Texture Color No Abnormalities Noted: No No Abnormalities Noted: No Erythema: Yes Moisture Erythema Location: Circumferential No Abnormalities Noted: No Maceration: Yes Temperature / Pain Temperature: No Abnormality Tenderness on Palpation: Yes Wound Preparation Ulcer Cleansing: Rinsed/Irrigated with Saline, Other: soap and water, Topical Anesthetic Applied: Other: LIDOCAINE 4%, Treatment Notes Wound #6 (Right, Lateral, Posterior Lower Leg) 1. Cleansed with: Clean wound with Normal Saline Cleanse wound with antibacterial soap and water 2. Anesthetic Topical Lidocaine 4% cream to wound bed prior to debridement 4. Dressing Applied: Prisma Ag 5. Secondary Dressing Applied Bordered Foam Dressing Electronic Signature(s) Signed: 02/22/2017 5:20:09 PM By: Alric Quan Entered By: Alric Quan on 02/22/2017 16:03:51 [name] ([id number]) -------------------------------------------------------------------------------- Wound Assessment Details Patient Name: [name] Date of Service: 02/22/2017 3:30 PM Medical Record Patient Account Number: [id number] Number: Treating RN: Ahmed Prima Jul 07, 1934 (83 y.o. Other Clinician: Date of Birth/Sex: Female) Treating ROBSON, [place] Primary Care Windell Musson: Emily Filbert Kouper Spinella/Extender: G Referring Maki Hege: Melina Modena in  Treatment: 7 Wound Status Wound Number: 7 Primary Diabetic Wound/Ulcer of the Lower Etiology: Extremity Wound Location: Right Malleolus - Lateral Wound Open Wounding Event: Gradually Appeared Status: Date Acquired: 01/11/2017 Comorbid Sleep Apnea, Arrhythmia, Hypertension, Weeks Of Treatment: 6 History: Cirrhosis , Type II Diabetes, End Stage Clustered Wound: No Renal Disease, Gout, Osteoarthritis Photos Photo Uploaded By: Alric Quan on 02/22/2017 17:08:31 Wound Measurements Length: (cm) 0.2 Width: (cm) 0.2 Depth: (cm) 0.2 Area: (cm) 0.031 Volume: (cm) 0.006 % Reduction in Area: 93.8% % Reduction in Volume: 96% Epithelialization: None Tunneling: No Undermining: No Wound Description Classification: Grade 2 Foul Odor Afte Wound Margin: Distinct, outline attached Slough/Fibrino Exudate Amount: Large Exudate Type: Serous Exudate Color: amber Tucker Cleansing: No No Wound Bed Granulation Amount: None Present (0%) Necrotic Amount: Large (67-100%) Gilpatrick, Ladaysha Tucker. ([id number]) Necrotic Quality: Adherent Slough Periwound Skin Texture Texture Color No Abnormalities Noted: No No Abnormalities Noted: No Moisture No Abnormalities Noted: No Wound Preparation Ulcer Cleansing: Rinsed/Irrigated with Saline Topical Anesthetic Applied: Other: lidocaine 4%, Treatment Notes Wound #7 (Right, Lateral Malleolus) 1. Cleansed with: Clean wound with Normal Saline Cleanse wound with antibacterial soap and water 2. Anesthetic Topical Lidocaine 4% cream to wound bed prior to debridement 4. Dressing Applied: Prisma Ag 5. Secondary Dressing Applied ABD Pad 7. Secured with Tape 3 Layer Compression System - Bilateral Electronic Signature(s) Signed: 02/22/2017 5:20:09 PM By: Alric Quan Entered By: Alric Quan on 02/22/2017 16:04:36 [name] ([id number]) -------------------------------------------------------------------------------- Wound Assessment Details Patient  Name: [name] Date of Service: 02/22/2017 3:30 PM Medical Record Patient Account Number: [id number] Number: Treating RN: Ahmed Prima Aug 23, 1934 (83 y.o. Other Clinician: Date of Birth/Sex: Female) Treating ROBSON, [place] Primary Care Antavious Spanos: Emily Filbert Lucill Mauck/Extender: G Referring Bertil Brickey: Melina Modena in Treatment: 7 Wound Status Wound Number: 9 Primary Diabetic Wound/Ulcer of the Lower Etiology: Extremity Wound Location: Left Metatarsal head first - Lateral Wound Open Status: Wounding Event: Gradually Appeared Comorbid Sleep Apnea, Arrhythmia, Hypertension, Date Acquired: 02/01/2017 History: Cirrhosis , Type II Diabetes, End Stage Weeks Of Treatment: 3 Renal Disease, Gout, Osteoarthritis Clustered Wound: No Photos Photo Uploaded By: Alric Quan on 02/22/2017 17:08:49 Wound Measurements Length: (cm) 0.6 Width: (cm) 0.7 Depth: (cm) 0.1 Area: (cm) 0.33 Volume: (cm) 0.033 % Reduction in Area: -251.1% % Reduction in Volume: -266.7% Epithelialization: None Tunneling: No Undermining: No Wound Description Classification: Grade 2 Foul Odor Afte Wound Margin: Distinct, outline attached Slough/Fibrino Exudate Amount: Large Exudate Type: Serosanguineous Exudate Color:  red, brown Tucker Cleansing: No No Wound Bed Granulation Amount: None Present (0%) Necrotic Amount: Large (67-100%) Ankney, Kasumi Tucker. ([id number]) Necrotic Quality: Eschar Periwound Skin Texture Texture Color No Abnormalities Noted: No No Abnormalities Noted: No Moisture Temperature / Pain No Abnormalities Noted: No Tenderness on Palpation: Yes Maceration: Yes Wound Preparation Ulcer Cleansing: Rinsed/Irrigated with Saline, Other: soap and water, Topical Anesthetic Applied: Other: lidocaine 4%, Treatment Notes Wound #9 (Left, Lateral Metatarsal head first) 1. Cleansed with: Clean wound with Normal Saline Cleanse wound with antibacterial soap and water 2.  Anesthetic Topical Lidocaine 4% cream to wound bed prior to debridement 4. Dressing Applied: Prisma Ag 5. Secondary Dressing Applied ABD Pad 7. Secured with Tape 3 Layer Compression System - Bilateral Electronic Signature(s) Signed: 02/22/2017 5:20:09 PM By: Alric Quan Entered By: Alric Quan on 02/22/2017 16:05:30 [name] ([id number]) -------------------------------------------------------------------------------- Vitals Details Patient Name: [name] Date of Service: 02/22/2017 3:30 PM Medical Record Patient Account Number: [id number] Number: Treating RN: Ahmed Prima 1934/03/17 (83 y.o. Other Clinician: Date of Birth/Sex: Female) Treating ROBSON, MICHAEL Primary Care Jakylah Bassinger: Emily Filbert Willo Yoon/Extender: G Referring Amaris Delafuente: Melina Modena in Treatment: 7 Vital Signs Time Taken: 15:40 Temperature (F): 97.5 Height (in): 61 Pulse (bpm): 105 Weight (lbs): 206 Respiratory Rate (breaths/min): 16 Body Mass Index (BMI): 38.9 Blood Pressure (mmHg): 86/51 Reference Range: 80 - 120 mg / dl Electronic Signature(s) Signed: 02/22/2017 5:20:09 PM By: Alric Quan Entered By: Alric Quan on 02/22/2017 15:42:09

## 2017-02-23 NOTE — Progress Notes (Signed)
Connie, Tucker ([id number]) Visit Report for 02/22/2017 Chief Complaint Document Details Patient Name: [name], [name] Date of Service: 02/22/2017 3:30 PM Medical Record Patient Account Number: [id number] Number: Treating RN: Ahmed Prima 03/09/1934 (83 y.o. Other Clinician: Date of Birth/Sex: Female) Treating ROBSON, MICHAEL Primary Care Provider: Emily Filbert Provider/Extender: G Referring Provider: Melina Modena in Treatment: 7 Information Obtained from: Patient Chief Complaint 01/04/17; patient is here for review of wounds on her bilateral lower extremities referred from her primary physician Dr. Emily Filbert [facility] Electronic Signature(s) Signed: 02/23/2017 7:54:47 AM By: Linton Ham MD Entered By: Linton Ham on 02/22/2017 17:01:29 [name] ([id number]) -------------------------------------------------------------------------------- HPI Details Patient Name: [name] Date of Service: 02/22/2017 3:30 PM Medical Record Patient Account Number: [id number] Number: Treating RN: Ahmed Prima 05/11/1934 (83 y.o. Other Clinician: Date of Birth/Sex: Female) Treating ROBSON, MICHAEL Primary Care Provider: Emily Filbert Provider/Extender: G Referring Provider: Melina Modena in Treatment: 7 History of Present Illness HPI Description: 01/04/17; this is a 83 year old fairly disabled woman who comes accompanied by her husband today. She is here for review of wounds on her bilateral feet and right calf. There is a hopeful note from Dr. Sabra Heck that is included. The patient is a type II diabetic with end-stage renal failure on dialysis, she is here for bilateral wounds in her feet and heels as well as the right calf wound posteriorly. The history here is a bit difficult to follow. Her husband states that she had a fracture of her leg in October. Indeed looking [facility] she had a closed nondisplaced fracture of  the lateral condyle of the right femur. This was managed nonoperatively with a knee brace. Her husband thinks that the brace itself caused the wounds on the right leg although that would not of caused ones on the left. Apparently the wounds have been there for 2 months and the patient is being followed by [facility] with bilateral Unna boots. She is also followed in Dr. Ozella Almond office of vascular surgery. She had recent arterial studies that showed biphasic waveforms bilaterally with a ABI on the right of 1.01 on the left at 0.93. T ABIs on the right at 0.72 and on the left at 0.75. He was not felt to have significant arterial disease. I am also not clear what [facility] was primarily dressing the wounds with under the wraps the patient had an admission on 10/17 with an acute right leg DVT status post IVC filter placement and was placed on Eliquis for 6 weeks only. 01/11/17; the x-rays that I ordered last week on this patient have been completed the left is the problem area. She is had a previous distal tib-fib fracture which appears to be better than the last imaging view I can find in September. She appears to have osteomyelitis of the lateral aspect of the talus. Left foot showed bone resorption and loss of cortical Along the medial margin of the navicular and medial cuneiform consistent with on the osteomyelitis. rRght foot and ankle showed diffuse soft tissue swelling without obvious focal bone destruction there was a suggestion of a right fourth metatarsal fracture. boen I actually tried to get more history out of the patient and her husband who is present I've also spoken to her son on the phone. It would appear that the deep area on the lateral aspect of her right knee was abrasive injury also the area on the lateral aspect just above the ankle.  The she also probably consistent with pressure ulcers and dorsal ankle extensive wounds probably wrap injuries although I am guessing  about some of this. She does not have an arterial issue per Dr. dew. Her left tib-fib fracture was apparently in April as well she had a left femur fracture sometime in 2014 2015. She has a rod in her left femur hopefully we can get her through an MRI. I looked [facility] back to 2009 I don't actually see an x-ray of the left femur. 01/18/17; her MRI is booked for February 13. We had some trouble with home health and the dressings that were prescribed so they left the current wrap on all week. There is increasing erythema on the medial aspect of the right foot compatible with cellulitis she will need an antibiotic KEIRA, BOHLIN ([id number]) 01/25/17; patient's MRI is actually booked for February 15 not used Dragon. This is a patient that has multiple bilateral lower extremity wounds. I have never been completely certain how these happened and in fact they may be multifactorial. I have suspected that the area on her right lateral upper leg, right lateral lower leg were probably brace injuries. She has 2 large wounds with exposed tendon on her dorsal feet bilaterally. I suspect these were wrap injuries. She has pressure ulcers on her bilateral heels. She does not have significant arterial insufficiency and is previously been seen by Dr.Dew. A plain x-ray of her left foot suggested osteomyelitis which is the reason for the MRI. I gave her doxycycline last week for left foot cellulitis which looks better. 02/01/17; her MRI of the left foot showed diffuse cellulitis and mild fasciitis without drainable soft tissue abscess or pyomyositis. She does however have osteomyelitis involving the medial navicular bone, proximal aspect of the medial cuneiform medially and also possible involvement of the distal medial aspect of the talus. The left foot continues to have a large wound on the anterior foot with exposed tendon and a unstageable wound on the right heel covered in a thick black eschar.  Extensive discussion with the patient today. Probably the better option is a left BKA. She is nonambulatory. With regards to her right foot and leg the wounds are stable. There is necrotic tendon in the right anterior dorsal foot, exposed tendon in the right Achilles area and a very deep wound with exposed tendon on the lateral aspect of her right knee. Nevertheless all of these wounds looks somewhat viable to me. I did not MRI of the right leg as the x-ray did not show evidence of osteomyelitis 02/08/17; with regards to her right foot including the dorsal foot and the right heel this is about the same. MRI shows underlying osteomyelitis again an extensive discussion about amputation the patient still seems ambivalent. The areas over the left dorsal foot left medial ankle and the deep wound over the right lateral knee are stable to improved. Given her reasonably normal vascularity I think this foot and limb or potentially salvageable. X-ray of her foot on the right did not show osteomyelitis. I have urged her to consider a left BKA 02/15/17 patient returns today. Saw Dr. dew this morning. Apparently the understanding that the patient has is that there is no urgency to the amputation. While this is technically true the patient has underlying osteomyelitis with extensive tissue loss in her left foot. I have not actually talked to Dr. Lucky Cowboy or seen his note and I'll wait to look at this and call him if necessary.  Meantime there is no major change to the wounds which in this case actually is probably a good thing since there is high risk of deterioration in this nonambulatory woman 02/22/17; I spoke to Dr. dew last week. We agreed that that she would need a left BKA. He is concerned about the nonhealing risk etc. Nevertheless there is too much tissue damage between the left heel wound, left dorsal foot wound, and the underlying osteomyelitis. She arrives today short of breath and wheezing. Electronic  Signature(s) Signed: 02/23/2017 7:54:47 AM By: Linton Ham MD Entered By: Linton Ham on 02/22/2017 17:02:27 [name] ([id number]) -------------------------------------------------------------------------------- Physical Exam Details Patient Name: [name] Date of Service: 02/22/2017 3:30 PM Medical Record Patient Account Number: [id number] Number: Treating RN: Ahmed Prima May 19, 1934 (83 y.o. Other Clinician: Date of Birth/Sex: Female) Treating ROBSON, MICHAEL Primary Care Provider: Emily Filbert Provider/Extender: G Referring Provider: Melina Modena in Treatment: 7 Constitutional Patient is hypertensive.. Pulse regular and within target range for patient.Marland Kitchen Respirations regular, non-labored and within target range.. Temperature is normal and within the target range for the patient.. Patient's appearance is neat and clean. Appears in no acute distress. Well nourished and well developed.. Eyes Conjunctivae clear. No discharge.Marland Kitchen Respiratory Above normal respiratory effort noted. Respiratory rate elveaated. Bilateral crackles suggestive of congestive heart failure.. Cardiovascular Heart rhythm and rate regular, without murmur or gallop. Her heart sounds are almost inaudible. JVP is elevated. Lymphatic None palpable in the popliteal or inguinal area.. Integumentary (Hair, Skin) The left heel eschar separated with. When drainage I went ahead and did a culture of this in case there is more delay in surgery.. Notes Wound exam; oThe patient's bilateral dorsal foot wounds are roughly in the same condition. I'm not going to do any debridement on the left foot, I had planned to remove some of the tendon on the right foot however there was closure of the wound with advancing epithelialization. I don't believe it will close over this but I decided to give it a chance to do so oThe areas on the left medial ankle also has exposed tendon but is smaller.  The othe area on the lateral left knee no longer has exposed tendon oThere is drainage coming out of the right heel which I cultured, the substantial wound on the right anterior foot is about the same Electronic Signature(s) Signed: 02/23/2017 7:54:47 AM By: Linton Ham MD Entered By: Linton Ham on 02/22/2017 17:05:04 [name] ([id number]) -------------------------------------------------------------------------------- Physician Orders Details Patient Name: [name] Date of Service: 02/22/2017 3:30 PM Medical Record Patient Account Number: [id number] Number: Treating RN: Ahmed Prima January 24, 1934 (83 y.o. Other Clinician: Date of Birth/Sex: Female) Treating ROBSON, MICHAEL Primary Care Provider: Emily Filbert Provider/Extender: G Referring Provider: Melina Modena in Treatment: 7 Verbal / Phone Orders: Yes Clinician: Carolyne Fiscal, Debi Read Back and Verified: Yes Diagnosis Coding Wound Cleansing Wound #1 Left,Dorsal Foot o Clean wound with Normal Saline. o Cleanse wound with mild soap and water Wound #2 Right,Dorsal Foot o Clean wound with Normal Saline. o Cleanse wound with mild soap and water Wound #3 Left Calcaneus o Clean wound with Normal Saline. o Cleanse wound with mild soap and water Wound #4 Right Calcaneus o Clean wound with Normal Saline. o Cleanse wound with mild soap and water Wound #6 Right,Lateral,Posterior Lower Leg o Clean wound with Normal Saline. o Cleanse wound with mild soap and water Wound #7 Right,Lateral Malleolus o Clean wound with Normal Saline. o Cleanse wound with  mild soap and water Anesthetic Wound #1 Left,Dorsal Foot o Topical Lidocaine 4% cream applied to wound bed prior to debridement - for clinic use only Wound #2 Right,Dorsal Foot o Topical Lidocaine 4% cream applied to wound bed prior to debridement - for clinic use only Wound #3 Left Calcaneus o Topical Lidocaine 4% cream  applied to wound bed prior to debridement - for clinic use only Wound #4 Right Calcaneus PRESLY, STEINRUCK. ([id number]) o Topical Lidocaine 4% cream applied to wound bed prior to debridement - for clinic use only Wound #6 Right,Lateral,Posterior Lower Leg o Topical Lidocaine 4% cream applied to wound bed prior to debridement - for clinic use only Wound #7 Right,Lateral Malleolus o Topical Lidocaine 4% cream applied to wound bed prior to debridement - for clinic use only Skin Barriers/Peri-Wound Care Wound #6 Right,Lateral,Posterior Lower Leg o Skin Prep Primary Wound Dressing Wound #1 Left,Dorsal Foot o Iodoflex Wound #2 Right,Dorsal Foot o Iodoflex Wound #3 Left Calcaneus o Medihoney gel Wound #4 Right Calcaneus o Prisma Ag - moisten with saline Wound #6 Right,Lateral,Posterior Lower Leg o Prisma Ag - moisten with saline Wound #7 Right,Lateral Malleolus o Prisma Ag - moisten with saline Wound #9 Left,Lateral Metatarsal head first o Prisma Ag - moisten with saline Secondary Dressing Wound #1 Left,Dorsal Foot o ABD pad o Dry Gauze Wound #2 Right,Dorsal Foot o ABD pad o Dry Gauze Wound #3 Left Calcaneus o Dry Gauze o Other - heel cup Wound #4 Right Calcaneus Len, Ritha R. ([id number]) o Dry Gauze o Other - heel cup Wound #6 Right,Lateral,Posterior Lower Leg o Dry Gauze o Boardered Foam Dressing Wound #7 Right,Lateral Malleolus o ABD pad o Dry Gauze Wound #9 Left,Lateral Metatarsal head first o ABD pad o Dry Gauze Dressing Change Frequency Wound #1 Left,Dorsal Foot o Three times weekly - pt will have this changed on Tuesdays in the [facility] Wound #2 Right,Dorsal Foot o Three times weekly - pt will have this changed on Tuesdays in the [facility] Wound #3 Left Calcaneus o Three times weekly - pt will have this changed on Tuesdays in the [facility] Wound #4 Right Calcaneus o Three times  weekly - pt will have this changed on Tuesdays in the [facility] Wound #6 Right,Lateral,Posterior Lower Leg o Three times weekly - pt will have this changed on Tuesdays in the [facility] Wound #7 Right,Lateral Malleolus o Three times weekly - pt will have this changed on Tuesdays in the [facility] Wound #9 Left,Lateral Metatarsal head first o Three times weekly - pt will have this changed on Tuesdays in the [facility] Follow-up Appointments Wound #1 Left,Dorsal Foot o Return Appointment in 1 week. Wound #2 Right,Dorsal Foot o Return Appointment in 1 week. Wound #3 Left Calcaneus o Return Appointment in 1 week. Wound #4 Right Calcaneus o Return Appointment in 1 week. JENNIER, SCHISSLER ([id number]) Wound #6 Right,Lateral,Posterior Lower Leg o Return Appointment in 1 week. Wound #7 Right,Lateral Malleolus o Return Appointment in 1 week. Wound #9 Left,Lateral Metatarsal head first o Return Appointment in 1 week. Edema Control Wound #1 Left,Dorsal Foot o 3 Layer Compression System - Bilateral o Elevate legs to the level of the heart and pump ankles as often as possible Wound #2 Right,Dorsal Foot o 3 Layer Compression System - Bilateral o Elevate legs to the level of the heart and pump ankles as often as possible Wound #3 Left Calcaneus o 3 Layer Compression System -  Bilateral o Elevate legs to the level of the heart and pump ankles as often as possible Wound #4 Right Calcaneus o 3 Layer Compression System - Bilateral o Elevate legs to the level of the heart and pump ankles as often as possible Wound #6 Right,Lateral,Posterior Lower Leg o 3 Layer Compression System - Bilateral o Elevate legs to the level of the heart and pump ankles as often as possible Wound #7 Right,Lateral Malleolus o 3 Layer Compression System - Bilateral o Elevate legs to the level of the heart and pump ankles as often as  possible Off-Loading Wound #1 Left,Dorsal Foot o Turn and reposition every 2 hours o Other: - float heels above heart level while in the bed Wound #2 Right,Dorsal Foot o Turn and reposition every 2 hours o Other: - float heels above heart level while in the bed Wound #3 Left Calcaneus o Turn and reposition every 2 hours o Other: - float heels above heart level while in the bed Nordell, Sakari R. ([id number]) Wound #4 Right Calcaneus o Turn and reposition every 2 hours o Other: - float heels above heart level while in the bed Wound #6 Right,Lateral,Posterior Lower Leg o Turn and reposition every 2 hours o Other: - float heels above heart level while in the bed Wound #7 Right,Lateral Malleolus o Turn and reposition every 2 hours o Other: - float heels above heart level while in the bed Additional Orders / Instructions Wound #1 Left,Dorsal Foot o Increase protein intake. Wound #2 Right,Dorsal Foot o Increase protein intake. Wound #3 Left Calcaneus o Increase protein intake. Wound #4 Right Calcaneus o Increase protein intake. Wound #6 Right,Lateral,Posterior Lower Leg o Increase protein intake. Wound #7 Right,Lateral Malleolus o Increase protein intake. Home Health Wound #1 [place] Visits o Home Health Nurse may visit PRN to address patientos wound care needs. o FACE TO FACE ENCOUNTER: MEDICARE and MEDICAID PATIENTS: I certify that this patient is under my care and that I had a face-to-face encounter that meets the physician face-to-face encounter requirements with this patient on this date. The encounter with the patient was in whole or in part for the following MEDICAL CONDITION: (primary reason for [place]) MEDICAL NECESSITY: I certify, that based on my findings, NURSING services are a medically necessary home health service. HOME BOUND STATUS: I certify that my clinical findings support that this  patient is homebound (i.e., Due to illness or injury, pt requires aid of supportive devices such as crutches, cane, wheelchairs, walkers, the use of special transportation or the assistance of another person to leave their place of residence. There is a normal inability to leave the home and doing so requires considerable and taxing effort. Other absences are for medical reasons / religious services and are infrequent or of short duration when for other reasons). ASHTEN, SARNOWSKI R. ([id number]) o If current dressing causes regression in wound condition, may D/C ordered dressing product/s and apply Normal Saline Moist Dressing daily until next [place] / Other MD appointment. [place] of regression in wound condition at [phone]. o Please direct any NON-WOUND related issues/requests for orders to patient's Primary Care Physician Wound #2 [place] Nurse may visit PRN to address patientos wound care needs. o FACE TO FACE ENCOUNTER: MEDICARE and MEDICAID PATIENTS: I certify that this patient is under my care and that I had a face-to-face encounter that meets the physician face-to-face encounter requirements with  this patient on this date. The encounter with the patient was in whole or in part for the following MEDICAL CONDITION: (primary reason for [place]) MEDICAL NECESSITY: I certify, that based on my findings, NURSING services are a medically necessary home health service. HOME BOUND STATUS: I certify that my clinical findings support that this patient is homebound (i.e., Due to illness or injury, pt requires aid of supportive devices such as crutches, cane, wheelchairs, walkers, the use of special transportation or the assistance of another person to leave their place of residence. There is a normal inability to leave the home and doing so requires considerable and taxing effort.  Other absences are for medical reasons / religious services and are infrequent or of short duration when for other reasons). o If current dressing causes regression in wound condition, may D/C ordered dressing product/s and apply Normal Saline Moist Dressing daily until next [place] / Other MD appointment. [place] of regression in wound condition at [phone]. o Please direct any NON-WOUND related issues/requests for orders to patient's Primary Care Physician Wound #3 Left [place] Nurse may visit PRN to address patientos wound care needs. o FACE TO FACE ENCOUNTER: MEDICARE and MEDICAID PATIENTS: I certify that this patient is under my care and that I had a face-to-face encounter that meets the physician face-to-face encounter requirements with this patient on this date. The encounter with the patient was in whole or in part for the following MEDICAL CONDITION: (primary reason for [place]) MEDICAL NECESSITY: I certify, that based on my findings, NURSING services are a medically necessary home health service. HOME BOUND STATUS: I certify that my clinical findings support that this patient is homebound (i.e., Due to illness or injury, pt requires aid of supportive devices such as crutches, cane, wheelchairs, walkers, the use of special transportation or the assistance of another person to leave their place of residence. There is a normal inability to leave the home and doing so requires considerable and taxing effort. Other absences are for medical reasons / religious services and are infrequent or of short duration when for other reasons). o If current dressing causes regression in wound condition, may D/C ordered dressing product/s and apply Normal Saline Moist Dressing daily until next [place] / Other MD appointment. [place] of regression in wound condition  at [phone]. o Please direct any NON-WOUND related issues/requests for orders to patient's Primary Care Physician Wound #4 Right [place] Visits KESLEY, MULLENS ([id number] Nurse may visit PRN to address patientos wound care needs. o FACE TO FACE ENCOUNTER: MEDICARE and MEDICAID PATIENTS: I certify that this patient is under my care and that I had a face-to-face encounter that meets the physician face-to-face encounter requirements with this patient on this date. The encounter with the patient was in whole or in part for the following MEDICAL CONDITION: (primary reason for [place]) MEDICAL NECESSITY: I certify, that based on my findings, NURSING services are a medically necessary home health service. HOME BOUND STATUS: I certify that my clinical findings support that this patient is homebound (i.e., Due to illness or injury, pt requires aid of supportive devices such as crutches, cane, wheelchairs, walkers, the use of special transportation or the assistance of another person to leave their place of residence. There is a normal inability to leave the home and doing so requires considerable and taxing effort. Other  absences are for medical reasons / religious services and are infrequent or of short duration when for other reasons). o If current dressing causes regression in wound condition, may D/C ordered dressing product/s and apply Normal Saline Moist Dressing daily until next [place] / Other MD appointment. [place] of regression in wound condition at [phone]. o Please direct any NON-WOUND related issues/requests for orders to patient's Primary Care Physician Wound #6 [place] Nurse may visit PRN to address patientos wound care needs. o FACE TO FACE ENCOUNTER: MEDICARE and MEDICAID PATIENTS: I certify that this  patient is under my care and that I had a face-to-face encounter that meets the physician face-to-face encounter requirements with this patient on this date. The encounter with the patient was in whole or in part for the following MEDICAL CONDITION: (primary reason for [place]) MEDICAL NECESSITY: I certify, that based on my findings, NURSING services are a medically necessary home health service. HOME BOUND STATUS: I certify that my clinical findings support that this patient is homebound (i.e., Due to illness or injury, pt requires aid of supportive devices such as crutches, cane, wheelchairs, walkers, the use of special transportation or the assistance of another person to leave their place of residence. There is a normal inability to leave the home and doing so requires considerable and taxing effort. Other absences are for medical reasons / religious services and are infrequent or of short duration when for other reasons). o If current dressing causes regression in wound condition, may D/C ordered dressing product/s and apply Normal Saline Moist Dressing daily until next [place] / Other MD appointment. [place] of regression in wound condition at [phone]. o Please direct any NON-WOUND related issues/requests for orders to patient's Primary Care Physician Wound #7 [place] Nurse may visit PRN to address patientos wound care needs. o FACE TO FACE ENCOUNTER: MEDICARE and MEDICAID PATIENTS: I certify that this patient is under my care and that I had a face-to-face encounter that meets the physician face-to-face encounter requirements with this patient on this date. The encounter with the patient was in whole or in part for the following MEDICAL CONDITION: (primary reason for [place]) MEDICAL NECESSITY: I certify, that based on my findings, NURSING services are a  medically necessary home health service. HOME BOUND STATUS: I certify that my clinical findings support that this patient is homebound (i.e., Due to illness or injury, pt requires aid of SHARETA, FISHBAUGH. ([id number]) supportive devices such as crutches, cane, wheelchairs, walkers, the use of special transportation or the assistance of another person to leave their place of residence. There is a normal inability to leave the home and doing so requires considerable and taxing effort. Other absences are for medical reasons / religious services and are infrequent or of short duration when for other reasons). o If current dressing causes regression in wound condition, may D/C ordered dressing product/s and apply Normal Saline Moist Dressing daily until next [place] / Other MD appointment. [place] of regression in wound condition at [phone]. o Please direct any NON-WOUND related issues/requests for orders to patient's Primary Care Physician Wound #9 Left,Lateral Metatarsal head first o [place] Nurse may visit PRN to address patientos wound care needs. o FACE TO FACE ENCOUNTER: MEDICARE and MEDICAID PATIENTS: I certify that this  patient is under my care and that I had a face-to-face encounter that meets the physician face-to-face encounter requirements with this patient on this date. The encounter with the patient was in whole or in part for the following MEDICAL CONDITION: (primary reason for [place]) MEDICAL NECESSITY: I certify, that based on my findings, NURSING services are a medically necessary home health service. HOME BOUND STATUS: I certify that my clinical findings support that this patient is homebound (i.e., Due to illness or injury, pt requires aid of supportive devices such as crutches, cane, wheelchairs, walkers, the use of special transportation or the assistance of another person to leave their  place of residence. There is a normal inability to leave the home and doing so requires considerable and taxing effort. Other absences are for medical reasons / religious services and are infrequent or of short duration when for other reasons). o If current dressing causes regression in wound condition, may D/C ordered dressing product/s and apply Normal Saline Moist Dressing daily until next [place] / Other MD appointment. [place] of regression in wound condition at [phone]. o Please direct any NON-WOUND related issues/requests for orders to patient's Primary Care Physician Medications-please add to medication list. Wound #1 Left,Dorsal Foot o Other: - Vitamin C, Zinc, MVI Wound #2 Right,Dorsal Foot o Other: - Vitamin C, Zinc, MVI Wound #3 Left Calcaneus o Other: - Vitamin C, Zinc, MVI Wound #4 Right Calcaneus o Other: - Vitamin C, Zinc, MVI Wound #6 Right,Lateral,Posterior Lower Leg o Other: - Vitamin C, Zinc, MVI Wound #7 Right,Lateral Malleolus o Other: - Vitamin C, Zinc, MVI Meixner, Ardys R. ([id number]) Wound #9 Left,Lateral Metatarsal head first o Other: - Vitamin C, Zinc, MVI Laboratory o Bacteria identified in Wound by Culture (MICRO) oooo LOINC Code: 8657-8 oooo Convenience Name: Wound culture routine Electronic Signature(s) Signed: 02/22/2017 5:20:09 PM By: Alric Quan Signed: 02/23/2017 7:54:47 AM By: Linton Ham MD Entered By: Alric Quan on 02/22/2017 16:20:04 [name] ([id number]) -------------------------------------------------------------------------------- Problem List Details Patient Name: [name] Date of Service: 02/22/2017 3:30 PM Medical Record Patient Account Number: [id number] Number: Treating RN: Ahmed Prima 1934-06-19 (83 y.o. 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/04/2017 Yes muscle involvement without evidence of necrosis L97.515 Non-pressure chronic ulcer of other part of right foot with 01/04/2017 Yes muscle involvement without evidence of necrosis L89.613 Pressure ulcer of right heel, stage 3 01/04/2017 Yes L89.620 Pressure ulcer of left heel, unstageable 01/04/2017 Yes L97.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 Other Clinician: Date of Birth/Sex: Female) Treating ROBSON, MICHAEL Primary Care Provider: Emily Filbert Provider/Extender: G Referring Provider: Melina Modena in Treatment: 7 Subjective Chief Complaint Information obtained from Patient 01/04/17; patient is here for review of wounds on her bilateral lower extremities  referred from her primary physician Dr. Emily Filbert [facility] History of Present Illness (HPI) 01/04/17; this is a 83 year old fairly disabled woman who comes accompanied by her husband today. She is here for review of wounds on her bilateral feet and right calf. There is a hopeful note from Dr. Sabra Heck that is included. The  patient is a type II diabetic with end-stage renal failure on dialysis, she is here for bilateral wounds in her feet and heels as well as the right calf wound posteriorly. The history here is a bit difficult to follow. Her husband states that she had a fracture of her leg in October. Indeed looking through [facility] she had a closed nondisplaced fracture of the lateral condyle of the right femur. This was managed nonoperatively with a knee brace. Her husband thinks that the brace itself caused the wounds on the right leg although that would not of caused ones on the left. Apparently the wounds have been there for 2 months and the patient is being followed by [facility] with bilateral Unna boots. She is also followed in Dr. Ozella Almond office of vascular surgery. She had recent arterial studies that showed biphasic waveforms bilaterally with a ABI on the right of 1.01 on the left at 0.93. T ABIs on the right at 0.72 and on the left at 0.75. He was not felt to have significant arterial disease. I am also not clear what [facility] was primarily dressing the wounds with under the wraps the patient had an admission on 10/17 with an acute right leg DVT status post IVC filter placement and was placed on Eliquis for 6 weeks only. 01/11/17; the x-rays that I ordered last week on this patient have been completed the left is the problem area. She is had a previous distal tib-fib fracture which appears to be better than the last imaging view I can find in September. She appears to have osteomyelitis of the lateral aspect of the talus. Left foot showed bone resorption and loss of cortical Along the medial margin of the navicular and medial cuneiform consistent with on the osteomyelitis. rRght foot and ankle showed diffuse soft tissue swelling without obvious focal bone destruction there was a suggestion of a right fourth metatarsal fracture. boen I actually tried to get more history out of the  patient and her husband who is present I've also spoken to her son on the phone. It would appear that the deep area on the lateral aspect of her right knee was abrasive injury also the area on the lateral aspect just above the ankle. The she also probably consistent with pressure ulcers and dorsal ankle extensive wounds probably wrap injuries although I am guessing about some of this. She does not have an arterial issue per Dr. dew. Her left tib-fib fracture was apparently in April Leib, Salley ([id number]) as well she had a left femur fracture sometime in 2014 2015. She has a rod in her left femur hopefully we can get her through an MRI. I looked [facility] back to 2009 I don't actually see an x-ray of the left femur. 01/18/17; her MRI is booked for February 13. We had some trouble with home health and the dressings that were prescribed so they left the current wrap on all week. There is increasing erythema on the medial aspect of the right foot compatible with cellulitis she will need an antibiotic 01/25/17; patient's MRI is actually booked for February 15 not used Dragon. This  is a patient that has multiple bilateral lower extremity wounds. I have never been completely certain how these happened and in fact they may be multifactorial. I have suspected that the area on her right lateral upper leg, right lateral lower leg were probably brace injuries. She has 2 large wounds with exposed tendon on her dorsal feet bilaterally. I suspect these were wrap injuries. She has pressure ulcers on her bilateral heels. She does not have significant arterial insufficiency and is previously been seen by Dr.Dew. A plain x-ray of her left foot suggested osteomyelitis which is the reason for the MRI. I gave her doxycycline last week for left foot cellulitis which looks better. 02/01/17; her MRI of the left foot showed diffuse cellulitis and mild fasciitis without drainable soft tissue abscess or  pyomyositis. She does however have osteomyelitis involving the medial navicular bone, proximal aspect of the medial cuneiform medially and also possible involvement of the distal medial aspect of the talus. The left foot continues to have a large wound on the anterior foot with exposed tendon and a unstageable wound on the right heel covered in a thick black eschar. Extensive discussion with the patient today. Probably the better option is a left BKA. She is nonambulatory. With regards to her right foot and leg the wounds are stable. There is necrotic tendon in the right anterior dorsal foot, exposed tendon in the right Achilles area and a very deep wound with exposed tendon on the lateral aspect of her right knee. Nevertheless all of these wounds looks somewhat viable to me. I did not MRI of the right leg as the x-ray did not show evidence of osteomyelitis 02/08/17; with regards to her right foot including the dorsal foot and the right heel this is about the same. MRI shows underlying osteomyelitis again an extensive discussion about amputation the patient still seems ambivalent. The areas over the left dorsal foot left medial ankle and the deep wound over the right lateral knee are stable to improved. Given her reasonably normal vascularity I think this foot and limb or potentially salvageable. X-ray of her foot on the right did not show osteomyelitis. I have urged her to consider a left BKA 02/15/17 patient returns today. Saw Dr. dew this morning. Apparently the understanding that the patient has is that there is no urgency to the amputation. While this is technically true the patient has underlying osteomyelitis with extensive tissue loss in her left foot. I have not actually talked to Dr. Lucky Cowboy or seen his note and I'll wait to look at this and call him if necessary. Meantime there is no major change to the wounds which in this case actually is probably a good thing since there is high risk of  deterioration in this nonambulatory woman 02/22/17; I spoke to Dr. dew last week. We agreed that that she would need a left BKA. He is concerned about the nonhealing risk etc. Nevertheless there is too much tissue damage between the left heel wound, left dorsal foot wound, and the underlying osteomyelitis. She arrives today short of breath and wheezing. Objective VEENA, STURGESS ([id number]) Constitutional Patient is hypertensive.. Pulse regular and within target range for patient.Marland Kitchen Respirations regular, non-labored and within target range.. Temperature is normal and within the target range for the patient.. Patient's appearance is neat and clean. Appears in no acute distress. Well nourished and well developed.. Vitals Time Taken: 3:40 PM, Height: 61 in, Weight: 206 lbs, BMI: 38.9, Temperature: 97.5 F, Pulse: 105 bpm, Respiratory  Rate: 16 breaths/min, Blood Pressure: 86/51 mmHg. Eyes Conjunctivae clear. No discharge.Marland Kitchen Respiratory Above normal respiratory effort noted. Respiratory rate elveaated. Bilateral crackles suggestive of congestive heart failure.. Cardiovascular Heart rhythm and rate regular, without murmur or gallop. Her heart sounds are almost inaudible. JVP is elevated. Lymphatic None palpable in the popliteal or inguinal area.. General Notes: Wound exam; The patient's bilateral dorsal foot wounds are roughly in the same condition. I'm not going to do any debridement on the left foot, I had planned to remove some of the tendon on the right foot however there was closure of the wound with advancing epithelialization. I don't believe it will close over this but I decided to give it a chance to do so The areas on the left medial ankle also has exposed tendon but is smaller. The the area on the lateral left knee no longer has exposed tendon There is drainage coming out of the right heel which I cultured, the substantial wound on the right anterior foot is about the  same Integumentary (Hair, Skin) The left heel eschar separated with. When drainage I went ahead and did a culture of this in case there is more delay in surgery.. Wound #1 status is Open. Original cause of wound was Gradually Appeared. The wound is located on the Left,Dorsal Foot. The wound measures 3.4cm length x 4.8cm width x 0.8cm depth; 12.818cm^2 area and 10.254cm^3 volume. There is Fat Layer (Subcutaneous Tissue) Exposed exposed. There is no tunneling noted, however, there is undermining starting at 12:00 and ending at :00 with a maximum distance of 1cm. There is a large amount of serosanguineous drainage noted. The wound margin is well defined and not attached to the wound base. There is medium (34-66%) red, pink granulation within the wound bed. There is a medium (34-66%) amount of necrotic tissue within the wound bed including Eschar and Adherent Slough. The periwound skin appearance exhibited: Callus, Crepitus, Excoriation, Induration, Rash, Scarring, Dry/Scaly, Maceration, Atrophie Blanche, Cyanosis, Ecchymosis, Hemosiderin Staining, Mottled, Pallor, Rubor, Erythema. The surrounding wound skin color is noted with erythema. Periwound temperature was noted as No Abnormality. The periwound has tenderness on palpation. Wound #2 status is Open. Original cause of wound was Gradually Appeared. The wound is located on the Right,Dorsal Foot. The wound measures 5.5cm length x 3cm width x 0.2cm depth; 12.959cm^2 area and Strada, Iviona R. ([id number]) 2.592cm^3 volume. There is Fat Layer (Subcutaneous Tissue) Exposed exposed. There is no tunneling or undermining noted. There is a large amount of serosanguineous drainage noted. The wound margin is distinct with the outline attached to the wound base. There is medium (34-66%) pink granulation within the wound bed. There is a medium (34-66%) amount of necrotic tissue within the wound bed including Eschar and Adherent Slough. The periwound skin  appearance exhibited: Maceration, Erythema. The surrounding wound skin color is noted with erythema which is circumferential. Periwound temperature was noted as No Abnormality. The periwound has tenderness on palpation. Wound #3 status is Open. Original cause of wound was Pressure Injury. The wound is located on the Left Calcaneus. The wound measures 3.5cm length x 7.8cm width x 0.1cm depth; 21.441cm^2 area and 2.144cm^3 volume. There is no tunneling or undermining noted. There is a large amount of serous drainage noted. The wound margin is distinct with the outline attached to the wound base. There is no granulation within the wound bed. There is a large (67-100%) amount of necrotic tissue within the wound bed including Eschar. The periwound skin appearance exhibited: Erythema. The  surrounding wound skin color is noted with erythema which is circumferential. Periwound temperature was noted as No Abnormality. The periwound has tenderness on palpation. Wound #4 status is Open. Original cause of wound was Pressure Injury. The wound is located on the Right Calcaneus. The wound measures 2.5cm length x 1cm width x 0.4cm depth; 1.963cm^2 area and 0.785cm^3 volume. There is no tunneling or undermining noted. There is a large amount of serosanguineous drainage noted. The wound margin is distinct with the outline attached to the wound base. There is no granulation within the wound bed. There is a large (67-100%) amount of necrotic tissue within the wound bed including Eschar and Adherent Slough. The periwound skin appearance exhibited: Erythema. The surrounding wound skin color is noted with erythema which is circumferential. Periwound temperature was noted as No Abnormality. The periwound has tenderness on palpation. Wound #6 status is Open. Original cause of wound was Gradually Appeared. The wound is located on the Right,Lateral,Posterior Lower Leg. The wound measures 4.8cm length x 2.2cm width x 1.8cm  depth; 8.294cm^2 area and 14.929cm^3 volume. There is no tunneling or undermining noted. There is a large amount of serosanguineous drainage noted. The wound margin is distinct with the outline attached to the wound base. There is large (67-100%) red, pink granulation within the wound bed. There is a small (1-33%) amount of necrotic tissue within the wound bed including Adherent Slough. The periwound skin appearance exhibited: Maceration, Erythema. The surrounding wound skin color is noted with erythema which is circumferential. Periwound temperature was noted as No Abnormality. The periwound has tenderness on palpation. Wound #7 status is Open. Original cause of wound was Gradually Appeared. The wound is located on the Right,Lateral Malleolus. The wound measures 0.2cm length x 0.2cm width x 0.2cm depth; 0.031cm^2 area and 0.006cm^3 volume. There is no tunneling or undermining noted. There is a large amount of serous drainage noted. The wound margin is distinct with the outline attached to the wound base. There is no granulation within the wound bed. There is a large (67-100%) amount of necrotic tissue within the wound bed including Adherent Slough. Wound #9 status is Open. Original cause of wound was Gradually Appeared. The wound is located on the Left,Lateral Metatarsal head first. The wound measures 0.6cm length x 0.7cm width x 0.1cm depth; 0.33cm^2 area and 0.033cm^3 volume. There is no tunneling or undermining noted. There is a large amount of serosanguineous drainage noted. The wound margin is distinct with the outline attached to the wound base. There is no granulation within the wound bed. There is a large (67-100%) amount of necrotic tissue within the wound bed including Eschar. The periwound skin appearance exhibited: Maceration. The periwound has tenderness on palpation. 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 Cleanse wound with mild soap and water Wound #2 Right,Dorsal Foot: Clean wound with Normal Saline. Cleanse wound with mild soap and water Wound #3 Left Calcaneus: Clean wound with Normal Saline. Cleanse  wound with mild soap and water Wound #4 Right Calcaneus: Clean wound with Normal Saline. Cleanse wound with mild soap and water Wound #6 Right,Lateral,Posterior Lower Leg: Clean wound with Normal Saline. Cleanse wound with mild soap and water Wound #7 Right,Lateral Malleolus: Clean wound with Normal Saline. Cleanse wound with mild soap and water Anesthetic: Wound #1 Left,Dorsal Foot: Topical Lidocaine 4% cream applied to wound bed prior to debridement - for clinic use only Wound #2 Right,Dorsal Foot: Topical Lidocaine 4% cream applied to wound bed prior to debridement - for clinic use only Wound #3 Left Calcaneus: Topical Lidocaine 4% cream applied to wound bed prior to debridement - for clinic use only JACQULENE, HUNTLEY R. ([id number]) Wound #4 Right Calcaneus: Topical Lidocaine 4% cream applied to wound bed prior to debridement - for clinic use only Wound #6 Right,Lateral,Posterior Lower Leg: Topical Lidocaine 4% cream applied to wound bed prior to debridement - for clinic use only Wound #7 Right,Lateral Malleolus: Topical Lidocaine 4% cream applied to wound bed prior to debridement - for clinic use only Skin Barriers/Peri-Wound  Care: Wound #6 Right,Lateral,Posterior Lower Leg: Skin Prep Primary Wound Dressing: Wound #1 Left,Dorsal Foot: Iodoflex Wound #2 Right,Dorsal Foot: Iodoflex Wound #3 Left Calcaneus: Medihoney gel Wound #4 Right Calcaneus: Prisma Ag - moisten with saline Wound #6 Right,Lateral,Posterior Lower Leg: Prisma Ag - moisten with saline Wound #7 Right,Lateral Malleolus: Prisma Ag - moisten with saline Wound #9 Left,Lateral Metatarsal head first: Prisma Ag - moisten with saline Secondary Dressing: Wound #1 Left,Dorsal Foot: ABD pad Dry Gauze Wound #2 Right,Dorsal Foot: ABD pad Dry Gauze Wound #3 Left Calcaneus: Dry Gauze Other - heel cup Wound #4 Right Calcaneus: Dry Gauze Other - heel cup Wound #6 Right,Lateral,Posterior Lower Leg: Dry Gauze Boardered Foam Dressing Wound #7 Right,Lateral Malleolus: ABD pad Dry Gauze Wound #9 Left,Lateral Metatarsal head first: ABD pad Dry Gauze Dressing Change Frequency: Wound #1 Left,Dorsal Foot: Three times weekly - pt will have this changed on Tuesdays in the [facility] Wound #2 Right,Dorsal Foot: Three times weekly - pt will have this changed on Tuesdays in the [place] ([id number]) Wound #3 Left Calcaneus: Three times weekly - pt will have this changed on Tuesdays in the [facility] Wound #4 Right Calcaneus: Three times weekly - pt will have this changed on Tuesdays in the [facility] Wound #6 Right,Lateral,Posterior Lower Leg: Three times weekly - pt will have this changed on Tuesdays in the [facility] Wound #7 Right,Lateral Malleolus: Three times weekly - pt will have this changed on Tuesdays in the [facility] Wound #9 Left,Lateral Metatarsal head first: Three times weekly - pt will have this changed on Tuesdays in the [facility] Follow-up Appointments: Wound #1 Left,Dorsal Foot: Return Appointment in 1 week. Wound #2 Right,Dorsal Foot: Return Appointment in 1  week. Wound #3 Left Calcaneus: Return Appointment in 1 week. Wound #4 Right Calcaneus: Return Appointment in 1 week. Wound #6 Right,Lateral,Posterior Lower Leg: Return Appointment in 1 week. Wound #7 Right,Lateral Malleolus: Return Appointment in 1 week. Wound #9 Left,Lateral Metatarsal head first: Return Appointment in 1 week. Edema Control: Wound #1 Left,Dorsal Foot: 3 Layer Compression System - Bilateral Elevate legs to the level of the heart and pump ankles as often as possible Wound #2 Right,Dorsal Foot: 3 Layer Compression System - Bilateral Elevate legs to the level of the heart and pump ankles as often as possible Wound #3 Left Calcaneus: 3 Layer Compression System -  Bilateral Elevate legs to the level of the heart and pump ankles as often as possible Wound #4 Right Calcaneus: 3 Layer Compression System - Bilateral Elevate legs to the level of the heart and pump ankles as often as possible Wound #6 Right,Lateral,Posterior Lower Leg: 3 Layer Compression System - Bilateral Elevate legs to the level of the heart and pump ankles as often as possible Wound #7 Right,Lateral Malleolus: 3 Layer Compression System - Bilateral Elevate legs to the level of the heart and pump ankles as often as possible Off-Loading: Wound #1 Left,Dorsal Foot: Turn and reposition every 2 hours Other: - float heels above heart level while in the bed Wound #2 Right,Dorsal Foot: Turn and reposition every 2 hours Other: - float heels above heart level while in the bed Cristiano, Caroleena R. ([id number]) Wound #3 Left Calcaneus: Turn and reposition every 2 hours Other: - float heels above heart level while in the bed Wound #4 Right Calcaneus: Turn and reposition every 2 hours Other: - float heels above heart level while in the bed Wound #6 Right,Lateral,Posterior Lower Leg: Turn and reposition every 2 hours Other: - float heels above heart level while in the bed Wound #7 Right,Lateral Malleolus: Turn and  reposition every 2 hours Other: - float heels above heart level while in the bed Additional Orders / Instructions: Wound #1 Left,Dorsal Foot: Increase protein intake. Wound #2 Right,Dorsal Foot: Increase protein intake. Wound #3 Left Calcaneus: Increase protein intake. Wound #4 Right Calcaneus: Increase protein intake. Wound #6 Right,Lateral,Posterior Lower Leg: Increase protein intake. Wound #7 Right,Lateral Malleolus: Increase protein intake. Home Health: Wound #1 Left,Dorsal Foot: [place] Nurse may visit PRN to address patient s wound care needs. FACE TO FACE ENCOUNTER: MEDICARE and MEDICAID PATIENTS: I certify that this patient is under my care and that I had a face-to-face encounter that meets the physician face-to-face encounter requirements with this patient on this date. The encounter with the patient was in whole or in part for the following MEDICAL CONDITION: (primary reason for [place]) MEDICAL NECESSITY: I certify, that based on my findings, NURSING services are a medically necessary home health service. HOME BOUND STATUS: I certify that my clinical findings support that this patient is homebound (i.e., Due to illness or injury, pt requires aid of supportive devices such as crutches, cane, wheelchairs, walkers, the use of special transportation or the assistance of another person to leave their place of residence. There is a normal inability to leave the home and doing so requires considerable and taxing effort. Other absences are for medical reasons / religious services and are infrequent or of short duration when for other reasons). If current dressing causes regression in wound condition, may D/C ordered dressing product/s and apply Normal Saline Moist Dressing daily until next [place] / Other MD appointment. [place] of regression in wound condition at [phone]. Please direct any NON-WOUND  related issues/requests for orders to patient's Primary Care Physician Wound #2 Right,Dorsal Foot: [place] Nurse may visit PRN to address patient s wound care needs. FACE TO FACE ENCOUNTER: MEDICARE and MEDICAID PATIENTS: I certify that this patient is under my care and that I had a face-to-face encounter that meets the physician face-to-face encounter requirements with this patient on this date. The encounter with the patient was in whole or in part for the following MEDICAL CONDITION: (primary reason for [place]) MEDICAL NECESSITY: I certify, that based on my findings,  NURSING services are a medically necessary home health service. [place] ([id number]) BOUND STATUS: I certify that my clinical findings support that this patient is homebound (i.e., Due to illness or injury, pt requires aid of supportive devices such as crutches, cane, wheelchairs, walkers, the use of special transportation or the assistance of another person to leave their place of residence. There is a normal inability to leave the home and doing so requires considerable and taxing effort. Other absences are for medical reasons / religious services and are infrequent or of short duration when for other reasons). If current dressing causes regression in wound condition, may D/C ordered dressing product/s and apply Normal Saline Moist Dressing daily until next [employer] / Other MD appointment. [place] of regression in wound condition at [phone]. Please direct any NON-WOUND related issues/requests for orders to patient's Primary Care Physician Wound #3 Left Calcaneus: [place] Nurse may visit PRN to address patient s wound care needs. FACE TO FACE ENCOUNTER: MEDICARE and MEDICAID PATIENTS: I certify that this patient is under my care and that I had a face-to-face encounter that meets the physician face-to-face  encounter requirements with this patient on this date. The encounter with the patient was in whole or in part for the following MEDICAL CONDITION: (primary reason for [place]) MEDICAL NECESSITY: I certify, that based on my findings, NURSING services are a medically necessary home health service. HOME BOUND STATUS: I certify that my clinical findings support that this patient is homebound (i.e., Due to illness or injury, pt requires aid of supportive devices such as crutches, cane, wheelchairs, walkers, the use of special transportation or the assistance of another person to leave their place of residence. There is a normal inability to leave the home and doing so requires considerable and taxing effort. Other absences are for medical reasons / religious services and are infrequent or of short duration when for other reasons). If current dressing causes regression in wound condition, may D/C ordered dressing product/s and apply Normal Saline Moist Dressing daily until next [place] / Other MD appointment. [place] of regression in wound condition at [phone]. Please direct any NON-WOUND related issues/requests for orders to patient's Primary Care Physician Wound #4 Right Calcaneus: [place] Nurse may visit PRN to address patient s wound care needs. FACE TO FACE ENCOUNTER: MEDICARE and MEDICAID PATIENTS: I certify that this patient is under my care and that I had a face-to-face encounter that meets the physician face-to-face encounter requirements with this patient on this date. The encounter with the patient was in whole or in part for the following MEDICAL CONDITION: (primary reason for [place]) MEDICAL NECESSITY: I certify, that based on my findings, NURSING services are a medically necessary home health service. HOME BOUND STATUS: I certify that my clinical findings support that this patient is homebound (i.e.,  Due to illness or injury, pt requires aid of supportive devices such as crutches, cane, wheelchairs, walkers, the use of special transportation or the assistance of another person to leave their place of residence. There is a normal inability to leave the home and doing so requires considerable and taxing effort. Other absences are for medical reasons / religious services and are infrequent or of short duration when for other reasons). If current dressing causes regression in wound condition, may D/C ordered dressing product/s and apply Normal Saline Moist Dressing daily until [facility] Other MD appointment. [place] of regression in wound condition at [phone]. Please direct any NON-WOUND related issues/requests for orders to patient's Primary Care Physician Wound #6 Right,Lateral,Posterior Lower Leg: [place] Nurse may visit PRN to address patient s wound care needs. FACE TO FACE ENCOUNTER: MEDICARE and MEDICAID PATIENTS: I certify that this patient is under my care and that I had a face-to-face encounter that meets the physician face-to-face encounter requirements with this patient on this date. The encounter with the patient was in whole or in part for the following MEDICAL CONDITION: (primary reason for [place]) MEDICAL NECESSITY: I certify, that based on my findings, NURSING services are a medically necessary home health service. [place] ([id number]) BOUND STATUS: I certify that my clinical findings support that this patient is homebound (i.e., Due to illness or injury, pt requires aid of supportive devices such as crutches, cane, wheelchairs, walkers, the use of special transportation or the assistance of another person to leave their place of residence. There is a normal inability to leave the home and doing so requires considerable and taxing effort. Other absences are for medical reasons /  religious services and are infrequent or of short duration when for other reasons). If current dressing causes regression in wound condition, may D/C ordered dressing product/s and apply Normal Saline Moist Dressing daily until next [place] / Other MD appointment. [place] of regression in wound condition at [phone]. Please direct any NON-WOUND related issues/requests for orders to patient's Primary Care Physician Wound #7 Right,Lateral Malleolus: [place] Nurse may visit PRN to address patient s wound care needs. FACE TO FACE ENCOUNTER: MEDICARE and MEDICAID PATIENTS: I certify that this patient is under my care and that I had a face-to-face encounter that meets the physician face-to-face encounter requirements with this patient on this date. The encounter with the patient was in whole or in part for the following MEDICAL CONDITION: (primary reason for [place]) MEDICAL NECESSITY: I certify, that based on my findings, NURSING services are a medically necessary home health service. HOME BOUND STATUS: I certify that my clinical findings support that this patient is homebound (i.e., Due to illness or injury, pt requires aid of supportive devices such as crutches, cane, wheelchairs, walkers, the use of special transportation or the assistance of another person to leave their place of residence. There is a normal inability to leave the home and doing so requires considerable and taxing effort. Other absences are for medical reasons / religious services and are infrequent or of short duration when for other reasons). If current dressing causes regression in wound condition, may D/C ordered dressing product/s and apply Normal Saline Moist Dressing daily until next [place] / Other MD appointment. [place] of regression in wound condition at [phone]. Please direct any NON-WOUND related  issues/requests for orders to patient's Primary Care Physician Wound #9 Left,Lateral Metatarsal head first: [place] Nurse may visit PRN to address patient s wound care needs. FACE TO FACE ENCOUNTER: MEDICARE and MEDICAID PATIENTS: I certify that this patient is under my care and that I had a face-to-face encounter that meets the physician face-to-face encounter requirements with this patient on this date. The encounter with the patient was in whole or in part for the following MEDICAL CONDITION: (primary reason for [place]) MEDICAL NECESSITY: I certify, that based on my findings,  NURSING services are a medically necessary home health service. HOME BOUND STATUS: I certify that my clinical findings support that this patient is homebound (i.e., Due to illness or injury, pt requires aid of supportive devices such as crutches, cane, wheelchairs, walkers, the use of special transportation or the assistance of another person to leave their place of residence. There is a normal inability to leave the home and doing so requires considerable and taxing effort. Other absences are for medical reasons / religious services and are infrequent or of short duration when for other reasons). If current dressing causes regression in wound condition, may D/C ordered dressing product/s and apply Normal Saline Moist Dressing daily until next [place] / Other MD appointment. [place] of regression in wound condition at [phone]. Please direct any NON-WOUND related issues/requests for orders to patient's Primary Care Physician Medications-please add to medication list.: Wound #1 Left,Dorsal Foot: Other: - Vitamin C, Zinc, MVI Wound #2 Right,Dorsal Foot: Other: - Vitamin C, Zinc, MVI Wound #3 Left Calcaneus: Other: - Vitamin C, Zinc, MVI Wound #4 Right Calcaneus: JERUSALEN, MATEJA. ([id number]) Other: - Vitamin C, Zinc, MVI Wound #6  Right,Lateral,Posterior Lower Leg: Other: - Vitamin C, Zinc, MVI Wound #7 Right,Lateral Malleolus: Other: - Vitamin C, Zinc, MVI Wound #9 Left,Lateral Metatarsal head first: Other: - Vitamin C, Zinc, MVI Laboratory ordered were: Wound culture routine #1 we continued with Iodoflex to the anterior foot wounds. I will see if the right one will close over the tendon I am doubtful #2 Prisma to the medial left ankle and the right lateral knee. #3 I did a culture of the drainage HandP looked purulent and the left heel but no empiric antibiotics. I am waiting for a surgery date for a BKA by Dr. dew #4 the patient clearly has congestive heart failure, she tells me she has to go back to dialysis tomorrow and therefore I'm thinking that somebody is on top of this. I was actually going to call dialysis until her husband told me this. Electronic Signature(s) Signed: 02/23/2017 7:54:47 AM By: Linton Ham MD Entered By: Linton Ham on 02/22/2017 17:08:31 [name] ([id number]) -------------------------------------------------------------------------------- [place] Details Patient Name: [name] Date of Service: 02/22/2017 Medical Record Patient Account Number: [id number] Number: Treating RN: Ahmed Prima 1934/09/18 (83 y.o. 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

## 2017-02-23 NOTE — Progress Notes (Signed)
Subjective:    Patient ID: [name], female    DOB: October 21, 1934, 83 y.o.   MRN: [id number] Chief Complaint  Patient presents with  . Follow-up   Patient presents for lower extremity wound follow up and discussion about possible LLE BKA. Patient continues to be seen and treated by wound center (Dr. Dellia Nims). Recent MRI with osteomyelitis of left ankle. Patient continues weekly unna wraps. Denies any fever, nausea or vomiting. Discussion has taken place between family, patient and wound care MD in regard to progressive deterioration of her LLE and need to BKA as a treatment. The patient is comfortable with this decision and willing to undergo the procedure. Currently on ABX from wound center. Denies any issues with dialysis access.    Review of Systems  Constitutional: Negative.   HENT: Negative.   Eyes: Negative.   Respiratory: Negative.   Cardiovascular: Positive for leg swelling (Bilateral Lower Extremity Ulceration).  Gastrointestinal: Negative.   Endocrine: Negative.   Genitourinary:       ESRD  Musculoskeletal: Negative.   Skin:       LE Ulcerations  Allergic/Immunologic: Negative.   Neurological: Negative.   Hematological: Negative.   Psychiatric/Behavioral: Negative.       Objective:   Physical Exam Gen: WD/WN, NAD Head: [place]/AT, No temporalis wasting. Ear/Nose/Throat: Hearing grossly intact, nares w/o erythema or drainage, trachea midline Eyes: Conjunctiva clear. Sclera non-icteric Neck: Supple. No JVD.  Pulmonary: Good air movement, no use of accessory muscles.  Cardiac: Irregularly irregular Vascular: Thrill and bruit are present in left arm AV fistula Vessel Right Left  Radial Palpable Palpable  Ulnar Palpable Palpable  Brachial Palpable Palpable  Carotid Palpable, without bruit Palpable, without bruit  Aorta Not palpable N/A  Femoral Palpable Palpable  Popliteal Not Palpable Not Palpable  PT Not Palpable Not Palpable  DP Not Palpable Not Palpable    Left Upper Extremity: Fistula mildly aneurysmal, no skin threatening noted. No erythema, no cellulitis noted. No ecchymosis noted. Bruit and thrill noted.   Gastrointestinal: soft, non-tender/non-distended. No guarding/reflex.  Musculoskeletal: 4+, massive bilateral lower extremityedema.Wheelchair-bound Neurologic: Sensation grossly intact in extremities. Symmetrical. Speech is fluent.  Psychiatric: Judgment intact, Mood & affect appropriate for pt's clinical situation. Dermatologic: The patient has large ulcerations with necrotic eschar and fibrinous exudate on both heels, both anterior ankle and top of the foot areas, and the right posterior lateral calf area. All are reasonably large greater than 5 cm. None have any granulation tissue to speak of. Lymph : No Cervical, Axillary, or Inguinal lymphadenopathy  BP (!) 92/57   Pulse (!) 101   Resp 16   Past Medical History:  Diagnosis Date  . A-fib (HCC)    not on anticoagulation  . Anemia   . Bilateral lower extremity edema   . CHF (congestive heart failure) ([place])   . Diabetes mellitus without complication [facility])    Patient takes Insulin  . Dysrhythmia   . ESRD (end stage renal disease) ([place])    Monday, wednesday, Friday DIalysis  . Essential tremor   . HTN (hypertension)   . OSA (obstructive sleep apnea)   . Osteoarthritis   . Renal insufficiency    Patient is on dialysis and normal days are M,W and F.  . Skin cancer    Resected from legs    Social History   Social History  . Marital status: Married    Spouse name: N/A  . Number of children: N/A  . Years of education: N/A  Occupational History  . Not on file.   Social History Main Topics  . Smoking status: Never Smoker  . Smokeless tobacco: Never Used  . Alcohol use No  . Drug use: No  . Sexual activity: Not on file   Other Topics Concern  . Not on file   Social History Narrative  . No narrative on file    Past Surgical History:  Procedure  Laterality Date  . ABDOMINAL HYSTERECTOMY    . ANKLE FRACTURE SURGERY     right ankle  . CHOLECYSTECTOMY    . ESOPHAGOGASTRODUODENOSCOPY (EGD) WITH PROPOFOL N/A 10/09/2015   Procedure: ESOPHAGOGASTRODUODENOSCOPY (EGD) WITH PROPOFOL;  Surgeon: Hulen Luster, MD;  Location: [facility] ENDOSCOPY;  Service: Gastroenterology;  Laterality: N/A;  . FEMUR FRACTURE SURGERY     left femur  . KNEE ARTHROPLASTY    . NEUROPLASTY / TRANSPOSITION MEDIAN NERVE AT CARPAL TUNNEL BILATERAL    . PERIPHERAL VASCULAR CATHETERIZATION Left 08/06/2015   Procedure: A/V Shuntogram/Fistulagram;  Surgeon: Algernon Huxley, MD;  Location: [place] CV LAB;  Service: Cardiovascular;  Laterality: Left;  . PERIPHERAL VASCULAR CATHETERIZATION Left 11/20/2015   Procedure: A/V Shuntogram/Fistulagram;  Surgeon: Algernon Huxley, MD;  Location: [place] CV LAB;  Service: Cardiovascular;  Laterality: Left;  . PERIPHERAL VASCULAR CATHETERIZATION N/A 11/20/2015   Procedure: A/V Shunt Intervention;  Surgeon: Algernon Huxley, MD;  Location: [place] CV LAB;  Service: Cardiovascular;  Laterality: N/A;  . PERIPHERAL VASCULAR CATHETERIZATION N/A 09/27/2016   Procedure: IVC Filter Insertion;  Surgeon: Algernon Huxley, MD;  Location: [place] CV LAB;  Service: Cardiovascular;  Laterality: N/A;  . PERIPHERAL VASCULAR CATHETERIZATION Left 01/03/2017   Procedure: A/V Fistulagram;  Surgeon: Algernon Huxley, MD;  Location: [place] CV LAB;  Service: Cardiovascular;  Laterality: Left;  . PERIPHERAL VASCULAR CATHETERIZATION N/A 01/03/2017   Procedure: A/V Shunt Intervention;  Surgeon: Algernon Huxley, MD;  Location: [place] CV LAB;  Service: Cardiovascular;  Laterality: N/A;  . TUBAL LIGATION      Family History  Problem Relation Age of Onset  . Diabetes type II Father   . Breast cancer Sister 52  . Breast cancer Maternal Grandmother     Allergies  Allergen Reactions  . Angiotensin Receptor Blockers Other (See Comments)    Reaction:   Hyperkalemia  . Penicillins Rash and Other (See Comments)    rash       Assessment & Plan:  Patient presents for lower extremity wound follow up and discussion about possible LLE BKA. Patient continues to be seen and treated by wound center (Dr. Dellia Nims). Recent MRI with osteomyelitis of left ankle. Patient continues weekly unna wraps. Denies any fever, nausea or vomiting. Discussion has taken place between family, patient and wound care MD in regard to progressive deterioration of her LLE and need to BKA as a treatment. The patient is comfortable with this decision and willing to undergo the procedure. Currently on ABX from wound center. Denies any issues with dialysis access.   1. Lymphedema - Stable Patient with multiple non-healing ulcerations to the bilateral lower extremity. Now with osteomyelitis to the left ankle. Her condition has been chronic with little improvement despite relatively normal ABI's.  Since osteo is present would recommend BKA as definite treatment. Wound, family and patient are in agreement with treatment plan. Dr. Lucky Cowboy has spoke with Dr. Dellia Nims. Procedure, risks and benefits were explained to patient. All questions answered.  Will proceed with a LLE BKA.  2. ESRD  on hemodialysis ([place]) - Stable No issues with dialysis access at this time. Six month surveillance duplexes are recommend.   3. Type 2 diabetes mellitus without complication, with long-term current use of insulin (HCC) - Stable Encouraged good control as its slows the progression of atherosclerotic disease  Current Outpatient Prescriptions on File Prior to Visit  Medication Sig Dispense Refill  . allopurinol (ZYLOPRIM) 100 MG tablet Take 100 mg by mouth daily.     Marland Kitchen amiodarone (PACERONE) 100 MG tablet Take 100 mg by mouth daily.    . benzonatate (TESSALON) 100 MG capsule Take by mouth 3 (three) times daily as needed for cough.    . calcium acetate (PHOSLO) 667 MG capsule Take 1 capsule (667 mg total) by  mouth 2 (two) times daily between meals. 60 capsule 0  . cholecalciferol (VITAMIN D) 1000 units tablet Take 1,000 Units by mouth daily.    . clindamycin (CLEOCIN) 300 MG capsule Take 1 capsule (300 mg total) by mouth 3 (three) times daily. 30 capsule 0  . dextromethorphan (DELSYM COUGH CHILDRENS) 30 MG/5ML liquid Take by mouth as needed for cough.    . diltiazem (CARDIZEM CD) 180 MG 24 hr capsule Take 180 mg by mouth daily.    Marland Kitchen docusate sodium (COLACE) 100 MG capsule Take 1 capsule (100 mg total) by mouth 2 (two) times daily. 10 capsule 0  . fluticasone (FLONASE) 50 MCG/ACT nasal spray Place 2 sprays into both nostrils at bedtime.    . folic acid-vitamin b complex-vitamin c-selenium-zinc (DIALYVITE) 3 MG TABS tablet Take 1 tablet by mouth daily.    Marland Kitchen guaiFENesin (MUCINEX) 600 MG 12 hr tablet Take 1 tablet (600 mg total) by mouth 2 (two) times daily. 20 tablet 0  . HYDROcodone-acetaminophen (NORCO/VICODIN) 5-325 MG tablet Take 1 tablet by mouth 2 (two) times daily as needed.    . insulin detemir (LEVEMIR) 100 UNIT/ML injection Inject 10 Units into the skin at bedtime.    . insulin glargine (LANTUS) 100 UNIT/ML injection Inject 0.08 mLs (8 Units total) into the skin at bedtime. 10 mL 11  . ipratropium-albuterol (DUONEB) 0.5-2.5 (3) MG/3ML SOLN Take 3 mLs by nebulization 4 (four) times daily as needed (for wheezing/shortness of breath).    . lidocaine-prilocaine (EMLA) cream Apply 1 application topically as needed. Apply to access site (left forearm) 1 hour prior to dialysis and cover with clear wrap. Mon, wed, fri 0500    . meclizine (ANTIVERT) 32 MG tablet Take 32 mg by mouth 3 (three) times daily as needed.    . megestrol (MEGACE) 40 MG tablet Take 40 mg by mouth daily.    . metolazone (ZAROXOLYN) 5 MG tablet Take 5 mg by mouth 3 (three) times a week.    . midodrine (PROAMATINE) 10 MG tablet Take 1 tablet (10 mg total) by mouth 3 (three) times daily with meals. (Patient taking differently: Take 10  mg by mouth 3 (three) times daily with meals. Sun, tue, thu, sat - 0900,1800,2100) 90 tablet 2  . ondansetron (ZOFRAN) 4 MG tablet Take 4 mg by mouth every 8 (eight) hours as needed for nausea or vomiting.    . pantoprazole (PROTONIX) 40 MG tablet Take 40 mg by mouth daily.    . promethazine (PHENERGAN) 25 MG tablet Take 25 mg by mouth every 6 (six) hours as needed for nausea or vomiting.     . propranolol (INDERAL) 20 MG tablet Take 1 tablet (20 mg total) by mouth 2 (two) times daily. (Patient taking  differently: Take 40 mg by mouth daily. Takes an additional dose of 40 mg prn) 60 tablet 2  . QUEtiapine (SEROQUEL) 25 MG tablet Take 1 tablet (25 mg total) by mouth at bedtime. 30 tablet 2  . RENVELA 800 MG tablet Take 800-1,600 mg by mouth 5 (five) times daily. Pt takes two tablets three times daily with meals and one tablet two times daily with snacks.    . senna (SENOKOT) 8.6 MG TABS tablet Take 2 tablets (17.2 mg total) by mouth daily as needed for mild constipation. 30 each 0  . temazepam (RESTORIL) 15 MG capsule Take 15 mg by mouth at bedtime as needed for sleep.    . traMADol (ULTRAM) 50 MG tablet Take 1 tablet (50 mg total) by mouth every 6 (six) hours as needed for moderate pain or severe pain. 30 tablet 0  . apixaban (ELIQUIS) 5 MG TABS tablet Take 1 tablet (5 mg total) by mouth 2 (two) times daily. 60 tablet 1   No current facility-administered medications on file prior to visit.     There are no Patient Instructions on file for this visit. No Follow-up on file.   Brycen Bean A Vonette Grosso, PA-C

## 2017-02-24 ENCOUNTER — Encounter (INDEPENDENT_AMBULATORY_CARE_PROVIDER_SITE_OTHER): Payer: Self-pay

## 2017-02-24 NOTE — Telephone Encounter (Signed)
Spoke with patient's husband yesterday and the surgery will be scheduled for 03/10/17.

## 2017-02-25 ENCOUNTER — Other Ambulatory Visit (INDEPENDENT_AMBULATORY_CARE_PROVIDER_SITE_OTHER): Payer: Self-pay | Admitting: Vascular Surgery

## 2017-02-27 LAB — AEROBIC CULTURE  (SUPERFICIAL SPECIMEN)

## 2017-02-27 LAB — AEROBIC CULTURE W GRAM STAIN (SUPERFICIAL SPECIMEN)

## 2017-03-01 ENCOUNTER — Encounter: Payer: Medicare Other | Admitting: Internal Medicine

## 2017-03-01 DIAGNOSIS — E11621 Type 2 diabetes mellitus with foot ulcer: Secondary | ICD-10-CM | POA: Diagnosis not present

## 2017-03-02 NOTE — Progress Notes (Addendum)
[name], [name] ([id number]) Visit Report for 03/01/2017 Chief Complaint Document Details Patient Name: [name], [name] Date of Service: 03/01/2017 1:30 PM Medical Record Patient Account Number: [id number] Number: Treating RN: Ahmed Prima 1934-10-10 (83 y.o. Other Clinician: Date of Birth/Sex: Female) Treating ROBSON, MICHAEL Primary Care Provider: Emily Filbert Provider/Extender: G Referring Provider: Melina Modena in Treatment: 8 Information Obtained from: Patient Chief Complaint 01/04/17; patient is here for review of wounds on her bilateral lower extremities referred from her primary physician Dr. Emily Filbert [facility] Electronic Signature(s) Signed: 03/01/2017 4:28:03 PM By: Linton Ham MD Entered By: Linton Ham on 03/01/2017 14:46:37 [name] ([id number]) -------------------------------------------------------------------------------- Debridement Details Patient Name: [name] Date of Service: 03/01/2017 1:30 PM Medical Record Patient Account Number: [id number] Number: Treating RN: Cornell Barman Feb 04, 1934 (83 y.o. Other Clinician: Date of Birth/Sex: Female) Treating ROBSON, MICHAEL Primary Care Provider: Emily Filbert Provider/Extender: G Referring Provider: Melina Modena in Treatment: 8 Debridement Performed for Wound #2 Right,Dorsal Foot Assessment: Performed By: Physician Ricard Dillon, MD Debridement: Debridement Pre-procedure Yes - 14:08 Verification/Time Out Taken: Start Time: 14:08 Pain Control: Lidocaine 4% Topical Solution Level: Skin/Subcutaneous Tissue/Muscle Total Area Debrided ([name] x 5.3 (cm) x 2.5 (cm) = 13.25 (cm) W): Tissue and other Non-Viable, Eschar, Fibrin/Slough, Subcutaneous, Tendon material debrided: Instrument: Blade, Forceps Bleeding: Moderate Hemostasis Achieved: Silver Nitrate End Time: 14:12 Procedural Pain: 0 Post Procedural Pain: 0 Response to Treatment: Procedure was  tolerated well Post Debridement Measurements of Total Wound Length: (cm) 5.3 Width: (cm) 2.5 Depth: (cm) 0.3 Volume: (cm) 3.122 Character of Wound/Ulcer Post Improved Debridement: Severity of Tissue Post Debridement: Other severity specified Post Procedure Diagnosis Same as Pre-procedure Electronic Signature(s) Signed: 04/06/2017 7:40:19 AM By: Linton Ham MD Signed: 04/11/2017 10:33:24 AM By: Gretta Cool RN, BSN, Kim RN, BSN [address], [name] ([id number]) Previous Signature: 03/31/2017 11:54:06 AM Version By: Gretta Cool RN, BSN, Kim RN, BSN Previous Signature: 03/30/2017 8:40:59 AM Version By: Linton Ham MD Previous Signature: 03/01/2017 4:28:03 PM Version By: Linton Ham MD Previous Signature: 03/01/2017 4:55:45 PM Version By: Alric Quan Entered By: Gretta Cool RN, BSN, Kim on 04/04/2017 11:09:30 [name], [name] ([id number]) -------------------------------------------------------------------------------- HPI Details Patient Name: [name] Date of Service: 03/01/2017 1:30 PM Medical Record Patient Account Number: [id number] Number: Treating RN: Ahmed Prima December 22, 1933 (83 y.o. Other Clinician: Date of Birth/Sex: Female) Treating ROBSON, MICHAEL Primary Care Provider: Emily Filbert Provider/Extender: G Referring Provider: Melina Modena in Treatment: 8 History of Present Illness HPI Description: 01/04/17; this is a 83 year old fairly disabled woman who comes accompanied by her husband today. She is here for review of wounds on her bilateral feet and right calf. There is a hopeful note from Dr. Sabra Heck that is included. The patient is a type II diabetic with end-stage renal failure on dialysis, she is here for bilateral wounds in her feet and heels as well as the right calf wound posteriorly. The history here is a bit difficult to follow. Her husband states that she had a fracture of her leg in October. Indeed looking through [facility] she had a closed  nondisplaced fracture of the lateral condyle of the right femur. This was managed nonoperatively with a knee brace. Her husband thinks that the brace itself caused the wounds on the right leg although that would not of caused ones on the left. Apparently the wounds have been there for 2 months and the patient is being followed by [facility] with bilateral Unna boots. She  is also followed in Dr. Ozella Almond office of vascular surgery. She had recent arterial studies that showed biphasic waveforms bilaterally with a ABI on the right of 1.01 on the left at 0.93. T ABIs on the right at 0.72 and on the left at 0.75. He was not felt to have significant arterial disease. I am also not clear what [facility] was primarily dressing the wounds with under the wraps the patient had an admission on 10/17 with an acute right leg DVT status post IVC filter placement and was placed on Eliquis for 6 weeks only. 01/11/17; the x-rays that I ordered last week on this patient have been completed the left is the problem area. She is had a previous distal tib-fib fracture which appears to be better than the last imaging view I can find in September. She appears to have osteomyelitis of the lateral aspect of the talus. Left foot showed bone resorption and loss of cortical Along the medial margin of the navicular and medial cuneiform consistent with on the osteomyelitis. rRght foot and ankle showed diffuse soft tissue swelling without obvious focal bone destruction there was a suggestion of a right fourth metatarsal fracture. boen I actually tried to get more history out of the patient and her husband who is present I've also spoken to her son on the phone. It would appear that the deep area on the lateral aspect of her right knee was abrasive injury also the area on the lateral aspect just above the ankle. The she also probably consistent with pressure ulcers and dorsal ankle extensive wounds probably wrap injuries  although I am guessing about some of this. She does not have an arterial issue per Dr. dew. Her left tib-fib fracture was apparently in April as well she had a left femur fracture sometime in 2014 2015. She has a rod in her left femur hopefully we can get her through an MRI. I looked through [facility] back to 2009 I don't actually see an x-ray of the left femur. 01/18/17; her MRI is booked for February 13. We had some trouble with home health and the dressings that were prescribed so they left the current wrap on all week. There is increasing erythema on the medial aspect of the right foot compatible with cellulitis she will need an antibiotic AASIA, PEAVLER ([id number]) 01/25/17; patient's MRI is actually booked for February 15 not used Dragon. This is a patient that has multiple bilateral lower extremity wounds. I have never been completely certain how these happened and in fact they may be multifactorial. I have suspected that the area on her right lateral upper leg, right lateral lower leg were probably brace injuries. She has 2 large wounds with exposed tendon on her dorsal feet bilaterally. I suspect these were wrap injuries. She has pressure ulcers on her bilateral heels. She does not have significant arterial insufficiency and is previously been seen by Dr.Dew. A plain x-ray of her left foot suggested osteomyelitis which is the reason for the MRI. I gave her doxycycline last week for left foot cellulitis which looks better. 02/01/17; her MRI of the left foot showed diffuse cellulitis and mild fasciitis without drainable soft tissue abscess or pyomyositis. She does however have osteomyelitis involving the medial navicular bone, proximal aspect of the medial cuneiform medially and also possible involvement of the distal medial aspect of the talus. The left foot continues to have a large wound on the anterior foot with exposed tendon and a unstageable wound on  the right heel covered in a  thick black eschar. Extensive discussion with the patient today. Probably the better option is a left BKA. She is nonambulatory. With regards to her right foot and leg the wounds are stable. There is necrotic tendon in the right anterior dorsal foot, exposed tendon in the right Achilles area and a very deep wound with exposed tendon on the lateral aspect of her right knee. Nevertheless all of these wounds looks somewhat viable to me. I did not MRI of the right leg as the x-ray did not show evidence of osteomyelitis 02/08/17; with regards to her right foot including the dorsal foot and the right heel this is about the same. MRI shows underlying osteomyelitis again an extensive discussion about amputation the patient still seems ambivalent. The areas over the left dorsal foot left medial ankle and the deep wound over the right lateral knee are stable to improved. Given her reasonably normal vascularity I think this foot and limb or potentially salvageable. X-ray of her foot on the right did not show osteomyelitis. I have urged her to consider a left BKA 02/15/17 patient returns today. Saw Dr. dew this morning. Apparently the understanding that the patient has is that there is no urgency to the amputation. While this is technically true the patient has underlying osteomyelitis with extensive tissue loss in her left foot. I have not actually talked to Dr. Lucky Cowboy or seen his note and I'll wait to look at this and call him if necessary. Meantime there is no major change to the wounds which in this case actually is probably a good thing since there is high risk of deterioration in this nonambulatory woman 02/22/17; I spoke to Dr. dew last week. We agreed that that she would need a left BKA. He is concerned about the nonhealing risk etc. Nevertheless there is too much tissue damage between the left heel wound, left dorsal foot wound, and the underlying osteomyelitis. She arrives today short of breath and  wheezing. 03/01/17; patient is scheduled for a left BKA on 03/10/17; this is related to underlying osteomyelitis in her foot, severe wounds on the left dorsal foot, and large necrotic wound over the left heel covered with an eschar. There was some purulent drainage the came out last week from the left heel. This cultured a very drug- resistant MRSA and enterococcus. Wouldn't have been a good combination to try and address orally in this penicillin allergic patient. Fortunately she does not appear to have any symptoms in this area there is no drainage today. I didn't really see the need for any additional antibiotics. Problematically she has now an ulcer with a necrotic surface in her right cheek area. I had noticed this a week or 2 ago, her husband gave me the explanation of the CPAP mask irritation and it seemed reasonable at the time. This is now looking much worse Electronic Signature(s) Signed: 03/01/2017 4:28:03 PM By: Linton Ham MD Entered By: Linton Ham on 03/01/2017 14:49:50 [name] ([id number]) [name], [name] ([id number]) -------------------------------------------------------------------------------- Physical Exam Details Patient Name: [name] Date of Service: 03/01/2017 1:30 PM Medical Record Patient Account Number: [id number] Number: Treating RN: Ahmed Prima 17-Nov-1934 (83 y.o. Other Clinician: Date of Birth/Sex: Female) Treating ROBSON, MICHAEL Primary Care Provider: Emily Filbert Provider/Extender: G Referring Provider: Melina Modena in Treatment: 8 Constitutional Sitting or standing Blood Pressure is within target range for patient.. Pulse regular and within target range for patient.Marland Kitchen Respirations regular, non-labored and within target  range.. Temperature is normal and within the target range for the patient.. Patient's appearance is neat and clean. Appears in no acute distress. Well nourished and well developed.. Eyes Conjunctivae  clear. No discharge.. Integumentary (Hair, Skin) The area on her right cheek is a bit concerning. It is a small punched-out looking area with a necrotic black surface. More problematically than that there appears to be subcutaneous induration. Notes Wound exam; the area on the left dorsal foot/ankle still has necrotic tendon and a large black eschar on the left heel. This foot has underlying osteomyelitis and is scheduled for amputation on 03/10/17. I didn't think any further antibiotics here were indicated, in spite of the difficult culture from last week the left heel does not look infected oI started to remove tendon with pickups and scalpels from the right dorsal foot wound. This is nonviable tendon and will need to be removed in this nonambulatory woman. oOn the left medial heel there is also a wound with tendon present this will also need to be removed in order to heal these wounds. oOn the left lateral knee still a very deep wound although I think it is contracted somewhat. Electronic Signature(s) Signed: 03/01/2017 4:28:03 PM By: Linton Ham MD Entered By: Linton Ham on 03/01/2017 14:52:30 [name] ([id number]) -------------------------------------------------------------------------------- Physician Orders Details Patient Name: [name] Date of Service: 03/01/2017 1:30 PM Medical Record Patient Account Number: [id number] Number: Treating RN: Ahmed Prima 12-20-1933 (83 y.o. Other Clinician: Date of Birth/Sex: Female) Treating ROBSON, MICHAEL Primary Care Provider: Emily Filbert Provider/Extender: G Referring Provider: Melina Modena in Treatment: 8 Verbal / Phone Orders: No Diagnosis Coding Wound Cleansing Wound #1 Left,Dorsal Foot o Clean wound with Normal Saline. o Cleanse wound with mild soap and water Wound #2 Right,Dorsal Foot o Clean wound with Normal Saline. o Cleanse wound with mild soap and water Wound #3 Left  Calcaneus o Clean wound with Normal Saline. o Cleanse wound with mild soap and water Wound #4 Right Calcaneus o Clean wound with Normal Saline. o Cleanse wound with mild soap and water Wound #6 Right,Lateral,Posterior Lower Leg o Clean wound with Normal Saline. o Cleanse wound with mild soap and water Wound #7 Right,Lateral Malleolus o Clean wound with Normal Saline. o Cleanse wound with mild soap and water Wound #9 Left,Lateral Metatarsal head first o Clean wound with Normal Saline. o Cleanse wound with mild soap and water Anesthetic Wound #1 Left,Dorsal Foot o Topical Lidocaine 4% cream applied to wound bed prior to debridement - for clinic use only Wound #2 Right,Dorsal Foot o Topical Lidocaine 4% cream applied to wound bed prior to debridement - for clinic use only [name], Connie R. ([id number]) Wound #3 Left Calcaneus o Topical Lidocaine 4% cream applied to wound bed prior to debridement - for clinic use only Wound #4 Right Calcaneus o Topical Lidocaine 4% cream applied to wound bed prior to debridement - for clinic use only Wound #6 Right,Lateral,Posterior Lower Leg o Topical Lidocaine 4% cream applied to wound bed prior to debridement - for clinic use only Wound #7 Right,Lateral Malleolus o Topical Lidocaine 4% cream applied to wound bed prior to debridement - for clinic use only Wound #9 Left,Lateral Metatarsal head first o Topical Lidocaine 4% cream applied to wound bed prior to debridement - for clinic use only Skin Barriers/Peri-Wound Care Wound #6 Right,Lateral,Posterior Lower Leg o Skin Prep Primary Wound Dressing Wound #1 Left,Dorsal Foot o Iodoflex Wound #2 Right,Dorsal Foot o Iodoflex Wound #3 Left Calcaneus   o Medihoney gel Wound #4 Right Calcaneus o Prisma Ag - moisten with saline Wound #6 Right,Lateral,Posterior Lower Leg o Prisma Ag - moisten with saline Wound #7 Right,Lateral Malleolus o Prisma Ag - moisten  with saline Wound #9 Left,Lateral Metatarsal head first o Prisma Ag - moisten with saline Secondary Dressing Wound #1 Left,Dorsal Foot o ABD pad o Dry Gauze Wound #2 Right,Dorsal Foot Zaragoza, Keslie R. ([id number]) o ABD pad o Dry Gauze Wound #3 Left Calcaneus o Dry Gauze o Other - heel cup Wound #4 Right Calcaneus o Dry Gauze o Other - heel cup Wound #6 Right,Lateral,Posterior Lower Leg o Dry Gauze o Boardered Foam Dressing Wound #7 Right,Lateral Malleolus o ABD pad o Dry Gauze Wound #9 Left,Lateral Metatarsal head first o ABD pad o Dry Gauze Dressing Change Frequency Wound #1 Left,Dorsal Foot o Three times weekly - pt will have this changed on Tuesdays in the [facility] Wound #2 Right,Dorsal Foot o Three times weekly - pt will have this changed on Tuesdays in the [facility] Wound #3 Left Calcaneus o Three times weekly - pt will have this changed on Tuesdays in the [facility] Wound #4 Right Calcaneus o Three times weekly - pt will have this changed on Tuesdays in the [facility] Wound #6 Right,Lateral,Posterior Lower Leg o Three times weekly - pt will have this changed on Tuesdays in the [facility] Wound #7 Right,Lateral Malleolus o Three times weekly - pt will have this changed on Tuesdays in the [facility] Wound #9 Left,Lateral Metatarsal head first o Three times weekly - pt will have this changed on Tuesdays in the [facility] Follow-up Appointments Wound #1 Left,Dorsal Foot o Return Appointment in 1 week. [name], Connie R. ([id number]) Wound #2 Right,Dorsal Foot o Return Appointment in 1 week. Wound #3 Left Calcaneus o Return Appointment in 1 week. Wound #4 Right Calcaneus o Return Appointment in 1 week. Wound #6 Right,Lateral,Posterior Lower Leg o Return Appointment in 1 week. Wound #7 Right,Lateral Malleolus o Return Appointment in 1 week. Wound #9 Left,Lateral  Metatarsal head first o Return Appointment in 1 week. Edema Control Wound #1 Left,Dorsal Foot o 3 Layer Compression System - Bilateral o Elevate legs to the level of the heart and pump ankles as often as possible Wound #2 Right,Dorsal Foot o 3 Layer Compression System - Bilateral o Elevate legs to the level of the heart and pump ankles as often as possible Wound #3 Left Calcaneus o 3 Layer Compression System - Bilateral o Elevate legs to the level of the heart and pump ankles as often as possible Wound #4 Right Calcaneus o 3 Layer Compression System - Bilateral o Elevate legs to the level of the heart and pump ankles as often as possible Wound #6 Right,Lateral,Posterior Lower Leg o 3 Layer Compression System - Bilateral o Elevate legs to the level of the heart and pump ankles as often as possible Wound #7 Right,Lateral Malleolus o 3 Layer Compression System - Bilateral o Elevate legs to the level of the heart and pump ankles as often as possible Off-Loading Wound #1 Left,Dorsal Foot o Turn and reposition every 2 hours o Other: - float heels above heart level while in the bed Moor, Yvanna R. ([id number]) Wound #2 Right,Dorsal Foot o Turn and reposition every 2 hours o Other: - float heels above heart level while in the bed Wound #3 Left Calcaneus o Turn and reposition every 2 hours o Other: - float heels  above heart level while in the bed Wound #4 Right Calcaneus o Turn and reposition every 2 hours o Other: - float heels above heart level while in the bed Wound #6 Right,Lateral,Posterior Lower Leg o Turn and reposition every 2 hours o Other: - float heels above heart level while in the bed Wound #7 Right,Lateral Malleolus o Turn and reposition every 2 hours o Other: - float heels above heart level while in the bed Additional Orders / Instructions Wound #1 Left,Dorsal Foot o Increase protein intake. Wound #2 Right,Dorsal  Foot o Increase protein intake. Wound #3 Left Calcaneus o Increase protein intake. Wound #4 Right Calcaneus o Increase protein intake. Wound #6 Right,Lateral,Posterior Lower Leg o Increase protein intake. Wound #7 Right,Lateral Malleolus o Increase protein intake. Home Health Wound #1 [name] [place] Visits o Home Health Nurse may visit PRN to address patientos wound care needs. o FACE TO FACE ENCOUNTER: MEDICARE and MEDICAID PATIENTS: I certify that this patient is under my care and that I had a face-to-face encounter that meets the physician face-to-face encounter requirements with this patient on this date. The encounter with the patient was in whole or in part for the following MEDICAL CONDITION: (primary reason for [place]) MEDICAL NECESSITY: I certify, that based on my findings, NURSING services are a medically [name], [name]. ([id number]) necessary home health service. HOME BOUND STATUS: I certify that my clinical findings support that this patient is homebound (i.e., Due to illness or injury, pt requires aid of supportive devices such as crutches, cane, wheelchairs, walkers, the use of special transportation or the assistance of another person to leave their place of residence. There is a normal inability to leave the home and doing so requires considerable and taxing effort. Other absences are for medical reasons / religious services and are infrequent or of short duration when for other reasons). o If current dressing causes regression in wound condition, may D/C ordered dressing product/s and apply Normal Saline Moist Dressing daily until next [place] / Other MD appointment. [place] of regression in wound condition at [phone]. o Please direct any NON-WOUND related issues/requests for orders to patient's Primary Care Physician Wound #2 [place] Nurse may visit PRN to address patientos wound care needs. o FACE TO FACE ENCOUNTER: MEDICARE and MEDICAID PATIENTS: I certify that this patient is under my care and that I had a face-to-face encounter that meets the physician face-to-face encounter requirements with this patient on this date. The encounter with the patient was in whole or in part for the following MEDICAL CONDITION: (primary reason for [place]) MEDICAL NECESSITY: I certify, that based on my findings, NURSING services are a medically necessary home health service. HOME BOUND STATUS: I certify that my clinical findings support that this patient is homebound (i.e., Due to illness or injury, pt requires aid of supportive devices such as crutches, cane, wheelchairs, walkers, the use of special transportation or the assistance of another person to leave their place of residence. There is a normal inability to leave the home and doing so requires considerable and taxing effort. Other absences are for medical reasons / religious services and are infrequent or of short duration when for other reasons). o If current dressing causes regression in wound condition, may D/C ordered dressing product/s and apply Normal Saline Moist Dressing daily until next [place] / Other MD appointment. [place] of  regression in wound condition at [phone]. o Please direct any NON-WOUND related issues/requests for orders to patient's Primary Care Physician Wound #3 Left [place] Nurse may visit PRN to address patientos wound care needs. o FACE TO FACE ENCOUNTER: MEDICARE and MEDICAID PATIENTS: I certify that this patient is under my care and that I had a face-to-face encounter that meets the physician face-to-face encounter requirements with this patient on this date. The encounter with the patient was in whole or in part for the  following MEDICAL CONDITION: (primary reason for [place]) MEDICAL NECESSITY: I certify, that based on my findings, NURSING services are a medically necessary home health service. HOME BOUND STATUS: I certify that my clinical findings support that this patient is homebound (i.e., Due to illness or injury, pt requires aid of supportive devices such as crutches, cane, wheelchairs, walkers, the use of special transportation or the assistance of another person to leave their place of residence. There is a normal inability to leave the home and doing so requires considerable and taxing effort. Other absences are for medical reasons / religious services and are infrequent or of short duration when for other reasons). MADELEIN, Connie R. ([id number]) o If current dressing causes regression in wound condition, may D/C ordered dressing product/s and apply Normal Saline Moist Dressing daily until next [place] / Other MD appointment. [place] of regression in wound condition at [phone]. o Please direct any NON-WOUND related issues/requests for orders to patient's Primary Care Physician Wound #4 Right [place] Nurse may visit PRN to address patientos wound care needs. o FACE TO FACE ENCOUNTER: MEDICARE and MEDICAID PATIENTS: I certify that this patient is under my care and that I had a face-to-face encounter that meets the physician face-to-face encounter requirements with this patient on this date. The encounter with the patient was in whole or in part for the following MEDICAL CONDITION: (primary reason for [place]) MEDICAL NECESSITY: I certify, that based on my findings, NURSING services are a medically necessary home health service. HOME BOUND STATUS: I certify that my clinical findings support that this patient is homebound (i.e., Due to illness or injury, pt requires aid of supportive devices such  as crutches, cane, wheelchairs, walkers, the use of special transportation or the assistance of another person to leave their place of residence. There is a normal inability to leave the home and doing so requires considerable and taxing effort. Other absences are for medical reasons / religious services and are infrequent or of short duration when for other reasons). o If current dressing causes regression in wound condition, may D/C ordered dressing product/s and apply Normal Saline Moist Dressing daily until next [place] / Other MD appointment. [place] of regression in wound condition at [phone]. o Please direct any NON-WOUND related issues/requests for orders to patient's Primary Care Physician Wound #6 [place] Nurse may visit PRN to address patientos wound care needs. o FACE TO FACE ENCOUNTER: MEDICARE and MEDICAID PATIENTS: I certify that this patient is under my care and that I had a face-to-face encounter that meets the physician face-to-face encounter requirements with this patient on this date. The encounter with the patient was in whole or in part for the following MEDICAL CONDITION: (primary reason for [place]) MEDICAL NECESSITY: I certify, that based on my findings, NURSING  services are a medically necessary home health service. HOME BOUND STATUS: I certify that my clinical findings support that this patient is homebound (i.e., Due to illness or injury, pt requires aid of supportive devices such as crutches, cane, wheelchairs, walkers, the use of special transportation or the assistance of another person to leave their place of residence. There is a normal inability to leave the home and doing so requires considerable and taxing effort. Other absences are for medical reasons / religious services and are infrequent or of short duration when for other  reasons). o If current dressing causes regression in wound condition, may D/C ordered dressing product/s and apply Normal Saline Moist Dressing daily until next [place] / Other MD appointment. [place] of regression in wound condition at [phone]. o Please direct any NON-WOUND related issues/requests for orders to patient's Primary Care Physician Wound #7 [place] Visits [name], [name] ([phone] Nurse may visit PRN to address patientos wound care needs. o FACE TO FACE ENCOUNTER: MEDICARE and MEDICAID PATIENTS: I certify that this patient is under my care and that I had a face-to-face encounter that meets the physician face-to-face encounter requirements with this patient on this date. The encounter with the patient was in whole or in part for the following MEDICAL CONDITION: (primary reason for [place]) MEDICAL NECESSITY: I certify, that based on my findings, NURSING services are a medically necessary home health service. HOME BOUND STATUS: I certify that my clinical findings support that this patient is homebound (i.e., Due to illness or injury, pt requires aid of supportive devices such as crutches, cane, wheelchairs, walkers, the use of special transportation or the assistance of another person to leave their place of residence. There is a normal inability to leave the home and doing so requires considerable and taxing effort. Other absences are for medical reasons / religious services and are infrequent or of short duration when for other reasons). o If current dressing causes regression in wound condition, may D/C ordered dressing product/s and apply Normal Saline Moist Dressing daily until next [place] / Other MD appointment. [place] of regression in wound condition at [phone]. o Please direct any NON-WOUND related issues/requests for  orders to patient's Primary Care Physician Wound #9 Left,Lateral Metatarsal head first o [place] Nurse may visit PRN to address patientos wound care needs. o FACE TO FACE ENCOUNTER: MEDICARE and MEDICAID PATIENTS: I certify that this patient is under my care and that I had a face-to-face encounter that meets the physician face-to-face encounter requirements with this patient on this date. The encounter with the patient was in whole or in part for the following MEDICAL CONDITION: (primary reason for [place]) MEDICAL NECESSITY: I certify, that based on my findings, NURSING services are a medically necessary home health service. HOME BOUND STATUS: I certify that my clinical findings support that this patient is homebound (i.e., Due to illness or injury, pt requires aid of supportive devices such as crutches, cane, wheelchairs, walkers, the use of special transportation or the assistance of another person to leave their place of residence. There is a normal inability to leave the home and doing so requires considerable and taxing effort. Other absences are for medical reasons / religious services and are infrequent or of short duration when for other reasons). o If current dressing causes regression in wound condition, may D/C ordered dressing product/s and  apply Normal Saline Moist Dressing daily until next [place] / Other MD appointment. [place] of regression in wound condition at [phone]. o Please direct any NON-WOUND related issues/requests for orders to patient's Primary Care Physician Medications-please add to medication list. Wound #1 Left,Dorsal Foot o Other: - Vitamin C, Zinc, MVI Wound #2 Right,Dorsal Foot o Other: - Vitamin C, Zinc, MVI Wound #3 Left Calcaneus o Other: - Vitamin C, Zinc, MVI Lemaire, Emmajane R. ([id number]) Wound #4 Right Calcaneus o Other: - Vitamin C, Zinc, MVI Wound  #6 Right,Lateral,Posterior Lower Leg o Other: - Vitamin C, Zinc, MVI Wound #7 Right,Lateral Malleolus o Other: - Vitamin C, Zinc, MVI Wound #9 Left,Lateral Metatarsal head first o Other: - Vitamin C, Zinc, MVI Electronic Signature(s) Signed: 03/01/2017 4:28:03 PM By: Linton Ham MD Signed: 03/01/2017 4:55:45 PM By: Alric Quan Entered By: Alric Quan on 03/01/2017 14:17:33 [name] ([id number]) -------------------------------------------------------------------------------- Problem List Details Patient Name: [name] Date of Service: 03/01/2017 1:30 PM Medical Record Patient Account Number: [id number] Number: Treating RN: Ahmed Prima 07-May-1934 (83 y.o. Other Clinician: Date of Birth/Sex: Female) Treating ROBSON, MICHAEL Primary Care Provider: Emily Filbert Provider/Extender: G Referring Provider: Melina Modena in Treatment: 8 Active Problems ICD-10 Encounter Code Description Active Date Diagnosis E11.621 Type 2 diabetes mellitus with foot ulcer 01/04/2017 Yes L97.525 Non-pressure chronic ulcer of other part of left foot with 01/04/2017 Yes muscle involvement without evidence of necrosis L97.515 Non-pressure chronic ulcer of other part of right foot with 01/04/2017 Yes muscle involvement without evidence of necrosis L89.613 Pressure ulcer of right heel, stage 3 01/04/2017 Yes L89.620 Pressure ulcer of left heel, unstageable 01/04/2017 Yes L97.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 Other Clinician: Date of Birth/Sex: Female) Treating ROBSON, MICHAEL Primary Care Provider: Emily Filbert Provider/Extender: G Referring Provider: Melina Modena in Treatment: 8 Subjective Chief Complaint Information obtained from Patient 01/04/17; patient is here for review of wounds on her bilateral lower extremities referred from her primary physician Dr. Emily Filbert [facility] History of Present Illness (HPI) 01/04/17; this is a 83 year old fairly disabled woman who comes accompanied by her husband today. She is here for review of wounds on her bilateral feet and right calf. There is a hopeful note from Dr. Sabra Heck that is included. The patient is a type II diabetic with end-stage renal failure on dialysis, she is here for bilateral wounds in her feet and heels as well as the right calf wound posteriorly. The history here is a bit difficult to follow. Her husband states that she had a fracture of her leg in October. Indeed looking [facility] she had a closed nondisplaced fracture of the lateral condyle of the right femur. This was managed nonoperatively with a knee brace. Her husband thinks that the brace itself caused the wounds on the right leg although that would not of caused ones on the left. Apparently the wounds have been there for 2 months and the patient is being followed by [facility] with bilateral Unna boots. She  is also followed in Dr. Ozella Almond office of vascular surgery. She had recent arterial studies that showed biphasic waveforms bilaterally with a ABI on the right of 1.01 on the left at 0.93. T ABIs on the right at 0.72 and on the left at 0.75. He was not felt to have significant arterial disease. I am also not clear what [facility] was primarily dressing the wounds  with under the wraps the patient had an admission on 10/17 with an acute right leg DVT status post IVC filter placement and was placed on Eliquis for 6 weeks only. 01/11/17; the x-rays that I ordered last week on this patient have been completed the left is the problem area. She is had a previous distal tib-fib fracture which appears to be better than the last imaging view I can find in September. She appears to have osteomyelitis of the lateral aspect of the talus. Left foot showed bone resorption and loss of cortical Along the medial margin of the navicular and medial cuneiform consistent with on the osteomyelitis. rRght foot and ankle showed diffuse soft tissue swelling without obvious focal bone destruction there was a suggestion of a right fourth metatarsal fracture. boen I actually tried to get more history out of the patient and her husband who is present I've also spoken to her son on the phone. It would appear that the deep area on the lateral aspect of her right knee was abrasive injury also the area on the lateral aspect just above the ankle. The she also probably consistent with pressure ulcers and dorsal ankle extensive wounds probably wrap injuries although I am guessing about some of this. She does not have an arterial issue per Dr. dew. Her left tib-fib fracture was apparently in April Kathan, [place] ([id number]) as well she had a left femur fracture sometime in 2014 2015. She has a rod in her left femur hopefully we can get her through an MRI. I looked [facility] back to 2009 I don't actually see an x-ray of the left femur. 01/18/17; her MRI is booked for February 13. We had some trouble with home health and the dressings that were prescribed so they left the current wrap on all week. There is increasing erythema on the medial aspect of the right foot compatible with cellulitis she will need an antibiotic 01/25/17; patient's MRI is actually booked for February 15 not  used [facility]. This is a patient that has multiple bilateral lower extremity wounds. I have never been completely certain how these happened and in fact they may be multifactorial. I have suspected that the area on her right lateral upper leg, right lateral lower leg were probably brace injuries. She has 2 large wounds with exposed tendon on her dorsal feet bilaterally. I suspect these were wrap injuries. She has pressure ulcers on her bilateral heels. She does not have significant arterial insufficiency and is previously been seen by Dr.Dew. A plain x-ray of her left foot suggested osteomyelitis which is the reason for the MRI. I gave her doxycycline last week for left foot cellulitis which looks better. 02/01/17; her MRI of the left foot showed diffuse cellulitis and mild fasciitis without drainable soft tissue abscess or pyomyositis. She does however have osteomyelitis involving the medial navicular bone, proximal aspect of the medial cuneiform medially and also possible involvement of the distal medial aspect of the talus. The left foot continues to have a large wound on the anterior foot with exposed tendon and a unstageable wound  on the right heel covered in a thick black eschar. Extensive discussion with the patient today. Probably the better option is a left BKA. She is nonambulatory. With regards to her right foot and leg the wounds are stable. There is necrotic tendon in the right anterior dorsal foot, exposed tendon in the right Achilles area and a very deep wound with exposed tendon on the lateral aspect of her right knee. Nevertheless all of these wounds looks somewhat viable to me. I did not MRI of the right leg as the x-ray did not show evidence of osteomyelitis 02/08/17; with regards to her right foot including the dorsal foot and the right heel this is about the same. MRI shows underlying osteomyelitis again an extensive discussion about amputation the patient still seems ambivalent.  The areas over the left dorsal foot left medial ankle and the deep wound over the right lateral knee are stable to improved. Given her reasonably normal vascularity I think this foot and limb or potentially salvageable. X-ray of her foot on the right did not show osteomyelitis. I have urged her to consider a left BKA 02/15/17 patient returns today. Saw Dr. dew this morning. Apparently the understanding that the patient has is that there is no urgency to the amputation. While this is technically true the patient has underlying osteomyelitis with extensive tissue loss in her left foot. I have not actually talked to Dr. Lucky Cowboy or seen his note and I'll wait to look at this and call him if necessary. Meantime there is no major change to the wounds which in this case actually is probably a good thing since there is high risk of deterioration in this nonambulatory woman 02/22/17; I spoke to Dr. dew last week. We agreed that that she would need a left BKA. He is concerned about the nonhealing risk etc. Nevertheless there is too much tissue damage between the left heel wound, left dorsal foot wound, and the underlying osteomyelitis. She arrives today short of breath and wheezing. 03/01/17; patient is scheduled for a left BKA on 03/10/17; this is related to underlying osteomyelitis in her foot, severe wounds on the left dorsal foot, and large necrotic wound over the left heel covered with an eschar. There was some purulent drainage the came out last week from the left heel. This cultured a very drug- resistant MRSA and enterococcus. Wouldn't have been a good combination to try and address orally in this penicillin allergic patient. Fortunately she does not appear to have any symptoms in this area there is no drainage today. I didn't really see the need for any additional antibiotics. Problematically she has now an ulcer with a necrotic surface in her right cheek area. I had noticed this a week or 2 ago, her  husband gave me the explanation of the CPAP mask irritation and it seemed reasonable [place], Byron ([id number]) at the time. This is now looking much worse Objective Constitutional Sitting or standing Blood Pressure is within target range for patient.. Pulse regular and within target range for patient.Mar[place] Kitchen Respirations regular, non-labored and within target range.. Temperature is normal and within the target range for the patient.. Patient's appearance is neat and clean. Appears in no acute distress. Well nourished and well developed.. Vitals Time Taken: 1:37 PM, Height: 61 in, Weight: 206 lbs, BMI: 38.9, Temperature: 97.5 F, Pulse: 101 bpm, Respiratory Rate: 18 breaths/min, Blood Pressure: 94/47 mmHg. Eyes Conjunctivae clear. No discharge.. General Notes: Wound exam; the area on the left dorsal foot/ankle still has  necrotic tendon and a large black eschar on the left heel. This foot has underlying osteomyelitis and is scheduled for amputation on 03/10/17. I didn't think any further antibiotics here were indicated, in spite of the difficult culture from last week the left heel does not look infected I started to remove tendon with pickups and scalpels from the right dorsal foot wound. This is nonviable tendon and will need to be removed in this nonambulatory woman. On the left medial heel there is also a wound with tendon present this will also need to be removed in order to heal these wounds. On the left lateral knee still a very deep wound although I think it is contracted somewhat. Integumentary (Hair, Skin) The area on her right cheek is a bit concerning. It is a small punched-out looking area with a necrotic black surface. More problematically than that there appears to be subcutaneous induration. Wound #1 status is Open. Original cause of wound was Gradually Appeared. The wound is located on the Left,Dorsal Foot. The wound measures 3.8cm length x 4.7cm width x 0.4cm depth; 14.027cm^2  area and 5.611cm^3 volume. There is Fat Layer (Subcutaneous Tissue) Exposed exposed. There is no tunneling noted, however, there is undermining starting at 12:00 and ending at 1:00 with a maximum distance of 1.3cm. There is a large amount of serosanguineous drainage noted. The wound margin is well defined and not attached to the wound base. There is medium (34-66%) red, pink granulation within the wound bed. There is a medium (34-66%) amount of necrotic tissue within the wound bed including Eschar and Adherent Slough. The periwound skin appearance exhibited: Callus, Crepitus, Excoriation, Induration, Rash, Scarring, Dry/Scaly, Maceration, Atrophie Blanche, Cyanosis, Ecchymosis, Hemosiderin Staining, Mottled, Pallor, Rubor, Erythema. The surrounding wound skin color is noted with erythema which is circumferential. Periwound temperature was noted as No Abnormality. The periwound has tenderness on palpation. Wound #2 status is Open. Original cause of wound was Gradually Appeared. The wound is located on the Right,Dorsal Foot. The wound measures 5.3cm length x 2.5cm width x 0.3cm depth; 10.407cm^2 area and Maready, Leandria R. ([id number]) 3.122cm^3 volume. There is Fat Layer (Subcutaneous Tissue) Exposed exposed. There is no tunneling or undermining noted. There is a large amount of serosanguineous drainage noted. The wound margin is distinct with the outline attached to the wound base. There is small (1-33%) pink granulation within the wound bed. There is a large (67-100%) amount of necrotic tissue within the wound bed including Eschar and Adherent Slough. The periwound skin appearance exhibited: Maceration, Erythema. The surrounding wound skin color is noted with erythema which is circumferential. Periwound temperature was noted as No Abnormality. The periwound has tenderness on palpation. Wound #3 status is Open. Original cause of wound was Pressure Injury. The wound is located on the Left Calcaneus.  The wound measures 3.5cm length x 7.8cm width x 0.1cm depth; 21.441cm^2 area and 2.144cm^3 volume. There is no tunneling or undermining noted. There is a large amount of serous drainage noted. The wound margin is distinct with the outline attached to the wound base. There is no granulation within the wound bed. There is a large (67-100%) amount of necrotic tissue within the wound bed including Eschar. The periwound skin appearance exhibited: Erythema. The surrounding wound skin color is noted with erythema which is circumferential. Periwound temperature was noted as No Abnormality. The periwound has tenderness on palpation. Wound #4 status is Open. Original cause of wound was Pressure Injury. The wound is located on the Right Calcaneus. The wound  measures 2.5cm length x 1cm width x 0.2cm depth; 1.963cm^2 area and 0.393cm^3 volume. There is no tunneling or undermining noted. There is a large amount of serosanguineous drainage noted. The wound margin is distinct with the outline attached to the wound base. There is no granulation within the wound bed. There is a large (67-100%) amount of necrotic tissue within the wound bed including Eschar and Adherent Slough. The periwound skin appearance exhibited: Erythema. The surrounding wound skin color is noted with erythema which is circumferential. Periwound temperature was noted as No Abnormality. The periwound has tenderness on palpation. Wound #6 status is Open. Original cause of wound was Gradually Appeared. The wound is located on the Right,Lateral,Posterior Lower Leg. The wound measures 4.3cm length x 1.2cm width x 2cm depth; 4.053cm^2 area and 8.105cm^3 volume. There is no tunneling or undermining noted. There is a large amount of serosanguineous drainage noted. The wound margin is distinct with the outline attached to the wound base. There is large (67-100%) red, pink granulation within the wound bed. There is a small (1-33%) amount of necrotic  tissue within the wound bed including Adherent Slough. The periwound skin appearance exhibited: Maceration, Erythema. The surrounding wound skin color is noted with erythema which is circumferential. Periwound temperature was noted as No Abnormality. The periwound has tenderness on palpation. Wound #7 status is Open. Original cause of wound was Gradually Appeared. The wound is located on the Right,Lateral Malleolus. The wound measures 0.2cm length x 0.2cm width x 0.1cm depth; 0.031cm^2 area and 0.003cm^3 volume. There is no tunneling or undermining noted. There is a large amount of serous drainage noted. The wound margin is distinct with the outline attached to the wound base. There is large (67-100%) red granulation within the wound bed. There is no necrotic tissue within the wound bed. The periwound skin appearance did not exhibit: Callus, Crepitus, Excoriation, Induration, Rash, Scarring, Dry/Scaly, Maceration, Atrophie Blanche, Cyanosis, Ecchymosis, Hemosiderin Staining, Mottled, Pallor, Rubor, Erythema. Periwound temperature was noted as No Abnormality. Wound #9 status is Open. Original cause of wound was Gradually Appeared. The wound is located on the Left,Lateral Metatarsal head first. The wound measures 0.5cm length x 0.5cm width x 0.2cm depth; 0.196cm^2 area and 0.039cm^3 volume. There is no tunneling or undermining noted. There is a large amount of serosanguineous drainage noted. The wound margin is distinct with the outline attached to the wound base. There is no granulation within the wound bed. There is a large (67-100%) amount of necrotic tissue within the wound bed including Eschar. The periwound skin appearance exhibited: Maceration. The [place], Jalene R. ([id number]) periwound has tenderness on palpation. Assessment Active Problems ICD-10 E11.621 - Type 2 diabetes mellitus with foot ulcer L97.525 - 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  without evidence of necrosis L97.515 - Non-pressure chronic ulcer of other part of right foot with muscle involvement without evidence of necrosis L89.613 - Pressure ulcer of right heel, stage 3 L89.620 - Pressure ulcer of left heel, unstageable L97.213 - Non-pressure chronic ulcer of right calf with necrosis of muscle E11.42 - Type 2 diabetes mellitus with diabetic polyneuropathy Procedures Wound #2 Wound #2 is a Diabetic Wound/Ulcer of the Lower Extremity located on the Right,Dorsal Foot . There was a Skin/Subcutaneous Tissue/Muscle Debridement ([id number]) debridement with total area of 13.25 sq cm performed by Ricard Dillon, MD. with the following instrument(s): Blade and Forceps to remove Non-Viable tissue/material including Fibrin/Slough, Eschar, Tendon, and Subcutaneous after achieving pain control using Lidocaine 4%  Topical Solution. A time out was conducted at 14:08, prior to the start of the procedure. A Moderate amount of bleeding was controlled with Silver Nitrate. The procedure was tolerated well with a pain level of 0 throughout and a pain level of 0 following the procedure. Post Debridement Measurements: 5.3cm length x 2.5cm width x 0.3cm depth; 3.122cm^3 volume. Character of Wound/Ulcer Post Debridement is improved. Severity of Tissue Post Debridement is: Other severity specified. Post procedure Diagnosis Wound #2: Same as Pre-Procedure Plan Wound Cleansing: Wound #1 Left,Dorsal Foot: Perot, Chana R. ([id number]) Clean wound with Normal Saline. Cleanse wound with mild soap and water Wound #2 Right,Dorsal Foot: Clean wound with Normal Saline. Cleanse wound with mild soap and water Wound #3 Left Calcaneus: Clean wound with Normal Saline. Cleanse wound with mild soap and water Wound #4 Right Calcaneus: Clean wound with Normal Saline. Cleanse wound with mild soap and water Wound #6 Right,Lateral,Posterior Lower Leg: Clean wound with Normal Saline. Cleanse wound  with mild soap and water Wound #7 Right,Lateral Malleolus: Clean wound with Normal Saline. Cleanse wound with mild soap and water Wound #9 Left,Lateral Metatarsal head first: Clean wound with Normal Saline. Cleanse wound with mild soap and water Anesthetic: Wound #1 Left,Dorsal Foot: Topical Lidocaine 4% cream applied to wound bed prior to debridement - for clinic use only Wound #2 Right,Dorsal Foot: Topical Lidocaine 4% cream applied to wound bed prior to debridement - for clinic use only Wound #3 Left Calcaneus: Topical Lidocaine 4% cream applied to wound bed prior to debridement - for clinic use only Wound #4 Right Calcaneus: Topical Lidocaine 4% cream applied to wound bed prior to debridement - for clinic use only Wound #6 Right,Lateral,Posterior Lower Leg: Topical Lidocaine 4% cream applied to wound bed prior to debridement - for clinic use only Wound #7 Right,Lateral Malleolus: Topical Lidocaine 4% cream applied to wound bed prior to debridement - for clinic use only Wound #9 Left,Lateral Metatarsal head first: Topical Lidocaine 4% cream applied to wound bed prior to debridement - for clinic use only Skin Barriers/Peri-Wound Care: Wound #6 Right,Lateral,Posterior Lower Leg: Skin Prep Primary Wound Dressing: Wound #1 Left,Dorsal Foot: Iodoflex Wound #2 Right,Dorsal Foot: Iodoflex Wound #3 Left Calcaneus: Medihoney gel Wound #4 Right Calcaneus: Prisma Ag - moisten with saline Wound #6 Right,Lateral,Posterior Lower Leg: Prisma Ag - moisten with saline Wound #7 Right,Lateral Malleolus: Prisma Ag - moisten with saline Vankleeck, Katiria R. ([id number]) Wound #9 Left,Lateral Metatarsal head first: Prisma Ag - moisten with saline Secondary Dressing: Wound #1 Left,Dorsal Foot: ABD pad Dry Gauze Wound #2 Right,Dorsal Foot: ABD pad Dry Gauze Wound #3 Left Calcaneus: Dry Gauze Other - heel cup Wound #4 Right Calcaneus: Dry Gauze Other - heel cup Wound #6  Right,Lateral,Posterior Lower Leg: Dry Gauze Boardered Foam Dressing Wound #7 Right,Lateral Malleolus: ABD pad Dry Gauze Wound #9 Left,Lateral Metatarsal head first: ABD pad Dry Gauze Dressing Change Frequency: Wound #1 Left,Dorsal Foot: Three times weekly - pt will have this changed on Tuesdays in the [facility] Wound #2 Right,Dorsal Foot: Three times weekly - pt will have this changed on Tuesdays in the [facility] Wound #3 Left Calcaneus: Three times weekly - pt will have this changed on Tuesdays in the [facility] Wound #4 Right Calcaneus: Three times weekly - pt will have this changed on Tuesdays in the [facility] Wound #6 Right,Lateral,Posterior Lower Leg: Three times weekly - pt will have this changed on Tuesdays in the [facility] #7  Right,Lateral Malleolus: Three times weekly - pt will have this changed on Tuesdays in the [facility] Wound #9 Left,Lateral Metatarsal head first: Three times weekly - pt will have this changed on Tuesdays in the [facility] Follow-up Appointments: Wound #1 Left,Dorsal Foot: Return Appointment in 1 week. Wound #2 Right,Dorsal Foot: Return Appointment in 1 week. Wound #3 Left Calcaneus: Return Appointment in 1 week. Wound #4 Right Calcaneus: Return Appointment in 1 week. Wound #6 Right,Lateral,Posterior Lower Leg: Return Appointment in 1 week. Wound #7 Right,Lateral Malleolus: FREYA, ZOBRIST R. ([id number]) Return Appointment in 1 week. Wound #9 Left,Lateral Metatarsal head first: Return Appointment in 1 week. Edema Control: Wound #1 Left,Dorsal Foot: 3 Layer Compression System - Bilateral Elevate legs to the level of the heart and pump ankles as often as possible Wound #2 Right,Dorsal Foot: 3 Layer Compression System - Bilateral Elevate legs to the level of the heart and pump ankles as often as possible Wound #3 Left Calcaneus: 3 Layer Compression System - Bilateral Elevate legs to  the level of the heart and pump ankles as often as possible Wound #4 Right Calcaneus: 3 Layer Compression System - Bilateral Elevate legs to the level of the heart and pump ankles as often as possible Wound #6 Right,Lateral,Posterior Lower Leg: 3 Layer Compression System - Bilateral Elevate legs to the level of the heart and pump ankles as often as possible Wound #7 Right,Lateral Malleolus: 3 Layer Compression System - Bilateral Elevate legs to the level of the heart and pump ankles as often as possible Off-Loading: Wound #1 Left,Dorsal Foot: Turn and reposition every 2 hours Other: - float heels above heart level while in the bed Wound #2 Right,Dorsal Foot: Turn and reposition every 2 hours Other: - float heels above heart level while in the bed Wound #3 Left Calcaneus: Turn and reposition every 2 hours Other: - float heels above heart level while in the bed Wound #4 Right Calcaneus: Turn and reposition every 2 hours Other: - float heels above heart level while in the bed Wound #6 Right,Lateral,Posterior Lower Leg: Turn and reposition every 2 hours Other: - float heels above heart level while in the bed Wound #7 Right,Lateral Malleolus: Turn and reposition every 2 hours Other: - float heels above heart level while in the bed Additional Orders / Instructions: Wound #1 Left,Dorsal Foot: Increase protein intake. Wound #2 Right,Dorsal Foot: Increase protein intake. Wound #3 Left Calcaneus: Increase protein intake. Wound #4 Right Calcaneus: Increase protein intake. Wound #6 Right,Lateral,Posterior Lower Leg: Nimmons, Isel R. ([id number]) Increase protein intake. Wound #7 Right,Lateral Malleolus: Increase protein intake. Home Health: Wound #1 Left,Dorsal Foot: [place] Nurse may visit PRN to address patient s wound care needs. FACE TO FACE ENCOUNTER: MEDICARE and MEDICAID PATIENTS: I certify that this patient is under my care and that I had a  face-to-face encounter that meets the physician face-to-face encounter requirements with this patient on this date. The encounter with the patient was in whole or in part for the following MEDICAL CONDITION: (primary reason for [place]) MEDICAL NECESSITY: I certify, that based on my findings, NURSING services are a medically necessary home health service. HOME BOUND STATUS: I certify that my clinical findings support that this patient is homebound (i.e., Due to illness or injury, pt requires aid of supportive devices such as crutches, cane, wheelchairs, walkers, the use of special transportation or the assistance of another person to leave their place of residence. There is a normal inability  to leave the home and doing so requires considerable and taxing effort. Other absences are for medical reasons / religious services and are infrequent or of short duration when for other reasons). If current dressing causes regression in wound condition, may D/C ordered dressing product/s and apply Normal Saline Moist Dressing daily until next [place] / Other MD appointment. [place] of regression in wound condition at [phone]. Please direct any NON-WOUND related issues/requests for orders to patient's Primary Care Physician Wound #2 Right,Dorsal Foot: [place] Nurse may visit PRN to address patient s wound care needs. FACE TO FACE ENCOUNTER: MEDICARE and MEDICAID PATIENTS: I certify that this patient is under my care and that I had a face-to-face encounter that meets the physician face-to-face encounter requirements with this patient on this date. The encounter with the patient was in whole or in part for the following MEDICAL CONDITION: (primary reason for [place]) MEDICAL NECESSITY: I certify, that based on my findings, NURSING services are a medically necessary home health service. HOME BOUND STATUS: I certify that my  clinical findings support that this patient is homebound (i.e., Due to illness or injury, pt requires aid of supportive devices such as crutches, cane, wheelchairs, walkers, the use of special transportation or the assistance of another person to leave their place of residence. There is a normal inability to leave the home and doing so requires considerable and taxing effort. Other absences are for medical reasons / religious services and are infrequent or of short duration when for other reasons). If current dressing causes regression in wound condition, may D/C ordered dressing product/s and apply Normal Saline Moist Dressing daily until next [place] / Other MD appointment. [place] of regression in wound condition at [phone]. Please direct any NON-WOUND related issues/requests for orders to patient's Primary Care Physician Wound #3 Left Calcaneus: [place] Nurse may visit PRN to address patient s wound care needs. FACE TO FACE ENCOUNTER: MEDICARE and MEDICAID PATIENTS: I certify that this patient is under my care and that I had a face-to-face encounter that meets the physician face-to-face encounter requirements with this patient on this date. The encounter with the patient was in whole or in part for the following MEDICAL CONDITION: (primary reason for [place]) MEDICAL NECESSITY: I certify, that based on my findings, NURSING services are a medically necessary home health service. HOME BOUND STATUS: I certify that my clinical findings support that this patient is homebound (i.e., Due to illness or injury, pt requires aid of supportive devices such as crutches, cane, wheelchairs, walkers, the use of special transportation or the assistance of another person to leave their place of residence. There is a normal inability to leave the home and doing so requires considerable and taxing effort. Other absences are for  medical reasons / religious services and are infrequent or of short duration when for other reasons). ANTWANETTE, WESCHE R. ([id number]) If current dressing causes regression in wound condition, may D/C ordered dressing product/s and apply Normal Saline Moist Dressing daily until next [place] / Other MD appointment. [place] of regression in wound condition at [phone]. Please direct any NON-WOUND related issues/requests for orders to patient's Primary Care Physician Wound #4 Right Calcaneus: [place] Nurse may visit PRN to address patient s wound care needs. FACE TO FACE ENCOUNTER: MEDICARE and MEDICAID PATIENTS: I certify that this patient is  under my care and that I had a face-to-face encounter that meets the physician face-to-face encounter requirements with this patient on this date. The encounter with the patient was in whole or in part for the following MEDICAL CONDITION: (primary reason for [place]) MEDICAL NECESSITY: I certify, that based on my findings, NURSING services are a medically necessary home health service. HOME BOUND STATUS: I certify that my clinical findings support that this patient is homebound (i.e., Due to illness or injury, pt requires aid of supportive devices such as crutches, cane, wheelchairs, walkers, the use of special transportation or the assistance of another person to leave their place of residence. There is a normal inability to leave the home and doing so requires considerable and taxing effort. Other absences are for medical reasons / religious services and are infrequent or of short duration when for other reasons). If current dressing causes regression in wound condition, may D/C ordered dressing product/s and apply Normal Saline Moist Dressing daily until next [place] / Other MD appointment. [place] of regression in wound condition at  [phone]. Please direct any NON-WOUND related issues/requests for orders to patient's Primary Care Physician Wound #6 Right,Lateral,Posterior Lower Leg: [place] Nurse may visit PRN to address patient s wound care needs. FACE TO FACE ENCOUNTER: MEDICARE and MEDICAID PATIENTS: I certify that this patient is under my care and that I had a face-to-face encounter that meets the physician face-to-face encounter requirements with this patient on this date. The encounter with the patient was in whole or in part for the following MEDICAL CONDITION: (primary reason for [place]) MEDICAL NECESSITY: I certify, that based on my findings, NURSING services are a medically necessary home health service. HOME BOUND STATUS: I certify that my clinical findings support that this patient is homebound (i.e., Due to illness or injury, pt requires aid of supportive devices such as crutches, cane, wheelchairs, walkers, the use of special transportation or the assistance of another person to leave their place of residence. There is a normal inability to leave the home and doing so requires considerable and taxing effort. Other absences are for medical reasons / religious services and are infrequent or of short duration when for other reasons). If current dressing causes regression in wound condition, may D/C ordered dressing product/s and apply Normal Saline Moist Dressing daily until next [place] / Other MD appointment. [place] of regression in wound condition at [phone]. Please direct any NON-WOUND related issues/requests for orders to patient's Primary Care Physician Wound #7 Right,Lateral Malleolus: [place] Nurse may visit PRN to address patient s wound care needs. FACE TO FACE ENCOUNTER: MEDICARE and MEDICAID PATIENTS: I certify that this patient is under my care and that I had a face-to-face encounter  that meets the physician face-to-face encounter requirements with this patient on this date. The encounter with the patient was in whole or in part for the following MEDICAL CONDITION: (primary reason for [place]) MEDICAL NECESSITY: I certify, that based on my findings, NURSING services are a medically necessary home health service. HOME BOUND STATUS: I certify that my clinical findings support that this patient is homebound (i.e., Due to illness or injury, pt requires aid of supportive devices such as crutches, cane, wheelchairs, walkers, the use of special transportation or the assistance of another person to leave their place of residence. There is a normal inability to leave the home and doing  so requires considerable and taxing effort. Other absences are for medical reasons / religious services and are infrequent or of short duration when for other reasons). AVREE, SZCZYGIEL R. ([id number]) If current dressing causes regression in wound condition, may D/C ordered dressing product/s and apply Normal Saline Moist Dressing daily until next [place] / Other MD appointment. [place] of regression in wound condition at [phone]. Please direct any NON-WOUND related issues/requests for orders to patient's Primary Care Physician Wound #9 Left,Lateral Metatarsal head first: [place] Nurse may visit PRN to address patient s wound care needs. FACE TO FACE ENCOUNTER: MEDICARE and MEDICAID PATIENTS: I certify that this patient is under my care and that I had a face-to-face encounter that meets the physician face-to-face encounter requirements with this patient on this date. The encounter with the patient was in whole or in part for the following MEDICAL CONDITION: (primary reason for [place]) MEDICAL NECESSITY: I certify, that based on my findings, NURSING services are a medically necessary home health service. HOME BOUND  STATUS: I certify that my clinical findings support that this patient is homebound (i.e., Due to illness or injury, pt requires aid of supportive devices such as crutches, cane, wheelchairs, walkers, the use of special transportation or the assistance of another person to leave their place of residence. There is a normal inability to leave the home and doing so requires considerable and taxing effort. Other absences are for medical reasons / religious services and are infrequent or of short duration when for other reasons). If current dressing causes regression in wound condition, may D/C ordered dressing product/s and apply Normal Saline Moist Dressing daily until next [place] / Other MD appointment. [place] of regression in wound condition at [phone]. Please direct any NON-WOUND related issues/requests for orders to patient's Primary Care Physician Medications-please add to medication list.: Wound #1 Left,Dorsal Foot: Other: - Vitamin C, Zinc, MVI Wound #2 Right,Dorsal Foot: Other: - Vitamin C, Zinc, MVI Wound #3 Left Calcaneus: Other: - Vitamin C, Zinc, MVI Wound #4 Right Calcaneus: Other: - Vitamin C, Zinc, MVI Wound #6 Right,Lateral,Posterior Lower Leg: Other: - Vitamin C, Zinc, MVI Wound #7 Right,Lateral Malleolus: Other: - Vitamin C, Zinc, MVI Wound #9 Left,Lateral Metatarsal head first: Other: - Vitamin C, Zinc, MVI #1 I did not change any of the dressings today still using Iodoflex on the dorsal ankle wounds bilaterally and [employer] #2 Medihoney to the left heel #3 to the left medial heel and right lateral knee [employer] and [place], Ciarrah R. ([id number]) #4 I am probably going to attempt to remove the tendon on the right anterior foot next week. This will hopefully give this room to epithelialize over. #5 I am increasingly concerned about this area on her face which I first noticed either last week or the week before. At that time  the explanation of pressure from his CPAP mask seemed reasonable although today she has a necrotic-looking surface over part of this and surrounding induration. This all may be pressure but I found myself worrying about an underlying malignancy to this. I've asked her husband to get her a different mask that puts pressure in a different area. Put Polysporin on this twice a day and a Band-Aid. This may need to go to dermatology Electronic Signature(s) Signed: 04/06/2017 7:40:19 AM By: Linton Ham MD Signed: 04/11/2017 10:33:24 AM By: Gretta Cool RN, BSN, Kim RN, BSN Previous Signature: 03/31/2017 11:54:41 AM Version  By: Gretta Cool, RN, BSN, Kim RN, BSN Previous Signature: 03/01/2017 4:28:03 PM Version By: Linton Ham MD Entered By: Gretta Cool RN, BSN, Kim on 04/04/2017 11:09:53 [name] ([id number]) -------------------------------------------------------------------------------- SuperBill Details Patient Name: [name] Date of Service: 03/01/2017 Medical Record Patient Account Number: [id number] Number: Treating RN: Ahmed Prima [birth year] (83 y.o. 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 Facility Procedures CPT4: Description Modifier Quantity Code [id number] 11043 - DEB MUSC/FASCIA  20 SQ CM/< 1 ICD-10 Description Diagnosis 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 Physician Procedures CPT4: Description Modifier Quantity Code [id number] 65035 - WC PHYS DEBR MUSCLE/FASCIA 20 SQ CM 1 ICD-10 Description Diagnosis 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

## 2017-03-02 NOTE — Progress Notes (Signed)
BRIARROSE, SHOR ([id number]) Visit Report for 03/01/2017 Arrival Information Details Patient Name: [name], [name] Date of Service: 03/01/2017 1:30 PM Medical Record Patient Account Number: [id number] Number: Treating RN: Ahmed Prima March 29, 1934 (83 y.o. Other Clinician: Date of Birth/Sex: Female) Treating ROBSON, [place] Primary Care Mashanda Ishibashi: Emily Filbert Ranyah Groeneveld/Extender: G Referring Taleigha Pinson: Melina Modena in Treatment: 8 Visit Information History Since Last Visit Added or deleted any medications: No Patient Arrived: Wheel Chair Any new allergies or adverse reactions: No Arrival Time: 13:33 Had a fall or experienced change in No activities of daily living that may affect Accompanied By: spouse risk of falls: Transfer Assistance: [employer] streamer Signs or symptoms of abuse/neglect since last No Patient Identification Verified: Yes visito Secondary Verification Process Yes Hospitalized since last visit: No Completed: Has Dressing in Place as Prescribed: Yes Patient Requires Transmission-Based No Has Compression in Place as Prescribed: Yes Precautions: Pain Present Now: No Patient Has Alerts: Yes Patient Alerts: DM II Electronic Signature(s) Signed: 03/01/2017 4:55:45 PM By: Alric Quan Entered By: Alric Quan on 03/01/2017 13:36:52 [name] ([id number]) -------------------------------------------------------------------------------- Encounter Discharge Information Details Patient Name: [name] Date of Service: 03/01/2017 1:30 PM Medical Record Patient Account Number: [id number] Number: Treating RN: Ahmed Prima 1934/11/23 (83 y.o. Other Clinician: Date of Birth/Sex: Female) Treating ROBSON, MICHAEL Primary Care Franklin Clapsaddle: Emily Filbert Jovonna Nickell/Extender: G Referring Breon Diss: Melina Modena in Treatment: 8 Encounter Discharge Information Items Discharge Pain Level: 0 Discharge Condition: Stable Ambulatory Status:  Wheelchair Discharge Destination: Home Transportation: Private Auto Accompanied By: spouse Schedule Follow-up Appointment: Yes Medication Reconciliation completed and provided to Patient/Care No Reine Bristow: Provided on Clinical Summary of Care: 03/01/2017 Form Type Recipient Paper Patient MB Electronic Signature(s) Signed: 03/01/2017 2:40:05 PM By: Ruthine Dose Entered By: Ruthine Dose on 03/01/2017 14:40:05 [name] ([id number]) -------------------------------------------------------------------------------- Lower Extremity Assessment Details Patient Name: [name] Date of Service: 03/01/2017 1:30 PM Medical Record Patient Account Number: [id number] Number: Treating RN: Ahmed Prima October 05, 1934 (83 y.o. Other Clinician: Date of Birth/Sex: Female) Treating ROBSON, MICHAEL Primary Care Ignatius Kloos: Emily Filbert Flornce Record/Extender: G Referring Amirra Herling: Melina Modena in Treatment: 8 Edema Assessment Assessed: [Left: No] [Right: No] E[Left: dema] [Right: :] Calf Left: Right: Point of Measurement: 31 cm From Medial Instep 45.3 cm 37 cm Ankle Left: Right: Point of Measurement: 8 cm From Medial Instep 27.3 cm 27.5 cm Vascular Assessment Pulses: Dorsalis Pedis Palpable: [Left:Yes] [Right:Yes] Posterior Tibial Extremity colors, hair growth, and conditions: Extremity Color: [Left:Hyperpigmented] [Right:Hyperpigmented] Temperature of Extremity: [Left:Warm] [Right:Warm] Capillary Refill: [Left:> 3 seconds] [Right:> 3 seconds] Toe Nail Assessment Left: Right: Thick: No No Discolored: No No Deformed: No No Improper Length and Hygiene: Yes Yes Electronic Signature(s) Signed: 03/01/2017 4:55:45 PM By: Alric Quan Entered By: Alric Quan on 03/01/2017 14:00:57 [name] ([id number]) [name], [name] ([id number]) -------------------------------------------------------------------------------- Multi Wound Chart Details Patient Name: [name] Date of Service: 03/01/2017 1:30 PM Medical Record Patient Account Number: [id number] Number: Treating RN: Ahmed Prima Jul 20, 1934 (83 y.o. Other Clinician: Date of Birth/Sex: Female) Treating ROBSON, MICHAEL Primary Care Valia Wingard: Emily Filbert Jaquelin Meaney/Extender: G Referring Mitzie Marlar: Melina Modena in Treatment: 8 Vital Signs Height(in): 61 Pulse(bpm): 101 Weight(lbs): 206 Blood Pressure 94/47 (mmHg): Body Mass Index(BMI): 39 Temperature(F): 97.5 Respiratory Rate 18 (breaths/min): Photos: [1:No Photos] [2:No Photos] [3:No Photos] Wound Location: [1:Left Foot - Dorsal] [2:Right Foot - Dorsal] [3:Left Calcaneus] Wounding Event: [1:Gradually Appeared] [2:Gradually Appeared] [3:Pressure Injury] Primary Etiology: [1:Diabetic Wound/Ulcer of the Lower Extremity] [2:Diabetic Wound/Ulcer of  the Lower Extremity] [3:Pressure Ulcer] Comorbid History: [1:Sleep Apnea, Arrhythmia, Hypertension, Cirrhosis , Type II Diabetes, End Stage Renal Disease, Gout, Osteoarthritis] [2:Sleep Apnea, Arrhythmia, Hypertension, Cirrhosis , Type II Diabetes, End Stage Renal Disease, Gout,  Osteoarthritis] [3:Sleep Apnea, Arrhythmia, Hypertension, Cirrhosis , Type II Diabetes, End Stage Renal Disease, Gout, Osteoarthritis] Date Acquired: [1:11/04/2016] [2:11/04/2016] [3:11/04/2016] Weeks of Treatment: [1:8] [2:8] [3:8] Wound Status: [1:Open] [2:Open] [3:Open] Measurements L x W x D 3.8x4.7x0.4 [2:5.3x2.5x0.3] [3:3.5x7.8x0.1] (cm) Area (cm) : [1:14.027] [2:10.407] [3:21.441] Volume (cm) : [1:5.611] [2:3.122] [3:2.144] % Reduction in Area: [1:0.80%] [2:44.80%] [3:24.20%] % Reduction in Volume: -98.50% [2:17.20%] [3:24.20%] Starting Position 1 12 (o'clock): Ending Position 1 [1:1] (o'clock): Maximum Distance 1 1.3 (cm): Undermining: [1:Yes] [2:No] [3:No] Classification: [1:Grade 1] [2:Grade 2] [3:Category/Stage II] HBO Classification: [1:N/A] [2:N/A] [3:Grade 1] Exudate  Amount: Large Large Large Exudate Type: Serosanguineous Serosanguineous Serous Exudate Color: red, brown red, brown amber Wound Margin: Well defined, not attached Distinct, outline attached Distinct, outline attached Granulation Amount: Medium (34-66%) Small (1-33%) None Present (0%) Granulation Quality: Red, Pink Pink, Hyper-granulation N/A Necrotic Amount: Medium (34-66%) Large (67-100%) Large (67-100%) Necrotic Tissue: Eschar, Adherent Slough Eschar, Adherent Slough Eschar Exposed Structures: Fat Layer (Subcutaneous Fat Layer (Subcutaneous N/A Tissue) Exposed: Yes Tissue) Exposed: Yes Fascia: No Fascia: No Tendon: No Tendon: No Muscle: No Muscle: No Joint: No Joint: No Bone: No Bone: No Epithelialization: None None None Debridement: N/A Debridement ([id number]) Pre-procedure N/A 14:08 N/A Verification/Time Out Taken: Pain Control: N/A Lidocaine 4% Topical N/A Solution Tissue Debrided: N/A Necrotic/Eschar, N/A Fibrin/Slough Level: N/A Skin/Subcutaneous N/A Tissue Debridement Area (sq N/A 13.25 N/A cm): Instrument: N/A Blade, Forceps N/A Bleeding: N/A Moderate N/A Hemostasis Achieved: N/A Silver Nitrate N/A Procedural Pain: N/A 0 N/A Post Procedural Pain: N/A 0 N/A Debridement Treatment N/A Procedure was tolerated N/A Response: well Post Debridement N/A 5.3x2.5x0.3 N/A Measurements L x W x D (cm) Post Debridement N/A 3.122 N/A Volume: (cm) Periwound Skin Texture: Excoriation: Yes No Abnormalities Noted No Abnormalities Noted Induration: Yes Callus: Yes Crepitus: Yes Rash: Yes Scarring: Yes Periwound Skin Maceration: Yes Maceration: Yes No Abnormalities Noted Moisture: Dry/ScalyAMBREA, [name] ([id number]) Periwound Skin Color: Atrophie Blanche: Yes Erythema: Yes Erythema: Yes Cyanosis: Yes Ecchymosis: Yes Erythema: Yes Hemosiderin Staining: Yes Mottled: Yes Pallor: Yes Rubor: Yes Erythema Location: Circumferential Circumferential  Circumferential Temperature: No Abnormality No Abnormality No Abnormality Tenderness on Yes Yes Yes Palpation: Wound Preparation: Ulcer Cleansing: Other: Ulcer Cleansing: Ulcer Cleansing: soap and water Rinsed/Irrigated with Rinsed/Irrigated with Saline Saline Topical Anesthetic Applied: Other: lidocaine Topical Anesthetic Topical Anesthetic 4% Applied: Other: Applied: Other: LIDOCAINE 4% LIDOCAINE 4% Procedures Performed: N/A Debridement N/A Wound Number: [id number] 7  Photos: No Photos No Photos No Photos Wound Location: Right Calcaneus Right Lower Leg - Lateral, Right Malleolus - Lateral Posterior Wounding Event: Pressure Injury Gradually Appeared Gradually Appeared Primary Etiology: Pressure Ulcer 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 the Lower Extremity Comorbid History: Sleep Apnea, Arrhythmia, Sleep Apnea, Arrhythmia, Sleep Apnea, Arrhythmia, Hypertension, Cirrhosis , Hypertension, Cirrhosis , Hypertension, Cirrhosis , Type II Diabetes, End Type II Diabetes, End Type II Diabetes, End Stage Renal Disease, Stage Renal Disease, Stage Renal Disease, Gout, Osteoarthritis Gout, Osteoarthritis Gout, Osteoarthritis Date Acquired: 11/04/2016 11/04/2016 01/11/2017 Weeks of Treatment: 8 8 7  Wound Status: Open Open Open Measurements L x W x D 2.5x1x0.2 4.3x1.2x2 0.2x0.2x0.1 (cm) Area (cm) : 1.963 4.053 0.031 Volume (cm) : 0.393 8.105 0.003 % Reduction in Area: 90.00% 60.30% 93.80% % Reduction in Volume:  90.00% 47.10% 98.00% Undermining: No No No Classification: Category/Stage II Grade 2 Grade 2 HBO Classification: Grade 1 N/A N/A Exudate Amount: Large Large Large Exudate Type: Serosanguineous Serosanguineous Serous Exudate Color: red, brown red, brown amber Wound Margin: Distinct, outline attached Distinct, outline attached Distinct, outline attached Spangler, Reid R. ([id number]) Granulation Amount: None Present (0%) Large (67-100%) Large  (67-100%) Granulation Quality: N/A Red, Pink Red Necrotic Amount: Large (67-100%) Small (1-33%) None Present (0%) Necrotic Tissue: Eschar, Adherent Hazen N/A Exposed Structures: N/A N/A N/A Epithelialization: None None None Debridement: N/A N/A N/A Pain Control: N/A N/A N/A Tissue Debrided: N/A N/A N/A Level: N/A N/A N/A Debridement Area (sq N/A N/A N/A cm): Instrument: N/A N/A N/A Bleeding: N/A N/A N/A Hemostasis Achieved: N/A N/A N/A Procedural Pain: N/A N/A N/A Post Procedural Pain: N/A N/A N/A Debridement Treatment N/A N/A N/A Response: Post Debridement N/A N/A N/A Measurements L x W x D (cm) Post Debridement N/A N/A N/A Volume: (cm) Periwound Skin Texture: No Abnormalities Noted No Abnormalities Noted Excoriation: No Induration: No Callus: No Crepitus: No Rash: No Scarring: No Periwound Skin No Abnormalities Noted Maceration: Yes Maceration: No Moisture: Dry/Scaly: No Periwound Skin Color: Erythema: Yes Erythema: Yes Atrophie Blanche: No Cyanosis: No Ecchymosis: No Erythema: No Hemosiderin Staining: No Mottled: No Pallor: No Rubor: No Erythema Location: Circumferential Circumferential N/A Temperature: No Abnormality No Abnormality No Abnormality Tenderness on Yes Yes No Palpation: Wound Preparation: Ulcer Cleansing: Ulcer Cleansing: Ulcer Cleansing: Rinsed/Irrigated with Rinsed/Irrigated with Rinsed/Irrigated with Saline Saline, Other: soap and Saline water Topical Anesthetic Topical Anesthetic Topical Anesthetic [name], Connie R. ([id number]) Applied: Other: Applied: Other: Applied: Other: lidocaine LIDOCAINE 4% LIDOCAINE 4% 4% Procedures Performed: N/A N/A N/A Wound Number: 9 N/A N/A Photos: No Photos N/A N/A Wound Location: Left Metatarsal head first - N/A N/A Lateral Wounding Event: Gradually Appeared N/A N/A Primary Etiology: Diabetic Wound/Ulcer of N/A N/A the Lower Extremity Comorbid History: Sleep Apnea, Arrhythmia, N/A  N/A Hypertension, Cirrhosis , Type II Diabetes, End Stage Renal Disease, Gout, Osteoarthritis Date Acquired: 02/01/2017 N/A N/A Weeks of Treatment: 4 N/A N/A Wound Status: Open N/A N/A Measurements L x W x D 0.5x0.5x0.2 N/A N/A (cm) Area (cm) : 0.196 N/A N/A Volume (cm) : 0.039 N/A N/A % Reduction in Area: -108.50% N/A N/A % Reduction in Volume: -333.30% N/A N/A Undermining: No N/A N/A Classification: Grade 2 N/A N/A HBO Classification: N/A N/A N/A Exudate Amount: Large N/A N/A Exudate Type: Serosanguineous N/A N/A Exudate Color: red, brown N/A N/A Wound Margin: Distinct, outline attached N/A N/A Granulation Amount: None Present (0%) N/A N/A Granulation Quality: N/A N/A N/A Necrotic Amount: Large (67-100%) N/A N/A Necrotic Tissue: Eschar N/A N/A Exposed Structures: N/A N/A N/A Epithelialization: None N/A N/A Debridement: N/A N/A N/A Pain Control: N/A N/A N/A Tissue Debrided: N/A N/A N/A Level: N/A N/A N/A Debridement Area (sq N/A N/A N/A cm): Instrument: N/A N/A N/A Bleeding: N/A N/A N/A Hemostasis Achieved: N/A N/A N/A Procedural Pain: N/A N/A N/A Post Procedural Pain: N/A N/A N/A [name], Connie R. ([id number]) Debridement Treatment N/A N/A N/A Response: Post Debridement N/A N/A N/A Measurements L x W x D (cm) Post Debridement N/A N/A N/A Volume: (cm) Periwound Skin Texture: No Abnormalities Noted N/A N/A Periwound Skin Maceration: Yes N/A N/A Moisture: Periwound Skin Color: No Abnormalities Noted N/A N/A Erythema Location: N/A N/A N/A Temperature: N/A N/A N/A Tenderness on Yes N/A N/A Palpation: Wound Preparation: Ulcer Cleansing: N/A N/A Rinsed/Irrigated with Saline, Other: soap and water Topical Anesthetic  Applied: Other: lidocaine 4% Procedures Performed: N/A N/A N/A Treatment Notes Electronic Signature(s) Signed: 03/01/2017 4:28:03 PM By: Linton Ham MD Entered By: Linton Ham on 03/01/2017 14:46:15 [name]  ([id number]) -------------------------------------------------------------------------------- [place] Details Patient Name: [name] Date of Service: 03/01/2017 1:30 PM Medical Record Patient Account Number: [id number] Number: Treating RN: Ahmed Prima [birth year] (83 y.o. Other Clinician: Date of Birth/Sex: Female) Treating ROBSON, [place] Primary Care Larson Limones: Emily Filbert Amberly Livas/Extender: G Referring Fuad Forget: Melina Modena in Treatment: 8 Active Inactive ` Abuse / Safety / Falls / Self Care Management Nursing Diagnoses: Potential for falls Goals: Patient will remain injury free Date Initiated: 01/04/2017 Target Resolution Date: 02/24/2017 Goal Status: Active Interventions: Assess fall risk on admission and as needed Assess self care needs on admission and as needed Notes: ` Nutrition Nursing Diagnoses: Imbalanced nutrition Impaired glucose control: actual or potential Potential for alteratiion in Nutrition/Potential for imbalanced nutrition Goals: Patient/caregiver agrees to and verbalizes understanding of need to use nutritional supplements and/or vitamins as prescribed Date Initiated: 01/04/2017 Target Resolution Date: 02/24/2017 Goal Status: Active Interventions: Assess patient nutrition upon admission and as needed per policy Notes: KATILYNN, SINKLER ([id number]) Orientation to the Wound Care Program Nursing Diagnoses: Knowledge deficit related to the wound healing center program Goals: Patient/caregiver will verbalize understanding of the [name] Date Initiated: 01/04/2017 Target Resolution Date: 02/24/2017 Goal Status: Active Interventions: Provide education on orientation to the wound center Notes: ` Pain, Acute or Chronic Nursing Diagnoses: Pain, acute or chronic: actual or potential Potential alteration in comfort, pain Goals: Patient will verbalize adequate pain control and receive  pain control interventions during procedures as needed Date Initiated: 01/04/2017 Target Resolution Date: 02/24/2017 Goal Status: Active Interventions: Assess comfort goal upon admission Complete pain assessment as per visit requirements Notes: ` Wound/Skin Impairment Nursing Diagnoses: Impaired tissue integrity Goals: Ulcer/skin breakdown will have a volume reduction of 80% by week 12 Date Initiated: 01/04/2017 Target Resolution Date: 02/24/2017 Goal Status: Active Interventions: Assess patient/caregiver ability to perform ulcer/skin care regimen upon admission and as needed [name], [name] ([id number]) Notes: Electronic Signature(s) Signed: 03/01/2017 4:55:45 PM By: Alric Quan Entered By: Alric Quan on 03/01/2017 14:01:05 [name] ([id number]) -------------------------------------------------------------------------------- Pain Assessment Details Patient Name: [name] Date of Service: 03/01/2017 1:30 PM Medical Record Patient Account Number: [id number] Number: Treating RN: Ahmed Prima July 30, 1934 (83 y.o. Other Clinician: Date of Birth/Sex: Female) Treating ROBSON, MICHAEL Primary Care Henreitta Spittler: Emily Filbert Sonnet Rizor/Extender: G Referring Ryle Buscemi: Melina Modena in Treatment: 8 Active Problems Location of Pain Severity and Description of Pain Patient Has Paino Yes Site Locations Pain Location: Pain in Ulcers With Dressing Change: Yes Duration of the Pain. Constant / Intermittento Intermittent Pain Management and Medication Current Pain Management: Electronic Signature(s) Signed: 03/01/2017 4:55:45 PM By: Alric Quan Entered By: Alric Quan on 03/01/2017 13:37:10 [name] ([id number]) -------------------------------------------------------------------------------- Patient/Caregiver Education Details Patient Name: [name] Date of Service: 03/01/2017 1:30 PM Medical Record Patient Account Number:  [id number] Number: Treating RN: Ahmed Prima 17-Mar-1934 (83 y.o. Other Clinician: Date of Birth/Gender: Female) Treating ROBSON, [place] Primary Care Physician: Emily Filbert Physician/Extender: G Referring Physician: Melina Modena in Treatment: 8 Education Assessment Education Provided To: Patient and Caregiver Education Topics Provided Wound/Skin Impairment: Handouts: Other: please try a different mask for your cpap Methods: Demonstration, Explain/Verbal Responses: State content correctly Electronic Signature(s) Signed: 03/01/2017 4:55:45 PM By: Alric Quan Entered By: Alric Quan on 03/01/2017 14:07:10 Mallin, Kirrah R. (  [id number]) -------------------------------------------------------------------------------- Wound Assessment Details Patient Name: [name], [name] Date of Service: 03/01/2017 1:30 PM Medical Record Patient Account Number: [id number] Number: Treating RN: Ahmed Prima 01-Jan-1934 (83 y.o. Other Clinician: Date of Birth/Sex: Female) Treating ROBSON, Gu[place]upe [place] Primary Care Nohemy Koop: Emily Filbert Johnmatthew Solorio/Extender: G Referring Quantavia Frith: Melina Modena in Treatment: 8 Wound Status Wound Number: 1 Primary Diabetic Wound/Ulcer of the Lower Etiology: Extremity Wound Location: Left Foot - Dorsal Wound Open Wounding Event: Gradually Appeared Status: Date Acquired: 11/04/2016 Comorbid Sleep Apnea, Arrhythmia, Hypertension, Weeks Of Treatment: 8 History: Cirrhosis , Type II Diabetes, End Stage Clustered Wound: No Renal Disease, Gout, Osteoarthritis Photos Photo Uploaded By: Alric Quan on 03/01/2017 16:33:58 Wound Measurements Length: (cm) 3.8 % Reduction in Width: (cm) 4.7 % Reduction in Depth: (cm) 0.4 Epithelializati Area: (cm) 14.027 Tunneling: Volume: (cm) 5.611 Undermining: Starting Pos Ending Posit Maximum Dist Area: 0.8% Volume: -98.5% on: None No Yes ition (o'clock): 12 ion (o'clock):  1 ance: (cm) 1.3 Wound Description Classification: Grade 1 Foul Odor Afte Wound Margin: Well defined, not attached Slough/Fibrino Exudate Amount: Large Exudate Type: Serosanguineous Exudate Color: red, brown Mcauliffe, Katherin R. ([id number]) r Cleansing: No Yes Wound Bed Granulation Amount: Medium (34-66%) Exposed Structure Granulation Quality: Red, Pink Fascia Exposed: No Necrotic Amount: Medium (34-66%) Fat Layer (Subcutaneous Tissue) Exposed: Yes Necrotic Quality: Eschar, Adherent Slough Tendon Exposed: No Muscle Exposed: No Joint Exposed: No Bone Exposed: No Periwound Skin Texture Texture Color No Abnormalities Noted: No No Abnormalities Noted: No Callus: Yes Atrophie Blanche: Yes Crepitus: Yes Cyanosis: Yes Excoriation: Yes Ecchymosis: Yes Induration: Yes Erythema: Yes Rash: Yes Erythema Location: Circumferential Scarring: Yes Hemosiderin Staining: Yes Mottled: Yes Moisture Pallor: Yes No Abnormalities Noted: No Rubor: Yes Dry / Scaly: Yes Maceration: Yes Temperature / Pain Temperature: No Abnormality Tenderness on Palpation: Yes Wound Preparation Ulcer Cleansing: Other: soap and water, Topical Anesthetic Applied: Other: lidocaine 4%, Treatment Notes Wound #1 (Left, Dorsal Foot) 1. Cleansed with: Clean wound with Normal Saline Cleanse wound with antibacterial soap and water 2. Anesthetic Topical Lidocaine 4% cream to wound bed prior to debridement 4. Dressing Applied: Iodoflex 5. Secondary Dressing Applied ABD Pad 7. Secured with Tape 3 Layer Compression System - Bilateral Electronic Signature(s) Signed: 03/01/2017 4:55:45 PM By: Mertie Moores, [name] ([id number]) Entered By: Alric Quan on 03/01/2017 13:53:38 [name] ([id number]) -------------------------------------------------------------------------------- Wound Assessment Details Patient Name: [name] Date of Service: 03/01/2017 1:30 PM Medical Record Patient  Account Number: [id number] Number: Treating RN: Ahmed Prima 10-Nov-1934 (83 y.o. Other Clinician: Date of Birth/Sex: Female) Treating ROBSON, [place] Primary Care Juliannah Ohmann: Emily Filbert Loranda Mastel/Extender: G Referring Karren Newland: Melina Modena in Treatment: 8 Wound Status Wound Number: 2 Primary Diabetic Wound/Ulcer of the Lower Etiology: Extremity Wound Location: Right Foot - Dorsal Wound Open Wounding Event: Gradually Appeared Status: Date Acquired: 11/04/2016 Comorbid Sleep Apnea, Arrhythmia, Hypertension, Weeks Of Treatment: 8 History: Cirrhosis , Type II Diabetes, End Stage Clustered Wound: No Renal Disease, Gout, Osteoarthritis Photos Photo Uploaded By: Alric Quan on 03/01/2017 16:33:59 Wound Measurements Length: (cm) 5.3 Width: (cm) 2.5 Depth: (cm) 0.3 Area: (cm) 10.407 Volume: (cm) 3.122 % Reduction in Area: 44.8% % Reduction in Volume: 17.2% Epithelialization: None Tunneling: No Undermining: No Wound Description Classification: Grade 2 Foul Odor Aft Wound Margin: Distinct, outline attached Slough/Fibrin Exudate Amount: Large Exudate Type: Serosanguineous Exudate Color: red, brown er Cleansing: No o Yes Wound Bed Granulation Amount: Small (1-33%) Exposed Structure Granulation Quality: Pink, Hyper-granulation Fascia Exposed: No Hoobler, Yena R. ([id number]) Necrotic  Amount: Large (67-100%) Fat Layer (Subcutaneous Tissue) Exposed: Yes Necrotic Quality: Eschar, Adherent Slough Tendon Exposed: No Muscle Exposed: No Joint Exposed: No Bone Exposed: No Periwound Skin Texture Texture Color No Abnormalities Noted: No No Abnormalities Noted: No Erythema: Yes Moisture Erythema Location: Circumferential No Abnormalities Noted: No Maceration: Yes Temperature / Pain Temperature: No Abnormality Tenderness on Palpation: Yes Wound Preparation Ulcer Cleansing: Rinsed/Irrigated with Saline Topical Anesthetic Applied: Other: LIDOCAINE  4%, Treatment Notes Wound #2 (Right, Dorsal Foot) 1. Cleansed with: Clean wound with Normal Saline Cleanse wound with antibacterial soap and water 2. Anesthetic Topical Lidocaine 4% cream to wound bed prior to debridement 4. Dressing Applied: Iodoflex 5. Secondary Dressing Applied ABD Pad 7. Secured with Tape 3 Layer Compression System - Bilateral Electronic Signature(s) Signed: 03/01/2017 4:55:45 PM By: Alric Quan Entered By: Alric Quan on 03/01/2017 13:54:52 [name] ([id number]) -------------------------------------------------------------------------------- Wound Assessment Details Patient Name: [name] Date of Service: 03/01/2017 1:30 PM Medical Record Patient Account Number: [id number] Number: Treating RN: Ahmed Prima March 19, 1934 (83 y.o. Other Clinician: Date of Birth/Sex: Female) Treating ROBSON, [place] Primary Care Liliana Brentlinger: Emily Filbert Kawehi Hostetter/Extender: G Referring Nykia Turko: Melina Modena in Treatment: 8 Wound Status Wound Number: 3 Primary Pressure Ulcer Etiology: Wound Location: Left Calcaneus Wound Open Wounding Event: Pressure Injury Status: Date Acquired: 11/04/2016 Comorbid Sleep Apnea, Arrhythmia, Hypertension, Weeks Of Treatment: 8 History: Cirrhosis , Type II Diabetes, End Stage Clustered Wound: No Renal Disease, Gout, Osteoarthritis Photos Photo Uploaded By: Alric Quan on 03/01/2017 16:34:33 Wound Measurements Length: (cm) 3.5 Width: (cm) 7.8 Depth: (cm) 0.1 Area: (cm) 21.441 Volume: (cm) 2.144 % Reduction in Area: 24.2% % Reduction in Volume: 24.2% Epithelialization: None Tunneling: No Undermining: No Wound Description Classification: Category/Stage II Foul O Diabetic Severity (Wagner): Grade 1 Slough Wound Margin: Distinct, outline attached Exudate Amount: Large Exudate Type: Serous Exudate Color: amber dor After Cleansing: No /Fibrino No Wound Bed Granulation Amount: None  Present (0%) [place]ire, Shealynn R. ([id number]) Necrotic Amount: Large (67-100%) Necrotic Quality: Eschar Periwound Skin Texture Texture Color No Abnormalities Noted: No No Abnormalities Noted: No Erythema: Yes Moisture Erythema Location: Circumferential No Abnormalities Noted: No Temperature / Pain Temperature: No Abnormality Tenderness on Palpation: Yes Wound Preparation Ulcer Cleansing: Rinsed/Irrigated with Saline Topical Anesthetic Applied: Other: LIDOCAINE 4%, Treatment Notes Wound #3 (Left Calcaneus) 1. Cleansed with: Clean wound with Normal Saline Cleanse wound with antibacterial soap and water 2. Anesthetic Topical Lidocaine 4% cream to wound bed prior to debridement 4. Dressing Applied: Medihoney Gel 5. Secondary Dressing Applied Dry Gauze 7. Secured with Tape Notes heel cup Electronic Signature(s) Signed: 03/01/2017 4:55:45 PM By: Alric Quan Entered By: Alric Quan on 03/01/2017 13:55:44 [name] ([id number]) -------------------------------------------------------------------------------- Wound Assessment Details Patient Name: [name] Date of Service: 03/01/2017 1:30 PM Medical Record Patient Account Number: [id number] Number: Treating RN: Ahmed Prima Jan 08, 1934 (83 y.o. Other Clinician: Date of Birth/Sex: Female) Treating ROBSON, [place]is[place] Primary Care Argelia Formisano: Emily Filbert Marton Malizia/Extender: G Referring Adoni Greenough: Melina Modena in Treatment: 8 Wound Status Wound Number: 4 Primary Pressure Ulcer Etiology: Wound Location: Right Calcaneus Wound Open Wounding Event: Pressure Injury Status: Date Acquired: 11/04/2016 Comorbid Sleep Apnea, Arrhythmia, Hypertension, Weeks Of Treatment: 8 History: Cirrhosis , Type II Diabetes, End Stage Clustered Wound: No Renal Disease, Gout, Osteoarthritis Photos Photo Uploaded By: Alric Quan on 03/01/2017 16:34:33 Wound Measurements Length: (cm) 2.5 Width: (cm) 1 Depth:  (cm) 0.2 Area: (cm) 1.963 Volume: (cm) 0.393 % Reduction in Area: 90% % Reduction in Volume: 90% Epithelialization:  None Tunneling: No Undermining: No Wound Description Classification: Category/Stage II Foul O Diabetic Severity (Wagner): Grade 1 Slough Wound Margin: Distinct, outline attached Exudate Amount: Large Exudate Type: Serosanguineous Exudate Color: red, brown dor After Cleansing: No /Fibrino No Wound Bed Granulation Amount: None Present (0%) [name], Connie R. ([id number]) Necrotic Amount: Large (67-100%) Necrotic Quality: Eschar, Adherent Slough Periwound Skin Texture Texture Color No Abnormalities Noted: No No Abnormalities Noted: No Erythema: Yes Moisture Erythema Location: Circumferential No Abnormalities Noted: No Temperature / Pain Temperature: No Abnormality Tenderness on Palpation: Yes Wound Preparation Ulcer Cleansing: Rinsed/Irrigated with Saline Topical Anesthetic Applied: Other: LIDOCAINE 4%, Treatment Notes Wound #4 (Right Calcaneus) 1. Cleansed with: Clean wound with Normal Saline Cleanse wound with antibacterial soap and water 2. Anesthetic Topical Lidocaine 4% cream to wound bed prior to debridement 4. Dressing Applied: Prisma Ag 5. Secondary Dressing Applied Dry Gauze 7. Secured with Tape 3 Layer Compression System - Bilateral Notes heel cup Electronic Signature(s) Signed: 03/01/2017 4:55:45 PM By: Alric Quan Entered By: Alric Quan on 03/01/2017 13:56:45 [name] ([id number]) -------------------------------------------------------------------------------- Wound Assessment Details Patient Name: [name] Date of Service: 03/01/2017 1:30 PM Medical Record Patient Account Number: [id number] Number: Treating RN: Ahmed Prima 1934-06-07 (83 y.o. Other Clinician: Date of Birth/Sex: Female) Treating ROBSON, [place] Primary Care Daivion Pape: Emily Filbert Lousie Calico/Extender: G Referring Alta Goding: Melina Modena in Treatment: 8 Wound Status Wound Number: 6 Primary Diabetic Wound/Ulcer of the Lower Etiology: Extremity Wound Location: Right Lower Leg - Lateral, Posterior Wound Open Status: Wounding Event: Gradually Appeared Comorbid Sleep Apnea, Arrhythmia, Hypertension, Date Acquired: 11/04/2016 History: Cirrhosis , Type II Diabetes, End Stage Weeks Of Treatment: 8 Renal Disease, Gout, Osteoarthritis Clustered Wound: No Photos Photo Uploaded By: Alric Quan on 03/01/2017 16:35:11 Wound Measurements Length: (cm) 4.3 Width: (cm) 1.2 Depth: (cm) 2 Area: (cm) 4.053 Volume: (cm) 8.105 % Reduction in Area: 60.3% % Reduction in Volume: 47.1% Epithelialization: None Tunneling: No Undermining: No Wound Description Classification: Grade 2 Foul Odor Aft Wound Margin: Distinct, outline attached Slough/Fibrin Exudate Amount: Large Exudate Type: Serosanguineous Exudate Color: red, brown er Cleansing: No o No Wound Bed Granulation Amount: Large (67-100%) Granulation Quality: Red, Pink [name], Connie R. ([id number]) Necrotic Amount: Small (1-33%) Necrotic Quality: Adherent Slough Periwound Skin Texture Texture Color No Abnormalities Noted: No No Abnormalities Noted: No Erythema: Yes Moisture Erythema Location: Circumferential No Abnormalities Noted: No Maceration: Yes Temperature / Pain Temperature: No Abnormality Tenderness on Palpation: Yes Wound Preparation Ulcer Cleansing: Rinsed/Irrigated with Saline, Other: soap and water, Topical Anesthetic Applied: Other: LIDOCAINE 4%, Treatment Notes Wound #6 (Right, Lateral, Posterior Lower Leg) 1. Cleansed with: Clean wound with Normal Saline Cleanse wound with antibacterial soap and water 2. Anesthetic Topical Lidocaine 4% cream to wound bed prior to debridement 3. Peri-wound Care: Skin Prep 4. Dressing Applied: Prisma Ag 5. Secondary Dressing Applied Bordered Foam Dressing Dry Gauze Electronic  Signature(s) Signed: 03/01/2017 4:55:45 PM By: Alric Quan Entered By: Alric Quan on 03/01/2017 13:58:08 [name] ([id number]) -------------------------------------------------------------------------------- Wound Assessment Details Patient Name: [name] Date of Service: 03/01/2017 1:30 PM Medical Record Patient Account Number: [id number] Number: Treating RN: Ahmed Prima Dec 13, 1934 (83 y.o. Other Clinician: Date of Birth/Sex: Female) Treating ROBSON, MICHAEL Primary Care Cong Hightower: Emily Filbert Jaleigh Mccroskey/Extender: G Referring Romie Keeble: Melina Modena in Treatment: 8 Wound Status Wound Number: 7 Primary Diabetic Wound/Ulcer of the Lower Etiology: Extremity Wound Location: Right Malleolus - Lateral Wound Open Wounding Event: Gradually Appeared Status: Date Acquired: 01/11/2017 Comorbid Sleep Apnea, Arrhythmia,  Hypertension, Weeks Of Treatment: 7 History: Cirrhosis , Type II Diabetes, End Stage Clustered Wound: No Renal Disease, Gout, Osteoarthritis Photos Photo Uploaded By: Alric Quan on 03/01/2017 16:35:11 Wound Measurements Length: (cm) 0.2 Width: (cm) 0.2 Depth: (cm) 0.1 Area: (cm) 0.031 Volume: (cm) 0.003 % Reduction in Area: 93.8% % Reduction in Volume: 98% Epithelialization: None Tunneling: No Undermining: No Wound Description Classification: Grade 2 Foul Odor Aft Wound Margin: Distinct, outline attached Slough/Fibrin Exudate Amount: Large Exudate Type: Serous Exudate Color: amber er Cleansing: No o No Wound Bed Granulation Amount: Large (67-100%) Granulation Quality: Red [name], Connie R. ([id number]) Necrotic Amount: None Present (0%) Periwound Skin Texture Texture Color No Abnormalities Noted: No No Abnormalities Noted: No Callus: No Atrophie Blanche: No Crepitus: No Cyanosis: No Excoriation: No Ecchymosis: No Induration: No Erythema: No Rash: No Hemosiderin Staining: No Scarring: No Mottled:  No Pallor: No Moisture Rubor: No No Abnormalities Noted: No Dry / Scaly: No Temperature / Pain Maceration: No Temperature: No Abnormality Wound Preparation Ulcer Cleansing: Rinsed/Irrigated with Saline Topical Anesthetic Applied: Other: lidocaine 4%, Treatment Notes Wound #7 (Right, Lateral Malleolus) 1. Cleansed with: Clean wound with Normal Saline Cleanse wound with antibacterial soap and water 2. Anesthetic Topical Lidocaine 4% cream to wound bed prior to debridement 4. Dressing Applied: Prisma Ag 5. Secondary Dressing Applied Dry Gauze 7. Secured with Tape 3 Layer Compression System - Bilateral Electronic Signature(s) Signed: 03/01/2017 4:55:45 PM By: Alric Quan Entered By: Alric Quan on 03/01/2017 13:58:48 [name] ([id number]) -------------------------------------------------------------------------------- Wound Assessment Details Patient Name: [name] Date of Service: 03/01/2017 1:30 PM Medical Record Patient Account Number: [id number] Number: Treating RN: Ahmed Prima September 01, 1934 (83 y.o. Other Clinician: Date of Birth/Sex: Female) Treating ROBSON, [place] Primary Care Bethanne Mule: Emily Filbert Trayden Brandy/Extender: G Referring Payal Stanforth: Melina Modena in Treatment: 8 Wound Status Wound Number: 9 Primary Diabetic Wound/Ulcer of the Lower Etiology: Extremity Wound Location: Left Metatarsal head first - Lateral Wound Open Status: Wounding Event: Gradually Appeared Comorbid Sleep Apnea, Arrhythmia, Hypertension, Date Acquired: 02/01/2017 History: Cirrhosis , Type II Diabetes, End Stage Weeks Of Treatment: 4 Renal Disease, Gout, Osteoarthritis Clustered Wound: No Photos Photo Uploaded By: Alric Quan on 03/01/2017 16:35:36 Wound Measurements Length: (cm) 0.5 Width: (cm) 0.5 Depth: (cm) 0.2 Area: (cm) 0.196 Volume: (cm) 0.039 % Reduction in Area: -108.5% % Reduction in Volume: -333.3% Epithelialization:  None Tunneling: No Undermining: No Wound Description Classification: Grade 2 Foul Odor Afte Wound Margin: Distinct, outline attached Slough/Fibrino Exudate Amount: Large Exudate Type: Serosanguineous Exudate Color: red, brown r Cleansing: No No Wound Bed Granulation Amount: None Present (0%) Necrotic Amount: Large (67-100%) [name], Marlow R. ([id number]) Necrotic Quality: Eschar Periwound Skin Texture Texture Color No Abnormalities Noted: No No Abnormalities Noted: No Moisture Temperature / Pain No Abnormalities Noted: No Tenderness on Palpation: Yes Maceration: Yes Wound Preparation Ulcer Cleansing: Rinsed/Irrigated with Saline, Other: soap and water, Topical Anesthetic Applied: Other: lidocaine 4%, Treatment Notes Wound #9 (Left, Lateral Metatarsal head first) 1. Cleansed with: Clean wound with Normal Saline Cleanse wound with antibacterial soap and water 2. Anesthetic Topical Lidocaine 4% cream to wound bed prior to debridement 4. Dressing Applied: Prisma Ag 5. Secondary Dressing Applied Dry Gauze 7. Secured with Tape 3 Layer Compression System - Bilateral Electronic Signature(s) Signed: 03/01/2017 4:55:45 PM By: Alric Quan Entered By: Alric Quan on 03/01/2017 13:59:20 [name] ([id number]) -------------------------------------------------------------------------------- [place] Details Patient Name: [name] Date of Service: 03/01/2017 1:30 PM Medical Record Patient Account Number: [id number] Number: Treating RN:  Carolyne Fiscal, Debi [birth year] (83 y.o. Other Clinician: Date of Birth/Sex: Female) Treating ROBSON, MICHAEL Primary Care Cloris Flippo: Emily Filbert Quinesha Selinger/Extender: G Referring Cloris Flippo: Melina Modena in Treatment: 8 Vital Signs Time Taken: 13:37 Temperature (F): 97.5 Height (in): 61 Pulse (bpm): 101 Weight (lbs): 206 Respiratory Rate (breaths/min): 18 Body Mass Index (BMI): 38.9 Blood Pressure (mmHg):  94/47 Reference Range: 80 - 120 mg / dl Electronic Signature(s) Signed: 03/01/2017 4:55:45 PM By: Alric Quan Entered By: Alric Quan on 03/01/2017 13:37:34

## 2017-03-03 ENCOUNTER — Encounter
Admission: RE | Admit: 2017-03-03 | Discharge: 2017-03-03 | Disposition: A | Discharge status: Home / Self Care | Payer: Medicare Other | Source: Ambulatory Visit | Attending: Vascular Surgery | Admitting: Vascular Surgery

## 2017-03-03 ENCOUNTER — Ambulatory Visit
Admission: RE | Admit: 2017-03-03 | Discharge: 2017-03-03 | Disposition: A | Discharge status: Home / Self Care | Payer: Medicare Other | Source: Ambulatory Visit | Attending: Vascular Surgery | Admitting: Vascular Surgery

## 2017-03-03 DIAGNOSIS — Z0183 Encounter for blood typing: Secondary | ICD-10-CM | POA: Insufficient documentation

## 2017-03-03 DIAGNOSIS — I509 Heart failure, unspecified: Secondary | ICD-10-CM | POA: Insufficient documentation

## 2017-03-03 DIAGNOSIS — I517 Cardiomegaly: Secondary | ICD-10-CM | POA: Diagnosis not present

## 2017-03-03 DIAGNOSIS — Z01818 Encounter for other preprocedural examination: Secondary | ICD-10-CM | POA: Insufficient documentation

## 2017-03-03 DIAGNOSIS — M869 Osteomyelitis, unspecified: Secondary | ICD-10-CM | POA: Diagnosis not present

## 2017-03-03 DIAGNOSIS — R918 Other nonspecific abnormal finding of lung field: Secondary | ICD-10-CM | POA: Diagnosis not present

## 2017-03-03 DIAGNOSIS — N189 Chronic kidney disease, unspecified: Secondary | ICD-10-CM | POA: Insufficient documentation

## 2017-03-03 DIAGNOSIS — E1169 Type 2 diabetes mellitus with other specified complication: Secondary | ICD-10-CM | POA: Diagnosis not present

## 2017-03-03 DIAGNOSIS — I4891 Unspecified atrial fibrillation: Secondary | ICD-10-CM | POA: Diagnosis not present

## 2017-03-03 DIAGNOSIS — Z01812 Encounter for preprocedural laboratory examination: Secondary | ICD-10-CM | POA: Diagnosis not present

## 2017-03-03 HISTORY — DX: Gastro-esophageal reflux disease without esophagitis: K21.9

## 2017-03-03 HISTORY — DX: Unspecified asthma, uncomplicated: J45.909

## 2017-03-03 LAB — PROTIME-INR
INR: 1.01
PROTHROMBIN TIME: 13.3 s (ref 11.4–15.2)

## 2017-03-03 LAB — TYPE AND SCREEN
ABO/RH(D): O NEG
ANTIBODY SCREEN: NEGATIVE

## 2017-03-03 LAB — CBC WITH DIFFERENTIAL/PLATELET
BASOS ABS: 0 10*3/uL (ref 0–0.1)
BASOS PCT: 1 %
EOS ABS: 0.4 10*3/uL (ref 0–0.7)
Eosinophils Relative: 6 %
HCT: 33.3 % — ABNORMAL LOW (ref 35.0–47.0)
Hemoglobin: 10.9 g/dL — ABNORMAL LOW (ref 12.0–16.0)
LYMPHS PCT: 18 %
Lymphs Abs: 1.3 10*3/uL (ref 1.0–3.6)
MCH: 32.7 pg (ref 26.0–34.0)
MCHC: 32.8 g/dL (ref 32.0–36.0)
MCV: 99.8 fL (ref 80.0–100.0)
MONO ABS: 0.9 10*3/uL (ref 0.2–0.9)
Monocytes Relative: 12 %
Neutro Abs: 4.9 10*3/uL (ref 1.4–6.5)
Neutrophils Relative %: 63 %
PLATELETS: 151 10*3/uL (ref 150–440)
RBC: 3.33 MIL/uL — AB (ref 3.80–5.20)
RDW: 17 % — AB (ref 11.5–14.5)
WBC: 7.7 10*3/uL (ref 3.6–11.0)

## 2017-03-03 LAB — BASIC METABOLIC PANEL
Anion gap: 7 (ref 5–15)
BUN: 10 mg/dL (ref 6–20)
CALCIUM: 8.4 mg/dL — AB (ref 8.9–10.3)
CO2: 31 mmol/L (ref 22–32)
CREATININE: 2.7 mg/dL — AB (ref 0.44–1.00)
Chloride: 95 mmol/L — ABNORMAL LOW (ref 101–111)
GFR calc Af Amer: 18 mL/min — ABNORMAL LOW (ref >60)
GFR, EST NON AFRICAN AMERICAN: 15 mL/min — AB (ref >60)
Glucose, Bld: 240 mg/dL — ABNORMAL HIGH (ref 65–99)
Potassium: 3.2 mmol/L — ABNORMAL LOW (ref 3.5–5.1)
SODIUM: 133 mmol/L — AB (ref 135–145)

## 2017-03-03 LAB — SURGICAL PCR SCREEN
MRSA, PCR: POSITIVE — AB
Staphylococcus aureus: POSITIVE — AB

## 2017-03-03 LAB — APTT: aPTT: 38 seconds — ABNORMAL HIGH (ref 24–36)

## 2017-03-03 NOTE — Pre-Procedure Instructions (Signed)
REQUEST FOR EKG REVIEW,AS INSTRUCTED BY DR J ADAMS , CALLED AND FAXED TO DR Chicot. ALSO NOTIFIED April AT DR DEW'S

## 2017-03-03 NOTE — Patient Instructions (Signed)
  Your procedure is scheduled on: Thursday March 29 , 2018. Report to Same Day Surgery. To find out your arrival time please call [phone] between 1PM - 3PM on Wednesday March 09, 2018.  Remember: Instructions that are not followed completely may result in serious medical risk, up to and including death, or upon the discretion of your surgeon and anesthesiologist your surgery may need to be rescheduled.    _x___ 1. Do not eat food or drink liquids after midnight. No gum chewing or hard candies.     ____ 2. No Alcohol for 24 hours before or after surgery.   ____ 3. Bring all medications with you on the day of surgery if instructed.    __x__ 4. Notify your doctor if there is any change in your medical condition     (cold, fever, infections).    _____ 5. No smoking 24 hours prior to surgery.     Do not wear jewelry, make-up, hairpins, clips or nail polish.  Do not wear lotions, powders, or perfumes.   Do not shave 48 hours prior to surgery. Men may shave face and neck.  Do not bring valuables to the hospital.    [facility] is not responsible for any belongings or valuables.               Contacts, dentures or bridgework may not be worn into surgery.  Leave your suitcase in the car. After surgery it may be brought to your room.  For patients admitted to the hospital, discharge time is determined by your treatment team.   Patients discharged the day of surgery will not be allowed to drive home.    Please read over the following fact sheets that you were given:   [facility] Preparing for Surgery  _x___ Take these medicines the morning of surgery with A SIP OF WATER:    1. HYDROcodone-acetaminophen (NORCO/VICODIN)  optional  2. pantoprazole (PROTONIX)   3. propranolol (INDERAL)   ____ Fleet Enema (as directed)   __x__ Use Sage wipes as directed on instruction sheet  _x___ Use Duoneb on the day of surgery and bring to hospital day of surgery  ____ Stop metformin 2 days  prior to surgery    _x___ Take 1/2 of usual insulin dose the night before surgery and none on the morning of surgery.   ____ Stop Coumadin/Plavix/aspirin on does not apply.  ____ Stop Anti-inflammatories such as Advil, Aleve, Ibuprofen, Motrin, Naproxen,  Naprosyn, Goodies powders or aspirin products. OK to take Tylenol.   ____ Stop supplements until after surgery.    _x___ Bring C-Pap to the hospital.

## 2017-03-07 NOTE — Pre-Procedure Instructions (Signed)
Medical clearance received and placed on chart, risk assessment low.

## 2017-03-08 ENCOUNTER — Encounter: Payer: Medicare Other | Admitting: Internal Medicine

## 2017-03-09 NOTE — Progress Notes (Signed)
[name], [name] ([id number]) Visit Report for 03/08/2017 Chief Complaint Document Details Patient Name: [name], [name] Date of Service: 03/08/2017 12:30 PM Medical Record Patient Account Number: [id number] Number: Treating RN: Ahmed Prima 1934-06-07 (83 y.o. Other Clinician: Date of Birth/Sex: Female) Treating [name] Primary Care Provider: Emily Filbert Provider/Extender: G Referring Provider: Melina Modena in Treatment: 9 Information Obtained from: Patient Chief Complaint 01/04/17; patient is here for review of wounds on her bilateral lower extremities referred from her primary physician Dr. Emily Fil[facility] Electronic Signature(s) Signed: 03/09/2017 7:58:45 AM By: Linton Ham MD Entered By: Linton Ham on 03/08/2017 13:42:28 [name] ([id number]) -------------------------------------------------------------------------------- Debridement Details Patient Name: [name] Date of Service: 03/08/2017 12:30 PM Medical Record Patient Account Number: [id number] Number: Treating RN: Ahmed Prima 1934-07-20 (83 y.o. Other Clinician: Date of Birth/Sex: Female) Treating Chrysten Woulfe Primary Care Provider: Emily Filbert Provider/Extender: G Referring Provider: Melina Modena in Treatment: 9 Debridement Performed for Wound #2 Right,Dorsal Foot Assessment: Performed By: Physician Ricard Dillon, MD Debridement: Debridement Pre-procedure Yes - 13:07 Verification/Time Out Taken: Start Time: 13:08 Pain Control: Lidocaine 4% Topical Solution Level: Skin/Subcutaneous Tissue Total Area Debrided (L x 5.3 (cm) x 2.5 (cm) = 13.25 (cm) W): Tissue and other Viable, Non-Viable, Exudate, Fibrin/Slough, Subcutaneous, Tendon material debrided: Instrument: Blade, Forceps Bleeding: Large Hemostasis Achieved: Silver Nitrate End Time: 13:12 Procedural Pain: 0 Post Procedural Pain: 0 Response to Treatment:  Procedure was tolerated well Post Debridement Measurements of Total Wound Length: (cm) 5.3 Width: (cm) 2.5 Depth: (cm) 0.4 Volume: (cm) 4.163 Character of Wound/Ulcer Post Requires Further Debridement Debridement: Severity of Tissue Post Debridement: Fat layer exposed Post Procedure Diagnosis Same as Pre-procedure Electronic Signature(s) Signed: 03/08/2017 5:34:36 PM By: Alric Quan Signed: 03/09/2017 7:58:45 AM By: Linton Ham MD [name] ([id number]) Entered By: Linton Ham on 03/08/2017 [name], [name] ([id number]) -------------------------------------------------------------------------------- HPI Details Patient Name: [name] Date of Service: 03/08/2017 12:30 PM Medical Record Patient Account Number: [id number] Number: Treating RN: Ahmed Prima 01/20/1934 (83 y.o. Other Clinician: Date of Birth/Sex: Female) Treating Blen Ransome Primary Care Provider: Emily Filbert Provider/Extender: G Referring Provider: Melina Modena in Treatment: 9 History of Present Illness HPI Description: 01/04/17; this is a 83 year old fairly disabled woman who comes accompanied by her husband today. She is here for review of wounds on her bilateral feet and right calf. There is a hopeful note from Dr. Sabra Heck that is included. The patient is a type II diabetic with end-stage renal failure on dialysis, she is here for bilateral wounds in her feet and heels as well as the right calf wound posteriorly. The history here is a bit difficult to follow. Her husband states that she had a fracture of her leg in October. Indeed looking through [facility] she had a closed nondisplaced fracture of the lateral condyle of the right femur. This was managed nonoperatively with a knee brace. Her husband thinks that the brace itself caused the wounds on the right leg although that would not of caused ones on the left. Apparently the wounds have been there for 2  months and the patient is being followed by [facility] with bilateral Unna boots. She is also followed in Dr. Ozella Almond office of vascular surgery. She had recent arterial studies that showed biphasic waveforms bilaterally with a ABI on the right of 1.01 on the left at 0.93. T ABIs on the right at 0.72 and on the left at  0.75. He was not felt to have significant arterial disease. I am also not clear what [facility] was primarily dressing the wounds with under the wraps the patient had an admission on 10/17 with an acute right leg DVT status post IVC filter placement and was placed on Eliquis for 6 weeks only. 01/11/17; the x-rays that I ordered last week on this patient have been completed the left is the problem area. She is had a previous distal tib-fib fracture which appears to be better than the last imaging view I can find in September. She appears to have osteomyelitis of the lateral aspect of the talus. Left foot showed bone resorption and loss of cortical Along the medial margin of the navicular and medial cuneiform consistent with on the osteomyelitis. rRght foot and ankle showed diffuse soft tissue swelling without obvious focal bone destruction there was a suggestion of a right fourth metatarsal fracture. boen I actually tried to get more history out of the patient and her husband who is present I've also spoken to her son on the phone. It would appear that the deep area on the lateral aspect of her right knee was abrasive injury also the area on the lateral aspect just above the ankle. The she also probably consistent with pressure ulcers and dorsal ankle extensive wounds probably wrap injuries although I am guessing about some of this. She does not have an arterial issue per Dr. dew. Her left tib-fib fracture was apparently in April as well she had a left femur fracture sometime in 2014 2015. She has a rod in her left femur hopefully we can get her through an MRI. I looked  through [facility] back to 2009 I don't actually see an x-ray of the left femur. 01/18/17; her MRI is booked for February 13. We had some trouble with home health and the dressings that were prescribed so they left the current wrap on all week. There is increasing erythema on the medial aspect of the right foot compatible with cellulitis she will need an antibiotic [name], [name] ([id number]) 01/25/17; patient's MRI is actually booked for February 15 not used Dragon. This is a patient that has multiple bilateral lower extremity wounds. I have never been completely certain how these happened and in fact they may be multifactorial. I have suspected that the area on her right lateral upper leg, right lateral lower leg were probably brace injuries. She has 2 large wounds with exposed tendon on her dorsal feet bilaterally. I suspect these were wrap injuries. She has pressure ulcers on her bilateral heels. She does not have significant arterial insufficiency and is previously been seen by Dr.Dew. A plain x-ray of her left foot suggested osteomyelitis which is the reason for the MRI. I gave her doxycycline last week for left foot cellulitis which looks better. 02/01/17; her MRI of the left foot showed diffuse cellulitis and mild fasciitis without drainable soft tissue abscess or pyomyositis. She does however have osteomyelitis involving the medial navicular bone, proximal aspect of the medial cuneiform medially and also possible involvement of the distal medial aspect of the talus. The left foot continues to have a large wound on the anterior foot with exposed tendon and a unstageable wound on the right heel covered in a thick black eschar. Extensive discussion with the patient today. Probably the better option is a left BKA. She is nonambulatory. With regards to her right foot and leg the wounds are stable. There is necrotic tendon in the right  anterior dorsal foot, exposed tendon in the right  Achilles area and a very deep wound with exposed tendon on the lateral aspect of her right knee. Nevertheless all of these wounds looks somewhat viable to me. I did not MRI of the right leg as the x-ray did not show evidence of osteomyelitis 02/08/17; with regards to her right foot including the dorsal foot and the right heel this is about the same. MRI shows underlying osteomyelitis again an extensive discussion about amputation the patient still seems ambivalent. The areas over the left dorsal foot left medial ankle and the deep wound over the right lateral knee are stable to improved. Given her reasonably normal vascularity I think this foot and limb or potentially salvageable. X-ray of her foot on the right did not show osteomyelitis. I have urged her to consider a left BKA 02/15/17 patient returns today. Saw Dr. dew this morning. Apparently the understanding that the patient has is that there is no urgency to the amputation. While this is technically true the patient has underlying osteomyelitis with extensive tissue loss in her left foot. I have not actually talked to Dr. Lucky Cowboy or seen his note and I'll wait to look at this and call him if necessary. Meantime there is no major change to the wounds which in this case actually is probably a good thing since there is high risk of deterioration in this nonambulatory woman 02/22/17; I spoke to Dr. dew last week. We agreed that that she would need a left BKA. He is concerned about the nonhealing risk etc. Nevertheless there is too much tissue damage between the left heel wound, left dorsal foot wound, and the underlying osteomyelitis. She arrives today short of breath and wheezing. 03/01/17; patient is scheduled for a left BKA on 03/10/17; this is related to underlying osteomyelitis in her foot, severe wounds on the left dorsal foot, and large necrotic wound over the left heel covered with an eschar. There was some purulent drainage the came out last  week from the left heel. This cultured a very drug- resistant MRSA and enterococcus. Wouldn't have been a good combination to try and address orally in this penicillin allergic patient. Fortunately she does not appear to have any symptoms in this area there is no drainage today. I didn't really see the need for any additional antibiotics. Problematically she has now an ulcer with a necrotic surface in her right cheek area. I had noticed this a week or 2 ago, her husband gave me the explanation of the CPAP mask irritation and it seemed reasonable at the time. This is now looking much worse 03/08/17; Arrives not looking well. BKA on the left on 3/29.I suspect she will go to SNF post op. Electronic Signature(s) Signed: 03/09/2017 7:58:45 AM By: Linton Ham MD Entered By: Linton Ham on 03/08/2017 13:47:04 [name] ([id number]) [name], [name] ([id number]) -------------------------------------------------------------------------------- Physical Exam Details Patient Name: [name] Date of Service: 03/08/2017 12:30 PM Medical Record Patient Account Number: [id number] Number: Treating RN: Ahmed Prima [birth year] (83 y.o. Other Clinician: Date of Birth/Sex: Female) Treating Becket Wecker Primary Care Provider: Emily Filbert Provider/Extender: G Referring Provider: Melina Modena in Treatment: 9 Constitutional Patient is hypotensive.. Pulse regular and within target range for patient.Marland Kitchen Respirations regular, non-labored and within target range.. Temperature is normal and within the target range for the patient.. The patient does not look particularly well although not in any distress.. Eyes Conjunctivae clear. No discharge.. Integumentary (Hair, Skin) I remain  concerned about the area on her right cheek although it looks somewhat better than last week. Notes Wound exam; the area on the right dorsal foot and ankle had necrotic tendon which I debrided with  the pickup and #5 curet. I was hoping that there would be viable surface under here but really there is nothing but more exposed tendon. She continues to have an open area on the medial right heel no substantial area on the medial right knee. Circumferentially around the knee is what looks to be tinea. oThe left heel is painful and draining with a substantial open area over the dorsal foot Electronic Signature(s) Signed: 03/09/2017 7:58:45 AM By: Linton Ham MD Entered By: Linton Ham on 03/08/2017 15:27:43 [name] ([id number]) -------------------------------------------------------------------------------- Physician Orders Details Patient Name: [name] Date of Service: 03/08/2017 12:30 PM Medical Record Patient Account Number: [id number] Number: Treating RN: Ahmed Prima 1934-01-22 (83 y.o. Other Clinician: Date of Birth/Sex: Female) Treating Zac Torti Primary Care Provider: Emily Filbert Provider/Extender: G Referring Provider: Melina Modena in Treatment: 9 Verbal / Phone Orders: Yes Clinician: Carolyne Fiscal, Debi Read Back and Verified: Yes Diagnosis Coding Wound Cleansing Wound #1 Left,Dorsal Foot o Clean wound with Normal Saline. o Cleanse wound with mild soap and water Wound #2 Right,Dorsal Foot o Clean wound with Normal Saline. o Cleanse wound with mild soap and water Wound #3 Left Calcaneus o Clean wound with Normal Saline. o Cleanse wound with mild soap and water Wound #4 Right Calcaneus o Clean wound with Normal Saline. o Cleanse wound with mild soap and water Wound #6 Right,Lateral,Posterior Lower Leg o Clean wound with Normal Saline. o Cleanse wound with mild soap and water Wound #9 Left,Lateral Metatarsal head first o Clean wound with Normal Saline. o Cleanse wound with mild soap and water Anesthetic Wound #1 Left,Dorsal Foot o Topical Lidocaine 4% cream applied to wound bed prior to  debridement - for clinic use only Wound #2 Right,Dorsal Foot o Topical Lidocaine 4% cream applied to wound bed prior to debridement - for clinic use only Wound #3 Left Calcaneus o Topical Lidocaine 4% cream applied to wound bed prior to debridement - for clinic use only Wound #4 Right Calcaneus [name], Connie R. ([id number]) o Topical Lidocaine 4% cream applied to wound bed prior to debridement - for clinic use only Wound #6 Right,Lateral,Posterior Lower Leg o Topical Lidocaine 4% cream applied to wound bed prior to debridement - for clinic use only Wound #9 Left,Lateral Metatarsal head first o Topical Lidocaine 4% cream applied to wound bed prior to debridement - for clinic use only Skin Barriers/Peri-Wound Care Wound #6 Right,Lateral,Posterior Lower Leg o Skin Prep o Antifungal cream - around the wound Primary Wound Dressing Wound #1 Left,Dorsal Foot o Iodoflex Wound #2 Right,Dorsal Foot o Prisma Ag - moisten with saline Wound #3 Left Calcaneus o Medihoney gel Wound #4 Right Calcaneus o Prisma Ag - moisten with saline Wound #6 Right,Lateral,Posterior Lower Leg o Aquacel Ag Wound #9 Left,Lateral Metatarsal head first o Iodoflex Secondary Dressing Wound #1 Left,Dorsal Foot o ABD pad o Dry Gauze Wound #2 Right,Dorsal Foot o ABD pad o Dry Gauze Wound #3 Left Calcaneus o Dry Gauze o Other - heel cup Wound #4 Right Calcaneus o Dry Gauze o Other - heel cup [name], Connie R. ([id number]) Wound #6 Right,Lateral,Posterior Lower Leg o Dry Gauze - please place one under the BFD o Boardered Foam Dressing Wound #9 Left,Lateral Metatarsal head first o ABD pad o Dry Gauze Dressing Change  Frequency Wound #1 Left,Dorsal Foot o Three times weekly - pt will have this changed on Tuesdays in the [facility] Wound #2 Right,Dorsal Foot o Three times weekly - pt will have this changed on Tuesdays in the [place] #3 Left  Calcaneus o Three times weekly - pt will have this changed on Tuesdays in the [facility] Wound #4 Right Calcaneus o Three times weekly - pt will have this changed on Tuesdays in the [facility] Wound #6 Right,Lateral,Posterior Lower Leg o Three times weekly - pt will have this changed on Tuesdays in the [facility] Wound #9 Left,Lateral Metatarsal head first o Three times weekly - pt will have this changed on Tuesdays in the [facility] Follow-up Appointments Wound #1 Left,Dorsal Foot o Return Appointment in 1 week. Wound #2 Right,Dorsal Foot o Return Appointment in 1 week. Wound #3 Left Calcaneus o Return Appointment in 1 week. Wound #4 Right Calcaneus o Return Appointment in 1 week. Wound #6 Right,Lateral,Posterior Lower Leg o Return Appointment in 1 week. Wound #9 Left,Lateral Metatarsal head first o Return Appointment in 1 week. Edema Control GLORIANA, PILTZ R. ([id number]) Wound #1 Left,Dorsal Foot o 3 Layer Compression System - Bilateral o Elevate legs to the level of the heart and pump ankles as often as possible Wound #2 Right,Dorsal Foot o 3 Layer Compression System - Bilateral o Elevate legs to the level of the heart and pump ankles as often as possible Wound #3 Left Calcaneus o 3 Layer Compression System - Bilateral o Elevate legs to the level of the heart and pump ankles as often as possible Wound #4 Right Calcaneus o 3 Layer Compression System - Bilateral o Elevate legs to the level of the heart and pump ankles as often as possible Wound #6 Right,Lateral,Posterior Lower Leg o 3 Layer Compression System - Bilateral o Elevate legs to the level of the heart and pump ankles as often as possible Off-Loading Wound #1 Left,Dorsal Foot o Turn and reposition every 2 hours o Other: - float heels above heart level while in the bed Wound #2 Right,Dorsal Foot o Turn and reposition every 2 hours o Other: -  float heels above heart level while in the bed Wound #3 Left Calcaneus o Turn and reposition every 2 hours o Other: - float heels above heart level while in the bed Wound #4 Right Calcaneus o Turn and reposition every 2 hours o Other: - float heels above heart level while in the bed Wound #6 Right,Lateral,Posterior Lower Leg o Turn and reposition every 2 hours o Other: - float heels above heart level while in the bed Additional Orders / Instructions Wound #1 Left,Dorsal Foot o Increase protein intake. Wound #2 Right,Dorsal Foot o Increase protein intake. [name], Connie R. ([id number]) Wound #3 Left Calcaneus o Increase protein intake. Wound #4 Right Calcaneus o Increase protein intake. Wound #6 Right,Lateral,Posterior Lower Leg o Increase protein intake. Home Health Wound #1 [place] Visits o Home Health Nurse may visit PRN to address patientos wound care needs. o FACE TO FACE ENCOUNTER: MEDICARE and MEDICAID PATIENTS: I certify that this patient is under my care and that I had a face-to-face encounter that meets the physician face-to-face encounter requirements with this patient on this date. The encounter with the patient was in whole or in part for the following MEDICAL CONDITION: (primary reason for [place]) MEDICAL NECESSITY: I certify, that based on my findings, NURSING services are  a medically necessary home health service. HOME BOUND STATUS: I certify that my clinical findings support that this patient is homebound (i.e., Due to illness or injury, pt requires aid of supportive devices such as crutches, cane, wheelchairs, walkers, the use of special transportation or the assistance of another person to leave their place of residence. There is a normal inability to leave the home and doing so requires considerable and taxing effort. Other absences are for medical reasons / religious services and are infrequent or  of short duration when for other reasons). o If current dressing causes regression in wound condition, may D/C ordered dressing product/s and apply Normal Saline Moist Dressing daily until next [place] / Other MD appointment. [place] of regression in wound condition at [phone]. o Please direct any NON-WOUND related issues/requests for orders to patient's Primary Care Physician Wound #2 [place] Nurse may visit PRN to address patientos wound care needs. o FACE TO FACE ENCOUNTER: MEDICARE and MEDICAID PATIENTS: I certify that this patient is under my care and that I had a face-to-face encounter that meets the physician face-to-face encounter requirements with this patient on this date. The encounter with the patient was in whole or in part for the following MEDICAL CONDITION: (primary reason for [place]) MEDICAL NECESSITY: I certify, that based on my findings, NURSING services are a medically necessary home health service. HOME BOUND STATUS: I certify that my clinical findings support that this patient is homebound (i.e., Due to illness or injury, pt requires aid of supportive devices such as crutches, cane, wheelchairs, walkers, the use of special transportation or the assistance of another person to leave their place of residence. There is a normal inability to leave the home and doing so requires considerable and taxing effort. Other absences are for medical reasons / religious services and are infrequent or of short duration when for other reasons). o If current dressing causes regression in wound condition, may D/C ordered dressing product/s and apply Normal Saline Moist Dressing daily until next [place] / Other MD appointment. [place] of regression in wound condition at [phone]. ANINA, SCHNAKE ([id number]) o Please direct any NON-WOUND  related issues/requests for orders to patient's Primary Care Physician Wound #3 Left [place] Nurse may visit PRN to address patientos wound care needs. o FACE TO FACE ENCOUNTER: MEDICARE and MEDICAID PATIENTS: I certify that this patient is under my care and that I had a face-to-face encounter that meets the physician face-to-face encounter requirements with this patient on this date. The encounter with the patient was in whole or in part for the following MEDICAL CONDITION: (primary reason for [place]) MEDICAL NECESSITY: I certify, that based on my findings, NURSING services are a medically necessary home health service. HOME BOUND STATUS: I certify that my clinical findings support that this patient is homebound (i.e., Due to illness or injury, pt requires aid of supportive devices such as crutches, cane, wheelchairs, walkers, the use of special transportation or the assistance of another person to leave their place of residence. There is a normal inability to leave the home and doing so requires considerable and taxing effort. Other absences are for medical reasons / religious services and are infrequent or of short duration when for other reasons). o If current dressing causes regression in wound condition, may D/C ordered dressing product/s and apply Normal Saline Moist  Dressing daily until next [place] / Other MD appointment. [place] of regression in wound condition at [phone]. o Please direct any NON-WOUND related issues/requests for orders to patient's Primary Care Physician Wound #4 Right [place] Nurse may visit PRN to address patientos wound care needs. o FACE TO FACE ENCOUNTER: MEDICARE and MEDICAID PATIENTS: I certify that this patient is under my care and that I had a face-to-face encounter that meets the physician  face-to-face encounter requirements with this patient on this date. The encounter with the patient was in whole or in part for the following MEDICAL CONDITION: (primary reason for [place]) MEDICAL NECESSITY: I certify, that based on my findings, NURSING services are a medically necessary home health service. HOME BOUND STATUS: I certify that my clinical findings support that this patient is homebound (i.e., Due to illness or injury, pt requires aid of supportive devices such as crutches, cane, wheelchairs, walkers, the use of special transportation or the assistance of another person to leave their place of residence. There is a normal inability to leave the home and doing so requires considerable and taxing effort. Other absences are for medical reasons / religious services and are infrequent or of short duration when for other reasons). o If current dressing causes regression in wound condition, may D/C ordered dressing product/s and apply Normal Saline Moist Dressing daily until next [place] / Other MD appointment. [place] of regression in wound condition at [phone]. o Please direct any NON-WOUND related issues/requests for orders to patient's Primary Care Physician Wound #6 [place] Nurse may visit PRN to address patientos wound care needs. o FACE TO FACE ENCOUNTER: MEDICARE and MEDICAID PATIENTS: I certify that this patient is under my care and that I had a face-to-face encounter that meets the physician face-to-face ZARIYA, MINNER ([id number]) encounter requirements with this patient on this date. The encounter with the patient was in whole or in part for the following MEDICAL CONDITION: (primary reason for [place]) MEDICAL NECESSITY: I certify, that based on my findings, NURSING services are a medically necessary home health service. HOME BOUND STATUS: I  certify that my clinical findings support that this patient is homebound (i.e., Due to illness or injury, pt requires aid of supportive devices such as crutches, cane, wheelchairs, walkers, the use of special transportation or the assistance of another person to leave their place of residence. There is a normal inability to leave the home and doing so requires considerable and taxing effort. Other absences are for medical reasons / religious services and are infrequent or of short duration when for other reasons). o If current dressing causes regression in wound condition, may D/C ordered dressing product/s and apply Normal Saline Moist Dressing daily until next [place] / Other MD appointment. [place] of regression in wound condition at [phone]. o Please direct any NON-WOUND related issues/requests for orders to patient's Primary Care Physician Wound #9 Left,Lateral Metatarsal head first o [place] Nurse may visit PRN to address patientos wound care needs. o FACE TO FACE ENCOUNTER: MEDICARE and MEDICAID PATIENTS: I certify that this patient is under my care and that I had a face-to-face encounter that meets the physician face-to-face encounter requirements with this patient on this date. The encounter with the patient was in whole or in part for the  following MEDICAL CONDITION: (primary reason for Home Healthcare) MEDICAL NECESSITY: I certify, that based on my findings, NURSING services are a medically necessary home health service. HOME BOUND STATUS: I certify that my clinical findings support that this patient is homebound (i.e., Due to illness or injury, pt requires aid of supportive devices such as crutches, cane, wheelchairs, walkers, the use of special transportation or the assistance of another person to leave their place of residence. There is a normal inability to leave the home and doing so requires  considerable and taxing effort. Other absences are for medical reasons / religious services and are infrequent or of short duration when for other reasons). o If current dressing causes regression in wound condition, may D/C ordered dressing product/s and apply Normal Saline Moist Dressing daily until next [place] / Other MD appointment. [place] of regression in wound condition at [phone]. o Please direct any NON-WOUND related issues/requests for orders to patient's Primary Care Physician Medications-please add to medication list. Wound #1 Left,Dorsal Foot o Other: - Vitamin C, Zinc, MVI Wound #2 Right,Dorsal Foot o Other: - Vitamin C, Zinc, MVI Wound #3 Left Calcaneus o Other: - Vitamin C, Zinc, MVI Wound #4 Right Calcaneus o Other: - Vitamin C, Zinc, MVI Szafranski, Farrell R. ([id number]) Wound #6 Right,Lateral,Posterior Lower Leg o Other: - Vitamin C, Zinc, MVI Antifungal Cream Wound #9 Left,Lateral Metatarsal head first o Other: - Vitamin C, Zinc, MVI Electronic Signature(s) Signed: 03/08/2017 5:34:36 PM By: Alric Quan Signed: 03/09/2017 7:58:45 AM By: Linton Ham MD Entered By: Alric Quan on 03/08/2017 15:31:10 [name] ([id number]) -------------------------------------------------------------------------------- Problem List Details Patient Name: [name] Date of Service: 03/08/2017 12:30 PM Medical Record Patient Account Number: [id number] Number: Treating RN: Ahmed Prima [birth year] (83 y.o. 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 01/04/2017  Yes muscle involvement without evidence of necrosis L97.515 Non-pressure chronic ulcer of other part of right foot with 01/04/2017 Yes muscle involvement without evidence of necrosis L89.613 Pressure ulcer of right heel, stage 3 01/04/2017 Yes L89.620 Pressure ulcer of left heel, unstageable 01/04/2017 Yes L97.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 Other Clinician: Date of Birth/Sex: Female) Treating Mikolaj Woolstenhulme Primary Care Provider: Emily Filbert Provider/Extender: G Referring Provider: Melina Modena in Treatment: 9 Subjective Chief Complaint Information obtained from Patient 01/04/17; patient is here for review of wounds on her bilateral lower extremities referred from her primary physician Dr. Emily [facility] History of Present Illness (HPI) 01/04/17; this is a 83 year old fairly disabled woman who comes accompanied by her husband today. She is here for review of wounds on her bilateral feet and right calf. There is a hopeful note from Dr. Sabra Heck that is included. 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 The history here is a bit difficult to follow. Her husband states that she had a fracture of her leg in  October. Indeed looking through [facility] she had a closed nondisplaced fracture of the lateral condyle of the right femur. This was managed nonoperatively with a knee brace. Her husband thinks that the brace itself caused the wounds on the right leg although that would not of caused ones on the left. Apparently the wounds have been there for 2 months and the patient is being followed by [facility] with bilateral Unna boots. She is also followed in Dr. Ozella Almond office of vascular surgery. She had recent arterial studies that showed biphasic waveforms bilaterally with a ABI on the right of 1.01 on the left at 0.93. T ABIs on the right at 0.72 and on the left at 0.75. He was not felt to have significant arterial disease. I am also not clear what [facility] was primarily dressing the wounds with under the wraps the patient had an admission on 10/17 with an acute right leg DVT status post IVC filter placement and was placed on Eliquis for 6 weeks only. 01/11/17; the x-rays that I ordered last week on this patient have been completed the left is the problem area. She is had a previous distal tib-fib fracture which appears to be better than the last imaging view I can find in September. She appears to have osteomyelitis of the lateral aspect of the talus. Left foot showed bone resorption and loss of cortical Along the medial margin of the navicular and medial cuneiform consistent with on the osteomyelitis. rRght foot and ankle showed diffuse soft tissue swelling without obvious focal bone destruction there was a suggestion of a right fourth metatarsal fracture. boen I actually tried to get more history out of the patient and her husband who is present I've also spoken to her son on the phone. It would appear that the deep area on the lateral aspect of her right knee was abrasive injury also the area on the lateral aspect just above the ankle. The she also probably consistent  with pressure ulcers and dorsal ankle extensive wounds probably wrap injuries although I am guessing about some of this. She does not have an arterial issue per Dr. dew. Her left tib-fib fracture was apparently in April [place], [place] ([id number]) as well she had a left femur fracture sometime in 2014 2015. She has a rod in her left femur hopefully we can get her through an MRI. I looked [facility] back to 2009 I don't actually see an x-ray of the left femur. 01/18/17; her MRI is booked for February 13. We had some trouble with home health and the dressings that were prescribed so they left the current wrap on all week. There is increasing erythema on the medial aspect of the right foot compatible with cellulitis she will need an antibiotic 01/25/17; patient's MRI is actually booked for February 15 not used Dragon. This is a patient that has multiple bilateral lower extremity wounds. I have never been completely certain how these happened and in fact they may be multifactorial. I have suspected that the area on her right lateral upper leg, right lateral lower leg were probably brace injuries. She has 2 large wounds with exposed tendon on her dorsal feet bilaterally. I suspect these were wrap injuries. She  has pressure ulcers on her bilateral heels. She does not have significant arterial insufficiency and is previously been seen by Dr.Dew. A plain x-ray of her left foot suggested osteomyelitis which is the reason for the MRI. I gave her doxycycline last week for left foot cellulitis which looks better. 02/01/17; her MRI of the left foot showed diffuse cellulitis and mild fasciitis without drainable soft tissue abscess or pyomyositis. She does however have osteomyelitis involving the medial navicular bone, proximal aspect of the medial cuneiform medially and also possible involvement of the distal medial aspect of the talus. The left foot continues to have a large wound on the anterior  foot with exposed tendon and a unstageable wound on the right heel covered in a thick black eschar. Extensive discussion with the patient today. Probably the better option is a left BKA. She is nonambulatory. With regards to her right foot and leg the wounds are stable. There is necrotic tendon in the right anterior dorsal foot, exposed tendon in the right Achilles area and a very deep wound with exposed tendon on the lateral aspect of her right knee. Nevertheless all of these wounds looks somewhat viable to me. I did not MRI of the right leg as the x-ray did not show evidence of osteomyelitis 02/08/17; with regards to her right foot including the dorsal foot and the right heel this is about the same. MRI shows underlying osteomyelitis again an extensive discussion about amputation the patient still seems ambivalent. The areas over the left dorsal foot left medial ankle and the deep wound over the right lateral knee are stable to improved. Given her reasonably normal vascularity I think this foot and limb or potentially salvageable. X-ray of her foot on the right did not show osteomyelitis. I have urged her to consider a left BKA 02/15/17 patient returns today. Saw Dr. dew this morning. Apparently the understanding that the patient has is that there is no urgency to the amputation. While this is technically true the patient has underlying osteomyelitis with extensive tissue loss in her left foot. I have not actually talked to Dr. Lucky Cowboy or seen his note and I'll wait to look at this and call him if necessary. Meantime there is no major change to the wounds which in this case actually is probably a good thing since there is high risk of deterioration in this nonambulatory woman 02/22/17; I spoke to Dr. dew last week. We agreed that that she would need a left BKA. He is concerned about the nonhealing risk etc. Nevertheless there is too much tissue damage between the left heel wound, left dorsal foot  wound, and the underlying osteomyelitis. She arrives today short of breath and wheezing. 03/01/17; patient is scheduled for a left BKA on 03/10/17; this is related to underlying osteomyelitis in her foot, severe wounds on the left dorsal foot, and large necrotic wound over the left heel covered with an eschar. There was some purulent drainage the came out last week from the left heel. This cultured a very drug- resistant MRSA and enterococcus. Wouldn't have been a good combination to try and address orally in this penicillin allergic patient. Fortunately she does not appear to have any symptoms in this area there is no drainage today. I didn't really see the need for any additional antibiotics. Problematically she has now an ulcer with a necrotic surface in her right cheek area. I had noticed this a week or 2 ago, her husband gave me the explanation of the CPAP  mask irritation and it seemed reasonable [place], [place] ([id number]) at the time. This is now looking much worse 03/08/17; Arrives not looking well. BKA on the left on 3/29.I suspect she will go to SNF post op. Objective Constitutional Patient is hypotensive.. Pulse regular and within target range for patient.Marland Kitchen Respirations regular, non-labored and within target range.. Temperature is normal and within the target range for the patient.. The patient does not look particularly well although not in any distress.. Vitals Time Taken: 12:39 PM, Height: 61 in, Weight: 206 lbs, BMI: 38.9, Temperature: 98.0 F, Pulse: 101 bpm, Respiratory Rate: 18 breaths/min, Blood Pressure: 98/53 mmHg. Eyes Conjunctivae clear. No discharge.. General Notes: Wound exam; the area on the right dorsal foot and ankle had necrotic tendon which I debrided with the pickup and #5 curet. I was hoping that there would be viable surface under here but really there is nothing but more exposed tendon. She continues to have an open area on the medial right heel no substantial  area on the medial right knee. Circumferentially around the knee is what looks to be tinea. The left heel is painful and draining with a substantial open area over the dorsal foot Integumentary (Hair, Skin) I remain concerned about the area on her right cheek although it looks somewhat better than last week. Wound #1 status is Open. Original cause of wound was Gradually Appeared. The wound is located on the Left,Dorsal Foot. The wound measures 3.7cm length x 3.7cm width x 0.4cm depth; 10.752cm^2 area and 4.301cm^3 volume. There is Fat Layer (Subcutaneous Tissue) Exposed exposed. There is no tunneling or undermining noted. There is a large amount of serosanguineous drainage noted. The wound margin is well defined and not attached to the wound base. There is medium (34-66%) red, pink granulation within the wound bed. There is a medium (34-66%) amount of necrotic tissue within the wound bed including Eschar and Adherent Slough. The periwound skin appearance exhibited: Callus, Crepitus, Excoriation, Induration, Rash, Scarring, Dry/Scaly, Maceration, Atrophie Blanche, Cyanosis, Ecchymosis, Hemosiderin Staining, Mottled, Pallor, Rubor, Erythema. The surrounding wound skin color is noted with erythema which is circumferential. Periwound temperature was noted as No Abnormality. The periwound has tenderness on palpation. Wound #2 status is Open. Original cause of wound was Gradually Appeared. The wound is located on the Right,Dorsal Foot. The wound measures 5.3cm length x 2.5cm width x 0.3cm depth; 10.407cm^2 area and 3.122cm^3 volume. There is Fat Layer (Subcutaneous Tissue) Exposed exposed. There is no tunneling or undermining noted. There is a large amount of serosanguineous drainage noted. The wound margin is distinct with the outline attached to the wound base. There is small (1-33%) pink granulation within the wound bed. There is a large (67-100%) amount of necrotic tissue within the wound bed  including Eschar [place], [place] ([id number]) and Adherent Slough. The periwound skin appearance exhibited: Maceration, Erythema. The surrounding wound skin color is noted with erythema which is circumferential. Periwound temperature was noted as No Abnormality. The periwound has tenderness on palpation. Wound #3 status is Open. Original cause of wound was Pressure Injury. The wound is located on the Left Calcaneus. The wound measures 3.5cm length x 7.8cm width x 0.1cm depth; 21.441cm^2 area and 2.144cm^3 volume. There is no tunneling or undermining noted. There is a large amount of serous drainage noted. The wound margin is distinct with the outline attached to the wound base. There is no granulation within the wound bed. There is a large (67-100%) amount of necrotic tissue within the wound bed  including Eschar. The periwound skin appearance exhibited: Erythema. The surrounding wound skin color is noted with erythema which is circumferential. Periwound temperature was noted as No Abnormality. The periwound has tenderness on palpation. Wound #4 status is Open. Original cause of wound was Pressure Injury. The wound is located on the Right Calcaneus. The wound measures 2.3cm length x 1cm width x 0.2cm depth; 1.806cm^2 area and 0.361cm^3 volume. There is a large amount of serosanguineous drainage noted. The wound margin is distinct with the outline attached to the wound base. There is no granulation within the wound bed. There is a large (67-100%) amount of necrotic tissue within the wound bed including Eschar and Adherent Slough. The periwound skin appearance exhibited: Erythema. The surrounding wound skin color is noted with erythema which is circumferential. Periwound temperature was noted as No Abnormality. The periwound has tenderness on palpation. Wound #6 status is Open. Original cause of wound was Gradually Appeared. The wound is located on the Right,Lateral,Posterior Lower Leg. The wound  measures 3.8cm length x 1.2cm width x 2cm depth; 3.581cm^2 area and 7.163cm^3 volume. There is no tunneling or undermining noted. There is a large amount of serosanguineous drainage noted. The wound margin is distinct with the outline attached to the wound base. There is large (67-100%) red, pink granulation within the wound bed. There is a small (1-33%) amount of necrotic tissue within the wound bed including Adherent Slough. The periwound skin appearance exhibited: Maceration, Erythema. The surrounding wound skin color is noted with erythema which is circumferential. Periwound temperature was noted as No Abnormality. The periwound has tenderness on palpation. Wound #7 status is Open. Original cause of wound was Gradually Appeared. The wound is located on the Right,Lateral Malleolus. The wound measures 0cm length x 0cm width x 0cm depth; 0cm^2 area and 0cm^3 volume. There is no tunneling or undermining noted. There is a none present amount of drainage noted. The wound margin is distinct with the outline attached to the wound base. There is no granulation within the wound bed. There is no necrotic tissue within the wound bed. The periwound skin appearance did not exhibit: Callus, Crepitus, Excoriation, Induration, Rash, Scarring, Dry/Scaly, Maceration, Atrophie Blanche, Cyanosis, Ecchymosis, Hemosiderin Staining, Mottled, Pallor, Rubor, Erythema. Periwound temperature was noted as No Abnormality. Wound #9 status is Open. Original cause of wound was Gradually Appeared. The wound is located on the Left,Lateral Metatarsal head first. The wound measures 0.5cm length x 0.1cm width x 0.1cm depth; 0.039cm^2 area and 0.004cm^3 volume. There is no tunneling or undermining noted. There is a none present amount of drainage noted. The wound margin is distinct with the outline attached to the wound base. There is no granulation within the wound bed. There is a large (67-100%) amount of necrotic tissue within  the wound bed including Eschar. The periwound skin appearance exhibited: Maceration. The periwound has tenderness on palpation. 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  There was a Skin/Subcutaneous Tissue Debridement ([id number]) debridement with total area of 13.25 sq cm performed by Ricard Dillon, MD. with the following instrument(s): Blade and Forceps to remove Viable and Non-Viable tissue/material including Exudate, Fibrin/Slough, Tendon, and Subcutaneous after achieving pain control using Lidocaine 4% Topical Solution. A time out was conducted at 13:07, prior to the start of the procedure. A Large amount of bleeding was controlled with Silver Nitrate. The procedure was tolerated well with a pain level of 0 throughout and a pain level of 0 following the procedure. Post Debridement Measurements: 5.3cm length x 2.5cm width x 0.4cm depth; 4.163cm^3 volume. Character of Wound/Ulcer Post Debridement requires further debridement. Severity of Tissue Post Debridement is: Fat layer exposed. Post procedure Diagnosis Wound #2: Same as Pre-Procedure Plan Wound Cleansing: Wound #1 Left,Dorsal Foot: Clean wound with Normal Saline. Cleanse wound with mild soap and water Wound #2 Right,Dorsal Foot: Lorimer, Manahil R. ([id number]) Clean wound with  Normal Saline. Cleanse wound with mild soap and water Wound #3 Left Calcaneus: Clean wound with Normal Saline. Cleanse wound with mild soap and water Wound #4 Right Calcaneus: Clean wound with Normal Saline. Cleanse wound with mild soap and water Wound #6 Right,Lateral,Posterior Lower Leg: Clean wound with Normal Saline. Cleanse wound with mild soap and water Wound #9 Left,Lateral Metatarsal head first: Clean wound with Normal Saline. Cleanse wound with mild soap and water Anesthetic: Wound #1 Left,Dorsal Foot: Topical Lidocaine 4% cream applied to wound bed prior to debridement - for clinic use only Wound #2 Right,Dorsal Foot: Topical Lidocaine 4% cream applied to wound bed prior to debridement - for clinic use only Wound #3 Left Calcaneus: Topical Lidocaine 4% cream applied to wound bed prior to debridement - for clinic use only Wound #4 Right Calcaneus: Topical Lidocaine 4% cream applied to wound bed prior to debridement - for clinic use only Wound #6 Right,Lateral,Posterior Lower Leg: Topical Lidocaine 4% cream applied to wound bed prior to debridement - for clinic use only Wound #9 Left,Lateral Metatarsal head first: Topical Lidocaine 4% cream applied to wound bed prior to debridement - for clinic use only Skin Barriers/Peri-Wound Care: Wound #6 Right,Lateral,Posterior Lower Leg: Skin Prep Primary Wound Dressing: Wound #1 Left,Dorsal Foot: Iodoflex Wound #2 Right,Dorsal Foot: Prisma Ag - moisten with saline Wound #3 Left Calcaneus: Medihoney gel Wound #4 Right Calcaneus: Prisma Ag - moisten with saline Wound #6 Right,Lateral,Posterior Lower Leg: Aquacel Ag Wound #9 Left,Lateral Metatarsal head first: Iodoflex Secondary Dressing: Wound #1 Left,Dorsal Foot: ABD pad Dry Gauze Wound #2 Right,Dorsal Foot: ABD pad Dry Gauze Wound #3 Left Calcaneus: KOLETTE, VEY R. ([id number]) Dry Gauze Other - heel cup Wound #4 Right Calcaneus: Dry Gauze Other - heel cup Wound  #6 Right,Lateral,Posterior Lower Leg: Dry Gauze Boardered Foam Dressing Wound #9 Left,Lateral Metatarsal head first: ABD pad Dry Gauze Dressing Change Frequency: Wound #1 Left,Dorsal Foot: Three times weekly - pt will have this changed on Tuesdays in the [facility] Wound #2 Right,Dorsal Foot: Three times weekly - pt will have this changed on Tuesdays in the [facility] Wound #3 Left Calcaneus: Three times weekly - pt will have this changed on Tuesdays in the [place] [facility] Wound #4 Right Calcaneus: Three times weekly - pt will have this changed on Tuesdays in the [facility] Wound #6 Right,Lateral,Posterior Lower Leg: Three times weekly - pt will have this changed on Tuesdays in the [facility] Wound #9 Left,Lateral Metatarsal head first: Three times weekly - pt will  have this changed on Tuesdays in the [facility] Follow-up Appointments: Wound #1 Left,Dorsal Foot: Return Appointment in 1 week. Wound #2 Right,Dorsal Foot: Return Appointment in 1 week. Wound #3 Left Calcaneus: Return Appointment in 1 week. Wound #4 Right Calcaneus: Return Appointment in 1 week. Wound #6 Right,Lateral,Posterior Lower Leg: Return Appointment in 1 week. Wound #9 Left,Lateral Metatarsal head first: Return Appointment in 1 week. Edema Control: Wound #1 Left,Dorsal Foot: 3 Layer Compression System - Bilateral Elevate legs to the level of the heart and pump ankles as often as possible Wound #2 Right,Dorsal Foot: 3 Layer Compression System - Bilateral Elevate legs to the level of the heart and pump ankles as often as possible Wound #3 Left Calcaneus: 3 Layer Compression System - Bilateral Elevate legs to the level of the heart and pump ankles as often as possible Wound #4 Right Calcaneus: 3 Layer Compression System - Bilateral Elevate legs to the level of the heart and pump ankles as often as possible Wound #6 Right,Lateral,Posterior Lower Leg: JUSTYNA, TIMONEY R.  ([id number]) 3 Layer Compression System - Bilateral Elevate legs to the level of the heart and pump ankles as often as possible Off-Loading: Wound #1 Left,Dorsal Foot: Turn and reposition every 2 hours Other: - float heels above heart level while in the bed Wound #2 Right,Dorsal Foot: Turn and reposition every 2 hours Other: - float heels above heart level while in the bed Wound #3 Left Calcaneus: Turn and reposition every 2 hours Other: - float heels above heart level while in the bed Wound #4 Right Calcaneus: Turn and reposition every 2 hours Other: - float heels above heart level while in the bed Wound #6 Right,Lateral,Posterior Lower Leg: Turn and reposition every 2 hours Other: - float heels above heart level while in the bed Additional Orders / Instructions: Wound #1 Left,Dorsal Foot: Increase protein intake. Wound #2 Right,Dorsal Foot: Increase protein intake. Wound #3 Left Calcaneus: Increase protein intake. Wound #4 Right Calcaneus: Increase protein intake. Wound #6 Right,Lateral,Posterior Lower Leg: Increase protein intake. Home Health: Wound #1 Left,Dorsal Foot: [place] Nurse may visit PRN to address patient s wound care needs. FACE TO FACE ENCOUNTER: MEDICARE and MEDICAID PATIENTS: I certify that this patient is under my care and that I had a face-to-face encounter that meets the physician face-to-face encounter requirements with this patient on this date. The encounter with the patient was in whole or in part for the following MEDICAL CONDITION: (primary reason for [place]) MEDICAL NECESSITY: I certify, that based on my findings, NURSING services are a medically necessary home health service. HOME BOUND STATUS: I certify that my clinical findings support that this patient is homebound (i.e., Due to illness or injury, pt requires aid of supportive devices such as crutches, cane, wheelchairs, walkers, the use of special  transportation or the assistance of another person to leave their place of residence. There is a normal inability to leave the home and doing so requires considerable and taxing effort. Other absences are for medical reasons / religious services and are infrequent or of short duration when for other reasons). If current dressing causes regression in wound condition, may D/C ordered dressing product/s and apply Normal Saline Moist Dressing daily until next [place] / Other MD appointment. [place] of regression in wound condition at [phone]. Please direct any NON-WOUND related issues/requests for orders to patient's Primary Care Physician Wound #2 Right,Dorsal Foot: [place] Nurse  may visit PRN to address patient s wound care needs. FACE TO FACE ENCOUNTER: MEDICARE and MEDICAID PATIENTS: I certify that this patient is under [place], Lacrisha R. ([id number]) my care and that I had a face-to-face encounter that meets the physician face-to-face encounter requirements with this patient on this date. The encounter with the patient was in whole or in part for the following MEDICAL CONDITION: (primary reason for [place]) MEDICAL NECESSITY: I certify, that based on my findings, NURSING services are a medically necessary home health service. HOME BOUND STATUS: I certify that my clinical findings support that this patient is homebound (i.e., Due to illness or injury, pt requires aid of supportive devices such as crutches, cane, wheelchairs, walkers, the use of special transportation or the assistance of another person to leave their place of residence. There is a normal inability to leave the home and doing so requires considerable and taxing effort. Other absences are for medical reasons / religious services and are infrequent or of short duration when for other reasons). If current dressing causes regression in wound condition, may D/C  ordered dressing product/s and apply Normal Saline Moist Dressing daily until next [place] / Other MD appointment. [place] of regression in wound condition at [phone]. Please direct any NON-WOUND related issues/requests for orders to patient's Primary Care Physician Wound #3 Left Calcaneus: [place] Nurse may visit PRN to address patient s wound care needs. FACE TO FACE ENCOUNTER: MEDICARE and MEDICAID PATIENTS: I certify that this patient is under my care and that I had a face-to-face encounter that meets the physician face-to-face encounter requirements with this patient on this date. The encounter with the patient was in whole or in part for the following MEDICAL CONDITION: (primary reason for [place]) MEDICAL NECESSITY: I certify, that based on my findings, NURSING services are a medically necessary home health service. HOME BOUND STATUS: I certify that my clinical findings support that this patient is homebound (i.e., Due to illness or injury, pt requires aid of supportive devices such as crutches, cane, wheelchairs, walkers, the use of special transportation or the assistance of another person to leave their place of residence. There is a normal inability to leave the home and doing so requires considerable and taxing effort. Other absences are for medical reasons / religious services and are infrequent or of short duration when for other reasons). If current dressing causes regression in wound condition, may D/C ordered dressing product/s and apply Normal Saline Moist Dressing daily until next [place] / Other MD appointment. [place] of regression in wound condition at [phone]. Please direct any NON-WOUND related issues/requests for orders to patient's Primary Care Physician Wound #4 Right Calcaneus: [place] Nurse may visit PRN to address  patient s wound care needs. FACE TO FACE ENCOUNTER: MEDICARE and MEDICAID PATIENTS: I certify that this patient is under my care and that I had a face-to-face encounter that meets the physician face-to-face encounter requirements with this patient on this date. The encounter with the patient was in whole or in part for the following MEDICAL CONDITION: (primary reason for [place]) MEDICAL NECESSITY: I certify, that based on my findings, NURSING services are a medically necessary home health service. HOME BOUND STATUS: I certify that my clinical findings support that this patient is homebound (i.e., Due to illness or injury, pt requires aid of supportive devices such as crutches, cane, wheelchairs, walkers, the  use of special transportation or the assistance of another person to leave their place of residence. There is a normal inability to leave the home and doing so requires considerable and taxing effort. Other absences are for medical reasons / religious services and are infrequent or of short duration when for other reasons). If current dressing causes regression in wound condition, may D/C ordered dressing product/s and apply Normal Saline Moist Dressing daily until next [place] / Other MD appointment. [place] of regression in wound condition at [phone]. Please direct any NON-WOUND related issues/requests for orders to patient's Primary Care Physician Wound #6 Right,Lateral,Posterior Lower Leg: [place] Nurse may visit PRN to address patient s wound care needs. FACE TO FACE ENCOUNTER: MEDICARE and MEDICAID PATIENTS: I certify that this patient is under Fernandez, Dimond R. ([id number]) my care and that I had a face-to-face encounter that meets the physician face-to-face encounter requirements with this patient on this date. The encounter with the patient was in whole or in part for the following MEDICAL CONDITION:  (primary reason for [place]) MEDICAL NECESSITY: I certify, that based on my findings, NURSING services are a medically necessary home health service. HOME BOUND STATUS: I certify that my clinical findings support that this patient is homebound (i.e., Due to illness or injury, pt requires aid of supportive devices such as crutches, cane, wheelchairs, walkers, the use of special transportation or the assistance of another person to leave their place of residence. There is a normal inability to leave the home and doing so requires considerable and taxing effort. Other absences are for medical reasons / religious services and are infrequent or of short duration when for other reasons). If current dressing causes regression in wound condition, may D/C ordered dressing product/s and apply Normal Saline Moist Dressing daily until next [place] / Other MD appointment. [place] of regression in wound condition at [phone]. Please direct any NON-WOUND related issues/requests for orders to patient's Primary Care Physician Wound #9 Left,Lateral Metatarsal head first: [place] Nurse may visit PRN to address patient s wound care needs. FACE TO FACE ENCOUNTER: MEDICARE and MEDICAID PATIENTS: I certify that this patient is under my care and that I had a face-to-face encounter that meets the physician face-to-face encounter requirements with this patient on this date. The encounter with the patient was in whole or in part for the following MEDICAL CONDITION: (primary reason for [place]) MEDICAL NECESSITY: I certify, that based on my findings, NURSING services are a medically necessary home health service. HOME BOUND STATUS: I certify that my clinical findings support that this patient is homebound (i.e., Due to illness or injury, pt requires aid of supportive devices such as crutches, cane, wheelchairs, walkers, the use of special  transportation or the assistance of another person to leave their place of residence. There is a normal inability to leave the home and doing so requires considerable and taxing effort. Other absences are for medical reasons / religious services and are infrequent or of short duration when for other reasons). If current dressing causes regression in wound condition, may D/C ordered dressing product/s and apply Normal Saline Moist Dressing daily until next [place] / Other MD appointment. [place] of regression in wound condition at [phone]. 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 ([id number]) #2 I was hoping for a better result on the dorsal right foot it really looked this of this tendon had viable tissue underneath but there isn't. I made her an appointment for 2 weeks to follow-up with me however it is likely she will go to skilled facility. I would use silver collagen to all her wound areas on the right foot. Electronic Signature(s) Signed: 03/09/2017 7:58:45 AM By: Linton Ham MD Entered By: Linton Ham on 03/08/2017 15:28:54 [name] ([id number]) -------------------------------------------------------------------------------- [place] Details Patient Name: [name] Date of Service: 03/08/2017 Medical Record Patient Account Number: [id number] Number: Treating RN: Ahmed Prima 25-Sep-1934 (83 y.o. 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 Facility Procedures CPT4: Description Modifier Quantity Code [id number] 11042 - DEB SUBQ TISSUE 20 SQ CM/< 1 ICD-10 Description Diagnosis E11.621 Type 2 diabetes mellitus with foot ulcer L97.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

## 2017-03-09 NOTE — Progress Notes (Addendum)
PAISYN, GUERCIO ([id number]) Visit Report for 03/08/2017 Arrival Information Details Patient Name: [name], [name] Date of Service: 03/08/2017 12:30 PM Medical Record Patient Account Number: [id number] Number: Treating RN: Ahmed Prima 1934/10/02 (83 y.o. Other Clinician: Date of Birth/Sex: Female) Treating ROBSON, MICHAEL Primary Care Keishla Oyer: Emily Filbert Adeli Frost/Extender: G Referring Kerrilynn Derenzo: Melina Modena in Treatment: 9 Visit Information History Since Last Visit All ordered tests and consults were completed: No Patient Arrived: Wheel Chair Added or deleted any medications: No Arrival Time: 12:38 Any new allergies or adverse reactions: No Accompanied By: husband Signs or symptoms of abuse/neglect since last No visito Transfer Assistance: [employer] Hospitalized since last visit: No Patient Identification Verified: Yes Has Dressing in Place as Prescribed: Yes Secondary Verification Process Yes Completed: Pain Present Now: Yes Patient Requires Transmission-Based No Precautions: Patient Has Alerts: Yes Patient Alerts: DM II Electronic Signature(s) Signed: 03/08/2017 5:34:36 PM By: Alric Quan Entered By: Alric Quan on 03/08/2017 12:39:00 [name] ([id number]) -------------------------------------------------------------------------------- Encounter Discharge Information Details Patient Name: [name] Date of Service: 03/08/2017 12:30 PM Medical Record Patient Account Number: [id number] Number: Treating RN: Ahmed Prima Feb 28, 1934 (83 y.o. Other Clinician: Date of Birth/Sex: Female) Treating ROBSON, MICHAEL Primary Care Heaton Sarin: Emily Filbert Maurita Havener/Extender: G Referring Natonya Finstad: Melina Modena in Treatment: 9 Encounter Discharge Information Items Discharge Pain Level: 0 Discharge Condition: Stable Ambulatory Status: Wheelchair Discharge Destination: Home Transportation: Other Accompanied By:  husband Schedule Follow-up Appointment: Yes Medication Reconciliation completed and provided to Patient/Care No Mycala Warshawsky: Provided on Clinical Summary of Care: 03/08/2017 Form Type Recipient Paper Patient MB Electronic Signature(s) Signed: 03/08/2017 1:56:36 PM By: Ruthine Dose Entered By: Ruthine Dose on 03/08/2017 13:56:35 [name] ([id number]) -------------------------------------------------------------------------------- Lower Extremity Assessment Details Patient Name: [name] Date of Service: 03/08/2017 12:30 PM Medical Record Patient Account Number: [id number] Number: Treating RN: Ahmed Prima 29-Feb-1934 (83 y.o. Other Clinician: Date of Birth/Sex: Female) Treating ROBSON, MICHAEL Primary Care Daquawn Seelman: Emily Filbert Breanah Faddis/Extender: G Referring Dimple Bastyr: Melina Modena in Treatment: 9 Edema Assessment Assessed: [Left: No] [Right: No] E[Left: dema] [Right: :] Calf Left: Right: Point of Measurement: 31 cm From Medial Instep 45.2 cm 36.8 cm Ankle Left: Right: Point of Measurement: 8 cm From Medial Instep 27.2 cm 27.4 cm Vascular Assessment Pulses: Dorsalis Pedis Palpable: [Left:Yes] [Right:Yes] Posterior Tibial Extremity colors, hair growth, and conditions: Extremity Color: [Left:Hyperpigmented] [Right:Hyperpigmented] Temperature of Extremity: [Left:Warm] [Right:Warm] Electronic Signature(s) Signed: 03/08/2017 5:34:36 PM By: Alric Quan Entered By: Alric Quan on 03/08/2017 12:59:42 [name] ([id number]) -------------------------------------------------------------------------------- Multi Wound Chart Details Patient Name: [name] Date of Service: 03/08/2017 12:30 PM Medical Record Patient Account Number: [id number] Number: Treating RN: Ahmed Prima 06-Sep-1934 (83 y.o. Other Clinician: Date of Birth/Sex: Female) Treating ROBSON, MICHAEL Primary Care Travers Goodley: Emily Filbert Shaine Newmark/Extender:  G Referring Lalanya Rufener: Melina Modena in Treatment: 9 Vital Signs Height(in): 61 Pulse(bpm): 101 Weight(lbs): 206 Blood Pressure 98/53 (mmHg): Body Mass Index(BMI): 39 Temperature(F): 98.0 Respiratory Rate 18 (breaths/min): Photos: [1:No Photos] [2:No Photos] [3:No Photos] Wound Location: [1:Left Foot - Dorsal] [2:Right Foot - Dorsal] [3:Left Calcaneus] Wounding Event: [1:Gradually Appeared] [2:Gradually Appeared] [3:Pressure Injury] Primary Etiology: [1: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] [2:the Lower Extremity] [3:Pressure Ulcer] Comorbid History: [1:Sleep Apnea, Arrhythmia, Sleep Apnea, Arrhythmia, Hypertension, Cirrhosis , Hypertension, Cirrhosis , Type II Diabetes, End Stage Renal Disease, Gout, Osteoarthritis] [2:Type II Diabetes, End Stage Renal Disease, Gout,  Osteoarthritis] [3:Sleep Apnea, Arrhythmia, Hypertension, Cirrhosis , Type II Diabetes, End Stage Renal  Disease, Gout, Osteoarthritis] Date Acquired: [1:11/04/2016] [2:11/04/2016] [3:11/04/2016] Weeks of Treatment: [1:9] [2:9] [3:9] Wound Status: [1:Open] [2:Open] [3:Open] Measurements L x W x D 3.7x3.7x0.4 [2:5.3x2.5x0.3] [3:3.5x7.8x0.1] (cm) Area (cm) : [1:10.752] [2:10.407] [3:21.441] Volume (cm) : [1:4.301] [2:3.122] [3:2.144] % Reduction in Area: [1:23.90%] [2:44.80%] [3:24.20%] % Reduction in Volume: -52.10% [2:17.20%] [3:24.20%] Classification: [1:Grade 1] [2:Grade 2] [3:Category/Stage II] HBO Classification: [1:N/A] [2:N/A] [3:Grade 1] Exudate Amount: [1:Large] [2:Large] [3:Large] Exudate Type: [1:Serosanguineous] [2:Serosanguineous] [3:Serous] Exudate Color: [1:red, brown] [2:red, brown] [3:amber] Wound Margin: [1:Well defined, not attached Distinct, outline attached] [3:Distinct, outline attached] Granulation Amount: [1:Medium (34-66%)] [2:Small (1-33%)] [3:None Present (0%)] Granulation Quality: [1:Red, Pink] [2:Pink, Hyper-granulation] [3:N/A] Necrotic Amount:  [1:Medium (34-66%)] [2:Large (67-100%)] [3:Large (67-100%)] Necrotic Tissue: Eschar, Adherent Slough Eschar, Adherent Slough Eschar Exposed Structures: Fat Layer (Subcutaneous Fat Layer (Subcutaneous N/A Tissue) Exposed: Yes Tissue) Exposed: Yes Fascia: No Fascia: No Tendon: No Tendon: No Muscle: No Muscle: No Joint: No Joint: No Bone: No Bone: No Epithelialization: None None None Debridement: N/A Debridement ([id number]) Pre-procedure N/A 13:07 N/A Verification/Time Out Taken: Pain Control: N/A Lidocaine 4% Topical N/A Solution Tissue Debrided: N/A Fibrin/Slough, Tendon, N/A Exudates, Subcutaneous Level: N/A Skin/Subcutaneous N/A Tissue Debridement Area (sq N/A 13.25 N/A cm): Instrument: N/A Blade, Forceps N/A Bleeding: N/A Large N/A Hemostasis Achieved: N/A Silver Nitrate N/A Procedural Pain: N/A 0 N/A Post Procedural Pain: N/A 0 N/A Debridement Treatment N/A Procedure was tolerated N/A Response: well Post Debridement N/A 5.3x2.5x0.4 N/A Measurements L x W x D (cm) Post Debridement N/A 4.163 N/A Volume: (cm) Periwound Skin Texture: Excoriation: Yes No Abnormalities Noted No Abnormalities Noted Induration: Yes Callus: Yes Crepitus: Yes Rash: Yes Scarring: Yes Periwound Skin Maceration: Yes Maceration: Yes No Abnormalities Noted Moisture: Dry/Scaly: Yes Periwound Skin Color: Atrophie Blanche: Yes Erythema: Yes Erythema: Yes Cyanosis: Yes Ecchymosis: Yes Erythema: Yes Hemosiderin Staining: Yes Mottled: Yes Pallor: Yes Rubor: Yes [name], Connie R. ([id number]) Erythema Location: Circumferential Circumferential Circumferential Temperature: No Abnormality No Abnormality No Abnormality Tenderness on Yes Yes Yes Palpation: Wound Preparation: Ulcer Cleansing: Other: Ulcer Cleansing: Ulcer Cleansing: soap and water Rinsed/Irrigated with Rinsed/Irrigated with Saline Saline Topical Anesthetic Applied: Other: lidocaine Topical Anesthetic Topical  Anesthetic 4% Applied: Other: Applied: Other: LIDOCAINE 4% LIDOCAINE 4% Procedures Performed: N/A Debridement N/A Wound Number: [id number]  Photos: No Photos No Photos No Photos Wound Location: Right Calcaneus Right Lower Leg - Lateral, Right Malleolus - Lateral Posterior Wounding Event: Pressure Injury Gradually Appeared Gradually Appeared Primary Etiology: Pressure Ulcer 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 the Lower Extremity Comorbid History: Sleep Apnea, Arrhythmia, Sleep Apnea, Arrhythmia, Sleep Apnea, Arrhythmia, Hypertension, Cirrhosis , Hypertension, Cirrhosis , Hypertension, Cirrhosis , Type II Diabetes, End Type II Diabetes, End Type II Diabetes, End Stage Renal Disease, Stage Renal Disease, Stage Renal Disease, Gout, Osteoarthritis Gout, Osteoarthritis Gout, Osteoarthritis Date Acquired: 11/04/2016 11/04/2016 01/11/2017 Weeks of Treatment: 9 9 8  Wound Status: Open Open Open Measurements L x W x D 2.3x1x0.2 3.8x1.2x2 0x0x0 (cm) Area (cm) : 1.806 3.581 0 Volume (cm) : 0.361 7.163 0 % Reduction in Area: 90.80% 64.90% 100.00% % Reduction in Volume: 90.80% 53.20% 100.00% Classification: Category/Stage II Grade 2 Grade 2 HBO Classification: Grade 1 N/A N/A Exudate Amount: Large Large None Present Exudate Type: Serosanguineous Serosanguineous N/A Exudate Color: red, brown red, brown N/A Wound Margin: Distinct, outline attached Distinct, outline attached Distinct, outline attached Granulation Amount: None Present (0%) Large (67-100%) None Present (0%) Granulation Quality: N/A Red, Pink N/A Necrotic Amount: Large (67-100%) Small (1-33%)  None Present (0%) Necrotic Tissue: Eschar, Adherent Chapman N/A Exposed Structures: N/A N/A N/A Epithelialization: None None Large (67-100%) Debridement: N/A N/A N/A Pain Control: N/A N/A N/A Tissue Debrided: N/A N/A N/A [name], Connie R. ([id number]) Level: N/A N/A N/A Debridement Area (sq N/A N/A  N/A cm): Instrument: N/A N/A N/A Bleeding: N/A N/A N/A Hemostasis Achieved: N/A N/A N/A Procedural Pain: N/A N/A N/A Post Procedural Pain: N/A N/A N/A Debridement Treatment N/A N/A N/A Response: Post Debridement N/A N/A N/A Measurements L x W x D (cm) Post Debridement N/A N/A N/A Volume: (cm) Periwound Skin Texture: No Abnormalities Noted No Abnormalities Noted Excoriation: No Induration: No Callus: No Crepitus: No Rash: No Scarring: No Periwound Skin No Abnormalities Noted Maceration: Yes Maceration: No Moisture: Dry/Scaly: No Periwound Skin Color: Erythema: Yes Erythema: Yes Atrophie Blanche: No Cyanosis: No Ecchymosis: No Erythema: No Hemosiderin Staining: No Mottled: No Pallor: No Rubor: No Erythema Location: Circumferential Circumferential N/A Temperature: No Abnormality No Abnormality No Abnormality Tenderness on Yes Yes No Palpation: Wound Preparation: Ulcer Cleansing: Ulcer Cleansing: Ulcer Cleansing: Rinsed/Irrigated with Rinsed/Irrigated with Rinsed/Irrigated with Saline Saline, Other: soap and Saline water Topical Anesthetic Topical Anesthetic Applied: Other: Topical Anesthetic Applied: None LIDOCAINE 4% Applied: Other: LIDOCAINE 4% Procedures Performed: N/A N/A N/A Wound Number: 9 N/A N/A Photos: No Photos N/A N/A Wound Location: Left Metatarsal head first - N/A N/A Lateral Wounding Event: Gradually Appeared N/A N/A GABRIANNA, FASSNACHT ([id number]) Primary Etiology: Diabetic Wound/Ulcer of N/A N/A the Lower Extremity Comorbid History: Sleep Apnea, Arrhythmia, N/A N/A Hypertension, Cirrhosis , Type II Diabetes, End Stage Renal Disease, Gout, Osteoarthritis Date Acquired: 02/01/2017 N/A N/A Weeks of Treatment: 5 N/A N/A Wound Status: Open N/A N/A Measurements L x W x D 0.5x0.1x0.1 N/A N/A (cm) Area (cm) : 0.039 N/A N/A Volume (cm) : 0.004 N/A N/A % Reduction in Area: 58.50% N/A N/A % Reduction in Volume: 55.60% N/A N/A Classification:  Grade 2 N/A N/A HBO Classification: N/A N/A N/A Exudate Amount: None Present N/A N/A Exudate Type: N/A N/A N/A Exudate Color: N/A N/A N/A Wound Margin: Distinct, outline attached N/A N/A Granulation Amount: None Present (0%) N/A N/A Granulation Quality: N/A N/A N/A Necrotic Amount: Large (67-100%) N/A N/A Necrotic Tissue: Eschar N/A N/A Exposed Structures: N/A N/A N/A Epithelialization: None N/A N/A Debridement: N/A N/A N/A Pain Control: N/A N/A N/A Tissue Debrided: N/A N/A N/A Level: N/A N/A N/A Debridement Area (sq N/A N/A N/A cm): Instrument: N/A N/A N/A Bleeding: N/A N/A N/A Hemostasis Achieved: N/A N/A N/A Procedural Pain: N/A N/A N/A Post Procedural Pain: N/A N/A N/A Debridement Treatment N/A N/A N/A Response: Post Debridement N/A N/A N/A Measurements L x W x D (cm) Post Debridement N/A N/A N/A Volume: (cm) Periwound Skin Texture: No Abnormalities Noted N/A N/A Periwound Skin Maceration: Yes N/A N/A MoistureJOSSIE, Connie R. ([id number]) Periwound Skin Color: No Abnormalities Noted N/A N/A Erythema Location: N/A N/A N/A Temperature: N/A N/A N/A Tenderness on Yes N/A N/A Palpation: Wound Preparation: Ulcer Cleansing: N/A N/A Rinsed/Irrigated with Saline, Other: soap and water Topical Anesthetic Applied: Other: lidocaine 4% Procedures Performed: N/A N/A N/A Treatment Notes Electronic Signature(s) Signed: 03/09/2017 7:58:45 AM By: Linton Ham MD Entered By: Linton Ham on 03/08/2017 13:41:34 [name] ([id number]) -------------------------------------------------------------------------------- Multi-Disciplinary Care Plan Details Patient Name: [name] Date of Service: 03/08/2017 12:30 PM Medical Record Patient Account Number: [id number] Number: Treating RN: Ahmed Prima 05-28-1934 (83 y.o. Other Clinician: Date of Birth/Sex: Female) Treating ROBSON, MICHAEL Primary Care  Dorena Dorfman: Emily Filbert Raynelle Fujikawa/Extender:  G Referring Katielynn Horan: Melina Modena in Treatment: 9 Active Inactive Electronic Signature(s) Signed: 03/29/2017 4:52:28 PM By: Alric Quan Previous Signature: 03/08/2017 5:34:36 PM Version By: Alric Quan Entered By: Alric Quan on 03/29/2017 09:23:37 [name] ([id number]) -------------------------------------------------------------------------------- Pain Assessment Details Patient Name: [name] Date of Service: 03/08/2017 12:30 PM Medical Record Patient Account Number: [id number] Number: Treating RN: Ahmed Prima [birth year] (83 y.o. Other Clinician: Date of Birth/Sex: Female) Treating ROBSON, MICHAEL Primary Care Andrus Sharp: Emily Filbert Tymarion Everard/Extender: G Referring Raffaela Ladley: Melina Modena in Treatment: 9 Active Problems Location of Pain Severity and Description of Pain Patient Has Paino Yes Site Locations Pain Location: Pain in Ulcers With Dressing Change: Yes Rate the pain. Current Pain Level: 8 Character of Pain Describe the Pain: Aching, Shooting Pain Management and Medication Current Pain Management: Electronic Signature(s) Signed: 03/08/2017 5:34:36 PM By: Alric Quan Entered By: Alric Quan on 03/08/2017 12:39:33 [name] ([id number]) -------------------------------------------------------------------------------- Patient/Caregiver Education Details Patient Name: [name] Date of Service: 03/08/2017 12:30 PM Medical Record Patient Account Number: [id number] Number: Treating RN: Ahmed Prima [birth year] (83 y.o. Other Clinician: Date of Birth/Gender: Female) Treating ROBSON, [place] Primary Care Physician: Emily Filbert Physician/Extender: G Referring Physician: Melina Modena in Treatment: 9 Education Assessment Education Provided To: Patient Education Topics Provided Wound/Skin Impairment: Handouts: Other: change dressing as ordered, do not get wrap wet Methods:  Demonstration, Explain/Verbal Responses: State content correctly Electronic Signature(s) Signed: 03/08/2017 5:34:36 PM By: Alric Quan Entered By: Alric Quan on 03/08/2017 13:03:32 [name] ([id number]) -------------------------------------------------------------------------------- Wound Assessment Details Patient Name: [name] Date of Service: 03/08/2017 12:30 PM Medical Record Patient Account Number: [id number] Number: Treating RN: Ahmed Prima January 31, 1934 (83 y.o. Other Clinician: Date of Birth/Sex: Female) Treating ROBSON, [place] Primary Care Yona Stansbury: Emily Filbert Sonali Wivell/Extender: G Referring Hakan Nudelman: Melina Modena in Treatment: 9 Wound Status Wound Number: 1 Primary Diabetic Wound/Ulcer of the Lower Etiology: Extremity Wound Location: Left Foot - Dorsal Wound Open Wounding Event: Gradually Appeared Status: Date Acquired: 11/04/2016 Comorbid Sleep Apnea, Arrhythmia, Hypertension, Weeks Of Treatment: 9 History: Cirrhosis , Type II Diabetes, End Stage Clustered Wound: No Renal Disease, Gout, Osteoarthritis Photos Photo Uploaded By: Alric Quan on 03/08/2017 17:12:38 Wound Measurements Length: (cm) 3.7 Width: (cm) 3.7 Depth: (cm) 0.4 Area: (cm) 10.752 Volume: (cm) 4.301 % Reduction in Area: 23.9% % Reduction in Volume: -52.1% Epithelialization: None Tunneling: No Undermining: No Wound Description Classification: Grade 1 Foul Odor Aft Wound Margin: Well defined, not attached Slough/Fibrin Exudate Amount: Large Exudate Type: Serosanguineous Exudate Color: red, brown er Cleansing: No o Yes Wound Bed Granulation Amount: Medium (34-66%) Exposed Structure Granulation Quality: Red, Pink Fascia Exposed: No [place], Lizett R. ([id number]) Necrotic Amount: Medium (34-66%) Fat Layer (Subcutaneous Tissue) Exposed: Yes Necrotic Quality: Eschar, Adherent Slough Tendon Exposed: No Muscle Exposed: No Joint Exposed:  No Bone Exposed: No Periwound Skin Texture Texture Color No Abnormalities Noted: No No Abnormalities Noted: No Callus: Yes Atrophie Blanche: Yes Crepitus: Yes Cyanosis: Yes Excoriation: Yes Ecchymosis: Yes Induration: Yes Erythema: Yes Rash: Yes Erythema Location: Circumferential Scarring: Yes Hemosiderin Staining: Yes Mottled: Yes Moisture Pallor: Yes No Abnormalities Noted: No Rubor: Yes Dry / Scaly: Yes Maceration: Yes Temperature / Pain Temperature: No Abnormality Tenderness on Palpation: Yes Wound Preparation Ulcer Cleansing: Other: soap and water, Topical Anesthetic Applied: Other: lidocaine 4%, Electronic Signature(s) Signed: 03/08/2017 5:34:36 PM By: Alric Quan Entered By: Alric Quan on 03/08/2017 12:55:05 [name] ([id number]) -------------------------------------------------------------------------------- Wound Assessment  Details Patient Name: [name], [name] Date of Service: 03/08/2017 12:30 PM Medical Record Patient Account Number: [id number] Number: Treating RN: Ahmed Prima [birth year] (83 y.o. Other Clinician: Date of Birth/Sex: Female) Treating ROBSON, [place] Primary Care Blessed Cotham: Emily Filbert Marke Goodwyn/Extender: G Referring Palmira Stickle: Melina Modena in Treatment: 9 Wound Status Wound Number: 2 Primary Diabetic Wound/Ulcer of the Lower Etiology: Extremity Wound Location: Right Foot - Dorsal Wound Open Wounding Event: Gradually Appeared Status: Date Acquired: 11/04/2016 Comorbid Sleep Apnea, Arrhythmia, Hypertension, Weeks Of Treatment: 9 History: Cirrhosis , Type II Diabetes, End Stage Clustered Wound: No Renal Disease, Gout, Osteoarthritis Photos Photo Uploaded By: Alric Quan on 03/08/2017 17:13:53 Wound Measurements Length: (cm) 5.3 Width: (cm) 2.5 Depth: (cm) 0.3 Area: (cm) 10.407 Volume: (cm) 3.122 % Reduction in Area: 44.8% % Reduction in Volume: 17.2% Epithelialization:  None Tunneling: No Undermining: No Wound Description Classification: Grade 2 Foul Odor Aft Wound Margin: Distinct, outline attached Slough/Fibrin Exudate Amount: Large Exudate Type: Serosanguineous Exudate Color: red, brown er Cleansing: No o Yes Wound Bed Granulation Amount: Small (1-33%) Exposed Structure Granulation Quality: Pink, Hyper-granulation Fascia Exposed: No [name], Connie R. ([id number]) Necrotic Amount: Large (67-100%) Fat Layer (Subcutaneous Tissue) Exposed: Yes Necrotic Quality: Eschar, Adherent Slough Tendon Exposed: No Muscle Exposed: No Joint Exposed: No Bone Exposed: No Periwound Skin Texture Texture Color No Abnormalities Noted: No No Abnormalities Noted: No Erythema: Yes Moisture Erythema Location: Circumferential No Abnormalities Noted: No Maceration: Yes Temperature / Pain Temperature: No Abnormality Tenderness on Palpation: Yes Wound Preparation Ulcer Cleansing: Rinsed/Irrigated with Saline Topical Anesthetic Applied: Other: LIDOCAINE 4%, Electronic Signature(s) Signed: 03/08/2017 5:34:36 PM By: Alric Quan Entered By: Alric Quan on 03/08/2017 12:54:26 [name] ([id number]) -------------------------------------------------------------------------------- Wound Assessment Details Patient Name: [name] Date of Service: 03/08/2017 12:30 PM Medical Record Patient Account Number: [id number] Number: Treating RN: Ahmed Prima Apr 22, 1934 (83 y.o. Other Clinician: Date of Birth/Sex: Female) Treating ROBSON, [place] Primary Care Maleia Weems: Emily Filbert Ysabella Babiarz/Extender: G Referring Suzzane Quilter: Melina Modena in Treatment: 9 Wound Status Wound Number: 3 Primary Pressure Ulcer Etiology: Wound Location: Left Calcaneus Wound Open Wounding Event: Pressure Injury Status: Date Acquired: 11/04/2016 Comorbid Sleep Apnea, Arrhythmia, Hypertension, Weeks Of Treatment: 9 History: Cirrhosis , Type II Diabetes, End  Stage Clustered Wound: No Renal Disease, Gout, Osteoarthritis Photos Photo Uploaded By: Alric Quan on 03/08/2017 17:13:54 Wound Measurements Length: (cm) 3.5 Width: (cm) 7.8 Depth: (cm) 0.1 Area: (cm) 21.441 Volume: (cm) 2.144 % Reduction in Area: 24.2% % Reduction in Volume: 24.2% Epithelialization: None Tunneling: No Undermining: No Wound Description Classification: Category/Stage II Foul O Diabetic Severity (Wagner): Grade 1 Slough Wound Margin: Distinct, outline attached Exudate Amount: Large Exudate Type: Serous Exudate Color: amber dor After Cleansing: No /Fibrino No Wound Bed Granulation Amount: None Present (0%) [name], Connie R. ([id number]) Necrotic Amount: Large (67-100%) Necrotic Quality: Eschar Periwound Skin Texture Texture Color No Abnormalities Noted: No No Abnormalities Noted: No Erythema: Yes Moisture Erythema Location: Circumferential No Abnormalities Noted: No Temperature / Pain Temperature: No Abnormality Tenderness on Palpation: Yes Wound Preparation Ulcer Cleansing: Rinsed/Irrigated with Saline Topical Anesthetic Applied: Other: LIDOCAINE 4%, Electronic Signature(s) Signed: 03/08/2017 5:34:36 PM By: Alric Quan Entered By: Alric Quan on 03/08/2017 12:55:34 [name] ([id number]) -------------------------------------------------------------------------------- Wound Assessment Details Patient Name: [name] Date of Service: 03/08/2017 12:30 PM Medical Record Patient Account Number: [id number] Number: Treating RN: Ahmed Prima 1934-08-01 (83 y.o. Other Clinician: Date of Birth/Sex: Female) Treating ROBSON, MICHAEL Primary Care Marckus Hanover: Emily Filbert Bliss Tsang/Extender: G Referring Onita Pfluger: Sabra Heck,  Mark Weeks in Treatment: 9 Wound Status Wound Number: 4 Primary Pressure Ulcer Etiology: Wound Location: Right Calcaneus Wound Open Wounding Event: Pressure Injury Status: Date Acquired:  11/04/2016 Comorbid Sleep Apnea, Arrhythmia, Hypertension, Weeks Of Treatment: 9 History: Cirrhosis , Type II Diabetes, End Stage Clustered Wound: No Renal Disease, Gout, Osteoarthritis Photos Photo Uploaded By: Alric Quan on 03/08/2017 17:14:31 Wound Measurements Length: (cm) 2.3 Width: (cm) 1 Depth: (cm) 0.2 Area: (cm) 1.806 Volume: (cm) 0.361 % Reduction in Area: 90.8% % Reduction in Volume: 90.8% Epithelialization: None Wound Description Classification: Category/Stage II Foul O Diabetic Severity (Wagner): Grade 1 Slough Wound Margin: Distinct, outline attached Exudate Amount: Large Exudate Type: Serosanguineous Exudate Color: red, brown dor After Cleansing: No /Fibrino No Wound Bed Granulation Amount: None Present (0%) [name], Connie R. ([id number]) Necrotic Amount: Large (67-100%) Necrotic Quality: Eschar, Adherent Slough Periwound Skin Texture Texture Color No Abnormalities Noted: No No Abnormalities Noted: No Erythema: Yes Moisture Erythema Location: Circumferential No Abnormalities Noted: No Temperature / Pain Temperature: No Abnormality Tenderness on Palpation: Yes Wound Preparation Ulcer Cleansing: Rinsed/Irrigated with Saline Topical Anesthetic Applied: Other: LIDOCAINE 4%, Electronic Signature(s) Signed: 03/08/2017 5:34:36 PM By: Alric Quan Entered By: Alric Quan on 03/08/2017 12:56:06 [name] ([id number]) -------------------------------------------------------------------------------- Wound Assessment Details Patient Name: [name] Date of Service: 03/08/2017 12:30 PM Medical Record Patient Account Number: [id number] Number: Treating RN: Ahmed Prima February 06, 1934 (83 y.o. Other Clinician: Date of Birth/Sex: Female) Treating ROBSON, [place] Primary Care Sharia Averitt: Emily Filbert Valina Maes/Extender: G Referring Shalamar Plourde: Melina Modena in Treatment: 9 Wound Status Wound Number: 6 Primary Diabetic  Wound/Ulcer of the Lower Etiology: Extremity Wound Location: Right Lower Leg - Lateral, Posterior Wound Open Status: Wounding Event: Gradually Appeared Comorbid Sleep Apnea, Arrhythmia, Hypertension, Date Acquired: 11/04/2016 History: Cirrhosis , Type II Diabetes, End Stage Weeks Of Treatment: 9 Renal Disease, Gout, Osteoarthritis Clustered Wound: No Photos Photo Uploaded By: Alric Quan on 03/08/2017 17:14:31 Wound Measurements Length: (cm) 3.8 Width: (cm) 1.2 Depth: (cm) 2 Area: (cm) 3.581 Volume: (cm) 7.163 % Reduction in Area: 64.9% % Reduction in Volume: 53.2% Epithelialization: None Tunneling: No Undermining: No Wound Description Classification: Grade 2 Foul Odor Aft Wound Margin: Distinct, outline attached Slough/Fibrin Exudate Amount: Large Exudate Type: Serosanguineous Exudate Color: red, brown er Cleansing: No o No Wound Bed Granulation Amount: Large (67-100%) Granulation Quality: Red, Pink [name], Connie R. ([id number]) Necrotic Amount: Small (1-33%) Necrotic Quality: Adherent Slough Periwound Skin Texture Texture Color No Abnormalities Noted: No No Abnormalities Noted: No Erythema: Yes Moisture Erythema Location: Circumferential No Abnormalities Noted: No Maceration: Yes Temperature / Pain Temperature: No Abnormality Tenderness on Palpation: Yes Wound Preparation Ulcer Cleansing: Rinsed/Irrigated with Saline, Other: soap and water, Topical Anesthetic Applied: Other: LIDOCAINE 4%, Electronic Signature(s) Signed: 03/08/2017 5:34:36 PM By: Alric Quan Entered By: Alric Quan on 03/08/2017 12:56:55 [name] ([id number]) -------------------------------------------------------------------------------- Wound Assessment Details Patient Name: [name] Date of Service: 03/08/2017 12:30 PM Medical Record Patient Account Number: [id number] Number: Treating RN: Ahmed Prima 1934/11/14 (83 y.o. Other Clinician: Date  of Birth/Sex: Female) Treating ROBSON, MICHAEL Primary Care Cecely Rengel: Emily Filbert Kamica Florance/Extender: G Referring Levetta Bognar: Melina Modena in Treatment: 9 Wound Status Wound Number: 7 Primary Diabetic Wound/Ulcer of the Lower Etiology: Extremity Wound Location: Right Malleolus - Lateral Wound Open Wounding Event: Gradually Appeared Status: Date Acquired: 01/11/2017 Comorbid Sleep Apnea, Arrhythmia, Hypertension, Weeks Of Treatment: 8 History: Cirrhosis , Type II Diabetes, End Stage Clustered Wound: No Renal Disease, Gout, Osteoarthritis Photos Photo Uploaded By: Alric Quan  on 03/08/2017 17:15:16 Wound Measurements Length: (cm) 0 % Reductio Width: (cm) 0 % Reductio Depth: (cm) 0 Epithelial Area: (cm) 0 Tunneling Volume: (cm) 0 Undermini n in Area: 100% n in Volume: 100% ization: Large (67-100%) : No ng: No Wound Description Classification: Grade 2 Wound Margin: Distinct, outline attached Exudate Amount: None Present Foul Odor After Cleansing: No Slough/Fibrino No Wound Bed Granulation Amount: None Present (0%) Necrotic Amount: None Present (0%) Periwound Skin Texture [name], Connie R. ([id number]) Texture Color No Abnormalities Noted: No No Abnormalities Noted: No Callus: No Atrophie Blanche: No Crepitus: No Cyanosis: No Excoriation: No Ecchymosis: No Induration: No Erythema: No Rash: No Hemosiderin Staining: No Scarring: No Mottled: No Pallor: No Moisture Rubor: No No Abnormalities Noted: No Dry / Scaly: No Temperature / Pain Maceration: No Temperature: No Abnormality Wound Preparation Ulcer Cleansing: Rinsed/Irrigated with Saline Topical Anesthetic Applied: None Electronic Signature(s) Signed: 03/08/2017 5:34:36 PM By: Alric Quan Entered By: Alric Quan on 03/08/2017 12:57:23 [name] ([id number]) -------------------------------------------------------------------------------- Wound Assessment Details Patient Name:  [name] Date of Service: 03/08/2017 12:30 PM Medical Record Patient Account Number: [id number] Number: Treating RN: Ahmed Prima [birth year] (83 y.o. Other Clinician: Date of Birth/Sex: Female) Treating ROBSON, Ailey Primary Care Fiana Gladu: Emily Filbert Demri Poulton/Extender: G Referring Zadin Lange: Melina Modena in Treatment: 9 Wound Status Wound Number: 9 Primary Diabetic Wound/Ulcer of the Lower Etiology: Extremity Wound Location: Left Metatarsal head first - Lateral Wound Open Status: Wounding Event: Gradually Appeared Comorbid Sleep Apnea, Arrhythmia, Hypertension, Date Acquired: 02/01/2017 History: Cirrhosis , Type II Diabetes, End Stage Weeks Of Treatment: 5 Renal Disease, Gout, Osteoarthritis Clustered Wound: No Photos Photo Uploaded By: Alric Quan on 03/08/2017 17:15:16 Wound Measurements Length: (cm) 0.5 Width: (cm) 0.1 Depth: (cm) 0.1 Area: (cm) 0.039 Volume: (cm) 0.004 % Reduction in Area: 58.5% % Reduction in Volume: 55.6% Epithelialization: None Tunneling: No Undermining: No Wound Description Classification: Grade 2 Foul Odor Afte Wound Margin: Distinct, outline attached Slough/Fibrino Exudate Amount: None Present r Cleansing: No No Wound Bed Granulation Amount: None Present (0%) Necrotic Amount: Large (67-100%) Necrotic Quality: [name], Connie R. ([id number]) Periwound Skin Texture Texture Color No Abnormalities Noted: No No Abnormalities Noted: No Moisture Temperature / Pain No Abnormalities Noted: No Tenderness on Palpation: Yes Maceration: Yes Wound Preparation Ulcer Cleansing: Rinsed/Irrigated with Saline, Other: soap and water, Topical Anesthetic Applied: Other: lidocaine 4%, Electronic Signature(s) Signed: 03/08/2017 5:34:36 PM By: Alric Quan Entered By: Alric Quan on 03/08/2017 12:58:02 [name]  ([id number]) -------------------------------------------------------------------------------- [place] Details Patient Name: [name] Date of Service: 03/08/2017 12:30 PM Medical Record Patient Account Number: [id number] Number: Treating RN: Ahmed Prima March 03, 1934 (83 y.o. Other Clinician: Date of Birth/Sex: Female) Treating ROBSON, MICHAEL Primary Care Charnelle Bergeman: Emily Filbert Shi Grose/Extender: G Referring Jerren Flinchbaugh: Melina Modena in Treatment: 9 Vital Signs Time Taken: 12:39 Temperature (F): 98.0 Height (in): 61 Pulse (bpm): 101 Weight (lbs): 206 Respiratory Rate (breaths/min): 18 Body Mass Index (BMI): 38.9 Blood Pressure (mmHg): 98/53 Reference Range: 80 - 120 mg / dl Electronic Signature(s) Signed: 03/08/2017 5:34:36 PM By: Alric Quan Entered By: Alric Quan on 03/08/2017 12:40:51

## 2017-03-10 ENCOUNTER — Inpatient Hospital Stay: Payer: Medicare Other | Admitting: Anesthesiology

## 2017-03-10 ENCOUNTER — Encounter: Payer: Self-pay | Admitting: Anesthesiology

## 2017-03-10 ENCOUNTER — Encounter
Admission: RE | Disposition: A | Discharge status: Hospice | Payer: Self-pay | Source: Ambulatory Visit | Attending: Vascular Surgery

## 2017-03-10 ENCOUNTER — Inpatient Hospital Stay
Admission: RE | Admit: 2017-03-10 | Discharge: 2017-03-20 | DRG: 239 | Disposition: A | Discharge status: Hospice | Payer: Medicare Other | Source: Ambulatory Visit | Attending: Internal Medicine | Admitting: Internal Medicine

## 2017-03-10 DIAGNOSIS — I959 Hypotension, unspecified: Secondary | ICD-10-CM | POA: Diagnosis not present

## 2017-03-10 DIAGNOSIS — E1151 Type 2 diabetes mellitus with diabetic peripheral angiopathy without gangrene: Secondary | ICD-10-CM | POA: Diagnosis present

## 2017-03-10 DIAGNOSIS — I9581 Postprocedural hypotension: Secondary | ICD-10-CM | POA: Diagnosis not present

## 2017-03-10 DIAGNOSIS — G9341 Metabolic encephalopathy: Secondary | ICD-10-CM | POA: Diagnosis present

## 2017-03-10 DIAGNOSIS — Z7189 Other specified counseling: Secondary | ICD-10-CM

## 2017-03-10 DIAGNOSIS — T8119XA Other postprocedural shock, initial encounter: Secondary | ICD-10-CM | POA: Diagnosis not present

## 2017-03-10 DIAGNOSIS — G4733 Obstructive sleep apnea (adult) (pediatric): Secondary | ICD-10-CM | POA: Diagnosis present

## 2017-03-10 DIAGNOSIS — Z515 Encounter for palliative care: Secondary | ICD-10-CM | POA: Diagnosis not present

## 2017-03-10 DIAGNOSIS — R4182 Altered mental status, unspecified: Secondary | ICD-10-CM

## 2017-03-10 DIAGNOSIS — E1122 Type 2 diabetes mellitus with diabetic chronic kidney disease: Secondary | ICD-10-CM | POA: Diagnosis present

## 2017-03-10 DIAGNOSIS — K219 Gastro-esophageal reflux disease without esophagitis: Secondary | ICD-10-CM | POA: Diagnosis present

## 2017-03-10 DIAGNOSIS — L97529 Non-pressure chronic ulcer of other part of left foot with unspecified severity: Secondary | ICD-10-CM | POA: Diagnosis present

## 2017-03-10 DIAGNOSIS — R6521 Severe sepsis with septic shock: Secondary | ICD-10-CM | POA: Diagnosis not present

## 2017-03-10 DIAGNOSIS — I9589 Other hypotension: Secondary | ICD-10-CM | POA: Diagnosis present

## 2017-03-10 DIAGNOSIS — Z66 Do not resuscitate: Secondary | ICD-10-CM | POA: Diagnosis present

## 2017-03-10 DIAGNOSIS — A419 Sepsis, unspecified organism: Secondary | ICD-10-CM

## 2017-03-10 DIAGNOSIS — Z992 Dependence on renal dialysis: Secondary | ICD-10-CM

## 2017-03-10 DIAGNOSIS — D631 Anemia in chronic kidney disease: Secondary | ICD-10-CM | POA: Diagnosis present

## 2017-03-10 DIAGNOSIS — R443 Hallucinations, unspecified: Secondary | ICD-10-CM | POA: Diagnosis not present

## 2017-03-10 DIAGNOSIS — I132 Hypertensive heart and chronic kidney disease with heart failure and with stage 5 chronic kidney disease, or end stage renal disease: Secondary | ICD-10-CM | POA: Diagnosis present

## 2017-03-10 DIAGNOSIS — Z89512 Acquired absence of left leg below knee: Secondary | ICD-10-CM

## 2017-03-10 DIAGNOSIS — Z86718 Personal history of other venous thrombosis and embolism: Secondary | ICD-10-CM

## 2017-03-10 DIAGNOSIS — I4891 Unspecified atrial fibrillation: Secondary | ICD-10-CM | POA: Diagnosis present

## 2017-03-10 DIAGNOSIS — E1169 Type 2 diabetes mellitus with other specified complication: Secondary | ICD-10-CM | POA: Diagnosis present

## 2017-03-10 DIAGNOSIS — N2581 Secondary hyperparathyroidism of renal origin: Secondary | ICD-10-CM | POA: Diagnosis present

## 2017-03-10 DIAGNOSIS — Z79899 Other long term (current) drug therapy: Secondary | ICD-10-CM

## 2017-03-10 DIAGNOSIS — J45909 Unspecified asthma, uncomplicated: Secondary | ICD-10-CM | POA: Diagnosis present

## 2017-03-10 DIAGNOSIS — I481 Persistent atrial fibrillation: Secondary | ICD-10-CM | POA: Diagnosis present

## 2017-03-10 DIAGNOSIS — I4819 Other persistent atrial fibrillation: Secondary | ICD-10-CM

## 2017-03-10 DIAGNOSIS — N186 End stage renal disease: Secondary | ICD-10-CM | POA: Diagnosis not present

## 2017-03-10 DIAGNOSIS — E11621 Type 2 diabetes mellitus with foot ulcer: Secondary | ICD-10-CM | POA: Diagnosis present

## 2017-03-10 DIAGNOSIS — R21 Rash and other nonspecific skin eruption: Secondary | ICD-10-CM | POA: Diagnosis not present

## 2017-03-10 DIAGNOSIS — M86172 Other acute osteomyelitis, left ankle and foot: Secondary | ICD-10-CM | POA: Diagnosis present

## 2017-03-10 DIAGNOSIS — T8111XD Postprocedural  cardiogenic shock, subsequent encounter: Secondary | ICD-10-CM | POA: Diagnosis not present

## 2017-03-10 DIAGNOSIS — Y835 Amputation of limb(s) as the cause of abnormal reaction of the patient, or of later complication, without mention of misadventure at the time of the procedure: Secondary | ICD-10-CM | POA: Diagnosis not present

## 2017-03-10 DIAGNOSIS — I70248 Atherosclerosis of native arteries of left leg with ulceration of other part of lower left leg: Secondary | ICD-10-CM

## 2017-03-10 DIAGNOSIS — R0989 Other specified symptoms and signs involving the circulatory and respiratory systems: Secondary | ICD-10-CM

## 2017-03-10 DIAGNOSIS — R748 Abnormal levels of other serum enzymes: Secondary | ICD-10-CM | POA: Diagnosis not present

## 2017-03-10 DIAGNOSIS — J90 Pleural effusion, not elsewhere classified: Secondary | ICD-10-CM | POA: Diagnosis not present

## 2017-03-10 DIAGNOSIS — R05 Cough: Secondary | ICD-10-CM

## 2017-03-10 DIAGNOSIS — Z85828 Personal history of other malignant neoplasm of skin: Secondary | ICD-10-CM

## 2017-03-10 DIAGNOSIS — Z993 Dependence on wheelchair: Secondary | ICD-10-CM

## 2017-03-10 DIAGNOSIS — Z794 Long term (current) use of insulin: Secondary | ICD-10-CM

## 2017-03-10 DIAGNOSIS — R0602 Shortness of breath: Secondary | ICD-10-CM

## 2017-03-10 DIAGNOSIS — R059 Cough, unspecified: Secondary | ICD-10-CM

## 2017-03-10 DIAGNOSIS — I509 Heart failure, unspecified: Secondary | ICD-10-CM | POA: Diagnosis present

## 2017-03-10 DIAGNOSIS — R0600 Dyspnea, unspecified: Secondary | ICD-10-CM

## 2017-03-10 HISTORY — PX: AMPUTATION: SHX166

## 2017-03-10 LAB — CREATININE, SERUM
CREATININE: 2.56 mg/dL — AB (ref 0.44–1.00)
GFR calc Af Amer: 19 mL/min — ABNORMAL LOW (ref >60)
GFR calc non Af Amer: 16 mL/min — ABNORMAL LOW (ref >60)

## 2017-03-10 LAB — POCT I-STAT 4, (NA,K, GLUC, HGB,HCT)
GLUCOSE: 133 mg/dL — AB (ref 65–99)
HCT: 37 % (ref 36.0–46.0)
Hemoglobin: 12.6 g/dL (ref 12.0–15.0)
Potassium: 3.2 mmol/L — ABNORMAL LOW (ref 3.5–5.1)
Sodium: 132 mmol/L — ABNORMAL LOW (ref 135–145)

## 2017-03-10 LAB — CBC
HEMATOCRIT: 32.9 % — AB (ref 35.0–47.0)
Hemoglobin: 10.9 g/dL — ABNORMAL LOW (ref 12.0–16.0)
MCH: 33.1 pg (ref 26.0–34.0)
MCHC: 33.2 g/dL (ref 32.0–36.0)
MCV: 99.5 fL (ref 80.0–100.0)
Platelets: 184 10*3/uL (ref 150–440)
RBC: 3.31 MIL/uL — ABNORMAL LOW (ref 3.80–5.20)
RDW: 16.8 % — AB (ref 11.5–14.5)
WBC: 14.3 10*3/uL — ABNORMAL HIGH (ref 3.6–11.0)

## 2017-03-10 LAB — GLUCOSE, CAPILLARY
GLUCOSE-CAPILLARY: 127 mg/dL — AB (ref 65–99)
Glucose-Capillary: 116 mg/dL — ABNORMAL HIGH (ref 65–99)
Glucose-Capillary: 156 mg/dL — ABNORMAL HIGH (ref 65–99)
Glucose-Capillary: 149 mg/dL — ABNORMAL HIGH (ref 65–99)

## 2017-03-10 LAB — ABO/RH: ABO/RH(D): O NEG

## 2017-03-10 SURGERY — AMPUTATION BELOW KNEE
Anesthesia: General | Laterality: Left | Wound class: Clean

## 2017-03-10 MED ORDER — DOCUSATE SODIUM 100 MG PO CAPS: 100.0000 mg | ORAL_CAPSULE | Freq: Every day | ORAL | Status: DC

## 2017-03-10 MED ORDER — METHYLPREDNISOLONE SODIUM SUCC 125 MG IJ SOLR
INTRAMUSCULAR | Status: DC | PRN
  Administered 2017-03-10: 125 mg via INTRAVENOUS

## 2017-03-10 MED ORDER — ACETAMINOPHEN 325 MG PO TABS
325.0000 mg | ORAL_TABLET | ORAL | Status: DC | PRN
  Administered 2017-03-12: 650 mg via ORAL
  Administered 2017-03-19: 325 mg via ORAL
  Filled 2017-03-10: qty 2
  Filled 2017-03-10: qty 1

## 2017-03-10 MED ORDER — HYDROMORPHONE HCL 1 MG/ML IJ SOLN
INTRAMUSCULAR | Status: AC
  Administered 2017-03-10: 0.25 mg via INTRAVENOUS
  Filled 2017-03-10: qty 1

## 2017-03-10 MED ORDER — METOPROLOL TARTRATE 5 MG/5ML IV SOLN: 2.0000 mg | INTRAVENOUS | Status: DC | PRN

## 2017-03-10 MED ORDER — EPHEDRINE SULFATE 50 MG/ML IJ SOLN
INTRAMUSCULAR | Status: AC
  Administered 2017-03-10: 20 mg via INTRAVENOUS
  Filled 2017-03-10: qty 1

## 2017-03-10 MED ORDER — TEMAZEPAM 7.5 MG PO CAPS: 15.0000 mg | ORAL_CAPSULE | Freq: Every evening | ORAL | Status: DC | PRN

## 2017-03-10 MED ORDER — POTASSIUM CHLORIDE CRYS ER 20 MEQ PO TBCR: 20.0000 meq | EXTENDED_RELEASE_TABLET | Freq: Every day | ORAL | Status: DC | PRN

## 2017-03-10 MED ORDER — PROMETHAZINE HCL 25 MG PO TABS
25.0000 mg | ORAL_TABLET | Freq: Four times a day (QID) | ORAL | Status: DC | PRN
  Administered 2017-03-11: 25 mg via ORAL
  Filled 2017-03-10: qty 1

## 2017-03-10 MED ORDER — METHYLPREDNISOLONE SODIUM SUCC 125 MG IJ SOLR
INTRAMUSCULAR | Status: AC
  Filled 2017-03-10: qty 2

## 2017-03-10 MED ORDER — TRAMADOL HCL 50 MG PO TABS: 50.0000 mg | ORAL_TABLET | Freq: Four times a day (QID) | ORAL | Status: DC | PRN

## 2017-03-10 MED ORDER — MIDODRINE HCL 5 MG PO TABS
10.0000 mg | ORAL_TABLET | ORAL | Status: DC
  Administered 2017-03-10 (×2): 10 mg via ORAL
  Filled 2017-03-10 (×2): qty 2

## 2017-03-10 MED ORDER — ACETAMINOPHEN 10 MG/ML IV SOLN
INTRAVENOUS | Status: AC
  Administered 2017-03-10: 1000 mg via INTRAVENOUS
  Filled 2017-03-10: qty 100

## 2017-03-10 MED ORDER — DOCUSATE SODIUM 100 MG PO CAPS
100.0000 mg | ORAL_CAPSULE | Freq: Two times a day (BID) | ORAL | Status: DC
Start: 2017-03-10 — End: 2017-03-20
  Administered 2017-03-10 – 2017-03-19 (×15): 100 mg via ORAL
  Filled 2017-03-10 (×15): qty 1

## 2017-03-10 MED ORDER — FENTANYL CITRATE (PF) 100 MCG/2ML IJ SOLN
INTRAMUSCULAR | Status: DC | PRN
  Administered 2017-03-10: 50 ug via INTRAVENOUS
  Administered 2017-03-10 (×2): 25 ug via INTRAVENOUS

## 2017-03-10 MED ORDER — HEPARIN SODIUM (PORCINE) 5000 UNIT/ML IJ SOLN: 5000.0000 [IU] | Freq: Three times a day (TID) | INTRAMUSCULAR | Status: DC

## 2017-03-10 MED ORDER — ACETAMINOPHEN 650 MG RE SUPP: 325.0000 mg | RECTAL | Status: DC | PRN

## 2017-03-10 MED ORDER — LIDOCAINE HCL (PF) 2 % IJ SOLN
INTRAMUSCULAR | Status: AC
  Filled 2017-03-10: qty 2

## 2017-03-10 MED ORDER — SODIUM CHLORIDE 0.9 % IV SOLN
20.0000 ug/min | INTRAVENOUS | Status: DC
  Administered 2017-03-10: 20 ug/min via INTRAVENOUS
  Filled 2017-03-10: qty 2

## 2017-03-10 MED ORDER — HYDROMORPHONE HCL 1 MG/ML IJ SOLN
0.2500 mg | Freq: Once | INTRAMUSCULAR | Status: AC
  Administered 2017-03-10: 0.25 mg via INTRAVENOUS

## 2017-03-10 MED ORDER — CHLORHEXIDINE GLUCONATE CLOTH 2 % EX PADS
6.0000 | MEDICATED_PAD | Freq: Once | CUTANEOUS | Status: AC
  Administered 2017-03-10: 6 via TOPICAL

## 2017-03-10 MED ORDER — CLINDAMYCIN PHOSPHATE 300 MG/50ML IV SOLN
INTRAVENOUS | Status: AC
  Administered 2017-03-10: 300 mg via INTRAVENOUS
  Filled 2017-03-10: qty 50

## 2017-03-10 MED ORDER — SODIUM CHLORIDE 0.9 % IV SOLN
INTRAVENOUS | Status: DC | PRN
  Administered 2017-03-10: 100 ug/min via INTRAVENOUS

## 2017-03-10 MED ORDER — KETOROLAC TROMETHAMINE 15 MG/ML IJ SOLN
15.0000 mg | Freq: Three times a day (TID) | INTRAMUSCULAR | Status: AC
Start: 2017-03-10 — End: 2017-03-12
  Administered 2017-03-10 – 2017-03-12 (×6): 15 mg via INTRAVENOUS
  Filled 2017-03-10 (×6): qty 1

## 2017-03-10 MED ORDER — FAMOTIDINE IN NACL 20-0.9 MG/50ML-% IV SOLN
20.0000 mg | Freq: Two times a day (BID) | INTRAVENOUS | Status: DC
  Administered 2017-03-10 – 2017-03-11 (×2): 20 mg via INTRAVENOUS
  Filled 2017-03-10 (×2): qty 50

## 2017-03-10 MED ORDER — SODIUM CHLORIDE 0.9 % IV SOLN
INTRAVENOUS | Status: DC
  Administered 2017-03-10: 11:00:00 via INTRAVENOUS

## 2017-03-10 MED ORDER — VANCOMYCIN HCL IN DEXTROSE 1-5 GM/200ML-% IV SOLN
INTRAVENOUS | Status: AC
  Administered 2017-03-10: 1000 mg via INTRAVENOUS
  Filled 2017-03-10: qty 200

## 2017-03-10 MED ORDER — SODIUM CHLORIDE 0.9 % IV SOLN
0.0000 ug/min | INTRAVENOUS | Status: DC
  Administered 2017-03-10: 95 ug/min via INTRAVENOUS
  Administered 2017-03-10: 90 ug/min via INTRAVENOUS
  Administered 2017-03-11: 170 ug/min via INTRAVENOUS
  Administered 2017-03-11: 100 ug/min via INTRAVENOUS
  Administered 2017-03-11: 180 ug/min via INTRAVENOUS
  Administered 2017-03-11: 150.133 ug/min via INTRAVENOUS
  Administered 2017-03-12 (×2): 250 ug/min via INTRAVENOUS
  Administered 2017-03-12: 200 ug/min via INTRAVENOUS
  Administered 2017-03-12: 180 ug/min via INTRAVENOUS
  Administered 2017-03-12: 220 ug/min via INTRAVENOUS
  Administered 2017-03-12: 250 ug/min via INTRAVENOUS
  Administered 2017-03-12: 180 ug/min via INTRAVENOUS
  Administered 2017-03-12: 220 ug/min via INTRAVENOUS
  Administered 2017-03-13: 250 ug/min via INTRAVENOUS
  Administered 2017-03-13: 175 ug/min via INTRAVENOUS
  Administered 2017-03-13: 150 ug/min via INTRAVENOUS
  Administered 2017-03-13: 250 ug/min via INTRAVENOUS
  Filled 2017-03-10 (×23): qty 4

## 2017-03-10 MED ORDER — PANTOPRAZOLE SODIUM 40 MG PO TBEC
40.0000 mg | DELAYED_RELEASE_TABLET | Freq: Every day | ORAL | Status: DC
  Administered 2017-03-11 – 2017-03-14 (×4): 40 mg via ORAL
  Filled 2017-03-10 (×5): qty 1

## 2017-03-10 MED ORDER — ALUM & MAG HYDROXIDE-SIMETH 200-200-20 MG/5ML PO SUSP: 15.0000 mL | ORAL | Status: DC | PRN

## 2017-03-10 MED ORDER — SUCCINYLCHOLINE CHLORIDE 20 MG/ML IJ SOLN
INTRAMUSCULAR | Status: AC
  Filled 2017-03-10: qty 1

## 2017-03-10 MED ORDER — FENTANYL CITRATE (PF) 100 MCG/2ML IJ SOLN
INTRAMUSCULAR | Status: AC
  Administered 2017-03-10: 25 ug via INTRAVENOUS
  Filled 2017-03-10: qty 2

## 2017-03-10 MED ORDER — FLUTICASONE PROPIONATE 50 MCG/ACT NA SUSP
2.0000 | Freq: Every day | NASAL | Status: DC
  Administered 2017-03-10 – 2017-03-12 (×2): 2 via NASAL
  Filled 2017-03-10: qty 16

## 2017-03-10 MED ORDER — PROPOFOL 10 MG/ML IV BOLUS
INTRAVENOUS | Status: DC | PRN
  Administered 2017-03-10: 80 mg via INTRAVENOUS

## 2017-03-10 MED ORDER — EPINEPHRINE PF 1 MG/ML IJ SOLN
INTRAMUSCULAR | Status: AC
Start: 2017-03-10 — End: 2017-03-10
  Filled 2017-03-10: qty 1

## 2017-03-10 MED ORDER — CLINDAMYCIN PHOSPHATE 300 MG/50ML IV SOLN
300.0000 mg | Freq: Once | INTRAVENOUS | Status: AC
  Administered 2017-03-10: 300 mg via INTRAVENOUS

## 2017-03-10 MED ORDER — CALCIUM ACETATE (PHOS BINDER) 667 MG PO CAPS
667.0000 mg | ORAL_CAPSULE | Freq: Two times a day (BID) | ORAL | Status: DC
  Administered 2017-03-11 – 2017-03-12 (×3): 667 mg via ORAL
  Filled 2017-03-10 (×3): qty 1

## 2017-03-10 MED ORDER — MEGESTROL ACETATE 40 MG PO TABS
40.0000 mg | ORAL_TABLET | Freq: Every day | ORAL | Status: DC
  Administered 2017-03-11 – 2017-03-19 (×7): 40 mg via ORAL
  Filled 2017-03-10 (×8): qty 1

## 2017-03-10 MED ORDER — PROPRANOLOL HCL 10 MG PO TABS
40.0000 mg | ORAL_TABLET | Freq: Every day | ORAL | Status: DC
  Administered 2017-03-11: 40 mg via ORAL
  Filled 2017-03-10: qty 4

## 2017-03-10 MED ORDER — PROPOFOL 10 MG/ML IV BOLUS
INTRAVENOUS | Status: AC
  Filled 2017-03-10: qty 20

## 2017-03-10 MED ORDER — VITAMIN D 1000 UNITS PO TABS
1000.0000 [IU] | ORAL_TABLET | Freq: Every day | ORAL | Status: DC
  Administered 2017-03-11 – 2017-03-12 (×2): 1000 [IU] via ORAL
  Filled 2017-03-10 (×2): qty 1

## 2017-03-10 MED ORDER — MAGNESIUM SULFATE 2 GM/50ML IV SOLN: 2.0000 g | Freq: Every day | INTRAVENOUS | Status: DC | PRN

## 2017-03-10 MED ORDER — ONDANSETRON HCL 4 MG/2ML IJ SOLN
4.0000 mg | Freq: Four times a day (QID) | INTRAMUSCULAR | Status: DC | PRN
  Administered 2017-03-11 – 2017-03-15 (×2): 4 mg via INTRAVENOUS
  Filled 2017-03-10 (×2): qty 2

## 2017-03-10 MED ORDER — PHENYLEPHRINE HCL 10 MG/ML IJ SOLN
INTRAMUSCULAR | Status: DC | PRN
  Administered 2017-03-10 (×2): 150 ug via INTRAVENOUS
  Administered 2017-03-10: 100 ug via INTRAVENOUS
  Administered 2017-03-10: 200 ug via INTRAVENOUS
  Administered 2017-03-10: 100 ug via INTRAVENOUS
  Administered 2017-03-10: 150 ug via INTRAVENOUS

## 2017-03-10 MED ORDER — SEVELAMER CARBONATE 800 MG PO TABS
1600.0000 mg | ORAL_TABLET | Freq: Three times a day (TID) | ORAL | Status: DC
  Administered 2017-03-11: 1600 mg via ORAL
  Filled 2017-03-10 (×2): qty 2

## 2017-03-10 MED ORDER — HYDROCODONE-ACETAMINOPHEN 5-325 MG PO TABS
1.0000 | ORAL_TABLET | Freq: Four times a day (QID) | ORAL | Status: DC | PRN
  Administered 2017-03-10 – 2017-03-14 (×2): 1 via ORAL
  Filled 2017-03-10 (×2): qty 1

## 2017-03-10 MED ORDER — IPRATROPIUM-ALBUTEROL 0.5-2.5 (3) MG/3ML IN SOLN: 3.0000 mL | Freq: Four times a day (QID) | RESPIRATORY_TRACT | Status: DC | PRN

## 2017-03-10 MED ORDER — FENTANYL CITRATE (PF) 100 MCG/2ML IJ SOLN
25.0000 ug | INTRAMUSCULAR | Status: DC | PRN
  Administered 2017-03-10: 25 ug via INTRAVENOUS

## 2017-03-10 MED ORDER — SUCCINYLCHOLINE CHLORIDE 20 MG/ML IJ SOLN
INTRAMUSCULAR | Status: DC | PRN
  Administered 2017-03-10: 100 mg via INTRAVENOUS

## 2017-03-10 MED ORDER — MORPHINE SULFATE (PF) 4 MG/ML IV SOLN
2.0000 mg | INTRAVENOUS | Status: DC | PRN
  Administered 2017-03-10 (×2): 2 mg via INTRAVENOUS
  Filled 2017-03-10: qty 1

## 2017-03-10 MED ORDER — INSULIN GLARGINE 100 UNIT/ML ~~LOC~~ SOLN
10.0000 [IU] | Freq: Every day | SUBCUTANEOUS | Status: DC
  Administered 2017-03-10 – 2017-03-14 (×4): 10 [IU] via SUBCUTANEOUS
  Filled 2017-03-10 (×7): qty 0.1

## 2017-03-10 MED ORDER — RENA-VITE PO TABS
1.0000 | ORAL_TABLET | Freq: Every day | ORAL | Status: DC
  Administered 2017-03-11 – 2017-03-19 (×7): 1 via ORAL
  Filled 2017-03-10 (×7): qty 1

## 2017-03-10 MED ORDER — HYDRALAZINE HCL 20 MG/ML IJ SOLN: 5.0000 mg | INTRAMUSCULAR | Status: DC | PRN

## 2017-03-10 MED ORDER — SORBITOL 70 % SOLN
30.0000 mL | Freq: Every day | Status: DC | PRN
  Filled 2017-03-10 (×2): qty 30

## 2017-03-10 MED ORDER — SODIUM CHLORIDE 0.9 % IV SOLN
INTRAVENOUS | Status: DC
  Administered 2017-03-10 (×2): via INTRAVENOUS

## 2017-03-10 MED ORDER — ALLOPURINOL 100 MG PO TABS
100.0000 mg | ORAL_TABLET | Freq: Every day | ORAL | Status: DC
  Administered 2017-03-11: 100 mg via ORAL
  Filled 2017-03-10: qty 1

## 2017-03-10 MED ORDER — SEVELAMER CARBONATE 800 MG PO TABS
800.0000 mg | ORAL_TABLET | Freq: Two times a day (BID) | ORAL | Status: DC
  Administered 2017-03-11 – 2017-03-12 (×3): 800 mg via ORAL
  Filled 2017-03-10 (×3): qty 1

## 2017-03-10 MED ORDER — MORPHINE SULFATE (PF) 4 MG/ML IV SOLN: 2.0000 mg | INTRAVENOUS | Status: DC | PRN

## 2017-03-10 MED ORDER — LABETALOL HCL 5 MG/ML IV SOLN: 10.0000 mg | INTRAVENOUS | Status: DC | PRN

## 2017-03-10 MED ORDER — ACETAMINOPHEN 10 MG/ML IV SOLN
1000.0000 mg | Freq: Four times a day (QID) | INTRAVENOUS | Status: DC
  Administered 2017-03-10: 1000 mg via INTRAVENOUS

## 2017-03-10 MED ORDER — SENNA 8.6 MG PO TABS: 2.0000 | ORAL_TABLET | Freq: Every day | ORAL | Status: DC | PRN

## 2017-03-10 MED ORDER — DILTIAZEM HCL 30 MG PO TABS
30.0000 mg | ORAL_TABLET | Freq: Three times a day (TID) | ORAL | Status: DC
  Administered 2017-03-10 – 2017-03-11 (×2): 30 mg via ORAL
  Filled 2017-03-10 (×3): qty 1

## 2017-03-10 MED ORDER — ONDANSETRON HCL 4 MG/2ML IJ SOLN
4.0000 mg | Freq: Once | INTRAMUSCULAR | Status: AC | PRN
  Administered 2017-03-11: 4 mg via INTRAVENOUS
  Filled 2017-03-10: qty 2

## 2017-03-10 MED ORDER — HEPARIN SODIUM (PORCINE) 5000 UNIT/ML IJ SOLN
5000.0000 [IU] | Freq: Three times a day (TID) | INTRAMUSCULAR | Status: DC
  Administered 2017-03-11 – 2017-03-19 (×24): 5000 [IU] via SUBCUTANEOUS
  Filled 2017-03-10 (×24): qty 1

## 2017-03-10 MED ORDER — OXYCODONE-ACETAMINOPHEN 5-325 MG PO TABS
1.0000 | ORAL_TABLET | ORAL | Status: DC | PRN
  Administered 2017-03-10: 2 via ORAL
  Filled 2017-03-10: qty 2

## 2017-03-10 MED ORDER — PHENOL 1.4 % MT LIQD
1.0000 | OROMUCOSAL | Status: DC | PRN
  Filled 2017-03-10: qty 177

## 2017-03-10 MED ORDER — CLINDAMYCIN PHOSPHATE 300 MG/50ML IV SOLN
300.0000 mg | Freq: Three times a day (TID) | INTRAVENOUS | Status: AC
  Administered 2017-03-11 (×2): 300 mg via INTRAVENOUS
  Filled 2017-03-10 (×4): qty 50

## 2017-03-10 MED ORDER — VANCOMYCIN HCL IN DEXTROSE 1-5 GM/200ML-% IV SOLN
1000.0000 mg | Freq: Once | INTRAVENOUS | Status: AC
  Administered 2017-03-10: 1000 mg via INTRAVENOUS

## 2017-03-10 MED ORDER — EPHEDRINE SULFATE 50 MG/ML IJ SOLN
20.0000 mg | Freq: Once | INTRAMUSCULAR | Status: AC
  Administered 2017-03-10: 20 mg via INTRAVENOUS

## 2017-03-10 MED ORDER — LIDOCAINE HCL (CARDIAC) 20 MG/ML IV SOLN
INTRAVENOUS | Status: DC | PRN
  Administered 2017-03-10: 80 mg via INTRAVENOUS

## 2017-03-10 MED ORDER — SODIUM CHLORIDE 0.9 % IJ SOLN
INTRAMUSCULAR | Status: AC
  Filled 2017-03-10: qty 10

## 2017-03-10 MED ORDER — POLYETHYLENE GLYCOL 3350 17 G PO PACK: 17.0000 g | PACK | Freq: Every day | ORAL | Status: DC | PRN

## 2017-03-10 MED ORDER — GUAIFENESIN-DM 100-10 MG/5ML PO SYRP: 15.0000 mL | ORAL_SOLUTION | ORAL | Status: DC | PRN

## 2017-03-10 MED ORDER — EPINEPHRINE PF 1 MG/10ML IJ SOSY
PREFILLED_SYRINGE | INTRAMUSCULAR | Status: DC | PRN
  Administered 2017-03-10 (×3): 10 ug via INTRAVENOUS

## 2017-03-10 MED ORDER — FENTANYL CITRATE (PF) 100 MCG/2ML IJ SOLN
INTRAMUSCULAR | Status: AC
  Filled 2017-03-10: qty 2

## 2017-03-10 SURGICAL SUPPLY — 36 items
BANDAGE ELASTIC 6 LF NS (GAUZE/BANDAGES/DRESSINGS) ×3 IMPLANT
BLADE SAGITTAL WIDE XTHICK NO (BLADE) ×3 IMPLANT
BNDG COHESIVE 4X5 TAN STRL (GAUZE/BANDAGES/DRESSINGS) IMPLANT
BNDG GAUZE 4.5X4.1 6PLY STRL (MISCELLANEOUS) ×6 IMPLANT
BRUSH SCRUB 4% CHG (MISCELLANEOUS) ×3 IMPLANT
CANISTER SUCT 1200ML W/VALVE (MISCELLANEOUS) ×3 IMPLANT
DRAIN PENROSE 1/4X12 LTX (DRAIN) IMPLANT
DRAPE INCISE IOBAN 66X45 STRL (DRAPES) ×3 IMPLANT
DURAPREP 26ML APPLICATOR (WOUND CARE) ×3 IMPLANT
ELECT CAUTERY BLADE 6.4 (BLADE) ×3 IMPLANT
ELECT REM PT RETURN 9FT ADLT (ELECTROSURGICAL) ×3
ELECTRODE REM PT RTRN 9FT ADLT (ELECTROSURGICAL) ×1 IMPLANT
GAUZE PETRO XEROFOAM 1X8 (MISCELLANEOUS) ×6 IMPLANT
GLOVE BIO SURGEON STRL SZ7 (GLOVE) ×6 IMPLANT
GLOVE INDICATOR 7.5 STRL GRN (GLOVE) ×3 IMPLANT
GOWN STRL REUS W/ TWL LRG LVL3 (GOWN DISPOSABLE) ×1 IMPLANT
GOWN STRL REUS W/ TWL XL LVL3 (GOWN DISPOSABLE) ×1 IMPLANT
GOWN STRL REUS W/TWL LRG LVL3 (GOWN DISPOSABLE) ×2
GOWN STRL REUS W/TWL XL LVL3 (GOWN DISPOSABLE) ×2
HANDLE YANKAUER SUCT BULB TIP (MISCELLANEOUS) ×3 IMPLANT
KIT RM TURNOVER STRD PROC AR (KITS) ×3 IMPLANT
LABEL OR SOLS (LABEL) ×3 IMPLANT
NS IRRIG 1000ML POUR BTL (IV SOLUTION) ×3 IMPLANT
PACK EXTREMITY ARMC (MISCELLANEOUS) ×3 IMPLANT
PAD ABD DERMACEA PRESS 5X9 (GAUZE/BANDAGES/DRESSINGS) ×6 IMPLANT
PAD PREP 24X41 OB/GYN DISP (PERSONAL CARE ITEMS) ×3 IMPLANT
SPONGE LAP 18X18 5 PK (GAUZE/BANDAGES/DRESSINGS) ×18 IMPLANT
STAPLER SKIN PROX 35W (STAPLE) ×3 IMPLANT
STOCKINETTE M/LG 89821 (MISCELLANEOUS) ×3 IMPLANT
SUT SILK 2 0 (SUTURE) ×2
SUT SILK 2 0 SH (SUTURE) ×6 IMPLANT
SUT SILK 2-0 18XBRD TIE 12 (SUTURE) ×1 IMPLANT
SUT SILK 3 0 (SUTURE) ×2
SUT SILK 3-0 18XBRD TIE 12 (SUTURE) ×1 IMPLANT
SUT VIC AB 0 CT1 36 (SUTURE) ×18 IMPLANT
SUT VIC AB 2-0 CT1 (SUTURE) ×6 IMPLANT

## 2017-03-10 NOTE — Anesthesia Postprocedure Evaluation (Signed)
Anesthesia Post Note  Patient: Rex Kras  Procedure(s) Performed: Procedure(s) (LRB): AMPUTATION BELOW KNEE (Left)  Patient location during evaluation: PACU Anesthesia Type: General Level of consciousness: awake and alert and oriented Respiratory status: spontaneous breathing and patient connected to nasal cannula oxygen Cardiovascular status: tachycardic Anesthetic complications: no Comments: Patient had preop BPs on the low side.  She had a reasonable intraop course with some pressor support keeping the BPs in an acceptable range.  Post op Resp status was stable , but patient required increased narcotics to keep her pain under control which naturally affected her BP.  Decided to send patient to the ICU for pain control and resultant BP support.  Discussed case with family who appear to understand and agree.     Last Vitals:  Vitals:   03/10/17 1653 03/10/17 1657  BP: (!) 98/59   Pulse:    Resp: 19 16  Temp: 36.4 C     Last Pain:  Vitals:   03/10/17 1653  TempSrc: Oral  PainSc:                  Zahi Plaskett

## 2017-03-10 NOTE — Transfer of Care (Signed)
Immediate Anesthesia Transfer of Care Note  Patient: [name]  Procedure(s) Performed: Procedure(s): AMPUTATION BELOW KNEE (Left)  Patient Location: PACU  Anesthesia Type:General  Level of Consciousness: awake, alert  and oriented  Airway & Oxygen Therapy: Patient connected to face mask oxygen  Post-op Assessment: Post -op Vital signs reviewed and stable  Post vital signs: stable  Last Vitals:  Vitals:   03/10/17 1043 03/10/17 1411  BP: (!) 90/48 (!) 100/51  Pulse: (!) 115 (!) 103  Resp: 18 (!) 22  Temp: 36.3 C 36.5 C    Last Pain:  Vitals:   03/10/17 1043  TempSrc: Tympanic         Complications: No apparent anesthesia complications

## 2017-03-10 NOTE — Consult Note (Signed)
[facility] NAME: [name]    MR#:  [id number]  DATE OF BIRTH:  1934-12-08  DATE OF ADMISSION:  03/10/2017  PRIMARY CARE PHYSICIAN: Rusty Aus, MD   CONSULT REQUESTING/REFERRING PHYSICIAN: Dr. Lucky Cowboy  REASON FOR CONSULT: Hypertension, atrial fibrillation, incisional disease  CHIEF COMPLAINT:  No chief complaint on file.  HISTORY OF PRESENT ILLNESS:  [name]  is a 83 y.o. female with a known history of Incisional disease, lower extremity edema, atrial fibrillation, peripheral arterial disease admitted to ICU after having left below-knee amputation. Patient has been found to be in atrial fibrillation with rapid ventricular rate and hypotensive. Started on phenylephrine. Effective blood loss of 300 ML. She received 650 ML fluid in the operating room. Patient has hypertension at baseline with systolic blood pressure fluctuating between 80-100. She is on Midodrin at home.  PAST MEDICAL HISTORY:   Past Medical History:  Diagnosis Date  . A-fib (HCC)    not on anticoagulation  . Anemia   . Asthma   . Bilateral lower extremity edema   . CHF (congestive heart failure) ([place])   . Diabetes mellitus without complication [facility])    Patient takes Insulin  . Dysrhythmia   . ESRD (end stage renal disease) ([place])    Monday, wednesday, Friday DIalysis  . Essential tremor   . GERD (gastroesophageal reflux disease)   . HTN (hypertension)   . OSA (obstructive sleep apnea)   . Osteoarthritis   . Renal insufficiency    Patient is on dialysis and normal days are M,W and F.  . Skin cancer    Resected from legs    PAST SURGICAL HISTOIRY:   Past Surgical History:  Procedure Laterality Date  . ABDOMINAL HYSTERECTOMY    . ANKLE FRACTURE SURGERY     right ankle  . CHOLECYSTECTOMY    . ESOPHAGOGASTRODUODENOSCOPY (EGD) WITH PROPOFOL N/A 10/09/2015   Procedure: ESOPHAGOGASTRODUODENOSCOPY (EGD) WITH PROPOFOL;  Surgeon: Hulen Luster, MD;   [facility] END[facility];  Service: Gastroenterology;  Laterality: N/A;  . FEMUR FRACTURE SURGERY     left femur  . FRACTURE SURGERY    . JOINT REPLACEMENT    . KNEE ARTHROPLASTY    . NEUROPLASTY / TRANSPOSITION MEDIAN NERVE AT CARPAL TUNNEL BILATERAL    . PERIPHERAL VASCULAR CATHETERIZATION Left 08/06/2015   Procedure: A/V Shuntogram/Fistulagram;  Surgeon: Algernon Huxley, MD;  Location: [place] CV LAB;  Service: Cardiovascular;  Laterality: Left;  . PERIPHERAL VASCULAR CATHETERIZATION Left 11/20/2015   Procedure: A/V Shuntogram/Fistulagram;  Surgeon: Algernon Huxley, MD;  Location: [place] [facility] LAB;  Service: Cardiovascular;  Laterality: Left;  . PERIPHERAL VASCULAR CATHETERIZATION N/A 11/20/2015   Procedure: A/V Shunt Intervention;  Surgeon: Algernon Huxley, MD;  Location: [place] CV LAB;  Service: Cardiovascular;  Laterality: N/A;  . PERIPHERAL VASCULAR CATHETERIZATION N/A 09/27/2016   Procedure: IVC Filter Insertion;  Surgeon: Algernon Huxley, MD;  Location: [place] [facility] LAB;  Service: Cardiovascular;  Laterality: N/A;  . PERIPHERAL VASCULAR CATHETERIZATION Left 01/03/2017   Procedure: A/V Fistulagram;  Surgeon: Algernon Huxley, MD;  Location: [place] [facility] LAB;  Service: Cardiovascular;  Laterality: Left;  . PERIPHERAL VASCULAR CATHETERIZATION N/A 01/03/2017   Procedure: A/V Shunt Intervention;  Surgeon: Algernon Huxley, MD;  Location: [place] [facility] LAB;  Service: Cardiovascular;  Laterality: N/A;  . TUBAL LIGATION    . VASCULAR SURGERY      SOCIAL HISTORY:   Social  History  Substance Use Topics  . Smoking status: Never Smoker  . Smokeless tobacco: Never Used  . Alcohol use No    FAMILY HISTORY:   Family History  Problem Relation Age of Onset  . Diabetes type II Father   . Breast cancer Sister 2  . Breast cancer Maternal Grandmother     DRUG ALLERGIES:   Allergies  Allergen Reactions  . Angiotensin Receptor Blockers Other (See Comments)    Reaction:  Hyperkalemia  .  Penicillins Rash and Other (See Comments)    rash    REVIEW OF SYSTEMS:   ROS  CONSTITUTIONAL: Complains of fatigue. EYES: No blurred or double vision.  EARS, NOSE, AND THROAT: No tinnitus or ear pain.  RESPIRATORY: No cough, shortness of breath, wheezing or hemoptysis.  CARDIOVASCULAR: No chest pain, orthopnea, edema.  GASTROINTESTINAL: No nausea, vomiting, diarrhea or abdominal pain.  GENITOURINARY: No dysuria, hematuria.  ENDOCRINE: No polyuria, nocturia,  HEMATOLOGY: No  easy bruising or bleeding SKIN: No rash or lesion. MUSCULOSKELETAL: Left lower extremity pain. NEUROLOGIC: No tingling, numbness, weakness.  PSYCHIATRY: No anxiety or depression.   MEDICATIONS AT HOME:   Prior to Admission medications   Medication Sig Start Date End Date Taking? Authorizing Provider  allopurinol (ZYLOPRIM) 100 MG tablet Take 100 mg by mouth daily.    Yes Historical Provider, MD  calcium acetate (PHOSLO) 667 MG capsule Take 1 capsule (667 mg total) by mouth 2 (two) times daily between meals. 04/10/16  Yes Vaughan Basta, MD  cholecalciferol (VITAMIN D) 1000 units tablet Take 1,000 Units by mouth daily.   Yes Historical Provider, MD  docusate sodium (COLACE) 100 MG capsule Take 1 capsule (100 mg total) by mouth 2 (two) times daily. 04/10/16  Yes Vaughan Basta, MD  fluticasone (FLONASE) 50 MCG/ACT nasal spray Place 2 sprays into both nostrils at bedtime.   Yes Historical Provider, MD  folic acid-vitamin b complex-vitamin c-selenium-zinc (DIALYVITE) 3 MG TABS tablet Take 1 tablet by mouth daily.   Yes Historical Provider, MD  HYDROcodone-acetaminophen (NORCO/VICODIN) 5-325 MG tablet Take 1 tablet by mouth 2 (two) times daily as needed. 11/29/16  Yes Historical Provider, MD  insulin glargine (LANTUS) 100 UNIT/ML injection Inject 0.08 mLs (8 Units total) into the skin at bedtime. Patient taking differently: Inject 10 Units into the skin at bedtime.  09/21/16  Yes Gladstone Lighter, MD   ipratropium-albuterol (DUONEB) 0.5-2.5 (3) MG/3ML SOLN Take 3 mLs by nebulization 4 (four) times daily as needed (for wheezing/shortness of breath).   Yes Historical Provider, MD  lidocaine-prilocaine (EMLA) cream Apply 1 application topically as needed. Apply to access site (left forearm) 1 hour prior to dialysis and cover with clear wrap. Mon, wed, fri 0500   Yes Historical Provider, MD  megestrol (MEGACE) 40 MG tablet Take 40 mg by mouth daily.   Yes Historical Provider, MD  midodrine (PROAMATINE) 10 MG tablet Take 1 tablet (10 mg total) by mouth 3 (three) times daily with meals. Patient taking differently: Take 10 mg by mouth 3 (three) times daily with meals. [place]on Reek, thu, sat - 0900,1800,2100 09/21/16  Yes Gladstone Lighter, MD  pantoprazole (PROTONIX) 40 MG tablet Take 40 mg by mouth daily.   Yes Historical Provider, MD  promethazine (PHENERGAN) 25 MG tablet Take 25 mg by mouth every 6 (six) hours as needed for nausea or vomiting.    Yes Historical Provider, MD  propranolol (INDERAL) 80 MG tablet Take 40 mg by mouth daily. 01/18/17  Yes Historical Provider, MD  RENVELA  800 MG tablet Take 800-1,600 mg by mouth 5 (five) times daily. Pt takes two tablets three times daily with meals and one tablet two times daily with snacks. 06/30/15  Yes Historical Provider, MD  senna (SENOKOT) 8.6 MG TABS tablet Take 2 tablets (17.2 mg total) by mouth daily as needed for mild constipation. 04/10/16  Yes Vaughan Basta, MD  temazepam (RESTORIL) 15 MG capsule Take 15 mg by mouth at bedtime as needed for sleep.   Yes Historical Provider, MD  traMADol (ULTRAM) 50 MG tablet Take 1 tablet (50 mg total) by mouth every 6 (six) hours as needed for moderate pain or severe pain. 09/21/16  Yes Gladstone Lighter, MD      VITAL SIGNS:  Blood pressure 104/65, pulse (!) 136, temperature 97.5 F (36.4 C), temperature source Oral, resp. rate 18, height 5\' 1"  (1.549 m), weight 95.5 kg (210 lb 9.6 oz), SpO2 100  %.  PHYSICAL EXAMINATION:  GENERAL:  83 y.o.-year-old patient lying in the be, obese. EYES: Pupils equal, round, reactive to light and accommodation. No scleral icterus. Extraocular muscles intact.  HEENT: Head atraumatic, normocephalic. Oropharynx and nasopharynx clear.  NECK:  Supple, no jugular venous distention. No thyroid enlargement, no tenderness.  LUNGS: Normal breath sounds bilaterally, no wheezing, rales,rhonchi or crepitation. No use of accessory muscles of respiration.  CARDIOVASCULAR: S1, S2 normal. No murmurs, rubs, or gallops.  ABDOMEN: Soft, nontender, nondistended. Bowel sounds present. No organomegaly or mass.  EXTREMITIES: No pedal edema, cyanosis, or clubbing.  Left below-knee amputation with dressing. NEUROLOGIC: Cranial nerves II through XII are intact. Muscle strength 5/5 in all extremities. Sensation intact. Gait not checked.  PSYCHIATRIC: The patient is alert and oriented x 3.  SKIN: No obvious rash, lesion, or ulcer.   LABORATORY PANEL:   CBC  Recent Labs Lab 03/10/17 1716  WBC 14.3*  HGB 10.9*  HCT 32.9*  PLT 184   ------------------------------------------------------------------------------------------------------------------  Chemistries   Recent Labs Lab 03/10/17 1052 03/10/17 1716  NA 132*  --   K 3.2*  --   GLUCOSE 133*  --   CREATININE  --  2.56*   ------------------------------------------------------------------------------------------------------------------  Cardiac Enzymes No results for input(s): TROPONINI in the last 168 hours. ------------------------------------------------------------------------------------------------------------------  RADIOLOGY:  No results found.  EKG:   Orders placed or performed during the hospital encounter of 03/03/17  . EKG 12-Lead  . EKG 12-Lead    IMPRESSION AND PLAN:   * Left below-knee amputation. 300 ML blood loss. Monitor hemoglobin. Pain medications on board. Further management  as per vascular surgery.  * Shock likely due to blood loss. Patient does run low with her blood pressure and presently on phenylephrine. We will keep blood pressure over map of 60 overnight. Continue Midodrin from home. Hopefully can wean off pressors tomorrow morning.  * atrial fibrillation with rapid ventricular rate. Start oral Cardizem. Telemetry monitoring.  * End stage renal disease on hemodialysis. Consult nephrology for dialysis needs. Patient is on a Monday, Wednesday, Friday schedule. Reduce IV fluids.  * Anemia of chronic disease. Monitor.  * Sleep apnea. CPAP ordered.  * DVT prophylaxis On heparin.   All the records are reviewed and case discussed with Consulting provider. Management plans discussed with the patient, family and they are in agreement.  CODE STATUS: DNR Discussed CODE STATUS with patient with husband at bedside. patient is DO NOT RESUSCITATE and DO NOT INTUBATE. Orders entered.   TOTAL CC TIME TAKING CARE OF THIS PATIENT: 40 minutes.    [name], Artist  R M.D on 03/10/2017 at 6:11 PM  Between 7am to 6pm - Pager - [phone]  After 6pm go to [email or web address] - password [email or web address] [facility]  Office  [phone]  CC: Primary care Physician: Rusty Aus, MD  Note: This dictation was prepared with Dragon dictation along with smaller phrase technology. Any transcriptional errors that result from this process are unintentional.

## 2017-03-10 NOTE — H&P (Signed)
[facility] History & Physical Update  The patient was interviewed and re-examined.  The patient's previous History and Physical has been reviewed and is unchanged.  There is no change in the plan of care. We plan to proceed with the scheduled procedure of left BKA.  Leotis Pain, MD  03/10/2017, 11:55 AM

## 2017-03-10 NOTE — Progress Notes (Signed)
RN spoke with Dr. Darvin Neighbours and made MD aware that patient needs to wear CPAP at night and asked about MAP goal.  MD gave order for CPAP QHS and MAP goal > 60.

## 2017-03-10 NOTE — Anesthesia Post-op Follow-up Note (Cosign Needed)
Anesthesia QCDR form completed.        

## 2017-03-10 NOTE — Op Note (Signed)
OPERATIVE NOTE   PROCEDURE: Left below-the-knee amputation  PRE-OPERATIVE DIAGNOSIS: Left foot ulceration/osteomyelitis  POST-OPERATIVE DIAGNOSIS: same as above  SURGEON: Leotis Pain, MD  ASSISTANT(S): none  ANESTHESIA: general  ESTIMATED BLOOD LOSS: 300 cc  FINDING(S): none  SPECIMEN(S):  Left below-the-knee amputation  INDICATIONS:   [name] is a 83 y.o. female who presents with left leg ulceration and infection refractory to conservative therapy in a non-ambulatory patient.  The patient is scheduled for a left below-the-knee amputation.  I discussed in depth with the patient the risks, benefits, and alternatives to this procedure.  The patient is aware that the risk of this operation included but are not limited to:  bleeding, infection, myocardial infarction, stroke, death, failure to heal amputation wound, and possible need for more proximal amputation.  The patient is aware of the risks and agrees proceed forward with the procedure.  DESCRIPTION:  After full informed written consent was obtained from the patient, the patient was brought back to the operating room, and placed supine upon the operating table.  Prior to induction, the patient received IV antibiotics.  The patient was then prepped and draped in the standard fashion for a below-the-knee amputation.  After obtaining adequate anesthesia, the patient was prepped and draped in the standard fashion for a left below-the-knee amputation.  I marked out the anterior incision two finger breadths below the tibial tuberosity and then the marked out a posterior flap that was one third of the circumference of the calf in length.   I made the incisions for these flaps, and then dissected through the subcutaneous tissue, fascia, and muscle anteriorly.  I elevated  the periosteal tissue superiorly so that the tibia was about 3-4 cm shorter than the anterior skin flap.  I then transected the tibia with a power saw and then took a  wedge off the tibia anteriorly with the power saw.  Then I smoothed out the rough edges.  In a similar fashion, I cut back the fibula about two centimeters higher than the level of the tibia with a bone cutter.  I put a bone hook into the distal tibia and then used a large amputation knife to sharply develop a tissue plane through the muscle along the fibula.  In such fashion, the posterior flap was developed.  At this point, the specimen was passed off the field as the below-the-knee amputation.  At this point, I clamped all visibly bleeding arteries and veins using a combination of suture ligation with Silk suture and electrocautery.  Bleeding continued to be controlled with electrocautery and suture ligature.  The stump was washed off with sterile normal saline and no further active bleeding was noted.  I reapproximated the anterior and posterior fascia  with interrupted stitches of 0 Vicryl.  This was completed along the entire length of anterior and posterior fascia until there were no more loose space in the fascial line. I then placed a layer of 2-0 Vicryl sutures in the subcutaneous tissue. Due to the swelling and large size of her leg, 8-10 2-0 Nylon mattress sutures were used to help bring the skin together. The skin was then  reapproximated with staples.  The stump was washed off and dried.  The incision was dressed with Xeroform and  then fluffs were applied.  Kerlix was wrapped around the leg and then gently an ACE wrap was applied.    COMPLICATIONS: none  CONDITION: stable   Leotis Pain  03/10/2017, 2:25 PM  This note was created with Dragon Medical transcription system. Any errors in dictation are purely unintentional.

## 2017-03-10 NOTE — Progress Notes (Signed)
Patient resting at this time.  Arouses to voice.  Patient reports pain relief.  Blood pressure supported with Neo drip.  Afib per cardiac monitor.  o2 sats 100% on 2L.  Husband and other family visited.  No drainage on surgical dressing.

## 2017-03-10 NOTE — Progress Notes (Signed)
RN spoke with Dr. Darvin Neighbours and made MD aware that patient is on neo drip to support blood pressure and that patient is in afib rate 130's.  She has a history of afib.  Also Patient is having a lot of pain which she is getting pain meds for.  Dr. Darvin Neighbours stated to turn IVF down to 40 cc/H and that he would order PO cardizem.

## 2017-03-10 NOTE — Anesthesia Procedure Notes (Signed)
Procedure Name: Intubation Date/Time: 03/10/2017 12:25 PM Performed by: Aline Brochure Pre-anesthesia Checklist: Patient identified, Emergency Drugs available, Suction available and Patient being monitored Patient Re-evaluated:Patient Re-evaluated prior to inductionOxygen Delivery Method: Circle system utilized Preoxygenation: Pre-oxygenation with 100% oxygen Intubation Type: IV induction Ventilation: Mask ventilation without difficulty Laryngoscope Size: McGraph and 3 Grade View: Grade I Tube type: Oral Tube size: 7.0 mm Number of attempts: 1 Airway Equipment and Method: Video-laryngoscopy and Stylet Placement Confirmation: ETT inserted through vocal cords under direct vision,  positive ETCO2 and breath sounds checked- equal and bilateral Secured at: 21 cm Tube secured with: Tape Dental Injury: Teeth and Oropharynx as per pre-operative assessment  Difficulty Due To: Difficult Airway- due to anterior larynx and Difficulty was anticipated

## 2017-03-10 NOTE — Progress Notes (Signed)
Pt placed on [facility] CPAP C-4. CPAP plugged into red outlet. 2L O2 in line. 

## 2017-03-10 NOTE — Progress Notes (Signed)
RN spoke with Dr. Lucky Cowboy and made MD aware that PACU had difficulty with pain control and that patient has morphine ordered.  RN also asked about heparin subq being given today?  MD gave order for toradol scheduled and to start heparin tomorrow.

## 2017-03-10 NOTE — Anesthesia Preprocedure Evaluation (Addendum)
Anesthesia Evaluation  Patient identified by MRN, date of birth, ID band Patient awake    Reviewed: Allergy & Precautions, NPO status , Patient's Chart, lab work & pertinent test results  Airway Mallampati: III  TM Distance: <3 FB     Dental  (+) Caps, Chipped   Pulmonary asthma , sleep apnea , pneumonia, resolved,     + decreased breath sounds      Cardiovascular hypertension, Pt. on medications +CHF and + DOE  Normal cardiovascular exam+ dysrhythmias Atrial Fibrillation      Neuro/Psych negative neurological ROS  negative psych ROS   GI/Hepatic negative GI ROS, Neg liver ROS, GERD  Medicated,  Endo/Other  diabetes, Well Controlled, Type 2, Oral Hypoglycemic AgentsMorbid obesity  Renal/GU ESRFRenal disease  negative genitourinary   Musculoskeletal  (+) Arthritis , Osteoarthritis,    Abdominal (+) + obese,   Peds negative pediatric ROS (+)  Hematology  (+) anemia ,   Anesthesia Other Findings   Reproductive/Obstetrics                            Anesthesia Physical  Anesthesia Plan  ASA: IV  Anesthesia Plan: General   Post-op Pain Management:    Induction: Intravenous  Airway Management Planned: Oral ETT  Additional Equipment:   Intra-op Plan:   Post-operative Plan: Extubation in OR  Informed Consent: I have reviewed the patients History and Physical, chart, labs and discussed the procedure including the risks, benefits and alternatives for the proposed anesthesia with the patient or authorized representative who has indicated his/her understanding and acceptance.     Plan Discussed with: CRNA and Surgeon  Anesthesia Plan Comments:         Anesthesia Quick Evaluation

## 2017-03-11 ENCOUNTER — Encounter: Payer: Self-pay | Admitting: Vascular Surgery

## 2017-03-11 DIAGNOSIS — I959 Hypotension, unspecified: Secondary | ICD-10-CM

## 2017-03-11 LAB — BASIC METABOLIC PANEL
Anion gap: 12 (ref 5–15)
BUN: 15 mg/dL (ref 6–20)
CALCIUM: 8.5 mg/dL — AB (ref 8.9–10.3)
CO2: 24 mmol/L (ref 22–32)
CREATININE: 2.9 mg/dL — AB (ref 0.44–1.00)
Chloride: 95 mmol/L — ABNORMAL LOW (ref 101–111)
GFR calc non Af Amer: 14 mL/min — ABNORMAL LOW (ref >60)
GFR, EST AFRICAN AMERICAN: 16 mL/min — AB (ref >60)
GLUCOSE: 148 mg/dL — AB (ref 65–99)
Potassium: 4.2 mmol/L (ref 3.5–5.1)
Sodium: 131 mmol/L — ABNORMAL LOW (ref 135–145)

## 2017-03-11 LAB — CBC
HCT: 35.1 % (ref 35.0–47.0)
Hemoglobin: 11.5 g/dL — ABNORMAL LOW (ref 12.0–16.0)
MCH: 33.4 pg (ref 26.0–34.0)
MCHC: 32.7 g/dL (ref 32.0–36.0)
MCV: 102.2 fL — ABNORMAL HIGH (ref 80.0–100.0)
PLATELETS: 192 10*3/uL (ref 150–440)
RBC: 3.43 MIL/uL — ABNORMAL LOW (ref 3.80–5.20)
RDW: 17.6 % — AB (ref 11.5–14.5)
WBC: 13.7 10*3/uL — AB (ref 3.6–11.0)

## 2017-03-11 LAB — GLUCOSE, CAPILLARY
Glucose-Capillary: 88 mg/dL (ref 65–99)
Glucose-Capillary: 79 mg/dL (ref 65–99)

## 2017-03-11 MED ORDER — METOPROLOL TARTRATE 5 MG/5ML IV SOLN
2.5000 mg | INTRAVENOUS | Status: DC | PRN
  Administered 2017-03-14 – 2017-03-19 (×7): 2.5 mg via INTRAVENOUS
  Filled 2017-03-11 (×9): qty 5

## 2017-03-11 MED ORDER — FAMOTIDINE 20 MG PO TABS
20.0000 mg | ORAL_TABLET | Freq: Every day | ORAL | Status: DC
  Administered 2017-03-12 – 2017-03-14 (×3): 20 mg via ORAL
  Filled 2017-03-11 (×3): qty 1

## 2017-03-11 MED ORDER — SODIUM CHLORIDE 0.9 % IV BOLUS (SEPSIS)
250.0000 mL | Freq: Once | INTRAVENOUS | Status: AC
  Administered 2017-03-11: 250 mL via INTRAVENOUS

## 2017-03-11 MED ORDER — EPOETIN ALFA 10000 UNIT/ML IJ SOLN
4000.0000 [IU] | INTRAMUSCULAR | Status: DC
  Administered 2017-03-14 – 2017-03-18 (×3): 4000 [IU] via INTRAVENOUS
  Filled 2017-03-11: qty 1

## 2017-03-11 MED ORDER — MIDODRINE HCL 5 MG PO TABS
10.0000 mg | ORAL_TABLET | Freq: Three times a day (TID) | ORAL | Status: DC
  Administered 2017-03-11 – 2017-03-19 (×21): 10 mg via ORAL
  Filled 2017-03-11 (×5): qty 2
  Filled 2017-03-11: qty 1
  Filled 2017-03-11 (×15): qty 2

## 2017-03-11 MED ORDER — ORAL CARE MOUTH RINSE
15.0000 mL | Freq: Two times a day (BID) | OROMUCOSAL | Status: DC
  Administered 2017-03-11 – 2017-03-19 (×15): 15 mL via OROMUCOSAL

## 2017-03-11 MED ORDER — ALBUMIN HUMAN 25 % IV SOLN
12.5000 g | Freq: Once | INTRAVENOUS | Status: AC
  Administered 2017-03-11: 12.5 g via INTRAVENOUS
  Filled 2017-03-11: qty 50

## 2017-03-11 MED ORDER — INSULIN ASPART 100 UNIT/ML ~~LOC~~ SOLN
0.0000 [IU] | Freq: Three times a day (TID) | SUBCUTANEOUS | Status: DC
  Administered 2017-03-13 – 2017-03-14 (×4): 1 [IU] via SUBCUTANEOUS
  Administered 2017-03-16: 2 [IU] via SUBCUTANEOUS
  Administered 2017-03-16: 1 [IU] via SUBCUTANEOUS
  Administered 2017-03-16: 2 [IU] via SUBCUTANEOUS
  Administered 2017-03-17: 5 [IU] via SUBCUTANEOUS
  Administered 2017-03-17: 2 [IU] via SUBCUTANEOUS
  Administered 2017-03-17: 3 [IU] via SUBCUTANEOUS
  Administered 2017-03-18 – 2017-03-19 (×5): 2 [IU] via SUBCUTANEOUS
  Filled 2017-03-11: qty 3
  Filled 2017-03-11 (×3): qty 2
  Filled 2017-03-11: qty 1
  Filled 2017-03-11 (×2): qty 2
  Filled 2017-03-11: qty 1
  Filled 2017-03-11: qty 5
  Filled 2017-03-11: qty 1
  Filled 2017-03-11: qty 2
  Filled 2017-03-11 (×2): qty 1
  Filled 2017-03-11 (×2): qty 2

## 2017-03-11 NOTE — Progress Notes (Signed)
Post dialysis assessment 

## 2017-03-11 NOTE — Progress Notes (Signed)
Subjective:   Patient underwent Left BKA for non healing ulcer Currently being monitored in the ICU for post Op Hypotension requiring iv pressors Reports no complaints   Objective:  Vital signs in last 24 hours:  Temp:  [97.4 F (36.3 C)-97.9 F (36.6 C)] 97.7 F (36.5 C) (03/30 0400) Pulse Rate:  [103-141] 108 (03/30 0600) Resp:  [11-29] 19 (03/30 0600) BP: (75-119)/(38-77) 99/58 (03/30 0622) SpO2:  [91 %-100 %] 100 % (03/30 0600) Weight:  [95.5 kg (210 lb 9.6 oz)-106.1 kg (234 lb)] 95.5 kg (210 lb 9.6 oz) (03/29 1653)  Weight change:  Filed Weights   03/10/17 1043 03/10/17 1653  Weight: 106.1 kg (234 lb) 95.5 kg (210 lb 9.6 oz)    Intake/Output:    Intake/Output Summary (Last 24 hours) at 03/11/17 3474 Last data filed at 03/11/17 0400  Gross per 24 hour  Intake          1348.22 ml  Output              300 ml  Net          1048.22 ml     Physical Exam: General: Lating in bed, MAD  HEENT Moist oral mucus membranes, CPAP mask on  Neck supple  Pulm/lungs Normal effort, clear to auscultation  CVS/Heart Irregular, A Fib  Abdomen:  Soft, NT  Extremities: Left BKA, Rt leg in bandage,  + edema over thigh  Neurologic: A & O  Access: Left forearm AVF          Basic Metabolic Panel:   Recent Labs Lab 03/10/17 1052 03/10/17 1716 03/11/17 0338  NA 132*  --  131*  K 3.2*  --  4.2  CL  --   --  95*  CO2  --   --  24  GLUCOSE 133*  --  148*  BUN  --   --  15  CREATININE  --  2.56* 2.90*  CALCIUM  --   --  8.5*     CBC:  Recent Labs Lab 03/10/17 1052 03/10/17 1716 03/11/17 0613  WBC  --  14.3* 13.7*  HGB 12.6 10.9* 11.5*  HCT 37.0 32.9* 35.1  MCV  --  99.5 102.2*  PLT  --  184 192      Microbiology:  Recent Results (from the past 720 hour(s))  Aerobic Culture (superficial specimen)     Status: None   Collection Time: 02/22/17  3:50 PM  Result Value Ref Range Status   Specimen Description FOOT LEFT  Final   Special Requests CALCANEUS   Final   Gram Stain   Final    RARE WBC PRESENT, PREDOMINANTLY PMN ABUNDANT GRAM POSITIVE COCCI IN PAIRS ABUNDANT GRAM NEGATIVE COCCOBACILLI FEW YEAST Performed at [facility], [address], [place], [place] [address]    Culture   Final    ABUNDANT KLEBSIELLA PNEUMONIAE ABUNDANT ENTEROCOCCUS FAECALIS    Report Status 02/27/2017 FINAL  Final   Organism ID, Bacteria KLEBSIELLA PNEUMONIAE  Final   Organism ID, Bacteria ENTEROCOCCUS FAECALIS  Final      Susceptibility   Enterococcus faecalis - MIC*    AMPICILLIN <=2 SENSITIVE Sensitive     VANCOMYCIN 1 SENSITIVE Sensitive     GENTAMICIN SYNERGY RESISTANT Resistant     * ABUNDANT ENTEROCOCCUS FAECALIS   Klebsiella pneumoniae - MIC*    AMPICILLIN >=32 RESISTANT Resistant     CEFAZOLIN <=4 SENSITIVE Sensitive     CEFEPIME <=1 SENSITIVE Sensitive  CEFTAZIDIME <=1 SENSITIVE Sensitive     CEFTRIAXONE <=1 SENSITIVE Sensitive     CIPROFLOXACIN <=0.25 SENSITIVE Sensitive     GENTAMICIN <=1 SENSITIVE Sensitive     IMIPENEM <=0.25 SENSITIVE Sensitive     TRIMETH/SULFA <=20 SENSITIVE Sensitive     AMPICILLIN/SULBACTAM 4 SENSITIVE Sensitive     PIP/TAZO <=4 SENSITIVE Sensitive     Extended ESBL NEGATIVE Sensitive     * ABUNDANT KLEBSIELLA PNEUMONIAE  Surgical pcr screen     Status: Abnormal   Collection Time: 03/03/17  8:45 AM  Result Value Ref Range Status   MRSA, PCR POSITIVE (A) NEGATIVE Final    Comment: RESULT CALLED TO, READ BACK BY AND VERIFIED WITH: GAIL GRAVES 03/03/17 1140 SGD    Staphylococcus aureus POSITIVE (A) NEGATIVE Final    Comment:        The Xpert SA Assay (FDA approved for NASAL specimens in patients over 81 years of age), is one component of a comprehensive surveillance program.  Test performance has been validated by [facility] for patients greater than or equal to 76 year old. It is not intended to diagnose infection nor to guide or monitor treatment.     Coagulation Studies: No results for  input(s): LABPROT, INR in the last 72 hours.  Urinalysis: No results for input(s): COLORURINE, LABSPEC, PHURINE, GLUCOSEU, HGBUR, BILIRUBINUR, KETONESUR, PROTEINUR, UROBILINOGEN, NITRITE, LEUKOCYTESUR in the last 72 hours.  Invalid input(s): APPERANCEUR    Imaging: No results found.   Medications:   . phenylephrine (NEO-SYNEPHRINE) Adult infusion 140 mcg/min (03/11/17 0400)   . allopurinol  100 mg Oral Daily  . calcium acetate  667 mg Oral BID BM  . cholecalciferol  1,000 Units Oral Daily  . clindamycin (CLEOCIN) IV  300 mg Intravenous Q8H  . diltiazem  30 mg Oral Q8H  . docusate sodium  100 mg Oral BID  . famotidine (PEPCID) IV  20 mg Intravenous Q12H  . fluticasone  2 spray Each Nare QHS  . heparin  5,000 Units Subcutaneous Q8H  . insulin glargine  10 Units Subcutaneous QHS  . ketorolac  15 mg Intravenous Q8H  . mouth rinse  15 mL Mouth Rinse BID  . megestrol  40 mg Oral Daily  . midodrine  10 mg Oral 3 times per day on Sun Tue Thu Sat  . multivitamin  1 tablet Oral Daily  . pantoprazole  40 mg Oral Daily  . propranolol  40 mg Oral Daily  . sevelamer carbonate  1,600 mg Oral TID WC  . sevelamer carbonate  800 mg Oral BID BM   acetaminophen **OR** acetaminophen, alum & mag hydroxide-simeth, fentaNYL (SUBLIMAZE) injection, guaiFENesin-dextromethorphan, hydrALAZINE, HYDROcodone-acetaminophen, ipratropium-albuterol, labetalol, magnesium sulfate 1 - 4 g bolus IVPB, metoprolol, morphine injection, ondansetron (ZOFRAN) IV, ondansetron, oxyCODONE-acetaminophen, phenol, polyethylene glycol, potassium chloride, promethazine, senna, sorbitol, temazepam, traMADol  Assessment/ Plan:  83 y.o. female   with end-stage renal disease on hemodialysis, diabetes mellitus type 2, hypertension, obstructive sleep apnea, atrial fibrillation, arthritis, anemia, distal right femoral fracture, admitted on 09/26/2016 for Leg edema [R60.0] Venous thrombosis [I82.90] ,rt leg DVT  MWF [place]  1. End-stage renal disease-   Will arrange for dialysis while in the hospital  2.  Hypotension:  - currently requiring ov phenylephrine   3. Anemia of chronic kidney disease - Hgb 11.5 - low dose EPO   4. Secondary hyperparathyroidism:   monitor phos  5. LE edema D/c iv fluids UF with dialysis; iv albumin support  LOS: 1 Kalin Amrhein 3/30/20188:14 AM

## 2017-03-11 NOTE — Progress Notes (Signed)
Postoperative day #1 status post BKA   S: Patient states [name] has had good pain control, "I only needed one shot last night". [name] tolerated her breakfast this a.m.  O:  [name] is seen while on dialysis. Systolic pressure in the mid 80s. Heart rate in the 60s. O2 saturation 100%. Dressing is clean dry and intact. Flow rates from her left arm AV graft are 400  A: Status post left below-knee amputation  P:  Patient is doing well postop. My plan was to transfer her to the regular floor today however toward the end of dialysis her systolic pressures decreased slightly. I've asked the intensivist to evaluate the patient [name] was given a 250 cc bolus of normal saline I appreciate intensive care's input her blood pressure medicines have been adjusted we will observe her overnight and my hope [name] will be able to transfer to the regular floor tomorrow
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
## 2017-03-11 NOTE — Progress Notes (Signed)
OT Cancellation Note  Patient Details Name: [name] MRN: [id number] DOB: 12-11-1934   Cancelled Treatment:    Reason Eval/Treat Not Completed: Medical issues which prohibited therapy (Pt. receiving Dialysis. Will conitnue to monitor and eval as appropriate.Marland KitchenHarrel Carina, MS, OTR/L 03/11/2017, 1:59 PM

## 2017-03-11 NOTE — Progress Notes (Signed)
In to patients room to discuss movement to 2C.  PT found alert and disoriented. Speaking clearly, however unable to state location, Year, or Software engineer.  MD Shcneer made aware, he ordered 289ml NS bolus, and a consult for CCM.

## 2017-03-11 NOTE — Progress Notes (Signed)
Hd completed 

## 2017-03-11 NOTE — Progress Notes (Signed)
CONCERNING: IV to Oral Route Change Policy  RECOMMENDATION: This patient is receiving famotidine by the intravenous route.  Based on criteria approved by the Pharmacy and Therapeutics Committee, the intravenous medication(s) is/are being converted to the equivalent oral dose form(s).   DESCRIPTION: These criteria include:  The patient is eating (either orally or via tube) and/or has been taking other orally administered medications for a least 24 hours  The patient has no evidence of active gastrointestinal bleeding or impaired GI absorption (gastrectomy, short bowel, patient on TNA or NPO).  If you have questions about this conversion, please contact the Pharmacy Department  []   [phone] )  [facility] [x]   [phone] )  [facility] []   [phone] )  [phone] []   [phone] )  [facility] []   [phone] )  [place], [facility] 03/11/2017 8:35 PM

## 2017-03-11 NOTE — Progress Notes (Signed)
Patient currently resting in bed.  Complaints of left leg pain earlier this shift.  Medicated for pain with relief.  Surgical site and right foot elevated on pillows. Patient remains on Neo continuously being titered to keep MAP>60.  Currently on 140 mcg/min and MAP remains around 60-62. Utilizing CPAP machine during sleep. Had visitors at the beginning of shift. Tolerated ice chips well without nausea.

## 2017-03-11 NOTE — Progress Notes (Signed)
HD STARTED  

## 2017-03-11 NOTE — Consult Note (Signed)
Name: [name] MRN: [id number] DOB: 1934-05-05    ADMISSION DATE:  03/10/2017 CONSULTATION DATE: 03/11/2017   REFERRING MD :  Dr. Delana Meyer  CHIEF COMPLAINT: Hypotension and End Stage Renal Disease   BRIEF PATIENT DESCRIPTION:  83 yo female who presented to [facility] 03/29 for scheduled left below the knee amputation during procedure pt went into afibb with rvr and became hypotensive requiring pressors, therefore she was admitted to ICU s/p surgery.  PCCM consulted 03/30 for additional management of hypotension with ESRD  SIGNIFICANT EVENTS  03/29-Pt admitted to ICU s/p surgery 03/30-PCCM consulted for additional management of hypotension  STUDIES:  None  HISTORY OF PRESENT ILLNESS:   This is an 83 yo female with a PMH of Skin Cancer, Renal Insufficiency, OSA,(CPAP qhs), Osteoarthritis, HTN, GERD, Essential Tremor, ESRD-Hemodialysis M-W-F, Dysrhythmias, Diabetes Mellitus, CHF, Bilateral Lower Extremity Edema, Asthma, Anemia, and Afibb.  She presented to [facility] 03/29 for a scheduled left BKA due to underlying osteomyelitis with extensive tissue loss of the left foot.  During the procedure the pt went into Afibb with RVR and became hypotensive requiring pressors. She also had a blood loss of 300 ml and received 650 ml fluid in the OR.  At baseline the pts systolic bp ranges between 80-100.  She was subsequently admitted to ICU.  PCCM consulted 03/30 for further management of hypotension with ESRD.    PAST MEDICAL HISTORY :   has a past medical history of A-fib ([place]); Anemia; Asthma; Bilateral lower extremity edema; CHF (congestive heart failure) ([place]); Diabetes mellitus without complication ([place]); Dysrhythmia; ESRD (end stage renal disease) ([place]); Essential tremor; GERD (gastroesophageal reflux disease); HTN (hypertension); OSA (obstructive sleep apnea); Osteoarthritis; Renal insufficiency; and Skin cancer.  has a past surgical history that includes Abdominal hysterectomy; Cholecystectomy;  Femur fracture surgery; Tubal ligation; Ankle fracture surgery; Neuroplasty / transposition median nerve at carpal tunnel bilateral; Knee Arthroplasty; Cardiac catheterization (Left, 08/06/2015); Esophagogastroduodenoscopy (egd) with propofol (N/A, 10/09/2015); Cardiac catheterization (Left, 11/20/2015); Cardiac catheterization (N/A, 11/20/2015); Cardiac catheterization (N/A, 09/27/2016); Cardiac catheterization (Left, 01/03/2017); Cardiac catheterization (N/A, 01/03/2017); Joint replacement; Fracture surgery; Vascular surgery; and Amputation (Left, 03/10/2017). Prior to Admission medications   Medication Sig Start Date End Date Taking? Authorizing Provider  allopurinol (ZYLOPRIM) 100 MG tablet Take 100 mg by mouth daily.    Yes Historical Provider, MD  calcium acetate (PHOSLO) 667 MG capsule Take 1 capsule (667 mg total) by mouth 2 (two) times daily between meals. 04/10/16  Yes Vaughan Basta, MD  cholecalciferol (VITAMIN D) 1000 units tablet Take 1,000 Units by mouth daily.   Yes Historical Provider, MD  docusate sodium (COLACE) 100 MG capsule Take 1 capsule (100 mg total) by mouth 2 (two) times daily. 04/10/16  Yes Vaughan Basta, MD  fluticasone (FLONASE) 50 MCG/ACT nasal spray Place 2 sprays into both nostrils at bedtime.   Yes Historical Provider, MD  folic acid-vitamin b complex-vitamin c-selenium-zinc (DIALYVITE) 3 MG TABS tablet Take 1 tablet by mouth daily.   Yes Historical Provider, MD  HYDROcodone-acetaminophen (NORCO/VICODIN) 5-325 MG tablet Take 1 tablet by mouth 2 (two) times daily as needed. 11/29/16  Yes Historical Provider, MD  insulin glargine (LANTUS) 100 UNIT/ML injection Inject 0.08 mLs (8 Units total) into the skin at bedtime. Patient taking differently: Inject 10 Units into the skin at bedtime.  09/21/16  Yes Gladstone Lighter, MD  ipratropium-albuterol (DUONEB) 0.5-2.5 (3) MG/3ML SOLN Take 3 mLs by nebulization 4 (four) times daily as needed (for wheezing/shortness of  breath).   Yes Historical  Provider, MD  lidocaine-prilocaine (EMLA) cream Apply 1 application topically as needed. Apply to access site (left forearm) 1 hour prior to dialysis and cover with clear wrap. Mon, wed, fri 0500   Yes Historical Provider, MD  megestrol (MEGACE) 40 MG tablet Take 40 mg by mouth daily.   Yes Historical Provider, MD  midodrine (PROAMATINE) 10 MG tablet Take 1 tablet (10 mg total) by mouth 3 (three) times daily with meals. Patient taking differently: Take 10 mg by mouth 3 (three) times daily with meals. [place] Reek, thu, sat - 0900,1800,2100 09/21/16  Yes Gladstone Lighter, MD  pantoprazole (PROTONIX) 40 MG tablet Take 40 mg by mouth daily.   Yes Historical Provider, MD  promethazine (PHENERGAN) 25 MG tablet Take 25 mg by mouth every 6 (six) hours as needed for nausea or vomiting.    Yes Historical Provider, MD  propranolol (INDERAL) 80 MG tablet Take 40 mg by mouth daily. 01/18/17  Yes Historical Provider, MD  RENVELA 800 MG tablet Take 800-1,600 mg by mouth 5 (five) times daily. Pt takes two tablets three times daily with meals and one tablet two times daily with snacks. 06/30/15  Yes Historical Provider, MD  senna (SENOKOT) 8.6 MG TABS tablet Take 2 tablets (17.2 mg total) by mouth daily as needed for mild constipation. 04/10/16  Yes Vaughan Basta, MD  temazepam (RESTORIL) 15 MG capsule Take 15 mg by mouth at bedtime as needed for sleep.   Yes Historical Provider, MD  traMADol (ULTRAM) 50 MG tablet Take 1 tablet (50 mg total) by mouth every 6 (six) hours as needed for moderate pain or severe pain. 09/21/16  Yes Gladstone Lighter, MD   Allergies  Allergen Reactions  . Angiotensin Receptor Blockers Other (See Comments)    Reaction:  Hyperkalemia  . Penicillins Rash and Other (See Comments)    rash    FAMILY HISTORY:  family history includes Breast cancer in her maternal grandmother; Breast cancer (age of onset: 59) in her sister; Diabetes type II in her father. SOCIAL  HISTORY:  reports that she has never smoked. She has never used smokeless tobacco. She reports that she does not drink alcohol or use drugs.  REVIEW OF SYSTEMS:  Positives in BOLD Constitutional: Negative for fever, chills, weight loss, malaise/fatigue and diaphoresis.  HENT: Negative for hearing loss, ear pain, nosebleeds, congestion, sore throat, neck pain, tinnitus and ear discharge.   Eyes: Negative for blurred vision, double vision, photophobia, pain, discharge and redness.  Respiratory: cough, hemoptysis, sputum production, occasional shortness of breath, wheezing and stridor.   Cardiovascular: chest pain, palpitations, orthopnea, claudication, leg swelling and PND.  Gastrointestinal: Negative for heartburn, nausea, vomiting, abdominal pain, diarrhea, constipation, blood in stool and melena.  Genitourinary: Negative for dysuria, urgency, frequency, hematuria and flank pain.  Musculoskeletal: Negative for myalgias, back pain, joint pain and falls.  Skin: Negative for itching and rash.  Neurological: dizziness, tingling, tremors, sensory change, speech change, focal weakness, seizures, loss of consciousness, weakness and headaches.  Endo/Heme/Allergies: Negative for environmental allergies and polydipsia. Does not bruise/bleed easily.  SUBJECTIVE:  Pt states she occasionally becomes short of breath her primary complaint is left lower extremity pain at BKA site   VITAL SIGNS: Temp:  [97.4 F (36.3 C)-99.7 F (37.6 C)] 97.6 F (36.4 C) (03/30 1345) Pulse Rate:  [90-141] 99 (03/30 1530) Resp:  [11-28] 17 (03/30 1015) BP: (51-119)/(34-77) 92/63 (03/30 1530) SpO2:  [91 %-100 %] 97 % (03/30 1500) Weight:  [95.5 kg (210 lb 9.6 oz)-98.2  kg (216 lb 7.9 oz)] 97 kg (213 lb 13.5 oz) (03/30 1345)  PHYSICAL EXAMINATION: General: well developed obese Caucasian female, NAD Neuro: alert and oriented, follows commands, PERRLA HEENT: supple, no JVD Cardiovascular: irregular, irregular, no  M/R/G Lungs: clear throughout, even, non labored Abdomen: +BS x4, soft, obese, non tender, non distended Musculoskeletal: left BKA, 2+ right lower extremity edema  Skin: left BKA dressing dry and intact, right lower extremity una boot dry and intact    Recent Labs Lab 03/10/17 1052 03/10/17 1716 03/11/17 0338  NA 132*  --  131*  K 3.2*  --  4.2  CL  --   --  95*  CO2  --   --  24  BUN  --   --  15  CREATININE  --  2.56* 2.90*  GLUCOSE 133*  --  148*    Recent Labs Lab 03/10/17 1052 03/10/17 1716 03/11/17 0613  HGB 12.6 10.9* 11.5*  HCT 37.0 32.9* 35.1  WBC  --  14.3* 13.7*  PLT  --  184 192   No results found.  ASSESSMENT / PLAN: S/p Left Below Knee Amputation Postop Hypotension with baseline chronic hypotension  Afibb with RVR-improving  Hx: ESRD-Hemodialysis M-W-F, OSA (CPP qhs) P: Supplemental O2 to maintain O2 sats >92% or for dyspnea.  CPAP qhs Prn neosynephrine to maintain map >55 Increased frequency of midodrine to 3 times daily Discontinued scheduled propranolol and cardizem Discontinued prn labetalol  Prn metoprolol for hr >120 If unable to control hr add scheduled amiodarone  Nephrology consulted appreciate input hemodialysis per recommendations   Marda Stalker, [place] Pager [phone] (please enter 7 digits) [place] Pager [phone] (please enter 7 digits)   Merton Border, MD PCCM service Mobile [phone] Pager [phone] 03/12/2017

## 2017-03-11 NOTE — Progress Notes (Signed)
PRE DIALYSIS ASSESSMENT 

## 2017-03-11 NOTE — Care Management (Signed)
Patient had left below knee amputation. ESRD and known to [facility] MWF.  Notified Elvera Bicker with [facility].  Physical therapy is recommending skilled nursing facility placement.

## 2017-03-11 NOTE — Progress Notes (Signed)
Pt placed on [facility] CPAP C-4. CPAP plugged into red outlet with 2L O2 in line. 

## 2017-03-11 NOTE — Consult Note (Addendum)
[place] Nurse wound consult note Reason for Consult: RLE chronic nonhealing wounds. Seen at [facility].   S/P L BKA, will be managed by surgical services.  Right facial lesion, scabbed.  Caused by CPAP machine at home, per patient.  WIll need padding with silicone border foam dressing for protection with CPAP.  Wound type:Chronic nonhealing. Pressure Injury POA: Yes Measurement:Right dorsal foot:  4 cm x 2.8 cm with visible tendon. Right posterior lower leg/Achilles tendon region Wound JQZ:ESPQZR and 75% devitalized tissue. Drainage (amount, consistency, odor) Moderate serosanguinous  Musty odor.  Periwound:Dry skin.  Chronic changes, edema Dressing procedure/placement/frequency:Cleanse right leg with soap and water.  Aquacel Ag to wound bed.  Cover with kerlix layer from below toes to below knee.  Secure with Coban layer.  Change three times weekly.  Mon/Wed/Fri. [place] team will follow.  Domenic Moras RN BSN [place] Pager [phone]

## 2017-03-11 NOTE — Progress Notes (Signed)
[facility] [place] NAME: [name]    MR#:  [id number]  DATE OF BIRTH:  02/23/1934  SUBJECTIVE:   Patient tends to have low blood pressure. She is asymptomatic. He is currently receiving dialysis  REVIEW OF SYSTEMS:    Review of Systems  Constitutional: Negative.  Negative for chills, fever and malaise/fatigue.  HENT: Negative.  Negative for ear discharge, ear pain, hearing loss, nosebleeds and sore throat.   Eyes: Negative.  Negative for blurred vision and pain.  Respiratory: Negative.  Negative for cough, hemoptysis, shortness of breath and wheezing.   Cardiovascular: Negative.  Negative for chest pain, palpitations and leg swelling.  Gastrointestinal: Negative.  Negative for abdominal pain, blood in stool, diarrhea, nausea and vomiting.  Genitourinary: Negative.  Negative for dysuria.  Musculoskeletal: Negative.  Negative for back pain.  Skin: Negative.   Neurological: Negative for dizziness, tremors, speech change, focal weakness, seizures and headaches.  Endo/Heme/Allergies: Negative.  Does not bruise/bleed easily.  Psychiatric/Behavioral: Negative.  Negative for depression, hallucinations and suicidal ideas.    Tolerating Diet: yes      DRUG ALLERGIES:   Allergies  Allergen Reactions  . Angiotensin Receptor Blockers Other (See Comments)    Reaction:  Hyperkalemia  . Penicillins Rash and Other (See Comments)    rash    VITALS:  Blood pressure (!) 78/50, pulse (!) 101, temperature 99.7 F (37.6 C), temperature source Axillary, resp. rate 17, height 5\' 1"  (1.549 m), weight 98.2 kg (216 lb 7.9 oz), SpO2 100 %.  PHYSICAL EXAMINATION:   Physical Exam  Constitutional: She is oriented to person, place, and time and well-developed, well-nourished, and in no distress. No distress.  HENT:  Head: Normocephalic.  Eyes: No scleral icterus.  Neck: Normal range of motion. Neck supple. No JVD present. No tracheal deviation  present.  Cardiovascular: Normal rate, regular rhythm and normal heart sounds.  Exam reveals no gallop and no friction rub.   No murmur heard. Pulmonary/Chest: Effort normal and breath sounds normal. No respiratory distress. She has no wheezes. She has no rales. She exhibits no tenderness.  Abdominal: Soft. Bowel sounds are normal. She exhibits no distension and no mass. There is no tenderness. There is no rebound and no guarding.  Musculoskeletal: She exhibits no edema.  Left BKA with dressing in place  Neurological: She is alert and oriented to person, place, and time.  Skin: Skin is warm. No rash noted. No erythema.  Psychiatric: Affect and judgment normal.      LABORATORY PANEL:   CBC  Recent Labs Lab 03/11/17 0613  WBC 13.7*  HGB 11.5*  HCT 35.1  PLT 192   ------------------------------------------------------------------------------------------------------------------  Chemistries   Recent Labs Lab 03/11/17 0338  NA 131*  K 4.2  CL 95*  CO2 24  GLUCOSE 148*  BUN 15  CREATININE 2.90*  CALCIUM 8.5*   ------------------------------------------------------------------------------------------------------------------  Cardiac Enzymes No results for input(s): TROPONINI in the last 168 hours. ------------------------------------------------------------------------------------------------------------------  RADIOLOGY:  No results found.   ASSESSMENT AND PLAN:   83 year old female with ESRD postop left below-knee amputation for left foot ulceration/osteomyelitis.  1. Postoperative day #1 left below the knee amputation: Management as per surgery  2. Postop hypotension on baseline chronic hypotension: Patient is now off pressors Continue Midodrine  3. ESRD on hemodialysis  4. A. fib with RVR: Heart rate better control: When necessary ranitidine  5. Diabetes: Continue Lantus with ADA diet and sliding scale     Management  plans discussed with the patient  and she is in agreement.  CODE STATUS: dnr  TOTAL TIME TAKING CARE OF THIS PATIENT: 27 minutes.     POSSIBLE D/C 1-3 days, DEPENDING ON CLINICAL CONDITION.   Rube Sanchez M.D on 03/11/2017 at 12:30 PM  Between 7am to 6pm - Pager - [phone] After 6pm go to [email or web address] - password [email or web address] [place]  Office  [phone]  CC: Primary care physician; Rusty Aus, MD  Note: This dictation was prepared with Dragon dictation along with smaller phrase technology. Any transcriptional errors that result from this process are unintentional.

## 2017-03-12 ENCOUNTER — Inpatient Hospital Stay: Payer: Medicare Other

## 2017-03-12 DIAGNOSIS — T8111XD Postprocedural  cardiogenic shock, subsequent encounter: Secondary | ICD-10-CM

## 2017-03-12 DIAGNOSIS — R748 Abnormal levels of other serum enzymes: Secondary | ICD-10-CM

## 2017-03-12 LAB — BASIC METABOLIC PANEL
ANION GAP: 12 (ref 5–15)
BUN: 12 mg/dL (ref 6–20)
CALCIUM: 8.6 mg/dL — AB (ref 8.9–10.3)
CHLORIDE: 100 mmol/L — AB (ref 101–111)
CO2: 24 mmol/L (ref 22–32)
CREATININE: 2.24 mg/dL — AB (ref 0.44–1.00)
GFR calc non Af Amer: 19 mL/min — ABNORMAL LOW (ref >60)
GFR, EST AFRICAN AMERICAN: 22 mL/min — AB (ref >60)
GLUCOSE: 111 mg/dL — AB (ref 65–99)
Potassium: 4.6 mmol/L (ref 3.5–5.1)
Sodium: 136 mmol/L (ref 135–145)

## 2017-03-12 LAB — CBC
HCT: 30.8 % — ABNORMAL LOW (ref 35.0–47.0)
HEMOGLOBIN: 10.1 g/dL — AB (ref 12.0–16.0)
MCH: 33.2 pg (ref 26.0–34.0)
MCHC: 32.7 g/dL (ref 32.0–36.0)
MCV: 101.5 fL — AB (ref 80.0–100.0)
Platelets: 219 10*3/uL (ref 150–440)
RBC: 3.04 MIL/uL — AB (ref 3.80–5.20)
RDW: 17.5 % — ABNORMAL HIGH (ref 11.5–14.5)
WBC: 13.3 10*3/uL — ABNORMAL HIGH (ref 3.6–11.0)

## 2017-03-12 LAB — GLUCOSE, CAPILLARY
GLUCOSE-CAPILLARY: 97 mg/dL (ref 65–99)
GLUCOSE-CAPILLARY: 107 mg/dL — AB (ref 65–99)
Glucose-Capillary: 89 mg/dL (ref 65–99)
Glucose-Capillary: 133 mg/dL — ABNORMAL HIGH (ref 65–99)

## 2017-03-12 LAB — HEPATITIS B SURFACE ANTIBODY, QUANTITATIVE

## 2017-03-12 LAB — HEPATITIS B SURFACE ANTIGEN: HEP B S AG: NEGATIVE

## 2017-03-12 LAB — TROPONIN I: Troponin I: 2.23 ng/mL (ref <0.03)

## 2017-03-12 MED ORDER — MORPHINE SULFATE (PF) 4 MG/ML IV SOLN
2.0000 mg | INTRAVENOUS | Status: DC | PRN
  Administered 2017-03-14 – 2017-03-15 (×3): 2 mg via INTRAVENOUS
  Filled 2017-03-12 (×2): qty 1

## 2017-03-12 MED ORDER — OXYCODONE-ACETAMINOPHEN 5-325 MG PO TABS
1.0000 | ORAL_TABLET | ORAL | Status: DC | PRN
  Administered 2017-03-14 – 2017-03-17 (×2): 1 via ORAL
  Filled 2017-03-12 (×2): qty 1

## 2017-03-12 MED ORDER — HYDROCORTISONE NA SUCCINATE PF 100 MG IJ SOLR
50.0000 mg | Freq: Two times a day (BID) | INTRAMUSCULAR | Status: DC
  Administered 2017-03-12 – 2017-03-14 (×6): 50 mg via INTRAVENOUS
  Filled 2017-03-12 (×6): qty 2

## 2017-03-12 MED ORDER — NEPRO/CARBSTEADY PO LIQD
237.0000 mL | Freq: Two times a day (BID) | ORAL | Status: DC
  Administered 2017-03-12: 60 mL via ORAL
  Administered 2017-03-14 – 2017-03-19 (×4): 237 mL via ORAL

## 2017-03-12 NOTE — Progress Notes (Signed)
Patient heart rate sustaining into the 110's-120's chronic Afib rate. Patient tolerating rate, resting comfortably in bed. Patient still currently on Neo drip for hypotension. Notified the hospitalist of the heart rate and patient condition. No new orders were received at this time. Will continue to monitor and assess the patient.

## 2017-03-12 NOTE — Progress Notes (Signed)
Confused. Follows commands. Slept most of day and reported up all night. On 2L/[place] with good sats. Lungs decreased bibasilar.  Afib rate 90-115/min. Hypotension treated with midodrine and Neo gtt currently at 24mcg., Reported that her normal bp prior to admission was often in low 80's. No bleeding from stump. LFA fistula with strong bruit. Dialysis yesterday with 736ml removal. Incontinent of urine x1.  Seen today by hospitalist, vascular surgery,intensivist, and renal services.  Dr Alva Garnet spoke with pt family regarding goals of care. Pt a DNR/DNI. Several family members by to see today.

## 2017-03-12 NOTE — Progress Notes (Signed)
Lethargic. No distress. Reports LLE pain. Oriented to person and place  Vitals:   03/12/17 1130 03/12/17 1200 03/12/17 1230 03/12/17 1245  BP: (!) 76/30 (!) 76/36 (!) 83/38 (!) 80/49  Pulse: (!) 107  (!) 108 (!) 108  Resp: 14 16 18 15   Temp:      TempSrc:      SpO2: 95%  100% 100%  Weight:      Height:       Chronically ill appearing, RASS -1 HEENT WNL JVP Not well visualized Chest clear anteriorly IRIR, soft holosystolic murmur radiating to apex Obese, soft, diminished bowel sounds Status post left lower extremity BKA, moderate pretibial edema on the right No focal neurologic deficits  BMP Latest Ref Rng & Units 03/12/2017 03/11/2017 03/10/2017  Glucose 65 - 99 mg/dL 111(H) 148(H) 133(H)  BUN 6 - 20 mg/dL 12 15 -  Creatinine 0.44 - 1.00 mg/dL 2.24(H) 2.90(H) 2.56(H)  Sodium 135 - 145 mmol/L 136 131(L) 132(L)  Potassium 3.5 - 5.1 mmol/L 4.6 4.2 3.2(L)  Chloride 101 - 111 mmol/L 100(L) 95(L) -  CO2 22 - 32 mmol/L 24 24 -  Calcium 8.9 - 10.3 mg/dL 8.6(L) 8.5(L) -   CBC Latest Ref Rng & Units 03/12/2017 03/11/2017 03/10/2017  WBC 3.6 - 11.0 K/uL 13.3(H) 13.7(H) 14.3(H)  Hemoglobin 12.0 - 16.0 g/dL 10.1(L) 11.5(L) 10.9(L)  Hematocrit 35.0 - 47.0 % 30.8(L) 35.1 32.9(L)  Platelets 150 - 440 K/uL 219 192 184   Trop I 2.23  No recent CXR  EKG: diffuse NSST changes  IMPRESSION: ESRD Peripheral arterial disease Chronic hypotension with systolics in the 79-390 range Chronic atrial fibrillation, rate adequately controlled presently Elevated troponin I Refractory hypotension- NIBP remains low despite phenylephrine240 mcg/min DNR/DNI  PLAN/REC: I spoke with pt's son, Patrick Jupiter, who is HCPOA. He is very reasonable but also wishes to not "give up" if there is a chance of survival.   Upper limit on phenylephrine established @ 300 mcg/min. If she seems to stabilize with this, we can consider CVL placement (would place trialysis catheter if CRRT planned)  DNR/DNI confirmed   Continue  midodrine  Hydrocortisone ordered  Echocardiogram ordered  Recheck trop I in AM 04/01   Merton Border, MD PCCM service Mobile [phone] Pager [phone] 03/12/2017

## 2017-03-12 NOTE — Progress Notes (Signed)
OT Cancellation Note  Patient Details Name: [name] MRN: [id number] DOB: 11-Dec-1934   Cancelled Treatment:    Reason Eval/Treat Not Completed: Medical issues which prohibited therapy. Order received, chart reviewed. RN with pt upon attempt to evaluate, notes pt did not sleep last night, very fatigued and confused this am (not her baseline). Unable to meaningfully participate in therapy at this time. RN recommending hold pt for OT evaluation at this time. OT will continue to monitor and re-attempt OT evaluation at later date/time as appropriate.  Jeni Salles, MPH, MS, OTR/L ascom [phone] 03/12/17, 8:52 AM

## 2017-03-12 NOTE — Progress Notes (Signed)
Subjective:   Patient underwent Left BKA for non healing ulcer Currently being monitored in the ICU for post Op Hypotension requiring iv pressors Reports no complaints   Objective:  Vital signs in last 24 hours:  Temp:  [97.2 F (36.2 C)-98.2 F (36.8 C)] 97.2 F (36.2 C) (03/31 0830) Pulse Rate:  [92-114] 107 (03/31 1130) Resp:  [10-35] 14 (03/31 1130) BP: (51-94)/(28-82) 76/30 (03/31 1130) SpO2:  [90 %-100 %] 95 % (03/31 1130) Weight:  [97 kg (213 lb 13.5 oz)] 97 kg (213 lb 13.5 oz) (03/30 1345)  Weight change: -7.942 kg (-17 lb 8.1 oz) Filed Weights   03/10/17 1653 03/11/17 1015 03/11/17 1345  Weight: 95.5 kg (210 lb 9.6 oz) 98.2 kg (216 lb 7.9 oz) 97 kg (213 lb 13.5 oz)    Intake/Output:    Intake/Output Summary (Last 24 hours) at 03/12/17 1205 Last data filed at 03/12/17 1100  Gross per 24 hour  Intake          1859.96 ml  Output              760 ml  Net          1099.96 ml     Physical Exam: General: Laying in bed, MAD  HEENT Moist oral mucus membranes,    Neck supple  Pulm/lungs Normal effort, clear to auscultation  CVS/Heart Irregular, A Fib  Abdomen:  Soft, NT  Extremities: Left BKA, Rt leg in bandage,  + edema over thigh  Neurologic: A & O  Access: Left forearm AVF          Basic Metabolic Panel:   Recent Labs Lab 03/10/17 1052 03/10/17 1716 03/11/17 0338 03/12/17 0502  NA 132*  --  131* 136  K 3.2*  --  4.2 4.6  CL  --   --  95* 100*  CO2  --   --  24 24  GLUCOSE 133*  --  148* 111*  BUN  --   --  15 12  CREATININE  --  2.56* 2.90* 2.24*  CALCIUM  --   --  8.5* 8.6*     CBC:  Recent Labs Lab 03/10/17 1052 03/10/17 1716 03/11/17 0613 03/12/17 0628  WBC  --  14.3* 13.7* 13.3*  HGB 12.6 10.9* 11.5* 10.1*  HCT 37.0 32.9* 35.1 30.8*  MCV  --  99.5 102.2* 101.5*  PLT  --  184 192 219      Microbiology:  Recent Results (from the past 720 hour(s))  Aerobic Culture (superficial specimen)     Status: None   Collection  Time: 02/22/17  3:50 PM  Result Value Ref Range Status   Specimen Description FOOT LEFT  Final   Special Requests CALCANEUS  Final   Gram Stain   Final    RARE WBC PRESENT, PREDOMINANTLY PMN ABUNDANT GRAM POSITIVE COCCI IN PAIRS ABUNDANT GRAM NEGATIVE COCCOBACILLI FEW YEAST Performed at [facility], [address], [place], [place] [address]    Culture   Final    ABUNDANT KLEBSIELLA PNEUMONIAE ABUNDANT ENTEROCOCCUS FAECALIS    Report Status 02/27/2017 FINAL  Final   Organism ID, Bacteria KLEBSIELLA PNEUMONIAE  Final   Organism ID, Bacteria ENTEROCOCCUS FAECALIS  Final      Susceptibility   Enterococcus faecalis - MIC*    AMPICILLIN <=2 SENSITIVE Sensitive     VANCOMYCIN 1 SENSITIVE Sensitive     GENTAMICIN SYNERGY RESISTANT Resistant     * ABUNDANT ENTEROCOCCUS FAECALIS   Klebsiella  pneumoniae - MIC*    AMPICILLIN >=32 RESISTANT Resistant     CEFAZOLIN <=4 SENSITIVE Sensitive     CEFEPIME <=1 SENSITIVE Sensitive     CEFTAZIDIME <=1 SENSITIVE Sensitive     CEFTRIAXONE <=1 SENSITIVE Sensitive     CIPROFLOXACIN <=0.25 SENSITIVE Sensitive     GENTAMICIN <=1 SENSITIVE Sensitive     IMIPENEM <=0.25 SENSITIVE Sensitive     TRIMETH/SULFA <=20 SENSITIVE Sensitive     AMPICILLIN/SULBACTAM 4 SENSITIVE Sensitive     PIP/TAZO <=4 SENSITIVE Sensitive     Extended ESBL NEGATIVE Sensitive     * ABUNDANT KLEBSIELLA PNEUMONIAE  Surgical pcr screen     Status: Abnormal   Collection Time: 03/03/17  8:45 AM  Result Value Ref Range Status   MRSA, PCR POSITIVE (A) NEGATIVE Final    Comment: RESULT CALLED TO, READ BACK BY AND VERIFIED WITH: GAIL GRAVES 03/03/17 1140 SGD    Staphylococcus aureus POSITIVE (A) NEGATIVE Final    Comment:        The Xpert SA Assay (FDA approved for NASAL specimens in patients over 36 years of age), is one component of a comprehensive surveillance program.  Test performance has been validated by [facility] for patients greater than or equal to 51 year  old. It is not intended to diagnose infection nor to guide or monitor treatment.     Coagulation Studies: No results for input(s): LABPROT, INR in the last 72 hours.  Urinalysis: No results for input(s): COLORURINE, LABSPEC, PHURINE, GLUCOSEU, HGBUR, BILIRUBINUR, KETONESUR, PROTEINUR, UROBILINOGEN, NITRITE, LEUKOCYTESUR in the last 72 hours.  Invalid input(s): APPERANCEUR    Imaging: No results found.   Medications:   . phenylephrine (NEO-SYNEPHRINE) Adult infusion 200 mcg/min (03/12/17 0945)   . docusate sodium  100 mg Oral BID  . epoetin (EPOGEN/PROCRIT) injection  4,000 Units Intravenous Q M,W,F-HD  . famotidine  20 mg Oral Daily  . feeding supplement (NEPRO CARB STEADY)  237 mL Oral BID BM  . heparin  5,000 Units Subcutaneous Q8H  . hydrocortisone sod succinate (SOLU-CORTEF) inj  50 mg Intravenous Q12H  . insulin aspart  0-9 Units Subcutaneous TID WC  . insulin glargine  10 Units Subcutaneous QHS  . mouth rinse  15 mL Mouth Rinse BID  . megestrol  40 mg Oral Daily  . midodrine  10 mg Oral TID  . multivitamin  1 tablet Oral Daily  . pantoprazole  40 mg Oral Daily   acetaminophen **OR** acetaminophen, alum & mag hydroxide-simeth, fentaNYL (SUBLIMAZE) injection, guaiFENesin-dextromethorphan, HYDROcodone-acetaminophen, ipratropium-albuterol, magnesium sulfate 1 - 4 g bolus IVPB, metoprolol, morphine injection, ondansetron, oxyCODONE-acetaminophen, phenol, polyethylene glycol, promethazine, senna, sorbitol, traMADol  Assessment/ Plan:  83 y.o. female   with end-stage renal disease on hemodialysis, diabetes mellitus type 2, hypertension, obstructive sleep apnea, atrial fibrillation, arthritis, anemia, distal right femoral fracture, admitted on 09/26/2016 for Leg edema [R60.0] Venous thrombosis [I82.90] ,rt leg DVT  MWF [place]  1. End-stage renal disease-  MWF Will arrange for dialysis while in the hospital  2.  Hypotension:  - currently requiring iv  phenylephrine ? Related to pain meds Outpatient SBP 80-110   3. Anemia of chronic kidney disease - Hgb 10.1 - low dose EPO   4. Secondary hyperparathyroidism:   monitor phos  5. LE edema D/c iv fluids UF with dialysis; iv albumin support 760 cc removed 3/30     LOS: 2 Wylie Coon 3/31/201812:05 PM

## 2017-03-12 NOTE — Progress Notes (Signed)
OT Cancellation Note  Patient Details Name: [name] MRN: [id number] DOB: [birth year]   Cancelled Treatment:    Reason Eval/Treat Not Completed: Patient not medically ready. Chart reviewed, pt still not medically ready due to blood pressure issues/medication issues. Will continue to monitor and re-attempt OT evaluation at later date/time as appropriate.   Jeni Salles, MPH, MS, OTR/L ascom [phone] 03/12/17, 11:25 AM

## 2017-03-12 NOTE — Progress Notes (Signed)
2 Days Post-Op  Subjective: Noted to be hypotensive.Now on Levo. Intermittently sleepy but arousable and appropriate. Notes mild pain in stump but complains of mild abdominal pain which shew states is chronic.  Objective: Vital signs in last 24 hours: Temp:  [97.2 F (36.2 C)-99.7 F (37.6 C)] 97.2 F (36.2 C) (03/31 0830) Pulse Rate:  [90-110] 107 (03/31 0600) Resp:  [10-35] 13 (03/31 0600) BP: (51-94)/(34-82) 90/62 (03/31 0600) SpO2:  [90 %-100 %] 94 % (03/31 0600) Weight:  [97 kg (213 lb 13.5 oz)-98.2 kg (216 lb 7.9 oz)] 97 kg (213 lb 13.5 oz) (03/30 1345)    Intake/Output from previous day: 03/30 0701 - 03/31 0700 In: 1580.6 [I.V.:1580.6] Out: 760  Intake/Output this shift: Total I/O In: 67.5 [I.V.:67.5] Out: -   General appearance: cooperative, fatigued and no distress Cardio: regular rate and rhythm, S1, S2 normal, no murmur, click, rub or gallop GI: soft, non-tender; bowel sounds normal; no masses,  no organomegaly Extremities: edema Right leg , dry ulcer over dorsum and medial posterior ankle. no erythema, no drainage and dry eschar Incision/Wound: Left stump dressing- C/D/I Fistula- + thrill/bruit  Lab Results:   Recent Labs  03/11/17 0613 03/12/17 0628  WBC 13.7* 13.3*  HGB 11.5* 10.1*  HCT 35.1 30.8*  PLT 192 219   BMET  Recent Labs  03/11/17 0338 03/12/17 0502  NA 131* 136  K 4.2 4.6  CL 95* 100*  CO2 24 24  GLUCOSE 148* 111*  BUN 15 12  CREATININE 2.90* 2.24*  CALCIUM 8.5* 8.6*   PT/INR No results for input(s): LABPROT, INR in the last 72 hours. ABG No results for input(s): PHART, HCO3 in the last 72 hours.  Invalid input(s): PCO2, PO2  Studies/Results: No results found.  Anti-infectives: Anti-infectives    Start     Dose/Rate Route Frequency Ordered Stop   03/10/17 2000  clindamycin (CLEOCIN) IVPB 300 mg     300 mg 100 mL/hr over 30 Minutes Intravenous Every 8 hours 03/10/17 1648 03/11/17 0921   03/10/17 0130  clindamycin  (CLEOCIN) IVPB 300 mg     300 mg 100 mL/hr over 30 Minutes Intravenous  Once 03/10/17 0126 03/10/17 1240   03/10/17 0130  vancomycin (VANCOCIN) IVPB 1000 mg/200 mL premix     1,000 mg 200 mL/hr over 60 Minutes Intravenous  Once 03/10/17 0126 03/10/17 1150      Assessment/Plan: s/p Procedure(s): AMPUTATION BELOW KNEE (Left) Will obtain EKG, Troponins- patient with tachycardia, hypotension, no evidence of bleeding. But also on HD.  Appreciate Intensivist input.  LOS: 2 days    Evaristo Bury 03/12/2017

## 2017-03-12 NOTE — Progress Notes (Addendum)
[place] [facility] [place] NAME: [name]    MR#:  [id number]  DATE OF BIRTH:  20-Feb-1934  SUBJECTIVE:  patient confused this am since yesterday late afternoon  REVIEW OF SYSTEMS:    ROS  Confused this am cannot obtain ROS   Tolerating Diet:yes      DRUG ALLERGIES:   Allergies  Allergen Reactions  . Angiotensin Receptor Blockers Other (See Comments)    Reaction:  Hyperkalemia  . Penicillins Rash and Other (See Comments)    rash    VITALS:  Blood pressure 90/62, pulse (!) 107, temperature 98.2 F (36.8 C), temperature source Oral, resp. rate 13, height 5\' 1"  (1.549 m), weight 97 kg (213 lb 13.5 oz), SpO2 94 %.  PHYSICAL EXAMINATION:   Physical Exam  Constitutional: [name], well-nourished, and in no distress. No distress.  HENT:  Head: Normocephalic.  Eyes: No scleral icterus.  Neck: Normal range of motion. Neck supple. No JVD present. No tracheal deviation present.  Cardiovascular: Normal rate and normal heart sounds.  Exam reveals no gallop and no friction rub.   No murmur heard. tachy  Pulmonary/Chest: Effort normal and breath sounds normal. No respiratory distress. She has no wheezes. She has no rales. She exhibits no tenderness.  Abdominal: Soft. Bowel sounds are normal. She exhibits no distension and no mass. There is no tenderness. There is no rebound and no guarding.  Musculoskeletal: She exhibits edema.  Left BKA  Neurological: She is alert.  Confused this am  Skin: Skin is warm. No rash noted. She is not diaphoretic. No erythema.      LABORATORY PANEL:   CBC  Recent Labs Lab 03/12/17 0628  WBC 13.3*  HGB 10.1*  HCT 30.8*  PLT 219   ------------------------------------------------------------------------------------------------------------------  Chemistries   Recent Labs Lab 03/12/17 0502  NA 136  K 4.6  CL 100*  CO2 24  GLUCOSE 111*  BUN 12  CREATININE 2.24*  CALCIUM 8.6*    ------------------------------------------------------------------------------------------------------------------  Cardiac Enzymes No results for input(s): TROPONINI in the last 168 hours. ------------------------------------------------------------------------------------------------------------------  RADIOLOGY:  No results found.   ASSESSMENT AND PLAN:   83 year old female with ESRD postop left below-knee amputation for left foot ulceration/osteomyelitis.   1. Acute encephalopathy due to ICU delirium and post op delirium/hypotension related? Try to orient patient daily Keep awake during day and rest at night Consider CT Head if no improvement through day Check CXR   2. Postoperative day #2 left below the knee amputation: Management as per surgery  3 Postop hypotension on baseline chronic hypotension: Patient back on pressors Continue Midodrine Hold on HTN medications  4 ESRD on hemodialysis  5. A. fib with RVR: Due to low BP BB stopped. If she has persistent tachycardia will consider Amio or Dig  6. Diabetes: Continue Lantus with ADA diet and sliding scale       CODE STATUS: DNR  crititcal careTOTAL TIME TAKING CARE OF THIS PATIENT: 30 minutes.   Still on pressors with new onset delirium  POSSIBLE D/C 3-4 days, DEPENDING ON CLINICAL CONDITION.   Domnique Vantine M.D on 03/12/2017 at 7:13 AM  Between 7am to 6pm - Pager - [phone] After 6pm go to [email or web address] - password [email or web address] [facility]  Office  [phone]  CC: Primary care physician; Rusty Aus, MD  Note: This dictation was prepared with Dragon dictation along with smaller phrase technology. Any transcriptional errors that result from this process are unintentional.

## 2017-03-12 NOTE — Progress Notes (Addendum)
PT Cancellation Note  Patient Details Name: [name] MRN: [id number] DOB: 02-Apr-1934   Cancelled Treatment:    Reason Eval/Treat Not Completed: Patient not medically ready. Spoke with nursing; pt not medically ready due to blood pressure issues/medication issues. Pt obtunded at this time. Now with discontinuation orders for PT.    Larae Grooms, PTA 03/12/2017, 11:12 AM

## 2017-03-12 NOTE — Progress Notes (Signed)
Dr Alva Garnet aware of low bp on 2105mcg neosynephrine. Neo gtt titrated to 283mcg. Pt asleep but arouses easily. Good bruit to fistula LFA.

## 2017-03-12 NOTE — Progress Notes (Signed)
Pt alert with much confusion throughout this shift.  Patient self-removed C-PAP multiple times this night, removed her O2 sensor. Reoriented patient every time in the room. No urinary output this shift at this time. IV x2 still patent. Neo continues to infuse being titered for MAP 55 and SBP 80. Currently Neo is at 22mcg. Husband came in earlier in the shift to visit. Patient has denied pain throughout the entire shift. Continues to receive the routine doses of IV Toradol. Safety maintained. Will continue to monitor. Patient currently awake, removed C-PAP and is on 2 L O2 via [place].

## 2017-03-13 ENCOUNTER — Inpatient Hospital Stay: Payer: Medicare Other

## 2017-03-13 DIAGNOSIS — Z992 Dependence on renal dialysis: Secondary | ICD-10-CM

## 2017-03-13 DIAGNOSIS — J90 Pleural effusion, not elsewhere classified: Secondary | ICD-10-CM

## 2017-03-13 DIAGNOSIS — R6521 Severe sepsis with septic shock: Secondary | ICD-10-CM

## 2017-03-13 DIAGNOSIS — A419 Sepsis, unspecified organism: Secondary | ICD-10-CM

## 2017-03-13 DIAGNOSIS — M86172 Other acute osteomyelitis, left ankle and foot: Secondary | ICD-10-CM

## 2017-03-13 DIAGNOSIS — N186 End stage renal disease: Secondary | ICD-10-CM

## 2017-03-13 LAB — BASIC METABOLIC PANEL
ANION GAP: 9 (ref 5–15)
BUN: 17 mg/dL (ref 6–20)
CO2: 25 mmol/L (ref 22–32)
Calcium: 8.4 mg/dL — ABNORMAL LOW (ref 8.9–10.3)
Chloride: 99 mmol/L — ABNORMAL LOW (ref 101–111)
Creatinine, Ser: 2.56 mg/dL — ABNORMAL HIGH (ref 0.44–1.00)
GFR calc Af Amer: 19 mL/min — ABNORMAL LOW (ref >60)
GFR, EST NON AFRICAN AMERICAN: 16 mL/min — AB (ref >60)
Glucose, Bld: 143 mg/dL — ABNORMAL HIGH (ref 65–99)
POTASSIUM: 3.8 mmol/L (ref 3.5–5.1)
Sodium: 133 mmol/L — ABNORMAL LOW (ref 135–145)

## 2017-03-13 LAB — PROCALCITONIN: PROCALCITONIN: 0.62 ng/mL

## 2017-03-13 LAB — CBC
HCT: 28.5 % — ABNORMAL LOW (ref 35.0–47.0)
HEMOGLOBIN: 9.3 g/dL — AB (ref 12.0–16.0)
MCH: 33.2 pg (ref 26.0–34.0)
MCHC: 32.7 g/dL (ref 32.0–36.0)
MCV: 101.7 fL — AB (ref 80.0–100.0)
PLATELETS: 226 10*3/uL (ref 150–440)
RBC: 2.8 MIL/uL — AB (ref 3.80–5.20)
RDW: 17.3 % — ABNORMAL HIGH (ref 11.5–14.5)
WBC: 10.1 10*3/uL (ref 3.6–11.0)

## 2017-03-13 LAB — GLUCOSE, CAPILLARY
GLUCOSE-CAPILLARY: 126 mg/dL — AB (ref 65–99)
Glucose-Capillary: 123 mg/dL — ABNORMAL HIGH (ref 65–99)
Glucose-Capillary: 117 mg/dL — ABNORMAL HIGH (ref 65–99)
Glucose-Capillary: 125 mg/dL — ABNORMAL HIGH (ref 65–99)

## 2017-03-13 LAB — PHOSPHORUS: Phosphorus: 3.2 mg/dL (ref 2.5–4.6)

## 2017-03-13 MED ORDER — VANCOMYCIN HCL 10 G IV SOLR
1500.0000 mg | Freq: Once | INTRAVENOUS | Status: AC
  Administered 2017-03-13: 1500 mg via INTRAVENOUS
  Filled 2017-03-13: qty 1500

## 2017-03-13 MED ORDER — VANCOMYCIN HCL 500 MG IV SOLR
500.0000 mg | INTRAVENOUS | Status: DC
  Administered 2017-03-14: 500 mg via INTRAVENOUS
  Filled 2017-03-13 (×2): qty 500

## 2017-03-13 MED ORDER — DEXTROSE 5 % IV SOLN
2.0000 g | INTRAVENOUS | Status: DC
  Administered 2017-03-13 – 2017-03-14 (×2): 2 g via INTRAVENOUS
  Filled 2017-03-13 (×2): qty 2

## 2017-03-13 MED ORDER — ALBUMIN HUMAN 25 % IV SOLN
12.5000 g | Freq: Once | INTRAVENOUS | Status: DC
  Filled 2017-03-13: qty 50

## 2017-03-13 NOTE — Progress Notes (Signed)
Subjective:   Patient underwent Left BKA for non healing ulcer Currently being monitored in the ICU for post Op Hypotension requiring iv pressors Reports no complaints Oral intake is poor Rt IJ CVL placed last night   Objective:  Vital signs in last 24 hours:  Temp:  [97.6 F (36.4 C)-99.2 F (37.3 C)] 98.1 F (36.7 C) (04/01 0400) Pulse Rate:  [99-130] 115 (04/01 0630) Resp:  [0-22] 18 (04/01 0630) BP: (52-105)/(28-82) 85/48 (04/01 0630) SpO2:  [80 %-100 %] 89 % (04/01 0630)  Weight change:  Filed Weights   03/10/17 1653 03/11/17 1015 03/11/17 1345  Weight: 95.5 kg (210 lb 9.6 oz) 98.2 kg (216 lb 7.9 oz) 97 kg (213 lb 13.5 oz)    Intake/Output:    Intake/Output Summary (Last 24 hours) at 03/13/17 0855 Last data filed at 03/13/17 0600  Gross per 24 hour  Intake          2571.83 ml  Output                0 ml  Net          2571.83 ml     Physical Exam: General: Laying in bed, MAD  HEENT Moist oral mucus membranes,    Neck supple  Pulm/lungs Normal effort, clear to auscultation  CVS/Heart Irregular, A Fib  Abdomen:  Soft, NT  Extremities: Left BKA, Rt leg in bandage,  + edema over thigh  Neurologic: A & O  Access: Left forearm AVF          Basic Metabolic Panel:   Recent Labs Lab 03/10/17 1052 03/10/17 1716 03/11/17 0338 03/12/17 0502 03/13/17 0505  NA 132*  --  131* 136 133*  K 3.2*  --  4.2 4.6 3.8  CL  --   --  95* 100* 99*  CO2  --   --  24 24 25   GLUCOSE 133*  --  148* 111* 143*  BUN  --   --  15 12 17   CREATININE  --  2.56* 2.90* 2.24* 2.56*  CALCIUM  --   --  8.5* 8.6* 8.4*     CBC:  Recent Labs Lab 03/10/17 1052 03/10/17 1716 03/11/17 0613 03/12/17 0628 03/13/17 0505  WBC  --  14.3* 13.7* 13.3* 10.1  HGB 12.6 10.9* 11.5* 10.1* 9.3*  HCT 37.0 32.9* 35.1 30.8* 28.5*  MCV  --  99.5 102.2* 101.5* 101.7*  PLT  --  184 192 219 226      Microbiology:  Recent Results (from the past 720 hour(s))  Aerobic Culture  (superficial specimen)     Status: None   Collection Time: 02/22/17  3:50 PM  Result Value Ref Range Status   Specimen Description FOOT LEFT  Final   Special Requests CALCANEUS  Final   Gram Stain   Final    RARE WBC PRESENT, PREDOMINANTLY PMN ABUNDANT GRAM POSITIVE COCCI IN PAIRS ABUNDANT GRAM NEGATIVE COCCOBACILLI FEW YEAST Performed at [facility], [address] [place],  [address]    Culture   Final    ABUNDANT KLEBSIELLA PNEUMONIAE ABUNDANT ENTEROCOCCUS FAECALIS    Report Status 02/27/2017 FINAL  Final   Organism ID, Bacteria KLEBSIELLA PNEUMONIAE  Final   Organism ID, Bacteria ENTEROCOCCUS FAECALIS  Final      Susceptibility   Enterococcus faecalis - MIC*    AMPICILLIN <=2 SENSITIVE Sensitive     VANCOMYCIN 1 SENSITIVE Sensitive     GENTAMICIN SYNERGY RESISTANT Resistant     *  ABUNDANT ENTEROCOCCUS FAECALIS   Klebsiella pneumoniae - MIC*    AMPICILLIN >=32 RESISTANT Resistant     CEFAZOLIN <=4 SENSITIVE Sensitive     CEFEPIME <=1 SENSITIVE Sensitive     CEFTAZIDIME <=1 SENSITIVE Sensitive     CEFTRIAXONE <=1 SENSITIVE Sensitive     CIPROFLOXACIN <=0.25 SENSITIVE Sensitive     GENTAMICIN <=1 SENSITIVE Sensitive     IMIPENEM <=0.25 SENSITIVE Sensitive     TRIMETH/SULFA <=20 SENSITIVE Sensitive     AMPICILLIN/SULBACTAM 4 SENSITIVE Sensitive     PIP/TAZO <=4 SENSITIVE Sensitive     Extended ESBL NEGATIVE Sensitive     * ABUNDANT KLEBSIELLA PNEUMONIAE  Surgical pcr screen     Status: Abnormal   Collection Time: 03/03/17  8:45 AM  Result Value Ref Range Status   MRSA, PCR POSITIVE (A) NEGATIVE Final    Comment: RESULT CALLED TO, READ BACK BY AND VERIFIED WITH: GAIL GRAVES 03/03/17 1140 SGD    Staphylococcus aureus POSITIVE (A) NEGATIVE Final    Comment:        The Xpert SA Assay (FDA approved for NASAL specimens in patients over 2 years of age), is one component of a comprehensive surveillance program.  Test performance has been validated by  [facility] for patients greater than or equal to 81 year old. It is not intended to diagnose infection nor to guide or monitor treatment.     Coagulation Studies: No results for input(s): LABPROT, INR in the last 72 hours.  Urinalysis: No results for input(s): COLORURINE, LABSPEC, PHURINE, GLUCOSEU, HGBUR, BILIRUBINUR, KETONESUR, PROTEINUR, UROBILINOGEN, NITRITE, LEUKOCYTESUR in the last 72 hours.  Invalid input(s): APPERANCEUR    Imaging: Dg Chest Port 1 View  Result Date: 03/13/2017 CLINICAL DATA:  Cough.  Central line placement. EXAM: PORTABLE CHEST 1 VIEW COMPARISON:  Radiographs yesterday at 2031 hour FINDINGS: Right internal jugular central venous catheter tip is in the region of the mid SVC. No pneumothorax. Cardiomegaly is stable. Left pleural effusion and left lung opacities are stable from prior exam. Right infrahilar atelectasis. IMPRESSION: Tip of the right internal jugular central venous catheter in the SVC. No pneumothorax. Unchanged left pleural effusion and left lung opacities. Electronically Signed   By: Jeb Levering M.D.   On: 03/13/2017 01:42   Dg Chest Port 1 View  Result Date: 03/12/2017 CLINICAL DATA:  Dyspnea EXAM: PORTABLE CHEST 1 VIEW COMPARISON:  03/03/2017 chest radiograph. FINDINGS: Low lung volumes. Stable cardiomediastinal silhouette with cardiomegaly and aortic atherosclerosis. No pneumothorax. No right pleural effusion. Small left pleural effusion, increased. Mild pulmonary edema. Bibasilar lung opacities, increased on the left. IMPRESSION: 1. Mild congestive heart failure, new. 2. Small left pleural effusion, increased . 3. Bibasilar lung opacities, increased on the left, favor atelectasis. 4. Aortic atherosclerosis. Electronically Signed   By: Ilona Sorrel M.D.   On: 03/12/2017 23:57     Medications:   . phenylephrine (NEO-SYNEPHRINE) Adult infusion 150 mcg/min (03/13/17 0617)   . ceFEPime (MAXIPIME) IV  2 g Intravenous Q24H  . docusate sodium   100 mg Oral BID  . epoetin (EPOGEN/PROCRIT) injection  4,000 Units Intravenous Q M,W,F-HD  . famotidine  20 mg Oral Daily  . feeding supplement (NEPRO CARB STEADY)  237 mL Oral BID BM  . heparin  5,000 Units Subcutaneous Q8H  . hydrocortisone sod succinate (SOLU-CORTEF) inj  50 mg Intravenous Q12H  . insulin aspart  0-9 Units Subcutaneous TID WC  . insulin glargine  10 Units Subcutaneous QHS  . mouth rinse  15 mL Mouth Rinse BID  . megestrol  40 mg Oral Daily  . midodrine  10 mg Oral TID  . multivitamin  1 tablet Oral Daily  . pantoprazole  40 mg Oral Daily  . [START ON 03/14/2017] vancomycin  500 mg Intravenous Q24H   acetaminophen **OR** acetaminophen, alum & mag hydroxide-simeth, fentaNYL (SUBLIMAZE) injection, guaiFENesin-dextromethorphan, HYDROcodone-acetaminophen, ipratropium-albuterol, magnesium sulfate 1 - 4 g bolus IVPB, metoprolol, morphine injection, ondansetron, oxyCODONE-acetaminophen, phenol, polyethylene glycol, promethazine, senna, sorbitol, traMADol  Assessment/ Plan:  83 y.o. female   with end-stage renal disease on hemodialysis, diabetes mellitus type 2, hypertension, obstructive sleep apnea, atrial fibrillation, arthritis, anemia, distal right femoral fracture, admitted on 09/26/2016 for Leg edema [R60.0] Venous thrombosis [I82.90] ,rt leg DVT  MWF [facility]  1. End-stage renal disease-  MWF Will arrange for dialysis while in the hospital  2.  Hypotension:  - currently requiring iv phenylephrine ? Related to pain meds Outpatient SBP 80-110   3. Anemia of chronic kidney disease - Hgb 9.3 - low dose EPO   4. Secondary hyperparathyroidism:   monitor phos  5. LE edema D/c iv fluids; getting almost 2000 cc with iv phenylephrine. 4x concentrated today UF with dialysis; iv albumin support 760 cc removed 3/30     LOS: 3 Denai Caba 4/1/20188:55 AM

## 2017-03-13 NOTE — Progress Notes (Signed)
In to give patient medications.  NAD noted. Pt resting comfortably.

## 2017-03-13 NOTE — Progress Notes (Signed)
[place] [facility] [place] NAME: [name]    MR#:  [id number]  DATE OF BIRTH:  [birth year]  SUBJECTIVE:  Patient remains with short-term confusion.  Husband at bedside REVIEW OF SYSTEMS:    ROS  Confused this am cannot obtain ROS   Tolerating Diet:yes      DRUG ALLERGIES:   Allergies  Allergen Reactions  . Angiotensin Receptor Blockers Other (See Comments)    Reaction:  Hyperkalemia  . Penicillins Rash and Other (See Comments)    rash    VITALS:  Blood pressure (!) 85/48, pulse (!) 115, temperature 98.1 F (36.7 C), temperature source Oral, resp. rate 18, height 5\' 1"  (1.549 m), weight 97 kg (213 lb 13.5 oz), SpO2 (!) 89 %.  PHYSICAL EXAMINATION:   Physical Exam  Constitutional: [name], well-nourished, and in no distress. No distress.  HENT:  Head: Normocephalic.  Eyes: No scleral icterus.  Neck: Normal range of motion. Neck supple. No JVD present. No tracheal deviation present.  Cardiovascular: Normal rate and normal heart sounds.  Exam reveals no gallop and no friction rub.   No murmur heard. tachy  Pulmonary/Chest: Effort normal and breath sounds normal. No respiratory distress. She has no wheezes. She has no rales. She exhibits no tenderness.  Abdominal: Soft. Bowel sounds are normal. She exhibits no distension and no mass. There is no tenderness. There is no rebound and no guarding.  Musculoskeletal: She exhibits edema.  Left BKA  Neurological: She is alert.  Confused this am  Skin: Skin is warm. No rash noted. She is not diaphoretic. No erythema.      LABORATORY PANEL:   CBC  Recent Labs Lab 03/13/17 0505  WBC 10.1  HGB 9.3*  HCT 28.5*  PLT 226   ------------------------------------------------------------------------------------------------------------------  Chemistries   Recent Labs Lab 03/13/17 0505  NA 133*  K 3.8  CL 99*  CO2 25  GLUCOSE 143*  BUN 17  CREATININE 2.56*   CALCIUM 8.4*   ------------------------------------------------------------------------------------------------------------------  Cardiac Enzymes  Recent Labs Lab 03/12/17 0502  TROPONINI 2.23*   ------------------------------------------------------------------------------------------------------------------  RADIOLOGY:  Dg Chest Port 1 View  Result Date: 03/13/2017 CLINICAL DATA:  Cough.  Central line placement. EXAM: PORTABLE CHEST 1 VIEW COMPARISON:  Radiographs yesterday at 2031 hour FINDINGS: Right internal jugular central venous catheter tip is in the region of the mid SVC. No pneumothorax. Cardiomegaly is stable. Left pleural effusion and left lung opacities are stable from prior exam. Right infrahilar atelectasis. IMPRESSION: Tip of the right internal jugular central venous catheter in the SVC. No pneumothorax. Unchanged left pleural effusion and left lung opacities. Electronically Signed   By: [name] M.D.   On: 03/13/2017 01:42   Dg Chest Port 1 View  Result Date: 03/12/2017 CLINICAL DATA:  Dyspnea EXAM: PORTABLE CHEST 1 VIEW COMPARISON:  03/03/2017 chest radiograph. FINDINGS: Low lung volumes. Stable cardiomediastinal silhouette with cardiomegaly and aortic atherosclerosis. No pneumothorax. No right pleural effusion. Small left pleural effusion, increased. Mild pulmonary edema. Bibasilar lung opacities, increased on the left. IMPRESSION: 1. Mild congestive heart failure, new. 2. Small left pleural effusion, increased . 3. Bibasilar lung opacities, increased on the left, favor atelectasis. 4. Aortic atherosclerosis. Electronically Signed   By: [name] M.D.   On: 03/12/2017 23:57     ASSESSMENT AND PLAN:   83 year old female with ESRD postop left below-knee amputation for left foot ulceration/osteomyelitis.   1. Acute encephalopathy due to ICU delirium and post op  delirium/hypotension related? Continue to Try to orient patient daily Keep awake during day and  rest at night Consider CT Head in a.m. if no improvement    2. Postoperative day #3 left below the knee amputation: Management as per surgery  3 Postop hypotension on baseline chronic hypotension: Patient remains on pressors Continue Midodrine Hold on HTN medications  4 ESRD on hemodialysis as per nephrology  5. A. fib with RVR: Due to low BP BB stopped. If she has persistent rapid rate will consider Amio or Dig  6. Diabetes: Continue Lantus with ADA diet and sliding scale       CODE STATUS: DNR  crititcal care TOTAL TIME TAKING CARE OF THIS PATIENT: 24 minutes.   Still on pressors with  delirium  POSSIBLE D/C 3-4 days, DEPENDING ON CLINICAL CONDITION.   [name] M.D on 03/13/2017 at 9:37 AM  Between 7am to 6pm - Pager - [phone] After 6pm go to [email or web address] - password [email or web address] [facility]  Office  [phone]  CC: Primary care physician; Connie Aus, MD  Note: This dictation was prepared with Dragon dictation along with smaller phrase technology. Any transcriptional errors that result from this process are unintentional.

## 2017-03-13 NOTE — Progress Notes (Signed)
3 Days Post-Op  Subjective: Remains on pressors Confusion persists  Objective: Vital signs in last 24 hours: Temp:  [97.6 F (36.4 C)-99.2 F (37.3 C)] 98.1 F (36.7 C) (04/01 0400) Pulse Rate:  [107-130] 112 (04/01 1100) Resp:  [0-22] 20 (04/01 1100) BP: (52-105)/(30-82) 87/50 (04/01 1100) SpO2:  [80 %-100 %] 93 % (04/01 1100)    Intake/Output from previous day: 03/31 0701 - 04/01 0700 In: 2639.3 [P.O.:120; I.V.:1969.3; IV Piggyback:550] Out: -  Intake/Output this shift: No intake/output data recorded.  General appearance: no distress and confused Extremities: left BKA stump- dressing C/D/I  Lab Results:   Recent Labs  03/12/17 0628 03/13/17 0505  WBC 13.3* 10.1  HGB 10.1* 9.3*  HCT 30.8* 28.5*  PLT 219 226   BMET  Recent Labs  03/12/17 0502 03/13/17 0505  NA 136 133*  K 4.6 3.8  CL 100* 99*  CO2 24 25  GLUCOSE 111* 143*  BUN 12 17  CREATININE 2.24* 2.56*  CALCIUM 8.6* 8.4*   PT/INR No results for input(s): LABPROT, INR in the last 72 hours. ABG No results for input(s): PHART, HCO3 in the last 72 hours.  Invalid input(s): PCO2, PO2  Studies/Results: Dg Chest Port 1 View  Result Date: 03/13/2017 CLINICAL DATA:  Cough.  Central line placement. EXAM: PORTABLE CHEST 1 VIEW COMPARISON:  Radiographs yesterday at 2031 hour FINDINGS: Right internal jugular central venous catheter tip is in the region of the mid SVC. No pneumothorax. Cardiomegaly is stable. Left pleural effusion and left lung opacities are stable from prior exam. Right infrahilar atelectasis. IMPRESSION: Tip of the right internal jugular central venous catheter in the SVC. No pneumothorax. Unchanged left pleural effusion and left lung opacities. Electronically Signed   By: Jeb Levering M.D.   On: 03/13/2017 01:42   Dg Chest Port 1 View  Result Date: 03/12/2017 CLINICAL DATA:  Dyspnea EXAM: PORTABLE CHEST 1 VIEW COMPARISON:  03/03/2017 chest radiograph. FINDINGS: Low lung volumes. Stable  cardiomediastinal silhouette with cardiomegaly and aortic atherosclerosis. No pneumothorax. No right pleural effusion. Small left pleural effusion, increased. Mild pulmonary edema. Bibasilar lung opacities, increased on the left. IMPRESSION: 1. Mild congestive heart failure, new. 2. Small left pleural effusion, increased . 3. Bibasilar lung opacities, increased on the left, favor atelectasis. 4. Aortic atherosclerosis. Electronically Signed   By: Ilona Sorrel M.D.   On: 03/12/2017 23:57    Anti-infectives: Anti-infectives    Start     Dose/Rate Route Frequency Ordered Stop   03/14/17 0300  vancomycin (VANCOCIN) 500 mg in sodium chloride 0.9 % 100 mL IVPB     500 mg 100 mL/hr over 60 Minutes Intravenous Every 24 hours 03/13/17 0327     03/13/17 0315  vancomycin (VANCOCIN) 1,500 mg in sodium chloride 0.9 % 500 mL IVPB     1,500 mg 250 mL/hr over 120 Minutes Intravenous  Once 03/13/17 0300 03/13/17 0607   03/13/17 0315  ceFEPIme (MAXIPIME) 2 g in dextrose 5 % 50 mL IVPB     2 g 100 mL/hr over 30 Minutes Intravenous Every 24 hours 03/13/17 0300     03/10/17 2000  clindamycin (CLEOCIN) IVPB 300 mg     300 mg 100 mL/hr over 30 Minutes Intravenous Every 8 hours 03/10/17 1648 03/11/17 0921   03/10/17 0130  clindamycin (CLEOCIN) IVPB 300 mg     300 mg 100 mL/hr over 30 Minutes Intravenous  Once 03/10/17 0126 03/10/17 1240   03/10/17 0130  vancomycin (VANCOCIN) IVPB 1000 mg/200 mL premix  1,000 mg 200 mL/hr over 60 Minutes Intravenous  Once 03/10/17 0126 03/10/17 1150      Assessment/Plan: s/p Procedure(s): AMPUTATION BELOW KNEE (Left) Appreciate consultant input  Recommend- Amiodarone or Digoxin for HR control HD per nephrology Monitor confusion Will change dressing tomorrow.   LOS: 3 days    Jamesetta So A 03/13/2017

## 2017-03-13 NOTE — Progress Notes (Signed)
PT Cancellation Note  Patient Details Name: [name] MRN: [id number] DOB: [birth year]   Cancelled Treatment:    Reason Eval/Treat Not Completed: Patient not medically ready  Spoke with pt's nurse, reports she is obtunded, on pressers secondary to very low BP and getting HD today.  Suggested holding PT today.  Kreg Shropshire, DPT 03/13/2017, 12:05 PM

## 2017-03-13 NOTE — Progress Notes (Signed)
Remains lethargic but in no overt distress. Remains on phenylephrine. Antibiotics initiated this early AM. Oriented to person and place    Vitals:   03/13/17 0930 03/13/17 1000 03/13/17 1030 03/13/17 1100  BP: (!) 85/49 (!) 88/60 (!) 73/46 (!) 87/50  Pulse: (!) 117 (!) 115 (!) 121 (!) 112  Resp: 19 (!) 22 20 20   Temp:      TempSrc:      SpO2: 95% (!) 89% 94% 93%  Weight:      Height:       Chronically ill appearing, RASS 0 HEENT WNL JVP Not well visualized Chest clear anteriorly IRIR, soft holosystolic murmur radiating to apex Obese, soft, diminished bowel sounds Status post left lower extremity BKA, moderate pretibial edema on the right No focal neurologic deficits  BMP Latest Ref Rng & Units 03/13/2017 03/12/2017 03/11/2017  Glucose 65 - 99 mg/dL 143(H) 111(H) 148(H)  BUN 6 - 20 mg/dL 17 12 15   Creatinine 0.44 - 1.00 mg/dL 2.56(H) 2.24(H) 2.90(H)  Sodium 135 - 145 mmol/L 133(L) 136 131(L)  Potassium 3.5 - 5.1 mmol/L 3.8 4.6 4.2  Chloride 101 - 111 mmol/L 99(L) 100(L) 95(L)  CO2 22 - 32 mmol/L 25 24 24   Calcium 8.9 - 10.3 mg/dL 8.4(L) 8.6(L) 8.5(L)   CBC Latest Ref Rng & Units 03/13/2017 03/12/2017 03/11/2017  WBC 3.6 - 11.0 K/uL 10.1 13.3(H) 13.7(H)  Hemoglobin 12.0 - 16.0 g/dL 9.3(L) 10.1(L) 11.5(L)  Hematocrit 35.0 - 47.0 % 28.5(L) 30.8(L) 35.1  Platelets 150 - 440 K/uL 226 219 192   CXR: CM, L effusion with possible infiltrate  EKG: no new EKG  IMPRESSION: ESRD Peripheral arterial disease S/P L BKA Chronic hypotension with systolics in the 91-660 range Chronic atrial fibrillation, rate adequately controlled Elevated troponin I Refractory shock DNR/DNI L effusion, possible LLL PNA  PLAN/REC: Continue phenylephrine gtt to maintain MAP > 55 mcg/min Cont current abx Continue midodrine and hydrocortisone Echocardiogram ordered - will be performed 04/02 Recheck trop I in AM 04/02 HD planned today DNR/DNI remains appropriate   Merton Border, MD PCCM  service Mobile [phone] Pager [phone] 03/13/2017

## 2017-03-13 NOTE — Procedures (Signed)
Right Internal Jugular Central Venous Catheter Insertion Procedure Note [name] [id number] 1934-04-27  Procedure: Insertion of Central Venous Catheter Indications: Assessment of intravascular volume, Drug and/or fluid administration and Frequent blood sampling  Procedure Details Consent: Unable to obtain consent because of emergent medical necessity. Time Out: Verified patient identification, verified procedure, site/side was marked, verified correct patient position, special equipment/implants available, medications/allergies/relevent history reviewed, required imaging and test results available.  Performed  Maximum sterile technique was used including antiseptics, cap, gloves, gown, hand hygiene, mask and sheet. Skin prep: Chlorhexidine; local anesthetic administered A antimicrobial bonded/coated triple lumen catheter was placed in the right internal jugular vein using the Seldinger technique.  Evaluation Blood flow good Complications: No apparent complications Patient did tolerate procedure well. Chest X-ray ordered to verify placement.  CXR: normal.  Procedure performed under direct supervision of Dr.Kasa. Ultrasound utilized for realtime vessel cannulation Magdalene S. [facility] ANP-BC Pulmonary and Critical Care Medicine [facility] Pager [phone] or [phone]  03/13/2017, 1:30 AM  Merton Border, MD PCCM service Mobile [phone] Pager [phone] 03/13/2017

## 2017-03-13 NOTE — Progress Notes (Signed)
Pharmacy Antibiotic Note  [name] is a 83 y.o. female admitted on 03/10/2017 with sepsis.  Pharmacy has been consulted for vancomycin dosing.  Plan: Will administer vancomycin 1.5g IV x 1 (15 mg/kg)  Will follow with vanc 500 mg IV q24h. Will check a vanc trough 4/3 @ 0200 prior to the 3rd dose to ensure patient is not accumulating Ke 0.0218 T1/2 31 hours -- for easy dosing will decrease the maintenance dose and give q24h Will dose cefepime 2g q24h   Height: 5\' 1"  (154.9 cm) Weight: 213 lb 13.5 oz (97 kg) IBW/kg (Calculated) : 47.8  Temp (24hrs), Avg:98.2 F (36.8 C), Min:97.2 F (36.2 C), Max:99.2 F (37.3 C)   Recent Labs Lab 03/10/17 1716 03/11/17 0338 03/11/17 0613 03/12/17 0502 03/12/17 0628  WBC 14.3*  --  13.7*  --  13.3*  CREATININE 2.56* 2.90*  --  2.24*  --     Estimated Creatinine Clearance: 21 mL/min (A) (by C-G formula based on SCr of 2.24 mg/dL (H)).    Allergies  Allergen Reactions  . Angiotensin Receptor Blockers Other (See Comments)    Reaction:  Hyperkalemia  . Penicillins Rash and Other (See Comments)    rash    Thank you for allowing pharmacy to be a part of this patient's care.  Tobie Lords, PharmD, BCPS Clinical Pharmacist 03/13/2017

## 2017-03-14 ENCOUNTER — Inpatient Hospital Stay
Admission: RE | Admit: 2017-03-14 | Discharge: 2017-03-14 | Disposition: A | Discharge status: Home / Self Care | Payer: Medicare Other | Source: Ambulatory Visit | Attending: Pulmonary Disease | Admitting: Pulmonary Disease

## 2017-03-14 LAB — GLUCOSE, CAPILLARY
Glucose-Capillary: 113 mg/dL — ABNORMAL HIGH (ref 65–99)
Glucose-Capillary: 144 mg/dL — ABNORMAL HIGH (ref 65–99)
Glucose-Capillary: 131 mg/dL — ABNORMAL HIGH (ref 65–99)
Glucose-Capillary: 106 mg/dL — ABNORMAL HIGH (ref 65–99)

## 2017-03-14 LAB — ECHOCARDIOGRAM COMPLETE
Height: 61 in
Weight: 3421.54 oz

## 2017-03-14 LAB — BASIC METABOLIC PANEL
Anion gap: 9 (ref 5–15)
BUN: 23 mg/dL — AB (ref 6–20)
CHLORIDE: 101 mmol/L (ref 101–111)
CO2: 24 mmol/L (ref 22–32)
CREATININE: 2.96 mg/dL — AB (ref 0.44–1.00)
Calcium: 8.6 mg/dL — ABNORMAL LOW (ref 8.9–10.3)
GFR calc Af Amer: 16 mL/min — ABNORMAL LOW (ref >60)
GFR calc non Af Amer: 14 mL/min — ABNORMAL LOW (ref >60)
GLUCOSE: 124 mg/dL — AB (ref 65–99)
Potassium: 3.9 mmol/L (ref 3.5–5.1)
Sodium: 134 mmol/L — ABNORMAL LOW (ref 135–145)

## 2017-03-14 LAB — TROPONIN I: TROPONIN I: 0.03 ng/mL — AB (ref <0.03)

## 2017-03-14 LAB — SURGICAL PATHOLOGY

## 2017-03-14 MED ORDER — CEFEPIME-DEXTROSE 1 GM/50ML IV SOLR
1.0000 g | INTRAVENOUS | Status: AC
  Administered 2017-03-15 – 2017-03-18 (×4): 1 g via INTRAVENOUS
  Filled 2017-03-14 (×4): qty 50

## 2017-03-14 MED ORDER — SODIUM CHLORIDE 0.9 % IV SOLN
1250.0000 mg | Freq: Once | INTRAVENOUS | Status: DC
  Filled 2017-03-14: qty 1250

## 2017-03-14 NOTE — Progress Notes (Signed)
[name] at [place] NAME: [name]    MR#:  [id number]  DATE OF BIRTH:  [birth year]  SUBJECTIVE:  Patient Still was some confusion this morning.  Husband is at bedside. REVIEW OF SYSTEMS:    ROS  Confused this am cannot obtain ROS   Tolerating Diet:yes      DRUG ALLERGIES:   Allergies  Allergen Reactions  . Angiotensin Receptor Blockers Other (See Comments)    Reaction:  Hyperkalemia  . Penicillins Rash and Other (See Comments)    rash    VITALS:  Blood pressure (!) 92/50, pulse (!) 126, temperature 97.3 F (36.3 C), temperature source Oral, resp. rate 17, height 5\' 1"  (1.549 m), weight 97 kg (213 lb 13.5 oz), SpO2 100 %.  PHYSICAL EXAMINATION:   Physical Exam  Constitutional: [name], well-nourished, and in no distress. No distress.  HENT:  Head: Normocephalic.  Eyes: No scleral icterus.  Neck: Normal range of motion. Neck supple. No JVD present. No tracheal deviation present.  Cardiovascular: Normal rate and normal heart sounds.  Exam reveals no gallop and no friction rub.   No murmur heard. irr, irr  Pulmonary/Chest: Effort normal and breath sounds normal. No respiratory distress. She has no wheezes. She has no rales. She exhibits no tenderness.  Abdominal: Soft. Bowel sounds are normal. She exhibits no distension and no mass. There is no tenderness. There is no rebound and no guarding.  Musculoskeletal: She exhibits edema.  Left BKA  Neurological: She is alert.  Confused this am  Skin: Skin is warm. No rash noted. She is not diaphoretic. No erythema.      LABORATORY PANEL:   CBC  Recent Labs Lab 03/13/17 0505  WBC 10.1  HGB 9.3*  HCT 28.5*  PLT 226   ------------------------------------------------------------------------------------------------------------------  Chemistries   Recent Labs Lab 03/14/17 0442  NA 134*  K 3.9  CL 101  CO2 24  GLUCOSE 124*  BUN 23*  CREATININE  2.96*  CALCIUM 8.6*   ------------------------------------------------------------------------------------------------------------------  Cardiac Enzymes  Recent Labs Lab 03/12/17 0502 03/14/17 0442  TROPONINI 2.23* 0.03*   ------------------------------------------------------------------------------------------------------------------  RADIOLOGY:  Dg Chest Port 1 View  Result Date: 03/13/2017 CLINICAL DATA:  Cough.  Central line placement. EXAM: PORTABLE CHEST 1 VIEW COMPARISON:  Radiographs yesterday at 2031 hour FINDINGS: Right internal jugular central venous catheter tip is in the region of the mid SVC. No pneumothorax. Cardiomegaly is stable. Left pleural effusion and left lung opacities are stable from prior exam. Right infrahilar atelectasis. IMPRESSION: Tip of the right internal jugular central venous catheter in the SVC. No pneumothorax. Unchanged left pleural effusion and left lung opacities. Electronically Signed   By: Jeb Levering M.D.   On: 03/13/2017 01:42   Dg Chest Port 1 View  Result Date: 03/12/2017 CLINICAL DATA:  Dyspnea EXAM: PORTABLE CHEST 1 VIEW COMPARISON:  03/03/2017 chest radiograph. FINDINGS: Low lung volumes. Stable cardiomediastinal silhouette with cardiomegaly and aortic atherosclerosis. No pneumothorax. No right pleural effusion. Small left pleural effusion, increased. Mild pulmonary edema. Bibasilar lung opacities, increased on the left. IMPRESSION: 1. Mild congestive heart failure, new. 2. Small left pleural effusion, increased . 3. Bibasilar lung opacities, increased on the left, favor atelectasis. 4. Aortic atherosclerosis. Electronically Signed   By: Ilona Sorrel M.D.   On: 03/12/2017 23:57     ASSESSMENT AND PLAN:   83 year old female with ESRD postop left below-knee amputation for left foot ulceration/osteomyelitis.   1. Acute encephalopathy due  to ICU delirium and post op delirium/hypotension related? Continue to Try to orient patient  daily Keep awake during day and rest at night She is slowly improving   2. Postoperative day #4 left below the knee amputation: Management as per surgery  3 Postop hypotension on baseline chronic hypotension: Patient remains on pressors Continue Midodrine Hold on HTN medications wean hydrocortisone in the next few days   4 ESRD on hemodialysis as per nephrology will receive dialysis today  5. A. fib with RVR,: Due to low BP BB stopped. If she has persistent rapid rate will consider Amio or Dig  6. Diabetes: Continue Lantus with ADA diet and sliding scale   D/w dr Juanell Fairly and patient's husband    CODE STATUS: DNR  crititcal care TOTAL TIME TAKING CARE OF THIS PATIENT: 24 minutes.     POSSIBLE D/C 3-4 days, DEPENDING ON CLINICAL CONDITION.   [name] M.D on 03/14/2017 at 12:11 PM  Between 7am to 6pm - Pager - [phone] After 6pm go to [email or web address] - password [email or web address] [facility]  Office  [phone]  CC: Primary care physician; Connie Aus, MD  Note: This dictation was prepared with Dragon dictation along with smaller phrase technology. Any transcriptional errors that result from this process are unintentional.

## 2017-03-14 NOTE — Progress Notes (Signed)
[facility]  Daily Progress Note   Subjective  - 4 Days Post-Op  More awake this afternoon. Pressors weaning off and BP remains around 56Y-56L systolic which is her baseline.  Tolerated HD so far   Objective Vitals:   03/14/17 0900 03/14/17 1000 03/14/17 1043 03/14/17 1100  BP: (!) 125/56 (!) 89/53  (!) 92/50  Pulse:  (!) 125 (!) 126 (!) 126  Resp: (!) 21 (!) 24  17  Temp:      TempSrc:      SpO2:  97% 99% 100%  Weight:      Height:        Intake/Output Summary (Last 24 hours) at 03/14/17 1246 Last data filed at 03/14/17 0900  Gross per 24 hour  Intake          1364.54 ml  Output                0 ml  Net          1364.54 ml    PULM  CTAB CV  RRR VASC  Left BKA site C/D/I.  Bulky dressing removed today  Laboratory CBC    Component Value Date/Time   WBC 10.1 03/13/2017 0505   HGB 9.3 (L) 03/13/2017 0505   HGB 8.6 (L) 03/10/2015 0811   HCT 28.5 (L) 03/13/2017 0505   HCT 28.7 (L) 08/14/2015 1519   PLT 226 03/13/2017 0505   PLT 183 03/10/2015 0426    BMET    Component Value Date/Time   NA 134 (L) 03/14/2017 0442   NA 130 (L) 03/10/2015 0426   K 3.9 03/14/2017 0442   K 5.3 (H) 03/10/2015 0811   CL 101 03/14/2017 0442   CL 91 (L) 03/10/2015 0426   CO2 24 03/14/2017 0442   CO2 26 03/10/2015 0426   GLUCOSE 124 (H) 03/14/2017 0442   GLUCOSE 85 03/10/2015 0426   BUN 23 (H) 03/14/2017 0442   BUN 41 (H) 03/10/2015 0426   CREATININE 2.96 (H) 03/14/2017 0442   CREATININE 6.65 (H) 03/10/2015 0426   CALCIUM 8.6 (L) 03/14/2017 0442   CALCIUM 7.6 (L) 03/10/2015 0426   GFRNONAA 14 (L) 03/14/2017 0442   GFRNONAA 5 (L) 03/10/2015 0426   GFRAA 16 (L) 03/14/2017 0442   GFRAA 6 (L) 03/10/2015 0426    Assessment/Planning: POD #4 s/p left BKA   Hypotension. Really at her baseline and stable.  Wean off pressors and move out to floor today or tomorrow  Replace Xeroform and Kerlex wrap to stump daily or every other day depending on  drainage.  Plan for discharge to rehab/SNF Wednesday  Appreciate consultants input and assistance    Leotis Pain  03/14/2017, 12:46 PM

## 2017-03-14 NOTE — Progress Notes (Signed)
Pt extremely restless and agitated. Primary RN present. Mitts applied d/t pt pulling at lines. Systolic lower than baseline. UF off. Tachy. Lateef MD notified. Will discontinue this HD tx if it persists.

## 2017-03-14 NOTE — Progress Notes (Signed)
  End of hd 

## 2017-03-14 NOTE — Progress Notes (Signed)
Pre hd assessment  

## 2017-03-14 NOTE — Progress Notes (Signed)
Patient with eyes open throughtout most of the night, confused and slow to respond. Patient removed CPAP x3 during night third time getting aggressive with staff when trying to reapply. [place] on at 2L at this time. Neo-synephrine titrated down to 44mcg at this time  BP 97/70 MAP 80. Dressing to stump, sacrum and RLE CDI. RLE elevated on pillow with foot floating, continues to be edematous.
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
## 2017-03-14 NOTE — Progress Notes (Signed)
Post hd assessment 

## 2017-03-14 NOTE — Progress Notes (Signed)
Pharmacy Antibiotic Note  [name] is a 83 y.o. female admitted on 03/10/2017 with sepsis.  Pharmacy has been consulted for vancomycin dosing.  Plan: Based on patients PMH of ESRD w/HD- Will adjust cefepime to 1gm IV every 24 hours, to be given after dialysis on HD days.    Height: 5\' 1"  (154.9 cm) Weight: 213 lb 13.5 oz (97 kg) IBW/kg (Calculated) : 47.8  Temp (24hrs), Avg:97.4 F (36.3 C), Min:97 F (36.1 C), Max:97.7 F (36.5 C)   Recent Labs Lab 03/10/17 1716 03/11/17 0338 03/11/17 2094 03/12/17 0502 03/12/17 0628 03/13/17 0505 03/14/17 0442  WBC 14.3*  --  13.7*  --  13.3* 10.1  --   CREATININE 2.56* 2.90*  --  2.24*  --  2.56* 2.96*    Estimated Creatinine Clearance: 15.9 mL/min (A) (by C-G formula based on SCr of 2.96 mg/dL (H)).    Allergies  Allergen Reactions  . Angiotensin Receptor Blockers Other (See Comments)    Reaction:  Hyperkalemia  . Penicillins Rash and Other (See Comments)    rash    Thank you for allowing pharmacy to be a part of this patient's care.  Pernell Dupre, PharmD, BCPS Clinical Pharmacist 03/14/2017 1:28 PM

## 2017-03-14 NOTE — Progress Notes (Signed)
*  PRELIMINARY RESULTS* Echocardiogram 2D Echocardiogram has been performed.  Sherrie Sport 03/14/2017, 11:05 AM

## 2017-03-14 NOTE — Progress Notes (Signed)
Post hd vitals 

## 2017-03-14 NOTE — Progress Notes (Signed)
Pt weaned off of neo this am. Pt was calm and cooperative most of the day. Was able to answer questions correctly at times. Wound care nurse and OT came in for consults in the am. Vascular came into to assess her surgical dressing and take it off. Received orders to apply aquacel and wrap with kerlix. Also received orders to transfer pt. Administered prn pain medication twice in the morning. Husband at beside most of the day.  HD nurse came to start dialysis. Pt was initially calm but started to become increasingly confused, uncooperative and agitated. Pt became tachycardic in 150-170's. Paged Elink and NP. HD nurse paged Dr. Holley Raring. Administered metoprolol and morphine. Little to no change. Dialysis was ended early per Dr. Holley Raring. Pt began to calm down and heart rate stabilized to 115-125's.

## 2017-03-14 NOTE — Progress Notes (Signed)
PRE HD   

## 2017-03-14 NOTE — Care Management (Addendum)
Patient remains on  phenylephrine gtt. Correction on dialysis clinic- [facility] in [place] [facility]

## 2017-03-14 NOTE — Progress Notes (Signed)
*   [place] [place] [facility]     Assessment and Plan:  IMPRESSION: ESRD Peripheral arterial disease S/P L BKA Chronic hypotension with systolics in the 35-465 range; currently on phenylephrine at 40 mics. Chronic atrial fibrillation, rate adequately controlled Elevated troponin I, currently unremarkable. Refractory shock DNR/DNI L effusion, possible LLL PNA  PLAN/REC: Continue phenylephrine gtt to maintain MAP > 55 mcg/min; we will wean down as tolerated. Cont current abx, repeat chest x-ray. Continue midodrine and hydrocortisone, hold home dosing of propranolol Echocardiogram ordered - will be performed 04/02 Recheck trop I in AM 04/02 HD planned today DNR/DNI remains appropriate   Date: 03/14/2017  MRN# [id number] [name] [birth year]    Subjective  No new complaints today, patient is lethargic, arousable.  Medication:   Reviewed  Allergies:  Angiotensin receptor blockers and Penicillins  Review of Systems: Gen:  Denies  fever, sweats. HEENT: Denies blurred vision. Cvc:  No dizziness, chest pain or heaviness Resp:   Denies cough or sputum porduction. Gi: Denies swallowing difficulty, stomach pain. constipation, bowel incontinence Gu:  Denies bladder incontinence, burning urine Ext:   No Joint pain, stiffness. Skin: No skin rash, easy bruising. Endoc:  No polyuria, polydipsia. Psych: No depression, insomnia. Other:  All other systems were reviewed and found to be negative other than what is mentioned in the HPI.   Physical Examination:   VS: BP 97/70   Pulse (!) 117   Temp 97.4 F (36.3 C) (Axillary)   Resp (!) 22   Ht 5\' 1"  (1.549 m)   Wt 213 lb 13.5 oz (97 kg)   SpO2 100%   BMI 40.41 kg/m   General Appearance: No distress  Neuro:without focal findings,  speech normal,  HEENT: PERRLA, EOM intact. Pulmonary: normal breath sounds, No wheezing.   CardiovascularNormal S1,S2.  No m/r/g.   Abdomen: Benign, Soft, non-tender. Renal:  No  costovertebral tenderness  GU:  Not performed at this time. Endoc: No evident thyromegaly, no signs of acromegaly. Skin:   warm, no rash. Extremities: normal, no cyanosis, clubbing.   LABORATORY PANEL:   CBC  Recent Labs Lab 03/13/17 0505  WBC 10.1  HGB 9.3*  HCT 28.5*  PLT 226   ------------------------------------------------------------------------------------------------------------------  Chemistries   Recent Labs Lab 03/14/17 0442  NA 134*  K 3.9  CL 101  CO2 24  GLUCOSE 124*  BUN 23*  CREATININE 2.96*  CALCIUM 8.6*   ------------------------------------------------------------------------------------------------------------------  Cardiac Enzymes  Recent Labs Lab 03/14/17 0442  TROPONINI 0.03*   ------------------------------------------------------------  RADIOLOGY:   No results found for this or any previous visit. Results for orders placed during the hospital encounter of 03/03/17  Chest 2 View   Narrative CLINICAL DATA:  Preop.  EXAM: CHEST  2 VIEW  COMPARISON:  09/26/2016  FINDINGS: Cardiomegaly. Low lung volumes with bibasilar atelectasis or infiltrates, left greater than right. No effusions. No overt edema. No acute bony abnormality.  IMPRESSION: Low lung volumes with bibasilar atelectasis or infiltrates, left greater than right.  Cardiomegaly.   Electronically Signed   By: Rolm Baptise M.D.   On: 03/03/2017 09:53    ------------------------------------------------------------------------------------------------------------------  Thank  you for allowing [facility] to assist in the care of your patient. Our recommendations are noted above.  Please contact us if we can be of further service.   Marda Stalker, MD.  [place] [place] [facility] Office Number: [phone]  Patricia Pesa, M.D.  Merton Border, M.D  03/14/2017

## 2017-03-14 NOTE — Progress Notes (Signed)
HD initiated without issue  

## 2017-03-14 NOTE — Progress Notes (Signed)
OT Cancellation Note  Patient Details Name: [name] MRN: [id number] DOB: 1934/06/22   Cancelled Treatment:    Reason Eval/Treat Not Completed: Patient at procedure or test/ unavailable. Chart reviewed, spoke to RN, pt currently in ECHO, will call when done for OT to re-attempt OT evaluation prior to HD.   Jeni Salles, MPH, MS, OTR/L ascom [phone] 03/14/17, 9:31 AM

## 2017-03-14 NOTE — Consult Note (Signed)
[place] Nurse wound follow up Reason for Consult: RLE chronic nonhealing wounds. Seen at [facility].   S/P L BKA, will be managed by surgical services.  Right facial lesion, scabbed.  Caused by CPAP machine at home, per patient.  WIll need padding with silicone border foam dressing for protection with CPAP.  Unnas boots from Friday had been removed when I arrived to room today. Bedside RN unsure when.  WIll reapply and perform wound care. Spouse at bedside.  Wound type:Chronic nonhealing. Pressure Injury POA: Yes Measurement:Right dorsal foot:  4 cm x 2.8 cm with visible tendon. Right posterior lower leg/Achilles tendon region Wound YYT:KPTWSF and 75% devitalized tissue. Drainage (amount, consistency, odor) Moderate serosanguinous  Musty odor.  Periwound:Dry skin.  Chronic changes, edema Dressing procedure/placement/frequency:Cleanse right leg with soap and water.  Aquacel Ag to wound bed.  Cover with kerlix layer from below toes to below knee.  Secure with Coban layer.  Change three times weekly.  Mon/Wed/Fri. [place] team will follow.  Domenic Moras RN BSN [place] Pager [phone]
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
## 2017-03-14 NOTE — Progress Notes (Signed)
Subjective:  Patient awakened this a.m. She remains confused. She is due for hemodialysis later today.  Objective:  Vital signs in last 24 hours:  Temp:  [97 F (36.1 C)-97.7 F (36.5 C)] 97.3 F (36.3 C) (04/02 0800) Pulse Rate:  [110-129] 112 (04/02 0800) Resp:  [14-28] 21 (04/02 0900) BP: (73-125)/(41-70) 125/56 (04/02 0900) SpO2:  [86 %-100 %] 100 % (04/02 0800)  Weight change:  Filed Weights   03/10/17 1653 03/11/17 1015 03/11/17 1345  Weight: 95.5 kg (210 lb 9.6 oz) 98.2 kg (216 lb 7.9 oz) 97 kg (213 lb 13.5 oz)    Intake/Output:    Intake/Output Summary (Last 24 hours) at 03/14/17 1015 Last data filed at 03/14/17 0900  Gross per 24 hour  Intake          1364.54 ml  Output                0 ml  Net          1364.54 ml     Physical Exam: General: Laying in bed, MAD  HEENT Moist oral mucus membranes  Neck supple  Pulm/lungs Normal effort, clear to auscultation  CVS/Heart Irregular, A Fib  Abdomen:  Soft, NTND  Extremities: Left BKA, Rt foot in bandage,  + edema over thigh  Neurologic: Awake, alert, but confused  Access: Left forearm AVF          Basic Metabolic Panel:   Recent Labs Lab 03/10/17 1052 03/10/17 1716  03/11/17 0338 03/12/17 0502 03/13/17 0505 03/14/17 0442  NA 132*  --   --  131* 136 133* 134*  K 3.2*  --   --  4.2 4.6 3.8 3.9  CL  --   --   --  95* 100* 99* 101  CO2  --   --   --  24 24 25 24   GLUCOSE 133*  --   --  148* 111* 143* 124*  BUN  --   --   --  15 12 17  23*  CREATININE  --  2.56*  --  2.90* 2.24* 2.56* 2.96*  CALCIUM  --   --   < > 8.5* 8.6* 8.4* 8.6*  PHOS  --   --   --   --   --  3.2  --   < > = values in this interval not displayed.   CBC:  Recent Labs Lab 03/10/17 1052 03/10/17 1716 03/11/17 0613 03/12/17 0628 03/13/17 0505  WBC  --  14.3* 13.7* 13.3* 10.1  HGB 12.6 10.9* 11.5* 10.1* 9.3*  HCT 37.0 32.9* 35.1 30.8* 28.5*  MCV  --  99.5 102.2* 101.5* 101.7*  PLT  --  184 192 219 226       Microbiology:  Recent Results (from the past 720 hour(s))  Aerobic Culture (superficial specimen)     Status: None   Collection Time: 02/22/17  3:50 PM  Result Value Ref Range Status   Specimen Description FOOT LEFT  Final   Special Requests CALCANEUS  Final   Gram Stain   Final    RARE WBC PRESENT, PREDOMINANTLY PMN ABUNDANT GRAM POSITIVE COCCI IN PAIRS ABUNDANT GRAM NEGATIVE COCCOBACILLI FEW YEAST Performed at [facility], [address], [place], [place] [address]    Culture   Final    ABUNDANT KLEBSIELLA PNEUMONIAE ABUNDANT ENTEROCOCCUS FAECALIS    Report Status 02/27/2017 FINAL  Final   Organism ID, Bacteria KLEBSIELLA PNEUMONIAE  Final   Organism ID, Bacteria ENTEROCOCCUS  FAECALIS  Final      Susceptibility   Enterococcus faecalis - MIC*    AMPICILLIN <=2 SENSITIVE Sensitive     VANCOMYCIN 1 SENSITIVE Sensitive     GENTAMICIN SYNERGY RESISTANT Resistant     * ABUNDANT ENTEROCOCCUS FAECALIS   Klebsiella pneumoniae - MIC*    AMPICILLIN >=32 RESISTANT Resistant     CEFAZOLIN <=4 SENSITIVE Sensitive     CEFEPIME <=1 SENSITIVE Sensitive     CEFTAZIDIME <=1 SENSITIVE Sensitive     CEFTRIAXONE <=1 SENSITIVE Sensitive     CIPROFLOXACIN <=0.25 SENSITIVE Sensitive     GENTAMICIN <=1 SENSITIVE Sensitive     IMIPENEM <=0.25 SENSITIVE Sensitive     TRIMETH/SULFA <=20 SENSITIVE Sensitive     AMPICILLIN/SULBACTAM 4 SENSITIVE Sensitive     PIP/TAZO <=4 SENSITIVE Sensitive     Extended ESBL NEGATIVE Sensitive     * ABUNDANT KLEBSIELLA PNEUMONIAE  Surgical pcr screen     Status: Abnormal   Collection Time: 03/03/17  8:45 AM  Result Value Ref Range Status   MRSA, PCR POSITIVE (A) NEGATIVE Final    Comment: RESULT CALLED TO, READ BACK BY AND VERIFIED WITH: GAIL GRAVES 03/03/17 1140 SGD    Staphylococcus aureus POSITIVE (A) NEGATIVE Final    Comment:        The Xpert SA Assay (FDA approved for NASAL specimens in patients over 72 years of age), is one component  of a comprehensive surveillance program.  Test performance has been validated by [facility] for patients greater than or equal to 36 year old. It is not intended to diagnose infection nor to guide or monitor treatment.     Coagulation Studies: No results for input(s): LABPROT, INR in the last 72 hours.  Urinalysis: No results for input(s): COLORURINE, LABSPEC, PHURINE, GLUCOSEU, HGBUR, BILIRUBINUR, KETONESUR, PROTEINUR, UROBILINOGEN, NITRITE, LEUKOCYTESUR in the last 72 hours.  Invalid input(s): APPERANCEUR    Imaging: Dg Chest Port 1 View  Result Date: 03/13/2017 CLINICAL DATA:  Cough.  Central line placement. EXAM: PORTABLE CHEST 1 VIEW COMPARISON:  Radiographs yesterday at 2031 hour FINDINGS: Right internal jugular central venous catheter tip is in the region of the mid SVC. No pneumothorax. Cardiomegaly is stable. Left pleural effusion and left lung opacities are stable from prior exam. Right infrahilar atelectasis. IMPRESSION: Tip of the right internal jugular central venous catheter in the SVC. No pneumothorax. Unchanged left pleural effusion and left lung opacities. Electronically Signed   By: Jeb Levering M.D.   On: 03/13/2017 01:42   Dg Chest Port 1 View  Result Date: 03/12/2017 CLINICAL DATA:  Dyspnea EXAM: PORTABLE CHEST 1 VIEW COMPARISON:  03/03/2017 chest radiograph. FINDINGS: Low lung volumes. Stable cardiomediastinal silhouette with cardiomegaly and aortic atherosclerosis. No pneumothorax. No right pleural effusion. Small left pleural effusion, increased. Mild pulmonary edema. Bibasilar lung opacities, increased on the left. IMPRESSION: 1. Mild congestive heart failure, new. 2. Small left pleural effusion, increased . 3. Bibasilar lung opacities, increased on the left, favor atelectasis. 4. Aortic atherosclerosis. Electronically Signed   By: Ilona Sorrel M.D.   On: 03/12/2017 23:57     Medications:   . phenylephrine (NEO-SYNEPHRINE) Adult infusion 20 mcg/min  (03/14/17 0918)   . albumin human  12.5 g Intravenous Once in dialysis  . [START ON 03/15/2017] ceFEPIme  1 g Intravenous Q24H  . docusate sodium  100 mg Oral BID  . epoetin (EPOGEN/PROCRIT) injection  4,000 Units Intravenous Q M,W,F-HD  . famotidine  20 mg Oral Daily  .  feeding supplement (NEPRO CARB STEADY)  237 mL Oral BID BM  . heparin  5,000 Units Subcutaneous Q8H  . hydrocortisone sod succinate (SOLU-CORTEF) inj  50 mg Intravenous Q12H  . insulin aspart  0-9 Units Subcutaneous TID WC  . insulin glargine  10 Units Subcutaneous QHS  . mouth rinse  15 mL Mouth Rinse BID  . megestrol  40 mg Oral Daily  . midodrine  10 mg Oral TID  . multivitamin  1 tablet Oral Daily  . pantoprazole  40 mg Oral Daily  . vancomycin  1,250 mg Intravenous Once   acetaminophen **OR** acetaminophen, alum & mag hydroxide-simeth, fentaNYL (SUBLIMAZE) injection, guaiFENesin-dextromethorphan, HYDROcodone-acetaminophen, ipratropium-albuterol, magnesium sulfate 1 - 4 g bolus IVPB, metoprolol, morphine injection, ondansetron, oxyCODONE-acetaminophen, phenol, polyethylene glycol, promethazine, senna, sorbitol, traMADol  Assessment/ Plan:  83 y.o. female   with end-stage renal disease on hemodialysis, diabetes mellitus type 2, hypertension, obstructive sleep apnea, atrial fibrillation, arthritis, anemia, distal right femoral fracture, admitted on 09/26/2016 for Leg edema [R60.0] Venous thrombosis [I82.90] ,rt leg DVT  MWF [facility]  1. End-stage renal disease-  MWF - Patient due for hemodialysis today. Orders have been prepared.  2.  Hypotension:  - Still on phenylephrine. Patient has chronic hypotension. Her systolic blood pressure is normally in the 80s.   3. Anemia of chronic kidney disease - Continue Epogen with dialysis. Currently 9.3.  4. Secondary hyperparathyroidism:   Phosphorus currently 3.2. Continue to monitor.  5. LE edema Ultrafiltration target is 1-1.5 kg today.     LOS:  4 Deyona Soza 4/2/201810:15 AM

## 2017-03-15 ENCOUNTER — Inpatient Hospital Stay: Payer: Medicare Other

## 2017-03-15 LAB — BLOOD GAS, ARTERIAL
ACID-BASE EXCESS: 5.7 mmol/L — AB (ref 0.0–2.0)
Bicarbonate: 30.6 mmol/L — ABNORMAL HIGH (ref 20.0–28.0)
FIO2: 0.28
O2 Saturation: 96.7 %
PH ART: 7.44 (ref 7.350–7.450)
Patient temperature: 37
pCO2 arterial: 45 mmHg (ref 32.0–48.0)
pO2, Arterial: 84 mmHg (ref 83.0–108.0)

## 2017-03-15 LAB — GLUCOSE, CAPILLARY
GLUCOSE-CAPILLARY: 66 mg/dL (ref 65–99)
GLUCOSE-CAPILLARY: 144 mg/dL — AB (ref 65–99)
GLUCOSE-CAPILLARY: 63 mg/dL — AB (ref 65–99)
Glucose-Capillary: 64 mg/dL — ABNORMAL LOW (ref 65–99)
Glucose-Capillary: 144 mg/dL — ABNORMAL HIGH (ref 65–99)

## 2017-03-15 LAB — BASIC METABOLIC PANEL
Anion gap: 10 (ref 5–15)
BUN: 15 mg/dL (ref 6–20)
CHLORIDE: 99 mmol/L — AB (ref 101–111)
CO2: 28 mmol/L (ref 22–32)
CREATININE: 2.17 mg/dL — AB (ref 0.44–1.00)
Calcium: 8.5 mg/dL — ABNORMAL LOW (ref 8.9–10.3)
GFR calc non Af Amer: 20 mL/min — ABNORMAL LOW (ref >60)
GFR, EST AFRICAN AMERICAN: 23 mL/min — AB (ref >60)
Glucose, Bld: 97 mg/dL (ref 65–99)
Potassium: 3.5 mmol/L (ref 3.5–5.1)
Sodium: 137 mmol/L (ref 135–145)

## 2017-03-15 LAB — CBC
HCT: 28.4 % — ABNORMAL LOW (ref 35.0–47.0)
HEMOGLOBIN: 9.4 g/dL — AB (ref 12.0–16.0)
MCH: 33.8 pg (ref 26.0–34.0)
MCHC: 33.1 g/dL (ref 32.0–36.0)
MCV: 102.2 fL — ABNORMAL HIGH (ref 80.0–100.0)
Platelets: 153 10*3/uL (ref 150–440)
RBC: 2.78 MIL/uL — ABNORMAL LOW (ref 3.80–5.20)
RDW: 17 % — ABNORMAL HIGH (ref 11.5–14.5)
WBC: 9.2 10*3/uL (ref 3.6–11.0)

## 2017-03-15 LAB — TROPONIN I
TROPONIN I: 0.04 ng/mL — AB (ref <0.03)
TROPONIN I: 0.04 ng/mL — AB (ref <0.03)

## 2017-03-15 LAB — MAGNESIUM: Magnesium: 1.7 mg/dL (ref 1.7–2.4)

## 2017-03-15 MED ORDER — PHENYLEPHRINE HCL 10 MG/ML IJ SOLN
0.0000 ug/min | INTRAMUSCULAR | Status: DC
  Administered 2017-03-15 – 2017-03-16 (×4): 20 ug/min via INTRAVENOUS
  Administered 2017-03-16: 30 ug/min via INTRAVENOUS
  Administered 2017-03-17: 5 ug/min via INTRAVENOUS
  Administered 2017-03-18: 15 ug/min via INTRAVENOUS
  Administered 2017-03-19: 5 ug/min via INTRAVENOUS
  Administered 2017-03-19: 15 ug/min via INTRAVENOUS
  Filled 2017-03-15 (×3): qty 1
  Filled 2017-03-15: qty 10
  Filled 2017-03-15 (×4): qty 1

## 2017-03-15 MED ORDER — DEXTROSE 50 % IV SOLN
INTRAVENOUS | Status: AC
  Administered 2017-03-15: 50 mL
  Filled 2017-03-15: qty 50

## 2017-03-15 MED ORDER — DILTIAZEM HCL 25 MG/5ML IV SOLN
10.0000 mg | Freq: Once | INTRAVENOUS | Status: AC
  Administered 2017-03-15: 10 mg via INTRAVENOUS
  Filled 2017-03-15: qty 5

## 2017-03-15 MED ORDER — HYDROCORTISONE NA SUCCINATE PF 100 MG IJ SOLR
25.0000 mg | Freq: Two times a day (BID) | INTRAMUSCULAR | Status: DC
  Administered 2017-03-15 – 2017-03-19 (×9): 25 mg via INTRAVENOUS
  Filled 2017-03-15 (×9): qty 2

## 2017-03-15 MED ORDER — PANTOPRAZOLE SODIUM 40 MG IV SOLR
40.0000 mg | INTRAVENOUS | Status: DC
  Administered 2017-03-15 – 2017-03-16 (×2): 40 mg via INTRAVENOUS
  Filled 2017-03-15 (×2): qty 40

## 2017-03-15 MED ORDER — DEXTROSE 50 % IV SOLN
INTRAVENOUS | Status: AC
Start: 2017-03-15 — End: 2017-03-15
  Administered 2017-03-15: 50 mL
  Filled 2017-03-15: qty 50

## 2017-03-15 MED ORDER — INSULIN GLARGINE 100 UNIT/ML ~~LOC~~ SOLN
8.0000 [IU] | Freq: Every day | SUBCUTANEOUS | Status: DC
  Filled 2017-03-15: qty 0.08

## 2017-03-15 NOTE — Progress Notes (Signed)
[place] [facility] [place] NAME: [name]    MR#:  [id number]  DATE OF BIRTH:  February 21, 1934  SUBJECTIVE:  Patient Is showing significant decline in her status. Mental status very poor this morning. Patient is nothing by mouth currently due to mental status. Blood pressure is low.  Allergies care consultation has been requested REVIEW OF SYSTEMS:    Review of Systems  Unable to perform ROS: Acuity of condition      Tolerating Diet: npo      DRUG ALLERGIES:   Allergies  Allergen Reactions  . Angiotensin Receptor Blockers Other (See Comments)    Reaction:  Hyperkalemia  . Penicillins Rash and Other (See Comments)    rash    VITALS:  Blood pressure (!) 79/45, pulse (!) 117, temperature 97.7 F (36.5 C), temperature source Oral, resp. rate 15, height 5\' 1"  (1.549 m), weight 99.8 kg (220 lb 0.3 oz), SpO2 100 %.  PHYSICAL EXAMINATION:   Physical Exam  Constitutional: [name], well-nourished, and in no distress. No distress.  HENT:  Head: Normocephalic.  Eyes: No scleral icterus.  Neck: Neck supple. No JVD present. No tracheal deviation present.  Cardiovascular: Normal rate and normal heart sounds.  Exam reveals no gallop and no friction rub.   No murmur heard. irr, irr  Pulmonary/Chest: Effort normal and breath sounds normal. No respiratory distress. She has no wheezes. She has no rales. She exhibits no tenderness.  Abdominal: Soft. Bowel sounds are normal. She exhibits no distension and no mass. There is no tenderness. There is no rebound and no guarding.  Musculoskeletal: She exhibits edema.  Left BKA  Neurological:  Lethargic and not very responsive  Skin: Skin is warm. Rash noted. She is not diaphoretic. No erythema.  Psychiatric:  Lethargic      LABORATORY PANEL:   CBC  Recent Labs Lab 03/15/17 0534  WBC 9.2  HGB 9.4*  HCT 28.4*  PLT 153    ------------------------------------------------------------------------------------------------------------------  Chemistries   Recent Labs Lab 03/15/17 0534  NA 137  K 3.5  CL 99*  CO2 28  GLUCOSE 97  BUN 15  CREATININE 2.17*  CALCIUM 8.5*  MG 1.7   ------------------------------------------------------------------------------------------------------------------  Cardiac Enzymes  Recent Labs Lab 03/12/17 0502 03/14/17 0442  TROPONINI 2.23* 0.03*   ------------------------------------------------------------------------------------------------------------------  RADIOLOGY:  Dg Chest 1 View  Result Date: 03/15/2017 CLINICAL DATA:  83 y/o  F; dyspnea. EXAM: CHEST 1 VIEW COMPARISON:  03/13/2017 chest radiograph. FINDINGS: Stable right central venous catheter tip projecting over mid SVC. Stable vascular congestion. Stable moderate left-sided pleural effusion and left basilar opacity probably representing atelectasis. Multiple acute lateral rib fractures are again seen. Dense mitral annular calcifications. Stable cardiac silhouette given projection and technique. IMPRESSION: Stable vascular congestion, left pleural effusion, and left basilar opacity probably representing associated atelectasis. Multiple stable left-sided acute rib fractures again seen. Electronically Signed   By: Kristine Garbe M.D.   On: 03/15/2017 05:55     ASSESSMENT AND PLAN:   82 year old female with ESRD postop left below-knee amputation for left foot ulceration/osteomyelitis.   1. Acute encephalopathy due to ICU delirium and post op delirium/hypotension related? Patient has declined significantly overnight. Palliative care is crucial   2. Postoperative day #5 left below the knee amputation: Management as per surgery  3 Postop hypotension on baseline chronic hypotension: [name] unable to take in any oral medications due to mental status  Patient may need to start on pressors   Discussed  with nursing  Continue hydrocortisone in the next few days   4 ESRD on hemodialysis as per nephrology No plans for dialysis today  5. A. fib with RVR,: Due to low BP BB stopped.  6. Diabetes: Continue sliding scale.  Stop Lantus due to nothing by mouth status   7. RLE chronic nonhealing wounds: :Cleanse right leg with soap and water. Aquacel Ag to wound bed. Cover with kerlix layer from below toes to below knee. Secure with Coban layer. Change three times weekly. Mon/Wed/Fri. D/w dr Juanell Fairly and patient's husband  Patient has significantly declined in status. Palliative care consultation has been requested   CODE STATUS: DNR  crititcal care TOTAL TIME TAKING CARE OF THIS PATIENT: 21 minutes.     POSSIBLE D/C ?, DEPENDING ON CLINICAL CONDITION.   Allex Madia M.D on 03/15/2017 at 12:41 PM  Between 7am to 6pm - Pager - [phone] After 6pm go to [email or web address] - password [email or web address] [facility]  Office  [phone]  CC: Primary care physician; Connie Aus, MD  Note: This dictation was prepared with Dragon dictation along with smaller phrase technology. Any transcriptional errors that result from this process are unintentional.

## 2017-03-15 NOTE — Progress Notes (Signed)
Inpatient Diabetes Program Recommendations  AACE/ADA: New Consensus Statement on Inpatient Glycemic Control (2015)  Target Ranges:  Prepandial:   less than 140 mg/dL      Peak postprandial:   less than 180 mg/dL (1-2 hours)      Critically ill patients:  140 - 180 mg/dL   Lab Results  Component Value Date   GLUCAP 66 03/15/2017   HGBA1C 6.0 04/07/2016    Review of Glycemic Control  Results for NOVELLE, ADDAIR (MRN [id number]) as of 03/15/2017 11:39  Ref. Range 03/14/2017 07:19 03/14/2017 11:30 03/14/2017 16:15 03/14/2017 21:28 03/15/2017 07:38  Glucose-Capillary Latest Ref Range: 65 - 99 mg/dL 113 (H) 144 (H) 131 (H) 106 (H) 66    Diabetes history: Type 2 Outpatient Diabetes medications: Lantus 10 units qhs Current orders for Inpatient glycemic control: Lantus 8 units qhs, Novolog 0-9 units tid  Inpatient Diabetes Program Recommendations:  Consider d/c Lantus insulin until CBG are consistently above 180mg /dl.  Continue Novolog sensitive correction scale as ordered.   Gentry Fitz, RN, BA, MHA, CDE Diabetes Coordinator Inpatient Diabetes Program  [phone] (Team Pager) [phone] ([place]) 03/15/2017 11:43 AM

## 2017-03-15 NOTE — Progress Notes (Signed)
Pharmacy Antibiotic Note  Kalyssa Anker is a 83 y.o. female admitted on 03/10/2017 with sepsis.  Pharmacy has been consulted for vancomycin dosing.  Plan: Will continue cefepime to 1gm IV every 24 hours, to be given after dialysis on HD days.    Height: 5\' 1"  (154.9 cm) Weight: 220 lb 0.3 oz (99.8 kg) IBW/kg (Calculated) : 47.8  Temp (24hrs), Avg:98 F (36.7 C), Min:97.7 F (36.5 C), Max:98.1 F (36.7 C)   Recent Labs Lab 03/10/17 1716 03/11/17 0338 03/11/17 0600 03/12/17 0502 03/12/17 0628 03/13/17 0505 03/14/17 0442 03/15/17 0534  WBC 14.3*  --  13.7*  --  13.3* 10.1  --  9.2  CREATININE 2.56* 2.90*  --  2.24*  --  2.56* 2.96* 2.17*    Estimated Creatinine Clearance: 22 mL/min (A) (by C-G formula based on SCr of 2.17 mg/dL (H)).    Allergies  Allergen Reactions  . Angiotensin Receptor Blockers Other (See Comments)    Reaction:  Hyperkalemia  . Penicillins Rash and Other (See Comments)    rash    Thank you for allowing pharmacy to be a part of this patient's care.  Pernell Dupre, PharmD, BCPS Clinical Pharmacist 03/15/2017 11:28 AM

## 2017-03-15 NOTE — Progress Notes (Signed)
Pt placed on [facility] CPAP C-4. CPAP is plugged into a red outlet. 2L O2 in line

## 2017-03-15 NOTE — Progress Notes (Signed)
PT Cancellation Note  Patient Details Name: [name] MRN: [id number] DOB: 10-Oct-1934   Cancelled Treatment:    Reason Eval/Treat Not Completed: Fatigue/lethargy limiting ability to participate. Spoke with nursing regarding appropriateness of PT today due to chart review, which notes decline in pt status overall. Nursing notes pt has been quite lethargic, but states it would be good to attempt PT. Treatment attempted; family members in the room. Diligent attempt to awaken pt; pt obtunded and only quietly moans several times, otherwise, no response. Re attempt treatment as appropriate at a later time/date as the schedule allows.    Larae Grooms, PTA 03/15/2017, 11:19 AM

## 2017-03-15 NOTE — Progress Notes (Signed)
Subjective:  Patient was unstable during dialysis yesterday. She is very lethargic this a.m. Her CODE STATUS is DO NOT RESUSCITATE. She appears to be declining.   Objective:  Vital signs in last 24 hours:  Temp:  [97.7 F (36.5 C)-98.1 F (36.7 C)] 97.7 F (36.5 C) (04/03 0800) Pulse Rate:  [105-162] 113 (04/03 0900) Resp:  [13-30] 13 (04/03 0900) BP: (67-112)/(33-71) 82/46 (04/03 0900) SpO2:  [79 %-100 %] 96 % (04/03 0900) Weight:  [99.8 kg (220 lb 0.3 oz)] 99.8 kg (220 lb 0.3 oz) (04/02 1528)  Weight change:  Filed Weights   03/11/17 1015 03/11/17 1345 03/14/17 1528  Weight: 98.2 kg (216 lb 7.9 oz) 97 kg (213 lb 13.5 oz) 99.8 kg (220 lb 0.3 oz)    Intake/Output:    Intake/Output Summary (Last 24 hours) at 03/15/17 0929 Last data filed at 03/14/17 1900  Gross per 24 hour  Intake                0 ml  Output              -29 ml  Net               29 ml     Physical Exam: General: Laying in bed, lethargic  HEENT Moist oral mucus membranes  Neck supple  Pulm/lungs Normal effort, clear to auscultation  CVS/Heart Irregular, A Fib, tachycardic  Abdomen:  Soft, NTND  Extremities: Left BKA, Rt foot in bandage,  + edema over thigh  Neurologic: Lethargic, difficult to arouse  Access: Left forearm AVF          Basic Metabolic Panel:   Recent Labs Lab 03/11/17 0338 03/12/17 0502 03/13/17 0505 03/14/17 0442 03/15/17 0534  NA 131* 136 133* 134* 137  K 4.2 4.6 3.8 3.9 3.5  CL 95* 100* 99* 101 99*  CO2 24 24 25 24 28   GLUCOSE 148* 111* 143* 124* 97  BUN 15 12 17  23* 15  CREATININE 2.90* 2.24* 2.56* 2.96* 2.17*  CALCIUM 8.5* 8.6* 8.4* 8.6* 8.5*  MG  --   --   --   --  1.7  PHOS  --   --  3.2  --   --      CBC:  Recent Labs Lab 03/10/17 1716 03/11/17 0613 03/12/17 0628 03/13/17 0505 03/15/17 0534  WBC 14.3* 13.7* 13.3* 10.1 9.2  HGB 10.9* 11.5* 10.1* 9.3* 9.4*  HCT 32.9* 35.1 30.8* 28.5* 28.4*  MCV 99.5 102.2* 101.5* 101.7* 102.2*  PLT 184 192  219 226 153      Microbiology:  Recent Results (from the past 720 hour(s))  Aerobic Culture (superficial specimen)     Status: None   Collection Time: 02/22/17  3:50 PM  Result Value Ref Range Status   Specimen Description FOOT LEFT  Final   Special Requests CALCANEUS  Final   Gram Stain   Final    RARE WBC PRESENT, PREDOMINANTLY PMN ABUNDANT GRAM POSITIVE COCCI IN PAIRS ABUNDANT GRAM NEGATIVE COCCOBACILLI FEW YEAST Performed at [facility], [address], [place], [place] [address]    Culture   Final    ABUNDANT KLEBSIELLA PNEUMONIAE ABUNDANT ENTEROCOCCUS FAECALIS    Report Status 02/27/2017 FINAL  Final   Organism ID, Bacteria KLEBSIELLA PNEUMONIAE  Final   Organism ID, Bacteria ENTEROCOCCUS FAECALIS  Final      Susceptibility   Enterococcus faecalis - MIC*    AMPICILLIN <=2 SENSITIVE Sensitive     VANCOMYCIN  1 SENSITIVE Sensitive     GENTAMICIN SYNERGY RESISTANT Resistant     * ABUNDANT ENTEROCOCCUS FAECALIS   Klebsiella pneumoniae - MIC*    AMPICILLIN >=32 RESISTANT Resistant     CEFAZOLIN <=4 SENSITIVE Sensitive     CEFEPIME <=1 SENSITIVE Sensitive     CEFTAZIDIME <=1 SENSITIVE Sensitive     CEFTRIAXONE <=1 SENSITIVE Sensitive     CIPROFLOXACIN <=0.25 SENSITIVE Sensitive     GENTAMICIN <=1 SENSITIVE Sensitive     IMIPENEM <=0.25 SENSITIVE Sensitive     TRIMETH/SULFA <=20 SENSITIVE Sensitive     AMPICILLIN/SULBACTAM 4 SENSITIVE Sensitive     PIP/TAZO <=4 SENSITIVE Sensitive     Extended ESBL NEGATIVE Sensitive     * ABUNDANT KLEBSIELLA PNEUMONIAE  Surgical pcr screen     Status: Abnormal   Collection Time: 03/03/17  8:45 AM  Result Value Ref Range Status   MRSA, PCR POSITIVE (A) NEGATIVE Final    Comment: RESULT CALLED TO, READ BACK BY AND VERIFIED WITH: GAIL GRAVES 03/03/17 1140 SGD    Staphylococcus aureus POSITIVE (A) NEGATIVE Final    Comment:        The Xpert SA Assay (FDA approved for NASAL specimens in patients over 27 years of age), is  one component of a comprehensive surveillance program.  Test performance has been validated by [facility] for patients greater than or equal to 35 year old. It is not intended to diagnose infection nor to guide or monitor treatment.     Coagulation Studies: No results for input(s): LABPROT, INR in the last 72 hours.  Urinalysis: No results for input(s): COLORURINE, LABSPEC, PHURINE, GLUCOSEU, HGBUR, BILIRUBINUR, KETONESUR, PROTEINUR, UROBILINOGEN, NITRITE, LEUKOCYTESUR in the last 72 hours.  Invalid input(s): APPERANCEUR    Imaging: Dg Chest 1 View  Result Date: 03/15/2017 CLINICAL DATA:  83 y/o  F; dyspnea. EXAM: CHEST 1 VIEW COMPARISON:  03/13/2017 chest radiograph. FINDINGS: Stable right central venous catheter tip projecting over mid SVC. Stable vascular congestion. Stable moderate left-sided pleural effusion and left basilar opacity probably representing atelectasis. Multiple acute lateral rib fractures are again seen. Dense mitral annular calcifications. Stable cardiac silhouette given projection and technique. IMPRESSION: Stable vascular congestion, left pleural effusion, and left basilar opacity probably representing associated atelectasis. Multiple stable left-sided acute rib fractures again seen. Electronically Signed   By: Kristine Garbe M.D.   On: 03/15/2017 05:55     Medications:    . albumin human  12.5 g Intravenous Once in dialysis  . ceFEPIme  1 g Intravenous Q24H  . docusate sodium  100 mg Oral BID  . epoetin (EPOGEN/PROCRIT) injection  4,000 Units Intravenous Q M,W,F-HD  . famotidine  20 mg Oral Daily  . feeding supplement (NEPRO CARB STEADY)  237 mL Oral BID BM  . heparin  5,000 Units Subcutaneous Q8H  . hydrocortisone sod succinate (SOLU-CORTEF) inj  25 mg Intravenous Q12H  . insulin aspart  0-9 Units Subcutaneous TID WC  . insulin glargine  10 Units Subcutaneous QHS  . mouth rinse  15 mL Mouth Rinse BID  . megestrol  40 mg Oral Daily  .  midodrine  10 mg Oral TID  . multivitamin  1 tablet Oral Daily  . pantoprazole  40 mg Oral Daily   acetaminophen **OR** acetaminophen, alum & mag hydroxide-simeth, fentaNYL (SUBLIMAZE) injection, guaiFENesin-dextromethorphan, ipratropium-albuterol, magnesium sulfate 1 - 4 g bolus IVPB, metoprolol, morphine injection, ondansetron, oxyCODONE-acetaminophen, phenol, polyethylene glycol, promethazine, senna, sorbitol  Assessment/ Plan:  83 y.o. female  with end-stage renal disease on hemodialysis, diabetes mellitus type 2, hypertension, obstructive sleep apnea, atrial fibrillation, arthritis, anemia, distal right femoral fracture, admitted on 09/26/2016 for Leg edema [R60.0] Venous thrombosis [I82.90] ,rt leg DVT  MWF [place]  1. End-stage renal disease-  MWF - Pt didn't tolerate dialysis well yesterday, he has been increasingly difficult to perform dialysis on the patient given hypotension and tachycardia. We will reassess the patient for dialysis tomorrow.  2.  Hypotension:  - Patient now off of phenylephrine. Continue to monitor blood pressure.   3. Anemia of chronic kidney disease - Hemoglobin relatively stable at 9.4. Continue the patient on Epogen.  4. Secondary hyperparathyroidism:   Recheck phosphorus with dialysis tomorrow.  5. LE edema Patient continues to have considerable lower extremity edema. It has been difficult to perform ultrafiltration given hemodynamic instability.  Palliative care has been following on the case.     LOS: 5 Annaleise Burger 4/3/20189:29 AM

## 2017-03-15 NOTE — Progress Notes (Signed)
[facility]  Daily Progress Note   Subjective  - 5 Days Post-Op  Confused.  Back on pressors  Objective Vitals:   03/15/17 1400 03/15/17 1500 03/15/17 1600 03/15/17 1700  BP: 96/77 98/77 (!) 89/56 100/81  Pulse: (!) 128 (!) 149 (!) 138 (!) 130  Resp: 20 19 16 16   Temp: 97.7 F (36.5 C)     TempSrc: Axillary     SpO2: 98% 96% 98% 93%  Weight:      Height:        Intake/Output Summary (Last 24 hours) at 03/15/17 1758 Last data filed at 03/15/17 1500  Gross per 24 hour  Intake            137.2 ml  Output              -29 ml  Net            166.2 ml   CV  tachycardic VASC  Stump C/D/I.  Minimal serous drainage  Laboratory CBC    Component Value Date/Time   WBC 9.2 03/15/2017 0534   HGB 9.4 (L) 03/15/2017 0534   HGB 8.6 (L) 03/10/2015 0811   HCT 28.4 (L) 03/15/2017 0534   HCT 28.7 (L) 08/14/2015 1519   PLT 153 03/15/2017 0534   PLT 183 03/10/2015 0426    BMET    Component Value Date/Time   NA 137 03/15/2017 0534   NA 130 (L) 03/10/2015 0426   K 3.5 03/15/2017 0534   K 5.3 (H) 03/10/2015 0811   CL 99 (L) 03/15/2017 0534   CL 91 (L) 03/10/2015 0426   CO2 28 03/15/2017 0534   CO2 26 03/10/2015 0426   GLUCOSE 97 03/15/2017 0534   GLUCOSE 85 03/10/2015 0426   BUN 15 03/15/2017 0534   BUN 41 (H) 03/10/2015 0426   CREATININE 2.17 (H) 03/15/2017 0534   CREATININE 6.65 (H) 03/10/2015 0426   CALCIUM 8.5 (L) 03/15/2017 0534   CALCIUM 7.6 (L) 03/10/2015 0426   GFRNONAA 20 (L) 03/15/2017 0534   GFRNONAA 5 (L) 03/10/2015 0426   GFRAA 23 (L) 03/15/2017 0534   GFRAA 6 (L) 03/10/2015 0426    Assessment/Planning: POD #6 s/p left BKA   Stump healing well  Cardiopulmonary issues as per IM service  No new recs from vascular at this point  RTC 3 weeks for suture and staple removal    Leotis Pain  03/15/2017, 5:58 PM

## 2017-03-15 NOTE — Progress Notes (Signed)
*   [facility]     Assessment and Plan:  IMPRESSION: ESRD Peripheral arterial disease S/P L BKA Chronic hypotension with systolics in the 73-428 range; now weaned off pressors. Chronic atrial fibrillation, rate adequately controlled currently, developed tachycardia overnight with HD, now resolved.  Elevated troponin I, currently unremarkable. Refractory shock- resolved.  DNR/DNI L effusion, possible LLL PNA Echo results; c/w with pulmonary hypertension, likely related to underlying medical comorbidities.   PLAN/REC:  Cont current abx.  Continue midodrine wean hydrocortisone, hold home dosing of propranolol when goes to floor.  HD per nephro.  DNR/DNI remains appropriate   Date: 03/15/2017  MRN# [id number] [name] [birth year]    Subjective  No new complaints today, patient is lethargic, arousable again today.   Medication:   Reviewed  Allergies:  Angiotensin receptor blockers and Penicillins  Review of Systems: Could not be obtained due to lethargy.   Physical Examination:   VS: BP (!) 93/58 (BP Location: Right Arm)   Pulse (!) 117   Temp 97.7 F (36.5 C) (Oral)   Resp 13   Ht 5\' 1"  (1.549 m)   Wt 220 lb 0.3 oz (99.8 kg)   SpO2 93%   BMI 41.57 kg/m   General Appearance: No distress  Neuro:without focal findings,  speech normal,  HEENT: PERRLA, EOM intact. Pulmonary: normal breath sounds, No wheezing.   CardiovascularNormal S1,S2.  No m/r/g.   Abdomen: Benign, Soft, non-tender. Renal:  No costovertebral tenderness  GU:  Not performed at this time. Endoc: No evident thyromegaly, no signs of acromegaly. Skin:   warm, no rash. Extremities: normal, no cyanosis, clubbing.   LABORATORY PANEL:   CBC  Recent Labs Lab 03/15/17 0534  WBC 9.2  HGB 9.4*  HCT 28.4*  PLT 153   ------------------------------------------------------------------------------------------------------------------  Chemistries   Recent Labs Lab  03/15/17 0534  NA 137  K 3.5  CL 99*  CO2 28  GLUCOSE 97  BUN 15  CREATININE 2.17*  CALCIUM 8.5*  MG 1.7   ------------------------------------------------------------------------------------------------------------------  Cardiac Enzymes  Recent Labs Lab 03/14/17 0442  TROPONINI 0.03*   ------------------------------------------------------------  RADIOLOGY:   No results found for this or any previous visit. Results for orders placed during the hospital encounter of 03/03/17  Chest 2 View   Narrative CLINICAL DATA:  Preop.  EXAM: CHEST  2 VIEW  COMPARISON:  09/26/2016  FINDINGS: Cardiomegaly. Low lung volumes with bibasilar atelectasis or infiltrates, left greater than right. No effusions. No overt edema. No acute bony abnormality.  IMPRESSION: Low lung volumes with bibasilar atelectasis or infiltrates, left greater than right.  Cardiomegaly.   Electronically Signed   By: Rolm Baptise M.D.   On: 03/03/2017 09:53    ------------------------------------------------------------------------------------------------------------------  Thank  you for allowing [facility] to assist in the care of your patient. Our recommendations are noted above.  Please contact us if we can be of further service.   Marda Stalker, MD.  [facility] Office Number: [phone]  Patricia Pesa, M.D.  Merton Border, M.D  03/15/2017

## 2017-03-16 DIAGNOSIS — Z7189 Other specified counseling: Secondary | ICD-10-CM

## 2017-03-16 DIAGNOSIS — I4891 Unspecified atrial fibrillation: Secondary | ICD-10-CM

## 2017-03-16 DIAGNOSIS — Z515 Encounter for palliative care: Secondary | ICD-10-CM

## 2017-03-16 DIAGNOSIS — Z89512 Acquired absence of left leg below knee: Secondary | ICD-10-CM

## 2017-03-16 LAB — GLUCOSE, CAPILLARY
GLUCOSE-CAPILLARY: 180 mg/dL — AB (ref 65–99)
GLUCOSE-CAPILLARY: 218 mg/dL — AB (ref 65–99)
Glucose-Capillary: 146 mg/dL — ABNORMAL HIGH (ref 65–99)
Glucose-Capillary: 197 mg/dL — ABNORMAL HIGH (ref 65–99)

## 2017-03-16 LAB — CBC
HEMATOCRIT: 29.1 % — AB (ref 35.0–47.0)
Hemoglobin: 9.5 g/dL — ABNORMAL LOW (ref 12.0–16.0)
MCH: 33.1 pg (ref 26.0–34.0)
MCHC: 32.8 g/dL (ref 32.0–36.0)
MCV: 101.2 fL — ABNORMAL HIGH (ref 80.0–100.0)
Platelets: 175 10*3/uL (ref 150–440)
RBC: 2.87 MIL/uL — ABNORMAL LOW (ref 3.80–5.20)
RDW: 17 % — AB (ref 11.5–14.5)
WBC: 9 10*3/uL (ref 3.6–11.0)

## 2017-03-16 LAB — BASIC METABOLIC PANEL
Anion gap: 10 (ref 5–15)
BUN: 17 mg/dL (ref 6–20)
CALCIUM: 8.3 mg/dL — AB (ref 8.9–10.3)
CO2: 25 mmol/L (ref 22–32)
CREATININE: 2.54 mg/dL — AB (ref 0.44–1.00)
Chloride: 103 mmol/L (ref 101–111)
GFR calc non Af Amer: 17 mL/min — ABNORMAL LOW (ref >60)
GFR, EST AFRICAN AMERICAN: 19 mL/min — AB (ref >60)
GLUCOSE: 156 mg/dL — AB (ref 65–99)
Potassium: 3.6 mmol/L (ref 3.5–5.1)
Sodium: 138 mmol/L (ref 135–145)

## 2017-03-16 LAB — TROPONIN I: Troponin I: 0.04 ng/mL (ref <0.03)

## 2017-03-16 MED ORDER — DILTIAZEM HCL 25 MG/5ML IV SOLN
10.0000 mg | Freq: Once | INTRAVENOUS | Status: AC
  Administered 2017-03-16: 10 mg via INTRAVENOUS
  Filled 2017-03-16: qty 5

## 2017-03-16 MED ORDER — AMIODARONE IV BOLUS ONLY 150 MG/100ML
150.0000 mg | Freq: Once | INTRAVENOUS | Status: AC
  Administered 2017-03-16: 150 mg via INTRAVENOUS
  Filled 2017-03-16: qty 100

## 2017-03-16 MED ORDER — PANTOPRAZOLE SODIUM 40 MG PO TBEC
40.0000 mg | DELAYED_RELEASE_TABLET | Freq: Every day | ORAL | Status: DC
  Administered 2017-03-18 – 2017-03-19 (×2): 40 mg via ORAL
  Filled 2017-03-16 (×2): qty 1

## 2017-03-16 MED ORDER — AMIODARONE HCL 200 MG PO TABS
200.0000 mg | ORAL_TABLET | Freq: Every day | ORAL | Status: DC
  Administered 2017-03-16: 200 mg via ORAL
  Filled 2017-03-16: qty 1

## 2017-03-16 NOTE — Progress Notes (Signed)
[facility] [place] NAME: [name]    MR#:  [id number]  DATE OF BIRTH:  February 12, 1934  SUBJECTIVE:  Patient Is showing significant decline in her status. Mental status very poor this morning. Patient is nothing by mouth currently due to mental status. Blood pressure is low. tachycardic Getting HD at bedside REVIEW OF SYSTEMS:    Review of Systems  Unable to perform ROS: Acuity of condition      Tolerating Diet: npo      DRUG ALLERGIES:   Allergies  Allergen Reactions  . Angiotensin Receptor Blockers Other (See Comments)    Reaction:  Hyperkalemia  . Penicillins Rash and Other (See Comments)    rash    VITALS:  Blood pressure 100/76, pulse (!) 131, temperature 98.3 F (36.8 C), temperature source Oral, resp. rate 20, height 5\' 1"  (1.549 m), weight 103.8 kg (228 lb 13.4 oz), SpO2 100 %.  PHYSICAL EXAMINATION:   Physical Exam  Constitutional: [name], well-nourished, and in no distress. No distress.  HENT:  Head: Normocephalic.  Eyes: No scleral icterus.  Neck: Neck supple. No JVD present. No tracheal deviation present.  Cardiovascular: Normal rate and normal heart sounds.  Exam reveals no gallop and no friction rub.   No murmur heard. irr, irr  Pulmonary/Chest: Effort normal and breath sounds normal. No respiratory distress. She has no wheezes. She has no rales. She exhibits no tenderness.  Abdominal: Soft. Bowel sounds are normal. She exhibits no distension and no mass. There is no tenderness. There is no rebound and no guarding.  Musculoskeletal: She exhibits edema.  Left BKA  Neurological:  Lethargic and not very responsive  Skin: Skin is warm. Rash noted. She is not diaphoretic. No erythema.  Psychiatric:  Lethargic   LABORATORY PANEL:   CBC  Recent Labs Lab 03/16/17 0435  WBC 9.0  HGB 9.5*  HCT 29.1*  PLT 175    ------------------------------------------------------------------------------------------------------------------  Chemistries   Recent Labs Lab 03/15/17 0534 03/16/17 0435  NA 137 138  K 3.5 3.6  CL 99* 103  CO2 28 25  GLUCOSE 97 156*  BUN 15 17  CREATININE 2.17* 2.54*  CALCIUM 8.5* 8.3*  MG 1.7  --    ------------------------------------------------------------------------------------------------------------------  Cardiac Enzymes  Recent Labs Lab 03/15/17 1241 03/15/17 1823 03/16/17 0435  TROPONINI 0.04* 0.04* 0.04*   ------------------------------------------------------------------------------------------------------------------  RADIOLOGY:  Dg Chest 1 View  Result Date: 03/15/2017 CLINICAL DATA:  83 y/o  F; dyspnea. EXAM: CHEST 1 VIEW COMPARISON:  03/13/2017 chest radiograph. FINDINGS: Stable right central venous catheter tip projecting over mid SVC. Stable vascular congestion. Stable moderate left-sided pleural effusion and left basilar opacity probably representing atelectasis. Multiple acute lateral rib fractures are again seen. Dense mitral annular calcifications. Stable cardiac silhouette given projection and technique. IMPRESSION: Stable vascular congestion, left pleural effusion, and left basilar opacity probably representing associated atelectasis. Multiple stable left-sided acute rib fractures again seen. Electronically Signed   By: Kristine Garbe M.D.   On: 03/15/2017 05:55   Ct Head Wo Contrast  Result Date: 03/15/2017 CLINICAL DATA:  Acute encephalopathy with progressive altered mental status over night. Nonresponsive currently. EXAM: CT HEAD WITHOUT CONTRAST TECHNIQUE: Contiguous axial images were obtained from the base of the skull through the vertex without intravenous contrast. COMPARISON:  03/05/2015. FINDINGS: Brain: Diffusely enlarged ventricles and subarachnoid spaces. Patchy white matter low density in both cerebral hemispheres. No  intracranial hemorrhage, mass lesion or CT evidence of acute infarction. Vascular:  No hyperdense vessel or unexpected calcification. Skull: Progressive right mastoid air cell opacification with resolved left mastoid air cell mucosal thickening. Sinuses/Orbits: Interval opacification of the right frontal and right anterior ethmoid sinuses. Left maxillary sinus mucosal thickening. Status post cataract removal on the left. Other: None. IMPRESSION: 1. No acute abnormality. 2. Mildly progressive atrophy and chronic small vessel white matter ischemic changes. 3. Chronic right frontal, right ethmoid and left maxillary sinusitis. 4. Progressive right mastoid air cell opacification. Electronically Signed   By: Claudie Revering M.D.   On: 03/15/2017 13:51     ASSESSMENT AND PLAN:  83 year old female with ESRD postop left below-knee amputation for left foot ulceration/osteomyelitis.   1. Acute encephalopathy due to ICU delirium and post op delirium/hypotension related? Patient has declined significantly  Palliative care is talking daily with family - not quite ready yet, daily meeting planned  2. Postoperative day #6 left below the knee amputation: Management as per surgery  3 Postop hypotension on baseline chronic hypotension: [name] unable to take in any oral medications due to mental status  Patient may need to start on pressors  Discussed with nursing  Continue hydrocortisone in the next few days  4 ESRD on hemodialysis as per nephrology No plans for dialysis today  5. A. fib with RVR,: Due to low BP BB stopped. Tachycardic in 130s at rest - Given bolus of amiodarone 150 mg IV push 1, followed by amiodarone by mouth daily per PCCM -Continue midodrine wean hydrocortisone, hold home dosing of propranolol for now due to continued hypotension.   6. Diabetes: Continue sliding scale.  Stop Lantus due to nothing by mouth status  7. RLE chronic nonhealing wounds: :Cleanse right leg with soap and water.  Aquacel Ag to wound bed. Cover with kerlix layer from below toes to below knee. Secure with Coban layer. Change three times weekly. Mon/Wed/Fri. D/w dr Juanell Fairly, Palliative care and patient's husband (he's still hopeful)   Patient has significantly declined in status. Palliative care consultation has been requested   CODE STATUS: DNR  crititcal care TOTAL TIME TAKING CARE OF THIS PATIENT: 15 minutes.     POSSIBLE D/C ?, DEPENDING ON CLINICAL CONDITION.   Max Sane M.D on 03/16/2017 at 6:27 PM  Between 7am to 6pm - Pager - [phone] After 6pm go to [email or web address] - password [email or web address] [facility]  Office  [phone]  CC: Primary care physician; Rusty Aus, MD  Note: This dictation was prepared with Dragon dictation along with smaller phrase technology. Any transcriptional errors that result from this process are unintentional.

## 2017-03-16 NOTE — Progress Notes (Signed)
Pre HD  

## 2017-03-16 NOTE — Consult Note (Signed)
Consultation Note Date: 03/16/2017   Patient Name: [name]  DOB: 1934-01-26  MRN: [id number]  Age / Sex: 83 y.o., female  PCP: Rusty Aus, MD Referring Physician: Max Sane, MD  Reason for Consultation: Establishing goals of care  HPI/Patient Profile: 83 y.o. female  with past medical history of ESRD on HD, HTN, DM, atrial fibrillation, OSA on CPAP HS, CHF, GERD, asthma, and anemia admitted on 03/10/2017 for a scheduled left below the knee amputation due to left foot ulceration/osteomyelitis. During procedure, patient went into afib RVR and became hypotensive requiring pressors. Also, ESRD on HD-not tolerating well due to hypotension and afib RVR. Patient with lethargy on 4/3. Remains critically ill on neo gtt.  Palliative medicine consultation for goals of care.   Clinical Assessment and Goals of Care: I have reviewed medical records, discussed with multiple members of the care team, and met with patient, husband, and son Shanon Brow) at bedside to discuss diagnosis, prognosis, GOC, EOL wishes, disposition and options. Patient is awake and alert this morning. She answers questions, follows commands, and participates in the conversation.   Introduced Palliative Medicine as specialized medical care for people living with serious illness. It focuses on providing relief from the symptoms and stress of a serious illness. The goal is to improve quality of life for both the patient and the family.  We discussed a brief life review of the patient. Mr. And Mrs. Aronov have been married for 20 years. They have 10 adult children all together. She has been on dialysis for about 8 years. She lives at home with husband who is her primary caregiver. She is bed/wheelchair bound but with good appetite prior to admission. Family speaks of her being a "very strong-willed woman." They are spiritual individuals and believe "this is  up to the man upstairs."   Discussed current hospitalization diagnoses and interventions. Shanon Brow adamantly states "We don't need hospice. We can care for her ourselves and continue dialysis." Explained my concern with her not tolerating dialysis and requiring continuous medication to increase blood pressure. Also, high risk for decompensation with her low blood pressure and high heart rate--dialysis becoming more harm than good to her. Also, not being at a point where she can leave the hospital.   Discussed code status. Family confirms DNR/DNI which I agree with.   Attempted to ask the patient what it is she wants for herself. She states "I feel better after dialysis."   Encouraged Mr. Pickeral and Shanon Brow to consider the two options they are faced with--high risk for decompensation with continued dialysis/cardiac arrest at the end of her life versus home with hospice and focus on comfort and quality time with her family at the end of her life. Husband seems realistic with wanting her home and comfortable when she dies. Shanon Brow speaks of the importance of gathering all of the adult children so everyone understands and can help Mr. Gagliano make this decision. Some family is in [facility] and will likely not be here till the weekend. Mr. Lesko  and Shanon Brow have my contact information. Strongly encouraged they arrange a time to gather family to meet with me in the next two days.   Questions and concerns were addressed.  Hard Choices booklet left for review. Patient asking to eat. Tolerating ice chips when I was at bedside. I have placed a diet.     SUMMARY OF RECOMMENDATIONS    DNR/DNI  Patient awake and alert today. Family requests to continue dialysis. Discussed with Dr. Holley Raring and dialysis will be attempted today.   Strongly encouraged husband and son to gather family and meet with me to discuss [place] and options. They have my contact information.   PMT will continue to support patient and family through  hospitalization.    Code Status/Advance Care Planning:  DNR   Symptom Management:   Per attending  Palliative Prophylaxis:   Aspiration, Delirium Protocol, Frequent Pain Assessment, Oral Care and Turn Reposition  Psycho-social/Spiritual:   Desire for further Chaplaincy support: yes  Additional Recommendations: Caregiving  Support/Resources, Compassionate Wean Education and Education on Hospice  Prognosis:   Unable to determine-poor prognosis with ESRD not tolerating HD, afib RVR, and hypotension  Discharge Planning: To Be Determined      Primary Diagnoses: Present on Admission: . Osteomyelitis of ankle or foot, left, acute ([place]) . Osteomyelitis of ankle or foot, acute, left ([place])   I have reviewed the medical record, interviewed the patient and family, and examined the patient. The following aspects are pertinent.  Past Medical History:  Diagnosis Date  . A-fib (HCC)    not on anticoagulation  . Anemia   . Asthma   . Bilateral lower extremity edema   . CHF (congestive heart failure) ([place])   . Diabetes mellitus without complication [facility])    Patient takes Insulin  . Dysrhythmia   . ESRD (end stage renal disease) ([place])    Monday, wednesday, Friday DIalysis  . Essential tremor   . GERD (gastroesophageal reflux disease)   . HTN (hypertension)   . OSA (obstructive sleep apnea)   . Osteoarthritis   . Renal insufficiency    Patient is on dialysis and normal days are M,W and F.  . Skin cancer    Resected from legs   Social History   Social History  . Marital status: Married    Spouse name: N/A  . Number of children: N/A  . Years of education: N/A   Social History Main Topics  . Smoking status: Never Smoker  . Smokeless tobacco: Never Used  . Alcohol use No  . Drug use: No  . Sexual activity: Not Asked   Other Topics Concern  . None   Social History Narrative  . None   Family History  Problem Relation Age of Onset  . Diabetes type II Father     . Breast cancer Sister 42  . Breast cancer Maternal Grandmother    Scheduled Meds: . albumin human  12.5 g Intravenous Once in dialysis  . ceFEPIme  1 g Intravenous Q24H  . docusate sodium  100 mg Oral BID  . epoetin (EPOGEN/PROCRIT) injection  4,000 Units Intravenous Q M,W,F-HD  . feeding supplement (NEPRO CARB STEADY)  237 mL Oral BID BM  . heparin  5,000 Units Subcutaneous Q8H  . hydrocortisone sod succinate (SOLU-CORTEF) inj  25 mg Intravenous Q12H  . insulin aspart  0-9 Units Subcutaneous TID WC  . mouth rinse  15 mL Mouth Rinse BID  . megestrol  40 mg Oral Daily  . midodrine  10 mg Oral TID  . multivitamin  1 tablet Oral Daily  . pantoprazole (PROTONIX) IV  40 mg Intravenous Q24H   Continuous Infusions: . phenylephrine (NEO-SYNEPHRINE) Adult infusion 30 mcg/min (03/16/17 0645)   PRN Meds:.acetaminophen **OR** acetaminophen, alum & mag hydroxide-simeth, fentaNYL (SUBLIMAZE) injection, guaiFENesin-dextromethorphan, ipratropium-albuterol, magnesium sulfate 1 - 4 g bolus IVPB, metoprolol, morphine injection, ondansetron, oxyCODONE-acetaminophen, phenol, polyethylene glycol, promethazine, senna, sorbitol Medications Prior to Admission:  Prior to Admission medications   Medication Sig Start Date End Date Taking? Authorizing Provider  allopurinol (ZYLOPRIM) 100 MG tablet Take 100 mg by mouth daily.    Yes Historical Provider, MD  calcium acetate (PHOSLO) 667 MG capsule Take 1 capsule (667 mg total) by mouth 2 (two) times daily between meals. 04/10/16  Yes Vaughan Basta, MD  cholecalciferol (VITAMIN D) 1000 units tablet Take 1,000 Units by mouth daily.   Yes Historical Provider, MD  docusate sodium (COLACE) 100 MG capsule Take 1 capsule (100 mg total) by mouth 2 (two) times daily. 04/10/16  Yes Vaughan Basta, MD  fluticasone (FLONASE) 50 MCG/ACT nasal spray Place 2 sprays into both nostrils at bedtime.   Yes Historical Provider, MD  folic acid-vitamin b complex-vitamin  c-selenium-zinc (DIALYVITE) 3 MG TABS tablet Take 1 tablet by mouth daily.   Yes Historical Provider, MD  HYDROcodone-acetaminophen (NORCO/VICODIN) 5-325 MG tablet Take 1 tablet by mouth 2 (two) times daily as needed. 11/29/16  Yes Historical Provider, MD  insulin glargine (LANTUS) 100 UNIT/ML injection Inject 0.08 mLs (8 Units total) into the skin at bedtime. Patient taking differently: Inject 10 Units into the skin at bedtime.  09/21/16  Yes Gladstone Lighter, MD  ipratropium-albuterol (DUONEB) 0.5-2.5 (3) MG/3ML SOLN Take 3 mLs by nebulization 4 (four) times daily as needed (for wheezing/shortness of breath).   Yes Historical Provider, MD  lidocaine-prilocaine (EMLA) cream Apply 1 application topically as needed. Apply to access site (left forearm) 1 hour prior to dialysis and cover with clear wrap. Mon, wed, fri 0500   Yes Historical Provider, MD  megestrol (MEGACE) 40 MG tablet Take 40 mg by mouth daily.   Yes Historical Provider, MD  midodrine (PROAMATINE) 10 MG tablet Take 1 tablet (10 mg total) by mouth 3 (three) times daily with meals. Patient taking differently: Take 10 mg by mouth 3 (three) times daily with meals. [place] Re[place], thu, sat - 0900,1800,2100 09/21/16  Yes Gladstone Lighter, MD  pantoprazole (PROTONIX) 40 MG tablet Take 40 mg by mouth daily.   Yes Historical Provider, MD  promethazine (PHENERGAN) 25 MG tablet Take 25 mg by mouth every 6 (six) hours as needed for nausea or vomiting.    Yes Historical Provider, MD  propranolol (INDERAL) 80 MG tablet Take 40 mg by mouth daily. 01/18/17  Yes Historical Provider, MD  RENVELA 800 MG tablet Take 800-1,600 mg by mouth 5 (five) times daily. Pt takes two tablets three times daily with meals and one tablet two times daily with snacks. 06/30/15  Yes Historical Provider, MD  senna (SENOKOT) 8.6 MG TABS tablet Take 2 tablets (17.2 mg total) by mouth daily as needed for mild constipation. 04/10/16  Yes Vaughan Basta, MD  temazepam (RESTORIL) 15  MG capsule Take 15 mg by mouth at bedtime as needed for sleep.   Yes Historical Provider, MD  traMADol (ULTRAM) 50 MG tablet Take 1 tablet (50 mg total) by mouth every 6 (six) hours as needed for moderate pain or severe pain. 09/21/16  Yes Gladstone Lighter, MD   Allergies  Allergen  Reactions  . Angiotensin Receptor Blockers Other (See Comments)    Reaction:  Hyperkalemia  . Penicillins Rash and Other (See Comments)    rash   Review of Systems  Unable to perform ROS: Acuity of condition   Physical Exam  Constitutional: She is cooperative.  HENT:  Head: Normocephalic and atraumatic.  Cardiovascular: An irregularly irregular rhythm present.  afib RVR  Pulmonary/Chest: No accessory muscle usage. No tachypnea. No respiratory distress. She has decreased breath sounds.  Abdominal: Normal appearance and bowel sounds are normal. There is no tenderness.  Musculoskeletal: She exhibits edema (generalized).  Left BKA  Feet:  Left Foot: amputated Neurological: She is alert.  Pleasantly confused  Skin: Skin is warm and dry. There is pallor.  Psychiatric: She has a normal mood and affect. Her behavior is normal. Her speech is delayed.  Nursing note and vitals reviewed.  Vital Signs: BP (!) 93/59   Pulse (!) 136   Temp 98.4 F (36.9 C) (Oral)   Resp (!) 8   Ht _0  (1.549 m)   Wt 103.8 kg (228 lb 13.4 oz)   SpO2 98%   BMI 43.24 kg/m  Pain Assessment: CPOT   Pain Score: Asleep  SpO2: SpO2: 98 % O2 Device:SpO2: 98 % O2 Flow Rate: .O2 Flow Rate (L/min): 2 L/min  IO: Intake/output summary:   Intake/Output Summary (Last 24 hours) at 03/16/17 0919 Last data filed at 03/16/17 0600  Gross per 24 hour  Intake            587.2 ml  Output                0 ml  Net            587.2 ml    LBM: Last BM Date:  (unknown ) Baseline Weight: Weight: 106.1 kg (234 lb) Most recent weight: Weight: 103.8 kg (228 lb 13.4 oz)     Palliative Assessment/Data: PPS 30%   Flowsheet Rows     Most  Recent Value  Intake Tab  Referral Department  Critical care  Unit at Time of Referral  ICU  Palliative Care Primary Diagnosis  Nephrology  Date Notified  03/15/17  Palliative Care Type  Return patient Palliative Care  Reason for referral  Clarify Goals of Care  Date of Admission  03/10/17  Date first seen by Palliative Care  03/16/17  # of days Palliative referral response time  1 Day(s)  # of days IP prior to Palliative referral  5  Clinical Assessment  Palliative Performance Scale Score  30%  Psychosocial & Spiritual Assessment  Palliative Care Outcomes  Patient/Family meeting held?  Yes  Who was at the meeting?  patient, husband, son  Palliative Care Outcomes  Clarified goals of care, Provided psychosocial or spiritual support, Provided end of life care assistance, Counseled regarding hospice, ACP counseling assistance      Time In: 0800 Time Out: 0915 Time Total: 65mn Greater than 50%  of this time was spent counseling and coordinating care related to the above assessment and plan.  Signed by:  MIhor Dow FNP-C Palliative Medicine Team  Phone: [phone]: [phone]  Please contact Palliative Medicine Team phone at [phone] questions and concerns.  For individual provider: See AShea Evans

## 2017-03-16 NOTE — Progress Notes (Signed)
CONCERNING: IV to Oral Route Change Policy  RECOMMENDATION: This patient is receiving pantoprazole by the intravenous route.  Based on criteria approved by the Pharmacy and Therapeutics Committee, the intravenous medication(s) is/are being converted to the equivalent oral dose form(s).   DESCRIPTION: These criteria include:  The patient is eating (either orally or via tube) and/or has been taking other orally administered medications for a least 24 hours  The patient has no evidence of active gastrointestinal bleeding or impaired GI absorption (gastrectomy, short bowel, patient on TNA or NPO).  If you have questions about this conversion, please contact the Pharmacy Department  []   [phone] )  [facility] [x]   [phone] )  [facility] []   [phone] )  [facility] []   [phone] )  [facility] []   [phone] )  [place], [facility] 03/16/2017 7:11 PM

## 2017-03-16 NOTE — Progress Notes (Signed)
PT Cancellation Note  Patient Details Name: [name] MRN: [id number] DOB: [birth year]   Cancelled Treatment:    Reason Eval/Treat Not Completed: Other (comment). Treatment attempted; family in the room trying to feed pt. Family wishes pt to try and eat before going to dialysis. Pt more alert, but continues confused when therapist speaks with pt. Family considering palliative care options. Re attempt as appropriate at a later time/date.    Larae Grooms, PTA 03/16/2017, 12:22 PM

## 2017-03-16 NOTE — Progress Notes (Signed)
[facility]  Daily Progress Note   Subjective  - 6 Days Post-Op  More sleepy today.  Remains confused.  Still on pressors  Objective Vitals:   03/16/17 0423 03/16/17 0500 03/16/17 0600 03/16/17 0800  BP:  (!) 89/57 (!) 93/59   Pulse:  (!) 141 (!) 136   Resp:  14 (!) 8   Temp:    98.4 F (36.9 C)  TempSrc:    Oral  SpO2:  96% 98%   Weight: 103.8 kg (228 lb 13.4 oz)     Height:        Intake/Output Summary (Last 24 hours) at 03/16/17 1154 Last data filed at 03/16/17 1138  Gross per 24 hour  Intake          1250.95 ml  Output                0 ml  Net          1250.95 ml    PULM  Diminished bilaterally CV  Tachycardic and irregular VASC  Stump is C/D/I  Laboratory CBC    Component Value Date/Time   WBC 9.0 03/16/2017 0435   HGB 9.5 (L) 03/16/2017 0435   HGB 8.6 (L) 03/10/2015 0811   HCT 29.1 (L) 03/16/2017 0435   HCT 28.7 (L) 08/14/2015 1519   PLT 175 03/16/2017 0435   PLT 183 03/10/2015 0426    BMET    Component Value Date/Time   NA 138 03/16/2017 0435   NA 130 (L) 03/10/2015 0426   K 3.6 03/16/2017 0435   K 5.3 (H) 03/10/2015 0811   CL 103 03/16/2017 0435   CL 91 (L) 03/10/2015 0426   CO2 25 03/16/2017 0435   CO2 26 03/10/2015 0426   GLUCOSE 156 (H) 03/16/2017 0435   GLUCOSE 85 03/10/2015 0426   BUN 17 03/16/2017 0435   BUN 41 (H) 03/10/2015 0426   CREATININE 2.54 (H) 03/16/2017 0435   CREATININE 6.65 (H) 03/10/2015 0426   CALCIUM 8.3 (L) 03/16/2017 0435   CALCIUM 7.6 (L) 03/10/2015 0426   GFRNONAA 17 (L) 03/16/2017 0435   GFRNONAA 5 (L) 03/10/2015 0426   GFRAA 19 (L) 03/16/2017 0435   GFRAA 6 (L) 03/10/2015 0426    Assessment/Planning: POD #7 s/p left BKA   Cardiopulmonary status is really not improving.  Has not really tolerated dialysis.  Remains tachycardic and hypotensive despite pressors.  Critical care service internal medicine service had discussed the difficult situation with the family, and they are  considering palliative options at this point.  No new vascular surgery recommendations at this time    Leotis Pain  03/16/2017, 11:54 AM

## 2017-03-16 NOTE — Progress Notes (Signed)
HD initiated without issue  

## 2017-03-16 NOTE — Progress Notes (Signed)
OT Cancellation Note  Patient Details Name: [name] MRN: [id number] DOB: October 05, 1934   Cancelled Treatment:    Reason Eval/Treat Not Completed: Patient at procedure or test/ unavailable Pt receiving hemodialysis, unavailable for OT treatment at this time. Will continue to monitor and re-attempt OT treatment at later date/time as appropriate.   Jeni Salles, MPH, MS, OTR/L ascom [phone] 03/16/17, 2:36 PM

## 2017-03-16 NOTE — Progress Notes (Signed)
Subjective:  Patient much more awake and alert this a.m.  She recognizes that she was at [facility]. She also recognized her husband and her son. Out of care is meeting with the family today as well.   Objective:  Vital signs in last 24 hours:  Temp:  [97.7 F (36.5 C)-98.4 F (36.9 C)] 98.4 F (36.9 C) (04/04 0800) Pulse Rate:  [109-156] 136 (04/04 0600) Resp:  [0-23] 8 (04/04 0600) BP: (77-107)/(45-81) 93/59 (04/04 0600) SpO2:  [93 %-100 %] 98 % (04/04 0600) FiO2 (%):  [99 %] 99 % (04/03 2313) Weight:  [103.8 kg (228 lb 13.4 oz)] 103.8 kg (228 lb 13.4 oz) (04/04 0423)  Weight change: 4 kg (8 lb 13.1 oz) Filed Weights   03/11/17 1345 03/14/17 1528 03/16/17 0423  Weight: 97 kg (213 lb 13.5 oz) 99.8 kg (220 lb 0.3 oz) 103.8 kg (228 lb 13.4 oz)    Intake/Output:    Intake/Output Summary (Last 24 hours) at 03/16/17 0926 Last data filed at 03/16/17 0600  Gross per 24 hour  Intake            587.2 ml  Output                0 ml  Net            587.2 ml     Physical Exam: General: Laying in bed, lethargic  HEENT Moist oral mucus membranes  Neck supple  Pulm/lungs Normal effort, clear to auscultation  CVS/Heart Irregular, A Fib, tachycardic  Abdomen:  Soft, NTND  Extremities: Left BKA, Rt foot in bandage  Neurologic: Awake, alert, follows commands  Access: Left forearm AVF          Basic Metabolic Panel:   Recent Labs Lab 03/12/17 0502 03/13/17 0505 03/14/17 0442 03/15/17 0534 03/16/17 0435  NA 136 133* 134* 137 138  K 4.6 3.8 3.9 3.5 3.6  CL 100* 99* 101 99* 103  CO2 24 25 24 28 25   GLUCOSE 111* 143* 124* 97 156*  BUN 12 17 23* 15 17  CREATININE 2.24* 2.56* 2.96* 2.17* 2.54*  CALCIUM 8.6* 8.4* 8.6* 8.5* 8.3*  MG  --   --   --  1.7  --   PHOS  --  3.2  --   --   --      CBC:  Recent Labs Lab 03/11/17 0613 03/12/17 0628 03/13/17 0505 03/15/17 0534 03/16/17 0435  WBC 13.7* 13.3* 10.1 9.2 9.0  HGB 11.5* 10.1* 9.3* 9.4* 9.5*  HCT  35.1 30.8* 28.5* 28.4* 29.1*  MCV 102.2* 101.5* 101.7* 102.2* 101.2*  PLT 192 219 226 153 175      Microbiology:  Recent Results (from the past 720 hour(s))  Aerobic Culture (superficial specimen)     Status: None   Collection Time: 02/22/17  3:50 PM  Result Value Ref Range Status   Specimen Description FOOT LEFT  Final   Special Requests CALCANEUS  Final   Gram Stain   Final    RARE WBC PRESENT, PREDOMINANTLY PMN ABUNDANT GRAM POSITIVE COCCI IN PAIRS ABUNDANT GRAM NEGATIVE COCCOBACILLI FEW YEAST Performed at [facility], [address], [place], [place] [address]    Culture   Final    ABUNDANT KLEBSIELLA PNEUMONIAE ABUNDANT ENTEROCOCCUS FAECALIS    Report Status 02/27/2017 FINAL  Final   Organism ID, Bacteria KLEBSIELLA PNEUMONIAE  Final   Organism ID, Bacteria ENTEROCOCCUS FAECALIS  Final      Susceptibility  Enterococcus faecalis - MIC*    AMPICILLIN <=2 SENSITIVE Sensitive     VANCOMYCIN 1 SENSITIVE Sensitive     GENTAMICIN SYNERGY RESISTANT Resistant     * ABUNDANT ENTEROCOCCUS FAECALIS   Klebsiella pneumoniae - MIC*    AMPICILLIN >=32 RESISTANT Resistant     CEFAZOLIN <=4 SENSITIVE Sensitive     CEFEPIME <=1 SENSITIVE Sensitive     CEFTAZIDIME <=1 SENSITIVE Sensitive     CEFTRIAXONE <=1 SENSITIVE Sensitive     CIPROFLOXACIN <=0.25 SENSITIVE Sensitive     GENTAMICIN <=1 SENSITIVE Sensitive     IMIPENEM <=0.25 SENSITIVE Sensitive     TRIMETH/SULFA <=20 SENSITIVE Sensitive     AMPICILLIN/SULBACTAM 4 SENSITIVE Sensitive     PIP/TAZO <=4 SENSITIVE Sensitive     Extended ESBL NEGATIVE Sensitive     * ABUNDANT KLEBSIELLA PNEUMONIAE  Surgical pcr screen     Status: Abnormal   Collection Time: 03/03/17  8:45 AM  Result Value Ref Range Status   MRSA, PCR POSITIVE (A) NEGATIVE Final    Comment: RESULT CALLED TO, READ BACK BY AND VERIFIED WITH: GAIL GRAVES 03/03/17 1140 SGD    Staphylococcus aureus POSITIVE (A) NEGATIVE Final    Comment:        The Xpert SA  Assay (FDA approved for NASAL specimens in patients over 64 years of age), is one component of a comprehensive surveillance program.  Test performance has been validated by [facility] for patients greater than or equal to 81 year old. It is not intended to diagnose infection nor to guide or monitor treatment.     Coagulation Studies: No results for input(s): LABPROT, INR in the last 72 hours.  Urinalysis: No results for input(s): COLORURINE, LABSPEC, PHURINE, GLUCOSEU, HGBUR, BILIRUBINUR, KETONESUR, PROTEINUR, UROBILINOGEN, NITRITE, LEUKOCYTESUR in the last 72 hours.  Invalid input(s): APPERANCEUR    Imaging: Dg Chest 1 View  Result Date: 03/15/2017 CLINICAL DATA:  83 y/o  F; dyspnea. EXAM: CHEST 1 VIEW COMPARISON:  03/13/2017 chest radiograph. FINDINGS: Stable right central venous catheter tip projecting over mid SVC. Stable vascular congestion. Stable moderate left-sided pleural effusion and left basilar opacity probably representing atelectasis. Multiple acute lateral rib fractures are again seen. Dense mitral annular calcifications. Stable cardiac silhouette given projection and technique. IMPRESSION: Stable vascular congestion, left pleural effusion, and left basilar opacity probably representing associated atelectasis. Multiple stable left-sided acute rib fractures again seen. Electronically Signed   By: Kristine Garbe M.D.   On: 03/15/2017 05:55   Ct Head Wo Contrast  Result Date: 03/15/2017 CLINICAL DATA:  Acute encephalopathy with progressive altered mental status over night. Nonresponsive currently. EXAM: CT HEAD WITHOUT CONTRAST TECHNIQUE: Contiguous axial images were obtained from the base of the skull through the vertex without intravenous contrast. COMPARISON:  03/05/2015. FINDINGS: Brain: Diffusely enlarged ventricles and subarachnoid spaces. Patchy white matter low density in both cerebral hemispheres. No intracranial hemorrhage, mass lesion or CT evidence of  acute infarction. Vascular: No hyperdense vessel or unexpected calcification. Skull: Progressive right mastoid air cell opacification with resolved left mastoid air cell mucosal thickening. Sinuses/Orbits: Interval opacification of the right frontal and right anterior ethmoid sinuses. Left maxillary sinus mucosal thickening. Status post cataract removal on the left. Other: None. IMPRESSION: 1. No acute abnormality. 2. Mildly progressive atrophy and chronic small vessel white matter ischemic changes. 3. Chronic right frontal, right ethmoid and left maxillary sinusitis. 4. Progressive right mastoid air cell opacification. Electronically Signed   By: Claudie Revering M.D.   On: 03/15/2017 13:51  Medications:   . phenylephrine (NEO-SYNEPHRINE) Adult infusion 30 mcg/min (03/16/17 0645)   . albumin human  12.5 g Intravenous Once in dialysis  . ceFEPIme  1 g Intravenous Q24H  . docusate sodium  100 mg Oral BID  . epoetin (EPOGEN/PROCRIT) injection  4,000 Units Intravenous Q M,W,F-HD  . feeding supplement (NEPRO CARB STEADY)  237 mL Oral BID BM  . heparin  5,000 Units Subcutaneous Q8H  . hydrocortisone sod succinate (SOLU-CORTEF) inj  25 mg Intravenous Q12H  . insulin aspart  0-9 Units Subcutaneous TID WC  . mouth rinse  15 mL Mouth Rinse BID  . megestrol  40 mg Oral Daily  . midodrine  10 mg Oral TID  . multivitamin  1 tablet Oral Daily  . pantoprazole (PROTONIX) IV  40 mg Intravenous Q24H   acetaminophen **OR** acetaminophen, alum & mag hydroxide-simeth, fentaNYL (SUBLIMAZE) injection, guaiFENesin-dextromethorphan, ipratropium-albuterol, magnesium sulfate 1 - 4 g bolus IVPB, metoprolol, morphine injection, ondansetron, oxyCODONE-acetaminophen, phenol, polyethylene glycol, promethazine, senna, sorbitol  Assessment/ Plan:  83 y.o. female   with end-stage renal disease on hemodialysis, diabetes mellitus type 2, hypertension, obstructive sleep apnea, atrial fibrillation, arthritis, anemia, distal right  femoral fracture, admitted on 09/26/2016 for Leg edema [R60.0] Venous thrombosis [I82.90] ,rt leg DVT  MWF [facility]  1. End-stage renal disease-  MWF - Patient did not tolerate her last hemodialysis session very well. Therefore we will plan for another session today. However we continue have reservations about her ability to tolerate dialysis. She remains on phenylephrine.  2.  Hypotension:  - Continue phenylephrine to maintain a map of 65 or greater. Patient has been difficult to wean from phenylephrine. Patient has chronic hypotension.   3. Anemia of chronic kidney disease - Hemoglobin 9.5 today. Continue Epogen with hemodialysis.  4. Secondary hyperparathyroidism:   We plan to repeat serum phosphorus with dialysis today.  5. LE edema Overall stable. We have been unsuccessful in performing significant ultrafiltration.    LOS: 6 Camden Mazzaferro 4/4/20189:26 AM

## 2017-03-16 NOTE — Progress Notes (Signed)
Post HD assessment unchanged  

## 2017-03-16 NOTE — Progress Notes (Signed)
HD completed without issue. Patient tolerated well. 0.5L goal met. Report given to primary RN. Hemostasis achieved after 7 minutes each site. Dressings clean dry and intact. Family at bedside

## 2017-03-16 NOTE — Progress Notes (Signed)
* [facility]     Assessment and Plan:  IMPRESSION: ESRD Peripheral arterial disease S/P L BKA Chronic hypotension with systolics in the 47-829 range; continues on phenylephrine.  Chronic atrial fibrillation currently in RVR.  DNR/DNI Echo results; c/w with pulmonary hypertension, likely related to underlying medical comorbidities.   PLAN/REC: Discussed plan of care with nephrology and palliative care, met with family explained poor prognosis, they are going to continue discussion with palliative care.  --Given bolus of amiodarone 150 mg IV push 1, followed by amiodarone by mouth daily -Cont current abx, empirically for possible septic shock. -Continue midodrine wean hydrocortisone, hold home dosing of propranolol for now due to continued hypotension.  HD per nephro.  DNR/DNI remains appropriate   Date: 03/16/2017  MRN# [id number] [name] 1934/01/05    Subjective  No new complaints today, patient is lethargic, arousable again today.   Medication:   Reviewed  Allergies:  Angiotensin receptor blockers and Penicillins  Review of Systems: Could not be obtained due to lethargy.   Physical Examination:   VS: BP (!) 93/59   Pulse (!) 136   Temp 98.4 F (36.9 C) (Oral)   Resp (!) 8   Ht 5' 1"  (1.549 m)   Wt 228 lb 13.4 oz (103.8 kg)   SpO2 98%   BMI 43.24 kg/m   General Appearance: No distress . Patient appears lethargic. Neuro:without focal findings,  speech slurred, HEENT: PERRLA, EOM intact. Pulmonary: Decreased air entry bilaterally No wheezing.   CardiovascularNormal S1,S2.  No m/r/g.   Abdomen: Benign, Soft, non-tender. Renal:  No costovertebral tenderness  GU:  Not performed at this time. Endoc: No evident thyromegaly, no signs of acromegaly. Skin:   warm, no rash. Extremities: normal, no cyanosis, clubbing.   LABORATORY PANEL:   CBC  Recent Labs Lab 03/16/17 0435  WBC 9.0  HGB 9.5*  HCT 29.1*  PLT 175    ------------------------------------------------------------------------------------------------------------------  Chemistries   Recent Labs Lab 03/15/17 0534 03/16/17 0435  NA 137 138  K 3.5 3.6  CL 99* 103  CO2 28 25  GLUCOSE 97 156*  BUN 15 17  CREATININE 2.17* 2.54*  CALCIUM 8.5* 8.3*  MG 1.7  --    ------------------------------------------------------------------------------------------------------------------  Cardiac Enzymes  Recent Labs Lab 03/16/17 0435  TROPONINI 0.04*   ------------------------------------------------------------  RADIOLOGY:   No results found for this or any previous visit. Results for orders placed during the hospital encounter of 03/03/17  Chest 2 View   Narrative CLINICAL DATA:  Preop.  EXAM: CHEST  2 VIEW  COMPARISON:  09/26/2016  FINDINGS: Cardiomegaly. Low lung volumes with bibasilar atelectasis or infiltrates, left greater than right. No effusions. No overt edema. No acute bony abnormality.  IMPRESSION: Low lung volumes with bibasilar atelectasis or infiltrates, left greater than right.  Cardiomegaly.   Electronically Signed   By: Rolm Baptise M.D.   On: 03/03/2017 09:53    ------------------------------------------------------------------------------------------------------------------  Thank  you for allowing [facility] to assist in the care of your patient. Our recommendations are noted above.  Please contact us if we can be of further service.   Marda Stalker, MD.  [facility] Office Number: [phone]  Patricia Pesa, M.D.  Merton Border, M.D  03/16/2017   Critical Care Attestation.  I have personally obtained a history, examined the patient, evaluated laboratory and imaging results, formulated the assessment and plan and placed orders. The Patient requires high complexity decision making for assessment  and support, frequent evaluation and  titration of therapies, application of advanced monitoring technologies and extensive interpretation of multiple databases. The patient has critical illness that could lead imminently to failure of 1 or more organ systems and requires the highest level of physician preparedness to intervene.  Critical Care Time devoted to patient care services described in this note is 30 minutes and is exclusive of time spent in procedures.

## 2017-03-17 DIAGNOSIS — I481 Persistent atrial fibrillation: Secondary | ICD-10-CM

## 2017-03-17 DIAGNOSIS — I4819 Other persistent atrial fibrillation: Secondary | ICD-10-CM

## 2017-03-17 LAB — BASIC METABOLIC PANEL
ANION GAP: 8 (ref 5–15)
BUN: 14 mg/dL (ref 6–20)
CHLORIDE: 100 mmol/L — AB (ref 101–111)
CO2: 28 mmol/L (ref 22–32)
Calcium: 8.4 mg/dL — ABNORMAL LOW (ref 8.9–10.3)
Creatinine, Ser: 2.21 mg/dL — ABNORMAL HIGH (ref 0.44–1.00)
GFR calc non Af Amer: 20 mL/min — ABNORMAL LOW (ref >60)
GFR, EST AFRICAN AMERICAN: 23 mL/min — AB (ref >60)
Glucose, Bld: 235 mg/dL — ABNORMAL HIGH (ref 65–99)
Potassium: 3.5 mmol/L (ref 3.5–5.1)
SODIUM: 136 mmol/L (ref 135–145)

## 2017-03-17 LAB — GLUCOSE, CAPILLARY
GLUCOSE-CAPILLARY: 198 mg/dL — AB (ref 65–99)
Glucose-Capillary: 233 mg/dL — ABNORMAL HIGH (ref 65–99)
Glucose-Capillary: 265 mg/dL — ABNORMAL HIGH (ref 65–99)
Glucose-Capillary: 195 mg/dL — ABNORMAL HIGH (ref 65–99)

## 2017-03-17 LAB — CBC
HCT: 29 % — ABNORMAL LOW (ref 35.0–47.0)
HEMOGLOBIN: 9.5 g/dL — AB (ref 12.0–16.0)
MCH: 33.4 pg (ref 26.0–34.0)
MCHC: 32.9 g/dL (ref 32.0–36.0)
MCV: 101.5 fL — ABNORMAL HIGH (ref 80.0–100.0)
Platelets: 143 10*3/uL — ABNORMAL LOW (ref 150–440)
RBC: 2.85 MIL/uL — AB (ref 3.80–5.20)
RDW: 17.8 % — ABNORMAL HIGH (ref 11.5–14.5)
WBC: 9 10*3/uL (ref 3.6–11.0)

## 2017-03-17 MED ORDER — AMIODARONE HCL IN DEXTROSE 360-4.14 MG/200ML-% IV SOLN
60.0000 mg/h | INTRAVENOUS | Status: DC
  Administered 2017-03-17: 60 mg/h via INTRAVENOUS
  Filled 2017-03-17 (×2): qty 200

## 2017-03-17 MED ORDER — OXYCODONE-ACETAMINOPHEN 5-325 MG PO TABS: 1.0000 | ORAL_TABLET | ORAL | Status: DC | PRN

## 2017-03-17 MED ORDER — AMIODARONE HCL IN DEXTROSE 360-4.14 MG/200ML-% IV SOLN
30.0000 mg/h | INTRAVENOUS | Status: DC
  Administered 2017-03-18 – 2017-03-19 (×3): 30 mg/h via INTRAVENOUS
  Filled 2017-03-17 (×3): qty 200

## 2017-03-17 MED ORDER — MORPHINE SULFATE (PF) 4 MG/ML IV SOLN
2.0000 mg | INTRAVENOUS | Status: DC | PRN
  Administered 2017-03-17 – 2017-03-20 (×8): 2 mg via INTRAVENOUS
  Filled 2017-03-17 (×8): qty 1

## 2017-03-17 MED ORDER — AMIODARONE LOAD VIA INFUSION
150.0000 mg | Freq: Once | INTRAVENOUS | Status: AC
  Administered 2017-03-17: 150 mg via INTRAVENOUS
  Filled 2017-03-17: qty 83.34

## 2017-03-17 NOTE — Clinical Social Work Note (Signed)
CSW received consult that patient may need hospice placement based on decision the family decides to make.  CSW to continue to follow patient's progress throughout discharge planning.  Jones Broom. [place], MSW, [place]  03/17/2017 2:10 PM

## 2017-03-17 NOTE — Progress Notes (Addendum)
[name]    MR#:  [id number]  DATE OF BIRTH:  [birth year]  SUBJECTIVE:  Remains same, critically sick, husband at bedside, tachycardic REVIEW OF SYSTEMS:    Review of Systems  Unable to perform ROS: Acuity of condition   Tolerating Diet: npo DRUG ALLERGIES:   Allergies  Allergen Reactions  . Angiotensin Receptor Blockers Other (See Comments)    Reaction:  Hyperkalemia  . Penicillins Rash and Other (See Comments)    rash    VITALS:  Blood pressure (!) 96/57, pulse (!) 122, temperature 98.3 F (36.8 C), temperature source Axillary, resp. rate (!) 24, height 5\' 1"  (1.549 m), weight 103.8 kg (228 lb 13.4 oz), SpO2 100 %.  PHYSICAL EXAMINATION:   Physical Exam  Constitutional: [name], well-nourished, and in no distress. No distress.  HENT:  Head: Normocephalic.  Eyes: No scleral icterus.  Neck: Neck supple. No JVD present. No tracheal deviation present.  Cardiovascular: Normal rate and normal heart sounds.  Exam reveals no gallop and no friction rub.   No murmur heard. irr, irr  Pulmonary/Chest: Effort normal and breath sounds normal. No respiratory distress. She has no wheezes. She has no rales. She exhibits no tenderness.  Abdominal: Soft. Bowel sounds are normal. She exhibits no distension and no mass. There is no tenderness. There is no rebound and no guarding.  Musculoskeletal: She exhibits edema.  Left BKA  Neurological:  Lethargic and not very responsive  Skin: Skin is warm. Rash noted. She is not diaphoretic. No erythema.  Psychiatric:  Lethargic   LABORATORY PANEL:   CBC  Recent Labs Lab 03/17/17 0500  WBC 9.0  HGB 9.5*  HCT 29.0*  PLT 143*   ------------------------------------------------------------------------------------------------------------------  Chemistries   Recent Labs Lab 03/15/17 0534  03/17/17 0500  NA 137  < > 136  K 3.5  < > 3.5  CL 99*  < >  100*  CO2 28  < > 28  GLUCOSE 97  < > 235*  BUN 15  < > 14  CREATININE 2.17*  < > 2.21*  CALCIUM 8.5*  < > 8.4*  MG 1.7  --   --   < > = values in this interval not displayed. ------------------------------------------------------------------------------------------------------------------  Cardiac Enzymes  Recent Labs Lab 03/15/17 1241 03/15/17 1823 03/16/17 0435  TROPONINI 0.04* 0.04* 0.04*   ------------------------------------------------------------------------------------------------------------------  RADIOLOGY:  No results found.   ASSESSMENT AND PLAN:  83 year old female with ESRD postop left below-knee amputation for left foot ulceration/osteomyelitis.  1. Acute Metabolic encephalopathy due to ICU delirium and post op delirium/hypotension related? Patient has declined significantly  Palliative care is talking daily with family - not quite ready yet, daily meeting planned  2. Postoperative day #7 left below the knee amputation: Management as per surgery  3 Postop hypotension on baseline chronic hypotension: unable to take in any oral medications due to mental status  - Patient on Neo-Synephrine drip to maintain blood pressure  4 ESRD on hemodialysis as per nephrology No plans for dialysis today, may get one tomorrow per nephro  5. A. fib with RVR,: Due to low BP BB stopped. Tachycardic in 130s at rest - amiodarone drip per PCCM -Continue midodrine wean hydrocortisone, - Also on metoprolol for rate control as needed  6. Diabetes: Continue sliding scale.   7. RLE chronic nonhealing wounds: :Cleanse right leg with soap and water. Aquacel Ag to wound bed. Cover with kerlix layer from  below toes to below knee. Secure with Coban layer. Change three times weekly. Mon/Wed/Fri. D/w dr Juanell Fairly, Palliative care and patient's husband    Remaining with husband this morning at bedside.  He is leaning towards keeping her home with hospice services.  He would like  for her kids to make the final decision though.   CODE STATUS: DNR  crititcal care TOTAL TIME TAKING CARE OF THIS PATIENT: 15 minutes.     POSSIBLE D/C ?, DEPENDING ON CLINICAL CONDITION.   Max Sane M.D on 03/17/2017 at 2:56 PM  Between 7am to 6pm - Pager - [phone] After 6pm go to [email or web address] - password [email or web address] [facility]  Office  [phone]  CC: Primary care physician; Rusty Aus, MD  Note: This dictation was prepared with Dragon dictation along with smaller phrase technology. Any transcriptional errors that result from this process are unintentional.

## 2017-03-17 NOTE — Progress Notes (Signed)
Family Meeting Note  Advance Directive:yes  Today a meeting took place with the Patient and spouse.  Patient is unable to participate due OY:DXAJOI capacity Comatose   The following clinical team members were present during this meeting:MD  The following were discussed:Patient's diagnosis: , Patient's progosis: < 12 months and Goals for treatment: DNR  Additional follow-up to be provided: Palliative care following, I had a long discussion with husband at bedside.  He is willing to take patient home with hospice, although he prefers to have further discussion with patient's for kids to make sure they are also on board.  Palliative care, trying to get in touch with family  Time spent during discussion:20 minutes  Max Sane, MD

## 2017-03-17 NOTE — Progress Notes (Signed)
Subjective:  Patient seen at bedside. She remains on phenylephrine. Patient had hemodialysis yesterday and ultrafiltration achieved was 0.5 kg. Currently lethargic but is arousable. Having hallucinations periodically.   Objective:  Vital signs in last 24 hours:  Temp:  [98.3 F (36.8 C)-98.6 F (37 C)] 98.6 F (37 C) (04/04 1945) Pulse Rate:  [120-153] 145 (04/05 0800) Resp:  [10-40] 21 (04/05 0800) BP: (85-117)/(51-76) 97/58 (04/05 0800) SpO2:  [96 %-100 %] 98 % (04/05 0800)  Weight change:  Filed Weights   03/11/17 1345 03/14/17 1528 03/16/17 0423  Weight: 97 kg (213 lb 13.5 oz) 99.8 kg (220 lb 0.3 oz) 103.8 kg (228 lb 13.4 oz)    Intake/Output:    Intake/Output Summary (Last 24 hours) at 03/17/17 0093 Last data filed at 03/16/17 1753  Gross per 24 hour  Intake           1122.5 ml  Output              500 ml  Net            622.5 ml     Physical Exam: General: No acute distress  HEENT Moist oral mucus membranes  Neck supple  Pulm/lungs Normal effort, clear to auscultation  CVS/Heart Irregular, A Fib, tachycardic  Abdomen:  Soft, NTND  Extremities: Left BKA, Rt foot in bandage  Neurologic: Lethargic but arousable  Access: Left forearm AVF          Basic Metabolic Panel:   Recent Labs Lab 03/13/17 0505 03/14/17 0442 03/15/17 0534 03/16/17 0435 03/17/17 0500  NA 133* 134* 137 138 136  K 3.8 3.9 3.5 3.6 3.5  CL 99* 101 99* 103 100*  CO2 25 24 28 25 28   GLUCOSE 143* 124* 97 156* 235*  BUN 17 23* 15 17 14   CREATININE 2.56* 2.96* 2.17* 2.54* 2.21*  CALCIUM 8.4* 8.6* 8.5* 8.3* 8.4*  MG  --   --  1.7  --   --   PHOS 3.2  --   --   --   --      CBC:  Recent Labs Lab 03/12/17 0628 03/13/17 0505 03/15/17 0534 03/16/17 0435 03/17/17 0500  WBC 13.3* 10.1 9.2 9.0 9.0  HGB 10.1* 9.3* 9.4* 9.5* 9.5*  HCT 30.8* 28.5* 28.4* 29.1* 29.0*  MCV 101.5* 101.7* 102.2* 101.2* 101.5*  PLT 219 226 153 175 143*      Microbiology:  Recent Results  (from the past 720 hour(s))  Aerobic Culture (superficial specimen)     Status: None   Collection Time: 02/22/17  3:50 PM  Result Value Ref Range Status   Specimen Description FOOT LEFT  Final   Special Requests CALCANEUS  Final   Gram Stain   Final    RARE WBC PRESENT, PREDOMINANTLY PMN ABUNDANT GRAM POSITIVE COCCI IN PAIRS ABUNDANT GRAM NEGATIVE COCCOBACILLI FEW YEAST Performed at [facility], [address] [place], [place] [address]    Culture   Final    ABUNDANT KLEBSIELLA PNEUMONIAE ABUNDANT ENTEROCOCCUS FAECALIS    Report Status 02/27/2017 FINAL  Final   Organism ID, Bacteria KLEBSIELLA PNEUMONIAE  Final   Organism ID, Bacteria ENTEROCOCCUS FAECALIS  Final      Susceptibility   Enterococcus faecalis - MIC*    AMPICILLIN <=2 SENSITIVE Sensitive     VANCOMYCIN 1 SENSITIVE Sensitive     GENTAMICIN SYNERGY RESISTANT Resistant     * ABUNDANT ENTEROCOCCUS FAECALIS   Klebsiella pneumoniae - MIC*    AMPICILLIN >=  32 RESISTANT Resistant     CEFAZOLIN <=4 SENSITIVE Sensitive     CEFEPIME <=1 SENSITIVE Sensitive     CEFTAZIDIME <=1 SENSITIVE Sensitive     CEFTRIAXONE <=1 SENSITIVE Sensitive     CIPROFLOXACIN <=0.25 SENSITIVE Sensitive     GENTAMICIN <=1 SENSITIVE Sensitive     IMIPENEM <=0.25 SENSITIVE Sensitive     TRIMETH/SULFA <=20 SENSITIVE Sensitive     AMPICILLIN/SULBACTAM 4 SENSITIVE Sensitive     PIP/TAZO <=4 SENSITIVE Sensitive     Extended ESBL NEGATIVE Sensitive     * ABUNDANT KLEBSIELLA PNEUMONIAE  Surgical pcr screen     Status: Abnormal   Collection Time: 03/03/17  8:45 AM  Result Value Ref Range Status   MRSA, PCR POSITIVE (A) NEGATIVE Final    Comment: RESULT CALLED TO, READ BACK BY AND VERIFIED WITH: GAIL GRAVES 03/03/17 1140 SGD    Staphylococcus aureus POSITIVE (A) NEGATIVE Final    Comment:        The Xpert SA Assay (FDA approved for NASAL specimens in patients over 73 years of age), is one component of a comprehensive surveillance program.   Test performance has been validated by [facility] for patients greater than or equal to 52 year old. It is not intended to diagnose infection nor to guide or monitor treatment.     Coagulation Studies: No results for input(s): LABPROT, INR in the last 72 hours.  Urinalysis: No results for input(s): COLORURINE, LABSPEC, PHURINE, GLUCOSEU, HGBUR, BILIRUBINUR, KETONESUR, PROTEINUR, UROBILINOGEN, NITRITE, LEUKOCYTESUR in the last 72 hours.  Invalid input(s): APPERANCEUR    Imaging: Ct Head Wo Contrast  Result Date: 03/15/2017 CLINICAL DATA:  Acute encephalopathy with progressive altered mental status over night. Nonresponsive currently. EXAM: CT HEAD WITHOUT CONTRAST TECHNIQUE: Contiguous axial images were obtained from the base of the skull through the vertex without intravenous contrast. COMPARISON:  03/05/2015. FINDINGS: Brain: Diffusely enlarged ventricles and subarachnoid spaces. Patchy white matter low density in both cerebral hemispheres. No intracranial hemorrhage, mass lesion or CT evidence of acute infarction. Vascular: No hyperdense vessel or unexpected calcification. Skull: Progressive right mastoid air cell opacification with resolved left mastoid air cell mucosal thickening. Sinuses/Orbits: Interval opacification of the right frontal and right anterior ethmoid sinuses. Left maxillary sinus mucosal thickening. Status post cataract removal on the left. Other: None. IMPRESSION: 1. No acute abnormality. 2. Mildly progressive atrophy and chronic small vessel white matter ischemic changes. 3. Chronic right frontal, right ethmoid and left maxillary sinusitis. 4. Progressive right mastoid air cell opacification. Electronically Signed   By: Claudie Revering M.D.   On: 03/15/2017 13:51     Medications:   . phenylephrine (NEO-SYNEPHRINE) Adult infusion 4 mcg/min (03/17/17 0847)   . albumin human  12.5 g Intravenous Once in dialysis  . amiodarone  200 mg Oral Daily  . ceFEPIme  1 g  Intravenous Q24H  . docusate sodium  100 mg Oral BID  . epoetin (EPOGEN/PROCRIT) injection  4,000 Units Intravenous Q M,W,F-HD  . feeding supplement (NEPRO CARB STEADY)  237 mL Oral BID BM  . heparin  5,000 Units Subcutaneous Q8H  . hydrocortisone sod succinate (SOLU-CORTEF) inj  25 mg Intravenous Q12H  . insulin aspart  0-9 Units Subcutaneous TID WC  . mouth rinse  15 mL Mouth Rinse BID  . megestrol  40 mg Oral Daily  . midodrine  10 mg Oral TID  . multivitamin  1 tablet Oral Daily  . pantoprazole  40 mg Oral Daily   acetaminophen **OR** acetaminophen,  alum & mag hydroxide-simeth, fentaNYL (SUBLIMAZE) injection, guaiFENesin-dextromethorphan, ipratropium-albuterol, magnesium sulfate 1 - 4 g bolus IVPB, metoprolol, morphine injection, oxyCODONE-acetaminophen, phenol, polyethylene glycol, promethazine, senna, sorbitol  Assessment/ Plan:  83 y.o. female   with end-stage renal disease on hemodialysis, diabetes mellitus type 2, hypertension, obstructive sleep apnea, atrial fibrillation, arthritis, anemia, distal right femoral fracture, admitted on 09/26/2016 for Leg edema [R60.0] Venous thrombosis [I82.90] ,rt leg DVT  MWF [place]  1. End-stage renal disease-  MWF - Patient did complete hemodialysis yesterday with an ultrafiltration of 0.5 kg. We will plan for dialysis again tomorrow. Unlikely that she would be able to tolerate outpatient dialysis given her current status.  2.  Hypotension:  - She remains on phenylephrine at 4 mcg/m. Blood pressure currently 97/58.   3. Anemia of chronic kidney disease - Hemoglobin remains stable at 9.5. Continue to monitor.  4. Secondary hyperparathyroidism:   Recheck phosphorus with dialysis tomorrow.  5. LE edema Continues to have considerable lower extremity edema. However hypotension is limiting our ability to perform ultrafiltration safely.   LOS: 7 [name] 4/5/20189:22 AM

## 2017-03-17 NOTE — Consult Note (Signed)
[place] Nurse wound consult note  Reason for Consult: RLE chronic nonhealing wounds. Seen at [facility].  S/P L BKA, will be managed by surgical services.  Increased edema noted to right lower leg.  Epidermal separation to right anterior lower leg.  Edges approximated dressed per skin tear protocol with nonadherent silicone contact layer Right facial lesion, scabbed. Caused by CPAP machine at home, per patient. WIll need padding with silicone border foam dressing for protection with CPAP.  Wound type:Chronic nonhealing. Pressure Injury POA: Yes Measurement:Right dorsal foot: 4 cm x 2.8 cm with visible tendon. New epithelial separation (consistent with increased edema) to right anterior lower leg near lateral aspect.   Right posterior lower leg/Achilles tendon region Wound CBJ:SEGBTD and 75% devitalized tissue. Drainage (amount, consistency, odor) Moderate serosanguinous Musty odor.  Periwound:Dry skin. Chronic changes, edema Dressing procedure/placement/frequency:Cleanse right leg with soap and water. Aquacel Ag to wound bed. Cover with kerlix layer from below toes to below knee. Secure with Coban layer. Change three times weekly. Mon/Wed/Fri. [place] team will follow.  Domenic Moras RN BSN [place] Pager [phone]

## 2017-03-17 NOTE — Progress Notes (Signed)
Inpatient Diabetes Program Recommendations  AACE/ADA: New Consensus Statement on Inpatient Glycemic Control (2015)  Target Ranges:  Prepandial:   less than 140 mg/dL      Peak postprandial:   less than 180 mg/dL (1-2 hours)      Critically ill patients:  140 - 180 mg/dL   Lab Results  Component Value Date   GLUCAP 198 (H) 03/17/2017   HGBA1C 6.0 04/07/2016    Review of Glycemic Control  Results for [name], [name] (MRN [id number]) as of 03/17/2017 12:17  Ref. Range 03/16/2017 07:37 03/16/2017 11:41 03/16/2017 17:11 03/16/2017 21:41 03/17/2017 09:06  Glucose-Capillary Latest Ref Range: 65 - 99 mg/dL 146 (H) 197 (H) 180 (H) 218 (H) 198 (H)   Diabetes history: Type 2 Outpatient Diabetes medications: Lantus 10 units qhs Current orders for Inpatient glycemic control: Novolog 0-9 units tid  *  Steroids 25mg  q12h  Inpatient Diabetes Program Recommendations:    Consider adding Novolog 0-5 units qhs.   Gentry Fitz, RN, BA, MHA, CDE Diabetes Coordinator Inpatient Diabetes Program  [phone] (Team Pager) [phone] ([place]) 03/17/2017 12:24 PM

## 2017-03-17 NOTE — Progress Notes (Signed)
Daily Progress Note   Patient Name: [name]       Date: 03/17/2017 DOB: June 20, 1934  Age: 83 y.o. MRN#: [id number] Attending Physician: Max Sane, MD Primary Care Physician: Rusty Aus, MD Admit Date: 03/10/2017  Reason for Consultation/Follow-up: Establishing goals of care and Withdrawal of life-sustaining treatment  Subjective: Patient drowsy this morning. Will wake to voice. Denies pain or discomfort.   Husband at bedside. Asked to step out of the room. He does not want the word hospice to be said by his wife. He feels she understands that she is dying but becomes more anxious when she hears that word. He does not doubt that she will be going to heaven from here and feels she is not afraid. Husband very realistic on poor prognosis and wants to take her home. He states "I don't think she is going to pull through this time."  He wants all of the 10 children to agree with the plan but feels many of them are in denial and feel guilty that they haven't visited with her more often. Explained that this decision is ultimately up to him to make whether all of the children agree or not. Praised him for caring for her all these years and valuing her wishes by taking her home at the end of her life.   Educated on hospice services and the support they can be for him in the home. He agrees with home hospice but does not want the word "hospice" to be spoken. Educated on expectations and prognosis when dialysis is discontinued. He is waiting to discuss with family. Plan is for dialysis to be attempted again tomorrow. Husband becomes tearful during our conversation. Reassured him of my continued support through hospitalization and strongly encouraged he gather family to meet with me. He feels they will not  all visit the hospital.   Provided emotional and spiritual support.      Length of Stay: 7  Current Medications: Scheduled Meds:  . albumin human  12.5 g Intravenous Once in dialysis  . amiodarone  200 mg Oral Daily  . ceFEPIme  1 g Intravenous Q24H  . docusate sodium  100 mg Oral BID  . epoetin (EPOGEN/PROCRIT) injection  4,000 Units Intravenous Q M,W,F-HD  . feeding supplement (NEPRO CARB STEADY)  237 mL Oral BID  BM  . heparin  5,000 Units Subcutaneous Q8H  . hydrocortisone sod succinate (SOLU-CORTEF) inj  25 mg Intravenous Q12H  . insulin aspart  0-9 Units Subcutaneous TID WC  . mouth rinse  15 mL Mouth Rinse BID  . megestrol  40 mg Oral Daily  . midodrine  10 mg Oral TID  . multivitamin  1 tablet Oral Daily  . pantoprazole  40 mg Oral Daily    Continuous Infusions: . phenylephrine (NEO-SYNEPHRINE) Adult infusion 3 mcg/min (03/17/17 1015)    PRN Meds: acetaminophen **OR** acetaminophen, alum & mag hydroxide-simeth, fentaNYL (SUBLIMAZE) injection, guaiFENesin-dextromethorphan, ipratropium-albuterol, magnesium sulfate 1 - 4 g bolus IVPB, metoprolol, morphine injection, oxyCODONE-acetaminophen, phenol, polyethylene glycol, promethazine, senna, sorbitol  Physical Exam  Constitutional: She is easily aroused. She appears ill.  HENT:  Head: Normocephalic and atraumatic.  Cardiovascular: An irregularly irregular rhythm present.  Pulmonary/Chest: Effort normal. She has decreased breath sounds.  Abdominal: Normal appearance. There is no tenderness.  Neurological: She is easily aroused.  Skin: Skin is warm and dry. There is pallor.  Psychiatric: Her speech is delayed. She is slowed. She is inattentive.  Nursing note and vitals reviewed.          Vital Signs: BP 113/62   Pulse (!) 131   Temp 98.3 F (36.8 C) (Axillary)   Resp 14   Ht 5\' 1"  (1.549 m)   Wt 103.8 kg (228 lb 13.4 oz)   SpO2 100%   BMI 43.24 kg/m  SpO2: SpO2: 100 % O2 Device: O2 Device: Nasal Cannula O2 Flow  Rate: O2 Flow Rate (L/min): 2 L/min  Intake/output summary:  Intake/Output Summary (Last 24 hours) at 03/17/17 1030 Last data filed at 03/17/17 0915  Gross per 24 hour  Intake           1122.5 ml  Output              501 ml  Net            621.5 ml   LBM: Last BM Date: 03/13/17 Baseline Weight: Weight: 106.1 kg (234 lb) Most recent weight: Weight: 103.8 kg (228 lb 13.4 oz)       Palliative Assessment/Data: PPS 30%   Flowsheet Rows     Most Recent Value  Intake Tab  Referral Department  Critical care  Unit at Time of Referral  ICU  Palliative Care Primary Diagnosis  Nephrology  Date Notified  03/15/17  Palliative Care Type  Return patient Palliative Care  Reason for referral  Clarify Goals of Care  Date of Admission  03/10/17  Date first seen by Palliative Care  03/16/17  # of days Palliative referral response time  1 Day(s)  # of days IP prior to Palliative referral  5  Clinical Assessment  Palliative Performance Scale Score  30%  Psychosocial & Spiritual Assessment  Palliative Care Outcomes  Patient/Family meeting held?  Yes  Who was at the meeting?  patient, husband, son  Palliative Care Outcomes  Clarified goals of care, Provided psychosocial or spiritual support, Provided end of life care assistance, Counseled regarding hospice, ACP counseling assistance      Patient Active Problem List   Diagnosis Date Noted  . S/P BKA (below knee amputation), left ([place])   . Goals of care, counseling/discussion   . Septic shock ([place])   . Osteomyelitis of ankle or foot, left, acute ([place]) 03/10/2017  . Osteomyelitis of ankle or foot, acute, left ([place]) 03/10/2017  . Ulceration ([place]) 12/14/2016  .  Lymphedema 11/16/2016  . Pressure injury of skin 09/27/2016  . DVT (deep venous thrombosis) ([place]) 09/26/2016  . Palliative care by specialist   . Closed nondisplaced fracture of lateral condyle of right femur ([employer])   . Hyponatremia 09/09/2016  . Hypoxia 04/06/2016  .  Healthcare-associated pneumonia 08/14/2015  . Hypoglycemia 08/14/2015  . Hypotension 08/14/2015  . ESRD on hemodialysis ([place]) 08/14/2015  . DM (diabetes mellitus) ([place]) 08/14/2015  . Pleural effusion on right   . Protein-calorie malnutrition, severe ([place]) 08/06/2015  . Community acquired pneumonia   . Pleural effusion 08/03/2015  . Acute respiratory failure ([place]) 08/03/2015  . Cough 08/03/2015  . Atrial fibrillation with RVR ([place]) 08/03/2015    Palliative Care Assessment & Plan   Patient Profile: 83 y.o. female  with past medical history of ESRD on HD, HTN, DM, atrial fibrillation, OSA on CPAP HS, CHF, GERD, asthma, and anemia admitted on 03/10/2017 for a scheduled left below the knee amputation due to left foot ulceration/osteomyelitis. During procedure, patient went into afib RVR and became hypotensive requiring pressors. Also, ESRD on HD-not tolerating well due to hypotension and afib RVR. Patient with lethargy on 4/3. Remains critically ill on neo gtt.  Palliative medicine consultation for goals of care.   Assessment: ESRD on HD Afib RVR Hypotension Acute encephalopathy  S/p left BKA RLE chronic nonhealing wounds  Recommendations/Plan:  DNR/DNI  Husband open to taking patient home with hospice. Waiting to discuss with family to agree with plan of care.   Strongly encouraged he gather family to meet with me to discuss [place]. They may not all arrive until the weekend.   PMT will continue to support patient/family through hospitalization.  Goals of Care and Additional Recommendations:  Limitations on Scope of Treatment: Full Scope Treatment-except DNR/DNI  Code Status: DNR/DNI   Code Status Orders        Start     Ordered   03/10/17 3419  Do not attempt resuscitation (DNR)  Continuous    Question Answer Comment  In the event of cardiac or respiratory ARREST Do not call a "code blue"   In the event of cardiac or respiratory ARREST Do not perform Intubation, CPR,  defibrillation or ACLS   In the event of cardiac or respiratory ARREST Use medication by any route, position, wound care, and other measures to relive pain and suffering. May use oxygen, suction and manual treatment of airway obstruction as needed for comfort.      03/10/17 1743    Code Status History    Date Active Date Inactive Code Status Order ID Comments User Context   03/10/2017  4:49 PM 03/10/2017  5:43 PM Full Code [id number]  Algernon Huxley, MD Inpatient   09/26/2016  7:27 PM 09/28/2016  9:19 PM DNR [id number]  Idelle Crouch, MD Inpatient   09/11/2016 12:12 PM 09/21/2016  3:50 PM DNR [id number]  Laverle Hobby, MD Inpatient   09/10/2016 12:48 AM 09/10/2016  2:44 PM Full Code [id number]  Harvie Bridge, DO Inpatient   04/06/2016  4:59 PM 04/10/2016  7:57 PM Full Code [id number]  Epifanio Lesches, MD ED   11/20/2015  3:03 PM 11/20/2015  6:10 PM Full Code [id number]  Algernon Huxley, MD Inpatient   08/14/2015  5:40 AM 08/21/2015 10:45 PM Full Code [id number]  Juluis Mire, MD Inpatient   08/02/2015  1:44 PM 08/11/2015 10:21 PM Full Code [id number]  Gladstone Lighter, MD Inpatient       Prognosis:   Unable to determine-poor  with ESRD on HD and not tolerating dialysis (afib RVR/hypotensive on neo gtt)  Discharge Planning:  To Be Determined likely home with hospice services   Care plan was discussed with patient, husband, Dr. Juanell Fairly, Dr Holley Raring, RN  Thank you for allowing the Palliative Medicine Team to assist in the care of this patient.   Time In: 1000 Time Out: 1035 Total Time 69min Prolonged Time Billed  no      Greater than 50%  of this time was spent counseling and coordinating care related to the above assessment and plan.  Ihor Dow, FNP-C Palliative Medicine Team  Phone: [phone] Fax: [phone]  Please contact Palliative Medicine Team phone at [phone] for questions and concerns.

## 2017-03-17 NOTE — Progress Notes (Signed)
  Amiodarone Drug - Drug Interaction Consult Note  Recommendations: No adjustments warranted at this time.   Amiodarone is metabolized by the cytochrome P450 system and therefore has the potential to cause many drug interactions. Amiodarone has an average plasma half-life of 50 days (range 20 to 100 days).   There is potential for drug interactions to occur several weeks or months after stopping treatment and the onset of drug interactions may be slow after initiating amiodarone.   []  Statins: Increased risk of myopathy. Simvastatin- restrict dose to 20mg  daily. Other statins: counsel patients to report any muscle pain or weakness immediately.  []  Anticoagulants: Amiodarone can increase anticoagulant effect. Consider warfarin dose reduction. Patients should be monitored closely and the dose of anticoagulant altered accordingly, remembering that amiodarone levels take several weeks to stabilize.  []  Antiepileptics: Amiodarone can increase plasma concentration of phenytoin, the dose should be reduced. Note that small changes in phenytoin dose can result in large changes in levels. Monitor patient and counsel on signs of toxicity.  []  Beta blockers: increased risk of bradycardia, AV block and myocardial depression. Sotalol - avoid concomitant use.  []   Calcium channel blockers (diltiazem and verapamil): increased risk of bradycardia, AV block and myocardial depression.  []   Cyclosporine: Amiodarone increases levels of cyclosporine. Reduced dose of cyclosporine is recommended.  []  Digoxin dose should be halved when amiodarone is started.  []  Diuretics: increased risk of cardiotoxicity if hypokalemia occurs.  []  Oral hypoglycemic agents (glyburide, glipizide, glimepiride): increased risk of hypoglycemia. Patient's glucose levels should be monitored closely when initiating amiodarone therapy.   []  Drugs that prolong the QT interval:  Torsades de pointes risk may be increased with concurrent use  - avoid if possible.  Monitor QTc, also keep magnesium/potassium WNL if concurrent therapy can't be avoided. Marland Kitchen Antibiotics: e.g. fluoroquinolones, erythromycin. . Antiarrhythmics: e.g. quinidine, procainamide, disopyramide, sotalol. . Antipsychotics: e.g. phenothiazines, haloperidol.  . Lithium, tricyclic antidepressants, and methadone. Thank You,  Simpson,Michael L  03/17/2017 2:48 PM

## 2017-03-17 NOTE — Progress Notes (Signed)
PT Cancellation Note  Patient Details Name: [name] MRN: [id number] DOB: 03/28/1934   Cancelled Treatment:    Reason Eval/Treat Not Completed: Patient declined, no reason specified;Other (comment). Treatment attempted; spouse at bedside. Pt initially sleeping/moaning in her sleep. Questioned spouse about attempting PT; spouse notes pt has been in a lot of pain today and was recently given pain medication. Spouse is unsure of what to do, but ultimately says "you cant try". Approaching pt bedside and pt awakens with relevant verbalization although she felt I was another family member. Pt states she is in a lot of pain in her Left lower extremity and tells me of amputation. Pt offered PT, but pt states "no not today, I'm in so much pain." Due to pt declining status and inability to participate with PT consistently; decrease frequency to 2-6x per week as discussed with rehab manager.    Larae Grooms, PTA 03/17/2017, 3:23 PM

## 2017-03-17 NOTE — Progress Notes (Signed)
* [facility]     Assessment and Plan:  IMPRESSION: ESRD Peripheral arterial disease S/P L BKA Chronic hypotension with systolics in the 37-628 range; continues on phenylephrine Chronic atrial fibrillation currently in RVR Acute Encephalopathy due to ?ICU/Post Op delirium vs. hypotension DNR/DNI Echo results; c/w with pulmonary hypertension, likely related to underlying medical comorbidities.   PLAN/REC:  Pt unable to tolerate po medications at this time will transition to Amiodarone gtt for rate control  Continue prn metoprolol for hr >120 Continue neosynephrine gtt to maintain map >31 and/or systolic >51 Cont current abx, empirically for possible septic shock. Continue midodrine once pt is able to tolerate po medications wean hydrocortisone, hold home dosing of propranolol for now due to continued hypotension.  HD per nephro.  DNR/DNI remains appropriate Palliative Care consulted appreciate input Prn Morphine for pain management  Date: 03/17/2017  MRN# [id number] [name] [birth year]  Subjective Pt with worsening lethargy today.    Medication:   Reviewed  Allergies:  Angiotensin receptor blockers and Penicillins  Review of Systems: Could not be obtained due to lethargy   Physical Examination:   VS: BP 113/62   Pulse (!) 131   Temp 98.3 F (36.8 C) (Axillary)   Resp 14   Ht 5\' 1"  (1.549 m)   Wt 103.8 kg (228 lb 13.4 oz)   SpO2 100%   BMI 43.24 kg/m   General Appearance: well developed, well nourished, NAD Neuro: worsening lethargy, opens eyes only to voice, not follow commands, PERRL HEENT: supple, no JVD Pulmonary: diminished throughout, even, non labored   Cardiovascular: irregular, irregular, no M/R/G   Abdomen: Benign, Soft, non-tender, hypoactive BS x4 Skin: Warm, left BKA incision dressing with dried drainage, una boot present right lower extremity  Extremities: left BKA, trace generalized edema   LABORATORY PANEL:    CBC  Recent Labs Lab 03/17/17 0500  WBC 9.0  HGB 9.5*  HCT 29.0*  PLT 143*   ------------------------------------------------------------------------------------------------------------------  Chemistries   Recent Labs Lab 03/15/17 0534  03/17/17 0500  NA 137  < > 136  K 3.5  < > 3.5  CL 99*  < > 100*  CO2 28  < > 28  GLUCOSE 97  < > 235*  BUN 15  < > 14  CREATININE 2.17*  < > 2.21*  CALCIUM 8.5*  < > 8.4*  MG 1.7  --   --   < > = values in this interval not displayed. ------------------------------------------------------------------------------------------------------------------  Cardiac Enzymes  Recent Labs Lab 03/16/17 0435  TROPONINI 0.04*   ------------------------------------------------------------  RADIOLOGY:   No results found for this or any previous visit. Results for orders placed during the hospital encounter of 03/03/17  Chest 2 View   Narrative CLINICAL DATA:  Preop.  EXAM: CHEST  2 VIEW  COMPARISON:  09/26/2016  FINDINGS: Cardiomegaly. Low lung volumes with bibasilar atelectasis or infiltrates, left greater than right. No effusions. No overt edema. No acute bony abnormality.  IMPRESSION: Low lung volumes with bibasilar atelectasis or infiltrates, left greater than right.  Cardiomegaly.   Electronically Signed   By: Rolm Baptise M.D.   On: 03/03/2017 09:53    -Update: Spoke with pts husband 04/5 regarding plan of care he stated he does not want his wife to suffer he is comfortable with her going home with hospice with the focus being comfort and the pt spending quality time with her family at the end of her life. However he would like input from  the pts son's.  The pt has a son that is local his name is [name] I spoke with him via telephone today 04/5 he stated he is not ready for the pt to go home with hospice with comfort care as the focus he would like his mother to continue hemodialysis and other treatment needed as  tolerated. He stated "If God is ready for her than the pt will pass away regardless of the treatment the pt receives during this hospitalization."  According to Mr. [name] the pt had a similar episode 1 yr ago and her condition improved despite physicians telling him her prognosis was poor at that time.  He is agreeable to speak with his siblings regarding the current plan of care and the pts current status.  I informed Mr. [name] if there are any s/sx his mother is in pain and/or suffering we will need to have another conversation regarding the plan of care he is agreeable to this.  I informed Mr. [name] of the conversation I had with [name] he stated he does not want the pts son's to know he is ok with the pt going home with hospice.  He stated "there is nothing else I can do at this time."  He understands he can make the decision to transition the pt home with hospice without the pts son's permission, however he does not feel comfortable with making that decision without the son's approval.  Marda Stalker, [place] Pager [phone] (please enter 7 digits) [place] Pager [phone] (please enter 7 digits)  Pt seen and examined with NP, agree with assessment and plan as amended, discussed with palliative care, hospitalist, nephrology. Pt continues to be hypotensive, has not made any significant progress. MS continues to deteriorate with metabolic encephalopathy. Discussed with family as detailed above, husband is struggling to make decisions.  Continue current measures.   Marda Stalker, M.D.  03/17/2017

## 2017-03-17 NOTE — Progress Notes (Signed)
Pharmacy Antibiotic Note  [name] is a 83 y.o. female admitted on 03/10/2017 with sepsis.  Pharmacy has been consulted for cefepime dosing.   Plan: Will continue cefepime to 1gm IV every 24 hours, to be given after dialysis on HD days.    Height: 5\' 1"  (154.9 cm) Weight: 228 lb 13.4 oz (103.8 kg) IBW/kg (Calculated) : 47.8  Temp (24hrs), Avg:98.4 F (36.9 C), Min:98.3 F (36.8 C), Max:98.6 F (37 C)   Recent Labs Lab 03/12/17 0628 03/13/17 0505 03/14/17 0442 03/15/17 0534 03/16/17 0435 03/17/17 0500  WBC 13.3* 10.1  --  9.2 9.0 9.0  CREATININE  --  2.56* 2.96* 2.17* 2.54* 2.21*    Estimated Creatinine Clearance: 22.1 mL/min (A) (by C-G formula based on SCr of 2.21 mg/dL (H)).    Allergies  Allergen Reactions  . Angiotensin Receptor Blockers Other (See Comments)    Reaction:  Hyperkalemia  . Penicillins Rash and Other (See Comments)    rash    Thank you for allowing pharmacy to be a part of this patient's care.  Simpson,Michael L,  03/17/2017 3:57 PM

## 2017-03-18 ENCOUNTER — Inpatient Hospital Stay: Payer: Medicare Other

## 2017-03-18 LAB — CBC WITH DIFFERENTIAL/PLATELET
BASOS ABS: 0 10*3/uL (ref 0–0.1)
BASOS PCT: 0 %
Eosinophils Absolute: 0 10*3/uL (ref 0–0.7)
Eosinophils Relative: 0 %
HEMATOCRIT: 30.2 % — AB (ref 35.0–47.0)
Hemoglobin: 9.9 g/dL — ABNORMAL LOW (ref 12.0–16.0)
Lymphocytes Relative: 9 %
Lymphs Abs: 0.9 10*3/uL — ABNORMAL LOW (ref 1.0–3.6)
MCH: 34.2 pg — ABNORMAL HIGH (ref 26.0–34.0)
MCHC: 32.9 g/dL (ref 32.0–36.0)
MCV: 104.1 fL — ABNORMAL HIGH (ref 80.0–100.0)
MONO ABS: 1 10*3/uL — AB (ref 0.2–0.9)
Monocytes Relative: 10 %
NEUTROS ABS: 8.1 10*3/uL — AB (ref 1.4–6.5)
Neutrophils Relative %: 81 %
PLATELETS: 119 10*3/uL — AB (ref 150–440)
RBC: 2.9 MIL/uL — AB (ref 3.80–5.20)
RDW: 18.3 % — AB (ref 11.5–14.5)
WBC: 10 10*3/uL (ref 3.6–11.0)

## 2017-03-18 LAB — BASIC METABOLIC PANEL
ANION GAP: 8 (ref 5–15)
BUN: 17 mg/dL (ref 6–20)
CHLORIDE: 101 mmol/L (ref 101–111)
CO2: 28 mmol/L (ref 22–32)
Calcium: 8.7 mg/dL — ABNORMAL LOW (ref 8.9–10.3)
Creatinine, Ser: 2.64 mg/dL — ABNORMAL HIGH (ref 0.44–1.00)
GFR calc non Af Amer: 16 mL/min — ABNORMAL LOW (ref >60)
GFR, EST AFRICAN AMERICAN: 18 mL/min — AB (ref >60)
GLUCOSE: 212 mg/dL — AB (ref 65–99)
POTASSIUM: 3.6 mmol/L (ref 3.5–5.1)
Sodium: 137 mmol/L (ref 135–145)

## 2017-03-18 LAB — GLUCOSE, CAPILLARY
GLUCOSE-CAPILLARY: 158 mg/dL — AB (ref 65–99)
GLUCOSE-CAPILLARY: 125 mg/dL — AB (ref 65–99)
Glucose-Capillary: 199 mg/dL — ABNORMAL HIGH (ref 65–99)
Glucose-Capillary: 188 mg/dL — ABNORMAL HIGH (ref 65–99)

## 2017-03-18 LAB — PHOSPHORUS: PHOSPHORUS: 2.5 mg/dL (ref 2.5–4.6)

## 2017-03-18 NOTE — Progress Notes (Signed)
Subjective:  Patient seen at bedside. Currently on nasal BiPAP. She is due for hemodialysis today. Her weight appears to be increasing which is likely to inability to safely perform ultrafiltration.   Objective:  Vital signs in last 24 hours:  Temp:  [97.4 F (36.3 C)-98.3 F (36.8 C)] 97.5 F (36.4 C) (04/06 0830) Pulse Rate:  [104-136] 113 (04/06 0830) Resp:  [6-26] 17 (04/06 0830) BP: (82-115)/(45-73) 98/62 (04/06 0830) SpO2:  [89 %-100 %] 100 % (04/06 0830)  Weight change:  Filed Weights   03/11/17 1345 03/14/17 1528 03/16/17 0423  Weight: 97 kg (213 lb 13.5 oz) 99.8 kg (220 lb 0.3 oz) 103.8 kg (228 lb 13.4 oz)    Intake/Output:    Intake/Output Summary (Last 24 hours) at 03/18/17 0902 Last data filed at 03/18/17 0800  Gross per 24 hour  Intake           1096.3 ml  Output                2 ml  Net           1094.3 ml     Physical Exam: General: No acute distress  HEENT Moist oral mucus membranes  Neck supple  Pulm/lungs Normal effort, clear to auscultation  CVS/Heart Irregular, A Fib, tachycardic  Abdomen:  Soft, NTND  Extremities: Left BKA, Rt foot in bandage  Neurologic: Awake this a.m., follow simple commands, oriented to self and place.   Access: Left forearm AVF          Basic Metabolic Panel:   Recent Labs Lab 03/13/17 0505 03/14/17 0442 03/15/17 0534 03/16/17 0435 03/17/17 0500 03/18/17 0505  NA 133* 134* 137 138 136 137  K 3.8 3.9 3.5 3.6 3.5 3.6  CL 99* 101 99* 103 100* 101  CO2 25 24 28 25 28 28   GLUCOSE 143* 124* 97 156* 235* 212*  BUN 17 23* 15 17 14 17   CREATININE 2.56* 2.96* 2.17* 2.54* 2.21* 2.64*  CALCIUM 8.4* 8.6* 8.5* 8.3* 8.4* 8.7*  MG  --   --  1.7  --   --   --   PHOS 3.2  --   --   --   --   --      CBC:  Recent Labs Lab 03/13/17 0505 03/15/17 0534 03/16/17 0435 03/17/17 0500 03/18/17 0505  WBC 10.1 9.2 9.0 9.0 10.0  NEUTROABS  --   --   --   --  8.1*  HGB 9.3* 9.4* 9.5* 9.5* 9.9*  HCT 28.5* 28.4* 29.1*  29.0* 30.2*  MCV 101.7* 102.2* 101.2* 101.5* 104.1*  PLT 226 153 175 143* 119*      Microbiology:  Recent Results (from the past 720 hour(s))  Aerobic Culture (superficial specimen)     Status: None   Collection Time: 02/22/17  3:50 PM  Result Value Ref Range Status   Specimen Description FOOT LEFT  Final   Special Requests CALCANEUS  Final   Gram Stain   Final    RARE WBC PRESENT, PREDOMINANTLY PMN ABUNDANT GRAM POSITIVE COCCI IN PAIRS ABUNDANT GRAM NEGATIVE COCCOBACILLI FEW YEAST Performed at [facility], [address], [place], [place] [address]    Culture   Final    ABUNDANT KLEBSIELLA PNEUMONIAE ABUNDANT ENTEROCOCCUS FAECALIS    Report Status 02/27/2017 FINAL  Final   Organism ID, Bacteria KLEBSIELLA PNEUMONIAE  Final   Organism ID, Bacteria ENTEROCOCCUS FAECALIS  Final      Susceptibility   Enterococcus  faecalis - MIC*    AMPICILLIN <=2 SENSITIVE Sensitive     VANCOMYCIN 1 SENSITIVE Sensitive     GENTAMICIN SYNERGY RESISTANT Resistant     * ABUNDANT ENTEROCOCCUS FAECALIS   Klebsiella pneumoniae - MIC*    AMPICILLIN >=32 RESISTANT Resistant     CEFAZOLIN <=4 SENSITIVE Sensitive     CEFEPIME <=1 SENSITIVE Sensitive     CEFTAZIDIME <=1 SENSITIVE Sensitive     CEFTRIAXONE <=1 SENSITIVE Sensitive     CIPROFLOXACIN <=0.25 SENSITIVE Sensitive     GENTAMICIN <=1 SENSITIVE Sensitive     IMIPENEM <=0.25 SENSITIVE Sensitive     TRIMETH/SULFA <=20 SENSITIVE Sensitive     AMPICILLIN/SULBACTAM 4 SENSITIVE Sensitive     PIP/TAZO <=4 SENSITIVE Sensitive     Extended ESBL NEGATIVE Sensitive     * ABUNDANT KLEBSIELLA PNEUMONIAE  Surgical pcr screen     Status: Abnormal   Collection Time: 03/03/17  8:45 AM  Result Value Ref Range Status   MRSA, PCR POSITIVE (A) NEGATIVE Final    Comment: RESULT CALLED TO, READ BACK BY AND VERIFIED WITH: GAIL GRAVES 03/03/17 1140 SGD    Staphylococcus aureus POSITIVE (A) NEGATIVE Final    Comment:        The Xpert SA Assay  (FDA approved for NASAL specimens in patients over 45 years of age), is one component of a comprehensive surveillance program.  Test performance has been validated by [facility] for patients greater than or equal to 52 year old. It is not intended to diagnose infection nor to guide or monitor treatment.     Coagulation Studies: No results for input(s): LABPROT, INR in the last 72 hours.  Urinalysis: No results for input(s): COLORURINE, LABSPEC, PHURINE, GLUCOSEU, HGBUR, BILIRUBINUR, KETONESUR, PROTEINUR, UROBILINOGEN, NITRITE, LEUKOCYTESUR in the last 72 hours.  Invalid input(s): APPERANCEUR    Imaging: Dg Chest Port 1 View  Result Date: 03/18/2017 CLINICAL DATA:  Pulmonary vascular congestion EXAM: PORTABLE CHEST 1 VIEW COMPARISON:  April 11, 2017 FINDINGS: Central catheter tip is in the superior vena cava. No pneumothorax. There is partially loculated pleural effusion on the left with atelectasis in the left base. There is linear atelectasis in the right base. There is no appreciable pulmonary edema. There is stable cardiomegaly with pulmonary venous hypertension. There is evidence of mitral annulus calcification. There is aortic atherosclerosis. No adenopathy. Multiple rib fractures noted on the left, unchanged. IMPRESSION: Pulmonary vascular congestion. Pleural effusion on the left, partially loculated, stable. Pulmonary vascular congestion without appreciable edema. There is bibasilar atelectasis. There are rib fractures on the left. The fracture of the lateral left fifth rib appears recent. No pneumothorax. The overall appearance of the chest is similar compared to most recent study. Electronically Signed   By: Lowella Grip III M.D.   On: 03/18/2017 07:43     Medications:   . amiodarone 30 mg/hr (03/18/17 0323)  . phenylephrine (NEO-SYNEPHRINE) Adult infusion Stopped (03/17/17 1857)   . albumin human  12.5 g Intravenous Once in dialysis  . ceFEPIme  1 g Intravenous Q24H   . docusate sodium  100 mg Oral BID  . epoetin (EPOGEN/PROCRIT) injection  4,000 Units Intravenous Q M,W,F-HD  . feeding supplement (NEPRO CARB STEADY)  237 mL Oral BID BM  . heparin  5,000 Units Subcutaneous Q8H  . hydrocortisone sod succinate (SOLU-CORTEF) inj  25 mg Intravenous Q12H  . insulin aspart  0-9 Units Subcutaneous TID WC  . mouth rinse  15 mL Mouth Rinse BID  . megestrol  40 mg Oral Daily  . midodrine  10 mg Oral TID  . multivitamin  1 tablet Oral Daily  . pantoprazole  40 mg Oral Daily   acetaminophen **OR** acetaminophen, alum & mag hydroxide-simeth, fentaNYL (SUBLIMAZE) injection, guaiFENesin-dextromethorphan, ipratropium-albuterol, magnesium sulfate 1 - 4 g bolus IVPB, metoprolol, morphine injection, oxyCODONE-acetaminophen, phenol, polyethylene glycol, promethazine, senna, sorbitol  Assessment/ Plan:  83 y.o. female   with end-stage renal disease on hemodialysis, diabetes mellitus type 2, hypertension, obstructive sleep apnea, atrial fibrillation, arthritis, anemia, distal right femoral fracture, admitted on 09/26/2016 for Leg edema [R60.0] Venous thrombosis [I82.90] ,rt leg DVT  MWF [facility]  1. End-stage renal disease-  MWF - Patient's mental status appears to be improved this a.m. We will attempt hemodialysis. She is currently off of pressors but I have instructed nursing to reinitiated if blood pressure will drop during dialysis.  2.  Hypotension:  - As above phenylephrine has been discontinued. We may need to start back pressors during dialysis treatment if blood pressure drops.   3. Anemia of chronic kidney disease - Hemoglobin improved to 9.9. Continue Epogen 4000 units IV with dialysis.  4. Secondary hyperparathyroidism:   We plan to recheck serum phosphorus today.  5. LE edema Ultrafiltration target 0.5-1 kg today..   LOS: 8 [place] Mchugh 4/6/20189:02 AM

## 2017-03-18 NOTE — Progress Notes (Signed)
POST DIALYSIS ASSESSMENT 

## 2017-03-18 NOTE — Progress Notes (Signed)
This note also relates to the following rows which could not be included: Pulse Rate - Cannot attach notes to unvalidated device data Resp - Cannot attach notes to unvalidated device data BP - Cannot attach notes to unvalidated device data SpO2 - Cannot attach notes to unvalidated device data  Pre hd info

## 2017-03-18 NOTE — Progress Notes (Signed)
Pre hd assessment  

## 2017-03-18 NOTE — Care Management (Signed)
RNCM met with patient's husband regarding LTAC- he indicated that it is her children requesting LTAC "not him" because "it would kill her if I couldn't be with her". He said "I just wish the doctors would just tell Patrick Jupiter (son) that we need to stop torturing her. She needs to just come home and be able to die". I asked him if he thought a family meeting would be helpful and he said "it hasn't in the past". He did say that Tuesday may be a good day to "try to get everyone together".  I spoke with Hinton Dyer NP and she has talked to son Patrick Jupiter- "he wants everything done". However the husband does not which is Wayne's step-father. Mr. Ricchio does not want to be blamed for her death and he's not sure what the patient would want other than he knows she would want to go home and not to another facility.

## 2017-03-18 NOTE — Progress Notes (Signed)
This note also relates to the following rows which could not be included: Pulse Rate - Cannot attach notes to unvalidated device data Resp - Cannot attach notes to unvalidated device data  HD COMPLETED 

## 2017-03-18 NOTE — Progress Notes (Signed)
Pharmacy Antibiotic Note  [name] is a 83 y.o. female admitted on 03/10/2017 with sepsis.  Pharmacy has been consulted for cefepime dosing.  Plan: Will continue cefepime to 1gm IV every 24 hours, to be given after dialysis on HD days. Patient's last dose is on 4/6.   Height: 5\' 1"  (154.9 cm) Weight: 228 lb 13.4 oz (103.8 kg) IBW/kg (Calculated) : 47.8  Temp (24hrs), Avg:97.8 F (36.6 C), Min:97.4 F (36.3 C), Max:98.1 F (36.7 C)   Recent Labs Lab 03/13/17 0505 03/14/17 0442 03/15/17 0534 03/16/17 0435 03/17/17 0500 03/18/17 0505  WBC 10.1  --  9.2 9.0 9.0 10.0  CREATININE 2.56* 2.96* 2.17* 2.54* 2.21* 2.64*    Estimated Creatinine Clearance: 18.5 mL/min (A) (by C-G formula based on SCr of 2.64 mg/dL (H)).    Allergies  Allergen Reactions  . Angiotensin Receptor Blockers Other (See Comments)    Reaction:  Hyperkalemia  . Penicillins Rash and Other (See Comments)    rash    Thank you for allowing pharmacy to be a part of this patient's care.  Winifred Balogh L 03/18/2017 11:08 AM

## 2017-03-18 NOTE — Progress Notes (Signed)
  Amiodarone Drug - Drug Interaction Consult Note  Recommendations: No adjustments warranted at this time.   Amiodarone is metabolized by the cytochrome P450 system and therefore has the potential to cause many drug interactions. Amiodarone has an average plasma half-life of 50 days (range 20 to 100 days).   There is potential for drug interactions to occur several weeks or months after stopping treatment and the onset of drug interactions may be slow after initiating amiodarone.   []  Statins: Increased risk of myopathy. Simvastatin- restrict dose to  20mg  daily. Other statins: counsel patients to report any muscle pain or weakness immediately.  []  Anticoagulants: Amiodarone can increase anticoagulant effect. Consider warfarin dose reduction. Patients should be monitored closely and the dose of anticoagulant altered accordingly, remembering that amiodarone levels take several weeks to stabilize.  []  Antiepileptics: Amiodarone can increase plasma concentration of phenytoin, the dose should be reduced. Note that small changes in phenytoin dose can result in large changes in levels. Monitor patient and counsel on signs of toxicity.  []  Beta blockers: increased risk of bradycardia, AV block and myocardial depression. Sotalol - avoid concomitant use.  []   Calcium channel blockers (diltiazem and verapamil): increased risk of bradycardia, AV block and myocardial depression.  []   Cyclosporine: Amiodarone increases levels of cyclosporine. Reduced dose of cyclosporine is recommended.  []  Digoxin dose should be halved when amiodarone is started.  []  Diuretics: increased risk of cardiotoxicity if hypokalemia occurs.  []  Oral hypoglycemic agents (glyburide, glipizide, glimepiride): increased risk of hypoglycemia. Patient's glucose levels should be monitored closely when initiating amiodarone therapy.   []  Drugs that prolong the QT interval:  Torsades de pointes risk may be increased with concurrent  use - avoid if possible.  Monitor QTc, also keep magnesium/potassium WNL if concurrent therapy can't be avoided. Marland Kitchen Antibiotics: e.g. fluoroquinolones, erythromycin. . Antiarrhythmics: e.g. quinidine, procainamide, disopyramide,  sotalol. . Antipsychotics: e.g. phenothiazines, haloperidol.  . Lithium, tricyclic antidepressants, and methadone. Thank You,  Simpson,Michael L  03/18/2017 11:03 AM

## 2017-03-18 NOTE — Progress Notes (Addendum)
[place] [facility] [place] NAME: Jilliane Kazanjian    MR#:  [id number]  DATE OF BIRTH:  08/03/1934  SUBJECTIVE:  Remains Critically sick, getting hemodialysis at bedside.  somewhat alert when I saw her, falls back to sleep easily REVIEW OF SYSTEMS:    Review of Systems  Unable to perform ROS: Acuity of condition   Tolerating Diet: npo DRUG ALLERGIES:   Allergies  Allergen Reactions  . Angiotensin Receptor Blockers Other (See Comments)    Reaction:  Hyperkalemia  . Penicillins Rash and Other (See Comments)    rash    VITALS:  Blood pressure 94/64, pulse (!) 102, temperature 97.4 F (36.3 C), temperature source Oral, resp. rate 11, height 5\' 1"  (1.549 m), weight 105 kg (231 lb 7.7 oz), SpO2 99 %.  PHYSICAL EXAMINATION:   Physical Exam  Constitutional: [name], well-nourished, and in no distress. No distress.  HENT:  Head: Normocephalic.  Eyes: No scleral icterus.  Neck: Neck supple. No JVD present. No tracheal deviation present.  Cardiovascular: Normal rate and normal heart sounds.  Exam reveals no gallop and no friction rub.   No murmur heard. irr, irr  Pulmonary/Chest: Effort normal and breath sounds normal. No respiratory distress. She has no wheezes. She has no rales. She exhibits no tenderness.  Abdominal: Soft. Bowel sounds are normal. She exhibits no distension and no mass. There is no tenderness. There is no rebound and no guarding.  Musculoskeletal: She exhibits edema.  Left BKA  Neurological:  Lethargic and not very responsive  Skin: Skin is warm. Rash noted. She is not diaphoretic. No erythema.  Psychiatric:  Lethargic   LABORATORY PANEL:   CBC  Recent Labs Lab 03/18/17 0505  WBC 10.0  HGB 9.9*  HCT 30.2*  PLT 119*   ------------------------------------------------------------------------------------------------------------------  Chemistries   Recent Labs Lab 03/15/17 0534  03/18/17 0505  NA 137   < > 137  K 3.5  < > 3.6  CL 99*  < > 101  CO2 28  < > 28  GLUCOSE 97  < > 212*  BUN 15  < > 17  CREATININE 2.17*  < > 2.64*  CALCIUM 8.5*  < > 8.7*  MG 1.7  --   --   < > = values in this interval not displayed. ------------------------------------------------------------------------------------------------------------------  Cardiac Enzymes  Recent Labs Lab 03/15/17 1241 03/15/17 1823 03/16/17 0435  TROPONINI 0.04* 0.04* 0.04*   ------------------------------------------------------------------------------------------------------------------  RADIOLOGY:  Dg Chest Port 1 View  Result Date: 03/18/2017 CLINICAL DATA:  Pulmonary vascular congestion EXAM: PORTABLE CHEST 1 VIEW COMPARISON:  April 11, 2017 FINDINGS: Central catheter tip is in the superior vena cava. No pneumothorax. There is partially loculated pleural effusion on the left with atelectasis in the left base. There is linear atelectasis in the right base. There is no appreciable pulmonary edema. There is stable cardiomegaly with pulmonary venous hypertension. There is evidence of mitral annulus calcification. There is aortic atherosclerosis. No adenopathy. Multiple rib fractures noted on the left, unchanged. IMPRESSION: Pulmonary vascular congestion. Pleural effusion on the left, partially loculated, stable. Pulmonary vascular congestion without appreciable edema. There is bibasilar atelectasis. There are rib fractures on the left. The fracture of the lateral left fifth rib appears recent. No pneumothorax. The overall appearance of the chest is similar compared to most recent study. Electronically Signed   By: Lowella Grip III M.D.   On: 03/18/2017 07:43     ASSESSMENT AND PLAN:  83 year old  female with ESRD postop left below-knee amputation for left foot ulceration/osteomyelitis.  1. Acute Metabolic encephalopathy due to ICU delirium and post op delirium/hypotension related? Patient has declined significantly   Palliative care is talking daily with family - not quite ready yet, daily meeting planned - Son seemed to be in disagreement with patient's husband decision to keep her comfortable at home and son wants everything to be done  2. Postop hypotension on baseline chronic hypotension: unable to take in any oral medications due to mental status  - Patient on Neo-Synephrine drip to maintain blood pressure  3. Postoperative day #8 left below the knee amputation: Management as per surgery  4 ESRD on hemodialysis as per nephrology Dialysis per nephrology, getting one today  5. A. fib with RVR,: Due to low BP BB stopped. Tachycardic in 100s at rest - amiodarone drip per PCCM -Continue midodrine wean hydrocortisone, - Also on metoprolol for rate control as needed  6. Diabetes: Continue sliding scale.   7. RLE chronic nonhealing wounds: :Cleanse right leg with soap and water. Aquacel Ag to wound bed. Cover with kerlix layer from below toes to below knee. Secure with Coban layer. Change three times weekly. Mon/Wed/Fri.   Difficult situation as family not on same page.  Son does want everything to be done versus her husband wanting to keep her comfortable at home.  Possible family meeting next Tuesday   CODE STATUS: DNR  crititcal care TOTAL TIME TAKING CARE OF THIS PATIENT: 15 minutes.     POSSIBLE D/C ?, DEPENDING ON CLINICAL CONDITION.   Max Sane M.D on 03/18/2017 at 6:01 PM  Between 7am to 6pm - Pager - [phone] After 6pm go to [email or web address] - password [email or web address] [facility]  Office  [phone]  CC: Primary care physician; Rusty Aus, MD  Note: This dictation was prepared with Dragon dictation along with smaller phrase technology. Any transcriptional errors that result from this process are unintentional.

## 2017-03-18 NOTE — Progress Notes (Signed)
PT Cancellation Note  Patient Details Name: [name] MRN: [id number] DOB: [birth year]   Cancelled Treatment:    Reason Eval/Treat Not Completed: Other (comment). Pt not medically stable with trending down BP from 97/55; 92/54; and now 85/51 this afternoon. Pt also now on amiodarone drip with elevated HR. Will hold therapy this date. Will re-attempt when medically stable.   Jayani Rozman 03/18/2017, 2:55 PM  Greggory Stallion, PT, DPT [phone]

## 2017-03-18 NOTE — Progress Notes (Signed)
* [facility]     Assessment and Plan:  IMPRESSION: ESRD Peripheral arterial disease S/P L BKA Chronic hypotension with systolics in the 56-387 range; weaning phenylephrine Chronic atrial fibrillation currently in RVR Acute Encephalopathy due to ?ICU/Post Op delirium vs. hypotension DNR/DNI Echo results; c/w with pulmonary hypertension, likely related to underlying medical comorbidities.   PLAN/REC:  Amiodarone gtt for rate control  Continue prn metoprolol for hr >120 Continue neosynephrine gtt to maintain map >56 and/or systolic >43, wean off as tolerated Cont current abx, empirically for possible septic shock. Continue midodrine once pt is able to tolerate po medications wean hydrocortisone, hold home dosing of propranolol for now due to continued hypotension.  HD per nephro.  DNR/DNI remains appropriate Palliative Care consulted appreciate input Prn Morphine for pain management  Currently on cefepime 4/1>> MRSA PCR, staph aureus PCR +3/22  Date: 03/18/2017  MRN# [id number] [name] March 17, 1934  Subjective Patient appears more awake and alert today, still lethargic and fatigued.  Medication:   Reviewed  Allergies:  Angiotensin receptor blockers and Penicillins  Review of Systems: Could not be obtained due to lethargy   Physical Examination:   VS: BP 98/62   Pulse (!) 113   Temp 97.5 F (36.4 C) (Oral)   Resp 17   Ht 5\' 1"  (1.549 m)   Wt 228 lb 13.4 oz (103.8 kg)   SpO2 100%   BMI 43.24 kg/m   General Appearance: well developed, well nourished, NAD Neuro: worsening lethargy, opens eyes only to voice, not follow commands, PERRL HEENT: supple, no JVD Pulmonary: diminished throughout, even, non labored   Cardiovascular: irregular, irregular, no M/R/G   Abdomen: Benign, Soft, non-tender, hypoactive BS x4 Skin: Warm, left BKA incision dressing with dried drainage, una boot present right lower extremity  Extremities: left BKA, trace  generalized edema   LABORATORY PANEL:   CBC  Recent Labs Lab 03/18/17 0505  WBC 10.0  HGB 9.9*  HCT 30.2*  PLT 119*   ------------------------------------------------------------------------------------------------------------------  Chemistries   Recent Labs Lab 03/15/17 0534  03/18/17 0505  NA 137  < > 137  K 3.5  < > 3.6  CL 99*  < > 101  CO2 28  < > 28  GLUCOSE 97  < > 212*  BUN 15  < > 17  CREATININE 2.17*  < > 2.64*  CALCIUM 8.5*  < > 8.7*  MG 1.7  --   --   < > = values in this interval not displayed. ------------------------------------------------------------------------------------------------------------------  Cardiac Enzymes  Recent Labs Lab 03/16/17 0435  TROPONINI 0.04*   ------------------------------------------------------------  RADIOLOGY:   No results found for this or any previous visit. Results for orders placed during the hospital encounter of 03/03/17  Chest 2 View   Narrative CLINICAL DATA:  Preop.  EXAM: CHEST  2 VIEW  COMPARISON:  09/26/2016  FINDINGS: Cardiomegaly. Low lung volumes with bibasilar atelectasis or infiltrates, left greater than right. No effusions. No overt edema. No acute bony abnormality.  IMPRESSION: Low lung volumes with bibasilar atelectasis or infiltrates, left greater than right.  Cardiomegaly.   Electronically Signed   By: Rolm Baptise M.D.   On: 03/03/2017 09:53    -Update 4/5: Spoke with pts husband 04/5 regarding plan of care he stated he does not want his wife to suffer he is comfortable with her going home with hospice with the focus being comfort and the pt spending quality time with her family at the end of her  life. However he would like input from the pts son's.  The pt has a son that is local his name is [name] I spoke with him via telephone today 04/5 he stated he is not ready for the pt to go home with hospice with comfort care as the focus he would like his mother to continue  hemodialysis and other treatment needed as tolerated. He stated "If God is ready for her than the pt will pass away regardless of the treatment the pt receives during this hospitalization."  According to Mr. [name] the pt had a similar episode 1 yr ago and her condition improved despite physicians telling him her prognosis was poor at that time.  He is agreeable to speak with his siblings regarding the current plan of care and the pts current status.  I informed Mr. [name] if there are any s/sx his mother is in pain and/or suffering we will need to have another conversation regarding the plan of care he is agreeable to this.  I informed Mr. [name] of the conversation I had with [name] he stated he does not want the pts son's to know he is ok with the pt going home with hospice.  He stated "there is nothing else I can do at this time."  He understands he can make the decision to transition the pt home with hospice without the pts son's permission, however he does not feel comfortable with making that decision without the son's approval.   [name], M.D.  03/18/2017

## 2017-03-18 NOTE — Progress Notes (Signed)
This note also relates to the following rows which could not be included: Pulse Rate - Cannot attach notes to unvalidated device data Resp - Cannot attach notes to unvalidated device data BP - Cannot attach notes to unvalidated device data SpO2 - Cannot attach notes to unvalidated device data  Hd start  

## 2017-03-18 NOTE — Progress Notes (Signed)
Pt complained her chest felt like it was racing away and would stop. Inquired if she was still on warfarin. Told pt she is on heparin (pt on sq heparin). Pt is on neo gtt but needs beta blocker to slow down HR. BP 964 systolically. Prn metoprolol administered at this time. Will continue to monitor.

## 2017-03-18 NOTE — Care Management (Signed)
Received message from Dr. Juanell Fairly that family is considering LTAC if it is an option. I have requested both LTACs to review patient for appropriateness [facility]). RNCM will continue to follow.

## 2017-03-18 NOTE — Progress Notes (Signed)
Pt wanted to call husband and called 911 instead. ED thought she told them she could not breath, but she was trying to tell them she couldn't get her husband. Pt said she did not say she could not breath. Pt talks hesitantly and sounds as if she can't breath, but she said she is not having trouble breathing. Tried to call husband for patient and the call went to voice mail twice. NT in room with nurse when call placed. Pt upset and thinks staff is trying to prevent her from speaking with husband, but he is not answering the phone. The nurse told patient that her husband called when the nurse was in another room to inquire about her condition, and stated he would see her tomorrow.

## 2017-03-18 NOTE — Progress Notes (Signed)
Daily Progress Note   Patient Name: [name]       Date: 03/18/2017 DOB: Jun 17, 1934  Age: 83 y.o. MRN#: [id number] Attending Physician: Max Sane, MD Primary Care Physician: Rusty Aus, MD Admit Date: 03/10/2017  Reason for Consultation/Follow-up: Establishing goals of care and Withdrawal of life-sustaining treatment  Subjective: Patient drowsy this afternoon. On CPAP and back on pressors. Plan for HD today.   Husband irritable today. He understands a decision needs to be made but will not make this decision alone. Family from [facility] will likely be in town Monday or Tuesday. Son, Patrick [place], does not want hospice services. Husband seems afraid to talk with Patrick Jupiter about his decision to take her home to make her comfortable. I asked for other phone numbers and offered to call other family members to discuss but Mr. Kochanski declines. States "they won't all get along." Attempted to ask the best time to meet with family next week and he states "I don't know." Offered my continued support in navigating these decisions.     Length of Stay: 8  Current Medications: Scheduled Meds:  . albumin human  12.5 g Intravenous Once in dialysis  . docusate sodium  100 mg Oral BID  . epoetin (EPOGEN/PROCRIT) injection  4,000 Units Intravenous Q M,W,F-HD  . feeding supplement (NEPRO CARB STEADY)  237 mL Oral BID BM  . heparin  5,000 Units Subcutaneous Q8H  . hydrocortisone sod succinate (SOLU-CORTEF) inj  25 mg Intravenous Q12H  . insulin aspart  0-9 Units Subcutaneous TID WC  . mouth rinse  15 mL Mouth Rinse BID  . megestrol  40 mg Oral Daily  . midodrine  10 mg Oral TID  . multivitamin  1 tablet Oral Daily  . pantoprazole  40 mg Oral Daily    Continuous Infusions: . amiodarone 30 mg/hr (03/18/17 1335)  .  phenylephrine (NEO-SYNEPHRINE) Adult infusion 4 mcg/min (03/18/17 1307)    PRN Meds: acetaminophen **OR** acetaminophen, alum & mag hydroxide-simeth, fentaNYL (SUBLIMAZE) injection, guaiFENesin-dextromethorphan, ipratropium-albuterol, magnesium sulfate 1 - 4 g bolus IVPB, metoprolol, morphine injection, oxyCODONE-acetaminophen, phenol, polyethylene glycol, promethazine, senna, sorbitol  Physical Exam  Constitutional: She is easily aroused. She appears ill.  HENT:  Head: Normocephalic and atraumatic.  Cardiovascular: An irregularly irregular rhythm present.  Pulmonary/Chest: Effort normal. She has decreased breath sounds.  Abdominal: Normal appearance. There is no tenderness.  Neurological: She is easily aroused.  Skin: Skin is warm and dry. There is pallor.  Psychiatric: Her speech is delayed. She is slowed. She is inattentive.  Nursing note and vitals reviewed.          Vital Signs: BP (!) 96/50   Pulse (!) 107   Temp 97.9 F (36.6 C) (Axillary)   Resp (!) 8   Ht 5\' 1"  (1.549 m)   Wt 103.8 kg (228 lb 13.4 oz)   SpO2 (!) 89%   BMI 43.24 kg/m  SpO2: SpO2: (!) 89 % O2 Device: O2 Device: CPAP O2 Flow Rate: O2 Flow Rate (L/min): 2 L/min  Intake/output summary:   Intake/Output Summary (Last 24 hours) at 03/18/17 1404 Last data filed at 03/18/17 1307  Gross per 24 hour  Intake           1237.3 ml  Output                1 ml  Net           1236.3 ml   LBM: Last BM Date: 03/17/17 Baseline Weight: Weight: 106.1 kg (234 lb) Most recent weight: Weight: 103.8 kg (228 lb 13.4 oz)       Palliative Assessment/Data: PPS 30%   Flowsheet Rows     Most Recent Value  Intake Tab  Referral Department  Critical care  Unit at Time of Referral  ICU  Palliative Care Primary Diagnosis  Nephrology  Date Notified  03/15/17  Palliative Care Type  Return patient Palliative Care  Reason for referral  Clarify Goals of Care  Date of Admission  03/10/17  Date first seen by Palliative Care   03/16/17  # of days Palliative referral response time  1 Day(s)  # of days IP prior to Palliative referral  5  Clinical Assessment  Palliative Performance Scale Score  30%  Psychosocial & Spiritual Assessment  Palliative Care Outcomes  Patient/Family meeting held?  Yes  Who was at the meeting?  patient, husband, son  Palliative Care Outcomes  Clarified goals of care, Provided psychosocial or spiritual support, Provided end of life care assistance, Counseled regarding hospice, ACP counseling assistance      Patient Active Problem List   Diagnosis Date Noted  . Persistent atrial fibrillation ([place])   . S/P BKA (below knee amputation), left ([place])   . Goals of care, counseling/discussion   . Septic shock ([place])   . Osteomyelitis of ankle or foot, left, acute ([place]) 03/10/2017  . Osteomyelitis of ankle or foot, acute, left ([place]) 03/10/2017  . Ulceration ([place]) 12/14/2016  . Lymphedema 11/16/2016  . Pressure injury of skin 09/27/2016  . DVT (deep venous thrombosis) ([place]) 09/26/2016  . Palliative care by specialist   . Closed nondisplaced fracture of lateral condyle of right femur ([place])   . Hyponatremia 09/09/2016  . Hypoxia 04/06/2016  . Healthcare-associated pneumonia 08/14/2015  . Hypoglycemia 08/14/2015  . Hypotension 08/14/2015  . ESRD on hemodialysis ([place]) 08/14/2015  . DM (diabetes mellitus) ([place]) 08/14/2015  . Pleural effusion on right   . Protein-calorie malnutrition, severe ([place]) 08/06/2015  . Community acquired pneumonia   . Pleural effusion 08/03/2015  . Acute respiratory failure ([place]) 08/03/2015  . Cough 08/03/2015  . Atrial fibrillation with RVR ([place]) 08/03/2015    Palliative Care Assessment & Plan   Patient Profile: 83 y.o. female  with past medical history of ESRD on HD, HTN, DM, atrial fibrillation, OSA on  CPAP HS, CHF, GERD, asthma, and anemia admitted on 03/10/2017 for a scheduled left below the knee amputation due to left foot ulceration/osteomyelitis. During  procedure, patient went into afib RVR and became hypotensive requiring pressors. Also, ESRD on HD-not tolerating well due to hypotension and afib RVR. Patient with lethargy on 4/3. Remains critically ill on neo gtt.  Palliative medicine consultation for goals of care.   Assessment: ESRD on HD Afib RVR Hypotension Acute encephalopathy  S/p left BKA RLE chronic nonhealing wounds  Recommendations/Plan:  DNR/DNI  Plan for HD today. Patient back on neo.  Challenging family dynamics and husband will not provide numbers for me to reach out to more family.   Son, [place], refusing hospice.   PMT not at [facility] over the weekend but please contact palliative NP on Monday if family arranges a time to meet. If not, I will be back on Tuesday to attempt further discussions with family.   Goals of Care and Additional Recommendations:  Limitations on Scope of Treatment: Full Scope Treatment-except DNR/DNI  Code Status: DNR/DNI   Code Status Orders        Start     Ordered   03/10/17 0272  Do not attempt resuscitation (DNR)  Continuous    Question Answer Comment  In the event of cardiac or respiratory ARREST Do not call a "code blue"   In the event of cardiac or respiratory ARREST Do not perform Intubation, CPR, defibrillation or ACLS   In the event of cardiac or respiratory ARREST Use medication by any route, position, wound care, and other measures to relive pain and suffering. May use oxygen, suction and manual treatment of airway obstruction as needed for comfort.      03/10/17 1743    Code Status History    Date Active Date Inactive Code Status Order ID Comments User Context   03/10/2017  4:49 PM 03/10/2017  5:43 PM Full Code [id number]  Algernon Huxley, MD Inpatient   09/26/2016  7:27 PM 09/28/2016  9:19 PM DNR [id number]  Idelle Crouch, MD Inpatient   09/11/2016 12:12 PM 09/21/2016  3:50 PM DNR [id number]  Laverle Hobby, MD Inpatient   09/10/2016 12:48 AM 09/10/2016  2:44 PM Full Code  [id number]  Harvie Bridge, DO Inpatient   04/06/2016  4:59 PM 04/10/2016  7:57 PM Full Code [id number]  Epifanio Lesches, MD ED   11/20/2015  3:03 PM 11/20/2015  6:10 PM Full Code [id number]  Algernon Huxley, MD Inpatient   08/14/2015  5:40 AM 08/21/2015 10:45 PM Full Code [id number]  Juluis Mire, MD Inpatient   08/02/2015  1:44 PM 08/11/2015 10:21 PM Full Code [id number]  Gladstone Lighter, MD Inpatient       Prognosis:   Unable to determine-poor with ESRD on HD and not tolerating dialysis (afib RVR/hypotensive on neo gtt)  Discharge Planning:  To Be Determined   Care plan was discussed with husband, Dr. Juanell Fairly, Dr Holley Raring, RN  Thank you for allowing the Palliative Medicine Team to assist in the care of this patient.   Time In: 1325 Time Out: 1350 Total Time 40min Prolonged Time Billed  no      Greater than 50%  of this time was spent counseling and coordinating care related to the above assessment and plan.  Ihor Dow, FNP-C Palliative Medicine Team  Phone: [phone] Fax: [phone]  Please contact Palliative Medicine Team phone at [phone] for questions and concerns.

## 2017-03-19 LAB — CBC
HEMATOCRIT: 31.7 % — AB (ref 35.0–47.0)
Hemoglobin: 10.2 g/dL — ABNORMAL LOW (ref 12.0–16.0)
MCH: 33.1 pg (ref 26.0–34.0)
MCHC: 32.4 g/dL (ref 32.0–36.0)
MCV: 102.1 fL — AB (ref 80.0–100.0)
Platelets: 113 10*3/uL — ABNORMAL LOW (ref 150–440)
RBC: 3.1 MIL/uL — ABNORMAL LOW (ref 3.80–5.20)
RDW: 18.8 % — AB (ref 11.5–14.5)
WBC: 17.7 10*3/uL — AB (ref 3.6–11.0)

## 2017-03-19 LAB — BASIC METABOLIC PANEL
ANION GAP: 10 (ref 5–15)
BUN: 12 mg/dL (ref 6–20)
CALCIUM: 8.5 mg/dL — AB (ref 8.9–10.3)
CO2: 26 mmol/L (ref 22–32)
Chloride: 102 mmol/L (ref 101–111)
Creatinine, Ser: 2.22 mg/dL — ABNORMAL HIGH (ref 0.44–1.00)
GFR calc Af Amer: 23 mL/min — ABNORMAL LOW (ref >60)
GFR calc non Af Amer: 20 mL/min — ABNORMAL LOW (ref >60)
GLUCOSE: 158 mg/dL — AB (ref 65–99)
POTASSIUM: 3.3 mmol/L — AB (ref 3.5–5.1)
Sodium: 138 mmol/L (ref 135–145)

## 2017-03-19 LAB — GLUCOSE, CAPILLARY
GLUCOSE-CAPILLARY: 152 mg/dL — AB (ref 65–99)
GLUCOSE-CAPILLARY: 159 mg/dL — AB (ref 65–99)
Glucose-Capillary: 171 mg/dL — ABNORMAL HIGH (ref 65–99)

## 2017-03-19 MED ORDER — BIOTENE DRY MOUTH MT LIQD: 15.0000 mL | OROMUCOSAL | Status: DC | PRN

## 2017-03-19 MED ORDER — MORPHINE SULFATE (CONCENTRATE) 10 MG/0.5ML PO SOLN: 5.0000 mg | ORAL | Status: DC | PRN

## 2017-03-19 MED ORDER — ACETAMINOPHEN 325 MG PO TABS: 650.0000 mg | ORAL_TABLET | Freq: Four times a day (QID) | ORAL | Status: DC | PRN

## 2017-03-19 MED ORDER — HALOPERIDOL LACTATE 2 MG/ML PO CONC
0.5000 mg | ORAL | Status: DC | PRN
  Filled 2017-03-19: qty 0.3

## 2017-03-19 MED ORDER — MORPHINE SULFATE (PF) 4 MG/ML IV SOLN
1.0000 mg | INTRAVENOUS | Status: DC | PRN
  Administered 2017-03-19: 20:00:00 1 mg via INTRAVENOUS
  Filled 2017-03-19: qty 1

## 2017-03-19 MED ORDER — ONDANSETRON 4 MG PO TBDP
4.0000 mg | ORAL_TABLET | Freq: Four times a day (QID) | ORAL | Status: DC | PRN
Start: 2017-03-19 — End: 2017-03-20
  Filled 2017-03-19: qty 1

## 2017-03-19 MED ORDER — POLYVINYL ALCOHOL 1.4 % OP SOLN
1.0000 [drp] | Freq: Four times a day (QID) | OPHTHALMIC | Status: DC | PRN
  Filled 2017-03-19: qty 15

## 2017-03-19 MED ORDER — HALOPERIDOL LACTATE 5 MG/ML IJ SOLN: 0.5000 mg | INTRAMUSCULAR | Status: DC | PRN

## 2017-03-19 MED ORDER — ACETAMINOPHEN 650 MG RE SUPP
650.0000 mg | Freq: Four times a day (QID) | RECTAL | Status: DC | PRN
Start: 2017-03-19 — End: 2017-03-20

## 2017-03-19 MED ORDER — GLYCOPYRROLATE 0.2 MG/ML IJ SOLN: 0.2000 mg | INTRAMUSCULAR | Status: DC | PRN

## 2017-03-19 MED ORDER — GLYCOPYRROLATE 1 MG PO TABS: 1.0000 mg | ORAL_TABLET | ORAL | Status: DC | PRN

## 2017-03-19 MED ORDER — LORAZEPAM 2 MG/ML IJ SOLN
1.0000 mg | INTRAMUSCULAR | Status: DC | PRN
  Administered 2017-03-19 – 2017-03-20 (×2): 1 mg via INTRAVENOUS
  Filled 2017-03-19 (×2): qty 1

## 2017-03-19 MED ORDER — LORAZEPAM 2 MG/ML PO CONC
1.0000 mg | ORAL | Status: DC | PRN
Start: 2017-03-19 — End: 2017-03-20

## 2017-03-19 MED ORDER — ALPRAZOLAM 0.5 MG PO TABS: 0.2500 mg | ORAL_TABLET | Freq: Three times a day (TID) | ORAL | Status: DC | PRN

## 2017-03-19 MED ORDER — HALOPERIDOL 0.5 MG PO TABS
0.5000 mg | ORAL_TABLET | ORAL | Status: DC | PRN
  Filled 2017-03-19: qty 1

## 2017-03-19 MED ORDER — ONDANSETRON HCL 4 MG/2ML IJ SOLN
4.0000 mg | Freq: Four times a day (QID) | INTRAMUSCULAR | Status: DC | PRN
Start: 2017-03-19 — End: 2017-03-20

## 2017-03-19 MED ORDER — LORAZEPAM 1 MG PO TABS: 1.0000 mg | ORAL_TABLET | ORAL | Status: DC | PRN

## 2017-03-19 NOTE — Progress Notes (Signed)
Subjective:  Patient seen at bedside. She did not tolerate hemodialysis very well at all yesterday. Ultrafiltration achieved was only 300 cc. Family and patient opted for comfort care at this point in time. Therefore she will be moved to 1C.    Objective:  Vital signs in last 24 hours:  Temp:  [97.3 F (36.3 C)-97.9 F (36.6 C)] 97.9 F (36.6 C) (04/07 1230) Pulse Rate:  [94-138] 125 (04/07 1230) Resp:  [1-27] 18 (04/07 1230) BP: (66-142)/(40-107) 104/63 (04/07 1230) SpO2:  [89 %-100 %] 100 % (04/07 1230) Weight:  [103 kg (227 lb 1.2 oz)-105 kg (231 lb 7.7 oz)] 103 kg (227 lb 1.2 oz) (04/06 2000)  Weight change:  Filed Weights   03/18/17 1540 03/18/17 1910 03/18/17 2000  Weight: 105 kg (231 lb 7.7 oz) 104.3 kg (229 lb 15 oz) 103 kg (227 lb 1.2 oz)    Intake/Output:    Intake/Output Summary (Last 24 hours) at 03/19/17 1255 Last data filed at 03/19/17 1200  Gross per 24 hour  Intake           1517.3 ml  Output              386 ml  Net           1131.3 ml     Physical Exam: General: No acute distress  HEENT Moist oral mucus membranes  Neck supple  Pulm/lungs Normal effort, clear to auscultation  CVS/Heart Irregular, A Fib, tachycardic  Abdomen:  Soft, NTND  Extremities: Left BKA, Rt foot in bandage  Neurologic: Awake this a.m., follow simple commands, oriented to self and place.   Access: Left forearm AVF          Basic Metabolic Panel:   Recent Labs Lab 03/13/17 0505  03/15/17 0534 03/16/17 0435 03/17/17 0500 03/18/17 0505 03/18/17 1551 03/19/17 0517  NA 133*  < > 137 138 136 137  --  138  K 3.8  < > 3.5 3.6 3.5 3.6  --  3.3*  CL 99*  < > 99* 103 100* 101  --  102  CO2 25  < > 28 25 28 28   --  26  GLUCOSE 143*  < > 97 156* 235* 212*  --  158*  BUN 17  < > 15 17 14 17   --  12  CREATININE 2.56*  < > 2.17* 2.54* 2.21* 2.64*  --  2.22*  CALCIUM 8.4*  < > 8.5* 8.3* 8.4* 8.7*  --  8.5*  MG  --   --  1.7  --   --   --   --   --   PHOS 3.2  --   --    --   --   --  2.5  --   < > = values in this interval not displayed.   CBC:  Recent Labs Lab 03/15/17 0534 03/16/17 0435 03/17/17 0500 03/18/17 0505 03/19/17 0517  WBC 9.2 9.0 9.0 10.0 17.7*  NEUTROABS  --   --   --  8.1*  --   HGB 9.4* 9.5* 9.5* 9.9* 10.2*  HCT 28.4* 29.1* 29.0* 30.2* 31.7*  MCV 102.2* 101.2* 101.5* 104.1* 102.1*  PLT 153 175 143* 119* 113*      Microbiology:  Recent Results (from the past 720 hour(s))  Aerobic Culture (superficial specimen)     Status: None   Collection Time: 02/22/17  3:50 PM  Result Value Ref Range Status   Specimen Description FOOT LEFT  Final  Special Requests CALCANEUS  Final   Gram Stain   Final    RARE WBC PRESENT, PREDOMINANTLY PMN ABUNDANT GRAM POSITIVE COCCI IN PAIRS ABUNDANT GRAM NEGATIVE COCCOBACILLI FEW YEAST Performed at [facility], [place] [address] [place],  [address]    Culture   Final    ABUNDANT KLEBSIELLA PNEUMONIAE ABUNDANT ENTEROCOCCUS FAECALIS    Report Status 02/27/2017 FINAL  Final   Organism ID, Bacteria KLEBSIELLA PNEUMONIAE  Final   Organism ID, Bacteria ENTEROCOCCUS FAECALIS  Final      Susceptibility   Enterococcus faecalis - MIC*    AMPICILLIN <=2 SENSITIVE Sensitive     VANCOMYCIN 1 SENSITIVE Sensitive     GENTAMICIN SYNERGY RESISTANT Resistant     * ABUNDANT ENTEROCOCCUS FAECALIS   Klebsiella pneumoniae - MIC*    AMPICILLIN >=32 RESISTANT Resistant     CEFAZOLIN <=4 SENSITIVE Sensitive     CEFEPIME <=1 SENSITIVE Sensitive     CEFTAZIDIME <=1 SENSITIVE Sensitive     CEFTRIAXONE <=1 SENSITIVE Sensitive     CIPROFLOXACIN <=0.25 SENSITIVE Sensitive     GENTAMICIN <=1 SENSITIVE Sensitive     IMIPENEM <=0.25 SENSITIVE Sensitive     TRIMETH/SULFA <=20 SENSITIVE Sensitive     AMPICILLIN/SULBACTAM 4 SENSITIVE Sensitive     PIP/TAZO <=4 SENSITIVE Sensitive     Extended ESBL NEGATIVE Sensitive     * ABUNDANT KLEBSIELLA PNEUMONIAE  Surgical pcr screen     Status: Abnormal    Collection Time: 03/03/17  8:45 AM  Result Value Ref Range Status   MRSA, PCR POSITIVE (A) NEGATIVE Final    Comment: RESULT CALLED TO, READ BACK BY AND VERIFIED WITH: GAIL GRAVES 03/03/17 1140 SGD    Staphylococcus aureus POSITIVE (A) NEGATIVE Final    Comment:        The Xpert SA Assay (FDA approved for NASAL specimens in patients over 85 years of age), is one component of a comprehensive surveillance program.  Test performance has been validated by [facility] for patients greater than or equal to 62 year old. It is not intended to diagnose infection nor to guide or monitor treatment.     Coagulation Studies: No results for input(s): LABPROT, INR in the last 72 hours.  Urinalysis: No results for input(s): COLORURINE, LABSPEC, PHURINE, GLUCOSEU, HGBUR, BILIRUBINUR, KETONESUR, PROTEINUR, UROBILINOGEN, NITRITE, LEUKOCYTESUR in the last 72 hours.  Invalid input(s): APPERANCEUR    Imaging: Dg Chest Port 1 View  Result Date: 03/18/2017 CLINICAL DATA:  Pulmonary vascular congestion EXAM: PORTABLE CHEST 1 VIEW COMPARISON:  April 11, 2017 FINDINGS: Central catheter tip is in the superior vena cava. No pneumothorax. There is partially loculated pleural effusion on the left with atelectasis in the left base. There is linear atelectasis in the right base. There is no appreciable pulmonary edema. There is stable cardiomegaly with pulmonary venous hypertension. There is evidence of mitral annulus calcification. There is aortic atherosclerosis. No adenopathy. Multiple rib fractures noted on the left, unchanged. IMPRESSION: Pulmonary vascular congestion. Pleural effusion on the left, partially loculated, stable. Pulmonary vascular congestion without appreciable edema. There is bibasilar atelectasis. There are rib fractures on the left. The fracture of the lateral left fifth rib appears recent. No pneumothorax. The overall appearance of the chest is similar compared to most recent study.  Electronically Signed   By: Lowella Grip III M.D.   On: 03/18/2017 07:43     Medications:   . amiodarone 30 mg/hr (03/19/17 1200)   . albumin human  12.5 g  Intravenous Once in dialysis  . docusate sodium  100 mg Oral BID  . feeding supplement (NEPRO CARB STEADY)  237 mL Oral BID BM  . hydrocortisone sod succinate (SOLU-CORTEF) inj  25 mg Intravenous Q12H  . insulin aspart  0-9 Units Subcutaneous TID WC   acetaminophen **OR** acetaminophen, ALPRAZolam, alum & mag hydroxide-simeth, antiseptic oral rinse, fentaNYL (SUBLIMAZE) injection, glycopyrrolate **OR** glycopyrrolate **OR** glycopyrrolate, guaiFENesin-dextromethorphan, haloperidol **OR** haloperidol **OR** haloperidol lactate, ipratropium-albuterol, LORazepam **OR** LORazepam **OR** LORazepam, magnesium sulfate 1 - 4 g bolus IVPB, metoprolol, morphine injection, morphine injection, morphine CONCENTRATE **OR** morphine CONCENTRATE, ondansetron **OR** ondansetron (ZOFRAN) IV, phenol, polyethylene glycol, polyvinyl alcohol, promethazine, senna, sorbitol  Assessment/ Plan:  83 y.o. female   with end-stage renal disease on hemodialysis, diabetes mellitus type 2, hypertension, obstructive sleep apnea, atrial fibrillation, arthritis, anemia, distal right femoral fracture, admitted on 09/26/2016 for Leg edema [R60.0] Venous thrombosis [I82.90] ,rt leg DVT  MWF [facility]  1. End-stage renal disease-  MWF -Patient didn't tolerate dialysis very well at all yesterday. This is despite use of pressors. At this point in time patient and family would like to opt for comfort care. Therefore we will discontinue any further dialysis. We plan to move her to 1C and implement comfort care measures.  In addition she will need hospice referral as the patient wants to go home. We will consult with care management to make this possible. Apparently she already has a hospital bed in the home.  2.  Hypotension:  - Discontinue phenylephrine as  above.   3. Anemia of chronic kidney disease - We will discontinue any further use of Epogen.  4. Secondary hyperparathyroidism:   No indication to check, calcium, phosphorus, or PTH any further.  5. LE edema May increase now that we are stopping dialysis.   LOS: [place], Braylon Grenda 4/7/201812:55 PM

## 2017-03-19 NOTE — Plan of Care (Signed)
Problem: Health Behavior/Discharge Planning: Goal: Ability to manage health-related needs will improve Outcome: Progressing Pt has been complaining about wanting to go home most of the shift. Has been very confused and gets anxious at times. HR has been afib with periods of rapid response. Received prn metoprolol x 2. Had morphine x 2. Stated her heart felt as if it would stop and she could feel it racing on and off. On Amiodarone gtt as well. Continue to monitor.   Problem: Skin Integrity: Goal: Risk for impaired skin integrity will decrease Outcome: Progressing Turned and repositioned q 2 for comfort and to prevent pressure areas. Had BM earlier in shift, buttocks and sacrum red. Not blanching. Abdominal folds red and cracked from moisture. Excoriation on abdomen from blood thinners per observation. Stump dressing inner dressing intact, outer guaze was soiled and fell off so redressed. Una boot on right foot intact, changed every Mon, Wed, Friday by wound care nurse. Continue to monitor.

## 2017-03-19 NOTE — Progress Notes (Signed)
Pt transitioned to comfort care at this time.

## 2017-03-19 NOTE — Clinical Social Work Note (Signed)
Patient has chosen hospice at home for care. CSW signing off. Please consult should any needs arise or if family changes mind to hospice facility request.  Santiago Bumpers, MSW, [place]

## 2017-03-19 NOTE — Progress Notes (Signed)
Patient transferred from ICU to room 122. On assessment patient complains of phantom pain in left leg however denies morphine at this time. Will continue to monitor. Ammie Dalton, RN

## 2017-03-19 NOTE — Progress Notes (Signed)
Pt transferred in stable condition to room 122. Pt transferred on 2L [place]. All belongings with patient. Pt family. Report given to Allie Bossier, RN

## 2017-03-19 NOTE — Progress Notes (Addendum)
* [facility]     Assessment and Plan:  IMPRESSION: ESRD Peripheral arterial disease S/P L BKA Chronic hypotension with systolics in the 26-834 range; weaning phenylephrine Chronic atrial fibrillation currently in RVR Acute Encephalopathy due to ?ICU/Post Op delirium vs. hypotension DNR/DNI Echo results; c/w with pulmonary hypertension, likely related to underlying medical comorbidities.   PLAN/REC:  Amiodarone gtt for rate control; convert to PO when able.  Continue prn metoprolol for hr >120 Again Continue neosynephrine gtt to maintain map >19 and/or systolic >62, wean off as tolerated Abx course complete.  Continue midodrine once pt is able to tolerate po medications wean hydrocortisone, hold home dosing of propranolol for now due to continued hypotension.  HD per nephro.  DNR/DNI remains appropriate Palliative Care consulted appreciate input Prn Morphine for pain management   Currently on cefepime 4/1>> 4/6 MRSA PCR, staph aureus PCR +3/22  Advanced care planning: --I spoke with the patient's son, Patrick Jupiter, who has been involved with his mother's care. At this time, he states that he is starting to realize that his mother is not going to get better, he has asked his mother whether she is ready to go to heaven, and she has indicated that yes she is. Therefore, he is now open to the idea of the patient going home with hospice, I did advise him that this would be our recommendation. I Explained that we have reached the end of what can be done in the hospital and if she wants continued aggressive care the patient would need to be transferred to an [facility]. At this time. She cannot be transferred to the general medical floor, due to the continued pressor requirements. He expressed that he does not like [facility] as he notes that "they are places for patients to go and die". I explained that this would be the likely scenario should his mother be transferred to  one of the centers, or she might get worse and have to be transferred back to the intensive care unit. He tells me that there are 3 other living natural children of the patient, he is going to get in touch with them and let them know of the situation. He raised objections to the idea that the patient has 10 children, as the patient only has 5 natural born children, and one of them is passed.  The patient's natural born children can be updated in a family meeting. Time spent in discussion, 30 minutes. Marda Stalker, M.D. 11:31 AM    Date: 03/19/2017  MRN# [id number] [name] [birth year]  Subjective Patient appears more awake and alert today, still lethargic and fatigued.  Medication:   Reviewed  Allergies:  Angiotensin receptor blockers and Penicillins  Review of Systems: Could not be obtained due to lethargy   Physical Examination:   VS: BP 102/72 Comment: cuff repositioned  Pulse (!) 131   Temp 97.5 F (36.4 C) (Oral)   Resp (!) 23   Ht 5\' 1"  (1.549 m)   Wt 227 lb 1.2 oz (103 kg)   SpO2 98%   BMI 42.91 kg/m   General Appearance: well developed, well nourished, NAD Neuro: sleepy but awake, follows basic commands PERRL HEENT: supple, no JVD Pulmonary: diminished throughout, even, non labored   Cardiovascular: irregular, irregular, no M/R/G   Abdomen: Benign, Soft, non-tender, hypoactive BS x4 Skin: Warm, left BKA incision dressing with dried drainage, una boot present right lower extremity  Extremities: left BKA, trace generalized edema   LABORATORY  PANEL:   CBC  Recent Labs Lab 03/19/17 0517  WBC 17.7*  HGB 10.2*  HCT 31.7*  PLT 113*   ------------------------------------------------------------------------------------------------------------------  Chemistries   Recent Labs Lab 03/15/17 0534  03/19/17 0517  NA 137  < > 138  K 3.5  < > 3.3*  CL 99*  < > 102  CO2 28  < > 26  GLUCOSE 97  < > 158*  BUN 15  < > 12  CREATININE 2.17*  < > 2.22*   CALCIUM 8.5*  < > 8.5*  MG 1.7  --   --   < > = values in this interval not displayed. ------------------------------------------------------------------------------------------------------------------  Cardiac Enzymes  Recent Labs Lab 03/16/17 0435  TROPONINI 0.04*   ------------------------------------------------------------  RADIOLOGY:   No results found for this or any previous visit. Results for orders placed during the hospital encounter of 03/03/17  Chest 2 View   Narrative CLINICAL DATA:  Preop.  EXAM: CHEST  2 VIEW  COMPARISON:  09/26/2016  FINDINGS: Cardiomegaly. Low lung volumes with bibasilar atelectasis or infiltrates, left greater than right. No effusions. No overt edema. No acute bony abnormality.  IMPRESSION: Low lung volumes with bibasilar atelectasis or infiltrates, left greater than right.  Cardiomegaly.   Electronically Signed   By: Rolm Baptise M.D.   On: 03/03/2017 09:53    -Family discussion 4/5: Spoke with pts husband 04/5 regarding plan of care he stated he does not want his wife to suffer he is comfortable with her going home with hospice with the focus being comfort and the pt spending quality time with her family at the end of her life. However he would like input from the pts son's.  The pt has a son that is local his name is [name] I spoke with him via telephone today 04/5 he stated he is not ready for the pt to go home with hospice with comfort care as the focus he would like his mother to continue hemodialysis and other treatment needed as tolerated. He stated "If God is ready for her than the pt will pass away regardless of the treatment the pt receives during this hospitalization."  According to Mr. [name] the pt had a similar episode 1 yr ago and her condition improved despite physicians telling him her prognosis was poor at that time.  He is agreeable to speak with his siblings regarding the current plan of care and the pts  current status.  I informed Mr. [name] if there are any s/sx his mother is in pain and/or suffering we will need to have another conversation regarding the plan of care he is agreeable to this.  I informed Mr. Hayashida of the conversation I had with [name] he stated he does not want the pts son's to know he is ok with the pt going home with hospice.  He stated "there is nothing else I can do at this time."  He understands he can make the decision to transition the pt home with hospice without the pts son's permission, however he does not feel comfortable with making that decision without the son's approval.   Marda Stalker, M.D.  03/19/2017

## 2017-03-19 NOTE — Progress Notes (Signed)
[place] [facility] [place] NAME: [name]    MR#:  [id number]  DATE OF BIRTH:  Feb 26, 1934  SUBJECTIVE:  Pt confused keeps asking me What is wrong with her. Hr still elevated   REVIEW OF SYSTEMS:    Review of Systems  Unable to perform ROS: Acuity of condition   Tolerating Diet: npo DRUG ALLERGIES:   Allergies  Allergen Reactions  . Angiotensin Receptor Blockers Other (See Comments)    Reaction:  Hyperkalemia  . Penicillins Rash and Other (See Comments)    rash    VITALS:  Blood pressure 104/63, pulse (!) 125, temperature 97.9 F (36.6 C), temperature source Oral, resp. rate 18, height 5\' 1"  (1.549 m), weight 227 lb 1.2 oz (103 kg), SpO2 100 %.  PHYSICAL EXAMINATION:   Physical Exam  Constitutional: [name], well-nourished, and in no distress. No distress.  HENT:  Head: Normocephalic.  Eyes: No scleral icterus.  Neck: Neck supple. No JVD present. No tracheal deviation present.  Cardiovascular: Normal rate and normal heart sounds.  Exam reveals no gallop and no friction rub.   No murmur heard. irr, irr  Pulmonary/Chest: Effort normal and breath sounds normal. No respiratory distress. She has no wheezes. She has no rales. She exhibits no tenderness.  Abdominal: Soft. Bowel sounds are normal. She exhibits no distension and no mass. There is no tenderness. There is no rebound and no guarding.  Musculoskeletal: She exhibits edema.  Left BKA  Neurological:  confused  Skin: Skin is warm. Rash noted. She is not diaphoretic. No erythema.  Psychiatric:  confused   LABORATORY PANEL:   CBC  Recent Labs Lab 03/19/17 0517  WBC 17.7*  HGB 10.2*  HCT 31.7*  PLT 113*   ------------------------------------------------------------------------------------------------------------------  Chemistries   Recent Labs Lab 03/15/17 0534  03/19/17 0517  NA 137  < > 138  K 3.5  < > 3.3*  CL 99*  < > 102  CO2 28  < > 26   GLUCOSE 97  < > 158*  BUN 15  < > 12  CREATININE 2.17*  < > 2.22*  CALCIUM 8.5*  < > 8.5*  MG 1.7  --   --   < > = values in this interval not displayed. ------------------------------------------------------------------------------------------------------------------  Cardiac Enzymes  Recent Labs Lab 03/15/17 1241 03/15/17 1823 03/16/17 0435  TROPONINI 0.04* 0.04* 0.04*   ------------------------------------------------------------------------------------------------------------------  RADIOLOGY:  Dg Chest Port 1 View  Result Date: 03/18/2017 CLINICAL DATA:  Pulmonary vascular congestion EXAM: PORTABLE CHEST 1 VIEW COMPARISON:  April 11, 2017 FINDINGS: Central catheter tip is in the superior vena cava. No pneumothorax. There is partially loculated pleural effusion on the left with atelectasis in the left base. There is linear atelectasis in the right base. There is no appreciable pulmonary edema. There is stable cardiomegaly with pulmonary venous hypertension. There is evidence of mitral annulus calcification. There is aortic atherosclerosis. No adenopathy. Multiple rib fractures noted on the left, unchanged. IMPRESSION: Pulmonary vascular congestion. Pleural effusion on the left, partially loculated, stable. Pulmonary vascular congestion without appreciable edema. There is bibasilar atelectasis. There are rib fractures on the left. The fracture of the lateral left fifth rib appears recent. No pneumothorax. The overall appearance of the chest is similar compared to most recent study. Electronically Signed   By: Lowella Grip III M.D.   On: 03/18/2017 07:43     ASSESSMENT AND PLAN:  83 year old female with ESRD postop left below-knee amputation for  left foot ulceration/osteomyelitis.  1. Acute Metabolic encephalopathy due to ICU delirium and post op delirium/hypotension related? No significant improvement  2. Postop hypotension on baseline chronic hypotension: unable to take in  any oral medications due to mental status  -now has been stopped  3. Postoperative day #9left below the knee amputation: Management as per surgery  4 ESRD on hemodialysis as per nephrology I d/w dr. Holley Raring plan for comfort care measure transfer to floor   5. A. fib with RVR,:  Supportive care  6. Diabetes: Continue sliding scale.   7. RLE chronic nonhealing wounds: :Cleanse right leg with soap and water. Aquacel Ag to wound bed. Cover with kerlix layer from below toes to below knee. Secure with Coban layer. Change three times weekly. Mon/Wed/Fri.     CODE STATUS: DNR  crititcal care TOTAL TIME TAKING CARE OF THIS PATIENT: 15 minutes.     POSSIBLE D/C ?, DEPENDING ON CLINICAL CONDITION.   Dustin Flock M.D on 03/19/2017 at 1:37 PM  Between 7am to 6pm - Pager - [phone] After 6pm go to [email or web address] - password [email or web address] [facility]  Office  [phone]  CC: Primary care physician; Rusty Aus, MD  Note: This dictation was prepared with Dragon dictation along with smaller phrase technology. Any transcriptional errors that result from this process are unintentional.

## 2017-03-20 MED ORDER — GLYCOPYRROLATE 1 MG PO TABS: 1.0000 mg | ORAL_TABLET | ORAL | Status: AC | PRN

## 2017-03-20 MED ORDER — MORPHINE SULFATE (CONCENTRATE) 10 MG/0.5ML PO SOLN: 5.0000 mg | ORAL | Status: AC | PRN

## 2017-03-20 MED ORDER — POTASSIUM CHLORIDE CRYS ER 20 MEQ PO TBCR: 20.0000 meq | EXTENDED_RELEASE_TABLET | Freq: Once | ORAL | Status: DC

## 2017-03-20 MED ORDER — LORAZEPAM 1 MG PO TABS: 1.0000 mg | ORAL_TABLET | ORAL | 0 refills | Status: AC | PRN

## 2017-03-20 NOTE — Progress Notes (Signed)
Pt discharged via non-emergent [facility] to [facility] [place]. Writer gave report to RN Robin at [facility]. Patient being discharged on 3L 02 with triple lumen right IJ intact. No distress noted. Ammie Dalton, RN

## 2017-03-20 NOTE — Clinical Social Work Note (Addendum)
CSW has contacted [facility] to alert them that the family has changed decision from home with hospice to hospice facility. CSW has sent referral to [facility], admissions RN. CSW will update as bed referral processed.  [facility], and the family has accepted. CSW will begin the transfer process.  Santiago Bumpers, MSW, [place]anya Presser [phone]

## 2017-03-20 NOTE — Progress Notes (Signed)
[facility] [place] NAME: [name]    MR#:  [id number]  DATE OF BIRTH:  1934-09-28  SUBJECTIVE:  Patient made comfort care yesterday. Received Ativan and morphine earlier. Currently drowsy Has been requests patient be transferred to a hospice home instead of going home with hospice   REVIEW OF SYSTEMS:    Review of Systems  Unable to perform ROS: Acuity of condition   Tolerating Diet: npo DRUG ALLERGIES:   Allergies  Allergen Reactions  . Angiotensin Receptor Blockers Other (See Comments)    Reaction:  Hyperkalemia  . Penicillins Rash and Other (See Comments)    rash    VITALS:  Blood pressure (!) 84/35, pulse (!) 113, temperature 98.2 F (36.8 C), resp. rate 14, height 5\' 1"  (1.549 m), weight 227 lb 1.2 oz (103 kg), SpO2 100 %.  PHYSICAL EXAMINATION:   Physical Exam  Constitutional: She appears distressed.  Comfortable not repsonsive  HENT:  Head: Normocephalic.  Eyes: No scleral icterus.  Neck: Neck supple. No JVD present. No tracheal deviation present.  Cardiovascular: Normal rate and normal heart sounds.  Exam reveals no gallop and no friction rub.   No murmur heard. irr, irr  Pulmonary/Chest: Effort normal and breath sounds normal. No respiratory distress. She has no wheezes. She has no rales. She exhibits no tenderness.  Abdominal: Soft. Bowel sounds are normal. She exhibits no distension and no mass. There is no tenderness. There is no rebound and no guarding.  Musculoskeletal: She exhibits edema.  Left BKA  Neurological:  confused  Skin: Skin is warm. Rash noted. She is not diaphoretic. No erythema.  Psychiatric:  confused   LABORATORY PANEL:   CBC  Recent Labs Lab 03/19/17 0517  WBC 17.7*  HGB 10.2*  HCT 31.7*  PLT 113*   ------------------------------------------------------------------------------------------------------------------  Chemistries   Recent Labs Lab 03/15/17 0534   03/19/17 0517  NA 137  < > 138  K 3.5  < > 3.3*  CL 99*  < > 102  CO2 28  < > 26  GLUCOSE 97  < > 158*  BUN 15  < > 12  CREATININE 2.17*  < > 2.22*  CALCIUM 8.5*  < > 8.5*  MG 1.7  --   --   < > = values in this interval not displayed. ------------------------------------------------------------------------------------------------------------------  Cardiac Enzymes  Recent Labs Lab 03/15/17 1241 03/15/17 1823 03/16/17 0435  TROPONINI 0.04* 0.04* 0.04*   ------------------------------------------------------------------------------------------------------------------  RADIOLOGY:  No results found.   ASSESSMENT AND PLAN:  83 year old female with ESRD postop left below-knee amputation for left foot ulceration/osteomyelitis.  1. Acute Metabolic encephalopathy due to ICU delirium and post op delirium/hypotension related? No significant improvement made confort care  2. Postop hypotension on baseline chronic hypotension: unable to take in any oral medications due to mental status  -supportive care due to comfort care  3. Postoperative day #10left below the knee amputation: Pain control  4 ESRD on hemodialysis as per nephrology no further hd  5. A. fib with RVR,:  Supportive care  6. Diabetes: Continue sliding scale.   7. RLE chronic nonhealing wounds: : Supportive care  confort care    CODE STATUS: DNR  crititcal care TOTAL TIME TAKING CARE OF THIS PATIENT: 25 minutes.     POSSIBLE D/C   DEPENDING ON CLINICAL CONDITION.   Dustin Flock M.D on 03/20/2017 at 12:38 PM  Between 7am to 6pm - Pager - [phone] After 6pm go to [email or web address] -  password [email or web address] [facility]  Office  [phone]  CC: Primary care physician; Rusty Aus, MD  Note: This dictation was prepared with Dragon dictation along with smaller phrase technology. Any transcriptional errors that result from this process are unintentional.

## 2017-03-20 NOTE — Care Management Note (Signed)
Case Management Note  Patient Details  Name: [name] MRN: [id number] Date of Birth: 05-18-1934  Subjective/Objective:       Heads up given to Mariann Laster at [facility] per Ms Casados possibly being discharged home today with hospice services.              Action/Plan:   Expected Discharge Date:                  Expected Discharge Plan:     In-House Referral:     Discharge planning Services  CM Consult  Post Acute Care Choice:    Choice offered to:     DME Arranged:    DME Agency:     HH Arranged:    HH Agency:     Status of Service:     If discussed at [employer] of Stay Meetings, dates discussed:    Additional Comments:  Gwynevere Lizana A, RN 03/20/2017, 8:14 AM

## 2017-03-20 NOTE — Discharge Summary (Signed)
[facility] [place] at [facility], 83 y.o., DOB November 08, 1934, MRN [id number]. Admission date: 03/10/2017 Discharge Date 03/20/2017 Primary MD Rusty Aus, MD Admitting Physician Algernon Huxley, MD  Admission Diagnosis  LEFT ANKLE OSTEOMYLELITIS  Discharge Diagnosis   Active Problems:   Osteomyelitis of ankle or foot, left, acute ([place])   Osteomyelitis of ankle or foot, acute, left ([place])   Septic shock (HCC)   S/P BKA (below knee amputation), left ([place])   Goals of care, counseling/discussion   Persistent atrial fibrillation ([place])   Acute metabolic encephalopathy   Post op hypotension   ESRD   Afib with rvr   DM       Hospital Course  [name]  is a 83 y.o. female with a known history of ESRD, lower extremity edema, atrial fibrillation, peripheral arterial disease admitted to ICU after having left below-knee amputation. Patient has been found to be in atrial fibrillation with rapid ventricular rate and hypotensive. Started on phenylephrine. Pt continued to have persistent hypotension and requring pressors. She also had afib with rvr. She never improved. Bp continued to be low. Finally made comfort care by nephrology. Pt now approved to go to hospices facilty.             Consults  nephrology  Significant Tests:  See full reports for all details     Dg Chest 1 View  Result Date: 03/15/2017 CLINICAL DATA:  83 y/o  F; dyspnea. EXAM: CHEST 1 VIEW COMPARISON:  03/13/2017 chest radiograph. FINDINGS: Stable right central venous catheter tip projecting over mid SVC. Stable vascular congestion. Stable moderate left-sided pleural effusion and left basilar opacity probably representing atelectasis. Multiple acute lateral rib fractures are again seen. Dense mitral annular calcifications. Stable cardiac silhouette given projection and technique. IMPRESSION: Stable vascular congestion, left pleural effusion, and left basilar opacity probably representing  associated atelectasis. Multiple stable left-sided acute rib fractures again seen. Electronically Signed   By: Kristine Garbe M.D.   On: 03/15/2017 05:55   Chest 2 View  Result Date: 03/03/2017 CLINICAL DATA:  Preop. EXAM: CHEST  2 VIEW COMPARISON:  09/26/2016 FINDINGS: Cardiomegaly. Low lung volumes with bibasilar atelectasis or infiltrates, left greater than right. No effusions. No overt edema. No acute bony abnormality. IMPRESSION: Low lung volumes with bibasilar atelectasis or infiltrates, left greater than right. Cardiomegaly. Electronically Signed   By: Rolm Baptise M.D.   On: 03/03/2017 09:53   Ct Head Wo Contrast  Result Date: 03/15/2017 CLINICAL DATA:  Acute encephalopathy with progressive altered mental status over night. Nonresponsive currently. EXAM: CT HEAD WITHOUT CONTRAST TECHNIQUE: Contiguous axial images were obtained from the base of the skull through the vertex without intravenous contrast. COMPARISON:  03/05/2015. FINDINGS: Brain: Diffusely enlarged ventricles and subarachnoid spaces. Patchy white matter low density in both cerebral hemispheres. No intracranial hemorrhage, mass lesion or CT evidence of acute infarction. Vascular: No hyperdense vessel or unexpected calcification. Skull: Progressive right mastoid air cell opacification with resolved left mastoid air cell mucosal thickening. Sinuses/Orbits: Interval opacification of the right frontal and right anterior ethmoid sinuses. Left maxillary sinus mucosal thickening. Status post cataract removal on the left. Other: None. IMPRESSION: 1. No acute abnormality. 2. Mildly progressive atrophy and chronic small vessel white matter ischemic changes. 3. Chronic right frontal, right ethmoid and left maxillary sinusitis. 4. Progressive right mastoid air cell opacification. Electronically Signed   By: Claudie Revering M.D.   On: 03/15/2017 13:51   Dg Chest Port 1  View  Result Date: 03/18/2017 CLINICAL DATA:  Pulmonary vascular  congestion EXAM: PORTABLE CHEST 1 VIEW COMPARISON:  April 11, 2017 FINDINGS: Central catheter tip is in the superior vena cava. No pneumothorax. There is partially loculated pleural effusion on the left with atelectasis in the left base. There is linear atelectasis in the right base. There is no appreciable pulmonary edema. There is stable cardiomegaly with pulmonary venous hypertension. There is evidence of mitral annulus calcification. There is aortic atherosclerosis. No adenopathy. Multiple rib fractures noted on the left, unchanged. IMPRESSION: Pulmonary vascular congestion. Pleural effusion on the left, partially loculated, stable. Pulmonary vascular congestion without appreciable edema. There is bibasilar atelectasis. There are rib fractures on the left. The fracture of the lateral left fifth rib appears recent. No pneumothorax. The overall appearance of the chest is similar compared to most recent study. Electronically Signed   By: Lowella Grip III M.D.   On: 03/18/2017 07:43   Dg Chest Port 1 View  Result Date: 03/13/2017 CLINICAL DATA:  Cough.  Central line placement. EXAM: PORTABLE CHEST 1 VIEW COMPARISON:  Radiographs yesterday at 2031 hour FINDINGS: Right internal jugular central venous catheter tip is in the region of the mid SVC. No pneumothorax. Cardiomegaly is stable. Left pleural effusion and left lung opacities are stable from prior exam. Right infrahilar atelectasis. IMPRESSION: Tip of the right internal jugular central venous catheter in the SVC. No pneumothorax. Unchanged left pleural effusion and left lung opacities. Electronically Signed   By: Jeb Levering M.D.   On: 03/13/2017 01:42   Dg Chest Port 1 View  Result Date: 03/12/2017 CLINICAL DATA:  Dyspnea EXAM: PORTABLE CHEST 1 VIEW COMPARISON:  03/03/2017 chest radiograph. FINDINGS: Low lung volumes. Stable cardiomediastinal silhouette with cardiomegaly and aortic atherosclerosis. No pneumothorax. No right pleural effusion.  Small left pleural effusion, increased. Mild pulmonary edema. Bibasilar lung opacities, increased on the left. IMPRESSION: 1. Mild congestive heart failure, new. 2. Small left pleural effusion, increased . 3. Bibasilar lung opacities, increased on the left, favor atelectasis. 4. Aortic atherosclerosis. Electronically Signed   By: Ilona Sorrel M.D.   On: 03/12/2017 23:57       Today   Subjective:   [name]  unresponsive  Objective:   Blood pressure (!) 84/35, pulse (!) 113, temperature 98.2 F (36.8 C), resp. rate 14, height 5\' 1"  (1.549 m), weight 227 lb 1.2 oz (103 kg), SpO2 100 %.  .  Intake/Output Summary (Last 24 hours) at 03/20/17 1521 Last data filed at 03/19/17 1920  Gross per 24 hour  Intake                0 ml  Output                0 ml  Net                0 ml    Exam VITAL SIGNS: Blood pressure (!) 84/35, pulse (!) 113, temperature 98.2 F (36.8 C), resp. rate 14, height 5\' 1"  (1.549 m), weight 227 lb 1.2 oz (103 kg), SpO2 100 %.  GENERAL:  83 y.o.-year-old patient lying in the bed with no acute distress.  EYES: Pupils equal, round, reactive to light and accommodation. No scleral icterus. Extraocular muscles intact.  HEENT: Head atraumatic, normocephalic. Oropharynx and nasopharynx clear.  NECK:  Supple, no jugular venous distention. No thyroid enlargement, no tenderness.  LUNGS: Normal breath sounds bilaterally, no wheezing, rales,rhonchi or crepitation. No use of accessory muscles of respiration.  CARDIOVASCULAR: S1, S2 normal. No murmurs, rubs, or gallops.  ABDOMEN: Soft, nontender, nondistended. Bowel sounds present. No organomegaly or mass.  EXTREMITIES: No pedal edema, cyanosis, or clubbing.  NEUROLOGIC: unresponsive  PSYCHIATRIC: unresponsive SKIN: No obvious rash, lesion, or ulcer.   Data Review     CBC w Diff: Lab Results  Component Value Date   WBC 17.7 (H) 03/19/2017   HGB 10.2 (L) 03/19/2017   HGB 8.6 (L) 03/10/2015   HCT 31.7 (L) 03/19/2017    HCT 28.7 (L) 08/14/2015   PLT 113 (L) 03/19/2017   PLT 183 03/10/2015   LYMPHOPCT 9 03/18/2017   LYMPHOPCT 8.0 03/10/2015   BANDSPCT 2 09/12/2016   MONOPCT 10 03/18/2017   MONOPCT 10.7 03/10/2015   EOSPCT 0 03/18/2017   EOSPCT 2.1 03/10/2015   BASOPCT 0 03/18/2017   BASOPCT 0.3 03/10/2015   CMP: Lab Results  Component Value Date   NA 138 03/19/2017   NA 130 (L) 03/10/2015   K 3.3 (L) 03/19/2017   K 5.3 (H) 03/10/2015   CL 102 03/19/2017   CL 91 (L) 03/10/2015   CO2 26 03/19/2017   CO2 26 03/10/2015   BUN 12 03/19/2017   BUN 41 (H) 03/10/2015   CREATININE 2.22 (H) 03/19/2017   CREATININE 6.65 (H) 03/10/2015   PROT 5.4 (L) 09/27/2016   PROT 6.0 (L) 03/06/2015   ALBUMIN 2.6 (L) 09/27/2016   ALBUMIN 2.7 (L) 03/07/2015   BILITOT 1.5 (H) 09/27/2016   BILITOT 1.2 03/06/2015   ALKPHOS 148 (H) 09/27/2016   ALKPHOS 117 03/06/2015   AST 22 09/27/2016   AST 19 03/06/2015   ALT 11 (L) 09/27/2016   ALT 12 (L) 03/06/2015  .  Micro Results No results found for this or any previous visit (from the past 240 hour(s)).      Code Status Orders        Start     Ordered   03/19/17 1251  Do not attempt resuscitation (DNR)  Continuous    Question Answer Comment  In the event of cardiac or respiratory ARREST Do not call a "code blue"   In the event of cardiac or respiratory ARREST Do not perform Intubation, CPR, defibrillation or ACLS   In the event of cardiac or respiratory ARREST Use medication by any route, position, wound care, and other measures to relive pain and suffering. May use oxygen, suction and manual treatment of airway obstruction as needed for comfort.      03/19/17 1253    Code Status History    Date Active Date Inactive Code Status Order ID Comments User Context   03/10/2017  5:43 PM 03/19/2017 12:53 PM DNR [id number]  Hillary Bow, MD Inpatient   03/10/2017  4:49 PM 03/10/2017  5:43 PM Full Code [id number]  Algernon Huxley, MD Inpatient   09/26/2016  7:27 PM  09/28/2016  9:19 PM DNR [id number]  Idelle Crouch, MD Inpatient   09/11/2016 12:12 PM 09/21/2016  3:50 PM DNR [id number]  Laverle Hobby, MD Inpatient   09/10/2016 12:48 AM 09/10/2016  2:44 PM Full Code [id number]  Harvie Bridge, DO Inpatient   04/06/2016  4:59 PM 04/10/2016  7:57 PM Full Code [id number]  Epifanio Lesches, MD ED   11/20/2015  3:03 PM 11/20/2015  6:10 PM Full Code [id number]  Algernon Huxley, MD Inpatient   08/14/2015  5:40 AM 08/21/2015 10:45 PM Full Code [id number]  Juluis Mire, MD Inpatient   08/02/2015  1:44 PM 08/11/2015 10:21 PM Full  Code [id number]  Gladstone Lighter, MD Inpatient            Discharge Medications   Allergies as of 03/20/2017      Reactions   Angiotensin Receptor Blockers Other (See Comments)   Reaction:  Hyperkalemia   Penicillins Rash, Other (See Comments)   rash      Medication List    STOP taking these medications   allopurinol 100 MG tablet Commonly known as:  ZYLOPRIM   calcium acetate 667 MG capsule Commonly known as:  PHOSLO   cholecalciferol 1000 units tablet Commonly known as:  VITAMIN D   docusate sodium 100 MG capsule Commonly known as:  COLACE   fluticasone 50 MCG/ACT nasal spray Commonly known as:  FLONASE   folic acid-vitamin b complex-vitamin c-selenium-zinc 3 MG Tabs tablet   HYDROcodone-acetaminophen 5-325 MG tablet Commonly known as:  NORCO/VICODIN   insulin glargine 100 UNIT/ML injection Commonly known as:  LANTUS   ipratropium-albuterol 0.5-2.5 (3) MG/3ML Soln Commonly known as:  DUONEB   lidocaine-prilocaine cream Commonly known as:  EMLA   megestrol 40 MG tablet Commonly known as:  MEGACE   midodrine 10 MG tablet Commonly known as:  PROAMATINE   pantoprazole 40 MG tablet Commonly known as:  PROTONIX   promethazine 25 MG tablet Commonly known as:  PHENERGAN   propranolol 80 MG tablet Commonly known as:  INDERAL   RENVELA 800 MG tablet Generic drug:  sevelamer carbonate   senna 8.6 MG  Tabs tablet Commonly known as:  SENOKOT   temazepam 15 MG capsule Commonly known as:  RESTORIL   traMADol 50 MG tablet Commonly known as:  ULTRAM     TAKE these medications   glycopyrrolate 1 MG tablet Commonly known as:  ROBINUL Take 1 tablet (1 mg total) by mouth every 4 (four) hours as needed (excessive secretions).   LORazepam 1 MG tablet Commonly known as:  ATIVAN Take 1 tablet (1 mg total) by mouth every 30 (thirty) minutes as needed for anxiety.   morphine CONCENTRATE 10 MG/0.5ML Soln concentrated solution Take 0.25 mLs (5 mg total) by mouth every 2 (two) hours as needed for moderate pain (or dyspnea).          Total Time in preparing paper work, data evaluation and todays exam - 35 minutes  Dustin Flock M.D on 03/20/2017 at 3:21 PM  [facility]   Office  [phone]

## 2017-03-20 NOTE — Clinical Social Work Note (Signed)
Patient to dc to [facility] via non-emergent EMS. The patient's family and facility are aware and are in agreement. DC summary sent, and packet delivered. Facility requested that EMS not pick up patient for transport prior to 1720. CSW delivered this message to the RN verbally and on the packet. CSW will con't to follow pending any other dc needs.  Santiago Bumpers, MSW, Latanya Presser  [phone]

## 2017-03-20 NOTE — Progress Notes (Signed)
Assumed care at [place]. Pt resting, eyes closed. No signs or symptoms of discomfort or distress noted. Spouse is at bedside, no needs expressed. Dressing to left BKA is dry and intact. Oxygen is at 3L via [place]. Pt is a high fall risk with exit alarm activated.

## 2017-03-20 NOTE — Care Management Note (Signed)
Case Management Note  Patient Details  Name: Aloma Boch MRN: [id number] Date of Birth: 1934-08-22  Subjective/Objective:     Call to Lorelle Formosa at [facility] updating her that Mrs Hartner family is now requesting admission to the [facility]. Greig Right CSW is arranging a transfer to the hospice facility.                Action/Plan:   Expected Discharge Date:                  Expected Discharge Plan:     In-House Referral:     Discharge planning Services  CM Consult  Post Acute Care Choice:    Choice offered to:     DME Arranged:    DME Agency:     HH Arranged:    HH Agency:     Status of Service:     If discussed at [employer] of Stay Meetings, dates discussed:    Additional Comments:  Shatarra Wehling A, RN 03/20/2017, 1:17 PM

## 2017-03-22 ENCOUNTER — Ambulatory Visit: Payer: Medicare Other | Admitting: Internal Medicine

## 2017-04-12 DEATH — deceased
